# Patient Record
Sex: Female | Born: 1955 | Race: Black or African American | Hispanic: No | Marital: Married | Smoking: Never smoker
Health system: Southern US, Community
[De-identification: ages and names within clinical notes are randomized; demographics above are authoritative.]

## PROBLEM LIST (undated history)

## (undated) DIAGNOSIS — I219 Acute myocardial infarction, unspecified: Secondary | ICD-10-CM

## (undated) DIAGNOSIS — E785 Hyperlipidemia, unspecified: Secondary | ICD-10-CM

## (undated) DIAGNOSIS — I10 Essential (primary) hypertension: Secondary | ICD-10-CM

## (undated) DIAGNOSIS — I2510 Atherosclerotic heart disease of native coronary artery without angina pectoris: Secondary | ICD-10-CM

## (undated) DIAGNOSIS — E119 Type 2 diabetes mellitus without complications: Secondary | ICD-10-CM

## (undated) DIAGNOSIS — I499 Cardiac arrhythmia, unspecified: Secondary | ICD-10-CM

## (undated) DIAGNOSIS — I4891 Unspecified atrial fibrillation: Secondary | ICD-10-CM

## (undated) DIAGNOSIS — I509 Heart failure, unspecified: Secondary | ICD-10-CM

## (undated) HISTORY — PX: TUBAL LIGATION: SHX77

---

## 1999-07-18 ENCOUNTER — Emergency Department (HOSPITAL_COMMUNITY): Admission: EM | Admit: 1999-07-18 | Discharge: 1999-07-18 | Payer: Self-pay | Admitting: Emergency Medicine

## 2000-01-05 ENCOUNTER — Encounter: Admission: RE | Admit: 2000-01-05 | Discharge: 2000-01-05 | Payer: Self-pay | Admitting: Family Medicine

## 2000-01-05 ENCOUNTER — Encounter: Payer: Self-pay | Admitting: Family Medicine

## 2001-01-08 ENCOUNTER — Encounter: Admission: RE | Admit: 2001-01-08 | Discharge: 2001-01-08 | Payer: Self-pay | Admitting: Family Medicine

## 2001-01-08 ENCOUNTER — Encounter: Payer: Self-pay | Admitting: Family Medicine

## 2002-01-24 ENCOUNTER — Encounter: Admission: RE | Admit: 2002-01-24 | Discharge: 2002-01-24 | Payer: Self-pay | Admitting: Family Medicine

## 2002-01-24 ENCOUNTER — Other Ambulatory Visit: Admission: RE | Admit: 2002-01-24 | Discharge: 2002-01-24 | Payer: Self-pay | Admitting: Family Medicine

## 2002-01-24 ENCOUNTER — Encounter: Payer: Self-pay | Admitting: Family Medicine

## 2002-11-19 ENCOUNTER — Encounter: Admission: RE | Admit: 2002-11-19 | Discharge: 2003-02-17 | Payer: Self-pay | Admitting: *Deleted

## 2003-04-07 ENCOUNTER — Encounter: Admission: RE | Admit: 2003-04-07 | Discharge: 2003-07-06 | Payer: Self-pay | Admitting: *Deleted

## 2003-05-29 ENCOUNTER — Encounter: Admission: RE | Admit: 2003-05-29 | Discharge: 2003-05-29 | Payer: Self-pay | Admitting: Family Medicine

## 2003-05-29 ENCOUNTER — Encounter: Payer: Self-pay | Admitting: Family Medicine

## 2004-06-22 ENCOUNTER — Encounter: Admission: RE | Admit: 2004-06-22 | Discharge: 2004-06-22 | Payer: Self-pay | Admitting: Family Medicine

## 2005-02-16 ENCOUNTER — Other Ambulatory Visit: Admission: RE | Admit: 2005-02-16 | Discharge: 2005-02-16 | Payer: Self-pay | Admitting: Family Medicine

## 2005-09-12 ENCOUNTER — Encounter: Admission: RE | Admit: 2005-09-12 | Discharge: 2005-09-12 | Payer: Self-pay | Admitting: Family Medicine

## 2006-09-12 ENCOUNTER — Encounter: Admission: RE | Admit: 2006-09-12 | Discharge: 2006-09-12 | Payer: Self-pay | Admitting: Family Medicine

## 2007-09-17 ENCOUNTER — Encounter: Admission: RE | Admit: 2007-09-17 | Discharge: 2007-09-17 | Payer: Self-pay | Admitting: Family Medicine

## 2008-09-08 ENCOUNTER — Encounter: Admission: RE | Admit: 2008-09-08 | Discharge: 2008-09-08 | Payer: Self-pay | Admitting: Family Medicine

## 2009-09-13 ENCOUNTER — Encounter: Admission: RE | Admit: 2009-09-13 | Discharge: 2009-09-13 | Payer: Self-pay | Admitting: Family Medicine

## 2010-01-13 ENCOUNTER — Other Ambulatory Visit: Admission: RE | Admit: 2010-01-13 | Discharge: 2010-01-13 | Payer: Self-pay | Admitting: Family Medicine

## 2010-09-14 ENCOUNTER — Encounter: Admission: RE | Admit: 2010-09-14 | Discharge: 2010-09-14 | Payer: Self-pay | Admitting: Family Medicine

## 2011-06-23 ENCOUNTER — Other Ambulatory Visit: Payer: Self-pay | Admitting: Gastroenterology

## 2011-08-11 ENCOUNTER — Other Ambulatory Visit: Payer: Self-pay | Admitting: Family Medicine

## 2011-08-11 DIAGNOSIS — Z1231 Encounter for screening mammogram for malignant neoplasm of breast: Secondary | ICD-10-CM

## 2011-09-15 ENCOUNTER — Ambulatory Visit: Payer: Self-pay

## 2011-09-22 ENCOUNTER — Ambulatory Visit
Admission: RE | Admit: 2011-09-22 | Discharge: 2011-09-22 | Disposition: A | Discharge status: Home / Self Care | Payer: Managed Care, Other (non HMO) | Source: Ambulatory Visit | Attending: Family Medicine | Admitting: Family Medicine

## 2011-09-22 DIAGNOSIS — Z1231 Encounter for screening mammogram for malignant neoplasm of breast: Secondary | ICD-10-CM

## 2012-12-09 ENCOUNTER — Other Ambulatory Visit: Payer: Self-pay | Admitting: Family Medicine

## 2012-12-09 DIAGNOSIS — Z1231 Encounter for screening mammogram for malignant neoplasm of breast: Secondary | ICD-10-CM

## 2012-12-23 ENCOUNTER — Ambulatory Visit
Admission: RE | Admit: 2012-12-23 | Discharge: 2012-12-23 | Disposition: A | Discharge status: Home / Self Care | Payer: Managed Care, Other (non HMO) | Source: Ambulatory Visit | Attending: Family Medicine | Admitting: Family Medicine

## 2012-12-23 DIAGNOSIS — Z1231 Encounter for screening mammogram for malignant neoplasm of breast: Secondary | ICD-10-CM

## 2013-12-24 ENCOUNTER — Other Ambulatory Visit: Payer: Self-pay

## 2013-12-24 DIAGNOSIS — Z1231 Encounter for screening mammogram for malignant neoplasm of breast: Secondary | ICD-10-CM

## 2014-01-15 ENCOUNTER — Inpatient Hospital Stay: Admission: RE | Admit: 2014-01-15 | Payer: Managed Care, Other (non HMO) | Source: Ambulatory Visit

## 2014-03-20 ENCOUNTER — Ambulatory Visit
Admission: RE | Admit: 2014-03-20 | Discharge: 2014-03-20 | Disposition: A | Discharge status: Home / Self Care | Payer: Managed Care, Other (non HMO) | Source: Ambulatory Visit

## 2014-03-20 DIAGNOSIS — Z1231 Encounter for screening mammogram for malignant neoplasm of breast: Secondary | ICD-10-CM

## 2014-03-25 ENCOUNTER — Other Ambulatory Visit: Payer: Self-pay | Admitting: Family Medicine

## 2014-03-25 DIAGNOSIS — R928 Other abnormal and inconclusive findings on diagnostic imaging of breast: Secondary | ICD-10-CM

## 2014-04-02 ENCOUNTER — Ambulatory Visit
Admission: RE | Admit: 2014-04-02 | Discharge: 2014-04-02 | Disposition: A | Discharge status: Home / Self Care | Payer: Managed Care, Other (non HMO) | Source: Ambulatory Visit | Attending: Family Medicine | Admitting: Family Medicine

## 2014-04-02 DIAGNOSIS — R928 Other abnormal and inconclusive findings on diagnostic imaging of breast: Secondary | ICD-10-CM

## 2015-04-19 ENCOUNTER — Other Ambulatory Visit: Payer: Self-pay

## 2015-04-19 DIAGNOSIS — Z1231 Encounter for screening mammogram for malignant neoplasm of breast: Secondary | ICD-10-CM

## 2015-05-04 ENCOUNTER — Ambulatory Visit
Admission: RE | Admit: 2015-05-04 | Discharge: 2015-05-04 | Disposition: A | Discharge status: Home / Self Care | Payer: 59 | Source: Ambulatory Visit

## 2015-05-04 DIAGNOSIS — Z1231 Encounter for screening mammogram for malignant neoplasm of breast: Secondary | ICD-10-CM

## 2016-03-27 ENCOUNTER — Other Ambulatory Visit: Payer: Self-pay

## 2016-03-27 DIAGNOSIS — Z1231 Encounter for screening mammogram for malignant neoplasm of breast: Secondary | ICD-10-CM

## 2016-05-04 ENCOUNTER — Ambulatory Visit
Admission: RE | Admit: 2016-05-04 | Discharge: 2016-05-04 | Disposition: A | Discharge status: Home / Self Care | Payer: Managed Care, Other (non HMO) | Source: Ambulatory Visit

## 2016-05-04 DIAGNOSIS — Z1231 Encounter for screening mammogram for malignant neoplasm of breast: Secondary | ICD-10-CM

## 2016-11-09 ENCOUNTER — Emergency Department (HOSPITAL_COMMUNITY): Payer: Managed Care, Other (non HMO)

## 2016-11-09 ENCOUNTER — Emergency Department (HOSPITAL_COMMUNITY)
Admission: EM | Admit: 2016-11-09 | Discharge: 2016-11-09 | Disposition: A | Discharge status: Home / Self Care | Payer: Managed Care, Other (non HMO) | Attending: Emergency Medicine | Admitting: Emergency Medicine

## 2016-11-09 ENCOUNTER — Encounter (HOSPITAL_COMMUNITY): Payer: Self-pay | Admitting: Emergency Medicine

## 2016-11-09 DIAGNOSIS — I10 Essential (primary) hypertension: Secondary | ICD-10-CM | POA: Insufficient documentation

## 2016-11-09 DIAGNOSIS — Y939 Activity, unspecified: Secondary | ICD-10-CM | POA: Insufficient documentation

## 2016-11-09 DIAGNOSIS — Y999 Unspecified external cause status: Secondary | ICD-10-CM | POA: Diagnosis not present

## 2016-11-09 DIAGNOSIS — W2201XA Walked into wall, initial encounter: Secondary | ICD-10-CM | POA: Insufficient documentation

## 2016-11-09 DIAGNOSIS — E119 Type 2 diabetes mellitus without complications: Secondary | ICD-10-CM | POA: Diagnosis not present

## 2016-11-09 DIAGNOSIS — S9031XA Contusion of right foot, initial encounter: Secondary | ICD-10-CM

## 2016-11-09 DIAGNOSIS — Y929 Unspecified place or not applicable: Secondary | ICD-10-CM | POA: Diagnosis not present

## 2016-11-09 DIAGNOSIS — S99921A Unspecified injury of right foot, initial encounter: Secondary | ICD-10-CM | POA: Diagnosis present

## 2016-11-09 HISTORY — DX: Essential (primary) hypertension: I10

## 2016-11-09 HISTORY — DX: Type 2 diabetes mellitus without complications: E11.9

## 2016-11-09 MED ORDER — ONDANSETRON 8 MG PO TBDP: 8.0000 mg | ORAL_TABLET | Freq: Once | ORAL | Status: DC

## 2016-11-09 MED ORDER — IBUPROFEN 800 MG PO TABS: 800.0000 mg | ORAL_TABLET | Freq: Three times a day (TID) | ORAL | 0 refills | Status: DC | PRN

## 2016-11-09 NOTE — Discharge Instructions (Signed)
Return here as needed.  Follow-up with your primary care doctor.  The x-rays did not show any broken bones.  Ice and elevate your foot

## 2016-11-09 NOTE — ED Provider Notes (Signed)
[facility]-EMERGENCY DEPT Provider Note   CSN: [id number] Arrival date & time: 11/09/16  1716  By signing my name below, I, Clovis Pu, attest that this documentation has been prepared under the direction and in the presence of  [place], PA-C. Electronically Signed: Clovis Pu, ED Scribe. 11/09/16. 6:08 PM.  History   Chief Complaint Chief Complaint  Patient presents with  . Foot Pain    Right    The history is provided by the patient. No language interpreter was used.   HPI Comments:  Rachael Davis is a 60 y.o. female, with a hx of DM and HTN, who presents to the Emergency Department complaining of sudden onset, moderate right foot pain s/p a fall which occurred yesterday. Pt states she fell and hit her right foot against a wall. Her pain is worse upon palpation. No alleviating factors noted. Pt denies any other associated symptoms and modifying factors at this time.   Past Medical History:  Diagnosis Date  . Diabetes (HCC)   . Hypertension     There are no active problems to display for this patient.   Past Surgical History:  Procedure Laterality Date  . TUBAL LIGATION      OB History    No data available       Home Medications    Prior to Admission medications   Not on File    Family History History reviewed. No pertinent family history.  Social History Social History  Substance Use Topics  . Smoking status: Never Smoker  . Smokeless tobacco: Never Used  . Alcohol use No     Allergies   Patient has no allergy information on record.   Review of Systems Review of Systems  Musculoskeletal: Positive for arthralgias, joint swelling and myalgias.  Neurological: Negative for numbness.   Physical Exam Updated Vital Signs BP 148/92 (BP Location: Left Arm)   Pulse 88   Temp 99 F (37.2 C) (Oral)   Resp 18   Ht 5\' 3"  (1.6 m)   Wt 204 lb (92.5 kg)   SpO2 100%   BMI 36.14 kg/m   Physical Exam  Constitutional: She is oriented to person,  place, and time. She appears well-developed and well-nourished. No distress.  HENT:  Head: Normocephalic and atraumatic.  Eyes: Conjunctivae are normal.  Cardiovascular: Normal rate.   Pulmonary/Chest: Effort normal.  Abdominal: She exhibits no distension.  Musculoskeletal: She exhibits edema and tenderness.  Swelling at base of 4th and 5th toes with TTP of dorsum of foot.   Neurological: She is alert and oriented to person, place, and time.  Skin: Skin is warm and dry.  Psychiatric: She has a normal mood and affect.  Nursing note and vitals reviewed.    ED Treatments / Results  DIAGNOSTIC STUDIES:  Oxygen Saturation is 100% on RA, normal by my interpretation.    COORDINATION OF CARE:  6:01 PM Discussed treatment plan with pt at bedside and pt agreed to plan.  Labs (all labs ordered are listed, but only abnormal results are displayed) Labs Reviewed - No data to display  EKG  EKG Interpretation None       Radiology No results found.  Procedures Procedures (including critical care time)  Medications Ordered in ED Medications - No data to display   Initial Impression / Assessment and Plan / ED Course  I have reviewed the triage vital signs and the nursing notes.  Pertinent labs & imaging results that were available during my care of  the patient were reviewed by me and considered in my medical decision making (see chart for details).  Clinical Course     Patient X-Ray negative for obvious fracture or dislocation.  Pt advised to follow up with orthopedics. Patient given ace wrap while in ED, conservative therapy recommended and discussed. Patient will be discharged home & is agreeable with above plan. Returns precautions discussed. Pt appears safe for discharge.  Final Clinical Impressions(s) / ED Diagnoses   Final diagnoses:  None    New Prescriptions New Prescriptions   No medications on file  I personally performed the services described in this  documentation, which was scribed in my presence. The recorded information has been reviewed and is accurate.     Charlestine NightChristopher Ryosuke Ericksen, PA-C 11/09/16 1936    Jacalyn LefevreJulie Haviland, MD 11/09/16 [phone]

## 2016-11-09 NOTE — ED Notes (Signed)
Patient transported to X-ray 

## 2016-11-09 NOTE — ED Triage Notes (Signed)
Pt c/o R. Sided foot pain. States she hit it against a wall last Tuesday. Endorses taking Advil w/ no relief. Pt able to ambulate to triage. NAD noted.

## 2017-04-04 ENCOUNTER — Emergency Department (HOSPITAL_COMMUNITY): Payer: Managed Care, Other (non HMO)

## 2017-04-04 ENCOUNTER — Inpatient Hospital Stay (HOSPITAL_COMMUNITY)
Admission: EM | Admit: 2017-04-04 | Discharge: 2017-04-21 | DRG: 216 | Disposition: A | Discharge status: Home Health | Payer: Managed Care, Other (non HMO) | Attending: Cardiothoracic Surgery | Admitting: Cardiothoracic Surgery

## 2017-04-04 ENCOUNTER — Encounter (HOSPITAL_COMMUNITY)
Admission: EM | Disposition: A | Discharge status: Home Health | Payer: Self-pay | Source: Home / Self Care | Attending: Cardiothoracic Surgery

## 2017-04-04 ENCOUNTER — Encounter (HOSPITAL_COMMUNITY): Payer: Self-pay | Admitting: Emergency Medicine

## 2017-04-04 DIAGNOSIS — I2510 Atherosclerotic heart disease of native coronary artery without angina pectoris: Secondary | ICD-10-CM | POA: Diagnosis present

## 2017-04-04 DIAGNOSIS — Z8249 Family history of ischemic heart disease and other diseases of the circulatory system: Secondary | ICD-10-CM

## 2017-04-04 DIAGNOSIS — Z79899 Other long term (current) drug therapy: Secondary | ICD-10-CM | POA: Diagnosis not present

## 2017-04-04 DIAGNOSIS — R05 Cough: Secondary | ICD-10-CM | POA: Diagnosis not present

## 2017-04-04 DIAGNOSIS — E1169 Type 2 diabetes mellitus with other specified complication: Secondary | ICD-10-CM | POA: Diagnosis present

## 2017-04-04 DIAGNOSIS — Z01810 Encounter for preprocedural cardiovascular examination: Secondary | ICD-10-CM | POA: Diagnosis not present

## 2017-04-04 DIAGNOSIS — Z951 Presence of aortocoronary bypass graft: Secondary | ICD-10-CM

## 2017-04-04 DIAGNOSIS — I10 Essential (primary) hypertension: Secondary | ICD-10-CM | POA: Diagnosis present

## 2017-04-04 DIAGNOSIS — E11649 Type 2 diabetes mellitus with hypoglycemia without coma: Secondary | ICD-10-CM | POA: Diagnosis not present

## 2017-04-04 DIAGNOSIS — I5043 Acute on chronic combined systolic (congestive) and diastolic (congestive) heart failure: Secondary | ICD-10-CM | POA: Diagnosis not present

## 2017-04-04 DIAGNOSIS — Z7984 Long term (current) use of oral hypoglycemic drugs: Secondary | ICD-10-CM | POA: Diagnosis not present

## 2017-04-04 DIAGNOSIS — J9811 Atelectasis: Secondary | ICD-10-CM

## 2017-04-04 DIAGNOSIS — I255 Ischemic cardiomyopathy: Secondary | ICD-10-CM | POA: Diagnosis present

## 2017-04-04 DIAGNOSIS — I110 Hypertensive heart disease with heart failure: Secondary | ICD-10-CM | POA: Diagnosis not present

## 2017-04-04 DIAGNOSIS — I959 Hypotension, unspecified: Secondary | ICD-10-CM | POA: Diagnosis not present

## 2017-04-04 DIAGNOSIS — E785 Hyperlipidemia, unspecified: Secondary | ICD-10-CM | POA: Diagnosis present

## 2017-04-04 DIAGNOSIS — Z23 Encounter for immunization: Secondary | ICD-10-CM | POA: Diagnosis not present

## 2017-04-04 DIAGNOSIS — E1121 Type 2 diabetes mellitus with diabetic nephropathy: Secondary | ICD-10-CM | POA: Diagnosis present

## 2017-04-04 DIAGNOSIS — I2121 ST elevation (STEMI) myocardial infarction involving left circumflex coronary artery: Secondary | ICD-10-CM | POA: Diagnosis present

## 2017-04-04 DIAGNOSIS — D72829 Elevated white blood cell count, unspecified: Secondary | ICD-10-CM | POA: Diagnosis not present

## 2017-04-04 DIAGNOSIS — I340 Nonrheumatic mitral (valve) insufficiency: Secondary | ICD-10-CM | POA: Diagnosis present

## 2017-04-04 DIAGNOSIS — Z952 Presence of prosthetic heart valve: Secondary | ICD-10-CM

## 2017-04-04 DIAGNOSIS — Z6837 Body mass index (BMI) 37.0-37.9, adult: Secondary | ICD-10-CM

## 2017-04-04 DIAGNOSIS — E876 Hypokalemia: Secondary | ICD-10-CM | POA: Diagnosis not present

## 2017-04-04 DIAGNOSIS — I213 ST elevation (STEMI) myocardial infarction of unspecified site: Secondary | ICD-10-CM | POA: Diagnosis not present

## 2017-04-04 DIAGNOSIS — N170 Acute kidney failure with tubular necrosis: Secondary | ICD-10-CM | POA: Diagnosis not present

## 2017-04-04 DIAGNOSIS — R079 Chest pain, unspecified: Secondary | ICD-10-CM

## 2017-04-04 DIAGNOSIS — E669 Obesity, unspecified: Secondary | ICD-10-CM | POA: Diagnosis present

## 2017-04-04 DIAGNOSIS — I25110 Atherosclerotic heart disease of native coronary artery with unstable angina pectoris: Secondary | ICD-10-CM | POA: Diagnosis not present

## 2017-04-04 DIAGNOSIS — I459 Conduction disorder, unspecified: Secondary | ICD-10-CM | POA: Diagnosis not present

## 2017-04-04 DIAGNOSIS — I5041 Acute combined systolic (congestive) and diastolic (congestive) heart failure: Secondary | ICD-10-CM | POA: Diagnosis not present

## 2017-04-04 DIAGNOSIS — E7800 Pure hypercholesterolemia, unspecified: Secondary | ICD-10-CM | POA: Diagnosis not present

## 2017-04-04 DIAGNOSIS — R0602 Shortness of breath: Secondary | ICD-10-CM

## 2017-04-04 HISTORY — PX: LEFT HEART CATH AND CORONARY ANGIOGRAPHY: CATH118249

## 2017-04-04 LAB — CBC
HCT: 41.8 % (ref 36.0–46.0)
Hemoglobin: 13.5 g/dL (ref 12.0–15.0)
MCH: 23.9 pg — ABNORMAL LOW (ref 26.0–34.0)
MCHC: 32.3 g/dL (ref 30.0–36.0)
MCV: 74.1 fL — ABNORMAL LOW (ref 78.0–100.0)
Platelets: 199 10*3/uL (ref 150–400)
RBC: 5.64 MIL/uL — ABNORMAL HIGH (ref 3.87–5.11)
RDW: 16.2 % — ABNORMAL HIGH (ref 11.5–15.5)
WBC: 12 10*3/uL — ABNORMAL HIGH (ref 4.0–10.5)

## 2017-04-04 LAB — BASIC METABOLIC PANEL
Anion gap: 12 (ref 5–15)
BUN: 25 mg/dL — ABNORMAL HIGH (ref 6–20)
CO2: 25 mmol/L (ref 22–32)
Calcium: 9.5 mg/dL (ref 8.9–10.3)
Chloride: 99 mmol/L — ABNORMAL LOW (ref 101–111)
Creatinine, Ser: 1.6 mg/dL — ABNORMAL HIGH (ref 0.44–1.00)
GFR calc Af Amer: 39 mL/min — ABNORMAL LOW (ref >60)
GFR calc non Af Amer: 34 mL/min — ABNORMAL LOW (ref >60)
Glucose, Bld: 209 mg/dL — ABNORMAL HIGH (ref 65–99)
Potassium: 4.5 mmol/L (ref 3.5–5.1)
Sodium: 136 mmol/L (ref 135–145)

## 2017-04-04 LAB — LIPID PANEL
Cholesterol: 181 mg/dL (ref 0–200)
HDL: 55 mg/dL (ref >40)
LDL Cholesterol: 115 mg/dL — ABNORMAL HIGH (ref 0–99)
Total CHOL/HDL Ratio: 3.3 RATIO
Triglycerides: 56 mg/dL (ref <150)
VLDL: 11 mg/dL (ref 0–40)

## 2017-04-04 LAB — APTT: aPTT: 27 seconds (ref 24–36)

## 2017-04-04 LAB — GLUCOSE, CAPILLARY: Glucose-Capillary: 188 mg/dL — ABNORMAL HIGH (ref 65–99)

## 2017-04-04 LAB — I-STAT TROPONIN, ED: Troponin i, poc: 0.5 ng/mL (ref 0.00–0.08)

## 2017-04-04 LAB — TROPONIN I: Troponin I: 0.6 ng/mL (ref <0.03)

## 2017-04-04 LAB — PROTIME-INR
INR: 1.05
Prothrombin Time: 13.7 seconds (ref 11.4–15.2)

## 2017-04-04 SURGERY — LEFT HEART CATH AND CORONARY ANGIOGRAPHY
Anesthesia: LOCAL

## 2017-04-04 MED ORDER — HEPARIN SODIUM (PORCINE) 5000 UNIT/ML IJ SOLN
4000.0000 [IU] | Freq: Once | INTRAMUSCULAR | Status: AC
  Administered 2017-04-04: 4000 [IU] via INTRAVENOUS
  Filled 2017-04-04: qty 1

## 2017-04-04 MED ORDER — NITROGLYCERIN 0.4 MG SL SUBL
0.4000 mg | SUBLINGUAL_TABLET | SUBLINGUAL | Status: DC | PRN
  Administered 2017-04-04: 0.4 mg via SUBLINGUAL
  Filled 2017-04-04: qty 1

## 2017-04-04 MED ORDER — FENTANYL CITRATE (PF) 100 MCG/2ML IJ SOLN
INTRAMUSCULAR | Status: AC
  Filled 2017-04-04: qty 2

## 2017-04-04 MED ORDER — HEPARIN (PORCINE) IN NACL 2-0.9 UNIT/ML-% IJ SOLN
INTRAMUSCULAR | Status: DC | PRN
  Administered 2017-04-04: 1000 mL

## 2017-04-04 MED ORDER — FENTANYL CITRATE (PF) 100 MCG/2ML IJ SOLN
INTRAMUSCULAR | Status: DC | PRN
  Administered 2017-04-04: 25 ug via INTRAVENOUS

## 2017-04-04 MED ORDER — IOPAMIDOL (ISOVUE-370) INJECTION 76%
INTRAVENOUS | Status: DC | PRN
  Administered 2017-04-04: 75 mL via INTRA_ARTERIAL

## 2017-04-04 MED ORDER — VERAPAMIL HCL 2.5 MG/ML IV SOLN
INTRAVENOUS | Status: AC
  Filled 2017-04-04: qty 2

## 2017-04-04 MED ORDER — MIDAZOLAM HCL 2 MG/2ML IJ SOLN
INTRAMUSCULAR | Status: DC | PRN
  Administered 2017-04-04: 1 mg via INTRAVENOUS

## 2017-04-04 MED ORDER — ASPIRIN 81 MG PO CHEW: 81.0000 mg | CHEWABLE_TABLET | Freq: Every day | ORAL | Status: DC

## 2017-04-04 MED ORDER — NITROGLYCERIN 0.4 MG SL SUBL: 0.4000 mg | SUBLINGUAL_TABLET | SUBLINGUAL | Status: DC | PRN

## 2017-04-04 MED ORDER — LIDOCAINE HCL (PF) 1 % IJ SOLN
INTRAMUSCULAR | Status: DC | PRN
  Administered 2017-04-04: 2 mL

## 2017-04-04 MED ORDER — LIDOCAINE HCL (PF) 1 % IJ SOLN
INTRAMUSCULAR | Status: AC
  Filled 2017-04-04: qty 30

## 2017-04-04 MED ORDER — ONDANSETRON HCL 4 MG/2ML IJ SOLN
4.0000 mg | Freq: Four times a day (QID) | INTRAMUSCULAR | Status: DC | PRN
  Administered 2017-04-05: 4 mg via INTRAVENOUS
  Filled 2017-04-04: qty 2

## 2017-04-04 MED ORDER — SODIUM CHLORIDE 0.9 % WEIGHT BASED INFUSION: 1.0000 mL/kg/h | INTRAVENOUS | Status: AC

## 2017-04-04 MED ORDER — ASPIRIN EC 81 MG PO TBEC
81.0000 mg | DELAYED_RELEASE_TABLET | Freq: Every day | ORAL | Status: DC
  Administered 2017-04-05 – 2017-04-11 (×7): 81 mg via ORAL
  Filled 2017-04-04 (×7): qty 1

## 2017-04-04 MED ORDER — ASPIRIN 81 MG PO CHEW
324.0000 mg | CHEWABLE_TABLET | Freq: Once | ORAL | Status: AC
  Administered 2017-04-04: 324 mg via ORAL
  Filled 2017-04-04: qty 4

## 2017-04-04 MED ORDER — INSULIN ASPART 100 UNIT/ML ~~LOC~~ SOLN
0.0000 [IU] | Freq: Three times a day (TID) | SUBCUTANEOUS | Status: DC
  Administered 2017-04-05: 3 [IU] via SUBCUTANEOUS
  Administered 2017-04-05: 5 [IU] via SUBCUTANEOUS
  Administered 2017-04-05: 2 [IU] via SUBCUTANEOUS
  Administered 2017-04-06: 5 [IU] via SUBCUTANEOUS
  Administered 2017-04-06: 2 [IU] via SUBCUTANEOUS
  Administered 2017-04-06: 3 [IU] via SUBCUTANEOUS
  Administered 2017-04-07: 2 [IU] via SUBCUTANEOUS
  Administered 2017-04-07: 5 [IU] via SUBCUTANEOUS
  Administered 2017-04-08: 2 [IU] via SUBCUTANEOUS
  Administered 2017-04-09 – 2017-04-10 (×5): 3 [IU] via SUBCUTANEOUS
  Administered 2017-04-11 (×3): 2 [IU] via SUBCUTANEOUS

## 2017-04-04 MED ORDER — MIDAZOLAM HCL 2 MG/2ML IJ SOLN
INTRAMUSCULAR | Status: AC
  Filled 2017-04-04: qty 2

## 2017-04-04 MED ORDER — SODIUM CHLORIDE 0.9 % IV SOLN
INTRAVENOUS | Status: DC
  Administered 2017-04-04 – 2017-04-06 (×3): via INTRAVENOUS

## 2017-04-04 MED ORDER — ATORVASTATIN CALCIUM 80 MG PO TABS
80.0000 mg | ORAL_TABLET | Freq: Every day | ORAL | Status: DC
  Administered 2017-04-05 – 2017-04-11 (×7): 80 mg via ORAL
  Filled 2017-04-04 (×7): qty 1

## 2017-04-04 MED ORDER — HEPARIN (PORCINE) IN NACL 2-0.9 UNIT/ML-% IJ SOLN
INTRAMUSCULAR | Status: AC
  Filled 2017-04-04: qty 1000

## 2017-04-04 MED ORDER — ASPIRIN 300 MG RE SUPP: 300.0000 mg | RECTAL | Status: DC

## 2017-04-04 MED ORDER — METOPROLOL TARTRATE 12.5 MG HALF TABLET: 25.0000 mg | ORAL_TABLET | Freq: Two times a day (BID) | ORAL | Status: DC

## 2017-04-04 MED ORDER — ASPIRIN 81 MG PO CHEW: 324.0000 mg | CHEWABLE_TABLET | ORAL | Status: DC

## 2017-04-04 MED ORDER — SODIUM CHLORIDE 0.9 % IV SOLN
250.0000 mL | INTRAVENOUS | Status: DC | PRN
Start: 2017-04-05 — End: 2017-04-12

## 2017-04-04 MED ORDER — NITROGLYCERIN 1 MG/10 ML FOR IR/CATH LAB
INTRA_ARTERIAL | Status: AC
  Filled 2017-04-04: qty 10

## 2017-04-04 MED ORDER — VERAPAMIL HCL 2.5 MG/ML IV SOLN
INTRAVENOUS | Status: DC | PRN
  Administered 2017-04-04: 10 mL via INTRA_ARTERIAL

## 2017-04-04 MED ORDER — HEPARIN (PORCINE) IN NACL 100-0.45 UNIT/ML-% IJ SOLN
1000.0000 [IU]/h | INTRAMUSCULAR | Status: DC
  Administered 2017-04-05: 1000 [IU]/h via INTRAVENOUS
  Filled 2017-04-04: qty 250

## 2017-04-04 MED ORDER — ACETAMINOPHEN 325 MG PO TABS: 650.0000 mg | ORAL_TABLET | ORAL | Status: DC | PRN

## 2017-04-04 MED ORDER — SODIUM CHLORIDE 0.9% FLUSH
3.0000 mL | Freq: Two times a day (BID) | INTRAVENOUS | Status: DC
  Administered 2017-04-05 – 2017-04-11 (×12): 3 mL via INTRAVENOUS

## 2017-04-04 MED ORDER — SODIUM CHLORIDE 0.9% FLUSH
3.0000 mL | INTRAVENOUS | Status: DC | PRN
  Administered 2017-04-07: 3 mL via INTRAVENOUS
  Filled 2017-04-04: qty 3

## 2017-04-04 SURGICAL SUPPLY — 11 items
CATH 5FR JL3.5 JR4 ANG PIG MP (CATHETERS) ×1 IMPLANT
DEVICE RAD COMP TR BAND LRG (VASCULAR PRODUCTS) ×1 IMPLANT
GLIDESHEATH SLEND SS 6F .021 (SHEATH) ×1 IMPLANT
GUIDEWIRE INQWIRE 1.5J.035X260 (WIRE) IMPLANT
INQWIRE 1.5J .035X260CM (WIRE) ×2
KIT ENCORE 26 ADVANTAGE (KITS) ×1 IMPLANT
KIT HEART LEFT (KITS) ×2 IMPLANT
PACK CARDIAC CATHETERIZATION (CUSTOM PROCEDURE TRAY) ×2 IMPLANT
SYR MEDRAD MARK V 150ML (SYRINGE) ×2 IMPLANT
TRANSDUCER W/STOPCOCK (MISCELLANEOUS) ×2 IMPLANT
TUBING CIL FLEX 10 FLL-RA (TUBING) ×2 IMPLANT

## 2017-04-04 NOTE — ED Triage Notes (Addendum)
Pt transported from , onset of SHOB with dizziness while at work. Pt then began having burning in chest with no radiation onset 1730. EMS unable to est IV.  While at home pt had emesis x 2.  Room air 02 99%

## 2017-04-04 NOTE — ED Notes (Signed)
MD issacs and Cardiologist currently at the bedside.

## 2017-04-04 NOTE — ED Notes (Signed)
Pt continues to deny pain/burning

## 2017-04-04 NOTE — Progress Notes (Signed)
ANTICOAGULATION CONSULT NOTE - Initial Consult  Pharmacy Consult for Heparin Indication: 3V CAD  No Known Allergies  Patient Measurements: Height: 5\' 3"  (160 cm) Weight: 210 lb (95.3 kg) IBW/kg (Calculated) : 52.4 Heparin Dosing Weight: 74 kg  Vital Signs: Temp: 98.1 F (36.7 C) (05/02 2050) Temp Source: Oral (05/02 2050) BP: 111/70 (05/02 2225) Pulse Rate: 78 (05/02 2225)  Labs:  Recent Labs  04/04/17 2042  HGB 13.5  HCT 41.8  PLT 199  APTT 27  LABPROT 13.7  INR 1.05  CREATININE 1.60*  TROPONINI 0.60*    Estimated Creatinine Clearance: 40.6 mL/min (A) (by C-G formula based on SCr of 1.6 mg/dL (H)).   Medical History: Past Medical History:  Diagnosis Date  . Diabetes (HCC)   . Hypertension     Medications:  Prescriptions Prior to Admission  Medication Sig Dispense Refill Last Dose  . ibuprofen (ADVIL,MOTRIN) 800 MG tablet Take 1 tablet (800 mg total) by mouth every 8 (eight) hours as needed. 21 tablet 0     Assessment: 61 y.o. F presents with CP. s/p cath which showed 3V CAD - likely will need CABG. Plan to start heparin gtt 8 hours post sheath removal (sheath was removed ~2230). CBC ok on admission.  Goal of Therapy:  Heparin level 0.3-0.7 units/ml Monitor platelets by anticoagulation protocol: Yes   Plan:  At 0630 (8hr post sheath removal), start heparin gtt at 1000 units/hr Will f/u heparin level 6 hours post start Daily heparin level and CBC  Christoper Fabian, PharmD, BCPS Clinical pharmacist, pager [phone] 04/04/2017,10:52 PM

## 2017-04-04 NOTE — Progress Notes (Signed)
Pt received from cath lab into 2H23, report from cath lab RN via telephone (11 cc in TR band, placed at 2225). RN at bedside, pt denies chest pain/discomfort/burning at this time, vital signs stable, pt education given about cath site and elevating extremity. Husband and daughter at bedside - all questions answered at this time. Contact information in pt's chart. Will continue to monitor pt closely.

## 2017-04-04 NOTE — ED Notes (Signed)
Pt did have burning in chest but no pain prior to medication, after nitro x 1 pt is now pain/burning free

## 2017-04-04 NOTE — ED Notes (Signed)
PCXR at bedside.

## 2017-04-04 NOTE — H&P (Signed)
CARDIOLOGY INPATIENT HISTORY AND PHYSICAL EXAMINATION NOTE  Patient ID: Rachael Davis MRN: [id number], DOB/AGE: 03/07/1956   Admit date: 04/04/2017  Primary Physician: No PCP Per Patient Primary Cardiologist: new  Reason for admission: heart burn  HPI: This is a 61 y.o. AA female with no prior history of CAD, HTN, HLD, long standing DM2 and diabetic nephropathy presented with heart burn and chest pain which started around 5:30 pm tonight. 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 Initially she became SOB, diaphoretic, nauseas and then developed heart burn which improved with 1 x NTG in the ED. Patientw as hemodynamically stable and was given asa in the ED. No prior cath/stress tests. Family hx sig for father dying with MI in 94s.    Problem List: Past Medical History:  Diagnosis Date  . Diabetes (HCC)   . Hypertension     Past Surgical History:  Procedure Laterality Date  . TUBAL LIGATION       Allergies: No Known Allergies   Home Medications Current Facility-Administered Medications  Medication Dose Route Frequency Provider Last Rate Last Dose  . 0.9 %  sodium chloride infusion   Intravenous Continuous Shaune Pollack, MD 10 mL/hr at 04/04/17 2130    . heparin infusion 2 units/mL in 0.9 % sodium chloride    Continuous PRN Peter M [name], MD   1,000 mL at 04/04/17 2201  . lidocaine (PF) (XYLOCAINE) 1 % injection    PRN Peter M [name], MD   2 mL at 04/04/17 2205  . [MAR Hold] nitroGLYCERIN (NITROSTAT) SL tablet 0.4 mg  0.4 mg Sublingual Q5 min PRN Shaune Pollack, MD   0.4 mg at 04/04/17 2120  . Radial Cocktail/Verapamil only    PRN Peter M [name], MD   10 mL at 04/04/17 2205     Family History  Problem Relation Age of Onset  . Heart attack Father 87     Social History   Social History  . Marital status: Married    Spouse name: N/A  . Number of children: N/A  . Years of education: N/A   Occupational History  . Not on file.   Social History Main  Topics  . Smoking status: Never Smoker  . Smokeless tobacco: Never Used  . Alcohol use No  . Drug use: No  . Sexual activity: Not on file   Other Topics Concern  . Not on file   Social History Narrative  . No narrative on file     Review of Systems: General: recent cough, no weight changes Cardiovascular: chest pain, dyspnea no symptoms of CHF Dermatological: negative for rash Respiratory: negative for cough or wheezing Urologic: negative for hematuria Abdominal: negative for nausea, vomiting, diarrhea, bright red blood per rectum, melena, or hematemesis Neurologic: negative for visual changes, syncope, or dizziness Endocrine: no diabetes, no hypothyroidism Immunological: no lymph adenopathy Psych: non homicidal/suicidal  Physical Exam: Vitals: BP (!) 108/59   Pulse 91   Temp 98.1 F (36.7 C) (Oral)   Resp (!) 22   Ht 5\' 3"  (1.6 m)   Wt 95.3 kg (210 lb)   SpO2 97%   BMI 37.20 kg/m  General: not in acute distress Neck: JVP flat, neck supple Heart: regular rate and rhythm, S1, S2, no murmurs  Lungs: CTAB  GI: non tender, non distended, bowel sounds present Extremities: no edema Neuro: AAO x 3  Psych: normal affect, no anxiety   Labs:   Results for orders placed or performed during the hospital encounter  of 04/04/17 (from the past 24 hour(s))  Basic metabolic panel     Status: Abnormal   Collection Time: 04/04/17  8:42 PM  Result Value Ref Range   Sodium 136 135 - 145 mmol/L   Potassium 4.5 3.5 - 5.1 mmol/L   Chloride 99 (L) 101 - 111 mmol/L   CO2 25 22 - 32 mmol/L   Glucose, Bld 209 (H) 65 - 99 mg/dL   BUN 25 (H) 6 - 20 mg/dL   Creatinine, Ser 8.11 (H) 0.44 - 1.00 mg/dL   Calcium 9.5 8.9 - 91.4 mg/dL   GFR calc non Af Amer 34 (L) >60 mL/min   GFR calc Af Amer 39 (L) >60 mL/min   Anion gap 12 5 - 15  CBC     Status: Abnormal   Collection Time: 04/04/17  8:42 PM  Result Value Ref Range   WBC 12.0 (H) 4.0 - 10.5 K/uL   RBC 5.64 (H) 3.87 - 5.11 MIL/uL    Hemoglobin 13.5 12.0 - 15.0 g/dL   HCT 78.2 95.6 - 21.3 %   MCV 74.1 (L) 78.0 - 100.0 fL   MCH 23.9 (L) 26.0 - 34.0 pg   MCHC 32.3 30.0 - 36.0 g/dL   RDW 08.6 (H) 57.8 - 46.9 %   Platelets 199 150 - 400 K/uL  Protime-INR     Status: None   Collection Time: 04/04/17  8:42 PM  Result Value Ref Range   Prothrombin Time 13.7 11.4 - 15.2 seconds   INR 1.05   APTT     Status: None   Collection Time: 04/04/17  8:42 PM  Result Value Ref Range   aPTT 27 24 - 36 seconds  I-stat troponin, ED     Status: Abnormal   Collection Time: 04/04/17  9:00 PM  Result Value Ref Range   Troponin i, poc 0.50 (HH) 0.00 - 0.08 ng/mL   Comment NOTIFIED PHYSICIAN    Comment 3             Radiology/Studies: Dg Chest Port 1 View  Result Date: 04/04/2017 CLINICAL DATA:  Shortness of breath and STEMI EXAM: PORTABLE CHEST 1 VIEW COMPARISON:  None. FINDINGS: Cardiac shadow is mildly enlarged.  The lungs are clear bilaterally. IMPRESSION: No active disease. Electronically Signed   By: Alcide Clever M.D.   On: 04/04/2017 21:47    EKG: normal sinus rhythm, fragmented QRS, PVCs, remote MI in the inferior leads and lateral ST elevation   Echo: not performed  Cardiac cath: see report  Medical decision making:  Discussed care with the patient, daughter and husband Discussed care with the physician in the ED Reviewed labs and imaging personally Reviewed prior records  ASSESSMENT AND PLAN:  This is a 61 y.o. 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

## 2017-04-04 NOTE — ED Provider Notes (Signed)
[facility]-EMERGENCY DEPT Provider Note   CSN: [id number] Arrival date & time: 04/04/17  2034     History   Chief Complaint Chief Complaint  Patient presents with  . Chest Pain    HPI Rachael Davis is a 61 y.o. female with DM and HTN who presents with sudden onset of burning substernal chest discomfort, diaphoresis, nausea, and dyspnea.  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  She then developed some substernal burning chest pain without radiation.  She vomited twice, partially digested food, non-bloody non-bilious, and felt some relief after vomiting.  She has had a cough and some nasal congestion for the past week, but otherwise has been well without chest pain, dyspnea, leg swelling, orthopnea, or exertional limitations.  HPI  Past Medical History:  Diagnosis Date  . Diabetes (HCC)   . Hypertension     Patient Active Problem List   Diagnosis Date Noted  . 3-vessel coronary artery disease 04/04/2017  . Essential hypertension 04/04/2017  . 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    Past Surgical History:  Procedure Laterality Date  . TUBAL LIGATION      OB History    No data available       Home Medications    Prior to Admission medications   Medication Sig Start Date End Date Taking? Authorizing Provider  ibuprofen (ADVIL,MOTRIN) 800 MG tablet Take 1 tablet (800 mg total) by mouth every 8 (eight) hours as needed. 11/09/16   Charlestine Night, PA-C    Family History Family History  Problem Relation Age of Onset  . Heart attack Father 23    Social History Social History  Substance Use Topics  . Smoking status: Never Smoker  . Smokeless tobacco: Never Used  . Alcohol use No     Allergies   Patient has no known allergies.   Review of  Systems Review of Systems  Constitutional: Positive for chills and diaphoresis.  Respiratory: Positive for cough, chest tightness and shortness of breath.   Cardiovascular: Positive for chest pain. Negative for palpitations and leg swelling.  Gastrointestinal: Positive for nausea and vomiting.  Neurological: Negative for dizziness and syncope.  Psychiatric/Behavioral: Negative for confusion. The patient is not nervous/anxious.      Physical Exam Updated Vital Signs BP 111/70   Pulse 78   Temp 98.1 F (36.7 C) (Oral)   Resp (!) 26   Ht 5\' 3"  (1.6 m)   Wt 95.3 kg   SpO2 100%   BMI 37.20 kg/m   Physical Exam  Constitutional: She is oriented to person, place, and time.  Tired, overweight woman lying in stretcher in no acute distress  HENT:  Head: Normocephalic and atraumatic.  Mouth/Throat: Oropharynx is clear and moist.  Eyes: Conjunctivae are normal. No scleral icterus.  Neck: Normal range of motion. Neck supple.  Cardiovascular: Normal rate, regular rhythm and normal heart sounds.   Pulmonary/Chest: Effort normal and breath sounds normal. No respiratory distress. She exhibits no tenderness.  Abdominal: Soft. She exhibits no distension. There is no tenderness.  Musculoskeletal: She exhibits no edema or tenderness.  Neurological: She is alert and oriented to person, place, and time.  Skin: Skin is warm and dry.  Psychiatric: She has a normal mood and affect. Her behavior is normal.     ED Treatments /  Results  Labs (all labs ordered are listed, but only abnormal results are displayed) Labs Reviewed  BASIC METABOLIC PANEL - Abnormal; Notable for the following:       Result Value   Chloride 99 (*)    Glucose, Bld 209 (*)    BUN 25 (*)    Creatinine, Ser 1.60 (*)    GFR calc non Af Amer 34 (*)    GFR calc Af Amer 39 (*)    All other components within normal limits  CBC - Abnormal; Notable for the following:    WBC 12.0 (*)    RBC 5.64 (*)    MCV 74.1 (*)    MCH  23.9 (*)    RDW 16.2 (*)    All other components within normal limits  TROPONIN I - Abnormal; Notable for the following:    Troponin I 0.60 (*)    All other components within normal limits  LIPID PANEL - Abnormal; Notable for the following:    LDL Cholesterol 115 (*)    All other components within normal limits  I-STAT TROPOININ, ED - Abnormal; Notable for the following:    Troponin i, poc 0.50 (*)    All other components within normal limits  MRSA PCR SCREENING  PROTIME-INR  APTT  BASIC METABOLIC PANEL  CBC  HIV ANTIBODY (ROUTINE TESTING)  HEMOGLOBIN A1C  COMPREHENSIVE METABOLIC PANEL  MAGNESIUM  TROPONIN I  TROPONIN I  TROPONIN I  LIPID PANEL    EKG  EKG Interpretation None       Radiology Dg Chest Port 1 View  Result Date: 04/04/2017 CLINICAL DATA:  Shortness of breath and STEMI EXAM: PORTABLE CHEST 1 VIEW COMPARISON:  None. FINDINGS: Cardiac shadow is mildly enlarged.  The lungs are clear bilaterally. IMPRESSION: No active disease. Electronically Signed   By: Alcide Clever M.D.   On: 04/04/2017 21:47    Procedures Procedures (including critical care time)  Medications Ordered in ED Medications  0.9 %  sodium chloride infusion ( Intravenous New Bag/Given 04/04/17 2130)  acetaminophen (TYLENOL) tablet 650 mg (not administered)  ondansetron (ZOFRAN) injection 4 mg (not administered)  aspirin EC tablet 81 mg (not administered)  nitroGLYCERIN (NITROSTAT) SL tablet 0.4 mg (not administered)  insulin aspart (novoLOG) injection 0-15 Units (not administered)  0.9% sodium chloride infusion (not administered)  sodium chloride flush (NS) 0.9 % injection 3 mL (not administered)  sodium chloride flush (NS) 0.9 % injection 3 mL (not administered)  0.9 %  sodium chloride infusion (not administered)  atorvastatin (LIPITOR) tablet 80 mg (not administered)  metoprolol tartrate (LOPRESSOR) tablet 25 mg (not administered)  aspirin chewable tablet 324 mg (324 mg Oral Given 04/04/17  2118)  heparin injection 4,000 Units (4,000 Units Intravenous Given 04/04/17 2119)     Initial Impression / Assessment and Plan / ED Course  I have reviewed the triage vital signs and the nursing notes.  Pertinent labs & imaging results that were available during my care of the patient were reviewed by me and considered in my medical decision making (see chart for details).  Sudden onset chest discomfort, dyspnea, diaphoresis, in diabetic woman with EKG changes and troponinemia concerning for STEMI and/or old infarction.  No active chest discomfort. -Code STEMI -Aspirin -Heparin -Repeat EKG -Nitro PRN     Final Clinical Impressions(s) / ED Diagnoses   Final diagnoses:  ST elevation myocardial infarction (STEMI), unspecified artery (HCC)   Emergent cardiac catheterization and admission for STEMI per cardiology.  New Prescriptions Current  Discharge Medication List       Alm Bustard, MD 04/04/17 2257    Shaune Pollack, MD 04/06/17 [phone]

## 2017-04-04 NOTE — ED Notes (Signed)
Cards at bedside

## 2017-04-05 ENCOUNTER — Other Ambulatory Visit: Payer: Self-pay

## 2017-04-05 ENCOUNTER — Other Ambulatory Visit: Payer: Self-pay | Admitting: *Deleted

## 2017-04-05 ENCOUNTER — Telehealth: Payer: Self-pay | Admitting: Cardiovascular Disease

## 2017-04-05 ENCOUNTER — Encounter (HOSPITAL_COMMUNITY): Payer: Self-pay | Admitting: Cardiology

## 2017-04-05 ENCOUNTER — Inpatient Hospital Stay (HOSPITAL_COMMUNITY): Payer: Managed Care, Other (non HMO)

## 2017-04-05 DIAGNOSIS — I25110 Atherosclerotic heart disease of native coronary artery with unstable angina pectoris: Secondary | ICD-10-CM

## 2017-04-05 DIAGNOSIS — I340 Nonrheumatic mitral (valve) insufficiency: Secondary | ICD-10-CM

## 2017-04-05 DIAGNOSIS — I10 Essential (primary) hypertension: Secondary | ICD-10-CM

## 2017-04-05 DIAGNOSIS — I255 Ischemic cardiomyopathy: Secondary | ICD-10-CM

## 2017-04-05 DIAGNOSIS — I2510 Atherosclerotic heart disease of native coronary artery without angina pectoris: Secondary | ICD-10-CM

## 2017-04-05 DIAGNOSIS — E7800 Pure hypercholesterolemia, unspecified: Secondary | ICD-10-CM

## 2017-04-05 DIAGNOSIS — I2121 ST elevation (STEMI) myocardial infarction involving left circumflex coronary artery: Secondary | ICD-10-CM

## 2017-04-05 DIAGNOSIS — I5041 Acute combined systolic (congestive) and diastolic (congestive) heart failure: Secondary | ICD-10-CM

## 2017-04-05 LAB — SURGICAL PCR SCREEN
MRSA, PCR: NEGATIVE
Staphylococcus aureus: NEGATIVE

## 2017-04-05 LAB — HEPARIN LEVEL (UNFRACTIONATED)
Heparin Unfractionated: 0.24 IU/mL — ABNORMAL LOW (ref 0.30–0.70)
Heparin Unfractionated: 0.36 IU/mL (ref 0.30–0.70)

## 2017-04-05 LAB — COMPREHENSIVE METABOLIC PANEL
ALT: 20 U/L (ref 14–54)
AST: 29 U/L (ref 15–41)
Albumin: 3.7 g/dL (ref 3.5–5.0)
Alkaline Phosphatase: 101 U/L (ref 38–126)
Anion gap: 12 (ref 5–15)
BUN: 25 mg/dL — ABNORMAL HIGH (ref 6–20)
CO2: 23 mmol/L (ref 22–32)
Calcium: 9.4 mg/dL (ref 8.9–10.3)
Chloride: 100 mmol/L — ABNORMAL LOW (ref 101–111)
Creatinine, Ser: 1.5 mg/dL — ABNORMAL HIGH (ref 0.44–1.00)
GFR calc Af Amer: 42 mL/min — ABNORMAL LOW (ref >60)
GFR calc non Af Amer: 36 mL/min — ABNORMAL LOW (ref >60)
Glucose, Bld: 209 mg/dL — ABNORMAL HIGH (ref 65–99)
Potassium: 4.3 mmol/L (ref 3.5–5.1)
Sodium: 135 mmol/L (ref 135–145)
Total Bilirubin: 0.6 mg/dL (ref 0.3–1.2)
Total Protein: 7.2 g/dL (ref 6.5–8.1)

## 2017-04-05 LAB — BASIC METABOLIC PANEL
Anion gap: 10 (ref 5–15)
BUN: 25 mg/dL — ABNORMAL HIGH (ref 6–20)
CO2: 22 mmol/L (ref 22–32)
Calcium: 8.8 mg/dL — ABNORMAL LOW (ref 8.9–10.3)
Chloride: 105 mmol/L (ref 101–111)
Creatinine, Ser: 1.43 mg/dL — ABNORMAL HIGH (ref 0.44–1.00)
GFR calc Af Amer: 45 mL/min — ABNORMAL LOW (ref >60)
GFR calc non Af Amer: 39 mL/min — ABNORMAL LOW (ref >60)
Glucose, Bld: 155 mg/dL — ABNORMAL HIGH (ref 65–99)
Potassium: 4.1 mmol/L (ref 3.5–5.1)
Sodium: 137 mmol/L (ref 135–145)

## 2017-04-05 LAB — URINALYSIS, ROUTINE W REFLEX MICROSCOPIC
Bilirubin Urine: NEGATIVE
Glucose, UA: >=500 mg/dL — AB
Hgb urine dipstick: NEGATIVE
Ketones, ur: NEGATIVE mg/dL
Leukocytes, UA: NEGATIVE
Nitrite: NEGATIVE
Protein, ur: NEGATIVE mg/dL
Specific Gravity, Urine: 1.029 (ref 1.005–1.030)
pH: 5 (ref 5.0–8.0)

## 2017-04-05 LAB — HIV ANTIBODY (ROUTINE TESTING W REFLEX): HIV Screen 4th Generation wRfx: NONREACTIVE

## 2017-04-05 LAB — TROPONIN I
Troponin I: 0.97 ng/mL (ref <0.03)
Troponin I: 4.23 ng/mL (ref <0.03)
Troponin I: 10.92 ng/mL (ref <0.03)

## 2017-04-05 LAB — CBC
HCT: 38.5 % (ref 36.0–46.0)
Hemoglobin: 12.6 g/dL (ref 12.0–15.0)
MCH: 24 pg — ABNORMAL LOW (ref 26.0–34.0)
MCHC: 32.7 g/dL (ref 30.0–36.0)
MCV: 73.5 fL — ABNORMAL LOW (ref 78.0–100.0)
Platelets: 189 10*3/uL (ref 150–400)
RBC: 5.24 MIL/uL — ABNORMAL HIGH (ref 3.87–5.11)
RDW: 16.3 % — ABNORMAL HIGH (ref 11.5–15.5)
WBC: 10.3 10*3/uL (ref 4.0–10.5)

## 2017-04-05 LAB — GLUCOSE, CAPILLARY
Glucose-Capillary: 139 mg/dL — ABNORMAL HIGH (ref 65–99)
Glucose-Capillary: 153 mg/dL — ABNORMAL HIGH (ref 65–99)
Glucose-Capillary: 214 mg/dL — ABNORMAL HIGH (ref 65–99)
Glucose-Capillary: 184 mg/dL — ABNORMAL HIGH (ref 65–99)

## 2017-04-05 LAB — LACTIC ACID, PLASMA: Lactic Acid, Venous: 1 mmol/L (ref 0.5–1.9)

## 2017-04-05 LAB — ECHOCARDIOGRAM COMPLETE
Height: 63 in
Weight: 3305.14 oz

## 2017-04-05 LAB — MAGNESIUM
Magnesium: 2.3 mg/dL (ref 1.7–2.4)
Magnesium: 2.3 mg/dL (ref 1.7–2.4)

## 2017-04-05 LAB — MRSA PCR SCREENING: MRSA by PCR: NEGATIVE

## 2017-04-05 MED ORDER — NOREPINEPHRINE BITARTRATE 1 MG/ML IV SOLN
2.0000 ug/min | INTRAVENOUS | Status: DC
  Filled 2017-04-05: qty 4

## 2017-04-05 MED ORDER — INSULIN GLARGINE 100 UNIT/ML ~~LOC~~ SOLN
14.0000 [IU] | Freq: Two times a day (BID) | SUBCUTANEOUS | Status: DC
  Administered 2017-04-05 – 2017-04-06 (×3): 14 [IU] via SUBCUTANEOUS
  Filled 2017-04-05 (×4): qty 0.14

## 2017-04-05 MED ORDER — PNEUMOCOCCAL VAC POLYVALENT 25 MCG/0.5ML IJ INJ: 0.5000 mL | INJECTION | INTRAMUSCULAR | Status: DC

## 2017-04-05 MED ORDER — SODIUM CHLORIDE 0.9 % IV BOLUS (SEPSIS)
250.0000 mL | Freq: Once | INTRAVENOUS | Status: AC
  Administered 2017-04-05: 250 mL via INTRAVENOUS

## 2017-04-05 MED ORDER — HEPARIN (PORCINE) IN NACL 100-0.45 UNIT/ML-% IJ SOLN
1750.0000 [IU]/h | INTRAMUSCULAR | Status: DC
  Administered 2017-04-06 (×2): 1200 [IU]/h via INTRAVENOUS
  Administered 2017-04-07: 1600 [IU]/h via INTRAVENOUS
  Administered 2017-04-08 – 2017-04-11 (×5): 1750 [IU]/h via INTRAVENOUS
  Filled 2017-04-05 (×6): qty 250
  Filled 2017-04-05: qty 500
  Filled 2017-04-05 (×2): qty 250

## 2017-04-05 MED ORDER — METOPROLOL TARTRATE 12.5 MG HALF TABLET
12.5000 mg | ORAL_TABLET | Freq: Two times a day (BID) | ORAL | Status: DC
  Administered 2017-04-05: 12.5 mg via ORAL
  Filled 2017-04-05: qty 1

## 2017-04-05 MED ORDER — SODIUM CHLORIDE 0.9 % IV SOLN
INTRAVENOUS | Status: DC
  Administered 2017-04-05: 12:00:00 via INTRAVENOUS

## 2017-04-05 NOTE — Consult Note (Signed)
[address]       [place] [address]             [phone]        Rachael Davis [facility] Medical Record #[id number] Date of Birth: 1956/11/14  Referring:  DR Scarlette Slice Care:  No PCP Per Patient  Chief Complaint:    Chief Complaint  Patient presents with  . Chest Pain   Patient examined, coronary angiogram images personally reviewed and counseled with patient and family  History of Present Illness:     The patient is a very nice 61 year old obese AA female diabetic nonsmoker who presents to the hospital with 24 hours of chest discomfort cough and malaise. She had positive cardiac enzymes and ST segment changes consistent with ST elevation MI. Her risk factors include hypertension, diabetes, nephropathy, and positive family history for MI in her father who died in his 17s. Her chest pain improved with nitroglycerin in the ED. She was taken for urgent cardiac catheterization which demonstrated LVEDP 22 mmHg. LVEF 40%. Acute occlusion of the circumflex, chronic occlusion of the RCA, high-grade stenosis of the LAD and ramus intermediate. She did not undergo PCI. It appears she was on norepinephrine at some point during the cardiac catheterization for low blood pressure. She was admitted to the CCU for further observation and consideration of multivessel CABG due to her diabetes three-vessel disease and recent MI with moderate LV dysfunction. Her troponin continues to rise today and is now 10.5. She has denied chest pain or significant shortness of breath. She has had some PVCs. Blood pressure is 95/70. Chest x-ray shows some mild interstitial edema.   Current Activity/ Functional Status: The patient is married and works full time as a housewife   Zubrod Score: At the time of surgery this patient's most appropriate activity status/level should be described as: []     0    Normal activity, no symptoms []     1    Restricted in physical strenuous activity  but ambulatory, able to do out light work []     2    Ambulatory and capable of self care, unable to do work activities, up and about                 more than 50%  Of the time                            [x]     3    Only limited self care, in bed greater than 50% of waking hours []     4    Completely disabled, no self care, confined to bed or chair []     5    Moribund  Past Medical History:  Diagnosis Date  . Diabetes (HCC)   . Hypertension     Past Surgical History:  Procedure Laterality Date  . LEFT HEART CATH AND CORONARY ANGIOGRAPHY N/A 04/04/2017   Procedure: Left Heart Cath and Coronary Angiography;  Surgeon: Peter M [name], MD;  Location: [facility] INVASIVE CV LAB;  Service: Cardiovascular;  Laterality: N/A;  . TUBAL LIGATION      History  Smoking Status  . Never Smoker  Smokeless Tobacco  . Never Used    History  Alcohol Use No    Social History   Social History  . Marital status: Married    Spouse name: N/A  . Number of children: N/A  . Years  of education: N/A   Occupational History  . Not on file.   Social History Main Topics  . Smoking status: Never Smoker  . Smokeless tobacco: Never Used  . Alcohol use No  . Drug use: No  . Sexual activity: Not on file   Other Topics Concern  . Not on file   Social History Narrative  . No narrative on file    No Known Allergies  Current Facility-Administered Medications  Medication Dose Route Frequency Provider Last Rate Last Dose  . 0.9 %  sodium chloride infusion   Intravenous Continuous Shaune Pollack, MD 20 mL/hr at 04/05/17 0600    . 0.9 %  sodium chloride infusion  250 mL Intravenous PRN Peter M [name], MD      . 0.9 %  sodium chloride infusion   Intravenous Continuous Chilton Si, MD 10 mL/hr at 04/05/17 1223    . acetaminophen (TYLENOL) tablet 650 mg  650 mg Oral Q4H PRN Peter M [name], MD      . aspirin EC tablet 81 mg  81 mg Oral Daily Peter M [name], MD   81 mg at 04/05/17 4098  . atorvastatin (LIPITOR)  tablet 80 mg  80 mg Oral q1800 Peter M [name], MD      . heparin ADULT infusion 100 units/mL (25000 units/231mL sodium chloride 0.45%)  1,000 Units/hr Intravenous Continuous Titus Mould, RPH 10 mL/hr at 04/05/17 0800 1,000 Units/hr at 04/05/17 0800  . insulin aspart (novoLOG) injection 0-15 Units  0-15 Units Subcutaneous TID WC Peter M [name], MD   3 Units at 04/05/17 1304  . insulin glargine (LANTUS) injection 14 Units  14 Units Subcutaneous BID Kerin Perna, MD   14 Units at 04/05/17 1223  . metoprolol tartrate (LOPRESSOR) tablet 12.5 mg  12.5 mg Oral BID Chilton Si, MD   12.5 mg at 04/05/17 1222  . nitroGLYCERIN (NITROSTAT) SL tablet 0.4 mg  0.4 mg Sublingual Q5 Min x 3 PRN Peter M [name], MD      . norepinephrine (LEVOPHED) 4 mg in dextrose 5 % 250 mL (0.016 mg/mL) infusion  2 mcg/min Intravenous Titrated Joellyn Rued, MD   Stopped at 04/05/17 0315  . ondansetron (ZOFRAN) injection 4 mg  4 mg Intravenous Q6H PRN Peter M [name], MD   4 mg at 04/05/17 0200  . sodium chloride flush (NS) 0.9 % injection 3 mL  3 mL Intravenous Q12H Peter M [name], MD   3 mL at 04/05/17 0831  . sodium chloride flush (NS) 0.9 % injection 3 mL  3 mL Intravenous PRN Peter M [name], MD        Prescriptions Prior to Admission  Medication Sig Dispense Refill Last Dose  . dapagliflozin propanediol (FARXIGA) 10 MG TABS tablet Take 10 mg by mouth daily.   04/04/2017 at Unknown time  . glipiZIDE (GLUCOTROL XL) 10 MG 24 hr tablet Take 10 mg by mouth 2 (two) times daily.   04/04/2017 at Unknown time  . losartan (COZAAR) 25 MG tablet Take 25 mg by mouth daily.   04/04/2017 at Unknown time  . metFORMIN (GLUCOPHAGE) 1000 MG tablet Take 1,000 mg by mouth 2 (two) times daily with a meal.   04/04/2017 at Unknown time  . saxagliptin HCl (ONGLYZA) 5 MG TABS tablet Take 5 mg by mouth daily.   04/04/2017 at Unknown time  . triamterene-hydrochlorothiazide (MAXZIDE) 75-50 MG tablet Take 1 tablet by mouth daily.   04/04/2017 at Unknown  time  . ibuprofen (ADVIL,MOTRIN)  800 MG tablet Take 1 tablet (800 mg total) by mouth every 8 (eight) hours as needed. (Patient not taking: Reported on 04/05/2017) 21 tablet 0 Completed Course at Unknown time    Family History  Problem Relation Age of Onset  . Heart attack Father 36     Review of Systems:       Cardiac Review of Systems: Y or N  Chest Pain [   y ]  Resting SOB [  n ] Exertional SOB  [n  ]  Orthopnea [ n ]   Pedal Edema [  n ]    Palpitations [place]  ] Syncope  [n  ]   Presyncope [n   ]  General Review of Systems: [Y] = yes [  ]=no Constitional: recent weight change [  ]; anorexia [  ]; fatigue [  ]; nausea [  ]; night sweats [  ]; fever [  ]; or chills [  ]                                                               Dental: poor dentition[  ]; Last Dentist visit: 12 months  Eye : blurred vision [  ]; diplopia [   ]; vision changes [  ];  Amaurosis fugax[  ]; Resp: cough [ y ];  wheezing[  ];  hemoptysis[  ]; shortness of breath[  ]; paroxysmal nocturnal dyspnea[  ]; dyspnea on exertion[y  ]; or orthopnea[  ];  GI:  gallstones[  ], vomiting[  ];  dysphagia[  ]; melena[  ];  hematochezia [  ]; heartburn[ y ];   Hx of  Colonoscopy[  ]; GU: kidney stones [  ]; hematuria[  ];   dysuria [  ];  nocturia[  ];  history of     obstruction [  ]; urinary frequency [  ]             Skin: rash, swelling[  ];, hair loss[  ];  peripheral edema[  ];  or itching[  ]; Musculosketetal: myalgias[  ];  joint swelling[  ];  joint erythema[  ];  joint pain[  ];  back pain[y  ];  Heme/Lymph: bruising[  ];  bleeding[  ];  anemia[  ];  Neuro: TIA[  ];  headaches[  ];  stroke[  ];  vertigo[  ];  seizures[  ];   paresthesias[  ];  difficulty walking[  ];  Psych:depression[  ]; anxiety[  ];  Endocrine: diabetes[  ];  thyroid dysfunction[  ];  Immunizations: Flu [  ]; Pneumococcal[  ];  Other: Right-hand dominant, no history thoracic trauma  Physical Exam: BP 108/64   Pulse 78   Temp 98.6 F (37 C)  (Oral)   Resp (!) 26   Ht 5\' 3"  (1.6 m)   Wt 206 lb 9.1 oz (93.7 kg)   SpO2 98%   BMI 36.59 kg/m       Physical Exam  General: Pleasant obese AA female no acute distress HEENT: Normocephalic pupils equal , dentition adequate Neck: Supple without JVD, adenopathy, or bruit Chest: Clear to auscultation, symmetrical breath sounds, no rhonchi, no tenderness             or deformity Cardiovascular: Regular rate and  rhythm, no murmur, no gallop, peripheral pulses             palpable in all extremities Abdomen:  Soft, nontender, no palpable mass or organomegaly Extremities: Warm, well-perfused, no clubbing cyanosis edema or tenderness,              no venous stasis changes of the legs Rectal/GU: Deferred Neuro: Grossly non--focal and symmetrical throughout Skin: Clean and dry without rash or ulceration           Diagnostic Studies & Laboratory data:     Recent Radiology Findings:   Dg Chest Port 1 View  Result Date: 04/04/2017 CLINICAL DATA:  Shortness of breath and STEMI EXAM: PORTABLE CHEST 1 VIEW COMPARISON:  None. FINDINGS: Cardiac shadow is mildly enlarged.  The lungs are clear bilaterally. IMPRESSION: No active disease. Electronically Signed   By: Alcide Clever M.D.   On: 04/04/2017 21:47     I have independently reviewed the above radiologic studies.  Recent Lab Findings: Lab Results  Component Value Date   WBC 10.3 04/05/2017   HGB 12.6 04/05/2017   HCT 38.5 04/05/2017   PLT 189 04/05/2017   GLUCOSE 155 (H) 04/05/2017   CHOL 181 04/04/2017   TRIG 56 04/04/2017   HDL 55 04/04/2017   LDLCALC 115 (H) 04/04/2017   ALT 20 04/04/2017   AST 29 04/04/2017   NA 137 04/05/2017   K 4.1 04/05/2017   CL 105 04/05/2017   CREATININE 1.43 (H) 04/05/2017   BUN 25 (H) 04/05/2017   CO2 22 04/05/2017   INR 1.05 04/04/2017      Assessment / Plan:     Acute MI with occlusion of the circumflex without reperfusion, moderate LV dysfunction Chronic occlusion the  RCA High-grade stenosis of the LAD and ramus intermediate  Diabetes mellitus Positive family history of CAD Hypertension Obesity  Patient would benefit from multivessel CABG after she recovers from her MI. There was some mitral regurgitation noted on ventriculogram and a echocardiogram is pending. I have discussed with the patient and her family the recommendation for multivessel CABG for treatment of her coronary disease in order to help preserve LV function, prevent further hospitalization for cardiac disease, and to improve long-term survival. She agrees to remain hospitalized until surgery can be scheduled next week. She appears to be stable on IV heparin. Her troponin continues to increase.  Kathlee Nations trigt M.D.    @ME1 @ 04/05/2017 1:11 PM

## 2017-04-05 NOTE — Progress Notes (Signed)
Notified Dr. Duke Salvia regarding 8 beat run of VT.  VS 95-100s/67-70s (MAP 77-84).  Patient asymptomatic at time of VT run.  No new orders given.  Will continue to assess and/or monitor as needed.

## 2017-04-05 NOTE — Telephone Encounter (Signed)
Received [employer] Attending [employer] Statement for Dr Duke Salvia to review, complete and sign.  Received via fax.  No Auth/Pmt to [employer].  Sent to [employer] via Courier on 04/05/17 -to be delivered on 04/06/17. lp

## 2017-04-05 NOTE — Progress Notes (Signed)
  Echocardiogram 2D Echocardiogram has been performed.  Myldred Raju T Benford Asch 04/05/2017, 4:35 PM

## 2017-04-05 NOTE — Progress Notes (Signed)
ANTICOAGULATION CONSULT NOTE - Follow Up Consult  Pharmacy Consult for Heparin Indication: 3V CAD  No Known Allergies  Patient Measurements: Height: 5\' 3"  (160 cm) Weight: 206 lb 9.1 oz (93.7 kg) IBW/kg (Calculated) : 52.4 Heparin Dosing Weight: 74kg  Vital Signs: Temp: 99.1 F (37.3 C) (05/03 1939) Temp Source: Oral (05/03 1939) BP: 98/54 (05/03 2200) Pulse Rate: 72 (05/03 2200)  Labs:  Recent Labs  04/04/17 2042 04/04/17 2308 04/05/17 0450 04/05/17 0451 04/05/17 0708 04/05/17 1012 04/05/17 1424 04/05/17 2211  HGB 13.5  --   --  12.6  --   --   --   --   HCT 41.8  --   --  38.5  --   --   --   --   PLT 199  --   --  189  --   --   --   --   APTT 27  --   --   --   --   --   --   --   LABPROT 13.7  --   --   --   --   --   --   --   INR 1.05  --   --   --   --   --   --   --   HEPARINUNFRC  --   --   --   --   --   --  0.24* 0.36  CREATININE 1.60* 1.50*  --   --  1.43*  --   --   --   TROPONINI 0.60* 0.97* 4.23*  --   --  10.92*  --   --     Estimated Creatinine Clearance: 44.9 mL/min (A) (by C-G formula based on SCr of 1.43 mg/dL (H)).   Medications:  Heparin @ 1200 units/hr  Assessment: 60yof s/p STEMI found to have 3V CAD continues on heparin with plan for CABG next week. Initial heparin level is below goal at 0.24, but now therapeutic after rate increase. CBC stable. No bleeding or infusion issues documented.  Goal of Therapy:  Heparin level 0.3-0.7 units/ml Monitor platelets by anticoagulation protocol: Yes   Plan:  1) Continue heparin at 1200 units/hr 2) Daily heparin level/CBC 3) Monitor for s/sx of bleeding  Jola Critzer L Eastyn Dattilo 04/05/2017,11:10 PM

## 2017-04-05 NOTE — Progress Notes (Signed)
Pt. OOB to BSC started feeling nauseated, hot and SOB.  Placed back in bed w/O2 at 3L/[place].  EKG obtained.  Notified Dr. Duke Salvia and Annabelle Harman, NP.  Orders given and initiated.

## 2017-04-05 NOTE — Progress Notes (Signed)
Progress Note  Patient Name: Rachael Davis Date of Encounter: 04/05/2017  Primary Cardiologist: new Duke Salvia)  Subjective   Feeling well. Denies chest pain or shortness of breath. Denies lightheadedness or dizziness.  Inpatient Medications    Scheduled Meds: . aspirin EC  81 mg Oral Daily  . atorvastatin  80 mg Oral q1800  . insulin aspart  0-15 Units Subcutaneous TID WC  . metoprolol tartrate  12.5 mg Oral BID  . sodium chloride flush  3 mL Intravenous Q12H   Continuous Infusions: . sodium chloride 20 mL/hr at 04/05/17 0600  . sodium chloride    . sodium chloride 1 mL/kg/hr (04/05/17 0700)  . heparin 1,000 Units/hr (04/05/17 0700)  . norepinephrine (LEVOPHED) Adult infusion Stopped (04/05/17 0315)   PRN Meds: sodium chloride, acetaminophen, nitroGLYCERIN, ondansetron (ZOFRAN) IV, sodium chloride flush   Vital Signs    Vitals:   04/05/17 0615 04/05/17 0630 04/05/17 0645 04/05/17 0700  BP: 94/62 94/64  (!) 96/59  Pulse: 70 69 71 68  Resp: 17 19 19 15   Temp:      TempSrc:      SpO2: 97% 98% 97% 97%  Weight:      Height:        Intake/Output Summary (Last 24 hours) at 04/05/17 0748 Last data filed at 04/05/17 0700  Gross per 24 hour  Intake            867.7 ml  Output              450 ml  Net            417.7 ml   Filed Weights   04/04/17 2051 04/05/17 0245  Weight: 95.3 kg (210 lb) 93.7 kg (206 lb 9.1 oz)    Telemetry    Sinus rhythm. 14 beats of NSVT. PVCs. - Personally Reviewed  ECG    Sinus rhythm. Rate 77 bpm. Left axis deviation. Prior inferior infarct. Prior posterior infarct.   - Personally Reviewed  Physical Exam   GEN: No acute distress.   Neck: No JVD Cardiac: RRR, no murmurs, rubs, or gallops.  Respiratory: Clear to auscultation bilaterally. GI: Soft, nontender, non-distended  MS: No edema; No deformity. Neuro:  Nonfocal  Psych: Normal affect   Labs    Chemistry Recent Labs Lab 04/04/17 2042 04/04/17 2308 04/05/17 0708    NA 136 135 137  K 4.5 4.3 4.1  CL 99* 100* 105  CO2 25 23 22   GLUCOSE 209* 209* 155*  BUN 25* 25* 25*  CREATININE 1.60* 1.50* 1.43*  CALCIUM 9.5 9.4 8.8*  PROT  --  7.2  --   ALBUMIN  --  3.7  --   AST  --  29  --   ALT  --  20  --   ALKPHOS  --  101  --   BILITOT  --  0.6  --   GFRNONAA 34* 36* 39*  GFRAA 39* 42* 45*  ANIONGAP 12 12 10      Hematology Recent Labs Lab 04/04/17 2042 04/05/17 0451  WBC 12.0* 10.3  RBC 5.64* 5.24*  HGB 13.5 12.6  HCT 41.8 38.5  MCV 74.1* 73.5*  MCH 23.9* 24.0*  MCHC 32.3 32.7  RDW 16.2* 16.3*  PLT 199 189    Cardiac Enzymes Recent Labs Lab 04/04/17 2042 04/04/17 2308 04/05/17 0450  TROPONINI 0.60* 0.97* 4.23*    Recent Labs Lab 04/04/17 2100  TROPIPOC 0.50*     BNPNo results for input(s):  BNP, PROBNP in the last 168 hours.   DDimer No results for input(s): DDIMER in the last 168 hours.   Radiology    Dg Chest Port 1 View  Result Date: 04/04/2017 CLINICAL DATA:  Shortness of breath and STEMI EXAM: PORTABLE CHEST 1 VIEW COMPARISON:  None. FINDINGS: Cardiac shadow is mildly enlarged.  The lungs are clear bilaterally. IMPRESSION: No active disease. Electronically Signed   By: Alcide Clever M.D.   On: 04/04/2017 21:47    Cardiac Studies   LHC 04/04/17: Prox LAD lesion, 75 %stenosed.  Prox LAD to Mid LAD lesion, 65 %stenosed.  Mid LAD to Dist LAD lesion, 55 %stenosed.  Ramus lesion, 90 %stenosed.  Lat Ramus lesion, 50 %stenosed.  Prox Cx lesion, 100 %stenosed.  Prox RCA to Mid RCA lesion, 95 %stenosed.  There is moderate left ventricular systolic dysfunction.  LV end diastolic pressure is mildly elevated.  The left ventricular ejection fraction is 35-45% by visual estimate.  There is mild (2+) mitral regurgitation.   1. Severe 3 vessel obstructive CAD 2. Moderate LV dysfunction 3. Mildly elevated LV EDP  Patient Profile     61 y.o. female With hypertension, hyperlipidemia, and diabetes here with ST  elevation myocardial infarction. Found to have 3 vessel CAD on cardiac catheterization.   Assessment & Plan    # CAD: # STEMI: Rachael Davis presented with a completed infarct. She was found to have 3 vessel CAD on cardiac catheterization. Will call cardiothoracic surgery for consideration of CABG.  She did not receive P2Y12 inhibitor.   continue aspirin 81 mg, heparin infusion, and atorvastatin 80 mg daily. We will reduce metoprolol to 12.5 mg due to low blood pressures.   # Acute systolic and diastolic heart failure: # Moderate MR: Rachael Davis is euvolemic on exam.  LVEDP was 22 mmHg.  Will diurese gently as tolerated by BP.  BP too low for ACE-I/ARB/ARNI.  Will add as tolerated along with spironolactone.  Echo pending.   # Hypertension:  Home HCTZ-triameterene held due to low BP.  Reduce metoprolol as above.   # Hyperlipidemia:  Atorvastatin 80 mg daily added this admission. She will need lipids and a CMP in 6 weeks.   # Diabetes:  Home oral agents are held.  She was started on SSI.  ACHS glucose monitoring.   Signed, Chilton Si, MD  04/05/2017, 7:48 AM

## 2017-04-05 NOTE — Progress Notes (Signed)
Dr. Virgina Organ (cardiology) updated on pt's hypotension and nausea. BP 79-84/50s  MAP 65-68. Verbal order received to start Levophed gtt at 2 mcg if pt's MAP sustains below 65. PRN zofran given. Will implement and continue to monitor pt closely.

## 2017-04-05 NOTE — Progress Notes (Signed)
ANTICOAGULATION CONSULT NOTE - Follow Up Consult  Pharmacy Consult for Heparin Indication: 3V CAD  No Known Allergies  Patient Measurements: Height: 5\' 3"  (160 cm) Weight: 206 lb 9.1 oz (93.7 kg) IBW/kg (Calculated) : 52.4 Heparin Dosing Weight: 74kg  Vital Signs: Temp: 98.6 F (37 C) (05/03 1254) Temp Source: Oral (05/03 1254) BP: 78/59 (05/03 1415) Pulse Rate: 67 (05/03 1415)  Labs:  Recent Labs  04/04/17 2042 04/04/17 2308 04/05/17 0450 04/05/17 0451 04/05/17 0708 04/05/17 1012 04/05/17 1424  HGB 13.5  --   --  12.6  --   --   --   HCT 41.8  --   --  38.5  --   --   --   PLT 199  --   --  189  --   --   --   APTT 27  --   --   --   --   --   --   LABPROT 13.7  --   --   --   --   --   --   INR 1.05  --   --   --   --   --   --   HEPARINUNFRC  --   --   --   --   --   --  0.24*  CREATININE 1.60* 1.50*  --   --  1.43*  --   --   TROPONINI 0.60* 0.97* 4.23*  --   --  10.92*  --     Estimated Creatinine Clearance: 44.9 mL/min (A) (by C-G formula based on SCr of 1.43 mg/dL (H)).   Medications:  Heparin @ 1000 units/hr  Assessment: 60yof s/p STEMI found to have 3V CAD continues on heparin with plan for CABG next week. Initial heparin level is below goal at 0.24. CBC stable. No bleeding.  Goal of Therapy:  Heparin level 0.3-0.7 units/ml Monitor platelets by anticoagulation protocol: Yes   Plan:  1) Increase heparin to 1200 units/hr 2) Check 6 hour heparin level  Fredrik Rigger 04/05/2017,3:15 PM

## 2017-04-06 ENCOUNTER — Inpatient Hospital Stay (HOSPITAL_COMMUNITY): Payer: Managed Care, Other (non HMO)

## 2017-04-06 DIAGNOSIS — I2121 ST elevation (STEMI) myocardial infarction involving left circumflex coronary artery: Secondary | ICD-10-CM

## 2017-04-06 DIAGNOSIS — I25110 Atherosclerotic heart disease of native coronary artery with unstable angina pectoris: Secondary | ICD-10-CM

## 2017-04-06 LAB — GLUCOSE, CAPILLARY
Glucose-Capillary: 147 mg/dL — ABNORMAL HIGH (ref 65–99)
Glucose-Capillary: 159 mg/dL — ABNORMAL HIGH (ref 65–99)
Glucose-Capillary: 217 mg/dL — ABNORMAL HIGH (ref 65–99)
Glucose-Capillary: 172 mg/dL — ABNORMAL HIGH (ref 65–99)

## 2017-04-06 LAB — TROPONIN I: Troponin I: 29.16 ng/mL (ref <0.03)

## 2017-04-06 LAB — SPIROMETRY WITH GRAPH
FEF 25-75 Post: 0.46 L/sec
FEF 25-75 Pre: 0.8 L/sec
FEF2575-%Change-Post: -42 %
FEF2575-%Pred-Post: 23 %
FEF2575-%Pred-Pre: 41 %
FEV1-%Change-Post: -14 %
FEV1-%Pred-Post: 40 %
FEV1-%Pred-Pre: 46 %
FEV1-Post: 0.78 L
FEV1-Pre: 0.91 L
FEV1FVC-%Change-Post: 0 %
FEV1FVC-%Pred-Pre: 95 %
FEV6-%Change-Post: -15 %
FEV6-%Pred-Post: 42 %
FEV6-%Pred-Pre: 50 %
FEV6-Post: 1.03 L
FEV6-Pre: 1.21 L
FEV6FVC-%Pred-Post: 104 %
FEV6FVC-%Pred-Pre: 104 %
FVC-%Change-Post: -15 %
FVC-%Pred-Post: 41 %
FVC-%Pred-Pre: 48 %
FVC-Post: 1.03 L
FVC-Pre: 1.21 L
Post FEV1/FVC ratio: 76 %
Post FEV6/FVC ratio: 100 %
Pre FEV1/FVC ratio: 75 %
Pre FEV6/FVC Ratio: 100 %

## 2017-04-06 LAB — LIPID PANEL
Cholesterol: 168 mg/dL (ref 0–200)
HDL: 48 mg/dL (ref >40)
LDL Cholesterol: 107 mg/dL — ABNORMAL HIGH (ref 0–99)
Total CHOL/HDL Ratio: 3.5 RATIO
Triglycerides: 67 mg/dL (ref <150)
VLDL: 13 mg/dL (ref 0–40)

## 2017-04-06 LAB — CBC
HCT: 37.3 % (ref 36.0–46.0)
Hemoglobin: 11.9 g/dL — ABNORMAL LOW (ref 12.0–15.0)
MCH: 23.4 pg — ABNORMAL LOW (ref 26.0–34.0)
MCHC: 31.9 g/dL (ref 30.0–36.0)
MCV: 73.4 fL — ABNORMAL LOW (ref 78.0–100.0)
Platelets: 188 10*3/uL (ref 150–400)
RBC: 5.08 MIL/uL (ref 3.87–5.11)
RDW: 15.9 % — ABNORMAL HIGH (ref 11.5–15.5)
WBC: 10.3 10*3/uL (ref 4.0–10.5)

## 2017-04-06 LAB — COMPREHENSIVE METABOLIC PANEL
ALT: 40 U/L (ref 14–54)
AST: 173 U/L — ABNORMAL HIGH (ref 15–41)
Albumin: 3 g/dL — ABNORMAL LOW (ref 3.5–5.0)
Alkaline Phosphatase: 89 U/L (ref 38–126)
Anion gap: 11 (ref 5–15)
BUN: 25 mg/dL — ABNORMAL HIGH (ref 6–20)
CO2: 22 mmol/L (ref 22–32)
Calcium: 8.9 mg/dL (ref 8.9–10.3)
Chloride: 104 mmol/L (ref 101–111)
Creatinine, Ser: 1.31 mg/dL — ABNORMAL HIGH (ref 0.44–1.00)
GFR calc Af Amer: 50 mL/min — ABNORMAL LOW (ref >60)
GFR calc non Af Amer: 43 mL/min — ABNORMAL LOW (ref >60)
Glucose, Bld: 166 mg/dL — ABNORMAL HIGH (ref 65–99)
Potassium: 3.6 mmol/L (ref 3.5–5.1)
Sodium: 137 mmol/L (ref 135–145)
Total Bilirubin: 0.7 mg/dL (ref 0.3–1.2)
Total Protein: 6.8 g/dL (ref 6.5–8.1)

## 2017-04-06 LAB — HEPARIN LEVEL (UNFRACTIONATED): Heparin Unfractionated: 0.38 IU/mL (ref 0.30–0.70)

## 2017-04-06 LAB — HEMOGLOBIN A1C
Hgb A1c MFr Bld: 8.5 % — ABNORMAL HIGH (ref 4.8–5.6)
Mean Plasma Glucose: 197 mg/dL

## 2017-04-06 LAB — PLATELET INHIBITION P2Y12: Platelet Function  P2Y12: 278 [PRU] (ref 194–418)

## 2017-04-06 LAB — TSH: TSH: 0.264 u[IU]/mL — ABNORMAL LOW (ref 0.350–4.500)

## 2017-04-06 MED ORDER — ALBUTEROL SULFATE (2.5 MG/3ML) 0.083% IN NEBU
2.5000 mg | INHALATION_SOLUTION | Freq: Once | RESPIRATORY_TRACT | Status: AC
  Administered 2017-04-06: 2.5 mg via RESPIRATORY_TRACT

## 2017-04-06 MED ORDER — INSULIN GLARGINE 100 UNIT/ML ~~LOC~~ SOLN
18.0000 [IU] | Freq: Two times a day (BID) | SUBCUTANEOUS | Status: DC
  Administered 2017-04-06 – 2017-04-08 (×5): 18 [IU] via SUBCUTANEOUS
  Filled 2017-04-06 (×6): qty 0.18

## 2017-04-06 MED ORDER — PNEUMOCOCCAL VAC POLYVALENT 25 MCG/0.5ML IJ INJ: 0.5000 mL | INJECTION | INTRAMUSCULAR | Status: AC | PRN

## 2017-04-06 NOTE — Progress Notes (Signed)
ANTICOAGULATION CONSULT NOTE - Follow Up Consult  Pharmacy Consult for Heparin Indication: 3V CAD plan CABG  No Known Allergies  Patient Measurements: Height: 5\' 3"  (160 cm) Weight: 202 lb 13.2 oz (92 kg) IBW/kg (Calculated) : 52.4 Heparin Dosing Weight: 74kg  Vital Signs: Temp: 99.1 F (37.3 C) (05/04 0700) Temp Source: Oral (05/04 0700) BP: 110/73 (05/04 0900) Pulse Rate: 85 (05/04 0900)  Labs:  Recent Labs  04/04/17 2042 04/04/17 2308 04/05/17 0450 04/05/17 0451 04/05/17 0708 04/05/17 1012 04/05/17 1424 04/05/17 2211 04/06/17 0243 04/06/17 0805  HGB 13.5  --   --  12.6  --   --   --   --  11.9*  --   HCT 41.8  --   --  38.5  --   --   --   --  37.3  --   PLT 199  --   --  189  --   --   --   --  188  --   APTT 27  --   --   --   --   --   --   --   --   --   LABPROT 13.7  --   --   --   --   --   --   --   --   --   INR 1.05  --   --   --   --   --   --   --   --   --   HEPARINUNFRC  --   --   --   --   --   --  0.24* 0.36 0.38  --   CREATININE 1.60* 1.50*  --   --  1.43*  --   --   --  1.31*  --   TROPONINI 0.60* 0.97* 4.23*  --   --  10.92*  --   --   --  29.16*    Estimated Creatinine Clearance: 48.6 mL/min (A) (by C-G formula based on SCr of 1.31 mg/dL (H)).     Assessment: 60yof s/p STEMI found to have 3V CAD plan CABG next week P2Y12 [id number] Currently on heparin drip 1200 uts/hr HL 0.38 at goal.  CBC stable. No bleeding or infusion issues documented.  Goal of Therapy:  Heparin level 0.3-0.7 units/ml Monitor platelets by anticoagulation protocol: Yes   Plan:  1) Continue heparin at 1200 units/hr 2) Daily heparin level/CBC 3) Monitor for s/sx of bleeding  Leota Sauers Pharm.D. CPP, BCPS Clinical Pharmacist [phone] 04/06/2017 11:41 AM

## 2017-04-06 NOTE — Progress Notes (Signed)
2 Days Post-Op Procedure(s) (LRB): Left Heart Cath and Coronary Angiography (N/A) Subjective: Troponin still rising[30] after proximal occlusion of circumflex Echo with EF .45, lateral hypokinesis, mild MR Will plan CABG mid next week to allow recovery from MI Objective: Vital signs in last 24 hours: Temp:  [98.1 F (36.7 C)-99.9 F (37.7 C)] 99.9 F (37.7 C) (05/04 1500) Pulse Rate:  [65-94] 94 (05/04 1500) Cardiac Rhythm: Normal sinus rhythm (05/04 0800) Resp:  [17-29] 29 (05/04 1500) BP: (90-113)/(45-79) 113/58 (05/04 1400) SpO2:  [94 %-100 %] 97 % (05/04 1500) Weight:  [202 lb 13.2 oz (92 kg)] 202 lb 13.2 oz (92 kg) (05/04 0300)  Hemodynamic parameters for last 24 hours:  stagble BP  Intake/Output from previous day: 05/03 0701 - 05/04 0700 In: 1605 [P.O.:865; I.V.:490; IV Piggyback:250] Out: 952 [Urine:952] Intake/Output this shift: Total I/O In: 100 [I.V.:100] Out: 550 [Urine:550]       Exam    General- alert and comfortable   Lungs- clear without rales, wheezes   Cor- regular rate and rhythm, no murmur , gallop   Abdomen- soft, non-tender   Extremities - warm, non-tender, minimal edema   Neuro- oriented, appropriate, no focal weakness   Lab Results:  Recent Labs  04/05/17 0451 04/06/17 0243  WBC 10.3 10.3  HGB 12.6 11.9*  HCT 38.5 37.3  PLT 189 188   BMET:  Recent Labs  04/05/17 0708 04/06/17 0243  NA 137 137  K 4.1 3.6  CL 105 104  CO2 22 22  GLUCOSE 155* 166*  BUN 25* 25*  CREATININE 1.43* 1.31*  CALCIUM 8.8* 8.9    PT/INR:  Recent Labs  04/04/17 2042  LABPROT 13.7  INR 1.05   ABG No results found for: PHART, HCO3, TCO2, ACIDBASEDEF, O2SAT CBG (last 3)   Recent Labs  04/05/17 2141 04/06/17 0819 04/06/17 1208  GLUCAP 184* 147* 159*    Assessment/Plan: S/P Procedure(s) (LRB): Left Heart Cath and Coronary Angiography (N/A) Creat stable after cath CABG next week 5-10  LOS: 2 days    [name] 04/06/2017

## 2017-04-06 NOTE — Progress Notes (Signed)
.  EKG CRITICAL VALUE     12 lead EKG performed.  Critical value noted. Aviva Signs, RN notified.   SCALES-PRICE,[name], CCT 04/06/2017 7:10 AM

## 2017-04-06 NOTE — Progress Notes (Signed)
Progress Note  Patient Name: Rachael Davis Date of Encounter: 04/06/2017  Primary Cardiologist: Rachael Davis)  Subjective   Feeling well. Denies chest pain or shortness of breath. Denies lightheadedness or dizziness.  Wants to know if she can get out of bed.   Inpatient Medications    Scheduled Meds: . aspirin EC  81 mg Oral Daily  . atorvastatin  80 mg Oral q1800  . insulin aspart  0-15 Units Subcutaneous TID WC  . insulin glargine  14 Units Subcutaneous BID  . pneumococcal 23 valent vaccine  0.5 mL Intramuscular Tomorrow-1000  . sodium chloride flush  3 mL Intravenous Q12H   Continuous Infusions: . sodium chloride 20 mL/hr at 04/06/17 0200  . sodium chloride    . sodium chloride 10 mL/hr at 04/06/17 0700  . heparin 1,200 Units/hr (04/06/17 0700)  . norepinephrine (LEVOPHED) Adult infusion Stopped (04/05/17 0315)   PRN Meds: sodium chloride, acetaminophen, ondansetron (ZOFRAN) IV, sodium chloride flush   Vital Signs    Vitals:   04/06/17 0403 04/06/17 0500 04/06/17 0600 04/06/17 0700  BP:  102/60 106/60 (!) 100/56  Pulse:  76 74 77  Resp:  (!) 26 (!) 23 (!) 24  Temp: 98.1 F (36.7 C)   99.1 F (37.3 C)  TempSrc: Oral   Oral  SpO2:  96% 96% 97%  Weight:      Height:        Intake/Output Summary (Last 24 hours) at 04/06/17 0854 Last data filed at 04/06/17 0700  Gross per 24 hour  Intake             1595 ml  Output              702 ml  Net              893 ml   Filed Weights   04/04/17 2051 04/05/17 0245 04/06/17 0300  Weight: 95.3 kg (210 lb) 93.7 kg (206 lb 9.1 oz) 92 kg (202 lb 13.2 oz)    Telemetry    Sinus rhythm. 8 beats of NSVT. PVCs. - Personally Reviewed  ECG    04/06/17: Sinus rhythm. Rate 83 bpm. Left axis deviation. Prior inferior infarct. Prior posterior infarct.   - Personally Reviewed  Physical Exam   GEN: No acute distress.   Neck: No JVD Cardiac: RRR, no murmurs, rubs, or gallops.  Respiratory: Clear to auscultation  bilaterally. GI: Soft, nontender, non-distended  MS: No edema; No deformity. Neuro:  Nonfocal  Psych: Normal affect   Labs    Chemistry  Recent Labs Lab 04/04/17 2308 04/05/17 0708 04/06/17 0243  NA 135 137 137  K 4.3 4.1 3.6  CL 100* 105 104  CO2 23 22 22   GLUCOSE 209* 155* 166*  BUN 25* 25* 25*  CREATININE 1.50* 1.43* 1.31*  CALCIUM 9.4 8.8* 8.9  PROT 7.2  --  6.8  ALBUMIN 3.7  --  3.0*  AST 29  --  173*  ALT 20  --  40  ALKPHOS 101  --  89  BILITOT 0.6  --  0.7  GFRNONAA 36* 39* 43*  GFRAA 42* 45* 50*  ANIONGAP 12 10 11      Hematology  Recent Labs Lab 04/04/17 2042 04/05/17 0451 04/06/17 0243  WBC 12.0* 10.3 10.3  RBC 5.64* 5.24* 5.08  HGB 13.5 12.6 11.9*  HCT 41.8 38.5 37.3  MCV 74.1* 73.5* 73.4*  MCH 23.9* 24.0* 23.4*  MCHC 32.3 32.7 31.9  RDW 16.2* 16.3*  15.9*  PLT 199 189 188    Cardiac Enzymes  Recent Labs Lab 04/04/17 2042 04/04/17 2308 04/05/17 0450 04/05/17 1012  TROPONINI 0.60* 0.97* 4.23* 10.92*     Recent Labs Lab 04/04/17 2100  TROPIPOC 0.50*     BNPNo results for input(s): BNP, PROBNP in the last 168 hours.   DDimer No results for input(s): DDIMER in the last 168 hours.   Radiology    Dg Chest 2 View  Result Date: 04/06/2017 CLINICAL DATA:  Preoperative chest radiograph. Coronary artery disease with preoperative evaluation for coronary artery bypass grafting EXAM: CHEST  2 VIEW COMPARISON:  Apr 04, 2017 FINDINGS: There is no edema or consolidation. Heart is upper normal in size with pulmonary vascularity within normal limits. No adenopathy. There is degenerative change in the thoracic spine. IMPRESSION: No edema or consolidation. Electronically Signed   By: Bretta Bang III M.D.   On: 04/06/2017 07:26   Dg Chest Port 1 View  Result Date: 04/04/2017 CLINICAL DATA:  Shortness of breath and STEMI EXAM: PORTABLE CHEST 1 VIEW COMPARISON:  None. FINDINGS: Cardiac shadow is mildly enlarged.  The lungs are clear bilaterally.  IMPRESSION: No active disease. Electronically Signed   By: Alcide Clever M.D.   On: 04/04/2017 21:47    Cardiac Studies   LHC 04/04/17: Prox LAD lesion, 75 %stenosed.  Prox LAD to Mid LAD lesion, 65 %stenosed.  Mid LAD to Dist LAD lesion, 55 %stenosed.  Ramus lesion, 90 %stenosed.  Lat Ramus lesion, 50 %stenosed.  Prox Cx lesion, 100 %stenosed.  Prox RCA to Mid RCA lesion, 95 %stenosed.  There is moderate left ventricular systolic dysfunction.  LV end diastolic pressure is mildly elevated.  The left ventricular ejection fraction is 35-45% by visual estimate.  There is mild (2+) mitral regurgitation.   1. Severe 3 vessel obstructive CAD 2. Moderate LV dysfunction 3. Mildly elevated LV EDP  Echo 04/05/17: Study Conclusions  - Left ventricle: LVEF is approximately 45% with hypokinesis of the   lateral wall The cavity size was normal. Wall thickness was   normal. Doppler parameters are consistent with abnormal left   ventricular relaxation (grade 1 diastolic dysfunction). - Mitral valve: There was mild regurgitation. - Pulmonary arteries: PA peak pressure: 36 mm Hg (S). Patient Profile     61 y.o. female With hypertension, hyperlipidemia, and diabetes here with ST elevation myocardial infarction. Found to have 3 vessel CAD on cardiac catheterization.   Assessment & Plan    # CAD: # STEMI: Rachael Davis presented with a completed infarct. She was found to have 3 vessel CAD on cardiac catheterization. She was seen by Rachael Davis and will go for CABG next week.  She had some hypotension and nausea yesterday.  Metoprolol was discontinued due to this and low BPs.  Continue aspirin 81 mg, heparin infusion, and atorvastatin 80 mg daily.   Levophed was ordered but never started.    # Acute systolic and diastolic heart failure: # Mild MR: Echo showed LVEF 45% with lateral hypokinesis.  Grade 1 diastolic dysfunction.  Mild MR.  Rachael Davis is euvolemic on exam.  She did not tolerate  metoprolol.  BP too low for ACE-I/ARB/ARNI.    # Hypertension:  Home HCTZ-triameterene held due to low BP.  Stopped metoprolol as above.   # Hyperlipidemia:  Atorvastatin 80 mg daily added this admission. She will need lipids and a CMP in 6 weeks.   # Diabetes:  Home oral agents are held.  She  was started on SSI.  ACHS glucose monitoring.   Signed, Chilton Si, MD  04/06/2017, 8:54 AM

## 2017-04-06 NOTE — Progress Notes (Signed)
[id number] Discussed importance of mobility and IS after surgery. Gave pt IS and she demonstrated correctly 1000-1250 ml. Wrote down how to view preop video, gave [facility] booklet and care guide. Pt's husband with many questions re at home recovery. Questions answered. Already discussed CRP 2 as pt asking about program. Will continue to follow. Please advise if pt to walk prior to surgery. Pt's husband will be available to help care for pt 24/7 after discharge.  Reviewed sternal precautions. Luetta Nutting RN BSN 04/06/2017 2:14 PM

## 2017-04-06 NOTE — Progress Notes (Signed)
Pt transported to xray with RN and transporter, [place], without complications. Vitals stable, pt education given. Pt returned to 2H23.

## 2017-04-07 ENCOUNTER — Inpatient Hospital Stay (HOSPITAL_COMMUNITY): Payer: Managed Care, Other (non HMO)

## 2017-04-07 DIAGNOSIS — R05 Cough: Secondary | ICD-10-CM

## 2017-04-07 DIAGNOSIS — D72829 Elevated white blood cell count, unspecified: Secondary | ICD-10-CM

## 2017-04-07 LAB — URINALYSIS, ROUTINE W REFLEX MICROSCOPIC
Bilirubin Urine: NEGATIVE
Glucose, UA: >=500 mg/dL — AB
Hgb urine dipstick: NEGATIVE
Ketones, ur: 5 mg/dL — AB
Leukocytes, UA: NEGATIVE
Nitrite: POSITIVE — AB
Protein, ur: NEGATIVE mg/dL
Specific Gravity, Urine: 1.026 (ref 1.005–1.030)
pH: 5 (ref 5.0–8.0)

## 2017-04-07 LAB — HEPARIN LEVEL (UNFRACTIONATED)
Heparin Unfractionated: <0.1 IU/mL — ABNORMAL LOW (ref 0.30–0.70)
Heparin Unfractionated: <0.1 IU/mL — ABNORMAL LOW (ref 0.30–0.70)
Heparin Unfractionated: 0.27 IU/mL — ABNORMAL LOW (ref 0.30–0.70)

## 2017-04-07 LAB — CBC
HCT: 36.4 % (ref 36.0–46.0)
Hemoglobin: 11.4 g/dL — ABNORMAL LOW (ref 12.0–15.0)
MCH: 22.9 pg — ABNORMAL LOW (ref 26.0–34.0)
MCHC: 31.3 g/dL (ref 30.0–36.0)
MCV: 73.2 fL — ABNORMAL LOW (ref 78.0–100.0)
Platelets: 175 10*3/uL (ref 150–400)
RBC: 4.97 MIL/uL (ref 3.87–5.11)
RDW: 15.8 % — ABNORMAL HIGH (ref 11.5–15.5)
WBC: 13.6 10*3/uL — ABNORMAL HIGH (ref 4.0–10.5)

## 2017-04-07 LAB — BASIC METABOLIC PANEL
Anion gap: 12 (ref 5–15)
BUN: 20 mg/dL (ref 6–20)
CO2: 22 mmol/L (ref 22–32)
Calcium: 8.5 mg/dL — ABNORMAL LOW (ref 8.9–10.3)
Chloride: 102 mmol/L (ref 101–111)
Creatinine, Ser: 1.25 mg/dL — ABNORMAL HIGH (ref 0.44–1.00)
GFR calc Af Amer: 53 mL/min — ABNORMAL LOW (ref >60)
GFR calc non Af Amer: 45 mL/min — ABNORMAL LOW (ref >60)
Glucose, Bld: 134 mg/dL — ABNORMAL HIGH (ref 65–99)
Potassium: 3.2 mmol/L — ABNORMAL LOW (ref 3.5–5.1)
Sodium: 136 mmol/L (ref 135–145)

## 2017-04-07 LAB — GLUCOSE, CAPILLARY
Glucose-Capillary: 108 mg/dL — ABNORMAL HIGH (ref 65–99)
Glucose-Capillary: 218 mg/dL — ABNORMAL HIGH (ref 65–99)
Glucose-Capillary: 146 mg/dL — ABNORMAL HIGH (ref 65–99)

## 2017-04-07 LAB — T4, FREE: Free T4: 1.07 ng/dL (ref 0.61–1.12)

## 2017-04-07 LAB — TROPONIN I: Troponin I: 25 ng/mL (ref <0.03)

## 2017-04-07 LAB — HEMOGLOBIN A1C
Hgb A1c MFr Bld: 8.2 % — ABNORMAL HIGH (ref 4.8–5.6)
Mean Plasma Glucose: 189 mg/dL

## 2017-04-07 MED ORDER — LEVOFLOXACIN 750 MG PO TABS
750.0000 mg | ORAL_TABLET | Freq: Every day | ORAL | Status: AC
  Administered 2017-04-07 – 2017-04-11 (×5): 750 mg via ORAL
  Filled 2017-04-07 (×5): qty 1

## 2017-04-07 MED ORDER — POTASSIUM CHLORIDE CRYS ER 20 MEQ PO TBCR
40.0000 meq | EXTENDED_RELEASE_TABLET | Freq: Once | ORAL | Status: AC
  Administered 2017-04-07: 40 meq via ORAL
  Filled 2017-04-07: qty 2

## 2017-04-07 MED ORDER — POTASSIUM CHLORIDE CRYS ER 20 MEQ PO TBCR
40.0000 meq | EXTENDED_RELEASE_TABLET | Freq: Two times a day (BID) | ORAL | Status: DC
  Administered 2017-04-07 – 2017-04-11 (×10): 40 meq via ORAL
  Filled 2017-04-07 (×10): qty 2

## 2017-04-07 NOTE — Progress Notes (Signed)
ANTICOAGULATION CONSULT NOTE - Follow Up Consult  Pharmacy Consult for Heparin Indication: 3V CAD plan CABG  No Known Allergies  Patient Measurements: Height: 5\' 3"  (160 cm) Weight: 202 lb 13.2 oz (92 kg) IBW/kg (Calculated) : 52.4 Heparin Dosing Weight: 74kg  Vital Signs: BP: 121/64 (05/04 2300) Pulse Rate: 83 (05/04 2300)  Labs:  Recent Labs  04/04/17 2042 04/04/17 2308 04/05/17 0450 04/05/17 0451 04/05/17 0708 04/05/17 1012  04/05/17 2211 04/06/17 0243 04/06/17 0805 04/07/17 0318  HGB 13.5  --   --  12.6  --   --   --   --  11.9*  --  11.4*  HCT 41.8  --   --  38.5  --   --   --   --  37.3  --  36.4  PLT 199  --   --  189  --   --   --   --  188  --  175  APTT 27  --   --   --   --   --   --   --   --   --   --   LABPROT 13.7  --   --   --   --   --   --   --   --   --   --   INR 1.05  --   --   --   --   --   --   --   --   --   --   HEPARINUNFRC  --   --   --   --   --   --   < > 0.36 0.38  --  <0.10*  CREATININE 1.60* 1.50*  --   --  1.43*  --   --   --  1.31*  --   --   TROPONINI 0.60* 0.97* 4.23*  --   --  10.92*  --   --   --  29.16*  --   < > = values in this interval not displayed.  Estimated Creatinine Clearance: 48.6 mL/min (A) (by C-G formula based on SCr of 1.31 mg/dL (H)).  Assessment: 60yof s/p STEMI found to have 3V CAD plan CABG next week. P2Y12 278 Currently on heparin drip 1200 uts/hr. Heparin level down to undetectable (previously at goal x 2 on this drip rate). Spoke with RN and no issues noted with line or infusion. CBC stable. No bleeding noted..  Goal of Therapy:  Heparin level 0.3-0.7 units/ml Monitor platelets by anticoagulation protocol: Yes   Plan:  Increase heparin to 1400 units/hr Will f/u 6hr heparin level  Christoper Fabian, PharmD, BCPS Clinical pharmacist, pager [phone] 04/07/2017 4:13 AM

## 2017-04-07 NOTE — Progress Notes (Signed)
ANTICOAGULATION CONSULT NOTE - Follow Up Consult  Pharmacy Consult for Heparin Indication: 3V CAD plan CABG  No Known Allergies  Patient Measurements: Height: 5\' 3"  (160 cm) Weight: 202 lb 9.6 oz (91.9 kg) IBW/kg (Calculated) : 52.4 Heparin Dosing Weight: 74kg  Vital Signs: Temp: 99.1 F (37.3 C) (05/05 1147) Temp Source: Oral (05/05 1147) BP: 102/60 (05/05 1440) Pulse Rate: 81 (05/05 0800)  Labs:  Recent Labs  04/04/17 2042  04/05/17 0451 04/05/17 0708 04/05/17 1012  04/06/17 0243 04/06/17 0805 04/07/17 0318 04/07/17 1144  HGB 13.5  --  12.6  --   --   --  11.9*  --  11.4*  --   HCT 41.8  --  38.5  --   --   --  37.3  --  36.4  --   PLT 199  --  189  --   --   --  188  --  175  --   APTT 27  --   --   --   --   --   --   --   --   --   LABPROT 13.7  --   --   --   --   --   --   --   --   --   INR 1.05  --   --   --   --   --   --   --   --   --   HEPARINUNFRC  --   --   --   --   --   < > 0.38  --  <0.10* <0.10*  CREATININE 1.60*  < >  --  1.43*  --   --  1.31*  --  1.25*  --   TROPONINI 0.60*  < >  --   --  10.92*  --   --  29.16* 25.00*  --   < > = values in this interval not displayed.  Estimated Creatinine Clearance: 50.9 mL/min (A) (by C-G formula based on SCr of 1.25 mg/dL (H)).     Assessment: 60yof s/p STEMI found to have 3V CAD plan CABG next week P2Y12 278 Currently on heparin drip 1400 uts/hr HL < 0.1 < goal despite rate increase.  Line checked and no swelling of arm or problem with IV.  CBC stable. No bleeding or infusion issues documented.  Goal of Therapy:  Heparin level 0.3-0.7 units/ml Monitor platelets by anticoagulation protocol: Yes   Plan:  1) Increase heparin at 1600 units/hr Check HL in 6hr  2) Daily heparin level/CBC 3) Monitor for s/sx of bleeding  Leota Sauers Pharm.D. CPP, BCPS Clinical Pharmacist [phone] 04/07/2017 3:06 PM

## 2017-04-07 NOTE — Progress Notes (Signed)
MD paged regarding morning potassium 3.2, awaiting return call

## 2017-04-07 NOTE — Progress Notes (Signed)
Progress Note  Patient Name: Rachael Davis Date of Encounter: 04/07/2017  Primary Cardiologist: new [facility])  Subjective   Feeling well. Reports cough that is non-productive.  She feels like she needs to get something up.  Inpatient Medications    Scheduled Meds: . aspirin EC  81 mg Oral Daily  . atorvastatin  80 mg Oral q1800  . insulin aspart  0-15 Units Subcutaneous TID WC  . insulin glargine  18 Units Subcutaneous BID  . potassium chloride  40 mEq Oral BID  . sodium chloride flush  3 mL Intravenous Q12H   Continuous Infusions: . sodium chloride 20 mL/hr at 04/06/17 0200  . sodium chloride    . sodium chloride 5 mL/hr at 04/07/17 0000  . heparin 1,400 Units/hr (04/07/17 0435)  . norepinephrine (LEVOPHED) Adult infusion Stopped (04/05/17 0315)   PRN Meds: sodium chloride, acetaminophen, ondansetron (ZOFRAN) IV, [START ON 04/13/2017] pneumococcal 23 valent vaccine, sodium chloride flush   Vital Signs    Vitals:   04/07/17 0420 04/07/17 0500 04/07/17 0600 04/07/17 0700  BP:  106/65 (!) 97/56 108/63  Pulse:  79 76 80  Resp:  (!) 26 (!) 26 (!) 28  Temp: 99.6 F (37.6 C)     TempSrc: Oral     SpO2:  96% 96% 96%  Weight:    91.9 kg (202 lb 9.6 oz)  Height:        Intake/Output Summary (Last 24 hours) at 04/07/17 0739 Last data filed at 04/07/17 0700  Gross per 24 hour  Intake           497.83 ml  Output             1650 ml  Net         -1152.17 ml   Filed Weights   04/05/17 0245 04/06/17 0300 04/07/17 0700  Weight: 93.7 kg (206 lb 9.1 oz) 92 kg (202 lb 13.2 oz) 91.9 kg (202 lb 9.6 oz)    Telemetry    Sinus rhythm. PVCs - Personally Reviewed  ECG    04/06/17: Sinus rhythm. Rate 83 bpm. Left axis deviation. Prior inferior infarct. Prior posterior infarct.   - Personally Reviewed  Physical Exam   GEN: No acute distress.   Neck: No JVD Cardiac: RRR, no murmurs, rubs, or gallops.  Respiratory: Clear to auscultation bilaterally.  No crackles, wheezes or  rhonchi GI: Soft, nontender, non-distended  MS: No edema; No deformity. Neuro:  Nonfocal  Psych: Normal affect   Labs    Chemistry  Recent Labs Lab 04/04/17 2308 04/05/17 0708 04/06/17 0243 04/07/17 0318  NA 135 137 137 136  K 4.3 4.1 3.6 3.2*  CL 100* 105 104 102  CO2 23 22 22 22   GLUCOSE 209* 155* 166* 134*  BUN 25* 25* 25* 20  CREATININE 1.50* 1.43* 1.31* 1.25*  CALCIUM 9.4 8.8* 8.9 8.5*  PROT 7.2  --  6.8  --   ALBUMIN 3.7  --  3.0*  --   AST 29  --  173*  --   ALT 20  --  40  --   ALKPHOS 101  --  89  --   BILITOT 0.6  --  0.7  --   GFRNONAA 36* 39* 43* 45*  GFRAA 42* 45* 50* 53*  ANIONGAP 12 10 11 12      Hematology  Recent Labs Lab 04/05/17 0451 04/06/17 0243 04/07/17 0318  WBC 10.3 10.3 13.6*  RBC 5.24* 5.08 4.97  HGB 12.6  11.9* 11.4*  HCT 38.5 37.3 36.4  MCV 73.5* 73.4* 73.2*  MCH 24.0* 23.4* 22.9*  MCHC 32.7 31.9 31.3  RDW 16.3* 15.9* 15.8*  PLT 189 188 175    Cardiac Enzymes  Recent Labs Lab 04/05/17 0450 04/05/17 1012 04/06/17 0805 04/07/17 0318  TROPONINI 4.23* 10.92* 29.16* 25.00*     Recent Labs Lab 04/04/17 2100  TROPIPOC 0.50*     BNPNo results for input(s): BNP, PROBNP in the last 168 hours.   DDimer No results for input(s): DDIMER in the last 168 hours.   Radiology    Dg Chest 2 View  Result Date: 04/06/2017 CLINICAL DATA:  Preoperative chest radiograph. Coronary artery disease with preoperative evaluation for coronary artery bypass grafting EXAM: CHEST  2 VIEW COMPARISON:  Apr 04, 2017 FINDINGS: There is no edema or consolidation. Heart is upper normal in size with pulmonary vascularity within normal limits. No adenopathy. There is degenerative change in the thoracic spine. IMPRESSION: No edema or consolidation. Electronically Signed   By: Bretta Bang III M.D.   On: 04/06/2017 07:26    Cardiac Studies   LHC 04/04/17: Prox LAD lesion, 75 %stenosed.  Prox LAD to Mid LAD lesion, 65 %stenosed.  Mid LAD to Dist  LAD lesion, 55 %stenosed.  Ramus lesion, 90 %stenosed.  Lat Ramus lesion, 50 %stenosed.  Prox Cx lesion, 100 %stenosed.  Prox RCA to Mid RCA lesion, 95 %stenosed.  There is moderate left ventricular systolic dysfunction.  LV end diastolic pressure is mildly elevated.  The left ventricular ejection fraction is 35-45% by visual estimate.  There is mild (2+) mitral regurgitation.   1. Severe 3 vessel obstructive CAD 2. Moderate LV dysfunction 3. Mildly elevated LV EDP  Echo 04/05/17: Study Conclusions  - Left ventricle: LVEF is approximately 45% with hypokinesis of the   lateral wall The cavity size was normal. Wall thickness was   normal. Doppler parameters are consistent with abnormal left   ventricular relaxation (grade 1 diastolic dysfunction). - Mitral valve: There was mild regurgitation. - Pulmonary arteries: PA peak pressure: 36 mm Hg (S). Patient Profile     61 y.o. female With hypertension, hyperlipidemia, and diabetes here with ST elevation myocardial infarction. Found to have 3 vessel CAD on cardiac catheterization.   Assessment & Plan    # CAD: # STEMI: [name] presented with a completed infarct. She was found to have 3 vessel CAD on cardiac catheterization. She was seen by Dr. Morton Peters and will go for CABG mid next week.  Blood pressure remains stable after stopping metoprolol.  Continue aspirin 81 mg, heparin infusion, and atorvastatin 80 mg daily.  Troponin peaked at 29.  # Acute systolic and diastolic heart failure: # Mild MR: Echo showed LVEF 45% with lateral hypokinesis.  Grade 1 diastolic dysfunction.  Mild MR.  [name] is euvolemic on exam.  She did not tolerate metoprolol.  BP too low for ACE-I/ARB/ARNI.    # Leukocytosis:  Ms. Elvis Coil WBC is increasing and she reports a cough.  No fever or dysuria.  We will check a CXR and empirically start Levofloxacin 750 mg po daily x5 days.  Today is day 1/5.  # Hypertension:  Home HCTZ-triameterene held  due to low BP.    # Hyperlipidemia:  Atorvastatin 80 mg daily added this admission. She will need lipids and a CMP in 6 weeks.   # Diabetes:  Home oral agents are held.  She was started on SSI.  ACHS glucose monitoring.   #  Hypokalemia: Supplement potassium  Signed, Chilton Si, MD  04/07/2017, 7:39 AM

## 2017-04-08 ENCOUNTER — Inpatient Hospital Stay (HOSPITAL_COMMUNITY): Payer: Managed Care, Other (non HMO)

## 2017-04-08 DIAGNOSIS — E1121 Type 2 diabetes mellitus with diabetic nephropathy: Secondary | ICD-10-CM

## 2017-04-08 LAB — CBC
HCT: 39.2 % (ref 36.0–46.0)
Hemoglobin: 12.3 g/dL (ref 12.0–15.0)
MCH: 23.3 pg — ABNORMAL LOW (ref 26.0–34.0)
MCHC: 31.4 g/dL (ref 30.0–36.0)
MCV: 74.2 fL — ABNORMAL LOW (ref 78.0–100.0)
Platelets: 193 10*3/uL (ref 150–400)
RBC: 5.28 MIL/uL — ABNORMAL HIGH (ref 3.87–5.11)
RDW: 15.8 % — ABNORMAL HIGH (ref 11.5–15.5)
WBC: 13.8 10*3/uL — ABNORMAL HIGH (ref 4.0–10.5)

## 2017-04-08 LAB — BASIC METABOLIC PANEL
Anion gap: 8 (ref 5–15)
BUN: 21 mg/dL — ABNORMAL HIGH (ref 6–20)
CO2: 24 mmol/L (ref 22–32)
Calcium: 8.9 mg/dL (ref 8.9–10.3)
Chloride: 104 mmol/L (ref 101–111)
Creatinine, Ser: 1.4 mg/dL — ABNORMAL HIGH (ref 0.44–1.00)
GFR calc Af Amer: 46 mL/min — ABNORMAL LOW (ref >60)
GFR calc non Af Amer: 40 mL/min — ABNORMAL LOW (ref >60)
Glucose, Bld: 133 mg/dL — ABNORMAL HIGH (ref 65–99)
Potassium: 3.9 mmol/L (ref 3.5–5.1)
Sodium: 136 mmol/L (ref 135–145)

## 2017-04-08 LAB — HEPARIN LEVEL (UNFRACTIONATED): Heparin Unfractionated: 0.4 IU/mL (ref 0.30–0.70)

## 2017-04-08 LAB — GLUCOSE, CAPILLARY
Glucose-Capillary: 136 mg/dL — ABNORMAL HIGH (ref 65–99)
Glucose-Capillary: 107 mg/dL — ABNORMAL HIGH (ref 65–99)
Glucose-Capillary: 148 mg/dL — ABNORMAL HIGH (ref 65–99)
Glucose-Capillary: 120 mg/dL — ABNORMAL HIGH (ref 65–99)
Glucose-Capillary: 198 mg/dL — ABNORMAL HIGH (ref 65–99)

## 2017-04-08 NOTE — Progress Notes (Signed)
ANTICOAGULATION CONSULT NOTE - Follow Up Consult  Pharmacy Consult for Heparin Indication: 3V CAD plan CABG  No Known Allergies  Patient Measurements: Height: 5\' 3"  (160 cm) Weight: 204 lb 3.2 oz (92.6 kg) IBW/kg (Calculated) : 52.4 Heparin Dosing Weight: 74kg  Vital Signs: Temp: 98.7 F (37.1 C) (05/06 0814) Temp Source: Oral (05/06 0814) BP: 103/69 (05/06 0950) Pulse Rate: 80 (05/06 0800)  Labs:  Recent Labs  04/06/17 0243 04/06/17 0805 04/07/17 0318 04/07/17 1144 04/07/17 2055 04/08/17 0200 04/08/17 0647  HGB 11.9*  --  11.4*  --   --  12.3  --   HCT 37.3  --  36.4  --   --  39.2  --   PLT 188  --  175  --   --  193  --   HEPARINUNFRC 0.38  --  <0.10* <0.10* 0.27*  --  0.40  CREATININE 1.31*  --  1.25*  --   --  1.40*  --   TROPONINI  --  29.16* 25.00*  --   --   --   --     Estimated Creatinine Clearance: 45.6 mL/min (A) (by C-G formula based on SCr of 1.4 mg/dL (H)).     Assessment: 60yof s/p STEMI found to have 3V CAD plan CABG next week P2Y12 278 Currently on heparin drip 1750 uts/hr HL 0.4 at goal.  Line checked and no swelling of arm or problem with IV.  CBC stable. No bleeding or infusion issues documented.  Goal of Therapy:  Heparin level 0.3-0.7 units/ml Monitor platelets by anticoagulation protocol: Yes   Plan:  1) Continue heparin at 1600 units/hr 2) Daily heparin level/CBC 3) Monitor for s/sx of bleeding  Leota Sauers Pharm.D. CPP, BCPS Clinical Pharmacist [phone] 04/08/2017 11:02 AM

## 2017-04-08 NOTE — Progress Notes (Signed)
Progress Note  Patient Name: Renne Musca Date of Encounter: 04/08/2017  Primary Cardiologist: new [facility])  Subjective   Feeling well. Cough improving since starting levofloxacin. Wants to ambulate.   Inpatient Medications    Scheduled Meds: . aspirin EC  81 mg Oral Daily  . atorvastatin  80 mg Oral q1800  . insulin aspart  0-15 Units Subcutaneous TID WC  . insulin glargine  18 Units Subcutaneous BID  . levofloxacin  750 mg Oral Daily  . potassium chloride  40 mEq Oral BID  . sodium chloride flush  3 mL Intravenous Q12H   Continuous Infusions: . sodium chloride 20 mL/hr at 04/06/17 0200  . sodium chloride    . sodium chloride Stopped (04/08/17 0700)  . heparin 1,750 Units/hr (04/08/17 0700)  . norepinephrine (LEVOPHED) Adult infusion Stopped (04/05/17 0315)   PRN Meds: sodium chloride, acetaminophen, ondansetron (ZOFRAN) IV, sodium chloride flush   Vital Signs    Vitals:   04/08/17 0400 04/08/17 0500 04/08/17 0600 04/08/17 0700  BP: (!) 98/59 (!) 97/59 101/61   Pulse: 77 75 73   Resp: 19 (!) 24 (!) 25 (!) 26  Temp:      TempSrc:      SpO2: 98% 99% 97%   Weight:    94.2 kg (207 lb 10.8 oz)  Height:        Intake/Output Summary (Last 24 hours) at 04/08/17 0801 Last data filed at 04/08/17 0700  Gross per 24 hour  Intake          1068.88 ml  Output             1300 ml  Net          -231.12 ml   Filed Weights   04/06/17 0300 04/07/17 0700 04/08/17 0700  Weight: 92 kg (202 lb 13.2 oz) 91.9 kg (202 lb 9.6 oz) 94.2 kg (207 lb 10.8 oz)    Telemetry    Sinus rhythm. No events.  Personally Reviewed  ECG    04/06/17: Sinus rhythm. Rate 83 bpm. Left axis deviation. Prior inferior infarct. Prior posterior infarct.   - Personally Reviewed  Physical Exam   GEN: No acute distress.   Neck: No JVD Cardiac: RRR, no murmurs, rubs, or gallops.  Respiratory: Mild Rhonchi. No wheezes or crackles. GI: Soft, nontender, non-distended  MS: No edema; No  deformity. Neuro:  Nonfocal  Psych: Normal affect   Labs    Chemistry  Recent Labs Lab 04/04/17 2308  04/06/17 0243 04/07/17 0318 04/08/17 0200  NA 135  < > 137 136 136  K 4.3  < > 3.6 3.2* 3.9  CL 100*  < > 104 102 104  CO2 23  < > 22 22 24   GLUCOSE 209*  < > 166* 134* 133*  BUN 25*  < > 25* 20 21*  CREATININE 1.50*  < > 1.31* 1.25* 1.40*  CALCIUM 9.4  < > 8.9 8.5* 8.9  PROT 7.2  --  6.8  --   --   ALBUMIN 3.7  --  3.0*  --   --   AST 29  --  173*  --   --   ALT 20  --  40  --   --   ALKPHOS 101  --  89  --   --   BILITOT 0.6  --  0.7  --   --   GFRNONAA 36*  < > 43* 45* 40*  GFRAA 42*  < >  50* 53* 46*  ANIONGAP 12  < > 11 12 8   < > = values in this interval not displayed.   Hematology  Recent Labs Lab 04/06/17 0243 04/07/17 0318 04/08/17 0200  WBC 10.3 13.6* 13.8*  RBC 5.08 4.97 5.28*  HGB 11.9* 11.4* 12.3  HCT 37.3 36.4 39.2  MCV 73.4* 73.2* 74.2*  MCH 23.4* 22.9* 23.3*  MCHC 31.9 31.3 31.4  RDW 15.9* 15.8* 15.8*  PLT 188 175 193    Cardiac Enzymes  Recent Labs Lab 04/05/17 0450 04/05/17 1012 04/06/17 0805 04/07/17 0318  TROPONINI 4.23* 10.92* 29.16* 25.00*     Recent Labs Lab 04/04/17 2100  TROPIPOC 0.50*     BNPNo results for input(s): BNP, PROBNP in the last 168 hours.   DDimer No results for input(s): DDIMER in the last 168 hours.   Radiology    Dg Chest 2 View  Result Date: 04/07/2017 CLINICAL DATA:  Dyspnea EXAM: CHEST  2 VIEW COMPARISON:  Chest radiograph from one day prior. FINDINGS: Stable cardiomediastinal silhouette with top-normal heart size. No pneumothorax. No pleural effusion. Patchy bilateral lower lobe opacities, not appreciably changed. IMPRESSION: Patchy bilateral lower lobe lung opacities, not appreciably changed, which may represent pneumonia, aspiration and/ or atelectasis. Recommend follow-up PA and lateral post treatment chest radiographs in 4-6 weeks. Electronically Signed   By: Delbert Phenix M.D.   On: 04/07/2017  10:20    Cardiac Studies   LHC 04/04/17: Prox LAD lesion, 75 %stenosed.  Prox LAD to Mid LAD lesion, 65 %stenosed.  Mid LAD to Dist LAD lesion, 55 %stenosed.  Ramus lesion, 90 %stenosed.  Lat Ramus lesion, 50 %stenosed.  Prox Cx lesion, 100 %stenosed.  Prox RCA to Mid RCA lesion, 95 %stenosed.  There is moderate left ventricular systolic dysfunction.  LV end diastolic pressure is mildly elevated.  The left ventricular ejection fraction is 35-45% by visual estimate.  There is mild (2+) mitral regurgitation.   1. Severe 3 vessel obstructive CAD 2. Moderate LV dysfunction 3. Mildly elevated LV EDP  Echo 04/05/17: Study Conclusions  - Left ventricle: LVEF is approximately 45% with hypokinesis of the   lateral wall The cavity size was normal. Wall thickness was   normal. Doppler parameters are consistent with abnormal left   ventricular relaxation (grade 1 diastolic dysfunction). - Mitral valve: There was mild regurgitation. - Pulmonary arteries: PA peak pressure: 36 mm Hg (S). Patient Profile     61 y.o. female With hypertension, hyperlipidemia, and diabetes here with ST elevation myocardial infarction. Found to have 3 vessel CAD on cardiac catheterization.   Assessment & Plan    # CAD: # STEMI: [name] presented with a completed infarct. She was found to have 3 vessel CAD on cardiac catheterization. She was seen by Dr. Morton Peters and will go for CABG mid week.  Blood pressure remains stable after stopping metoprolol.  Continue aspirin 81 mg, heparin infusion, and atorvastatin 80 mg daily.  Troponin peaked at 29.  # Acute systolic and diastolic heart failure: # Mild MR: Echo showed LVEF 45% with lateral hypokinesis.  Grade 1 diastolic dysfunction.  Mild MR.  [name] is euvolemic on exam.  She did not tolerate metoprolol.  BP too low for ACE-I/ARB/ARNI.    # CAP:  Chest x-ray showed bilateral patchy opacities and she has cough with mildly elevated WBC. Day 2 of 5  for levofloxacin.    # Hypertension:  Home HCTZ-triameterene held due to low BP.  She did not  tolerate low dose beta blocker.   # Hyperlipidemia:  Atorvastatin 80 mg daily added this admission. She will need lipids and a CMP in 6 weeks.   # Diabetes:  Home oral agents are held.  She was started on SSI.  ACHS glucose monitoring.   # Hypokalemia: Supplement potassium  Signed, Chilton Si, MD  04/08/2017, 8:01 AM

## 2017-04-08 NOTE — Progress Notes (Signed)
ANTICOAGULATION CONSULT NOTE - Follow Up Consult  Pharmacy Consult for Heparin Indication: 3V CAD plan CABG  No Known Allergies  Patient Measurements: Height: 5\' 3"  (160 cm) Weight: 202 lb 9.6 oz (91.9 kg) IBW/kg (Calculated) : 52.4 Heparin Dosing Weight: 74kg  Vital Signs: Temp: 99.7 F (37.6 C) (05/05 2300) Temp Source: Oral (05/05 2300) BP: 103/79 (05/05 1739)  Labs:  Recent Labs  04/05/17 0451 04/05/17 0708 04/05/17 1012  04/06/17 0243 04/06/17 0805 04/07/17 0318 04/07/17 1144 04/07/17 2055  HGB 12.6  --   --   --  11.9*  --  11.4*  --   --   HCT 38.5  --   --   --  37.3  --  36.4  --   --   PLT 189  --   --   --  188  --  175  --   --   HEPARINUNFRC  --   --   --   < > 0.38  --  <0.10* <0.10* 0.27*  CREATININE  --  1.43*  --   --  1.31*  --  1.25*  --   --   TROPONINI  --   --  10.92*  --   --  29.16* 25.00*  --   --   < > = values in this interval not displayed.  Estimated Creatinine Clearance: 50.9 mL/min (A) (by C-G formula based on SCr of 1.25 mg/dL (H)).   Assessment: 60yof s/p STEMI found to have 3V CAD plan CABG next week P2Y12 278 Currently on heparin drip 1600 uts/hr HL. Heparin level remains slightly subtherapeutic (0.27) on gtt at 1600 units/hr. No issues with line or bleeding reported per RN.  Goal of Therapy:  Heparin level 0.3-0.7 units/ml Monitor platelets by anticoagulation protocol: Yes   Plan:  Increase heparin at 1750 units/hr Check HL in 6hr   Christoper Fabian, PharmD, BCPS Clinical pharmacist, pager [phone] 04/08/2017 12:13 AM

## 2017-04-09 ENCOUNTER — Inpatient Hospital Stay (HOSPITAL_COMMUNITY): Payer: Managed Care, Other (non HMO)

## 2017-04-09 ENCOUNTER — Encounter (HOSPITAL_COMMUNITY): Payer: Managed Care, Other (non HMO)

## 2017-04-09 DIAGNOSIS — Z01810 Encounter for preprocedural cardiovascular examination: Secondary | ICD-10-CM

## 2017-04-09 DIAGNOSIS — I25110 Atherosclerotic heart disease of native coronary artery with unstable angina pectoris: Secondary | ICD-10-CM

## 2017-04-09 LAB — CBC
HCT: 35.9 % — ABNORMAL LOW (ref 36.0–46.0)
Hemoglobin: 11.2 g/dL — ABNORMAL LOW (ref 12.0–15.0)
MCH: 22.9 pg — ABNORMAL LOW (ref 26.0–34.0)
MCHC: 31.2 g/dL (ref 30.0–36.0)
MCV: 73.3 fL — ABNORMAL LOW (ref 78.0–100.0)
Platelets: 159 10*3/uL (ref 150–400)
RBC: 4.9 MIL/uL (ref 3.87–5.11)
RDW: 15.8 % — ABNORMAL HIGH (ref 11.5–15.5)
WBC: 10.8 10*3/uL — ABNORMAL HIGH (ref 4.0–10.5)

## 2017-04-09 LAB — BASIC METABOLIC PANEL
Anion gap: 9 (ref 5–15)
BUN: 19 mg/dL (ref 6–20)
CO2: 20 mmol/L — ABNORMAL LOW (ref 22–32)
Calcium: 8.7 mg/dL — ABNORMAL LOW (ref 8.9–10.3)
Chloride: 107 mmol/L (ref 101–111)
Creatinine, Ser: 1.21 mg/dL — ABNORMAL HIGH (ref 0.44–1.00)
GFR calc Af Amer: 55 mL/min — ABNORMAL LOW (ref >60)
GFR calc non Af Amer: 47 mL/min — ABNORMAL LOW (ref >60)
Glucose, Bld: 150 mg/dL — ABNORMAL HIGH (ref 65–99)
Potassium: 4.3 mmol/L (ref 3.5–5.1)
Sodium: 136 mmol/L (ref 135–145)

## 2017-04-09 LAB — GLUCOSE, CAPILLARY
Glucose-Capillary: 114 mg/dL — ABNORMAL HIGH (ref 65–99)
Glucose-Capillary: 186 mg/dL — ABNORMAL HIGH (ref 65–99)
Glucose-Capillary: 173 mg/dL — ABNORMAL HIGH (ref 65–99)
Glucose-Capillary: 195 mg/dL — ABNORMAL HIGH (ref 65–99)

## 2017-04-09 LAB — HEPARIN LEVEL (UNFRACTIONATED): Heparin Unfractionated: 0.45 IU/mL (ref 0.30–0.70)

## 2017-04-09 LAB — T3, FREE: T3, Free: 2.2 pg/mL (ref 2.0–4.4)

## 2017-04-09 MED ORDER — INSULIN GLARGINE 100 UNIT/ML ~~LOC~~ SOLN
20.0000 [IU] | Freq: Two times a day (BID) | SUBCUTANEOUS | Status: DC
  Administered 2017-04-09 – 2017-04-11 (×5): 20 [IU] via SUBCUTANEOUS
  Filled 2017-04-09 (×7): qty 0.2

## 2017-04-09 NOTE — Progress Notes (Addendum)
DAILY PROGRESS NOTE   Patient Name: Rachael Davis Date of Encounter: 04/09/2017  Hospital Problem List   Principal Problem:   STEMI involving left circumflex coronary artery [facility]) Active Problems:   Coronary artery disease involving native heart without angina pectoris   Essential hypertension   Diabetes mellitus with nephropathy [facility])    Chief Complaint   Short of breath with activity  Subjective   No chest pain - short of breath walking around yesterday. Plan for CABG this week. Leukocytosis is improving.  Objective   Vitals:   04/09/17 0400 04/09/17 0500 04/09/17 0600 04/09/17 0700  BP: 91/61 97/62 104/70 (!) 98/59  Pulse:      Resp: (!) 21 (!) 32 (!) 22 (!) 21  Temp: 99.1 F (37.3 C)     TempSrc: Oral     SpO2: 99%     Weight: 202 lb (91.6 kg)     Height:        Intake/Output Summary (Last 24 hours) at 04/09/17 0803 Last data filed at 04/09/17 0400  Gross per 24 hour  Intake             1190 ml  Output             1400 ml  Net             -210 ml   Filed Weights   04/07/17 0700 04/08/17 0820 04/09/17 0400  Weight: 202 lb 9.6 oz (91.9 kg) 204 lb 3.2 oz (92.6 kg) 202 lb (91.6 kg)    Physical Exam   General appearance: alert and no distress Lungs: clear to auscultation bilaterally Heart: regular rate and rhythm Extremities: extremities normal, atraumatic, no cyanosis or edema Neurologic: Grossly normal  Inpatient Medications    Scheduled Meds: . aspirin EC  81 mg Oral Daily  . atorvastatin  80 mg Oral q1800  . insulin aspart  0-15 Units Subcutaneous TID WC  . insulin glargine  18 Units Subcutaneous BID  . levofloxacin  750 mg Oral Daily  . potassium chloride  40 mEq Oral BID  . sodium chloride flush  3 mL Intravenous Q12H    Continuous Infusions: . sodium chloride 20 mL/hr at 04/06/17 0200  . sodium chloride    . sodium chloride Stopped (04/08/17 0700)  . heparin 1,750 Units/hr (04/09/17 0230)  . norepinephrine (LEVOPHED) Adult infusion  Stopped (04/05/17 0315)    PRN Meds: sodium chloride, acetaminophen, ondansetron (ZOFRAN) IV, sodium chloride flush   Labs   Results for orders placed or performed during the hospital encounter of 04/04/17 (from the past 48 hour(s))  Urinalysis, Routine w reflex microscopic     Status: Abnormal   Collection Time: 04/07/17  8:20 AM  Result Value Ref Range   Color, Urine YELLOW YELLOW   APPearance CLEAR CLEAR   Specific Gravity, Urine 1.026 1.005 - 1.030   pH 5.0 5.0 - 8.0   Glucose, UA >=500 (A) NEGATIVE mg/dL   Hgb urine dipstick NEGATIVE NEGATIVE   Bilirubin Urine NEGATIVE NEGATIVE   Ketones, ur 5 (A) NEGATIVE mg/dL   Protein, ur NEGATIVE NEGATIVE mg/dL   Nitrite POSITIVE (A) NEGATIVE   Leukocytes, UA NEGATIVE NEGATIVE   RBC / HPF 0-5 0 - 5 RBC/hpf   WBC, UA 0-5 0 - 5 WBC/hpf   Bacteria, UA FEW (A) NONE SEEN   Squamous Epithelial / LPF 0-5 (A) NONE SEEN   Mucous PRESENT   Glucose, capillary     Status: Abnormal  Collection Time: 04/07/17  8:32 AM  Result Value Ref Range   Glucose-Capillary 108 (H) 65 - 99 mg/dL   Comment 1 Capillary Specimen    Comment 2 Notify RN    Comment 3 Document in Chart   Heparin level (unfractionated)     Status: Abnormal   Collection Time: 04/07/17 11:44 AM  Result Value Ref Range   Heparin Unfractionated <0.10 (L) 0.30 - 0.70 IU/mL    Comment:        IF HEPARIN RESULTS ARE BELOW EXPECTED VALUES, AND PATIENT DOSAGE HAS BEEN CONFIRMED, SUGGEST FOLLOW UP TESTING OF ANTITHROMBIN III LEVELS.   Glucose, capillary     Status: Abnormal   Collection Time: 04/07/17 11:45 AM  Result Value Ref Range   Glucose-Capillary 218 (H) 65 - 99 mg/dL   Comment 1 Capillary Specimen    Comment 2 Notify RN    Comment 3 Document in Chart   Glucose, capillary     Status: Abnormal   Collection Time: 04/07/17  4:25 PM  Result Value Ref Range   Glucose-Capillary 146 (H) 65 - 99 mg/dL   Comment 1 Capillary Specimen    Comment 2 Notify RN    Comment 3 Document  in Chart   Heparin level (unfractionated)     Status: Abnormal   Collection Time: 04/07/17  8:55 PM  Result Value Ref Range   Heparin Unfractionated 0.27 (L) 0.30 - 0.70 IU/mL    Comment:        IF HEPARIN RESULTS ARE BELOW EXPECTED VALUES, AND PATIENT DOSAGE HAS BEEN CONFIRMED, SUGGEST FOLLOW UP TESTING OF ANTITHROMBIN III LEVELS.   Glucose, capillary     Status: Abnormal   Collection Time: 04/07/17  9:59 PM  Result Value Ref Range   Glucose-Capillary 136 (H) 65 - 99 mg/dL   Comment 1 Capillary Specimen    Comment 2 Notify RN   CBC     Status: Abnormal   Collection Time: 04/08/17  2:00 AM  Result Value Ref Range   WBC 13.8 (H) 4.0 - 10.5 K/uL   RBC 5.28 (H) 3.87 - 5.11 MIL/uL   Hemoglobin 12.3 12.0 - 15.0 g/dL   HCT 39.2 36.0 - 46.0 %   MCV 74.2 (L) 78.0 - 100.0 fL   MCH 23.3 (L) 26.0 - 34.0 pg   MCHC 31.4 30.0 - 36.0 g/dL   RDW 15.8 (H) 11.5 - 15.5 %   Platelets 193 150 - 400 K/uL  Basic metabolic panel     Status: Abnormal   Collection Time: 04/08/17  2:00 AM  Result Value Ref Range   Sodium 136 135 - 145 mmol/L   Potassium 3.9 3.5 - 5.1 mmol/L    Comment: DELTA CHECK NOTED   Chloride 104 101 - 111 mmol/L   CO2 24 22 - 32 mmol/L   Glucose, Bld 133 (H) 65 - 99 mg/dL   BUN 21 (H) 6 - 20 mg/dL   Creatinine, Ser 1.40 (H) 0.44 - 1.00 mg/dL   Calcium 8.9 8.9 - 10.3 mg/dL   GFR calc non Af Amer 40 (L) >60 mL/min   GFR calc Af Amer 46 (L) >60 mL/min    Comment: (NOTE) The eGFR has been calculated using the CKD EPI equation. This calculation has not been validated in all clinical situations. eGFR's persistently <60 mL/min signify possible Chronic Kidney Disease.    Anion gap 8 5 - 15  Heparin level (unfractionated)     Status: None   Collection Time: 04/08/17  6:47 AM  Result Value Ref Range   Heparin Unfractionated 0.40 0.30 - 0.70 IU/mL    Comment:        IF HEPARIN RESULTS ARE BELOW EXPECTED VALUES, AND PATIENT DOSAGE HAS BEEN CONFIRMED, SUGGEST FOLLOW UP  TESTING OF ANTITHROMBIN III LEVELS.   Glucose, capillary     Status: Abnormal   Collection Time: 04/08/17  8:13 AM  Result Value Ref Range   Glucose-Capillary 107 (H) 65 - 99 mg/dL   Comment 1 Notify RN   Glucose, capillary     Status: Abnormal   Collection Time: 04/08/17 11:56 AM  Result Value Ref Range   Glucose-Capillary 148 (H) 65 - 99 mg/dL   Comment 1 Notify RN   Glucose, capillary     Status: Abnormal   Collection Time: 04/08/17  4:50 PM  Result Value Ref Range   Glucose-Capillary 120 (H) 65 - 99 mg/dL   Comment 1 Notify RN   Glucose, capillary     Status: Abnormal   Collection Time: 04/08/17  9:23 PM  Result Value Ref Range   Glucose-Capillary 198 (H) 65 - 99 mg/dL   Comment 1 Capillary Specimen   CBC     Status: Abnormal   Collection Time: 04/09/17  1:52 AM  Result Value Ref Range   WBC 10.8 (H) 4.0 - 10.5 K/uL   RBC 4.90 3.87 - 5.11 MIL/uL   Hemoglobin 11.2 (L) 12.0 - 15.0 g/dL   HCT 35.9 (L) 36.0 - 46.0 %   MCV 73.3 (L) 78.0 - 100.0 fL   MCH 22.9 (L) 26.0 - 34.0 pg   MCHC 31.2 30.0 - 36.0 g/dL   RDW 15.8 (H) 11.5 - 15.5 %   Platelets 159 150 - 400 K/uL  Basic metabolic panel     Status: Abnormal   Collection Time: 04/09/17  1:52 AM  Result Value Ref Range   Sodium 136 135 - 145 mmol/L   Potassium 4.3 3.5 - 5.1 mmol/L   Chloride 107 101 - 111 mmol/L   CO2 20 (L) 22 - 32 mmol/L   Glucose, Bld 150 (H) 65 - 99 mg/dL   BUN 19 6 - 20 mg/dL   Creatinine, Ser 1.21 (H) 0.44 - 1.00 mg/dL   Calcium 8.7 (L) 8.9 - 10.3 mg/dL   GFR calc non Af Amer 47 (L) >60 mL/min   GFR calc Af Amer 55 (L) >60 mL/min    Comment: (NOTE) The eGFR has been calculated using the CKD EPI equation. This calculation has not been validated in all clinical situations. eGFR's persistently <60 mL/min signify possible Chronic Kidney Disease.    Anion gap 9 5 - 15  Heparin level (unfractionated)     Status: None   Collection Time: 04/09/17  1:52 AM  Result Value Ref Range   Heparin  Unfractionated 0.45 0.30 - 0.70 IU/mL    Comment:        IF HEPARIN RESULTS ARE BELOW EXPECTED VALUES, AND PATIENT DOSAGE HAS BEEN CONFIRMED, SUGGEST FOLLOW UP TESTING OF ANTITHROMBIN III LEVELS.     ECG   None today  Telemetry   Sinus rhythm - Personally Reviewed  Radiology    Dg Chest 2 View  Result Date: 04/07/2017 CLINICAL DATA:  Dyspnea EXAM: CHEST  2 VIEW COMPARISON:  Chest radiograph from one day prior. FINDINGS: Stable cardiomediastinal silhouette with top-normal heart size. No pneumothorax. No pleural effusion. Patchy bilateral lower lobe opacities, not appreciably changed. IMPRESSION: Patchy bilateral lower lobe lung opacities, not appreciably  changed, which may represent pneumonia, aspiration and/ or atelectasis. Recommend follow-up PA and lateral post treatment chest radiographs in 4-6 weeks. Electronically Signed   By: Ilona Sorrel M.D.   On: 04/07/2017 10:20    Cardiac Studies   Indications:      Cardiomyopathy - ischemic 414.8.  ------------------------------------------------------------------- History:   Risk factors:  Hypertension. Diabetes mellitus.  ------------------------------------------------------------------- Study Conclusions  - Left ventricle: LVEF is approximately 45% with hypokinesis of the   lateral wall The cavity size was normal. Wall thickness was   normal. Doppler parameters are consistent with abnormal left   ventricular relaxation (grade 1 diastolic dysfunction). - Mitral valve: There was mild regurgitation. - Pulmonary arteries: PA peak pressure: 36 mm Hg (S).  Assessment   1. Principal Problem: 2.   STEMI involving left circumflex coronary artery ([place]) 3. Active Problems: 4.   Coronary artery disease involving native heart without angina pectoris 5.   Essential hypertension 6.   Diabetes mellitus with nephropathy ([place]) 7.   Plan   1. No complaints. Encouraged ambulation today. Remains on heparin, no bleeding. Day 3/5 of  levofloxacin for CAP. Leukocytosis is improving. Appears euvolemic on exam. BP remains soft- unable to tolerate BB. On SSI - glucose is reasonable. Creatinine improving - potassium at goal. Plan for CABG this week.  Time Spent Directly with Patient:  15 minutes  Length of Stay:  LOS: 5 days   Pixie Casino, MD, [place]  Attending Cardiologist  Direct Dial: [phone]  Fax: [phone]  Website:  [email or web address] 04/09/2017, 8:03 AM

## 2017-04-09 NOTE — Progress Notes (Signed)
CARDIAC REHAB PHASE I   PRE:  Rate/Rhythm: 107 ST  BP:  Supine:   Sitting: 111/59  Standing:    SaO2: 98%RA  MODE:  Ambulation: 120 ft   POST:  Rate/Rhythm: 114 ST  BP:  Supine:   Sitting: 108/67  Standing:    SaO2: 98%RA 0952-1010 Pt walked 120 ft on RA with asst x 1 with steady gait. Cut walk short as she stated her legs felt weak. Denied CP or SOB. Back to recliner after walk. VSS.   Luetta Nutting, RN BSN  04/09/2017 10:06 AM

## 2017-04-09 NOTE — Progress Notes (Signed)
ANTICOAGULATION CONSULT NOTE - Follow Up Consult  Pharmacy Consult for Heparin Indication: 3V CAD plan CABG  No Known Allergies  Patient Measurements: Height: 5\' 3"  (160 cm) Weight: 202 lb (91.6 kg) IBW/kg (Calculated) : 52.4 Heparin Dosing Weight: 74kg  Vital Signs: Temp: 99.1 F (37.3 C) (05/07 0400) Temp Source: Oral (05/07 0400) BP: 91/61 (05/07 0400) Pulse Rate: 46 (05/06 2324)  Labs:  Recent Labs  04/06/17 0805  04/07/17 0318  04/07/17 2055 04/08/17 0200 04/08/17 0647 04/09/17 0152  HGB  --   < > 11.4*  --   --  12.3  --  11.2*  HCT  --   --  36.4  --   --  39.2  --  35.9*  PLT  --   --  175  --   --  193  --  159  HEPARINUNFRC  --   --  <0.10*  < > 0.27*  --  0.40 0.45  CREATININE  --   --  1.25*  --   --  1.40*  --  1.21*  TROPONINI 29.16*  --  25.00*  --   --   --   --   --   < > = values in this interval not displayed.  Estimated Creatinine Clearance: 52.5 mL/min (A) (by C-G formula based on SCr of 1.21 mg/dL (H)).   Assessment: 60yof s/p STEMI found to have 3V CAD plan CABG next week P2Y12 278  Daily heparin level is therapeutic at 0.45 units/mL on 1750 units/hr. Hemoglobin dropped slightly to 11.2 g/dL and platelet count remains within normal limits. No bleeding or infusion issues documented.  Goal of Therapy:  Heparin level 0.3-0.7 units/ml Monitor platelets by anticoagulation protocol: Yes   Plan:  1) Continue heparin at 1750 units/hr 2) Daily heparin level/CBC 3) Monitor for s/sx of bleeding 4) F/U plan for CABG  Carylon Perches, PharmD Acute Care Pharmacy Resident  Pager: [phone] 04/09/2017

## 2017-04-09 NOTE — Progress Notes (Signed)
5 Days Post-Op Procedure(s) (LRB): Left Heart Cath and Coronary Angiography (N/A) Subjective: Recovering from lateral wall MI from circ occlusion not reperfused Creat better to 1.2  Objective: Vital signs in last 24 hours: Temp:  [98.5 F (36.9 C)-99.8 F (37.7 C)] 98.5 F (36.9 C) (05/07 0700) Pulse Rate:  [46-90] 90 (05/07 0800) Cardiac Rhythm: Normal sinus rhythm (05/07 0800) Resp:  [20-37] 22 (05/07 0800) BP: (90-124)/(54-72) 102/66 (05/07 0800) SpO2:  [97 %-99 %] 98 % (05/07 0800) Weight:  [202 lb (91.6 kg)] 202 lb (91.6 kg) (05/07 0400)  Hemodynamic parameters for last 24 hours:  stable  Intake/Output from previous day: 05/06 0701 - 05/07 0700 In: 1500 [P.O.:1080; I.V.:420] Out: 1400 [Urine:1400] Intake/Output this shift: Total I/O In: 395 [P.O.:360; I.V.:35] Out: 500 [Urine:500]       Exam    General- alert and comfortable   Lungs- clear without rales, wheezes   Cor- regular rate and rhythm, no murmur , gallop   Abdomen- soft, non-tender   Extremities - warm, non-tender, minimal edema   Neuro- oriented, appropriate, no focal weakness   Lab Results:  Recent Labs  04/08/17 0200 04/09/17 0152  WBC 13.8* 10.8*  HGB 12.3 11.2*  HCT 39.2 35.9*  PLT 193 159   BMET:  Recent Labs  04/08/17 0200 04/09/17 0152  NA 136 136  K 3.9 4.3  CL 104 107  CO2 24 20*  GLUCOSE 133* 150*  BUN 21* 19  CREATININE 1.40* 1.21*  CALCIUM 8.9 8.7*    PT/INR: No results for input(s): LABPROT, INR in the last 72 hours. ABG No results found for: PHART, HCO3, TCO2, ACIDBASEDEF, O2SAT CBG (last 3)   Recent Labs  04/08/17 1156 04/08/17 1650 04/08/17 2123  GLUCAP 148* 120* 198*    Assessment/Plan: S/P Procedure(s) (LRB): Left Heart Cath and Coronary Angiography (N/A) Cont with cardiac rehab- weak with DOE CABG this week first available OR  Opening - may 10 Finish course of levaquin for poss LLL pneumonia  LOS: 5 days    [name] 04/09/2017

## 2017-04-09 NOTE — Progress Notes (Signed)
Pre-op Cardiac Surgery  Carotid Findings:  Bilateral: No significant (1-39%) ICA stenosis. Antegrade vertebral flow.    Upper Extremity Right Left  Brachial Pressures 115 107  Radial Waveforms Tri Tri  Ulnar Waveforms Tri Tri  Palmar Arch (Allen's Test) decreases >50% with radial compression, normal with ulnar compression decreases >50% with radial compression, normal with ulnar compression   Findings:  Pedal artery waveforms within normal limits.  Farrel Demark, RDMS, RVT 04/09/2017

## 2017-04-10 LAB — BLOOD GAS, ARTERIAL
Acid-base deficit: 5.2 mmol/L — ABNORMAL HIGH (ref 0.0–2.0)
Bicarbonate: 18.2 mmol/L — ABNORMAL LOW (ref 20.0–28.0)
Drawn by: 34762
FIO2: 0.21
O2 Saturation: 98.1 %
Patient temperature: 98.6
pCO2 arterial: 26.9 mmHg — ABNORMAL LOW (ref 32.0–48.0)
pH, Arterial: 7.444 (ref 7.350–7.450)
pO2, Arterial: 109 mmHg — ABNORMAL HIGH (ref 83.0–108.0)

## 2017-04-10 LAB — GLUCOSE, CAPILLARY
Glucose-Capillary: 188 mg/dL — ABNORMAL HIGH (ref 65–99)
Glucose-Capillary: 178 mg/dL — ABNORMAL HIGH (ref 65–99)
Glucose-Capillary: 159 mg/dL — ABNORMAL HIGH (ref 65–99)
Glucose-Capillary: 210 mg/dL — ABNORMAL HIGH (ref 65–99)

## 2017-04-10 LAB — TROPONIN I: Troponin I: 9.68 ng/mL (ref <0.03)

## 2017-04-10 LAB — CBC
HCT: 34.7 % — ABNORMAL LOW (ref 36.0–46.0)
Hemoglobin: 11.1 g/dL — ABNORMAL LOW (ref 12.0–15.0)
MCH: 23.4 pg — ABNORMAL LOW (ref 26.0–34.0)
MCHC: 32 g/dL (ref 30.0–36.0)
MCV: 73.1 fL — ABNORMAL LOW (ref 78.0–100.0)
Platelets: 170 10*3/uL (ref 150–400)
RBC: 4.75 MIL/uL (ref 3.87–5.11)
RDW: 15.7 % — ABNORMAL HIGH (ref 11.5–15.5)
WBC: 10 10*3/uL (ref 4.0–10.5)

## 2017-04-10 LAB — BASIC METABOLIC PANEL
Anion gap: 7 (ref 5–15)
BUN: 18 mg/dL (ref 6–20)
CO2: 19 mmol/L — ABNORMAL LOW (ref 22–32)
Calcium: 8.6 mg/dL — ABNORMAL LOW (ref 8.9–10.3)
Chloride: 108 mmol/L (ref 101–111)
Creatinine, Ser: 1.15 mg/dL — ABNORMAL HIGH (ref 0.44–1.00)
GFR calc Af Amer: 58 mL/min — ABNORMAL LOW (ref >60)
GFR calc non Af Amer: 50 mL/min — ABNORMAL LOW (ref >60)
Glucose, Bld: 182 mg/dL — ABNORMAL HIGH (ref 65–99)
Potassium: 4.5 mmol/L (ref 3.5–5.1)
Sodium: 134 mmol/L — ABNORMAL LOW (ref 135–145)

## 2017-04-10 LAB — HEPARIN LEVEL (UNFRACTIONATED): Heparin Unfractionated: 0.43 IU/mL (ref 0.30–0.70)

## 2017-04-10 NOTE — Progress Notes (Signed)
DAILY PROGRESS NOTE   Patient Name: YATZIRI WAINWRIGHT Date of Encounter: 04/10/2017  Hospital Problem List   Principal Problem:   STEMI involving left circumflex coronary artery [facility]) Active Problems:   Coronary artery disease involving native heart without angina pectoris   Essential hypertension   Diabetes mellitus with nephropathy [facility])    Chief Complaint   No complaints  Subjective   No chest pain or shortness of breath walking around yesterday. Moving bowels. Breathing improved. No abnormal bleeding. Creatinine improved, WBC count improved.  Objective   Vitals:   04/10/17 0434 04/10/17 0500 04/10/17 0600 04/10/17 0700  BP: (!) 95/59 (!) 98/54 100/62 97/61  Pulse:      Resp: (!) 22 (!) 21 (!) 22 (!) 24  Temp: 98.6 F (37 C)     TempSrc: Oral     SpO2: 97%     Weight:      Height:        Intake/Output Summary (Last 24 hours) at 04/10/17 0844 Last data filed at 04/10/17 0700  Gross per 24 hour  Intake           1182.5 ml  Output              900 ml  Net            282.5 ml   Filed Weights   04/08/17 0820 04/09/17 0400 04/10/17 0200  Weight: 204 lb 3.2 oz (92.6 kg) 202 lb (91.6 kg) 204 lb 11.2 oz (92.9 kg)    Physical Exam   General appearance: alert and no distress Lungs: clear to auscultation bilaterally Heart: regular rate and rhythm Extremities: extremities normal, atraumatic, no cyanosis or edema Neurologic: Grossly normal  Inpatient Medications    Scheduled Meds: . aspirin EC  81 mg Oral Daily  . atorvastatin  80 mg Oral q1800  . insulin aspart  0-15 Units Subcutaneous TID WC  . insulin glargine  20 Units Subcutaneous BID  . levofloxacin  750 mg Oral Daily  . potassium chloride  40 mEq Oral BID  . sodium chloride flush  3 mL Intravenous Q12H    Continuous Infusions: . sodium chloride    . sodium chloride Stopped (04/08/17 0700)  . heparin 1,750 Units/hr (04/10/17 0801)    PRN Meds: sodium chloride, acetaminophen, ondansetron (ZOFRAN)  IV, sodium chloride flush   Labs   Results for orders placed or performed during the hospital encounter of 04/04/17 (from the past 48 hour(s))  Glucose, capillary     Status: Abnormal   Collection Time: 04/08/17 11:56 AM  Result Value Ref Range   Glucose-Capillary 148 (H) 65 - 99 mg/dL   Comment 1 Notify RN   Glucose, capillary     Status: Abnormal   Collection Time: 04/08/17  4:50 PM  Result Value Ref Range   Glucose-Capillary 120 (H) 65 - 99 mg/dL   Comment 1 Notify RN   Glucose, capillary     Status: Abnormal   Collection Time: 04/08/17  9:23 PM  Result Value Ref Range   Glucose-Capillary 198 (H) 65 - 99 mg/dL   Comment 1 Capillary Specimen   CBC     Status: Abnormal   Collection Time: 04/09/17  1:52 AM  Result Value Ref Range   WBC 10.8 (H) 4.0 - 10.5 K/uL   RBC 4.90 3.87 - 5.11 MIL/uL   Hemoglobin 11.2 (L) 12.0 - 15.0 g/dL   HCT 35.9 (L) 36.0 - 46.0 %   MCV 73.3 (L)  78.0 - 100.0 fL   MCH 22.9 (L) 26.0 - 34.0 pg   MCHC 31.2 30.0 - 36.0 g/dL   RDW 96.7 (H) 00.0 - 47.6 %   Platelets 159 150 - 400 K/uL  Basic metabolic panel     Status: Abnormal   Collection Time: 04/09/17  1:52 AM  Result Value Ref Range   Sodium 136 135 - 145 mmol/L   Potassium 4.3 3.5 - 5.1 mmol/L   Chloride 107 101 - 111 mmol/L   CO2 20 (L) 22 - 32 mmol/L   Glucose, Bld 150 (H) 65 - 99 mg/dL   BUN 19 6 - 20 mg/dL   Creatinine, Ser 7.37 (H) 0.44 - 1.00 mg/dL   Calcium 8.7 (L) 8.9 - 10.3 mg/dL   GFR calc non Af Amer 47 (L) >60 mL/min   GFR calc Af Amer 55 (L) >60 mL/min    Comment: (NOTE) The eGFR has been calculated using the CKD EPI equation. This calculation has not been validated in all clinical situations. eGFR's persistently <60 mL/min signify possible Chronic Kidney Disease.    Anion gap 9 5 - 15  Heparin level (unfractionated)     Status: None   Collection Time: 04/09/17  1:52 AM  Result Value Ref Range   Heparin Unfractionated 0.45 0.30 - 0.70 IU/mL    Comment:        IF HEPARIN  RESULTS ARE BELOW EXPECTED VALUES, AND PATIENT DOSAGE HAS BEEN CONFIRMED, SUGGEST FOLLOW UP TESTING OF ANTITHROMBIN III LEVELS.   Glucose, capillary     Status: Abnormal   Collection Time: 04/09/17  8:32 AM  Result Value Ref Range   Glucose-Capillary 114 (H) 65 - 99 mg/dL   Comment 1 Notify RN   Glucose, capillary     Status: Abnormal   Collection Time: 04/09/17 12:01 PM  Result Value Ref Range   Glucose-Capillary 186 (H) 65 - 99 mg/dL  Glucose, capillary     Status: Abnormal   Collection Time: 04/09/17  3:29 PM  Result Value Ref Range   Glucose-Capillary 173 (H) 65 - 99 mg/dL   Comment 1 Notify RN   Glucose, capillary     Status: Abnormal   Collection Time: 04/09/17  9:35 PM  Result Value Ref Range   Glucose-Capillary 195 (H) 65 - 99 mg/dL   Comment 1 Capillary Specimen   CBC     Status: Abnormal   Collection Time: 04/10/17  2:17 AM  Result Value Ref Range   WBC 10.0 4.0 - 10.5 K/uL   RBC 4.75 3.87 - 5.11 MIL/uL   Hemoglobin 11.1 (L) 12.0 - 15.0 g/dL   HCT 84.5 (L) 30.6 - 31.6 %   MCV 73.1 (L) 78.0 - 100.0 fL   MCH 23.4 (L) 26.0 - 34.0 pg   MCHC 32.0 30.0 - 36.0 g/dL   RDW 77.7 (H) 31.7 - 91.5 %   Platelets 170 150 - 400 K/uL  Basic metabolic panel     Status: Abnormal   Collection Time: 04/10/17  2:17 AM  Result Value Ref Range   Sodium 134 (L) 135 - 145 mmol/L   Potassium 4.5 3.5 - 5.1 mmol/L   Chloride 108 101 - 111 mmol/L   CO2 19 (L) 22 - 32 mmol/L   Glucose, Bld 182 (H) 65 - 99 mg/dL   BUN 18 6 - 20 mg/dL   Creatinine, Ser 2.48 (H) 0.44 - 1.00 mg/dL   Calcium 8.6 (L) 8.9 - 10.3 mg/dL   GFR  calc non Af Amer 50 (L) >60 mL/min   GFR calc Af Amer 58 (L) >60 mL/min    Comment: (NOTE) The eGFR has been calculated using the CKD EPI equation. This calculation has not been validated in all clinical situations. eGFR's persistently <60 mL/min signify possible Chronic Kidney Disease.    Anion gap 7 5 - 15  Heparin level (unfractionated)     Status: None    Collection Time: 04/10/17  2:17 AM  Result Value Ref Range   Heparin Unfractionated 0.43 0.30 - 0.70 IU/mL    Comment:        IF HEPARIN RESULTS ARE BELOW EXPECTED VALUES, AND PATIENT DOSAGE HAS BEEN CONFIRMED, SUGGEST FOLLOW UP TESTING OF ANTITHROMBIN III LEVELS.   Troponin I     Status: Abnormal   Collection Time: 04/10/17  2:17 AM  Result Value Ref Range   Troponin I 9.68 (HH) <0.03 ng/mL    Comment: CRITICAL RESULT CALLED TO, READ BACK BY AND VERIFIED WITH: [facility] RN 04/10/2017 0315 JORDANS   Blood gas, arterial     Status: Abnormal   Collection Time: 04/10/17  4:53 AM  Result Value Ref Range   FIO2 0.21    pH, Arterial 7.444 7.350 - 7.450   pCO2 arterial 26.9 (L) 32.0 - 48.0 mmHg   pO2, Arterial 109 (H) 83.0 - 108.0 mmHg   Bicarbonate 18.2 (L) 20.0 - 28.0 mmol/L   Acid-base deficit 5.2 (H) 0.0 - 2.0 mmol/L   O2 Saturation 98.1 %   Patient temperature 98.6    Collection site LEFT RADIAL    Drawn by [id number]    Sample type ARTERIAL DRAW    Allens test (pass/fail) PASS PASS    ECG   None today  Telemetry   Sinus rhythm in 80's - Personally Reviewed  Radiology    No results found.  Cardiac Studies   Indications:      Cardiomyopathy - ischemic 414.8.  ------------------------------------------------------------------- History:   Risk factors:  Hypertension. Diabetes mellitus.  ------------------------------------------------------------------- Study Conclusions  - Left ventricle: LVEF is approximately 45% with hypokinesis of the   lateral wall The cavity size was normal. Wall thickness was   normal. Doppler parameters are consistent with abnormal left   ventricular relaxation (grade 1 diastolic dysfunction). - Mitral valve: There was mild regurgitation. - Pulmonary arteries: PA peak pressure: 36 mm Hg (S).  Assessment   Principal Problem:   STEMI involving left circumflex coronary artery [facility]) Active Problems:   Coronary artery disease involving  native heart without angina pectoris   Essential hypertension   Diabetes mellitus with nephropathy ([place])   Plan   1. No complaints. Continue to ambulate. Almost finished with levaquin. Plan for OR on Thursday per Dr. Prescott Gum.  Time Spent Directly with Patient:  15 minutes  Length of Stay:  LOS: 6 days   Pixie Casino, MD, [place]  Attending Cardiologist  Direct Dial: [phone]  Fax: [phone]  Website:  [email or web address] 04/10/2017, 8:44 AM

## 2017-04-10 NOTE — Progress Notes (Signed)
ANTICOAGULATION CONSULT NOTE - Follow Up Consult  Pharmacy Consult for Heparin Indication: 3V CAD plan CABG  No Known Allergies  Patient Measurements: Height: 5\' 3"  (160 cm) Weight: 204 lb 11.2 oz (92.9 kg) IBW/kg (Calculated) : 52.4 Heparin Dosing Weight: 74kg  Vital Signs: Temp: 98.6 F (37 C) (05/08 0434) Temp Source: Oral (05/08 0434) BP: 95/59 (05/08 0434) Pulse Rate: 80 (05/07 2323)  Labs:  Recent Labs  04/08/17 0200 04/08/17 0647 04/09/17 0152 04/10/17 0217  HGB 12.3  --  11.2* 11.1*  HCT 39.2  --  35.9* 34.7*  PLT 193  --  159 170  HEPARINUNFRC  --  0.40 0.45 0.43  CREATININE 1.40*  --  1.21* 1.15*  TROPONINI  --   --   --  9.68*    Estimated Creatinine Clearance: 55.6 mL/min (A) (by C-G formula based on SCr of 1.15 mg/dL (H)).   Assessment: 60yof s/p STEMI found to have 3V CAD plan CABG next week P2Y12 278  Daily heparin level is therapeutic at 0.43 units/mL on 1750 units/hr. Hemoglobin stable at 11.1 g/dL and platelet count remains within normal limits. No bleeding or infusion issues documented.  Goal of Therapy:  Heparin level 0.3-0.7 units/ml Monitor platelets by anticoagulation protocol: Yes   Plan:  1) Continue heparin at 1750 units/hr 2) Daily heparin level/CBC 3) Monitor for s/sx of bleeding 4) CABG when able, likely 5/10 per CVTS note  Carylon Perches, PharmD Acute Care Pharmacy Resident  Pager: [phone] 04/10/2017

## 2017-04-10 NOTE — Progress Notes (Signed)
CARDIAC REHAB PHASE I   PRE:  Rate/Rhythm 105 SR c/ PVCs  BP:  Sitting: 115/68        SaO2: 99 RA  MODE:  Ambulation: 220 ft   POST:  Rate/Rhythm: 105 ST c/ PVCs  BP:  Sitting: 116/75         SaO2: 100 RA  Pt ambulated 220 ft on RA, IV, assist x1, slow, steady gait, tolerated fairly well. Pt c/o weakness in her legs, fatigue with distance, standing rest x1. Pt to recliner after walk, call bell within reach. Will follow.   [id number] Joylene Grapes, RN, BSN 04/10/2017 2:14 PM

## 2017-04-10 NOTE — Care Management Note (Signed)
Case Management Note Donn Pierini RN, BSN Unit 2W-Case Manager--2H coverage [phone]  Patient Details  Name: Rachael Davis MRN: [id number] Date of Birth: [birth year]  Subjective/Objective:  Pt admitted with STEMI, plan for CABG on 5/10                  Action/Plan: PTA pt lived at home with spouse- CM to follow post op for d/c needs.   Expected Discharge Date:                  Expected Discharge Plan:  Home/Self Care  In-House Referral:     Discharge planning Services  CM Consult  Post Acute Care Choice:    Choice offered to:     DME Arranged:    DME Agency:     HH Arranged:    HH Agency:     Status of Service:  In process, will continue to follow  If discussed at Long Length of Stay Meetings, dates discussed:    Discharge Disposition:   Additional Comments:  Darrold Span, RN 04/10/2017, 3:29 PM

## 2017-04-11 ENCOUNTER — Inpatient Hospital Stay (HOSPITAL_COMMUNITY): Payer: Managed Care, Other (non HMO)

## 2017-04-11 LAB — CBC
HCT: 34.2 % — ABNORMAL LOW (ref 36.0–46.0)
Hemoglobin: 10.9 g/dL — ABNORMAL LOW (ref 12.0–15.0)
MCH: 23.3 pg — ABNORMAL LOW (ref 26.0–34.0)
MCHC: 31.9 g/dL (ref 30.0–36.0)
MCV: 73.1 fL — ABNORMAL LOW (ref 78.0–100.0)
Platelets: 186 10*3/uL (ref 150–400)
RBC: 4.68 MIL/uL (ref 3.87–5.11)
RDW: 15.8 % — ABNORMAL HIGH (ref 11.5–15.5)
WBC: 9.8 10*3/uL (ref 4.0–10.5)

## 2017-04-11 LAB — COMPREHENSIVE METABOLIC PANEL
ALT: 61 U/L — ABNORMAL HIGH (ref 14–54)
AST: 46 U/L — ABNORMAL HIGH (ref 15–41)
Albumin: 2.6 g/dL — ABNORMAL LOW (ref 3.5–5.0)
Alkaline Phosphatase: 86 U/L (ref 38–126)
Anion gap: 8 (ref 5–15)
BUN: 17 mg/dL (ref 6–20)
CO2: 18 mmol/L — ABNORMAL LOW (ref 22–32)
Calcium: 8.4 mg/dL — ABNORMAL LOW (ref 8.9–10.3)
Chloride: 109 mmol/L (ref 101–111)
Creatinine, Ser: 1.18 mg/dL — ABNORMAL HIGH (ref 0.44–1.00)
GFR calc Af Amer: 56 mL/min — ABNORMAL LOW (ref >60)
GFR calc non Af Amer: 49 mL/min — ABNORMAL LOW (ref >60)
Glucose, Bld: 178 mg/dL — ABNORMAL HIGH (ref 65–99)
Potassium: 4.6 mmol/L (ref 3.5–5.1)
Sodium: 135 mmol/L (ref 135–145)
Total Bilirubin: 0.4 mg/dL (ref 0.3–1.2)
Total Protein: 6.3 g/dL — ABNORMAL LOW (ref 6.5–8.1)

## 2017-04-11 LAB — MRSA CULTURE: Culture: NOT DETECTED

## 2017-04-11 LAB — GLUCOSE, CAPILLARY
Glucose-Capillary: 130 mg/dL — ABNORMAL HIGH (ref 65–99)
Glucose-Capillary: 128 mg/dL — ABNORMAL HIGH (ref 65–99)
Glucose-Capillary: 132 mg/dL — ABNORMAL HIGH (ref 65–99)

## 2017-04-11 LAB — HEPARIN LEVEL (UNFRACTIONATED): Heparin Unfractionated: 0.43 IU/mL (ref 0.30–0.70)

## 2017-04-11 LAB — PREPARE RBC (CROSSMATCH)

## 2017-04-11 LAB — ABO/RH: ABO/RH(D): A POS

## 2017-04-11 MED ORDER — CEFUROXIME SODIUM 1.5 G IJ SOLR
1.5000 g | INTRAMUSCULAR | Status: AC
  Administered 2017-04-12: 1.5 g via INTRAVENOUS
  Administered 2017-04-12: .75 g via INTRAVENOUS
  Filled 2017-04-11 (×2): qty 1.5

## 2017-04-11 MED ORDER — TRANEXAMIC ACID (OHS) BOLUS VIA INFUSION
15.0000 mg/kg | INTRAVENOUS | Status: AC
  Administered 2017-04-12: 1393.5 mg via INTRAVENOUS
  Filled 2017-04-11 (×2): qty 1394

## 2017-04-11 MED ORDER — MAGNESIUM SULFATE 50 % IJ SOLN
40.0000 meq | INTRAMUSCULAR | Status: DC
  Filled 2017-04-11 (×2): qty 10

## 2017-04-11 MED ORDER — DEXMEDETOMIDINE HCL IN NACL 400 MCG/100ML IV SOLN
0.1000 ug/kg/h | INTRAVENOUS | Status: AC
  Administered 2017-04-12: .5 ug/kg/h via INTRAVENOUS
  Filled 2017-04-11 (×2): qty 100

## 2017-04-11 MED ORDER — CHLORHEXIDINE GLUCONATE 4 % EX LIQD
60.0000 mL | Freq: Once | CUTANEOUS | Status: AC
  Administered 2017-04-12: 4 via TOPICAL
  Filled 2017-04-11: qty 60

## 2017-04-11 MED ORDER — METOPROLOL TARTRATE 12.5 MG HALF TABLET
12.5000 mg | ORAL_TABLET | Freq: Once | ORAL | Status: AC
  Administered 2017-04-12: 12.5 mg via ORAL
  Filled 2017-04-11: qty 1

## 2017-04-11 MED ORDER — ALPRAZOLAM 0.25 MG PO TABS: 0.2500 mg | ORAL_TABLET | ORAL | Status: DC | PRN

## 2017-04-11 MED ORDER — CHLORHEXIDINE GLUCONATE 0.12 % MT SOLN
15.0000 mL | Freq: Once | OROMUCOSAL | Status: AC
  Administered 2017-04-12: 15 mL via OROMUCOSAL
  Filled 2017-04-11: qty 15

## 2017-04-11 MED ORDER — DIAZEPAM 2 MG PO TABS
2.0000 mg | ORAL_TABLET | Freq: Once | ORAL | Status: AC
  Administered 2017-04-12: 2 mg via ORAL
  Filled 2017-04-11: qty 1

## 2017-04-11 MED ORDER — TRANEXAMIC ACID (OHS) PUMP PRIME SOLUTION
2.0000 mg/kg | INTRAVENOUS | Status: DC
  Filled 2017-04-11 (×2): qty 1.86

## 2017-04-11 MED ORDER — SODIUM CHLORIDE 0.9 % IV SOLN
30.0000 ug/min | INTRAVENOUS | Status: DC
  Filled 2017-04-11 (×2): qty 2

## 2017-04-11 MED ORDER — TEMAZEPAM 15 MG PO CAPS: 15.0000 mg | ORAL_CAPSULE | Freq: Once | ORAL | Status: DC | PRN

## 2017-04-11 MED ORDER — EPINEPHRINE PF 1 MG/ML IJ SOLN
0.0000 ug/min | INTRAVENOUS | Status: DC
  Filled 2017-04-11 (×2): qty 4

## 2017-04-11 MED ORDER — NITROGLYCERIN IN D5W 200-5 MCG/ML-% IV SOLN
2.0000 ug/min | INTRAVENOUS | Status: AC
  Administered 2017-04-12: 10 ug/min via INTRAVENOUS
  Filled 2017-04-11 (×2): qty 250

## 2017-04-11 MED ORDER — DOPAMINE-DEXTROSE 3.2-5 MG/ML-% IV SOLN
0.0000 ug/kg/min | INTRAVENOUS | Status: AC
  Administered 2017-04-12: 3 ug/kg/min via INTRAVENOUS
  Filled 2017-04-11 (×2): qty 250

## 2017-04-11 MED ORDER — DEXTROSE 5 % IV SOLN
750.0000 mg | INTRAVENOUS | Status: DC
  Filled 2017-04-11 (×2): qty 750

## 2017-04-11 MED ORDER — POTASSIUM CHLORIDE 2 MEQ/ML IV SOLN
80.0000 meq | INTRAVENOUS | Status: DC
  Filled 2017-04-11 (×2): qty 40

## 2017-04-11 MED ORDER — SODIUM CHLORIDE 0.9 % IV SOLN
INTRAVENOUS | Status: AC
  Administered 2017-04-12: .7 [IU]/h via INTRAVENOUS
  Filled 2017-04-11 (×2): qty 1

## 2017-04-11 MED ORDER — PLASMA-LYTE 148 IV SOLN
INTRAVENOUS | Status: AC
  Administered 2017-04-12: 500 mL
  Filled 2017-04-11 (×2): qty 2.5

## 2017-04-11 MED ORDER — TRANEXAMIC ACID 1000 MG/10ML IV SOLN
1.5000 mg/kg/h | INTRAVENOUS | Status: AC
  Administered 2017-04-12: 1.5 mg/kg/h via INTRAVENOUS
  Administered 2017-04-12: 15:00:00 via INTRAVENOUS
  Filled 2017-04-11 (×2): qty 25

## 2017-04-11 MED ORDER — SODIUM CHLORIDE 0.9 % IV SOLN
INTRAVENOUS | Status: DC
  Filled 2017-04-11 (×2): qty 30

## 2017-04-11 MED ORDER — BISACODYL 5 MG PO TBEC: 5.0000 mg | DELAYED_RELEASE_TABLET | Freq: Once | ORAL | Status: DC

## 2017-04-11 MED ORDER — SODIUM CHLORIDE 0.9 % IV SOLN
1500.0000 mg | INTRAVENOUS | Status: AC
  Administered 2017-04-12: 1500 mg via INTRAVENOUS
  Filled 2017-04-11 (×2): qty 1500

## 2017-04-11 MED ORDER — GUAIFENESIN-DM 100-10 MG/5ML PO SYRP: 5.0000 mL | ORAL_SOLUTION | ORAL | Status: DC | PRN

## 2017-04-11 MED ORDER — CHLORHEXIDINE GLUCONATE 4 % EX LIQD
60.0000 mL | Freq: Once | CUTANEOUS | Status: AC
  Administered 2017-04-11: 4 via TOPICAL
  Filled 2017-04-11: qty 60

## 2017-04-11 NOTE — Progress Notes (Signed)
7 Days Post-Op Procedure(s) (LRB): Left Heart Cath and Coronary Angiography (N/A) Subjective: Stable on iv heparin Creat stable plan CABGx 4 in am  Objective: Vital signs in last 24 hours: Temp:  [98.3 F (36.8 C)-99.5 F (37.5 C)] 98.8 F (37.1 C) (05/09 0806) Pulse Rate:  [90] 90 (05/08 2000) Cardiac Rhythm: Normal sinus rhythm (05/08 2000) Resp:  [16-39] 25 (05/09 0800) BP: (92-116)/(58-75) 96/62 (05/09 0800) SpO2:  [97 %-99 %] 98 % (05/09 0800)  Hemodynamic parameters for last 24 hours:  nsr  Intake/Output from previous day: 05/08 0701 - 05/09 0700 In: 1140 [P.O.:720; I.V.:420] Out: 1300 [Urine:1300] Intake/Output this shift: No intake/output data recorded.       Exam    General- alert and comfortable   Lungs- clear without rales, wheezes   Cor- regular rate and rhythm, no murmur , gallop   Abdomen- soft, non-tender   Extremities - warm, non-tender, minimal edema   Neuro- oriented, appropriate, no focal weakness   Lab Results:  Recent Labs  04/10/17 0217 04/11/17 0215  WBC 10.0 9.8  HGB 11.1* 10.9*  HCT 34.7* 34.2*  PLT 170 186   BMET:  Recent Labs  04/10/17 0217 04/11/17 0215  NA 134* 135  K 4.5 4.6  CL 108 109  CO2 19* 18*  GLUCOSE 182* 178*  BUN 18 17  CREATININE 1.15* 1.18*  CALCIUM 8.6* 8.4*    PT/INR: No results for input(s): LABPROT, INR in the last 72 hours. ABG    Component Value Date/Time   PHART 7.444 04/10/2017 0453   HCO3 18.2 (L) 04/10/2017 0453   ACIDBASEDEF 5.2 (H) 04/10/2017 0453   O2SAT 98.1 04/10/2017 0453   CBG (last 3)   Recent Labs  04/10/17 1658 04/10/17 2121 04/11/17 0802  GLUCAP 159* 210* 130*    Assessment/Plan: S/P Procedure(s) (LRB): Left Heart Cath and Coronary Angiography (N/A) CABG in am at increased risk due to completed MI from total circumflex occlusion not opened with PCI- moderate LV dysfunction Patient understands benefits, risks and agrees to proceed  LOS: 7 days    Kathlee Nations Trigt  III 04/11/2017

## 2017-04-11 NOTE — Progress Notes (Signed)
ANTICOAGULATION CONSULT NOTE - Follow Up Consult  Pharmacy Consult for Heparin Indication: 3V CAD plan CABG  No Known Allergies  Patient Measurements: Height: 5\' 3"  (160 cm) Weight: 204 lb 11.2 oz (92.9 kg) IBW/kg (Calculated) : 52.4 Heparin Dosing Weight: 74kg  Vital Signs: Temp: 98.9 F (37.2 C) (05/09 0400) Temp Source: Oral (05/09 0400) BP: 104/60 (05/09 0200) Pulse Rate: 90 (05/08 2000)  Labs:  Recent Labs  04/09/17 0152 04/10/17 0217 04/11/17 0215  HGB 11.2* 11.1* 10.9*  HCT 35.9* 34.7* 34.2*  PLT 159 170 186  HEPARINUNFRC 0.45 0.43 0.43  CREATININE 1.21* 1.15* 1.18*  TROPONINI  --  9.68*  --     Estimated Creatinine Clearance: 54.2 mL/min (A) (by C-G formula based on SCr of 1.18 mg/dL (H)).   Assessment: 60yof s/p STEMI found to have 3V CAD plan CABG next week P2Y12 278  Daily heparin level is therapeutic at 0.43 units/mL on 1750 units/hr. Hemoglobin stable at 10.9 g/dL and platelet count remains within normal limits. No bleeding or infusion issues documented.  Goal of Therapy:  Heparin level 0.3-0.7 units/ml Monitor platelets by anticoagulation protocol: Yes   Plan:  1) Continue heparin at 1750 units/hr 2) Daily heparin level/CBC 3) Monitor for s/sx of bleeding 4) CABG likely 5/10   Carylon Perches, PharmD Acute Care Pharmacy Resident  Pager: [phone] 04/11/2017

## 2017-04-11 NOTE — Progress Notes (Signed)
DAILY PROGRESS NOTE   Patient Name: Rachael Davis Date of Encounter: 04/11/2017  Hospital Problem List   Principal Problem:   STEMI involving left circumflex coronary artery [facility]) Active Problems:   Coronary artery disease involving native heart without angina pectoris   Essential hypertension   Diabetes mellitus with nephropathy [facility])    Chief Complaint   Anxious about surgery  Subjective   Continues to do well without chest pain. Ambulating. PVC's noted on telemetry. Plan for CABG tomorrow.  Objective   Vitals:   04/11/17 0500 04/11/17 0600 04/11/17 0700 04/11/17 0806  BP:  (!) 92/58    Pulse:      Resp: 20 (!) 27 (!) 30   Temp:    98.8 F (37.1 C)  TempSrc:    Oral  SpO2:      Weight:      Height:        Intake/Output Summary (Last 24 hours) at 04/11/17 0806 Last data filed at 04/11/17 0700  Gross per 24 hour  Intake           1122.5 ml  Output             1300 ml  Net           -177.5 ml   Filed Weights   04/08/17 0820 04/09/17 0400 04/10/17 0200  Weight: 204 lb 3.2 oz (92.6 kg) 202 lb (91.6 kg) 204 lb 11.2 oz (92.9 kg)    Physical Exam   General appearance: alert, appears stated age and no distress Lungs: clear to auscultation bilaterally Heart: regular rate and rhythm, S1, S2 normal, no murmur, click, rub or gallop Pulses: 2+ and symmetric Neurologic: Mental status: Alert, oriented, thought content appropriate  Inpatient Medications    Scheduled Meds: . aspirin EC  81 mg Oral Daily  . atorvastatin  80 mg Oral q1800  . insulin aspart  0-15 Units Subcutaneous TID WC  . insulin glargine  20 Units Subcutaneous BID  . levofloxacin  750 mg Oral Daily  . potassium chloride  40 mEq Oral BID  . sodium chloride flush  3 mL Intravenous Q12H    Continuous Infusions: . sodium chloride    . sodium chloride Stopped (04/08/17 0700)  . heparin 1,750 Units/hr (04/10/17 0801)    PRN Meds: sodium chloride, acetaminophen, ondansetron (ZOFRAN) IV,  sodium chloride flush   Labs   Results for orders placed or performed during the hospital encounter of 04/04/17 (from the past 48 hour(s))  Glucose, capillary     Status: Abnormal   Collection Time: 04/09/17  8:32 AM  Result Value Ref Range   Glucose-Capillary 114 (H) 65 - 99 mg/dL   Comment 1 Notify RN   Glucose, capillary     Status: Abnormal   Collection Time: 04/09/17 12:01 PM  Result Value Ref Range   Glucose-Capillary 186 (H) 65 - 99 mg/dL  Glucose, capillary     Status: Abnormal   Collection Time: 04/09/17  3:29 PM  Result Value Ref Range   Glucose-Capillary 173 (H) 65 - 99 mg/dL   Comment 1 Notify RN   Glucose, capillary     Status: Abnormal   Collection Time: 04/09/17  9:35 PM  Result Value Ref Range   Glucose-Capillary 195 (H) 65 - 99 mg/dL   Comment 1 Capillary Specimen   MRSA culture     Status: None (Preliminary result)   Collection Time: 04/09/17 10:35 PM  Result Value Ref Range   Specimen Description  NASOPHARYNGEAL    Special Requests NONE    Culture CULTURE REINCUBATED FOR BETTER GROWTH    Report Status PENDING   CBC     Status: Abnormal   Collection Time: 04/10/17  2:17 AM  Result Value Ref Range   WBC 10.0 4.0 - 10.5 K/uL   RBC 4.75 3.87 - 5.11 MIL/uL   Hemoglobin 11.1 (L) 12.0 - 15.0 g/dL   HCT 34.7 (L) 36.0 - 46.0 %   MCV 73.1 (L) 78.0 - 100.0 fL   MCH 23.4 (L) 26.0 - 34.0 pg   MCHC 32.0 30.0 - 36.0 g/dL   RDW 15.7 (H) 11.5 - 15.5 %   Platelets 170 150 - 400 K/uL  Basic metabolic panel     Status: Abnormal   Collection Time: 04/10/17  2:17 AM  Result Value Ref Range   Sodium 134 (L) 135 - 145 mmol/L   Potassium 4.5 3.5 - 5.1 mmol/L   Chloride 108 101 - 111 mmol/L   CO2 19 (L) 22 - 32 mmol/L   Glucose, Bld 182 (H) 65 - 99 mg/dL   BUN 18 6 - 20 mg/dL   Creatinine, Ser 1.15 (H) 0.44 - 1.00 mg/dL   Calcium 8.6 (L) 8.9 - 10.3 mg/dL   GFR calc non Af Amer 50 (L) >60 mL/min   GFR calc Af Amer 58 (L) >60 mL/min    Comment: (NOTE) The eGFR has been  calculated using the CKD EPI equation. This calculation has not been validated in all clinical situations. eGFR's persistently <60 mL/min signify possible Chronic Kidney Disease.    Anion gap 7 5 - 15  Heparin level (unfractionated)     Status: None   Collection Time: 04/10/17  2:17 AM  Result Value Ref Range   Heparin Unfractionated 0.43 0.30 - 0.70 IU/mL    Comment:        IF HEPARIN RESULTS ARE BELOW EXPECTED VALUES, AND PATIENT DOSAGE HAS BEEN CONFIRMED, SUGGEST FOLLOW UP TESTING OF ANTITHROMBIN III LEVELS.   Troponin I     Status: Abnormal   Collection Time: 04/10/17  2:17 AM  Result Value Ref Range   Troponin I 9.68 (HH) <0.03 ng/mL    Comment: CRITICAL RESULT CALLED TO, READ BACK BY AND VERIFIED WITH: [facility] RN 04/10/2017 0315 JORDANS   Blood gas, arterial     Status: Abnormal   Collection Time: 04/10/17  4:53 AM  Result Value Ref Range   FIO2 0.21    pH, Arterial 7.444 7.350 - 7.450   pCO2 arterial 26.9 (L) 32.0 - 48.0 mmHg   pO2, Arterial 109 (H) 83.0 - 108.0 mmHg   Bicarbonate 18.2 (L) 20.0 - 28.0 mmol/L   Acid-base deficit 5.2 (H) 0.0 - 2.0 mmol/L   O2 Saturation 98.1 %   Patient temperature 98.6    Collection site LEFT RADIAL    Drawn by [phone]    Sample type ARTERIAL DRAW    Allens test (pass/fail) PASS PASS  Glucose, capillary     Status: Abnormal   Collection Time: 04/10/17  9:20 AM  Result Value Ref Range   Glucose-Capillary 188 (H) 65 - 99 mg/dL   Comment 1 Capillary Specimen   Glucose, capillary     Status: Abnormal   Collection Time: 04/10/17 12:09 PM  Result Value Ref Range   Glucose-Capillary 178 (H) 65 - 99 mg/dL   Comment 1 Capillary Specimen   Glucose, capillary     Status: Abnormal   Collection Time: 04/10/17  4:58  PM  Result Value Ref Range   Glucose-Capillary 159 (H) 65 - 99 mg/dL   Comment 1 Capillary Specimen    Comment 2 Notify RN   Glucose, capillary     Status: Abnormal   Collection Time: 04/10/17  9:21 PM  Result Value Ref  Range   Glucose-Capillary 210 (H) 65 - 99 mg/dL   Comment 1 Capillary Specimen   CBC     Status: Abnormal   Collection Time: 04/11/17  2:15 AM  Result Value Ref Range   WBC 9.8 4.0 - 10.5 K/uL   RBC 4.68 3.87 - 5.11 MIL/uL   Hemoglobin 10.9 (L) 12.0 - 15.0 g/dL   HCT 34.2 (L) 36.0 - 46.0 %   MCV 73.1 (L) 78.0 - 100.0 fL   MCH 23.3 (L) 26.0 - 34.0 pg   MCHC 31.9 30.0 - 36.0 g/dL   RDW 15.8 (H) 11.5 - 15.5 %   Platelets 186 150 - 400 K/uL  Heparin level (unfractionated)     Status: None   Collection Time: 04/11/17  2:15 AM  Result Value Ref Range   Heparin Unfractionated 0.43 0.30 - 0.70 IU/mL    Comment:        IF HEPARIN RESULTS ARE BELOW EXPECTED VALUES, AND PATIENT DOSAGE HAS BEEN CONFIRMED, SUGGEST FOLLOW UP TESTING OF ANTITHROMBIN III LEVELS.   Comprehensive metabolic panel     Status: Abnormal   Collection Time: 04/11/17  2:15 AM  Result Value Ref Range   Sodium 135 135 - 145 mmol/L   Potassium 4.6 3.5 - 5.1 mmol/L   Chloride 109 101 - 111 mmol/L   CO2 18 (L) 22 - 32 mmol/L   Glucose, Bld 178 (H) 65 - 99 mg/dL   BUN 17 6 - 20 mg/dL   Creatinine, Ser 1.18 (H) 0.44 - 1.00 mg/dL   Calcium 8.4 (L) 8.9 - 10.3 mg/dL   Total Protein 6.3 (L) 6.5 - 8.1 g/dL   Albumin 2.6 (L) 3.5 - 5.0 g/dL   AST 46 (H) 15 - 41 U/L   ALT 61 (H) 14 - 54 U/L   Alkaline Phosphatase 86 38 - 126 U/L   Total Bilirubin 0.4 0.3 - 1.2 mg/dL   GFR calc non Af Amer 49 (L) >60 mL/min   GFR calc Af Amer 56 (L) >60 mL/min    Comment: (NOTE) The eGFR has been calculated using the CKD EPI equation. This calculation has not been validated in all clinical situations. eGFR's persistently <60 mL/min signify possible Chronic Kidney Disease.    Anion gap 8 5 - 15    ECG   None performed today  Telemetry   Sinus rhythm in 60's- occasional PVC's - Personally Reviewed  Radiology    Dg Chest Port 1 View  Result Date: 04/11/2017 CLINICAL DATA:  Onset of chest pain today. Cardiac catheterization 7  days ago. History of diabetes and hypertension, STEMI. EXAM: PORTABLE CHEST 1 VIEW COMPARISON:  Chest x-ray of Apr 07, 2017 FINDINGS: The lungs are better inflated today. The interstitial markings remain mildly increased. The cardiac silhouette is enlarged and the pulmonary vascularity engorged and less distinct today. A small left pleural effusion is suspected. The thoracic spine exhibits multilevel degenerative disc disease. IMPRESSION: Mild CHF which has appeared since the earlier study. No alveolar pneumonia. Electronically Signed   By: David  Martinique M.D.   On: 04/11/2017 07:55    Cardiac Studies   Indications:      Cardiomyopathy - ischemic 414.8.  -------------------------------------------------------------------  History:   Risk factors:  Hypertension. Diabetes mellitus.  ------------------------------------------------------------------- Study Conclusions  - Left ventricle: LVEF is approximately 45% with hypokinesis of the   lateral wall The cavity size was normal. Wall thickness was   normal. Doppler parameters are consistent with abnormal left   ventricular relaxation (grade 1 diastolic dysfunction). - Mitral valve: There was mild regurgitation. - Pulmonary arteries: PA peak pressure: 36 mm Hg (S).  Assessment   Principal Problem:   STEMI involving left circumflex coronary artery [facility]) Active Problems:   Coronary artery disease involving native heart without angina pectoris   Essential hypertension   Diabetes mellitus with nephropathy ([place])   Plan   1. Anxious to have surgery - planned tomorrow. Labs stable - creatinine has plateaued around 1.15. Leukocytosis has resolved. Course of levaquin for PNA has completed. Ready for surgery.  Time Spent Directly with Patient:  15 minutes  Length of Stay:  LOS: 7 days   Pixie Casino, MD, [place]  Attending Cardiologist  Direct Dial: [phone]  Fax: [phone]  Website:   [email or web address] 04/11/2017, 8:06 AM

## 2017-04-11 NOTE — Progress Notes (Signed)
Pre-surgery education: patient previously provided with cardiac surgery booklet and has watched pre-op videos with family.  Patient advised she has had time to review the cardiac surgery booklet and has asked questions as applicable.  Patient has no questions at this time.

## 2017-04-11 NOTE — Progress Notes (Signed)
.  CARDIAC REHAB PHASE I   PRE:  Rate/Rhythm: 92 SR  BP:  Supine:   Sitting: 102/69  Standing:    SaO2: 98%RA  MODE:  Ambulation: 210 ft   POST:  Rate/Rhythm: 110 ST  BP:  Supine:   Sitting: 102/53  Standing:    SaO2: 99%RA 1132-1150 Pt walked 210 ft on RA with minimal asst . Tolerated well. No CP. Will follow up after surgery. To recliner with call bell.   Luetta Nutting, RN BSN  04/11/2017 11:45 AM

## 2017-04-12 ENCOUNTER — Inpatient Hospital Stay (HOSPITAL_COMMUNITY): Payer: Managed Care, Other (non HMO)

## 2017-04-12 ENCOUNTER — Inpatient Hospital Stay (HOSPITAL_COMMUNITY): Payer: Managed Care, Other (non HMO) | Admitting: Certified Registered Nurse Anesthetist

## 2017-04-12 ENCOUNTER — Encounter (HOSPITAL_COMMUNITY)
Admission: EM | Disposition: A | Discharge status: Home Health | Payer: Self-pay | Source: Home / Self Care | Attending: Cardiothoracic Surgery

## 2017-04-12 ENCOUNTER — Encounter (HOSPITAL_COMMUNITY): Payer: Self-pay

## 2017-04-12 DIAGNOSIS — Z951 Presence of aortocoronary bypass graft: Secondary | ICD-10-CM

## 2017-04-12 DIAGNOSIS — I340 Nonrheumatic mitral (valve) insufficiency: Secondary | ICD-10-CM

## 2017-04-12 DIAGNOSIS — I25110 Atherosclerotic heart disease of native coronary artery with unstable angina pectoris: Secondary | ICD-10-CM

## 2017-04-12 HISTORY — PX: TEE WITHOUT CARDIOVERSION: SHX5443

## 2017-04-12 HISTORY — PX: CORONARY ARTERY BYPASS GRAFT: SHX141

## 2017-04-12 HISTORY — PX: MITRAL VALVE REPAIR: SHX2039

## 2017-04-12 LAB — POCT I-STAT, CHEM 8
BUN: 12 mg/dL (ref 6–20)
BUN: 11 mg/dL (ref 6–20)
BUN: 9 mg/dL (ref 6–20)
BUN: 11 mg/dL (ref 6–20)
BUN: 10 mg/dL (ref 6–20)
BUN: 10 mg/dL (ref 6–20)
BUN: 10 mg/dL (ref 6–20)
BUN: 11 mg/dL (ref 6–20)
Calcium, Ion: 1.28 mmol/L (ref 1.15–1.40)
Calcium, Ion: 1.29 mmol/L (ref 1.15–1.40)
Calcium, Ion: 1.03 mmol/L — ABNORMAL LOW (ref 1.15–1.40)
Calcium, Ion: 1.14 mmol/L — ABNORMAL LOW (ref 1.15–1.40)
Calcium, Ion: 1.15 mmol/L (ref 1.15–1.40)
Calcium, Ion: 1.13 mmol/L — ABNORMAL LOW (ref 1.15–1.40)
Calcium, Ion: 1.11 mmol/L — ABNORMAL LOW (ref 1.15–1.40)
Calcium, Ion: 1.19 mmol/L (ref 1.15–1.40)
Chloride: 110 mmol/L (ref 101–111)
Chloride: 107 mmol/L (ref 101–111)
Chloride: 106 mmol/L (ref 101–111)
Chloride: 104 mmol/L (ref 101–111)
Chloride: 106 mmol/L (ref 101–111)
Chloride: 108 mmol/L (ref 101–111)
Chloride: 106 mmol/L (ref 101–111)
Chloride: 110 mmol/L (ref 101–111)
Creatinine, Ser: 0.9 mg/dL (ref 0.44–1.00)
Creatinine, Ser: 0.7 mg/dL (ref 0.44–1.00)
Creatinine, Ser: 0.5 mg/dL (ref 0.44–1.00)
Creatinine, Ser: 0.6 mg/dL (ref 0.44–1.00)
Creatinine, Ser: 0.7 mg/dL (ref 0.44–1.00)
Creatinine, Ser: 0.6 mg/dL (ref 0.44–1.00)
Creatinine, Ser: 0.8 mg/dL (ref 0.44–1.00)
Creatinine, Ser: 0.9 mg/dL (ref 0.44–1.00)
Glucose, Bld: 95 mg/dL (ref 65–99)
Glucose, Bld: 136 mg/dL — ABNORMAL HIGH (ref 65–99)
Glucose, Bld: 122 mg/dL — ABNORMAL HIGH (ref 65–99)
Glucose, Bld: 178 mg/dL — ABNORMAL HIGH (ref 65–99)
Glucose, Bld: 186 mg/dL — ABNORMAL HIGH (ref 65–99)
Glucose, Bld: 193 mg/dL — ABNORMAL HIGH (ref 65–99)
Glucose, Bld: 215 mg/dL — ABNORMAL HIGH (ref 65–99)
Glucose, Bld: 98 mg/dL (ref 65–99)
HCT: 30 % — ABNORMAL LOW (ref 36.0–46.0)
HCT: 30 % — ABNORMAL LOW (ref 36.0–46.0)
HCT: 23 % — ABNORMAL LOW (ref 36.0–46.0)
HCT: 25 % — ABNORMAL LOW (ref 36.0–46.0)
HCT: 25 % — ABNORMAL LOW (ref 36.0–46.0)
HCT: 25 % — ABNORMAL LOW (ref 36.0–46.0)
HCT: 28 % — ABNORMAL LOW (ref 36.0–46.0)
HCT: 28 % — ABNORMAL LOW (ref 36.0–46.0)
Hemoglobin: 10.2 g/dL — ABNORMAL LOW (ref 12.0–15.0)
Hemoglobin: 10.2 g/dL — ABNORMAL LOW (ref 12.0–15.0)
Hemoglobin: 7.8 g/dL — ABNORMAL LOW (ref 12.0–15.0)
Hemoglobin: 8.5 g/dL — ABNORMAL LOW (ref 12.0–15.0)
Hemoglobin: 8.5 g/dL — ABNORMAL LOW (ref 12.0–15.0)
Hemoglobin: 8.5 g/dL — ABNORMAL LOW (ref 12.0–15.0)
Hemoglobin: 9.5 g/dL — ABNORMAL LOW (ref 12.0–15.0)
Hemoglobin: 9.5 g/dL — ABNORMAL LOW (ref 12.0–15.0)
Potassium: 4.7 mmol/L (ref 3.5–5.1)
Potassium: 4.7 mmol/L (ref 3.5–5.1)
Potassium: 4.9 mmol/L (ref 3.5–5.1)
Potassium: 4.8 mmol/L (ref 3.5–5.1)
Potassium: 5.1 mmol/L (ref 3.5–5.1)
Potassium: 5.5 mmol/L — ABNORMAL HIGH (ref 3.5–5.1)
Potassium: 5.4 mmol/L — ABNORMAL HIGH (ref 3.5–5.1)
Potassium: 4.5 mmol/L (ref 3.5–5.1)
Sodium: 137 mmol/L (ref 135–145)
Sodium: 138 mmol/L (ref 135–145)
Sodium: 137 mmol/L (ref 135–145)
Sodium: 136 mmol/L (ref 135–145)
Sodium: 137 mmol/L (ref 135–145)
Sodium: 137 mmol/L (ref 135–145)
Sodium: 138 mmol/L (ref 135–145)
Sodium: 137 mmol/L (ref 135–145)
TCO2: 23 mmol/L (ref 0–100)
TCO2: 23 mmol/L (ref 0–100)
TCO2: 21 mmol/L (ref 0–100)
TCO2: 26 mmol/L (ref 0–100)
TCO2: 26 mmol/L (ref 0–100)
TCO2: 24 mmol/L (ref 0–100)
TCO2: 21 mmol/L (ref 0–100)
TCO2: 22 mmol/L (ref 0–100)

## 2017-04-12 LAB — POCT I-STAT 3, ART BLOOD GAS (G3+)
Acid-base deficit: 4 mmol/L — ABNORMAL HIGH (ref 0.0–2.0)
Acid-base deficit: 5 mmol/L — ABNORMAL HIGH (ref 0.0–2.0)
Acid-base deficit: 5 mmol/L — ABNORMAL HIGH (ref 0.0–2.0)
Acid-base deficit: 6 mmol/L — ABNORMAL HIGH (ref 0.0–2.0)
Acid-base deficit: 6 mmol/L — ABNORMAL HIGH (ref 0.0–2.0)
Acid-base deficit: 7 mmol/L — ABNORMAL HIGH (ref 0.0–2.0)
Bicarbonate: 17.8 mmol/L — ABNORMAL LOW (ref 20.0–28.0)
Bicarbonate: 19 mmol/L — ABNORMAL LOW (ref 20.0–28.0)
Bicarbonate: 19.3 mmol/L — ABNORMAL LOW (ref 20.0–28.0)
Bicarbonate: 20.2 mmol/L (ref 20.0–28.0)
Bicarbonate: 20.2 mmol/L (ref 20.0–28.0)
Bicarbonate: 20.6 mmol/L (ref 20.0–28.0)
O2 Saturation: 100 %
O2 Saturation: 100 %
O2 Saturation: 90 %
O2 Saturation: 95 %
O2 Saturation: 96 %
O2 Saturation: 98 %
Patient temperature: 37
Patient temperature: 37.2
Patient temperature: 97
TCO2: 19 mmol/L (ref 0–100)
TCO2: 20 mmol/L (ref 0–100)
TCO2: 20 mmol/L (ref 0–100)
TCO2: 21 mmol/L (ref 0–100)
TCO2: 22 mmol/L (ref 0–100)
TCO2: 22 mmol/L (ref 0–100)
pCO2 arterial: 29 mmHg — ABNORMAL LOW (ref 32.0–48.0)
pCO2 arterial: 32.9 mmHg (ref 32.0–48.0)
pCO2 arterial: 34.1 mmHg (ref 32.0–48.0)
pCO2 arterial: 35.4 mmHg (ref 32.0–48.0)
pCO2 arterial: 37.1 mmHg (ref 32.0–48.0)
pCO2 arterial: 46.3 mmHg (ref 32.0–48.0)
pH, Arterial: 7.249 — ABNORMAL LOW (ref 7.350–7.450)
pH, Arterial: 7.338 — ABNORMAL LOW (ref 7.350–7.450)
pH, Arterial: 7.354 (ref 7.350–7.450)
pH, Arterial: 7.375 (ref 7.350–7.450)
pH, Arterial: 7.376 (ref 7.350–7.450)
pH, Arterial: 7.395 (ref 7.350–7.450)
pO2, Arterial: 111 mmHg — ABNORMAL HIGH (ref 83.0–108.0)
pO2, Arterial: 284 mmHg — ABNORMAL HIGH (ref 83.0–108.0)
pO2, Arterial: 457 mmHg — ABNORMAL HIGH (ref 83.0–108.0)
pO2, Arterial: 70 mmHg — ABNORMAL LOW (ref 83.0–108.0)
pO2, Arterial: 77 mmHg — ABNORMAL LOW (ref 83.0–108.0)
pO2, Arterial: 79 mmHg — ABNORMAL LOW (ref 83.0–108.0)

## 2017-04-12 LAB — CBC
HCT: 35.3 % — ABNORMAL LOW (ref 36.0–46.0)
HCT: 36.5 % (ref 36.0–46.0)
HCT: 31.1 % — ABNORMAL LOW (ref 36.0–46.0)
Hemoglobin: 11.4 g/dL — ABNORMAL LOW (ref 12.0–15.0)
Hemoglobin: 11.9 g/dL — ABNORMAL LOW (ref 12.0–15.0)
Hemoglobin: 10.3 g/dL — ABNORMAL LOW (ref 12.0–15.0)
MCH: 23.8 pg — ABNORMAL LOW (ref 26.0–34.0)
MCH: 24.6 pg — ABNORMAL LOW (ref 26.0–34.0)
MCH: 24.7 pg — ABNORMAL LOW (ref 26.0–34.0)
MCHC: 32.3 g/dL (ref 30.0–36.0)
MCHC: 32.6 g/dL (ref 30.0–36.0)
MCHC: 33.1 g/dL (ref 30.0–36.0)
MCV: 73.7 fL — ABNORMAL LOW (ref 78.0–100.0)
MCV: 75.6 fL — ABNORMAL LOW (ref 78.0–100.0)
MCV: 74.6 fL — ABNORMAL LOW (ref 78.0–100.0)
Platelets: 175 10*3/uL (ref 150–400)
Platelets: 134 10*3/uL — ABNORMAL LOW (ref 150–400)
Platelets: 120 10*3/uL — ABNORMAL LOW (ref 150–400)
RBC: 4.79 MIL/uL (ref 3.87–5.11)
RBC: 4.83 MIL/uL (ref 3.87–5.11)
RBC: 4.17 MIL/uL (ref 3.87–5.11)
RDW: 15.9 % — ABNORMAL HIGH (ref 11.5–15.5)
RDW: 16.7 % — ABNORMAL HIGH (ref 11.5–15.5)
RDW: 16.3 % — ABNORMAL HIGH (ref 11.5–15.5)
WBC: 10.9 10*3/uL — ABNORMAL HIGH (ref 4.0–10.5)
WBC: 24.3 10*3/uL — ABNORMAL HIGH (ref 4.0–10.5)
WBC: 17.2 10*3/uL — ABNORMAL HIGH (ref 4.0–10.5)

## 2017-04-12 LAB — BASIC METABOLIC PANEL
Anion gap: 8 (ref 5–15)
BUN: 15 mg/dL (ref 6–20)
CO2: 19 mmol/L — ABNORMAL LOW (ref 22–32)
Calcium: 9.1 mg/dL (ref 8.9–10.3)
Chloride: 109 mmol/L (ref 101–111)
Creatinine, Ser: 1.2 mg/dL — ABNORMAL HIGH (ref 0.44–1.00)
GFR calc Af Amer: 55 mL/min — ABNORMAL LOW (ref >60)
GFR calc non Af Amer: 48 mL/min — ABNORMAL LOW (ref >60)
Glucose, Bld: 134 mg/dL — ABNORMAL HIGH (ref 65–99)
Potassium: 4.8 mmol/L (ref 3.5–5.1)
Sodium: 136 mmol/L (ref 135–145)

## 2017-04-12 LAB — HEMOGLOBIN AND HEMATOCRIT, BLOOD
HCT: 26 % — ABNORMAL LOW (ref 36.0–46.0)
Hemoglobin: 8.2 g/dL — ABNORMAL LOW (ref 12.0–15.0)

## 2017-04-12 LAB — POCT I-STAT 4, (NA,K, GLUC, HGB,HCT)
Glucose, Bld: 159 mg/dL — ABNORMAL HIGH (ref 65–99)
HCT: 34 % — ABNORMAL LOW (ref 36.0–46.0)
Hemoglobin: 11.6 g/dL — ABNORMAL LOW (ref 12.0–15.0)
Potassium: 4.8 mmol/L (ref 3.5–5.1)
Sodium: 140 mmol/L (ref 135–145)

## 2017-04-12 LAB — GLUCOSE, CAPILLARY
Glucose-Capillary: 107 mg/dL — ABNORMAL HIGH (ref 65–99)
Glucose-Capillary: 121 mg/dL — ABNORMAL HIGH (ref 65–99)
Glucose-Capillary: 123 mg/dL — ABNORMAL HIGH (ref 65–99)
Glucose-Capillary: 127 mg/dL — ABNORMAL HIGH (ref 65–99)
Glucose-Capillary: 133 mg/dL — ABNORMAL HIGH (ref 65–99)
Glucose-Capillary: 146 mg/dL — ABNORMAL HIGH (ref 65–99)
Glucose-Capillary: 92 mg/dL (ref 65–99)
Glucose-Capillary: 93 mg/dL (ref 65–99)

## 2017-04-12 LAB — PLATELET COUNT: Platelets: 159 10*3/uL (ref 150–400)

## 2017-04-12 LAB — CREATININE, SERUM
Creatinine, Ser: 0.97 mg/dL (ref 0.44–1.00)
GFR calc Af Amer: >60 mL/min (ref >60)
GFR calc non Af Amer: >60 mL/min (ref >60)

## 2017-04-12 LAB — APTT
aPTT: 58 seconds — ABNORMAL HIGH (ref 24–36)
aPTT: 31 seconds (ref 24–36)

## 2017-04-12 LAB — PROTIME-INR
INR: 1.23
INR: 1.6
Prothrombin Time: 15.6 seconds — ABNORMAL HIGH (ref 11.4–15.2)
Prothrombin Time: 19.2 seconds — ABNORMAL HIGH (ref 11.4–15.2)

## 2017-04-12 LAB — HEPARIN LEVEL (UNFRACTIONATED): Heparin Unfractionated: 0.28 IU/mL — ABNORMAL LOW (ref 0.30–0.70)

## 2017-04-12 LAB — MAGNESIUM: Magnesium: 3 mg/dL — ABNORMAL HIGH (ref 1.7–2.4)

## 2017-04-12 LAB — PREPARE RBC (CROSSMATCH)

## 2017-04-12 SURGERY — CORONARY ARTERY BYPASS GRAFTING (CABG)
Anesthesia: General | Site: Chest

## 2017-04-12 MED ORDER — FENTANYL CITRATE (PF) 250 MCG/5ML IJ SOLN
INTRAMUSCULAR | Status: DC | PRN
  Administered 2017-04-12: 100 ug via INTRAVENOUS
  Administered 2017-04-12: 200 ug via INTRAVENOUS
  Administered 2017-04-12 (×2): 100 ug via INTRAVENOUS
  Administered 2017-04-12 (×2): 50 ug via INTRAVENOUS
  Administered 2017-04-12: 100 ug via INTRAVENOUS
  Administered 2017-04-12: 50 ug via INTRAVENOUS
  Administered 2017-04-12: 150 ug via INTRAVENOUS
  Administered 2017-04-12 (×3): 100 ug via INTRAVENOUS
  Administered 2017-04-12: 50 ug via INTRAVENOUS

## 2017-04-12 MED ORDER — ACETAMINOPHEN 500 MG PO TABS
1000.0000 mg | ORAL_TABLET | Freq: Four times a day (QID) | ORAL | Status: AC
  Administered 2017-04-12 – 2017-04-17 (×19): 1000 mg via ORAL
  Filled 2017-04-12 (×18): qty 2

## 2017-04-12 MED ORDER — ROCURONIUM BROMIDE 10 MG/ML (PF) SYRINGE
PREFILLED_SYRINGE | INTRAVENOUS | Status: AC
Start: 2017-04-12 — End: 2017-04-12
  Filled 2017-04-12: qty 5

## 2017-04-12 MED ORDER — HEMOSTATIC AGENTS (NO CHARGE) OPTIME
TOPICAL | Status: DC | PRN
  Administered 2017-04-12 (×2): 1 via TOPICAL

## 2017-04-12 MED ORDER — TRAMADOL HCL 50 MG PO TABS
50.0000 mg | ORAL_TABLET | ORAL | Status: DC | PRN
  Administered 2017-04-20 – 2017-04-21 (×2): 100 mg via ORAL
  Filled 2017-04-12 (×2): qty 2

## 2017-04-12 MED ORDER — LIDOCAINE HCL (CARDIAC) 20 MG/ML IV SOLN
1.0000 mg/kg | Freq: Once | INTRAVENOUS | Status: AC
  Administered 2017-04-12: 91.6 mg via INTRAVENOUS

## 2017-04-12 MED ORDER — VANCOMYCIN HCL IN DEXTROSE 1-5 GM/200ML-% IV SOLN
1000.0000 mg | Freq: Once | INTRAVENOUS | Status: AC
  Administered 2017-04-12: 1000 mg via INTRAVENOUS
  Filled 2017-04-12: qty 200

## 2017-04-12 MED ORDER — EPINEPHRINE PF 1 MG/ML IJ SOLN
0.0000 ug/min | INTRAVENOUS | Status: DC
  Filled 2017-04-12: qty 4

## 2017-04-12 MED ORDER — LACTATED RINGERS IV SOLN
INTRAVENOUS | Status: DC
Start: 2017-04-12 — End: 2017-04-21

## 2017-04-12 MED ORDER — ASPIRIN 81 MG PO CHEW: 324.0000 mg | CHEWABLE_TABLET | Freq: Every day | ORAL | Status: DC

## 2017-04-12 MED ORDER — CEFUROXIME SODIUM 1.5 G IV SOLR
1.5000 g | Freq: Two times a day (BID) | INTRAVENOUS | Status: AC
  Administered 2017-04-12 – 2017-04-14 (×4): 1.5 g via INTRAVENOUS
  Filled 2017-04-12 (×4): qty 1.5

## 2017-04-12 MED ORDER — MORPHINE SULFATE (PF) 2 MG/ML IV SOLN: 1.0000 mg | INTRAVENOUS | Status: AC | PRN

## 2017-04-12 MED ORDER — SODIUM CHLORIDE 0.9 % IV SOLN
0.0000 ug/kg/h | INTRAVENOUS | Status: DC
  Administered 2017-04-12: 0.7 ug/kg/h via INTRAVENOUS
  Filled 2017-04-12: qty 2

## 2017-04-12 MED ORDER — TRANEXAMIC ACID 1000 MG/10ML IV SOLN
1.5000 mg/kg/h | INTRAVENOUS | Status: DC
  Filled 2017-04-12: qty 25

## 2017-04-12 MED ORDER — PANTOPRAZOLE SODIUM 40 MG PO TBEC
40.0000 mg | DELAYED_RELEASE_TABLET | Freq: Every day | ORAL | Status: DC
  Administered 2017-04-14 – 2017-04-21 (×8): 40 mg via ORAL
  Filled 2017-04-12 (×8): qty 1

## 2017-04-12 MED ORDER — LIDOCAINE IN D5W 4-5 MG/ML-% IV SOLN
1.0000 mg/min | INTRAVENOUS | Status: DC
  Administered 2017-04-12 – 2017-04-14 (×2): 1 mg/min via INTRAVENOUS
  Filled 2017-04-12 (×2): qty 500

## 2017-04-12 MED ORDER — DOPAMINE-DEXTROSE 3.2-5 MG/ML-% IV SOLN: 0.0000 ug/kg/min | INTRAVENOUS | Status: DC

## 2017-04-12 MED ORDER — OXYCODONE HCL 5 MG PO TABS: 5.0000 mg | ORAL_TABLET | ORAL | Status: DC | PRN

## 2017-04-12 MED ORDER — DOCUSATE SODIUM 100 MG PO CAPS
200.0000 mg | ORAL_CAPSULE | Freq: Every day | ORAL | Status: DC
  Administered 2017-04-13 – 2017-04-15 (×3): 200 mg via ORAL
  Filled 2017-04-12 (×6): qty 2

## 2017-04-12 MED ORDER — INSULIN REGULAR BOLUS VIA INFUSION
0.0000 [IU] | Freq: Three times a day (TID) | INTRAVENOUS | Status: DC
  Filled 2017-04-12: qty 10

## 2017-04-12 MED ORDER — SODIUM CHLORIDE 0.9 % IV SOLN
INTRAVENOUS | Status: DC | PRN
  Administered 2017-04-12: 15:00:00 via INTRAVENOUS

## 2017-04-12 MED ORDER — NOREPINEPHRINE BITARTRATE 1 MG/ML IV SOLN
0.0000 ug/min | INTRAVENOUS | Status: DC
  Filled 2017-04-12: qty 4

## 2017-04-12 MED ORDER — ETOMIDATE 2 MG/ML IV SOLN
INTRAVENOUS | Status: DC | PRN
  Administered 2017-04-12: 12 mg via INTRAVENOUS

## 2017-04-12 MED ORDER — LACTATED RINGERS IV SOLN
INTRAVENOUS | Status: DC | PRN
  Administered 2017-04-12 (×6): via INTRAVENOUS

## 2017-04-12 MED ORDER — SODIUM CHLORIDE 0.9 % IV SOLN
INTRAVENOUS | Status: DC
  Administered 2017-04-12: 6.9 [IU]/h via INTRAVENOUS
  Filled 2017-04-12 (×2): qty 1

## 2017-04-12 MED ORDER — PROTAMINE SULFATE 10 MG/ML IV SOLN
INTRAVENOUS | Status: AC
  Filled 2017-04-12: qty 5

## 2017-04-12 MED ORDER — PROTAMINE SULFATE 10 MG/ML IV SOLN
INTRAVENOUS | Status: AC
  Filled 2017-04-12: qty 25

## 2017-04-12 MED ORDER — BISACODYL 10 MG RE SUPP: 10.0000 mg | Freq: Every day | RECTAL | Status: DC

## 2017-04-12 MED ORDER — SODIUM CHLORIDE 0.9 % IV SOLN: INTRAVENOUS | Status: DC

## 2017-04-12 MED ORDER — PROPOFOL 10 MG/ML IV BOLUS
INTRAVENOUS | Status: AC
  Filled 2017-04-12: qty 20

## 2017-04-12 MED ORDER — EPHEDRINE SULFATE 50 MG/ML IJ SOLN
INTRAMUSCULAR | Status: DC | PRN
Start: 2017-04-12 — End: 2017-04-12
  Administered 2017-04-12 (×2): 5 mg via INTRAVENOUS

## 2017-04-12 MED ORDER — PROTAMINE SULFATE 10 MG/ML IV SOLN
INTRAVENOUS | Status: DC | PRN
Start: 2017-04-12 — End: 2017-04-12
  Administered 2017-04-12: 30 mg via INTRAVENOUS
  Administered 2017-04-12: 20 mg via INTRAVENOUS
  Administered 2017-04-12: 50 mg via INTRAVENOUS
  Administered 2017-04-12: 20 mg via INTRAVENOUS
  Administered 2017-04-12 (×2): 50 mg via INTRAVENOUS
  Administered 2017-04-12: 20 mg via INTRAVENOUS
  Administered 2017-04-12: 50 mg via INTRAVENOUS
  Administered 2017-04-12: 20 mg via INTRAVENOUS
  Administered 2017-04-12: 40 mg via INTRAVENOUS

## 2017-04-12 MED ORDER — HEPARIN SODIUM (PORCINE) 1000 UNIT/ML IJ SOLN
INTRAMUSCULAR | Status: DC | PRN
  Administered 2017-04-12: 38000 [IU] via INTRAVENOUS
  Administered 2017-04-12: 2000 [IU] via INTRAVENOUS

## 2017-04-12 MED ORDER — ORAL CARE MOUTH RINSE
15.0000 mL | Freq: Two times a day (BID) | OROMUCOSAL | Status: DC
  Administered 2017-04-12 – 2017-04-21 (×13): 15 mL via OROMUCOSAL

## 2017-04-12 MED ORDER — POTASSIUM CHLORIDE 10 MEQ/50ML IV SOLN: 10.0000 meq | INTRAVENOUS | Status: AC

## 2017-04-12 MED ORDER — ASPIRIN EC 325 MG PO TBEC
325.0000 mg | DELAYED_RELEASE_TABLET | Freq: Every day | ORAL | Status: DC
  Administered 2017-04-13 – 2017-04-21 (×9): 325 mg via ORAL
  Filled 2017-04-12 (×9): qty 1

## 2017-04-12 MED ORDER — ALBUMIN HUMAN 5 % IV SOLN
250.0000 mL | INTRAVENOUS | Status: AC | PRN
  Administered 2017-04-12 (×3): 250 mL via INTRAVENOUS
  Filled 2017-04-12: qty 250

## 2017-04-12 MED ORDER — ROCURONIUM BROMIDE 10 MG/ML (PF) SYRINGE
PREFILLED_SYRINGE | INTRAVENOUS | Status: DC | PRN
  Administered 2017-04-12 (×6): 50 mg via INTRAVENOUS

## 2017-04-12 MED ORDER — 0.9 % SODIUM CHLORIDE (POUR BTL) OPTIME
TOPICAL | Status: DC | PRN
  Administered 2017-04-12: 5000 mL

## 2017-04-12 MED ORDER — LACTATED RINGERS IV SOLN
INTRAVENOUS | Status: DC
  Administered 2017-04-15: 06:00:00 via INTRAVENOUS

## 2017-04-12 MED ORDER — SODIUM BICARBONATE 8.4 % IV SOLN
50.0000 meq | Freq: Once | INTRAVENOUS | Status: AC
  Administered 2017-04-12: 50 meq via INTRAVENOUS

## 2017-04-12 MED ORDER — SODIUM CHLORIDE 0.9% FLUSH
3.0000 mL | Freq: Two times a day (BID) | INTRAVENOUS | Status: DC
  Administered 2017-04-13 (×2): 3 mL via INTRAVENOUS
  Administered 2017-04-14: 10 mL via INTRAVENOUS
  Administered 2017-04-15 – 2017-04-20 (×11): 3 mL via INTRAVENOUS

## 2017-04-12 MED ORDER — HEPARIN SODIUM (PORCINE) 1000 UNIT/ML IJ SOLN
INTRAMUSCULAR | Status: AC
  Filled 2017-04-12: qty 1

## 2017-04-12 MED ORDER — PHENYLEPHRINE HCL 10 MG/ML IJ SOLN
INTRAVENOUS | Status: DC | PRN
  Administered 2017-04-12: 20 ug/min via INTRAVENOUS

## 2017-04-12 MED ORDER — LIDOCAINE 2% (20 MG/ML) 5 ML SYRINGE
INTRAMUSCULAR | Status: DC | PRN
  Administered 2017-04-12: 80 mg via INTRAVENOUS

## 2017-04-12 MED ORDER — SODIUM CHLORIDE 0.45 % IV SOLN
INTRAVENOUS | Status: DC | PRN
  Administered 2017-04-12: 17:00:00 via INTRAVENOUS

## 2017-04-12 MED ORDER — PHENYLEPHRINE HCL 10 MG/ML IJ SOLN
INTRAMUSCULAR | Status: DC | PRN
  Administered 2017-04-12: 35 ug/min via INTRAVENOUS
  Administered 2017-04-12: 20 ug/min via INTRAVENOUS

## 2017-04-12 MED ORDER — SODIUM CHLORIDE 0.9 % IV SOLN: 250.0000 mL | INTRAVENOUS | Status: DC

## 2017-04-12 MED ORDER — MILRINONE LACTATE IN DEXTROSE 20-5 MG/100ML-% IV SOLN
0.1250 ug/kg/min | INTRAVENOUS | Status: AC
  Administered 2017-04-12: 0.25 ug/kg/min via INTRAVENOUS
  Filled 2017-04-12: qty 100

## 2017-04-12 MED ORDER — ACETAMINOPHEN 160 MG/5ML PO SOLN: 650.0000 mg | Freq: Once | ORAL | Status: AC

## 2017-04-12 MED ORDER — SODIUM CHLORIDE 0.9% FLUSH: 3.0000 mL | INTRAVENOUS | Status: DC | PRN

## 2017-04-12 MED ORDER — BISACODYL 5 MG PO TBEC
10.0000 mg | DELAYED_RELEASE_TABLET | Freq: Every day | ORAL | Status: DC
  Administered 2017-04-13 – 2017-04-15 (×3): 10 mg via ORAL
  Filled 2017-04-12 (×3): qty 2

## 2017-04-12 MED ORDER — ONDANSETRON HCL 4 MG/2ML IJ SOLN
4.0000 mg | Freq: Four times a day (QID) | INTRAMUSCULAR | Status: DC | PRN
  Administered 2017-04-13: 4 mg via INTRAVENOUS
  Filled 2017-04-12 (×2): qty 2

## 2017-04-12 MED ORDER — ETOMIDATE 2 MG/ML IV SOLN
INTRAVENOUS | Status: AC
  Filled 2017-04-12: qty 10

## 2017-04-12 MED ORDER — METOPROLOL TARTRATE 5 MG/5ML IV SOLN: 2.5000 mg | INTRAVENOUS | Status: DC | PRN

## 2017-04-12 MED ORDER — METOPROLOL TARTRATE 12.5 MG HALF TABLET
12.5000 mg | ORAL_TABLET | Freq: Two times a day (BID) | ORAL | Status: DC
  Administered 2017-04-13 – 2017-04-21 (×16): 12.5 mg via ORAL
  Filled 2017-04-12 (×16): qty 1

## 2017-04-12 MED ORDER — ACETAMINOPHEN 650 MG RE SUPP
650.0000 mg | Freq: Once | RECTAL | Status: AC
  Administered 2017-04-12: 650 mg via RECTAL

## 2017-04-12 MED ORDER — MIDAZOLAM HCL 5 MG/5ML IJ SOLN
INTRAMUSCULAR | Status: DC | PRN
  Administered 2017-04-12: 2 mg via INTRAVENOUS
  Administered 2017-04-12 (×2): 4 mg via INTRAVENOUS

## 2017-04-12 MED ORDER — MORPHINE SULFATE (PF) 2 MG/ML IV SOLN: 2.0000 mg | INTRAVENOUS | Status: DC | PRN

## 2017-04-12 MED ORDER — MORPHINE SULFATE (PF) 4 MG/ML IV SOLN
2.0000 mg | INTRAVENOUS | Status: DC | PRN
  Administered 2017-04-13 (×3): 4 mg via INTRAVENOUS
  Administered 2017-04-14: 2 mg via INTRAVENOUS
  Filled 2017-04-12 (×4): qty 1

## 2017-04-12 MED ORDER — PHENYLEPHRINE HCL 10 MG/ML IJ SOLN
30.0000 ug/min | INTRAMUSCULAR | Status: DC
  Administered 2017-04-12: 50 ug/min via INTRAVENOUS
  Filled 2017-04-12 (×2): qty 2

## 2017-04-12 MED ORDER — THROMBIN 20000 UNITS EX SOLR
OROMUCOSAL | Status: DC | PRN
  Administered 2017-04-12: 12 mL via TOPICAL

## 2017-04-12 MED ORDER — LACTATED RINGERS IV SOLN
500.0000 mL | Freq: Once | INTRAVENOUS | Status: AC | PRN
  Administered 2017-04-12: 500 mL via INTRAVENOUS

## 2017-04-12 MED ORDER — PROPOFOL 10 MG/ML IV BOLUS
INTRAVENOUS | Status: DC | PRN
  Administered 2017-04-12: 60 mg via INTRAVENOUS

## 2017-04-12 MED ORDER — ATORVASTATIN CALCIUM 80 MG PO TABS
80.0000 mg | ORAL_TABLET | Freq: Every day | ORAL | Status: DC
  Administered 2017-04-13 – 2017-04-15 (×3): 80 mg via ORAL
  Filled 2017-04-12 (×3): qty 1

## 2017-04-12 MED ORDER — FENTANYL CITRATE (PF) 250 MCG/5ML IJ SOLN
INTRAMUSCULAR | Status: AC
  Filled 2017-04-12: qty 25

## 2017-04-12 MED ORDER — MAGNESIUM SULFATE 4 GM/100ML IV SOLN
4.0000 g | Freq: Once | INTRAVENOUS | Status: AC
  Administered 2017-04-12: 4 g via INTRAVENOUS
  Filled 2017-04-12: qty 100

## 2017-04-12 MED ORDER — SUCCINYLCHOLINE CHLORIDE 20 MG/ML IJ SOLN
INTRAMUSCULAR | Status: DC | PRN
  Administered 2017-04-12: 120 mg via INTRAVENOUS

## 2017-04-12 MED ORDER — MIDAZOLAM HCL 10 MG/2ML IJ SOLN
INTRAMUSCULAR | Status: AC
  Filled 2017-04-12: qty 2

## 2017-04-12 MED ORDER — MIDAZOLAM HCL 2 MG/2ML IJ SOLN: 2.0000 mg | INTRAMUSCULAR | Status: DC | PRN

## 2017-04-12 MED ORDER — MILRINONE LACTATE IN DEXTROSE 20-5 MG/100ML-% IV SOLN
0.2500 ug/kg/min | INTRAVENOUS | Status: DC
  Administered 2017-04-12 – 2017-04-13 (×3): 0.25 ug/kg/min via INTRAVENOUS
  Filled 2017-04-12 (×3): qty 100

## 2017-04-12 MED ORDER — FAMOTIDINE IN NACL 20-0.9 MG/50ML-% IV SOLN
20.0000 mg | Freq: Two times a day (BID) | INTRAVENOUS | Status: AC
  Administered 2017-04-12 (×2): 20 mg via INTRAVENOUS
  Filled 2017-04-12: qty 50

## 2017-04-12 MED ORDER — METOPROLOL TARTRATE 25 MG/10 ML ORAL SUSPENSION: 12.5000 mg | Freq: Two times a day (BID) | ORAL | Status: DC

## 2017-04-12 MED ORDER — CHLORHEXIDINE GLUCONATE 0.12 % MT SOLN
15.0000 mL | OROMUCOSAL | Status: AC
  Administered 2017-04-12: 15 mL via OROMUCOSAL
  Filled 2017-04-12: qty 15

## 2017-04-12 MED ORDER — ACETAMINOPHEN 160 MG/5ML PO SOLN: 1000.0000 mg | Freq: Four times a day (QID) | ORAL | Status: DC

## 2017-04-12 MED ORDER — NOREPINEPHRINE BITARTRATE 1 MG/ML IV SOLN
0.0000 ug/min | INTRAVENOUS | Status: AC
  Administered 2017-04-12: 1 ug/min via INTRAVENOUS
  Filled 2017-04-12: qty 4

## 2017-04-12 MED ORDER — SODIUM CHLORIDE 0.9 % IV SOLN
0.0000 ug/min | INTRAVENOUS | Status: DC
  Filled 2017-04-12 (×2): qty 1

## 2017-04-12 MED ORDER — ORAL CARE MOUTH RINSE: 15.0000 mL | Freq: Four times a day (QID) | OROMUCOSAL | Status: DC

## 2017-04-12 MED ORDER — LIDOCAINE HCL (PF) 2 % IJ SOLN: 100.0000 mg | Freq: Once | INTRAMUSCULAR | Status: DC

## 2017-04-12 MED ORDER — CHLORHEXIDINE GLUCONATE 0.12% ORAL RINSE (MEDLINE KIT)
15.0000 mL | Freq: Two times a day (BID) | OROMUCOSAL | Status: DC
  Administered 2017-04-12: 15 mL via OROMUCOSAL

## 2017-04-12 MED ORDER — PHENYLEPHRINE HCL 10 MG/ML IJ SOLN
INTRAMUSCULAR | Status: DC | PRN
  Administered 2017-04-12: 40 ug via INTRAVENOUS

## 2017-04-12 MED FILL — Magnesium Sulfate Inj 50%: INTRAMUSCULAR | Qty: 10 | Status: AC

## 2017-04-12 MED FILL — Heparin Sodium (Porcine) Inj 1000 Unit/ML: INTRAMUSCULAR | Qty: 10 | Status: AC

## 2017-04-12 MED FILL — Heparin Sodium (Porcine) Inj 1000 Unit/ML: INTRAMUSCULAR | Qty: 30 | Status: AC

## 2017-04-12 MED FILL — Heparin Sodium (Porcine) Inj 1000 Unit/ML: INTRAMUSCULAR | Qty: 60 | Status: AC

## 2017-04-12 MED FILL — Lidocaine HCl IV Inj 20 MG/ML: INTRAVENOUS | Qty: 5 | Status: AC

## 2017-04-12 MED FILL — Electrolyte-R (PH 7.4) Solution: INTRAVENOUS | Qty: 3000 | Status: AC

## 2017-04-12 MED FILL — Mannitol IV Soln 20%: INTRAVENOUS | Qty: 500 | Status: AC

## 2017-04-12 MED FILL — Sodium Bicarbonate IV Soln 8.4%: INTRAVENOUS | Qty: 100 | Status: AC

## 2017-04-12 MED FILL — Sodium Chloride IV Soln 0.9%: INTRAVENOUS | Qty: 2000 | Status: AC

## 2017-04-12 MED FILL — Potassium Chloride Inj 2 mEq/ML: INTRAVENOUS | Qty: 10 | Status: AC

## 2017-04-12 SURGICAL SUPPLY — 138 items
ADAPTER CARDIO PERF ANTE/RETRO (ADAPTER) ×8 IMPLANT
ADPR PRFSN 84XANTGRD RTRGD (ADAPTER) ×4
AGENT HMST KT MTR STRL THRMB (HEMOSTASIS) ×2
APPLICATOR COTTON TIP 6IN STRL (MISCELLANEOUS) IMPLANT
BAG DECANTER FOR FLEXI CONT (MISCELLANEOUS) ×6 IMPLANT
BANDAGE ACE 4X5 VEL STRL LF (GAUZE/BANDAGES/DRESSINGS) ×4 IMPLANT
BANDAGE ACE 6X5 VEL STRL LF (GAUZE/BANDAGES/DRESSINGS) ×4 IMPLANT
BASKET HEART  (ORDER IN 25'S) (MISCELLANEOUS) ×1
BASKET HEART (ORDER IN 25'S) (MISCELLANEOUS) ×1
BASKET HEART (ORDER IN 25S) (MISCELLANEOUS) ×2 IMPLANT
BINDER BREAST XXLRG (GAUZE/BANDAGES/DRESSINGS) ×2 IMPLANT
BLADE CLIPPER SURG (BLADE) IMPLANT
BLADE STERNUM SYSTEM 6 (BLADE) ×6 IMPLANT
BLADE SURG 11 STRL SS (BLADE) ×2 IMPLANT
BLADE SURG 12 STRL SS (BLADE) ×6 IMPLANT
BLADE SURG 15 STRL LF DISP TIS (BLADE) ×2 IMPLANT
BLADE SURG 15 STRL SS (BLADE) ×8
BNDG GAUZE ELAST 4 BULKY (GAUZE/BANDAGES/DRESSINGS) ×4 IMPLANT
BOOT SUTURE AID YELLOW STND (SUTURE) IMPLANT
CANISTER SUCT 3000ML PPV (MISCELLANEOUS) ×6 IMPLANT
CANN PRFSN 3/8XRT ANG TPR 14 (MISCELLANEOUS) ×2
CANNULA GUNDRY RCSP 15FR (MISCELLANEOUS) ×6 IMPLANT
CANNULA PRFSN 3/8XRT ANG TPR14 (MISCELLANEOUS) IMPLANT
CANNULA SUMP PERICARDIAL (CANNULA) ×2 IMPLANT
CANNULA VEN MTL TIP RT (MISCELLANEOUS) ×4
CANNULA VRC MALB SNGL STG 28FR (MISCELLANEOUS) IMPLANT
CATH CPB KIT VANTRIGT (MISCELLANEOUS) ×4 IMPLANT
CATH HEART VENT LEFT (CATHETERS) IMPLANT
CATH ROBINSON RED A/P 18FR (CATHETERS) ×20 IMPLANT
CATH THORACIC 36FR RT ANG (CATHETERS) ×6 IMPLANT
CLIP FOGARTY SPRING 6M (CLIP) ×2 IMPLANT
CLIP TI WIDE RED SMALL 24 (CLIP) ×8 IMPLANT
CONN 1/2X1/2X1/2  BEN (MISCELLANEOUS) ×4
CONN 1/2X1/2X1/2 BEN (MISCELLANEOUS) ×2 IMPLANT
CONN 3/8X1/2 ST GISH (MISCELLANEOUS) ×10 IMPLANT
CONN 3/8X3/8 GISH STERILE (MISCELLANEOUS) ×2 IMPLANT
CONNECTOR 1/2X3/8X1/2 3 WAY (MISCELLANEOUS)
CONNECTOR 1/2X3/8X1/2 3WAY (MISCELLANEOUS) IMPLANT
COUNTER NEEDLE 20 DBL MAG RED (NEEDLE) ×2 IMPLANT
COVER SURGICAL LIGHT HANDLE (MISCELLANEOUS) ×2 IMPLANT
CRADLE DONUT ADULT HEAD (MISCELLANEOUS) ×6 IMPLANT
DRAIN CHANNEL 32F RND 10.7 FF (WOUND CARE) ×4 IMPLANT
DRAPE CARDIOVASCULAR INCISE (DRAPES) ×4
DRAPE SLUSH/WARMER DISC (DRAPES) ×6 IMPLANT
DRAPE SRG 135X102X78XABS (DRAPES) ×4 IMPLANT
DRSG AQUACEL AG ADV 3.5X14 (GAUZE/BANDAGES/DRESSINGS) ×6 IMPLANT
ELECT BLADE 4.0 EZ CLEAN MEGAD (MISCELLANEOUS) ×4
ELECT BLADE 6.5 EXT (BLADE) ×6 IMPLANT
ELECT CAUTERY BLADE 6.4 (BLADE) ×6 IMPLANT
ELECT REM PT RETURN 9FT ADLT (ELECTROSURGICAL) ×8
ELECTRODE BLDE 4.0 EZ CLN MEGD (MISCELLANEOUS) ×2 IMPLANT
ELECTRODE REM PT RTRN 9FT ADLT (ELECTROSURGICAL) ×8 IMPLANT
FELT TEFLON 1X6 (MISCELLANEOUS) ×10 IMPLANT
GAUZE SPONGE 4X4 12PLY STRL (GAUZE/BANDAGES/DRESSINGS) ×12 IMPLANT
GLOVE BIO SURGEON STRL SZ 6 (GLOVE) IMPLANT
GLOVE BIO SURGEON STRL SZ 6.5 (GLOVE) ×3 IMPLANT
GLOVE BIO SURGEON STRL SZ7 (GLOVE) ×10 IMPLANT
GLOVE BIO SURGEON STRL SZ7.5 (GLOVE) ×16 IMPLANT
GLOVE BIO SURGEON STRL SZ8.5 (GLOVE) ×2 IMPLANT
GLOVE BIO SURGEONS STRL SZ 6.5 (GLOVE) ×3
GLOVE BIOGEL PI IND STRL 6.5 (GLOVE) IMPLANT
GLOVE BIOGEL PI IND STRL 7.0 (GLOVE) IMPLANT
GLOVE BIOGEL PI INDICATOR 6.5 (GLOVE) ×8
GLOVE BIOGEL PI INDICATOR 7.0 (GLOVE) ×2
GLOVE SURG SS PI 6.0 STRL IVOR (GLOVE) ×4 IMPLANT
GOWN STRL REUS W/ TWL LRG LVL3 (GOWN DISPOSABLE) ×16 IMPLANT
GOWN STRL REUS W/TWL LRG LVL3 (GOWN DISPOSABLE) ×36
HEMOSTAT POWDER SURGIFOAM 1G (HEMOSTASIS) ×18 IMPLANT
HEMOSTAT SURGICEL 2X14 (HEMOSTASIS) ×6 IMPLANT
INSERT FOGARTY XLG (MISCELLANEOUS) IMPLANT
KIT BASIN OR (CUSTOM PROCEDURE TRAY) ×6 IMPLANT
KIT ROOM TURNOVER OR (KITS) ×6 IMPLANT
KIT SUCTION CATH 14FR (SUCTIONS) ×8 IMPLANT
KIT VASOVIEW HEMOPRO VH 3000 (KITS) ×4 IMPLANT
LEAD PACING MYOCARDI (MISCELLANEOUS) ×4 IMPLANT
LINE VENT (MISCELLANEOUS) ×2 IMPLANT
LOOP VESSEL SUPERMAXI WHITE (MISCELLANEOUS) ×2 IMPLANT
MARKER GRAFT CORONARY BYPASS (MISCELLANEOUS) ×12 IMPLANT
NS IRRIG 1000ML POUR BTL (IV SOLUTION) ×32 IMPLANT
PACK OPEN HEART (CUSTOM PROCEDURE TRAY) ×6 IMPLANT
PAD ARMBOARD 7.5X6 YLW CONV (MISCELLANEOUS) ×12 IMPLANT
PAD ELECT DEFIB RADIOL ZOLL (MISCELLANEOUS) ×4 IMPLANT
PENCIL BUTTON HOLSTER BLD 10FT (ELECTRODE) ×4 IMPLANT
PUNCH AORTIC ROTATE  4.5MM 8IN (MISCELLANEOUS) ×2 IMPLANT
PUNCH AORTIC ROTATE 4.0MM (MISCELLANEOUS) IMPLANT
PUNCH AORTIC ROTATE 4.5MM 8IN (MISCELLANEOUS) IMPLANT
PUNCH AORTIC ROTATE 5MM 8IN (MISCELLANEOUS) IMPLANT
SET CARDIOPLEGIA MPS 5001102 (MISCELLANEOUS) ×2 IMPLANT
SOLUTION ANTI FOG 6CC (MISCELLANEOUS) ×2 IMPLANT
SPONGE LAP 18X18 X RAY DECT (DISPOSABLE) ×6 IMPLANT
SUCKER WEIGHTED FLEX (MISCELLANEOUS) ×4 IMPLANT
SURGIFLO W/THROMBIN 8M KIT (HEMOSTASIS) ×6 IMPLANT
SUT BONE WAX W31G (SUTURE) ×6 IMPLANT
SUT ETHIBON 2 0 V 52N 30 (SUTURE) ×2 IMPLANT
SUT ETHIBOND 2 0 SH (SUTURE) ×12 IMPLANT
SUT ETHIBOND 2 0 SH 36X2 (SUTURE) ×4 IMPLANT
SUT ETHIBOND 2 0 V4 (SUTURE) IMPLANT
SUT ETHIBOND 2 0V4 GREEN (SUTURE) IMPLANT
SUT ETHIBOND 4 0 TF (SUTURE) IMPLANT
SUT ETHIBOND 5 0 C 1 30 (SUTURE) IMPLANT
SUT MNCRL AB 4-0 PS2 18 (SUTURE) ×4 IMPLANT
SUT PROLENE 3 0 RB 1 (SUTURE) ×4 IMPLANT
SUT PROLENE 3 0 SH 1 (SUTURE) ×4 IMPLANT
SUT PROLENE 3 0 SH DA (SUTURE) ×8 IMPLANT
SUT PROLENE 3 0 SH1 36 (SUTURE) IMPLANT
SUT PROLENE 4 0 RB 1 (SUTURE) ×8
SUT PROLENE 4 0 SH DA (SUTURE) ×20 IMPLANT
SUT PROLENE 4-0 RB1 .5 CRCL 36 (SUTURE) ×6 IMPLANT
SUT PROLENE 5 0 C 1 36 (SUTURE) IMPLANT
SUT PROLENE 6 0 C 1 30 (SUTURE) ×8 IMPLANT
SUT PROLENE 6 0 CC (SUTURE) ×12 IMPLANT
SUT PROLENE 8 0 BV175 6 (SUTURE) IMPLANT
SUT PROLENE BLUE 7 0 (SUTURE) ×8 IMPLANT
SUT SILK  1 MH (SUTURE)
SUT SILK 1 MH (SUTURE) IMPLANT
SUT SILK 2 0 SH CR/8 (SUTURE) ×2 IMPLANT
SUT SILK 3 0 SH CR/8 (SUTURE) ×2 IMPLANT
SUT STEEL 6MS V (SUTURE) ×12 IMPLANT
SUT STEEL SZ 6 DBL 3X14 BALL (SUTURE) ×6 IMPLANT
SUT VIC AB 1 CTX 36 (SUTURE) ×4
SUT VIC AB 1 CTX36XBRD ANBCTR (SUTURE) ×8 IMPLANT
SUT VIC AB 2-0 CT1 27 (SUTURE) ×4
SUT VIC AB 2-0 CT1 TAPERPNT 27 (SUTURE) IMPLANT
SUT VIC AB 2-0 CTX 27 (SUTURE) ×2 IMPLANT
SUT VIC AB 3-0 X1 27 (SUTURE) IMPLANT
SUTURE E-PAK OPEN HEART (SUTURE) ×6 IMPLANT
SYSTEM SAHARA CHEST DRAIN ATS (WOUND CARE) ×6 IMPLANT
TOWEL GREEN STERILE (TOWEL DISPOSABLE) ×17 IMPLANT
TOWEL GREEN STERILE FF (TOWEL DISPOSABLE) ×8 IMPLANT
TOWEL OR 17X24 6PK STRL BLUE (TOWEL DISPOSABLE) ×8 IMPLANT
TOWEL OR 17X26 10 PK STRL BLUE (TOWEL DISPOSABLE) ×8 IMPLANT
TRAY FOLEY SILVER 16FR TEMP (SET/KITS/TRAYS/PACK) ×6 IMPLANT
TUBING INSUFFLATION (TUBING) ×4 IMPLANT
UNDERPAD 30X30 (UNDERPADS AND DIAPERS) ×6 IMPLANT
VALVE MAGNA MITRAL 25MM (Prosthesis & Implant Heart) ×2 IMPLANT
VENT LEFT HEART 12002 (CATHETERS) ×4
VRC MALLEABLE SINGLE STG 28FR (MISCELLANEOUS) ×4
WATER STERILE IRR 1000ML POUR (IV SOLUTION) ×12 IMPLANT

## 2017-04-12 NOTE — Progress Notes (Signed)
ANTICOAGULATION CONSULT NOTE - Follow Up Consult  Pharmacy Consult for Heparin Indication: 3V CAD plan CABG  Allergies  Allergen Reactions  . No Known Allergies     Patient Measurements: Height: 5' 2.5" (158.8 cm) Weight: 203 lb 14.8 oz (92.5 kg) IBW/kg (Calculated) : 51.25 Heparin Dosing Weight: 74kg  Vital Signs: Temp: 100 F (37.8 C) (05/10 0000) Temp Source: Oral (05/10 0000) BP: 106/59 (05/09 1941) Pulse Rate: 82 (05/09 1601)  Labs:  Recent Labs  04/10/17 0217 04/11/17 0215 04/12/17 0226  HGB 11.1* 10.9* 11.4*  HCT 34.7* 34.2* 35.3*  PLT 170 186 175  APTT  --   --  58*  LABPROT  --   --  15.6*  INR  --   --  1.23  HEPARINUNFRC 0.43 0.43 0.28*  CREATININE 1.15* 1.18* 1.20*  TROPONINI 9.68*  --   --     Estimated Creatinine Clearance: 52.7 mL/min (A) (by C-G formula based on SCr of 1.2 mg/dL (H)).   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 RN reports the infusion was paused yesterday evening during her bath. Patient has been therapeutic on this rate for several days and is scheduled to go for CABG in a couple hours. CBC stable. No bleeding reported.   Goal of Therapy:  Heparin level 0.3-0.7 units/ml Monitor platelets by anticoagulation protocol: Yes   Plan:  1) Continue heparin gtt at 1750 units/hr 2) Daily heparin level/CBC 3) Monitor for s/sx of bleeding 4) CABG when able, likely 5/10 per CVTS note  Ruben Im, PharmD Clinical Pharmacist Pager: [phone] 04/12/2017 3:55 AM

## 2017-04-12 NOTE — Progress Notes (Signed)
The patient was examined and preop studies reviewed. There has been no change from the prior exam and the patient is ready for surgery. Plan CABG on [address]

## 2017-04-12 NOTE — Anesthesia Procedure Notes (Signed)
Procedure Name: Intubation Date/Time: 04/12/2017 7:51 AM Performed by: Belinda Block Pre-anesthesia Checklist: Patient identified, Emergency Drugs available, Suction available, Patient being monitored and Timeout performed Patient Re-evaluated:Patient Re-evaluated prior to inductionOxygen Delivery Method: Circle system utilized Preoxygenation: Pre-oxygenation with 100% oxygen Intubation Type: IV induction Ventilation: Mask ventilation without difficulty and Oral airway inserted - appropriate to patient size Laryngoscope Size: Mac and 4 Grade View: Grade I Tube type: Oral Tube size: 8.0 mm Number of attempts: 1 Airway Equipment and Method: Stylet Placement Confirmation: ETT inserted through vocal cords under direct vision,  positive ETCO2 and breath sounds checked- equal and bilateral Secured at: 22 cm Tube secured with: Tape Dental Injury: Teeth and Oropharynx as per pre-operative assessment

## 2017-04-12 NOTE — Transfer of Care (Signed)
Immediate Anesthesia Transfer of Care Note  Patient: Rachael Davis  Procedure(s) Performed: Procedure(s) with comments: CORONARY ARTERY BYPASS GRAFTING (CABG), ON PUMP, TIMES FOUR, USING LEFT INTERNAL MAMMARY ARTERY AND ENDOSCOPICALLY HARVESTED RIGHT GREATER SAPHENOUS VEIN (N/A) - LIMA to LAD; SVG to OM; SVG to Ramus; SVG to Right MITRAL VALVE REPLACEMENT (MVR) (N/A) - Using a 82mm Magna Ease Mitral Pericardial Bioprosthesis TRANSESOPHAGEAL ECHOCARDIOGRAM (TEE) (N/A)  Patient Location: ICU  Anesthesia Type:General  Level of Consciousness: sedated and Patient remains intubated per anesthesia plan  Airway & Oxygen Therapy: Patient remains intubated per anesthesia plan and Patient placed on Ventilator (see vital sign flow sheet for setting)  Post-op Assessment: Report given to RN and Post -op Vital signs reviewed and stable  Post vital signs: Reviewed and stable  Last Vitals:  Vitals:   04/12/17 0400 04/12/17 0515  BP:  103/60  Pulse:    Resp:  (!) 31  Temp: 37.3 C     Last Pain:  Vitals:   04/12/17 0400  TempSrc: Oral  PainSc:          Complications: No apparent anesthesia complications

## 2017-04-12 NOTE — Procedures (Signed)
Extubation Procedure Note  Patient Details:   Name: Rachael Davis DOB: Feb 01, 1956 MRN: [id number]   Airway Documentation:     Evaluation  O2 sats: stable throughout Complications: No apparent complications Patient did tolerate procedure well. Bilateral Breath Sounds: Clear   Yes   Patient did NIF -20 and FVC 1.1L. Patient extubated to 4L [place] and is tolerating well at this time. Patient able to verbalize her name.   Hiram Comber 04/12/2017, 10:56 PM

## 2017-04-12 NOTE — OR Nursing (Signed)
SICU 1st call made 1440.

## 2017-04-12 NOTE — Progress Notes (Signed)
  Echocardiogram Echocardiogram Transesophageal has been performed.  Janalyn Harder 04/12/2017, 9:41 AM

## 2017-04-12 NOTE — Anesthesia Preprocedure Evaluation (Signed)
Anesthesia Evaluation  Patient identified by MRN, date of birth, ID band Patient awake    Reviewed: Allergy & Precautions, NPO status , reviewed documented beta blocker date and time   Airway Mallampati: II  TM Distance: >3 FB     Dental   Pulmonary neg pulmonary ROS,    breath sounds clear to auscultation       Cardiovascular hypertension, + CAD and + Past MI   Rhythm:Regular Rate:Normal     Neuro/Psych    GI/Hepatic negative GI ROS, Neg liver ROS,   Endo/Other  diabetes  Renal/GU Renal disease     Musculoskeletal   Abdominal   Peds  Hematology   Anesthesia Other Findings   Reproductive/Obstetrics                             Anesthesia Physical Anesthesia Plan  ASA: III  Anesthesia Plan: General   Post-op Pain Management:    Induction: Intravenous  Airway Management Planned: Oral ETT  Additional Equipment: Arterial line, TEE, PA Cath and Ultrasound Guidance Line Placement  Intra-op Plan:   Post-operative Plan: Post-operative intubation/ventilation  Informed Consent: I have reviewed the patients History and Physical, chart, labs and discussed the procedure including the risks, benefits and alternatives for the proposed anesthesia with the patient or authorized representative who has indicated his/her understanding and acceptance.   Dental advisory given  Plan Discussed with: CRNA, Anesthesiologist and Surgeon  Anesthesia Plan Comments:         Anesthesia Quick Evaluation

## 2017-04-12 NOTE — Progress Notes (Signed)
Patient ID: Rachael Davis, female   DOB: [birth year], 61 y.o.   MRN: [id number]  SICU Evening Rounds:   Hemodynamically stable  CI = 2.1 on dop 2, milrinone 0.25  On lidocaine for PVC's. Seems to have resolved that. Paced at 90  Still on vent, weaning Precedex  Urine output good  CT output low  CBC    Component Value Date/Time   WBC 24.3 (H) 04/12/2017 1613   RBC 4.83 04/12/2017 1613   HGB 11.6 (L) 04/12/2017 1619   HCT 34.0 (L) 04/12/2017 1619   PLT 134 (L) 04/12/2017 1613   MCV 75.6 (L) 04/12/2017 1613   MCH 24.6 (L) 04/12/2017 1613   MCHC 32.6 04/12/2017 1613   RDW 16.7 (H) 04/12/2017 1613     BMET    Component Value Date/Time   NA 140 04/12/2017 1619   K 4.8 04/12/2017 1619   CL 106 04/12/2017 1451   CO2 19 (L) 04/12/2017 0226   GLUCOSE 159 (H) 04/12/2017 1619   BUN 10 04/12/2017 1451   CREATININE 0.80 04/12/2017 1451   CALCIUM 9.1 04/12/2017 0226   GFRNONAA 48 (L) 04/12/2017 0226   GFRAA 55 (L) 04/12/2017 0226     A/P:  Stable postop course. Continue current plans

## 2017-04-12 NOTE — Brief Op Note (Signed)
04/04/2017 - 04/12/2017  1:38 PM  PATIENT:  Rachael Davis  61 y.o. female  PRE-OPERATIVE DIAGNOSIS:  CAD MR  POST-OPERATIVE DIAGNOSIS:  CAD MR  PROCEDURE:  Procedure(s) with comments: CORONARY ARTERY BYPASS GRAFTING (CABG), ON PUMP, TIMES FOUR, USING LEFT INTERNAL MAMMARY ARTERY AND ENDOSCOPICALLY HARVESTED RIGHT GREATER SAPHENOUS VEIN (N/A) - LIMA to LAD; SVG to OM; SVG to Ramus; SVG to Right MITRAL VALVE REPLACEMENT (MVR) (N/A) - Using a 37mm Magna Ease Mitral Pericardial Bioprosthesis TRANSESOPHAGEAL ECHOCARDIOGRAM (TEE) (N/A)  SURGEON:  Surgeon(s) and Role:    Kerin Perna, MD - Primary  PHYSICIAN ASSISTANT:  Jari Favre, PA-C   ANESTHESIA:   general  EBL:  Total I/O In: 1000 [I.V.:1000] Out: 1275 [Urine:1275]  BLOOD ADMINISTERED:none  DRAINS: ROUTINE   LOCAL MEDICATIONS USED:  NONE  SPECIMEN:  Source of Specimen:  MITRAL LEAFLETS  DISPOSITION OF SPECIMEN:  PATHOLOGY  COUNTS:  YES  TOURNIQUET:  * No tourniquets in log *  DICTATION: .Dragon Dictation  PLAN OF CARE: Admit to inpatient   PATIENT DISPOSITION:  ICU - intubated and hemodynamically stable.   Delay start of Pharmacological VTE agent (>24hrs) due to surgical blood loss or risk of bleeding: yes

## 2017-04-12 NOTE — OR Nursing (Signed)
2nd call made to SICU at 1521

## 2017-04-13 ENCOUNTER — Inpatient Hospital Stay (HOSPITAL_COMMUNITY): Payer: Managed Care, Other (non HMO)

## 2017-04-13 ENCOUNTER — Encounter (HOSPITAL_COMMUNITY): Payer: Self-pay | Admitting: Cardiothoracic Surgery

## 2017-04-13 DIAGNOSIS — Z951 Presence of aortocoronary bypass graft: Secondary | ICD-10-CM

## 2017-04-13 LAB — BLOOD GAS, ARTERIAL
Acid-base deficit: 4.2 mmol/L — ABNORMAL HIGH (ref 0.0–2.0)
Bicarbonate: 20.2 mmol/L (ref 20.0–28.0)
Drawn by: 44135
O2 Content: 4 L/min
O2 Saturation: 98.8 %
Patient temperature: 98.6
pCO2 arterial: 36.2 mmHg (ref 32.0–48.0)
pH, Arterial: 7.366 (ref 7.350–7.450)
pO2, Arterial: 143 mmHg — ABNORMAL HIGH (ref 83.0–108.0)

## 2017-04-13 LAB — PREPARE FRESH FROZEN PLASMA
Unit division: 0
Unit division: 0

## 2017-04-13 LAB — CBC
HCT: 30.6 % — ABNORMAL LOW (ref 36.0–46.0)
HCT: 32.9 % — ABNORMAL LOW (ref 36.0–46.0)
Hemoglobin: 10 g/dL — ABNORMAL LOW (ref 12.0–15.0)
Hemoglobin: 10.5 g/dL — ABNORMAL LOW (ref 12.0–15.0)
MCH: 24.3 pg — ABNORMAL LOW (ref 26.0–34.0)
MCH: 24.1 pg — ABNORMAL LOW (ref 26.0–34.0)
MCHC: 32.7 g/dL (ref 30.0–36.0)
MCHC: 31.9 g/dL (ref 30.0–36.0)
MCV: 74.5 fL — ABNORMAL LOW (ref 78.0–100.0)
MCV: 75.5 fL — ABNORMAL LOW (ref 78.0–100.0)
Platelets: 132 10*3/uL — ABNORMAL LOW (ref 150–400)
Platelets: 132 10*3/uL — ABNORMAL LOW (ref 150–400)
RBC: 4.11 MIL/uL (ref 3.87–5.11)
RBC: 4.36 MIL/uL (ref 3.87–5.11)
RDW: 15.9 % — ABNORMAL HIGH (ref 11.5–15.5)
RDW: 16.3 % — ABNORMAL HIGH (ref 11.5–15.5)
WBC: 16.1 10*3/uL — ABNORMAL HIGH (ref 4.0–10.5)
WBC: 21.7 10*3/uL — ABNORMAL HIGH (ref 4.0–10.5)

## 2017-04-13 LAB — GLUCOSE, CAPILLARY
Glucose-Capillary: 203 mg/dL — ABNORMAL HIGH (ref 65–99)
Glucose-Capillary: 108 mg/dL — ABNORMAL HIGH (ref 65–99)
Glucose-Capillary: 101 mg/dL — ABNORMAL HIGH (ref 65–99)
Glucose-Capillary: 100 mg/dL — ABNORMAL HIGH (ref 65–99)
Glucose-Capillary: 91 mg/dL (ref 65–99)
Glucose-Capillary: 98 mg/dL (ref 65–99)
Glucose-Capillary: 102 mg/dL — ABNORMAL HIGH (ref 65–99)
Glucose-Capillary: 100 mg/dL — ABNORMAL HIGH (ref 65–99)
Glucose-Capillary: 116 mg/dL — ABNORMAL HIGH (ref 65–99)
Glucose-Capillary: 96 mg/dL (ref 65–99)
Glucose-Capillary: 127 mg/dL — ABNORMAL HIGH (ref 65–99)
Glucose-Capillary: 158 mg/dL — ABNORMAL HIGH (ref 65–99)
Glucose-Capillary: 154 mg/dL — ABNORMAL HIGH (ref 65–99)
Glucose-Capillary: 273 mg/dL — ABNORMAL HIGH (ref 65–99)

## 2017-04-13 LAB — POCT I-STAT, CHEM 8
BUN: 16 mg/dL (ref 6–20)
Calcium, Ion: 1.14 mmol/L — ABNORMAL LOW (ref 1.15–1.40)
Chloride: 102 mmol/L (ref 101–111)
Creatinine, Ser: 1.1 mg/dL — ABNORMAL HIGH (ref 0.44–1.00)
Glucose, Bld: 233 mg/dL — ABNORMAL HIGH (ref 65–99)
HCT: 22 % — ABNORMAL LOW (ref 36.0–46.0)
Hemoglobin: 7.5 g/dL — ABNORMAL LOW (ref 12.0–15.0)
Potassium: 5.2 mmol/L — ABNORMAL HIGH (ref 3.5–5.1)
Sodium: 134 mmol/L — ABNORMAL LOW (ref 135–145)
TCO2: 21 mmol/L (ref 0–100)

## 2017-04-13 LAB — BASIC METABOLIC PANEL
Anion gap: 7 (ref 5–15)
BUN: 11 mg/dL (ref 6–20)
CO2: 20 mmol/L — ABNORMAL LOW (ref 22–32)
Calcium: 7.6 mg/dL — ABNORMAL LOW (ref 8.9–10.3)
Chloride: 109 mmol/L (ref 101–111)
Creatinine, Ser: 0.96 mg/dL (ref 0.44–1.00)
GFR calc Af Amer: >60 mL/min (ref >60)
GFR calc non Af Amer: >60 mL/min (ref >60)
Glucose, Bld: 103 mg/dL — ABNORMAL HIGH (ref 65–99)
Potassium: 4.6 mmol/L (ref 3.5–5.1)
Sodium: 136 mmol/L (ref 135–145)

## 2017-04-13 LAB — BPAM FFP
Blood Product Expiration Date: 201805102359
Blood Product Expiration Date: 201805102359
ISSUE DATE / TIME: 201805101307
ISSUE DATE / TIME: 201805101307
Unit Type and Rh: 600
Unit Type and Rh: 6200

## 2017-04-13 LAB — MAGNESIUM
Magnesium: 2.6 mg/dL — ABNORMAL HIGH (ref 1.7–2.4)
Magnesium: 2.4 mg/dL (ref 1.7–2.4)

## 2017-04-13 LAB — CREATININE, SERUM
Creatinine, Ser: 1.22 mg/dL — ABNORMAL HIGH (ref 0.44–1.00)
GFR calc Af Amer: 54 mL/min — ABNORMAL LOW (ref >60)
GFR calc non Af Amer: 47 mL/min — ABNORMAL LOW (ref >60)

## 2017-04-13 MED ORDER — FUROSEMIDE 10 MG/ML IJ SOLN
8.0000 mg/h | INTRAVENOUS | Status: AC
  Administered 2017-04-13 – 2017-04-14 (×2): 8 mg/h via INTRAVENOUS
  Filled 2017-04-13 (×3): qty 25

## 2017-04-13 MED ORDER — METOCLOPRAMIDE HCL 5 MG/ML IJ SOLN
10.0000 mg | Freq: Four times a day (QID) | INTRAMUSCULAR | Status: DC
  Administered 2017-04-13 – 2017-04-15 (×11): 10 mg via INTRAVENOUS
  Filled 2017-04-13 (×14): qty 2

## 2017-04-13 MED ORDER — INSULIN DETEMIR 100 UNIT/ML ~~LOC~~ SOLN
14.0000 [IU] | Freq: Two times a day (BID) | SUBCUTANEOUS | Status: DC
  Administered 2017-04-13: 14 [IU] via SUBCUTANEOUS
  Filled 2017-04-13 (×2): qty 0.14

## 2017-04-13 MED ORDER — INSULIN DETEMIR 100 UNIT/ML ~~LOC~~ SOLN
18.0000 [IU] | Freq: Two times a day (BID) | SUBCUTANEOUS | Status: DC
  Administered 2017-04-13 – 2017-04-14 (×2): 18 [IU] via SUBCUTANEOUS
  Filled 2017-04-13 (×2): qty 0.18

## 2017-04-13 MED ORDER — VANCOMYCIN HCL IN DEXTROSE 1-5 GM/200ML-% IV SOLN
1000.0000 mg | Freq: Once | INTRAVENOUS | Status: AC
  Administered 2017-04-13: 1000 mg via INTRAVENOUS
  Filled 2017-04-13: qty 200

## 2017-04-13 MED ORDER — INSULIN ASPART 100 UNIT/ML ~~LOC~~ SOLN
0.0000 [IU] | SUBCUTANEOUS | Status: DC
  Administered 2017-04-13: 8 [IU] via SUBCUTANEOUS
  Administered 2017-04-13: 12 [IU] via SUBCUTANEOUS
  Administered 2017-04-13: 2 [IU] via SUBCUTANEOUS
  Administered 2017-04-14: 4 [IU] via SUBCUTANEOUS
  Administered 2017-04-14: 8 [IU] via SUBCUTANEOUS
  Administered 2017-04-14: 4 [IU] via SUBCUTANEOUS

## 2017-04-13 NOTE — Anesthesia Postprocedure Evaluation (Addendum)
Anesthesia Post Note  Patient: Rachael Davis  Procedure(s) Performed: Procedure(s) (LRB): CORONARY ARTERY BYPASS GRAFTING (CABG), ON PUMP, TIMES FOUR, USING LEFT INTERNAL MAMMARY ARTERY AND ENDOSCOPICALLY HARVESTED RIGHT GREATER SAPHENOUS VEIN (N/A) MITRAL VALVE REPLACEMENT (MVR) (N/A) TRANSESOPHAGEAL ECHOCARDIOGRAM (TEE) (N/A)  Patient location during evaluation: SICU Anesthesia Type: General Level of consciousness: sedated and patient remains intubated per anesthesia plan Pain management: pain level controlled Vital Signs Assessment: post-procedure vital signs reviewed and stable Respiratory status: patient remains intubated per anesthesia plan Cardiovascular status: stable Anesthetic complications: no       Last Vitals:  Vitals:   04/13/17 0700 04/13/17 0800  BP: (!) 89/57 98/64  Pulse: 89 89  Resp: (!) 34 (!) 22  Temp: 36.7 C 36.7 C    Last Pain:  Vitals:   04/13/17 0850  TempSrc:   PainSc: Asleep                 Teresina Bugaj,JAMES TERRILL

## 2017-04-13 NOTE — Anesthesia Procedure Notes (Signed)
Anesthesia Procedure Note Procedures: Right IJ Theone Murdoch Catheter Insertion:[id number]: The patient was identified and consent obtained.  TO was performed, and full barrier precautions were used.  The skin was anesthetized with lidocaine-4cc plain with 25g needle.  Once the vein was located with the 22 ga. needle using ultrasound guidance , the wire was inserted into the vein.  The wire location was confirmed with ultrasound.  The tissue was dilated and the 8.5 [place] cordis catheter was carefully inserted. Afterwards Theone Murdoch catheter was inserted. PA catheter at 45cm.  The patient tolerated the procedure well.

## 2017-04-13 NOTE — Progress Notes (Signed)
DAILY PROGRESS NOTE   Patient Name: Rachael Davis Date of Encounter: 04/13/2017  Hospital Problem List   Principal Problem:   STEMI involving left circumflex coronary artery [facility], [place]) Active Problems:   Coronary artery disease involving native heart without angina pectoris   Essential hypertension   Diabetes mellitus with nephropathy ([place])   S/P CABG x 4    Chief Complaint   Mild sternal pain  Subjective   Doing well post-op. Still requiring pressors - plan to remove swan today. Volume up - going on to a lasix gtts. Glucose well-controlled today.  Objective   Vitals:   04/13/17 0500 04/13/17 0600 04/13/17 0700 04/13/17 0800  BP: 100/61 106/64 (!) 89/57 98/64  Pulse: 89 89 89 89  Resp: 20 (!) 22 (!) 34 (!) 22  Temp: 98.2 F (36.8 C) 98.2 F (36.8 C) 98.1 F (36.7 C) 98.1 F (36.7 C)  TempSrc:      SpO2: 97% 95% 97% 97%  Weight:  220 lb 7.4 oz (100 kg)    Height:        Intake/Output Summary (Last 24 hours) at 04/13/17 0858 Last data filed at 04/13/17 0800  Gross per 24 hour  Intake          8106.53 ml  Output             4515 ml  Net          3591.53 ml   Filed Weights   04/11/17 1300 04/12/17 0500 04/13/17 0600  Weight: 203 lb 14.8 oz (92.5 kg) 201 lb 15.1 oz (91.6 kg) 220 lb 7.4 oz (100 kg)    Physical Exam   General appearance: alert and no distress Lungs: diminished breath sounds bilaterally Heart: regular rate and rhythm Extremities: edema 2+ edema, UE/LE Pulses: 2+ and symmetric Neurologic: Mental status: Alert, oriented, thought content appropriate  Inpatient Medications    Scheduled Meds: . acetaminophen  1,000 mg Oral Q6H   Or  . acetaminophen (TYLENOL) oral liquid 160 mg/5 mL  1,000 mg Per Tube Q6H  . aspirin EC  325 mg Oral Daily   Or  . aspirin  324 mg Per Tube Daily  . atorvastatin  80 mg Oral q1800  . bisacodyl  10 mg Oral Daily   Or  . bisacodyl  10 mg Rectal Daily  . docusate sodium  200 mg Oral Daily  . insulin aspart  0-24  Units Subcutaneous Q4H  . insulin detemir  14 Units Subcutaneous BID  . mouth rinse  15 mL Mouth Rinse BID  . metoCLOPramide (REGLAN) injection  10 mg Intravenous Q6H  . metoprolol tartrate  12.5 mg Oral BID   Or  . metoprolol tartrate  12.5 mg Per Tube BID  . [START ON 04/14/2017] pantoprazole  40 mg Oral Daily  . sodium chloride flush  3 mL Intravenous Q12H    Continuous Infusions: . sodium chloride 20 mL/hr at 04/12/17 1900  . sodium chloride    . sodium chloride Stopped (04/12/17 2300)  . albumin human    . cefUROXime (ZINACEF)  IV Stopped (04/13/17 4696)  . dexmedetomidine (PRECEDEX) IV infusion Stopped (04/12/17 2119)  . DOPamine 2 mcg/kg/min (04/12/17 1900)  . furosemide (LASIX) infusion    . insulin (NOVOLIN-R) infusion 1.2 Units/hr (04/13/17 0700)  . lactated ringers 20 mL/hr at 04/12/17 1900  . lactated ringers Stopped (04/13/17 0500)  . lidocaine 1 mg/min (04/12/17 1900)  . milrinone 0.25 mcg/kg/min (04/12/17 1900)  . phenylephrine '20mg'$ /277m NS (  0.'08mg'$ /ml) infusion 30 mcg/min (04/13/17 0700)  . vancomycin      PRN Meds: sodium chloride, albumin human, metoprolol, midazolam, morphine injection, ondansetron (ZOFRAN) IV, oxyCODONE, sodium chloride flush, traMADol   Labs   Results for orders placed or performed during the hospital encounter of 04/04/17 (from the past 48 hour(s))  Type and screen [place]     Status: None (Preliminary result)   Collection Time: 04/11/17  9:24 AM  Result Value Ref Range   ABO/RH(D) A POS    Antibody Screen NEG    Sample Expiration 04/14/2017    Unit Number [id number]    Blood Component Type RED CELLS,LR    Unit division 00    Status of Unit ISSUED,FINAL    Transfusion Status OK TO TRANSFUSE    Crossmatch Result Compatible    Unit Number [id number]    Blood Component Type RED CELLS,LR    Unit division 00    Status of Unit ISSUED,FINAL    Transfusion Status OK TO TRANSFUSE    Crossmatch Result  Compatible    Unit Number [id number]    Blood Component Type RED CELLS,LR    Unit division 00    Status of Unit ALLOCATED    Transfusion Status OK TO TRANSFUSE    Crossmatch Result Compatible    Unit Number [id number]    Blood Component Type RED CELLS,LR    Unit division 00    Status of Unit ALLOCATED    Transfusion Status OK TO TRANSFUSE    Crossmatch Result Compatible   Prepare RBC (crossmatch)     Status: None   Collection Time: 04/11/17  9:24 AM  Result Value Ref Range   Order Confirmation ORDER PROCESSED BY BLOOD BANK   ABO/Rh     Status: None   Collection Time: 04/11/17  9:24 AM  Result Value Ref Range   ABO/RH(D) A POS   Glucose, capillary     Status: Abnormal   Collection Time: 04/11/17 11:50 AM  Result Value Ref Range   Glucose-Capillary 128 (H) 65 - 99 mg/dL   Comment 1 Capillary Specimen   Glucose, capillary     Status: Abnormal   Collection Time: 04/11/17  4:00 PM  Result Value Ref Range   Glucose-Capillary 132 (H) 65 - 99 mg/dL   Comment 1 Capillary Specimen   CBC     Status: Abnormal   Collection Time: 04/12/17  2:26 AM  Result Value Ref Range   WBC 10.9 (H) 4.0 - 10.5 K/uL    Comment: WHITE COUNT CONFIRMED ON SMEAR   RBC 4.79 3.87 - 5.11 MIL/uL   Hemoglobin 11.4 (L) 12.0 - 15.0 g/dL   HCT 35.3 (L) 36.0 - 46.0 %   MCV 73.7 (L) 78.0 - 100.0 fL   MCH 23.8 (L) 26.0 - 34.0 pg   MCHC 32.3 30.0 - 36.0 g/dL   RDW 15.9 (H) 11.5 - 15.5 %   Platelets 175 150 - 400 K/uL    Comment: PLATELET COUNT CONFIRMED BY SMEAR  Basic metabolic panel     Status: Abnormal   Collection Time: 04/12/17  2:26 AM  Result Value Ref Range   Sodium 136 135 - 145 mmol/L   Potassium 4.8 3.5 - 5.1 mmol/L   Chloride 109 101 - 111 mmol/L   CO2 19 (L) 22 - 32 mmol/L   Glucose, Bld 134 (H) 65 - 99 mg/dL   BUN 15 6 - 20 mg/dL   Creatinine, Ser 1.20 (H) 0.44 -  1.00 mg/dL   Calcium 9.1 8.9 - 10.3 mg/dL   GFR calc non Af Amer 48 (L) >60 mL/min   GFR calc Af Amer 55 (L) >60 mL/min     Comment: (NOTE) The eGFR has been calculated using the CKD EPI equation. This calculation has not been validated in all clinical situations. eGFR's persistently <60 mL/min signify possible Chronic Kidney Disease.    Anion gap 8 5 - 15  Heparin level (unfractionated)     Status: Abnormal   Collection Time: 04/12/17  2:26 AM  Result Value Ref Range   Heparin Unfractionated 0.28 (L) 0.30 - 0.70 IU/mL    Comment:        IF HEPARIN RESULTS ARE BELOW EXPECTED VALUES, AND PATIENT DOSAGE HAS BEEN CONFIRMED, SUGGEST FOLLOW UP TESTING OF ANTITHROMBIN III LEVELS.   Protime-INR     Status: Abnormal   Collection Time: 04/12/17  2:26 AM  Result Value Ref Range   Prothrombin Time 15.6 (H) 11.4 - 15.2 seconds   INR 1.23   APTT     Status: Abnormal   Collection Time: 04/12/17  2:26 AM  Result Value Ref Range   aPTT 58 (H) 24 - 36 seconds    Comment:        IF BASELINE aPTT IS ELEVATED, SUGGEST PATIENT RISK ASSESSMENT BE USED TO DETERMINE APPROPRIATE ANTICOAGULANT THERAPY.   I-STAT, chem 8     Status: Abnormal   Collection Time: 04/12/17  8:00 AM  Result Value Ref Range   Sodium 137 135 - 145 mmol/L   Potassium 4.7 3.5 - 5.1 mmol/L   Chloride 110 101 - 111 mmol/L   BUN 12 6 - 20 mg/dL   Creatinine, Ser 0.90 0.44 - 1.00 mg/dL   Glucose, Bld 95 65 - 99 mg/dL   Calcium, Ion 1.28 1.15 - 1.40 mmol/L   TCO2 23 0 - 100 mmol/L   Hemoglobin 10.2 (L) 12.0 - 15.0 g/dL   HCT 30.0 (L) 36.0 - 46.0 %  I-STAT 3, arterial blood gas (G3+)     Status: Abnormal   Collection Time: 04/12/17  8:05 AM  Result Value Ref Range   pH, Arterial 7.375 7.350 - 7.450   pCO2 arterial 32.9 32.0 - 48.0 mmHg   pO2, Arterial 284.0 (H) 83.0 - 108.0 mmHg   Bicarbonate 19.3 (L) 20.0 - 28.0 mmol/L   TCO2 20 0 - 100 mmol/L   O2 Saturation 100.0 %   Acid-base deficit 5.0 (H) 0.0 - 2.0 mmol/L   Patient temperature HIDE    Sample type ARTERIAL   I-STAT, chem 8     Status: Abnormal   Collection Time: 04/12/17 10:25 AM    Result Value Ref Range   Sodium 138 135 - 145 mmol/L   Potassium 4.7 3.5 - 5.1 mmol/L   Chloride 107 101 - 111 mmol/L   BUN 11 6 - 20 mg/dL   Creatinine, Ser 0.70 0.44 - 1.00 mg/dL   Glucose, Bld 136 (H) 65 - 99 mg/dL   Calcium, Ion 1.29 1.15 - 1.40 mmol/L   TCO2 23 0 - 100 mmol/L   Hemoglobin 10.2 (L) 12.0 - 15.0 g/dL   HCT 30.0 (L) 36.0 - 46.0 %  I-STAT, chem 8     Status: Abnormal   Collection Time: 04/12/17 10:34 AM  Result Value Ref Range   Sodium 137 135 - 145 mmol/L   Potassium 4.9 3.5 - 5.1 mmol/L   Chloride 106 101 - 111 mmol/L   BUN  9 6 - 20 mg/dL   Creatinine, Ser 0.50 0.44 - 1.00 mg/dL   Glucose, Bld 122 (H) 65 - 99 mg/dL   Calcium, Ion 1.03 (L) 1.15 - 1.40 mmol/L   TCO2 21 0 - 100 mmol/L   Hemoglobin 7.8 (L) 12.0 - 15.0 g/dL   HCT 23.0 (L) 36.0 - 46.0 %  Prepare RBC (crossmatch)     Status: None   Collection Time: 04/12/17 10:35 AM  Result Value Ref Range   Order Confirmation ORDER PROCESSED BY BLOOD BANK   I-STAT 3, arterial blood gas (G3+)     Status: Abnormal   Collection Time: 04/12/17 10:39 AM  Result Value Ref Range   pH, Arterial 7.395 7.350 - 7.450   pCO2 arterial 29.0 (L) 32.0 - 48.0 mmHg   pO2, Arterial 457.0 (H) 83.0 - 108.0 mmHg   Bicarbonate 17.8 (L) 20.0 - 28.0 mmol/L   TCO2 19 0 - 100 mmol/L   O2 Saturation 100.0 %   Acid-base deficit 6.0 (H) 0.0 - 2.0 mmol/L   Patient temperature HIDE    Sample type ARTERIAL   I-STAT, chem 8     Status: Abnormal   Collection Time: 04/12/17 11:59 AM  Result Value Ref Range   Sodium 136 135 - 145 mmol/L   Potassium 4.8 3.5 - 5.1 mmol/L   Chloride 104 101 - 111 mmol/L   BUN 11 6 - 20 mg/dL   Creatinine, Ser 0.60 0.44 - 1.00 mg/dL   Glucose, Bld 178 (H) 65 - 99 mg/dL   Calcium, Ion 1.14 (L) 1.15 - 1.40 mmol/L   TCO2 26 0 - 100 mmol/L   Hemoglobin 8.5 (L) 12.0 - 15.0 g/dL   HCT 25.0 (L) 36.0 - 46.0 %  I-STAT, chem 8     Status: Abnormal   Collection Time: 04/12/17 12:36 PM  Result Value Ref Range    Sodium 137 135 - 145 mmol/L   Potassium 5.1 3.5 - 5.1 mmol/L   Chloride 106 101 - 111 mmol/L   BUN 10 6 - 20 mg/dL   Creatinine, Ser 0.70 0.44 - 1.00 mg/dL   Glucose, Bld 186 (H) 65 - 99 mg/dL   Calcium, Ion 1.15 1.15 - 1.40 mmol/L   TCO2 26 0 - 100 mmol/L   Hemoglobin 8.5 (L) 12.0 - 15.0 g/dL   HCT 25.0 (L) 36.0 - 46.0 %  Prepare fresh frozen plasma     Status: None   Collection Time: 04/12/17  1:05 PM  Result Value Ref Range   Unit Number [id number]    Blood Component Type THW PLS APHR    Unit division 00    Status of Unit ISSUED,FINAL    Transfusion Status OK TO TRANSFUSE    Unit Number [id number]    Blood Component Type THWPLS APHR1    Unit division 00    Status of Unit ISSUED,FINAL    Transfusion Status OK TO TRANSFUSE   Hemoglobin and hematocrit, blood     Status: Abnormal   Collection Time: 04/12/17  1:37 PM  Result Value Ref Range   Hemoglobin 8.2 (L) 12.0 - 15.0 g/dL    Comment: RESULT CALLED TO, READ BACK BY AND VERIFIED WITH: MARIA PELENCE,RN AT 1351 04/12/17 BY ZBEECH.    HCT 26.0 (L) 36.0 - 46.0 %  Platelet count     Status: None   Collection Time: 04/12/17  1:37 PM  Result Value Ref Range   Platelets 159 150 - 400 K/uL  I-STAT, chem 8  Status: Abnormal   Collection Time: 04/12/17  1:50 PM  Result Value Ref Range   Sodium 137 135 - 145 mmol/L   Potassium 5.5 (H) 3.5 - 5.1 mmol/L   Chloride 108 101 - 111 mmol/L   BUN 10 6 - 20 mg/dL   Creatinine, Ser 0.60 0.44 - 1.00 mg/dL   Glucose, Bld 193 (H) 65 - 99 mg/dL   Calcium, Ion 1.13 (L) 1.15 - 1.40 mmol/L   TCO2 24 0 - 100 mmol/L   Hemoglobin 8.5 (L) 12.0 - 15.0 g/dL   HCT 25.0 (L) 36.0 - 46.0 %  I-STAT, chem 8     Status: Abnormal   Collection Time: 04/12/17  2:51 PM  Result Value Ref Range   Sodium 138 135 - 145 mmol/L   Potassium 5.4 (H) 3.5 - 5.1 mmol/L   Chloride 106 101 - 111 mmol/L   BUN 10 6 - 20 mg/dL   Creatinine, Ser 0.80 0.44 - 1.00 mg/dL   Glucose, Bld 215 (H) 65 - 99 mg/dL    Calcium, Ion 1.11 (L) 1.15 - 1.40 mmol/L   TCO2 21 0 - 100 mmol/L   Hemoglobin 9.5 (L) 12.0 - 15.0 g/dL   HCT 28.0 (L) 36.0 - 46.0 %  I-STAT 3, arterial blood gas (G3+)     Status: Abnormal   Collection Time: 04/12/17  2:56 PM  Result Value Ref Range   pH, Arterial 7.338 (L) 7.350 - 7.450   pCO2 arterial 35.4 32.0 - 48.0 mmHg   pO2, Arterial 111.0 (H) 83.0 - 108.0 mmHg   Bicarbonate 19.0 (L) 20.0 - 28.0 mmol/L   TCO2 20 0 - 100 mmol/L   O2 Saturation 98.0 %   Acid-base deficit 6.0 (H) 0.0 - 2.0 mmol/L   Patient temperature HIDE    Sample type ARTERIAL   CBC     Status: Abnormal   Collection Time: 04/12/17  4:13 PM  Result Value Ref Range   WBC 24.3 (H) 4.0 - 10.5 K/uL   RBC 4.83 3.87 - 5.11 MIL/uL   Hemoglobin 11.9 (L) 12.0 - 15.0 g/dL    Comment: REPEATED TO VERIFY POST TRANSFUSION SPECIMEN    HCT 36.5 36.0 - 46.0 %   MCV 75.6 (L) 78.0 - 100.0 fL   MCH 24.6 (L) 26.0 - 34.0 pg   MCHC 32.6 30.0 - 36.0 g/dL   RDW 16.7 (H) 11.5 - 15.5 %   Platelets 134 (L) 150 - 400 K/uL  Protime-INR     Status: Abnormal   Collection Time: 04/12/17  4:13 PM  Result Value Ref Range   Prothrombin Time 19.2 (H) 11.4 - 15.2 seconds   INR 1.60   APTT     Status: None   Collection Time: 04/12/17  4:13 PM  Result Value Ref Range   aPTT 31 24 - 36 seconds  I-STAT 4, (NA,K, GLUC, HGB,HCT)     Status: Abnormal   Collection Time: 04/12/17  4:19 PM  Result Value Ref Range   Sodium 140 135 - 145 mmol/L   Potassium 4.8 3.5 - 5.1 mmol/L   Glucose, Bld 159 (H) 65 - 99 mg/dL   HCT 34.0 (L) 36.0 - 46.0 %   Hemoglobin 11.6 (L) 12.0 - 15.0 g/dL  I-STAT 3, arterial blood gas (G3+)     Status: Abnormal   Collection Time: 04/12/17  4:43 PM  Result Value Ref Range   pH, Arterial 7.249 (L) 7.350 - 7.450   pCO2 arterial 46.3 32.0 -  48.0 mmHg   pO2, Arterial 70.0 (L) 83.0 - 108.0 mmHg   Bicarbonate 20.2 20.0 - 28.0 mmol/L   TCO2 22 0 - 100 mmol/L   O2 Saturation 90.0 %   Acid-base deficit 7.0 (H) 0.0 - 2.0  mmol/L   Patient temperature 37.2 C    Collection site ARTERIAL LINE    Drawn by Nurse    Sample type ARTERIAL   Glucose, capillary     Status: Abnormal   Collection Time: 04/12/17  5:02 PM  Result Value Ref Range   Glucose-Capillary 146 (H) 65 - 99 mg/dL   Comment 1 Arterial Specimen   Glucose, capillary     Status: Abnormal   Collection Time: 04/12/17  5:55 PM  Result Value Ref Range   Glucose-Capillary 127 (H) 65 - 99 mg/dL   Comment 1 Arterial Specimen   Glucose, capillary     Status: Abnormal   Collection Time: 04/12/17  6:59 PM  Result Value Ref Range   Glucose-Capillary 121 (H) 65 - 99 mg/dL   Comment 1 Arterial Specimen   Glucose, capillary     Status: Abnormal   Collection Time: 04/12/17  8:08 PM  Result Value Ref Range   Glucose-Capillary 107 (H) 65 - 99 mg/dL   Comment 1 Arterial Specimen   Glucose, capillary     Status: None   Collection Time: 04/12/17  9:01 PM  Result Value Ref Range   Glucose-Capillary 93 65 - 99 mg/dL   Comment 1 Arterial Specimen   Glucose, capillary     Status: None   Collection Time: 04/12/17 10:02 PM  Result Value Ref Range   Glucose-Capillary 92 65 - 99 mg/dL   Comment 1 Arterial Specimen   I-STAT, chem 8     Status: Abnormal   Collection Time: 04/12/17 10:05 PM  Result Value Ref Range   Sodium 137 135 - 145 mmol/L   Potassium 4.5 3.5 - 5.1 mmol/L   Chloride 110 101 - 111 mmol/L   BUN 11 6 - 20 mg/dL   Creatinine, Ser 0.90 0.44 - 1.00 mg/dL   Glucose, Bld 98 65 - 99 mg/dL   Calcium, Ion 1.19 1.15 - 1.40 mmol/L   TCO2 22 0 - 100 mmol/L   Hemoglobin 9.5 (L) 12.0 - 15.0 g/dL   HCT 28.0 (L) 36.0 - 46.0 %  CBC     Status: Abnormal   Collection Time: 04/12/17 10:10 PM  Result Value Ref Range   WBC 17.2 (H) 4.0 - 10.5 K/uL   RBC 4.17 3.87 - 5.11 MIL/uL   Hemoglobin 10.3 (L) 12.0 - 15.0 g/dL   HCT 31.1 (L) 36.0 - 46.0 %   MCV 74.6 (L) 78.0 - 100.0 fL   MCH 24.7 (L) 26.0 - 34.0 pg   MCHC 33.1 30.0 - 36.0 g/dL   RDW 16.3 (H) 11.5 -  15.5 %   Platelets 120 (L) 150 - 400 K/uL  Magnesium     Status: Abnormal   Collection Time: 04/12/17 10:10 PM  Result Value Ref Range   Magnesium 3.0 (H) 1.7 - 2.4 mg/dL  Creatinine, serum     Status: None   Collection Time: 04/12/17 10:10 PM  Result Value Ref Range   Creatinine, Ser 0.97 0.44 - 1.00 mg/dL   GFR calc non Af Amer >60 >60 mL/min   GFR calc Af Amer >60 >60 mL/min    Comment: (NOTE) The eGFR has been calculated using the CKD EPI equation. This calculation has not  been validated in all clinical situations. eGFR's persistently <60 mL/min signify possible Chronic Kidney Disease.   I-STAT 3, arterial blood gas (G3+)     Status: Abnormal   Collection Time: 04/12/17 10:28 PM  Result Value Ref Range   pH, Arterial 7.354 7.350 - 7.450   pCO2 arterial 37.1 32.0 - 48.0 mmHg   pO2, Arterial 77.0 (L) 83.0 - 108.0 mmHg   Bicarbonate 20.6 20.0 - 28.0 mmol/L   TCO2 22 0 - 100 mmol/L   O2 Saturation 95.0 %   Acid-base deficit 4.0 (H) 0.0 - 2.0 mmol/L   Patient temperature 37.0 C    Sample type ARTERIAL   Glucose, capillary     Status: Abnormal   Collection Time: 04/12/17 11:05 PM  Result Value Ref Range   Glucose-Capillary 133 (H) 65 - 99 mg/dL   Comment 1 Arterial Specimen   Glucose, capillary     Status: Abnormal   Collection Time: 04/12/17 11:48 PM  Result Value Ref Range   Glucose-Capillary 123 (H) 65 - 99 mg/dL   Comment 1 Arterial Specimen   I-STAT 3, arterial blood gas (G3+)     Status: Abnormal   Collection Time: 04/12/17 11:51 PM  Result Value Ref Range   pH, Arterial 7.376 7.350 - 7.450   pCO2 arterial 34.1 32.0 - 48.0 mmHg   pO2, Arterial 79.0 (L) 83.0 - 108.0 mmHg   Bicarbonate 20.2 20.0 - 28.0 mmol/L   TCO2 21 0 - 100 mmol/L   O2 Saturation 96.0 %   Acid-base deficit 5.0 (H) 0.0 - 2.0 mmol/L   Patient temperature 97.0 F    Sample type ARTERIAL   Glucose, capillary     Status: Abnormal   Collection Time: 04/13/17  1:01 AM  Result Value Ref Range    Glucose-Capillary 108 (H) 65 - 99 mg/dL   Comment 1 Arterial Specimen   Glucose, capillary     Status: Abnormal   Collection Time: 04/13/17  2:10 AM  Result Value Ref Range   Glucose-Capillary 101 (H) 65 - 99 mg/dL   Comment 1 Arterial Specimen   Glucose, capillary     Status: Abnormal   Collection Time: 04/13/17  2:58 AM  Result Value Ref Range   Glucose-Capillary 100 (H) 65 - 99 mg/dL   Comment 1 Arterial Specimen   Blood gas, arterial     Status: Abnormal   Collection Time: 04/13/17  3:35 AM  Result Value Ref Range   O2 Content 4.0 L/min   Delivery systems NASAL CANNULA    pH, Arterial 7.366 7.350 - 7.450   pCO2 arterial 36.2 32.0 - 48.0 mmHg   pO2, Arterial 143 (H) 83.0 - 108.0 mmHg   Bicarbonate 20.2 20.0 - 28.0 mmol/L   Acid-base deficit 4.2 (H) 0.0 - 2.0 mmol/L   O2 Saturation 98.8 %   Patient temperature 98.6    Collection site A-LINE    Drawn by [phone]    Sample type ARTERIAL    Allens test (pass/fail) NOT INDICATED (A) PASS  CBC     Status: Abnormal   Collection Time: 04/13/17  3:45 AM  Result Value Ref Range   WBC 16.1 (H) 4.0 - 10.5 K/uL   RBC 4.11 3.87 - 5.11 MIL/uL   Hemoglobin 10.0 (L) 12.0 - 15.0 g/dL   HCT 76.1 (L) 95.0 - 93.2 %   MCV 74.5 (L) 78.0 - 100.0 fL   MCH 24.3 (L) 26.0 - 34.0 pg   MCHC 32.7 30.0 - 36.0  g/dL   RDW 15.9 (H) 11.5 - 15.5 %   Platelets 132 (L) 150 - 400 K/uL  Basic metabolic panel     Status: Abnormal   Collection Time: 04/13/17  3:45 AM  Result Value Ref Range   Sodium 136 135 - 145 mmol/L   Potassium 4.6 3.5 - 5.1 mmol/L   Chloride 109 101 - 111 mmol/L   CO2 20 (L) 22 - 32 mmol/L   Glucose, Bld 103 (H) 65 - 99 mg/dL   BUN 11 6 - 20 mg/dL   Creatinine, Ser 0.96 0.44 - 1.00 mg/dL   Calcium 7.6 (L) 8.9 - 10.3 mg/dL   GFR calc non Af Amer >60 >60 mL/min   GFR calc Af Amer >60 >60 mL/min    Comment: (NOTE) The eGFR has been calculated using the CKD EPI equation. This calculation has not been validated in all clinical  situations. eGFR's persistently <60 mL/min signify possible Chronic Kidney Disease.    Anion gap 7 5 - 15  Magnesium     Status: Abnormal   Collection Time: 04/13/17  3:45 AM  Result Value Ref Range   Magnesium 2.6 (H) 1.7 - 2.4 mg/dL  Glucose, capillary     Status: None   Collection Time: 04/13/17  4:00 AM  Result Value Ref Range   Glucose-Capillary 91 65 - 99 mg/dL   Comment 1 Arterial Specimen   Glucose, capillary     Status: None   Collection Time: 04/13/17  5:06 AM  Result Value Ref Range   Glucose-Capillary 98 65 - 99 mg/dL   Comment 1 Arterial Specimen   Glucose, capillary     Status: Abnormal   Collection Time: 04/13/17  5:58 AM  Result Value Ref Range   Glucose-Capillary 102 (H) 65 - 99 mg/dL   Comment 1 Arterial Specimen   Glucose, capillary     Status: Abnormal   Collection Time: 04/13/17  6:57 AM  Result Value Ref Range   Glucose-Capillary 100 (H) 65 - 99 mg/dL   Comment 1 Arterial Specimen   Glucose, capillary     Status: Abnormal   Collection Time: 04/13/17  8:04 AM  Result Value Ref Range   Glucose-Capillary 116 (H) 65 - 99 mg/dL   Comment 1 Arterial Specimen     ECG   Sinus rhythm with 1st degree AVB at 72 - Personally Reviewed  Telemetry   NSR - Personally Reviewed  Radiology    Dg Chest Port 1 View  Result Date: 04/13/2017 CLINICAL DATA:  Chest tube, post mitral valve replacement EXAM: PORTABLE CHEST 1 VIEW COMPARISON:  04/12/2017 FINDINGS: Interval extubation and removal of NG tube. Swan-Ganz catheter tip remains in the main pulmonary artery. Left chest tube is in place. No pneumothorax. Cardiomegaly is with vascular congestion, mild interstitial edema. Low lung volumes with bibasilar atelectasis. IMPRESSION: Interval extubation. Mild interstitial edema and low lung volumes with bibasilar atelectasis. Electronically Signed   By: Rolm Baptise M.D.   On: 04/13/2017 08:05   Dg Chest Port 1 View  Result Date: 04/12/2017 CLINICAL DATA:  61 year old  female with a history of CABG, with mitral valve repair EXAM: PORTABLE CHEST 1 VIEW COMPARISON:  04/11/2017 FINDINGS: Cardiomediastinal silhouette likely unchanged, with the heart borders obscured. Interval surgical changes of median sternotomy and CABG. Interval surgical changes of valve repair. Interval placement of right IJ sheath, through which Swan-Ganz catheter terminates in the pulmonary artery. Gastric tube projects over the mediastinum, terminating in the left upper quadrant. Interval  placement of endotracheal tube which terminates 2.2 cm above the carina. Midline mediastinal/ pleural drains in place. Large bore left-sided thoracostomy tube. Low lung volumes with interstitial opacities/ interlobular septal thickening. IMPRESSION: Early postoperative changes of median sternotomy and valve repair. Endotracheal tube terminates suitably above the carina. Right IJ sheath through which Swan-Ganz catheter terminates in the pulmonary artery. Large bore left-sided thoracostomy tube with mediastinal/ pleural drains projecting over the mediastinum. Gastric tube terminates in the left upper quadrant. Low lung volumes with background of atelectasis and/ or edema. Electronically Signed   By: Corrie Mckusick D.O.   On: 04/12/2017 16:55    Cardiac Studies   None today  Assessment   1. Principal Problem: 2.   STEMI involving left circumflex coronary artery ([place]) 3. Active Problems: 4.   Coronary artery disease involving native heart without angina pectoris 5.   Essential hypertension 6.   Diabetes mellitus with nephropathy ([place]) 7.   S/P CABG x 4 8.   Plan   1. Recovering from CABG yesterday - volume overloaded, plan for lasix gtts. BG's controlled this am. Mild chest wall pain. Hopeful to wean pressors today. LDL 107 - on high dose lipitor 80 mg. Appreciate [facility]'s excellent care.  Time Spent Directly with Patient:  15 minutes  Length of Stay:  LOS: 9 days   Pixie Casino, MD, [place]  Attending Cardiologist  Direct Dial: [phone]  Fax: [phone]  Website:  [email or web address] [email or web address] Jaydon Avina 04/13/2017, 8:58 AM

## 2017-04-13 NOTE — Progress Notes (Signed)
[facility] BRIEF SICU PROGRESS NOTE  1 Day Post-Op  S/P Procedure(s) (LRB): CORONARY ARTERY BYPASS GRAFTING (CABG), ON PUMP, TIMES FOUR, USING LEFT INTERNAL MAMMARY ARTERY AND ENDOSCOPICALLY HARVESTED RIGHT GREATER SAPHENOUS VEIN (N/A) MITRAL VALVE REPLACEMENT (MVR) (N/A) TRANSESOPHAGEAL ECHOCARDIOGRAM (TEE) (N/A)   Stable day AAI paced w/ stable BP Breathing comfortably w/ O2 sats 95-96% on 4 L/min UOP 30-50 mL/hr  Plan: Continue current plan  Purcell Nails, MD 04/13/2017 4:34 PM

## 2017-04-13 NOTE — Progress Notes (Signed)
1 Day Post-Op Procedure(s) (LRB): CORONARY ARTERY BYPASS GRAFTING (CABG), ON PUMP, TIMES FOUR, USING LEFT INTERNAL MAMMARY ARTERY AND ENDOSCOPICALLY HARVESTED RIGHT GREATER SAPHENOUS VEIN (N/A) MITRAL VALVE REPLACEMENT (MVR) (N/A) TRANSESOPHAGEAL ECHOCARDIOGRAM (TEE) (N/A) Subjective: Extubated, neuro intact CXR wet Objective: Vital signs in last 24 hours: Temp:  [98.1 F (36.7 C)-99 F (37.2 C)] 98.1 F (36.7 C) (05/11 0700) Pulse Rate:  [74-115] 89 (05/11 0700) Cardiac Rhythm: Atrial paced (05/11 0400) Resp:  [12-38] 34 (05/11 0700) BP: (81-139)/(53-76) 89/57 (05/11 0700) SpO2:  [93 %-100 %] 97 % (05/11 0700) Arterial Line BP: (84-135)/(47-72) 119/58 (05/11 0700) FiO2 (%):  [40 %-50 %] 40 % (05/10 2207) Weight:  [220 lb 7.4 oz (100 kg)] 220 lb 7.4 oz (100 kg) (05/11 0600)  Hemodynamic parameters for last 24 hours: PAP: (33-56)/(22-29) 56/26 CO:  [2.9 L/min-4.4 L/min] 4.2 L/min CI:  [1.5 L/min/m2-2.2 L/min/m2] 2.2 L/min/m2  Intake/Output from previous day: 05/10 0701 - 05/11 0700 In: 8106.5 [P.O.:240; I.V.:5524.5; Blood:1062; NG/GT:30; IV Piggyback:1250] Out: 4425 [Urine:2515; Blood:1300; Chest Tube:610] Intake/Output this shift: No intake/output data recorded.  Wound: clean, dry no murmur  Lab Results:  Recent Labs  04/12/17 2210 04/13/17 0345  WBC 17.2* 16.1*  HGB 10.3* 10.0*  HCT 31.1* 30.6*  PLT 120* 132*   BMET:  Recent Labs  04/12/17 0226  04/12/17 2205 04/12/17 2210 04/13/17 0345  NA 136  < > 137  --  136  K 4.8  < > 4.5  --  4.6  CL 109  < > 110  --  109  CO2 19*  --   --   --  20*  GLUCOSE 134*  < > 98  --  103*  BUN 15  < > 11  --  11  CREATININE 1.20*  < > 0.90 0.97 0.96  CALCIUM 9.1  --   --   --  7.6*  < > = values in this interval not displayed.  PT/INR:  Recent Labs  04/12/17 1613  LABPROT 19.2*  INR 1.60   ABG    Component Value Date/Time   PHART 7.366 04/13/2017 0335   HCO3 20.2 04/13/2017 0335   TCO2 21 04/12/2017 2351   ACIDBASEDEF 4.2 (H) 04/13/2017 0335   O2SAT 98.8 04/13/2017 0335   CBG (last 3)   Recent Labs  04/13/17 0506 04/13/17 0558 04/13/17 0657  GLUCAP 98 102* 100*    Assessment/Plan: S/P Procedure(s) (LRB): CORONARY ARTERY BYPASS GRAFTING (CABG), ON PUMP, TIMES FOUR, USING LEFT INTERNAL MAMMARY ARTERY AND ENDOSCOPICALLY HARVESTED RIGHT GREATER SAPHENOUS VEIN (N/A) MITRAL VALVE REPLACEMENT (MVR) (N/A) TRANSESOPHAGEAL ECHOCARDIOGRAM (TEE) (N/A) Mobilize Diuresis Diabetes control lasix drip   LOS: 9 days    Rachael Davis 04/13/2017

## 2017-04-13 NOTE — Addendum Note (Signed)
Addendum  created 04/13/17 1045 by Dorris Singh, MD   Anesthesia Intra Blocks edited, Anesthesia Intra LDAs edited, Child order released for a procedure order, LDA properties accepted, Sign clinical note

## 2017-04-14 ENCOUNTER — Inpatient Hospital Stay (HOSPITAL_COMMUNITY): Payer: Managed Care, Other (non HMO)

## 2017-04-14 LAB — GLUCOSE, CAPILLARY
Glucose-Capillary: 189 mg/dL — ABNORMAL HIGH (ref 65–99)
Glucose-Capillary: 198 mg/dL — ABNORMAL HIGH (ref 65–99)
Glucose-Capillary: 228 mg/dL — ABNORMAL HIGH (ref 65–99)
Glucose-Capillary: 233 mg/dL — ABNORMAL HIGH (ref 65–99)
Glucose-Capillary: 249 mg/dL — ABNORMAL HIGH (ref 65–99)
Glucose-Capillary: 273 mg/dL — ABNORMAL HIGH (ref 65–99)

## 2017-04-14 LAB — BASIC METABOLIC PANEL
Anion gap: 9 (ref 5–15)
BUN: 16 mg/dL (ref 6–20)
CO2: 23 mmol/L (ref 22–32)
Calcium: 7.8 mg/dL — ABNORMAL LOW (ref 8.9–10.3)
Chloride: 101 mmol/L (ref 101–111)
Creatinine, Ser: 1.3 mg/dL — ABNORMAL HIGH (ref 0.44–1.00)
GFR calc Af Amer: 50 mL/min — ABNORMAL LOW (ref >60)
GFR calc non Af Amer: 43 mL/min — ABNORMAL LOW (ref >60)
Glucose, Bld: 219 mg/dL — ABNORMAL HIGH (ref 65–99)
Potassium: 3.9 mmol/L (ref 3.5–5.1)
Sodium: 133 mmol/L — ABNORMAL LOW (ref 135–145)

## 2017-04-14 LAB — CBC
HCT: 29.8 % — ABNORMAL LOW (ref 36.0–46.0)
Hemoglobin: 9.5 g/dL — ABNORMAL LOW (ref 12.0–15.0)
MCH: 23.8 pg — ABNORMAL LOW (ref 26.0–34.0)
MCHC: 31.9 g/dL (ref 30.0–36.0)
MCV: 74.5 fL — ABNORMAL LOW (ref 78.0–100.0)
Platelets: 143 10*3/uL — ABNORMAL LOW (ref 150–400)
RBC: 4 MIL/uL (ref 3.87–5.11)
RDW: 16.4 % — ABNORMAL HIGH (ref 11.5–15.5)
WBC: 20.7 10*3/uL — ABNORMAL HIGH (ref 4.0–10.5)

## 2017-04-14 LAB — COOXEMETRY PANEL
Carboxyhemoglobin: 1.3 % (ref 0.5–1.5)
Methemoglobin: 1.2 % (ref 0.0–1.5)
O2 Saturation: 56.8 %
Total hemoglobin: 7.4 g/dL — ABNORMAL LOW (ref 12.0–16.0)

## 2017-04-14 MED ORDER — INSULIN DETEMIR 100 UNIT/ML ~~LOC~~ SOLN
30.0000 [IU] | Freq: Two times a day (BID) | SUBCUTANEOUS | Status: DC
  Administered 2017-04-14 – 2017-04-15 (×2): 30 [IU] via SUBCUTANEOUS
  Filled 2017-04-14 (×2): qty 0.3

## 2017-04-14 MED ORDER — INSULIN ASPART 100 UNIT/ML ~~LOC~~ SOLN
0.0000 [IU] | Freq: Every day | SUBCUTANEOUS | Status: DC
  Administered 2017-04-14 – 2017-04-16 (×2): 3 [IU] via SUBCUTANEOUS

## 2017-04-14 MED ORDER — SODIUM CHLORIDE 0.9% FLUSH
3.0000 mL | Freq: Two times a day (BID) | INTRAVENOUS | Status: DC
  Administered 2017-04-14: 10 mL via INTRAVENOUS
  Administered 2017-04-15 – 2017-04-20 (×6): 3 mL via INTRAVENOUS

## 2017-04-14 MED ORDER — INSULIN ASPART 100 UNIT/ML ~~LOC~~ SOLN
0.0000 [IU] | Freq: Three times a day (TID) | SUBCUTANEOUS | Status: DC
  Administered 2017-04-14 (×2): 7 [IU] via SUBCUTANEOUS
  Administered 2017-04-15 – 2017-04-17 (×5): 4 [IU] via SUBCUTANEOUS
  Administered 2017-04-17: 7 [IU] via SUBCUTANEOUS
  Administered 2017-04-18: 3 [IU] via SUBCUTANEOUS
  Administered 2017-04-18: 7 [IU] via SUBCUTANEOUS
  Administered 2017-04-19: 4 [IU] via SUBCUTANEOUS
  Administered 2017-04-19: 3 [IU] via SUBCUTANEOUS
  Administered 2017-04-19: 11 [IU] via SUBCUTANEOUS
  Administered 2017-04-20: 7 [IU] via SUBCUTANEOUS
  Administered 2017-04-20: 4 [IU] via SUBCUTANEOUS

## 2017-04-14 MED ORDER — SODIUM CHLORIDE 0.9% FLUSH: 3.0000 mL | INTRAVENOUS | Status: DC | PRN

## 2017-04-14 MED ORDER — MOVING RIGHT ALONG BOOK
Freq: Once | Status: AC
  Administered 2017-04-14: 11:00:00
  Filled 2017-04-14: qty 1

## 2017-04-14 MED ORDER — SODIUM CHLORIDE 0.9 % IV SOLN: 250.0000 mL | INTRAVENOUS | Status: DC | PRN

## 2017-04-14 MED ORDER — ENOXAPARIN SODIUM 30 MG/0.3ML ~~LOC~~ SOLN
30.0000 mg | SUBCUTANEOUS | Status: DC
  Administered 2017-04-14 – 2017-04-17 (×4): 30 mg via SUBCUTANEOUS
  Filled 2017-04-14 (×4): qty 0.3

## 2017-04-14 NOTE — Progress Notes (Signed)
[facility] BRIEF SICU PROGRESS NOTE  2 Days Post-Op  S/P Procedure(s) (LRB): CORONARY ARTERY BYPASS GRAFTING (CABG), ON PUMP, TIMES FOUR, USING LEFT INTERNAL MAMMARY ARTERY AND ENDOSCOPICALLY HARVESTED RIGHT GREATER SAPHENOUS VEIN (N/A) MITRAL VALVE REPLACEMENT (MVR) (N/A) TRANSESOPHAGEAL ECHOCARDIOGRAM (TEE) (N/A)   Stable day NSR off all drips except low dose milrinone and lasix Diuresing well  Plan: Continue current plan  Purcell Nails, MD 04/14/2017 4:40 PM

## 2017-04-14 NOTE — Progress Notes (Addendum)
[address]       [name] [address]             [phone]        CARDIOTHORACIC SURGERY PROGRESS NOTE   R2 Days Post-Op Procedure(s) (LRB): CORONARY ARTERY BYPASS GRAFTING (CABG), ON PUMP, TIMES FOUR, USING LEFT INTERNAL MAMMARY ARTERY AND ENDOSCOPICALLY HARVESTED RIGHT GREATER SAPHENOUS VEIN (N/A) MITRAL VALVE REPLACEMENT (MVR) (N/A) TRANSESOPHAGEAL ECHOCARDIOGRAM (TEE) (N/A)  Subjective: Feels okay.  Some mild soreness in chest and congestion.  Poor appetite but overall doing well  Objective: Vital signs: BP Readings from Last 1 Encounters:  04/14/17 (!) 87/52   Pulse Readings from Last 1 Encounters:  04/14/17 89   Resp Readings from Last 1 Encounters:  04/14/17 18   Temp Readings from Last 1 Encounters:  04/14/17 98.1 F (36.7 C) (Oral)    Hemodynamics:    Physical Exam:  Rhythm:   sinus  Breath sounds: Fairly clear  Heart sounds:  RRR  Incisions:  Dressings dry, intact  Abdomen:  Soft, non-distended, non-tender  Extremities:  Warm, well-perfused  Chest tubes:  low volume thin serosanguinous output, no air leak     Intake/Output from previous day: 05/11 0701 - 05/12 0700 In: 2258.4 [P.O.:540; I.V.:1418.4; IV Piggyback:300] Out: 2710 [Urine:2250; Chest Tube:460] Intake/Output this shift: Total I/O In: 106.6 [I.V.:106.6] Out: 0   Lab Results:  CBC: Recent Labs  04/13/17 1630 04/13/17 1711 04/14/17 0400  WBC 21.7*  --  20.7*  HGB 10.5* 7.5* 9.5*  HCT 32.9* 22.0* 29.8*  PLT 132*  --  143*    BMET:  Recent Labs  04/13/17 0345  04/13/17 1711 04/14/17 0400  NA 136  --  134* 133*  K 4.6  --  5.2* 3.9  CL 109  --  102 101  CO2 20*  --   --  23  GLUCOSE 103*  --  233* 219*  BUN 11  --  16 16  CREATININE 0.96  < > 1.10* 1.30*  CALCIUM 7.6*  --   --  7.8*  < > = values in this interval not displayed.   PT/INR:   Recent Labs  04/12/17 1613  LABPROT 19.2*  INR 1.60    CBG (last 3)   Recent Labs   04/14/17 0002 04/14/17 0320 04/14/17 0756  GLUCAP 249* 198* 189*    ABG    Component Value Date/Time   PHART 7.366 04/13/2017 0335   PCO2ART 36.2 04/13/2017 0335   PO2ART 143 (H) 04/13/2017 0335   HCO3 20.2 04/13/2017 0335   TCO2 21 04/13/2017 1711   ACIDBASEDEF 4.2 (H) 04/13/2017 0335   O2SAT 98.8 04/13/2017 0335    CXR: PORTABLE CHEST 1 VIEW  COMPARISON:  Apr 13, 2017  FINDINGS: The PA catheter has been removed, leaving the right IJ sheath. Mediastinal drain and left chest tube are stable. No pneumothorax. Persistent opacity in the left base. No other interval change.  IMPRESSION: Support apparatus as above. No pneumothorax. Persistent, stable opacity in the left base with a probable small associated effusion.   Electronically Signed   By: Gerome Sam III M.D   On: 04/14/2017 08:14  Assessment/Plan: S/P Procedure(s) (LRB): CORONARY ARTERY BYPASS GRAFTING (CABG), ON PUMP, TIMES FOUR, USING LEFT INTERNAL MAMMARY ARTERY AND ENDOSCOPICALLY HARVESTED RIGHT GREATER SAPHENOUS VEIN (N/A) MITRAL VALVE REPLACEMENT (MVR) (N/A) TRANSESOPHAGEAL ECHOCARDIOGRAM (TEE) (N/A)  Overall stable POD2 Maintaining NSR w/ stable BP on low dose dopamine, milrinone and lidocaine Breathing comfortably  w/ O2 sats 95% on 4 L/min Expected post op acute blood loss anemia, mild Expected post op atelectasis, mild Acute on chronic combined systolic and diastolic CHF with expected post-op volume excess, weight down but still 20 lbs > preop Elevated serum creatinine, likely due to prerenal azotemia +/- acute kidney injury caused by ATN Leukocytosis w/out fever, likely reactive Type II diabetes mellitus, CBG's trending up   Wean lidocaine off  Wean milrinone and dopamine slowly  Mobilize  Diuresis  Watch renal function, WBC, anemia  Increase levemir and change CBG's and SSI to ac/hs  Purcell Nails, MD 04/14/2017 10:37 AM

## 2017-04-15 ENCOUNTER — Inpatient Hospital Stay (HOSPITAL_COMMUNITY): Payer: Managed Care, Other (non HMO)

## 2017-04-15 LAB — CBC
HCT: 30.7 % — ABNORMAL LOW (ref 36.0–46.0)
Hemoglobin: 10.2 g/dL — ABNORMAL LOW (ref 12.0–15.0)
MCH: 24.7 pg — ABNORMAL LOW (ref 26.0–34.0)
MCHC: 33.2 g/dL (ref 30.0–36.0)
MCV: 74.3 fL — ABNORMAL LOW (ref 78.0–100.0)
Platelets: 152 10*3/uL (ref 150–400)
RBC: 4.13 MIL/uL (ref 3.87–5.11)
RDW: 17 % — ABNORMAL HIGH (ref 11.5–15.5)
WBC: 23.4 10*3/uL — ABNORMAL HIGH (ref 4.0–10.5)

## 2017-04-15 LAB — BASIC METABOLIC PANEL
Anion gap: 11 (ref 5–15)
BUN: 23 mg/dL — ABNORMAL HIGH (ref 6–20)
CO2: 24 mmol/L (ref 22–32)
Calcium: 7.9 mg/dL — ABNORMAL LOW (ref 8.9–10.3)
Chloride: 100 mmol/L — ABNORMAL LOW (ref 101–111)
Creatinine, Ser: 1.47 mg/dL — ABNORMAL HIGH (ref 0.44–1.00)
GFR calc Af Amer: 43 mL/min — ABNORMAL LOW (ref >60)
GFR calc non Af Amer: 37 mL/min — ABNORMAL LOW (ref >60)
Glucose, Bld: 188 mg/dL — ABNORMAL HIGH (ref 65–99)
Potassium: 3.3 mmol/L — ABNORMAL LOW (ref 3.5–5.1)
Sodium: 135 mmol/L (ref 135–145)

## 2017-04-15 LAB — GLUCOSE, CAPILLARY
Glucose-Capillary: 165 mg/dL — ABNORMAL HIGH (ref 65–99)
Glucose-Capillary: 125 mg/dL — ABNORMAL HIGH (ref 65–99)
Glucose-Capillary: 132 mg/dL — ABNORMAL HIGH (ref 65–99)

## 2017-04-15 LAB — URINALYSIS, COMPLETE (UACMP) WITH MICROSCOPIC
Bilirubin Urine: NEGATIVE
Glucose, UA: NEGATIVE mg/dL
Ketones, ur: NEGATIVE mg/dL
Leukocytes, UA: NEGATIVE
Nitrite: NEGATIVE
Protein, ur: NEGATIVE mg/dL
Specific Gravity, Urine: 1.01 (ref 1.005–1.030)
pH: 5 (ref 5.0–8.0)

## 2017-04-15 LAB — TYPE AND SCREEN
ABO/RH(D): A POS
Antibody Screen: NEGATIVE
Unit division: 0
Unit division: 0
Unit division: 0
Unit division: 0

## 2017-04-15 LAB — BPAM RBC
Blood Product Expiration Date: 201805182359
Blood Product Expiration Date: 201805182359
Blood Product Expiration Date: 201806012359
Blood Product Expiration Date: 201806012359
ISSUE DATE / TIME: 201805101043
ISSUE DATE / TIME: 201805101043
Unit Type and Rh: 6200
Unit Type and Rh: 6200
Unit Type and Rh: 6200
Unit Type and Rh: 6200

## 2017-04-15 LAB — COOXEMETRY PANEL
Carboxyhemoglobin: 0.9 % (ref 0.5–1.5)
Methemoglobin: 1.2 % (ref 0.0–1.5)
O2 Saturation: 64.6 %
Total hemoglobin: 10.2 g/dL — ABNORMAL LOW (ref 12.0–16.0)

## 2017-04-15 MED ORDER — INSULIN DETEMIR 100 UNIT/ML ~~LOC~~ SOLN
36.0000 [IU] | Freq: Two times a day (BID) | SUBCUTANEOUS | Status: DC
  Administered 2017-04-15 – 2017-04-16 (×2): 36 [IU] via SUBCUTANEOUS
  Filled 2017-04-15 (×2): qty 0.36

## 2017-04-15 MED ORDER — CANAGLIFLOZIN 100 MG PO TABS
100.0000 mg | ORAL_TABLET | Freq: Every day | ORAL | Status: DC
  Filled 2017-04-15: qty 1

## 2017-04-15 MED ORDER — POTASSIUM CHLORIDE CRYS ER 20 MEQ PO TBCR
20.0000 meq | EXTENDED_RELEASE_TABLET | ORAL | Status: AC
  Administered 2017-04-15 (×3): 20 meq via ORAL
  Filled 2017-04-15 (×2): qty 1

## 2017-04-15 MED ORDER — GLIPIZIDE ER 10 MG PO TB24
10.0000 mg | ORAL_TABLET | Freq: Two times a day (BID) | ORAL | Status: DC
  Administered 2017-04-15 (×2): 10 mg via ORAL
  Filled 2017-04-15 (×3): qty 1

## 2017-04-15 MED ORDER — POTASSIUM CHLORIDE CRYS ER 20 MEQ PO TBCR
20.0000 meq | EXTENDED_RELEASE_TABLET | ORAL | Status: DC | PRN
  Filled 2017-04-15: qty 1

## 2017-04-15 NOTE — Op Note (Signed)
Rachael Davis, Rachael Davis NO.:  [id number]  MEDICAL RECORD NO.:  [id number]  LOCATION:  [id number]                        FACILITY:  [facility]  PHYSICIAN:  Kerin Perna, M.D.  DATE OF BIRTH:  1956/10/18  DATE OF PROCEDURE:  04/10/2017 DATE OF DISCHARGE:                              OPERATIVE REPORT   OPERATIONS: 1. Coronary artery bypass grafting x4 (left internal mammary artery to     left anterior descending, saphenous vein graft to ramus     intermedius, saphenous vein graft to obtuse marginal, saphenous     vein graft to right coronary artery). 2. Endoscopic harvest of right leg greater saphenous vein. 3. Mitral valve replacement with a 25 mm [facility] Ease tissue     valve, serial [id number].  SURGEON:  Kerin Perna, M.D.  ASSISTANT:  Jari Favre, PA-C.  ANESTHESIA:  General.  PREOPERATIVE DIAGNOSIS: 1. Acute ST-elevation myocardial infarction with total occlusion of     the circumflex. 2. Ischemic cardiomyopathy. 3. Mitral valve regurgitation from both ischemic mitral regurgitation     and mild prolapse of anterior leaflet.  CLINICAL NOTE:  The patient is a 61 year old obese, African American female, diabetic, who presented with acute shortness of breath and chest discomfort.  Cardiac enzymes were positive and she underwent cardiac catheterization by Dr. [name].  This demonstrated chronic occlusion of the right coronary, recent occlusion of the circumflex proximally, and moderate to high-grade stenosis of the ramus intermedius and the LAD. Her ejection fraction is 35% and she had mitral regurgitation on the ventriculogram.  This was confirmed with a transthoracic echocardiogram.  The patient had congestive heart failure, requiring diuretics, elevation of her creatinine, and persistent increase in cardiac enzymes with troponin peaking at 35 international units.  She was felt to be a candidate for surgical coronary revascularization and mitral  valve replacement/repair.  After she recovered from an MI and her renal function stabilized, she was felt to be acceptable candidate, but at increased risk for CABG - mitral valve repair/replacement.  I discussed the procedure of CABG and mitral valve repair/replacement with the patient including the indications, benefits, alternatives, and risks. She understood that we would examine the mitral valve function intraoperatively and make the final decision on repair or replacement. If we replaced the valve, we decided to proceed with a tissue valve to avoid lifelong commitment to anticoagulation.  This she understood the location of the surgical incisions, the use of cardiopulmonary bypass and general anesthesia, and the expected postoperative hospital recovery.  We discussed the potential risks to her of coronary bypass grafting and mitral valve replacement including risks of stroke, bleeding, blood transfusion requirement, MI, postoperative arrhythmias, postoperative pulmonary problems including pleural effusion, postoperative infection, and death.  After reviewing these issues, she demonstrated that her understanding and agreed to proceed with surgery under what I felt was an informed consent.  OPERATIVE FINDINGS: 1. Lateral scar and some inferior wall scarring from an acute lateral     MI and an old inferior MI. 2. Moderate-to-severe mitral regurgitation by TEE, both from the     tethered posterior leaflet from her recent MI and from prolapse of     the  A2 segment of the mitral valve. 3. Improved global LV function and successful correction of mitral     valve regurgitation after CABG and mitral valve replacement using a     valve sparing technique.  DESCRIPTION OF PROCEDURE:  The patient was brought to the operating room and placed supine on the operating table.  General anesthesia was induced under invasive hemodynamic monitoring.  A transesophageal echo probe was placed by the  Anesthesia team.  The chest, abdomen, and legs were prepped with Betadine and draped as a sterile field.  A femoral A- line was placed for blood pressure monitoring.  A sternal incision was made as the saphenous vein was harvested endoscopically from the right leg.  The left internal mammary artery was harvested as a pedicle graft from its origin at the subclavian vessels.  There was a 1.2-mm vessel with good flow.  The sternal retractor was placed and the pericardium was opened and suspended.  Pursestrings were placed in ascending aorta and right atrium.  When the vein had been harvested, the patient was fully heparinized and when the ACT was documented as being therapeutic, the patient was cannulated and placed on cardiopulmonary bypass.  A second venous pursestring was placed above the inferior vena cava and a second venous cannula was placed for bicaval drainage.  The interatrial groove was dissected out.  Caval tapes were placed around the SVC and IVC.  The coronaries were identified for grafting.  The LAD, ramus intermedius, OM, and distal RCA were found to be adequate targets.  The mammary artery and vein grafts were prepared for the distal anastomoses. Cardioplegia cannulas were placed both antegrade aortic and retrograde coronary sinus cardioplegia.  The patient was then cooled to 32 degrees and aortic crossclamp was applied.  One liter of cold blood cardioplegia was delivered in split doses between the antegrade aortic and retrograde coronary sinus catheter.  There was good cardioplegic arrest and septal temperature dropped less than 14 degrees.  Cardioplegia was delivered every 20 minutes.  The distal coronary anastomoses were performed.  The first distal anastomosis was the right coronary.  This was a 1.5-mm vessel.  There was a total occlusion proximally.  Reverse saphenous vein was sewn end- to-side with running 7-0 Prolene.  There was good flow through the graft.   Cardioplegia was redosed.  The second distal anastomosis was the obtuse marginal branch of the circumflex.  This had recently occluded.  Myocardium in this area was edematous and friable.  The vessel was 1.5 mm in diameter and a reverse saphenous vein was sewn end-to-side with running 7-0 Prolene with good flow through the graft.  Cardioplegia was redosed.  The third distal anastomosis was the ramus intermediate branch which had a proximal 80% stenosis.  It was intramyocardial.  A reverse saphenous vein was sewn end-to-side with running 7-0 Prolene.  There was good flow through the graft.  Cardioplegia was redosed.  The fourth distal anastomosis was to the distal third of the LAD.  There was a 1.5-mm vessel with proximal 75% stenosis.  The left internal mammary artery was brought through an opening in the pericardium and sewn end-to-side with running 8-0 Prolene.  There was good flow through the anastomosis after briefly releasing the pedicle bulldog on the mammary artery.  The bulldog was reapplied and the pedicle was replaced. Cardioplegia was redosed.  Attention was then directed to the mitral valve.  An incision was made in the left atrium.  The left atrial retractors were placed.  The mitral valve was small.  Visibility and exposure were fair, but adequate.  A saline test was performed.  There was moderate-to-severe regurgitation. There was tethering of the posterior leaflet as well as prolapse of the A2 segment of the anterior leaflet.  In the setting of ischemic cardiomyopathy with reduced LV function, I decided that valve sparing mitral valve replacement would be the best option.  The anterior leaflet was partially excised with preservation of the cords.  The posterior leaflet was left intact.  Supraannular 2-0 Ethibond sutures were placed in a supraannular technique around the annulus, numbering 14 total.  The annulus was sized to a 25 mm valve.  The [facility] Ease valve  was then processed in the usual technique.  The sutures were then placed through the sewing ring of the valve and the valve was seated and all the sutures were tied.  The valve was checked for any potential spaces to allow for perivalvular leak and there was none.  The valve conformed to the annulus well.  A vent was placed across the valve into the left ventricle and the atriotomy incision was closed in 2 layers using running 3-0 Prolene. The vent was left through the 2-suture lines which were left untied in order to help remove the air from the ventricle.  After cardioplegia was again redosed, the atrial retractors were removed and the 3 proximal vein anastomoses were performed on the ascending aorta using a 4.5-mm punch running 6-0 Prolene.  Prior to tying down the final proximal anastomosis, air was vented from the coronaries with a dose of retrograde warm blood cardioplegia.  The crossclamp was then removed.  The heart resumed a spontaneous rhythm.  The vein grafts were de-aired and opened.  There was good flow through each graft and hemostasis was documented at the proximal and distal anastomoses.  The left side of the heart was de-aired using the usual techniques and at that time, the vent was removed from the left ventricle through the new mitral valve.  The atriotomy sutures were tied.  The patient was reperfused and rewarmed.  Temporary pacing wires were placed.  Low-dose milrinone and dopamine were started.  Lungs were expanded.  The ventilator was resumed.  When the patient's reperfusion was adequate and she was in optimal condition, she was weaned from cardiopulmonary bypass without difficulty.  Hemodynamics were stable. Cardiac output was over 4 L.  Echo showed normal functioning of the mitral prosthesis.  There was improved global LV function.  There was no residual air.  Protamine was administered without adverse reaction.  The cannulas were removed.  The mediastinum  was irrigated.  Hemostasis was achieved.  Anterior mediastinal, posterior mediastinal, and left pleural chest tubes were placed and brought out through separate incisions.  The sternum was closed with interrupted steel wire.  The pectoralis fascia was closed with a running #1 Vicryl.  The subcutaneous and skin layers were closed in running Vicryl.  Sterile dressings were applied. Total cardiopulmonary bypass was 220 minutes.     Kerin Perna, M.D.     PV/MEDQ  D:  04/15/2017  T:  04/15/2017  Job:  [id number]

## 2017-04-15 NOTE — Progress Notes (Addendum)
[address]       [name] [address]             [phone]        CARDIOTHORACIC SURGERY PROGRESS NOTE   R3 Days Post-Op Procedure(s) (LRB): CORONARY ARTERY BYPASS GRAFTING (CABG), ON PUMP, TIMES FOUR, USING LEFT INTERNAL MAMMARY ARTERY AND ENDOSCOPICALLY HARVESTED RIGHT GREATER SAPHENOUS VEIN (N/A) MITRAL VALVE REPLACEMENT (MVR) (N/A) TRANSESOPHAGEAL ECHOCARDIOGRAM (TEE) (N/A)  Subjective: Feels better but didn't sleep last night.  Minimal soreness in chest.  Breathing improved but still dyspneic with activity  Objective: Vital signs: BP Readings from Last 1 Encounters:  04/15/17 (!) 139/103   Pulse Readings from Last 1 Encounters:  04/15/17 86   Resp Readings from Last 1 Encounters:  04/15/17 17   Temp Readings from Last 1 Encounters:  04/15/17 98.7 F (37.1 C) (Oral)    Hemodynamics: Mixed venous co-ox 64.6%   Physical Exam:  Rhythm:   Sinus  Breath sounds: clear  Heart sounds:  RRR  Incisions:  Clean and dry  Abdomen:  Soft, non-distended, non-tender  Extremities:  Warm, well-perfused    Intake/Output from previous day: 05/12 0701 - 05/13 0700 In: 1754.8 [P.O.:960; I.V.:794.8] Out: 1715 [Urine:1685; Chest Tube:30] Intake/Output this shift: Total I/O In: 324 [P.O.:240; I.V.:84] Out: 360 [Urine:360]  Lab Results:  CBC: Recent Labs  04/14/17 0400 04/15/17 0500  WBC 20.7* 23.4*  HGB 9.5* 10.2*  HCT 29.8* 30.7*  PLT 143* 152    BMET:  Recent Labs  04/14/17 0400 04/15/17 0500  NA 133* 135  K 3.9 3.3*  CL 101 100*  CO2 23 24  GLUCOSE 219* 188*  BUN 16 23*  CREATININE 1.30* 1.47*  CALCIUM 7.8* 7.9*     PT/INR:   Recent Labs  04/12/17 1613  LABPROT 19.2*  INR 1.60    CBG (last 3)   Recent Labs  04/14/17 1208 04/14/17 1942 04/14/17 2144  GLUCAP 228* 233* 273*    ABG    Component Value Date/Time   PHART 7.366 04/13/2017 0335   PCO2ART 36.2 04/13/2017 0335   PO2ART 143 (H) 04/13/2017 0335   HCO3 20.2 04/13/2017 0335   TCO2 21 04/13/2017 1711   ACIDBASEDEF 4.2 (H) 04/13/2017 0335   O2SAT 64.6 04/15/2017 0515    CXR: CHEST  2 VIEW  COMPARISON:  04/14/2017 chest radiograph  FINDINGS: Stable cardiac silhouette. Stable left basilar and mid lung zone opacity and small left pleural effusion. Stable right central venous catheter with tip projecting over upper SVC. Mitral valve replacement. Sternotomy wires are aligned. No acute osseous abnormality is evident.  IMPRESSION: Stable left mid and lower lung zone opacities and small left pleural effusion.   Electronically Signed   By: Mitzi Hansen M.D.   On: 04/15/2017 04:18  Assessment/Plan: S/P Procedure(s) (LRB): CORONARY ARTERY BYPASS GRAFTING (CABG), ON PUMP, TIMES FOUR, USING LEFT INTERNAL MAMMARY ARTERY AND ENDOSCOPICALLY HARVESTED RIGHT GREATER SAPHENOUS VEIN (N/A) MITRAL VALVE REPLACEMENT (MVR) (N/A) TRANSESOPHAGEAL ECHOCARDIOGRAM (TEE) (N/A)  Overall stable POD3 Maintaining NSR w/ stable BP off all drips Breathing comfortably w/ O2 sats 94% on 3 L/min Expected post op acute blood loss anemia, mild Expected post op atelectasis, mild Acute on chronic combined systolic and diastolic CHF with expected post-op volume excess, weight down but still 14 lbs > preop Elevated serum creatinine, likely due to prerenal azotemia +/- acute kidney injury caused by ATN Leukocytosis w/out fever, likely reactive Type II diabetes mellitus, CBG's trending up  Mobilize  Stop lasix drip but continue diuresis  Watch renal function, anemia  Watch WBC and check UA, urine culture  Increase levemir, restart Glipizide and Farxiga, continue to hold metformin for now  Transfer step down  Purcell Nails, MD 04/15/2017 10:34 AM

## 2017-04-16 LAB — CBC
HCT: 31.2 % — ABNORMAL LOW (ref 36.0–46.0)
Hemoglobin: 10 g/dL — ABNORMAL LOW (ref 12.0–15.0)
MCH: 24.1 pg — ABNORMAL LOW (ref 26.0–34.0)
MCHC: 32.1 g/dL (ref 30.0–36.0)
MCV: 75.2 fL — ABNORMAL LOW (ref 78.0–100.0)
Platelets: 210 10*3/uL (ref 150–400)
RBC: 4.15 MIL/uL (ref 3.87–5.11)
RDW: 17.3 % — ABNORMAL HIGH (ref 11.5–15.5)
WBC: 20.6 10*3/uL — ABNORMAL HIGH (ref 4.0–10.5)

## 2017-04-16 LAB — GLUCOSE, CAPILLARY
Glucose-Capillary: 222 mg/dL — ABNORMAL HIGH (ref 65–99)
Glucose-Capillary: 222 mg/dL — ABNORMAL HIGH (ref 65–99)
Glucose-Capillary: 189 mg/dL — ABNORMAL HIGH (ref 65–99)
Glucose-Capillary: 49 mg/dL — ABNORMAL LOW (ref 65–99)
Glucose-Capillary: 60 mg/dL — ABNORMAL LOW (ref 65–99)
Glucose-Capillary: 164 mg/dL — ABNORMAL HIGH (ref 65–99)
Glucose-Capillary: 192 mg/dL — ABNORMAL HIGH (ref 65–99)
Glucose-Capillary: 237 mg/dL — ABNORMAL HIGH (ref 65–99)

## 2017-04-16 LAB — COMPREHENSIVE METABOLIC PANEL
ALT: 41 U/L (ref 14–54)
AST: 45 U/L — ABNORMAL HIGH (ref 15–41)
Albumin: 2.5 g/dL — ABNORMAL LOW (ref 3.5–5.0)
Alkaline Phosphatase: 95 U/L (ref 38–126)
Anion gap: 10 (ref 5–15)
BUN: 28 mg/dL — ABNORMAL HIGH (ref 6–20)
CO2: 23 mmol/L (ref 22–32)
Calcium: 8 mg/dL — ABNORMAL LOW (ref 8.9–10.3)
Chloride: 103 mmol/L (ref 101–111)
Creatinine, Ser: 1.29 mg/dL — ABNORMAL HIGH (ref 0.44–1.00)
GFR calc Af Amer: 51 mL/min — ABNORMAL LOW (ref >60)
GFR calc non Af Amer: 44 mL/min — ABNORMAL LOW (ref >60)
Glucose, Bld: 253 mg/dL — ABNORMAL HIGH (ref 65–99)
Potassium: 3.6 mmol/L (ref 3.5–5.1)
Sodium: 136 mmol/L (ref 135–145)
Total Bilirubin: 0.6 mg/dL (ref 0.3–1.2)
Total Protein: 5.9 g/dL — ABNORMAL LOW (ref 6.5–8.1)

## 2017-04-16 MED ORDER — WARFARIN SODIUM 2 MG PO TABS
2.0000 mg | ORAL_TABLET | Freq: Once | ORAL | Status: AC
  Administered 2017-04-16: 2 mg via ORAL
  Filled 2017-04-16: qty 1

## 2017-04-16 MED ORDER — INSULIN DETEMIR 100 UNIT/ML ~~LOC~~ SOLN
32.0000 [IU] | Freq: Two times a day (BID) | SUBCUTANEOUS | Status: DC
  Administered 2017-04-16: 32 [IU] via SUBCUTANEOUS
  Filled 2017-04-16 (×2): qty 0.32

## 2017-04-16 MED ORDER — WARFARIN - PHYSICIAN DOSING INPATIENT
Freq: Every day | Status: DC
  Administered 2017-04-18 – 2017-04-20 (×3)

## 2017-04-16 MED ORDER — OXYCODONE HCL 5 MG PO TABS: 5.0000 mg | ORAL_TABLET | ORAL | Status: DC | PRN

## 2017-04-16 MED ORDER — ATORVASTATIN CALCIUM 20 MG PO TABS
20.0000 mg | ORAL_TABLET | Freq: Every day | ORAL | Status: DC
  Administered 2017-04-16 – 2017-04-20 (×5): 20 mg via ORAL
  Filled 2017-04-16 (×5): qty 1

## 2017-04-16 MED ORDER — FUROSEMIDE 40 MG PO TABS
40.0000 mg | ORAL_TABLET | Freq: Every day | ORAL | Status: DC
  Administered 2017-04-16: 40 mg via ORAL
  Filled 2017-04-16: qty 1

## 2017-04-16 MED ORDER — METOLAZONE 5 MG PO TABS
10.0000 mg | ORAL_TABLET | Freq: Every day | ORAL | Status: AC
  Administered 2017-04-16 – 2017-04-18 (×3): 10 mg via ORAL
  Filled 2017-04-16 (×3): qty 2

## 2017-04-16 MED ORDER — GLIPIZIDE ER 10 MG PO TB24
10.0000 mg | ORAL_TABLET | Freq: Two times a day (BID) | ORAL | Status: DC
  Filled 2017-04-16: qty 1

## 2017-04-16 MED ORDER — POTASSIUM CHLORIDE CRYS ER 10 MEQ PO TBCR
40.0000 meq | EXTENDED_RELEASE_TABLET | Freq: Every day | ORAL | Status: DC
  Administered 2017-04-16: 40 meq via ORAL
  Filled 2017-04-16: qty 2

## 2017-04-16 NOTE — Progress Notes (Addendum)
[address]       [place] [address]             [phone]      4 Days Post-Op Procedure(s) (LRB): CORONARY ARTERY BYPASS GRAFTING (CABG), ON PUMP, TIMES FOUR, USING LEFT INTERNAL MAMMARY ARTERY AND ENDOSCOPICALLY HARVESTED RIGHT GREATER SAPHENOUS VEIN (N/A) MITRAL VALVE REPLACEMENT (MVR) (N/A) TRANSESOPHAGEAL ECHOCARDIOGRAM (TEE) (N/A) Subjective: Feels poorly, had a hypoglycemic episode BS 49  Objective: Vital signs in last 24 hours: Temp:  [97.5 F (36.4 C)-98 F (36.7 C)] 98 F (36.7 C) (05/14 0416) Pulse Rate:  [62-92] 85 (05/14 0416) Cardiac Rhythm: Normal sinus rhythm (05/14 0700) Resp:  [17-35] 20 (05/14 0416) BP: (104-139)/(62-127) 124/77 (05/14 0416) SpO2:  [94 %-100 %] 100 % (05/14 0416) Weight:  [216 lb 3.2 oz (98.1 kg)] 216 lb 3.2 oz (98.1 kg) (05/14 0416)  Hemodynamic parameters for last 24 hours:    Intake/Output from previous day: 05/13 0701 - 05/14 0700 In: 825 [P.O.:601; I.V.:224] Out: 1410 [Urine:1410] Intake/Output this shift: No intake/output data recorded.  General appearance: alert, cooperative and no distress Heart: irregularly irregular rhythm Lungs: mildly dim in bases Abdomen: benign Extremities: + BLE edema Wound: incis healing well  Lab Results:  Recent Labs  04/14/17 0400 04/15/17 0500  WBC 20.7* 23.4*  HGB 9.5* 10.2*  HCT 29.8* 30.7*  PLT 143* 152   BMET:  Recent Labs  04/14/17 0400 04/15/17 0500  NA 133* 135  K 3.9 3.3*  CL 101 100*  CO2 23 24  GLUCOSE 219* 188*  BUN 16 23*  CREATININE 1.30* 1.47*  CALCIUM 7.8* 7.9*    PT/INR: No results for input(s): LABPROT, INR in the last 72 hours. ABG    Component Value Date/Time   PHART 7.366 04/13/2017 0335   HCO3 20.2 04/13/2017 0335   TCO2 21 04/13/2017 1711   ACIDBASEDEF 4.2 (H) 04/13/2017 0335   O2SAT 64.6 04/15/2017 0515   CBG (last 3)   Recent Labs  04/15/17 2307 04/16/17 0658 04/16/17 0706  GLUCAP 132* 49* 60*     Meds Scheduled Meds: . acetaminophen  1,000 mg Oral Q6H  . aspirin EC  325 mg Oral Daily  . atorvastatin  80 mg Oral q1800  . bisacodyl  10 mg Oral Daily   Or  . bisacodyl  10 mg Rectal Daily  . canagliflozin  100 mg Oral QAC breakfast  . docusate sodium  200 mg Oral Daily  . enoxaparin (LOVENOX) injection  30 mg Subcutaneous Q24H  . glipiZIDE  10 mg Oral BID WC  . insulin aspart  0-20 Units Subcutaneous TID WC  . insulin aspart  0-5 Units Subcutaneous QHS  . insulin detemir  36 Units Subcutaneous BID  . mouth rinse  15 mL Mouth Rinse BID  . metoCLOPramide (REGLAN) injection  10 mg Intravenous Q6H  . metoprolol tartrate  12.5 mg Oral BID  . pantoprazole  40 mg Oral Daily  . sodium chloride flush  3 mL Intravenous Q12H  . sodium chloride flush  3 mL Intravenous Q12H   Continuous Infusions: . sodium chloride    . sodium chloride    . lactated ringers Stopped (04/15/17 1500)   PRN Meds:.sodium chloride, metoprolol, morphine injection, ondansetron (ZOFRAN) IV, oxyCODONE, sodium chloride flush, sodium chloride flush, traMADol  Xrays Dg Chest 2 View  Result Date: 04/15/2017 CLINICAL DATA:  61 y/o  F; post CABG on 04/12/2017. EXAM: CHEST  2 VIEW COMPARISON:  04/14/2017 chest  radiograph FINDINGS: Stable cardiac silhouette. Stable left basilar and mid lung zone opacity and small left pleural effusion. Stable right central venous catheter with tip projecting over upper SVC. Mitral valve replacement. Sternotomy wires are aligned. No acute osseous abnormality is evident. IMPRESSION: Stable left mid and lower lung zone opacities and small left pleural effusion. Electronically Signed   By: Mitzi Hansen M.D.   On: 04/15/2017 04:18    Assessment/Plan: S/P Procedure(s) (LRB): CORONARY ARTERY BYPASS GRAFTING (CABG), ON PUMP, TIMES FOUR, USING LEFT INTERNAL MAMMARY ARTERY AND ENDOSCOPICALLY HARVESTED RIGHT GREATER SAPHENOUS VEIN (N/A) MITRAL VALVE REPLACEMENT (MVR)  (N/A) TRANSESOPHAGEAL ECHOCARDIOGRAM (TEE) (N/A)  1 doing well overall 2 will hold invokana/glucotrol for now with low sugars, creat increasing 3 recheck lytes, she needs diuretic as well 4 reduce statin dose, she has an excellent lipid profile for what is tested and has some low level increase in LFT's- cmet pending 5 wean O2- pulm toilet 6 rehab-routine    LOS: 12 days    GOLD,[name] 04/16/2017 Start coumadin short term for MVR with nsr Add metolazone to help diurese Hold po hypoglycemic meds patient examined and medical record reviewed,agree with above note. [name] 04/16/2017

## 2017-04-16 NOTE — Progress Notes (Signed)
Hypoglycemic Event  CBG: 49  Treatment: 15 GM carbohydrate snack  Symptoms: None  Follow-up CBG: Time 0707 CBG Result:60  Possible Reasons for Event: medication regimen  Comments/MD notified:n/a    Berton Mount

## 2017-04-16 NOTE — Progress Notes (Signed)
CARDIAC REHAB PHASE I   PRE:  Rate/Rhythm: 97 SR PVCs  BP:  Supine:   Sitting: 122/74  Standing:    SaO2: 100% 2L  MODE:  Ambulation: 50 ft   POST:  Rate/Rhythm: 104 ST PVCs  BP:  Supine:   Sitting: 126/66  Standing:    SaO2: 99%2L 0835-0907 Pt c/o feeling weak today. Assisted to [facility] and asked pt if she wanted to try off oxygen. Pt did not feel that she could walk off oxygen. Pt had to sit in chair at door, then walked 50 ft and had to sit at chair again, prior to going to recliner. Pt used rolling walker and asst x 1. Encouraged pt to walk two more times today and work on IS. Generalized weakness today.   Luetta Nutting, RN BSN  04/16/2017 9:03 AM

## 2017-04-16 NOTE — Progress Notes (Signed)
Patient offered to walk the to the hallway,refused complaints of weakness and shortness of breath on exertion.Will continue to monitor.

## 2017-04-17 ENCOUNTER — Inpatient Hospital Stay (HOSPITAL_COMMUNITY): Payer: Managed Care, Other (non HMO)

## 2017-04-17 LAB — CBC
HCT: 30.3 % — ABNORMAL LOW (ref 36.0–46.0)
Hemoglobin: 9.5 g/dL — ABNORMAL LOW (ref 12.0–15.0)
MCH: 23.8 pg — ABNORMAL LOW (ref 26.0–34.0)
MCHC: 31.4 g/dL (ref 30.0–36.0)
MCV: 75.8 fL — ABNORMAL LOW (ref 78.0–100.0)
Platelets: 229 10*3/uL (ref 150–400)
RBC: 4 MIL/uL (ref 3.87–5.11)
RDW: 17 % — ABNORMAL HIGH (ref 11.5–15.5)
WBC: 14.2 10*3/uL — ABNORMAL HIGH (ref 4.0–10.5)

## 2017-04-17 LAB — BASIC METABOLIC PANEL
Anion gap: 10 (ref 5–15)
BUN: 26 mg/dL — ABNORMAL HIGH (ref 6–20)
CO2: 28 mmol/L (ref 22–32)
Calcium: 8.1 mg/dL — ABNORMAL LOW (ref 8.9–10.3)
Chloride: 103 mmol/L (ref 101–111)
Creatinine, Ser: 1.1 mg/dL — ABNORMAL HIGH (ref 0.44–1.00)
GFR calc Af Amer: >60 mL/min (ref >60)
GFR calc non Af Amer: 53 mL/min — ABNORMAL LOW (ref >60)
Glucose, Bld: 124 mg/dL — ABNORMAL HIGH (ref 65–99)
Potassium: 3.3 mmol/L — ABNORMAL LOW (ref 3.5–5.1)
Sodium: 141 mmol/L (ref 135–145)

## 2017-04-17 LAB — URINE CULTURE: Culture: 80000 — AB

## 2017-04-17 LAB — GLUCOSE, CAPILLARY
Glucose-Capillary: 55 mg/dL — ABNORMAL LOW (ref 65–99)
Glucose-Capillary: 102 mg/dL — ABNORMAL HIGH (ref 65–99)
Glucose-Capillary: 205 mg/dL — ABNORMAL HIGH (ref 65–99)
Glucose-Capillary: 176 mg/dL — ABNORMAL HIGH (ref 65–99)
Glucose-Capillary: 283 mg/dL — ABNORMAL HIGH (ref 65–99)

## 2017-04-17 LAB — PROTIME-INR
INR: 1.45
Prothrombin Time: 17.7 seconds — ABNORMAL HIGH (ref 11.4–15.2)

## 2017-04-17 MED ORDER — INSULIN DETEMIR 100 UNIT/ML ~~LOC~~ SOLN
30.0000 [IU] | Freq: Two times a day (BID) | SUBCUTANEOUS | Status: DC
  Administered 2017-04-17 – 2017-04-21 (×9): 30 [IU] via SUBCUTANEOUS
  Filled 2017-04-17 (×9): qty 0.3

## 2017-04-17 MED ORDER — WARFARIN SODIUM 2 MG PO TABS
2.0000 mg | ORAL_TABLET | Freq: Every day | ORAL | Status: AC
  Administered 2017-04-17: 2 mg via ORAL
  Filled 2017-04-17: qty 1

## 2017-04-17 MED ORDER — POTASSIUM CHLORIDE CRYS ER 20 MEQ PO TBCR
20.0000 meq | EXTENDED_RELEASE_TABLET | Freq: Two times a day (BID) | ORAL | Status: DC
  Administered 2017-04-17 – 2017-04-21 (×9): 20 meq via ORAL
  Filled 2017-04-17 (×9): qty 1

## 2017-04-17 MED ORDER — SPIRONOLACTONE 25 MG PO TABS
25.0000 mg | ORAL_TABLET | Freq: Every day | ORAL | Status: AC
  Administered 2017-04-17 – 2017-04-19 (×3): 25 mg via ORAL
  Filled 2017-04-17 (×3): qty 1

## 2017-04-17 NOTE — Progress Notes (Addendum)
[address]       [place] [address]             [phone]      5 Days Post-Op Procedure(s) (LRB): CORONARY ARTERY BYPASS GRAFTING (CABG), ON PUMP, TIMES FOUR, USING LEFT INTERNAL MAMMARY ARTERY AND ENDOSCOPICALLY HARVESTED RIGHT GREATER SAPHENOUS VEIN (N/A) MITRAL VALVE REPLACEMENT (MVR) (N/A) TRANSESOPHAGEAL ECHOCARDIOGRAM (TEE) (N/A) Subjective: Feels a bit better today overall, + DOE  Objective: Vital signs in last 24 hours: Temp:  [97.5 F (36.4 C)-98 F (36.7 C)] 98 F (36.7 C) (05/15 0443) Pulse Rate:  [90-94] 91 (05/15 0443) Cardiac Rhythm: Normal sinus rhythm (05/14 1920) Resp:  [18-20] 18 (05/15 0443) BP: (118-138)/(74-79) 138/79 (05/15 0443) SpO2:  [100 %] 100 % (05/15 0443) Weight:  [215 lb 6.4 oz (97.7 kg)] 215 lb 6.4 oz (97.7 kg) (05/15 0443)  Hemodynamic parameters for last 24 hours:    Intake/Output from previous day: 05/14 0701 - 05/15 0700 In: 340 [P.O.:340] Out: 2200 [Urine:2200] Intake/Output this shift: No intake/output data recorded.  General appearance: alert, cooperative and no distress Heart: regular rate and rhythm Lungs: dim left base> right base Abdomen: benign Extremities: + LE edema Wound: incis healing well  Lab Results:  Recent Labs  04/16/17 0924 04/17/17 0238  WBC 20.6* 14.2*  HGB 10.0* 9.5*  HCT 31.2* 30.3*  PLT 210 229   BMET:  Recent Labs  04/16/17 0924 04/17/17 0238  NA 136 141  K 3.6 3.3*  CL 103 103  CO2 23 28  GLUCOSE 253* 124*  BUN 28* 26*  CREATININE 1.29* 1.10*  CALCIUM 8.0* 8.1*    PT/INR:  Recent Labs  04/17/17 0238  LABPROT 17.7*  INR 1.45   ABG    Component Value Date/Time   PHART 7.366 04/13/2017 0335   HCO3 20.2 04/13/2017 0335   TCO2 21 04/13/2017 1711   ACIDBASEDEF 4.2 (H) 04/13/2017 0335   O2SAT 64.6 04/15/2017 0515   CBG (last 3)   Recent Labs  04/16/17 2100 04/17/17 0610 04/17/17 0632  GLUCAP 237* 55* 102*    Meds Scheduled Meds: .  acetaminophen  1,000 mg Oral Q6H  . aspirin EC  325 mg Oral Daily  . atorvastatin  20 mg Oral q1800  . bisacodyl  10 mg Oral Daily   Or  . bisacodyl  10 mg Rectal Daily  . docusate sodium  200 mg Oral Daily  . enoxaparin (LOVENOX) injection  30 mg Subcutaneous Q24H  . furosemide  40 mg Oral Daily  . insulin aspart  0-20 Units Subcutaneous TID WC  . insulin aspart  0-5 Units Subcutaneous QHS  . insulin detemir  32 Units Subcutaneous BID  . mouth rinse  15 mL Mouth Rinse BID  . metoCLOPramide (REGLAN) injection  10 mg Intravenous Q6H  . metolazone  10 mg Oral Daily  . metoprolol tartrate  12.5 mg Oral BID  . pantoprazole  40 mg Oral Daily  . potassium chloride  40 mEq Oral Daily  . sodium chloride flush  3 mL Intravenous Q12H  . sodium chloride flush  3 mL Intravenous Q12H  . Warfarin - Physician Dosing Inpatient   Does not apply q1800   Continuous Infusions: . sodium chloride    . sodium chloride    . lactated ringers Stopped (04/15/17 1500)   PRN Meds:.sodium chloride, metoprolol tartrate, ondansetron (ZOFRAN) IV, oxyCODONE, sodium chloride flush, sodium chloride flush, traMADol  Xrays No results found.  Assessment/Plan: S/P  Procedure(s) (LRB): CORONARY ARTERY BYPASS GRAFTING (CABG), ON PUMP, TIMES FOUR, USING LEFT INTERNAL MAMMARY ARTERY AND ENDOSCOPICALLY HARVESTED RIGHT GREATER SAPHENOUS VEIN (N/A) MITRAL VALVE REPLACEMENT (MVR) (N/A) TRANSESOPHAGEAL ECHOCARDIOGRAM (TEE) (N/A)  1 slow/steady progress 2 hemodyn stable, sinus with PVC's  3 WBC improved, H/H stable 4 INR 1.4- cont coumadin 5 cont diuretics for volume overload, replace K+, creat improved- check bmet in am 6 sugars quite variable- reduce insulin some 7 cont current statin dose 8 wean O2, pulm toilet/cardiac rehab  LOS: 13 days    GOLD,[name] 04/17/2017  Sig LE edema- place TED hose, add short term spiro Hs insulin [SSI]  Stopped patient examined and medical record reviewed,agree with above  note. [name] 04/17/2017

## 2017-04-17 NOTE — Progress Notes (Signed)
Hypoglycemic Event  CBG:55  Treatment:2 cups orange juice  Symptoms:asymptomatic  Follow-up CBG: Time 0632 CBG Result:102  Possible Reasons for Event:possible high dose of long acting insulin?  Comments/MD notified:no    Jamey Reas, [place] [employer]

## 2017-04-17 NOTE — Progress Notes (Signed)
CARDIAC REHAB PHASE I   PRE:  Rate/Rhythm: 92 SR  BP:  Supine:   Sitting: 135/88  Standing:    SaO2: 100% 2L  MODE:  Ambulation: 150 ft   POST:  Rate/Rhythm: 107 ST  BP:  Supine:   Sitting: 139/81  Standing:    SaO2: 97%RA 1015-1040 Pt c/o legs very weak during walk and asked to sit down right outside of door. Pt sats 97%RA during walk. She stopped several times to rest, stating legs weak. Sat in chair at door as she could not make it to recliner. Pt needs to walk more as she only walked once yesterday. Discussed the importance of walking. Left off oxygen. Walked to recliner after resting. Would recommend PT consult also. Walked 150 ft on RA with rolling walker and asst x 1.   Luetta Nutting, RN BSN  04/17/2017 10:38 AM

## 2017-04-18 LAB — BASIC METABOLIC PANEL
Anion gap: 9 (ref 5–15)
BUN: 19 mg/dL (ref 6–20)
CO2: 29 mmol/L (ref 22–32)
Calcium: 8.4 mg/dL — ABNORMAL LOW (ref 8.9–10.3)
Chloride: 101 mmol/L (ref 101–111)
Creatinine, Ser: 0.97 mg/dL (ref 0.44–1.00)
GFR calc Af Amer: >60 mL/min (ref >60)
GFR calc non Af Amer: >60 mL/min (ref >60)
Glucose, Bld: 149 mg/dL — ABNORMAL HIGH (ref 65–99)
Potassium: 3.7 mmol/L (ref 3.5–5.1)
Sodium: 139 mmol/L (ref 135–145)

## 2017-04-18 LAB — GLUCOSE, CAPILLARY
Glucose-Capillary: 105 mg/dL — ABNORMAL HIGH (ref 65–99)
Glucose-Capillary: 143 mg/dL — ABNORMAL HIGH (ref 65–99)
Glucose-Capillary: 206 mg/dL — ABNORMAL HIGH (ref 65–99)
Glucose-Capillary: 254 mg/dL — ABNORMAL HIGH (ref 65–99)

## 2017-04-18 LAB — PROTIME-INR
INR: 1.34
Prothrombin Time: 16.7 seconds — ABNORMAL HIGH (ref 11.4–15.2)

## 2017-04-18 LAB — CBC
HCT: 32.2 % — ABNORMAL LOW (ref 36.0–46.0)
Hemoglobin: 10.3 g/dL — ABNORMAL LOW (ref 12.0–15.0)
MCH: 24.5 pg — ABNORMAL LOW (ref 26.0–34.0)
MCHC: 32 g/dL (ref 30.0–36.0)
MCV: 76.5 fL — ABNORMAL LOW (ref 78.0–100.0)
Platelets: 261 10*3/uL (ref 150–400)
RBC: 4.21 MIL/uL (ref 3.87–5.11)
RDW: 17.5 % — ABNORMAL HIGH (ref 11.5–15.5)
WBC: 12.5 10*3/uL — ABNORMAL HIGH (ref 4.0–10.5)

## 2017-04-18 MED ORDER — BISACODYL 10 MG RE SUPP: 10.0000 mg | RECTAL | Status: DC | PRN

## 2017-04-18 MED ORDER — FUROSEMIDE 10 MG/ML IJ SOLN
40.0000 mg | Freq: Every day | INTRAMUSCULAR | Status: DC
  Administered 2017-04-18: 40 mg via INTRAVENOUS
  Filled 2017-04-18: qty 4

## 2017-04-18 MED ORDER — BISACODYL 5 MG PO TBEC: 10.0000 mg | DELAYED_RELEASE_TABLET | ORAL | Status: DC | PRN

## 2017-04-18 MED ORDER — WARFARIN SODIUM 2.5 MG PO TABS: 2.5000 mg | ORAL_TABLET | Freq: Every day | ORAL | Status: DC

## 2017-04-18 MED ORDER — METFORMIN HCL 500 MG PO TABS
500.0000 mg | ORAL_TABLET | Freq: Two times a day (BID) | ORAL | Status: DC
  Administered 2017-04-18: 500 mg via ORAL
  Filled 2017-04-18: qty 1

## 2017-04-18 MED ORDER — WARFARIN SODIUM 5 MG PO TABS
5.0000 mg | ORAL_TABLET | Freq: Every day | ORAL | Status: AC
  Administered 2017-04-18: 5 mg via ORAL
  Filled 2017-04-18: qty 1

## 2017-04-18 NOTE — Progress Notes (Signed)
[address]       [place] [address]             [phone]      6 Days Post-Op Procedure(s) (LRB): CORONARY ARTERY BYPASS GRAFTING (CABG), ON PUMP, TIMES FOUR, USING LEFT INTERNAL MAMMARY ARTERY AND ENDOSCOPICALLY HARVESTED RIGHT GREATER SAPHENOUS VEIN (N/A) MITRAL VALVE REPLACEMENT (MVR) (N/A) TRANSESOPHAGEAL ECHOCARDIOGRAM (TEE) (N/A) Subjective: Feels fairly weak still  Objective: Vital signs in last 24 hours: Temp:  [97.6 F (36.4 C)-98.3 F (36.8 C)] 98.3 F (36.8 C) (05/16 0442) Pulse Rate:  [96-104] 97 (05/16 0442) Cardiac Rhythm: Normal sinus rhythm;Heart block (05/16 0700) Resp:  [18] 18 (05/16 0442) BP: (132-138)/(72-78) 136/78 (05/16 0442) SpO2:  [98 %-100 %] 100 % (05/16 0442) Weight:  [214 lb 4.8 oz (97.2 kg)] 214 lb 4.8 oz (97.2 kg) (05/16 0442)  Hemodynamic parameters for last 24 hours:    Intake/Output from previous day: No intake/output data recorded. Intake/Output this shift: Total I/O In: 360 [P.O.:360] Out: -   General appearance: alert, cooperative and no distress Heart: regular rate and rhythm and occ extrasystole Lungs: dim in bases Abdomen: benign Extremities: + pitting edema Wound: incis healing well  Lab Results:  Recent Labs  04/17/17 0238 04/18/17 0307  WBC 14.2* 12.5*  HGB 9.5* 10.3*  HCT 30.3* 32.2*  PLT 229 261   BMET:  Recent Labs  04/17/17 0238 04/18/17 0307  NA 141 139  K 3.3* 3.7  CL 103 101  CO2 28 29  GLUCOSE 124* 149*  BUN 26* 19  CREATININE 1.10* 0.97  CALCIUM 8.1* 8.4*    PT/INR:  Recent Labs  04/18/17 0307  LABPROT 16.7*  INR 1.34   ABG    Component Value Date/Time   PHART 7.366 04/13/2017 0335   HCO3 20.2 04/13/2017 0335   TCO2 21 04/13/2017 1711   ACIDBASEDEF 4.2 (H) 04/13/2017 0335   O2SAT 64.6 04/15/2017 0515   CBG (last 3)   Recent Labs  04/17/17 1620 04/17/17 2030 04/18/17 0625  GLUCAP 176* 283* 105*    Meds Scheduled Meds: . aspirin EC  325 mg  Oral Daily  . atorvastatin  20 mg Oral q1800  . docusate sodium  200 mg Oral Daily  . furosemide  40 mg Intravenous Daily  . insulin aspart  0-20 Units Subcutaneous TID WC  . insulin detemir  30 Units Subcutaneous BID  . mouth rinse  15 mL Mouth Rinse BID  . metoprolol tartrate  12.5 mg Oral BID  . pantoprazole  40 mg Oral Daily  . potassium chloride  20 mEq Oral BID  . sodium chloride flush  3 mL Intravenous Q12H  . sodium chloride flush  3 mL Intravenous Q12H  . spironolactone  25 mg Oral Daily  . warfarin  2.5 mg Oral q1800  . Warfarin - Physician Dosing Inpatient   Does not apply q1800   Continuous Infusions: . sodium chloride    . sodium chloride    . lactated ringers Stopped (04/15/17 1500)   PRN Meds:.sodium chloride, bisacodyl **OR** bisacodyl, metoprolol tartrate, ondansetron (ZOFRAN) IV, oxyCODONE, sodium chloride flush, sodium chloride flush, traMADol  Xrays Dg Chest 2 View  Result Date: 04/17/2017 CLINICAL DATA:  Chest pain EXAM: CHEST  2 VIEW COMPARISON:  04/15/2017 FINDINGS: Cardiac shadow remains enlarged. Postsurgical changes are again seen. The lungs again demonstrate left basilar infiltrate which appears have progressed somewhat in the interval from the prior exam. Small left pleural effusion is  likely present as well. The right lung remains clear with the exception of some mild patchy changes in the right upper lobe. No bony abnormality is noted. IMPRESSION: Increasing left basilar infiltrate and likely effusion. Early patchy infiltrate in the right upper lobe. Electronically Signed   By: Alcide Clever M.D.   On: 04/17/2017 07:44    Assessment/Plan: S/P Procedure(s) (LRB): CORONARY ARTERY BYPASS GRAFTING (CABG), ON PUMP, TIMES FOUR, USING LEFT INTERNAL MAMMARY ARTERY AND ENDOSCOPICALLY HARVESTED RIGHT GREATER SAPHENOUS VEIN (N/A) MITRAL VALVE REPLACEMENT (MVR) (N/A) TRANSESOPHAGEAL ECHOCARDIOGRAM (TEE) (N/A)  1 slow progress 2 cont current diuretics for volume  overload, creat improved, K+ being replaced 3 sugars elevated- add glucophage to insulin 4 increase coumadin dose for lower INR- 5 mg 5 wbc conts to improve- urine culture 80K e coli- wont treat at this time 6 push rehab and pulm toilet as able- will ask PT to eval as well   LOS: 14 days    Awa Bachicha E 04/18/2017

## 2017-04-18 NOTE — Evaluation (Signed)
Physical Therapy Evaluation Patient Details Name: Rachael Davis MRN: [id number] DOB: [birth year] Today's Date: 04/18/2017   History of Present Illness  Pt adm with STEMI on 5/2 and underwent CABG x 3 on 5/10. PMH - HTN, DM  Clinical Impression  Pt admitted with above diagnosis and presents to PT with functional limitations due to deficits listed below (See PT problem list). Pt needs skilled PT to maximize independence and safety to allow discharge to home. Pt moving well but with poor functional activity tolerance. Needs rollator to work toward improved activity tolerance.     Follow Up Recommendations Home health PT    Equipment Recommendations  Other (comment) Aeronautical engineer )    Recommendations for Other Services       Precautions / Restrictions Precautions Precautions: Sternal Restrictions Weight Bearing Restrictions: Yes (sternal precautions)      Mobility  Bed Mobility               General bed mobility comments: Pt up in chair  Transfers Overall transfer level: Needs assistance Equipment used: 4-wheeled walker Transfers: Sit to/from Stand Sit to Stand: Supervision         General transfer comment: Showed pt how to come to stand with hands on knees and pt able to perform without physical assist.  Ambulation/Gait Ambulation/Gait assistance: Supervision Ambulation Distance (Feet): 200 Feet Assistive device: 4-wheeled walker Gait Pattern/deviations: Step-through pattern;Decreased stride length Gait velocity: decr Gait velocity interpretation: Below normal speed for age/gender General Gait Details: Pt required 2 sitting rest breaks on rollator. Supervision for safety. Dyspnea 3/4 with amb.  Stairs            Wheelchair Mobility    Modified Rankin (Stroke Patients Only)       Balance Overall balance assessment: No apparent balance deficits (not formally assessed)                                           Pertinent  Vitals/Pain Pain Assessment: 0-10 Pain Score: 4  Pain Location: shoulders Pain Descriptors / Indicators: Sore    Home Living Family/patient expects to be discharged to:: Private residence Living Arrangements: Spouse/significant other Available Help at Discharge: Family;Available 24 hours/day Type of Home: House Home Access: Stairs to enter   [employer] of Steps: 1 Home Layout: One level Home Equipment: None      Prior Function Level of Independence: Independent         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        Extremity/Trunk Assessment   Upper Extremity Assessment Upper Extremity Assessment: Overall WFL for tasks assessed (Limited by sternal precautions)    Lower Extremity Assessment Lower Extremity Assessment: Generalized weakness       Communication   Communication: No difficulties  Cognition Arousal/Alertness: Awake/alert Behavior During Therapy: WFL for tasks assessed/performed Overall Cognitive Status: Within Functional Limits for tasks assessed                                        General Comments      Exercises     Assessment/Plan    PT Assessment Patient needs continued PT services  PT Problem List Decreased activity tolerance;Decreased mobility;Decreased knowledge of precautions       PT Treatment Interventions DME instruction;Gait  training;Functional mobility training;Therapeutic activities;Therapeutic exercise;Patient/family education    PT Goals (Current goals can be found in the Care Plan section)  Acute Rehab PT Goals Patient Stated Goal: Return home PT Goal Formulation: With patient Time For Goal Achievement: 04/25/17 Potential to Achieve Goals: Good    Frequency Min 3X/week   Barriers to discharge        Co-evaluation               AM-PAC PT "6 Clicks" Daily Activity  Outcome Measure Difficulty turning over in bed (including adjusting bedclothes, sheets and blankets)?: A  Little Difficulty moving from lying on back to sitting on the side of the bed? : A Little Difficulty sitting down on and standing up from a chair with arms (e.g., wheelchair, bedside commode, etc,.)?: A Little Help needed moving to and from a bed to chair (including a wheelchair)?: A Little Help needed walking in hospital room?: A Little Help needed climbing 3-5 steps with a railing? : A Little 6 Click Score: 18    End of Session   Activity Tolerance: Patient limited by fatigue Patient left: in chair;with call bell/phone within reach   PT Visit Diagnosis: Difficulty in walking, not elsewhere classified (R26.2)    Time: 1275-1700 PT Time Calculation (min) (ACUTE ONLY): 29 min   Charges:   PT Evaluation $PT Eval Moderate Complexity: 1 Procedure PT Treatments $Gait Training: 8-22 mins   PT G CodesMarland Kitchen        [facility] [phone]   Angelina Ok [facility] 04/18/2017, 11:56 AM

## 2017-04-18 NOTE — Progress Notes (Signed)
Patient ambulated 100 feet using rolling walker on room air,stopped 3X complaining of weak leg and mild SOB,back to recliner,O2 saturation post ambulation is 94% on room air.

## 2017-04-18 NOTE — Progress Notes (Signed)
Pacing wires removed per order. Patient instructed on bedrest x1hr and q15 vital signs.  

## 2017-04-18 NOTE — Progress Notes (Signed)
CARDIAC REHAB PHASE I   PRE:  Rate/Rhythm: 99 SR  BP:  Supine:   Sitting: 139/84  Standing:    SaO2:95%RA   MODE:  Ambulation: 150 ft   POST:  Rate/Rhythm: 88-103 ST  BP:  Supine:   Sitting: 147/94  Standing:    SaO2: 97%RA 1340-1403 Pt walked 150 ft on RA with rollator and minimal asst. Pt sat twice to rest. Did not want to go farther after resting second time. Encouraged third walk with staff. Sats good on RA. To recliner with call bell. Some SOB noted. Tired to get discharge video to play but system did not work.   Luetta Nutting, RN BSN  04/18/2017 1:59 PM

## 2017-04-19 LAB — BASIC METABOLIC PANEL
Anion gap: 9 (ref 5–15)
BUN: 20 mg/dL (ref 6–20)
CO2: 29 mmol/L (ref 22–32)
Calcium: 8.3 mg/dL — ABNORMAL LOW (ref 8.9–10.3)
Chloride: 99 mmol/L — ABNORMAL LOW (ref 101–111)
Creatinine, Ser: 1.01 mg/dL — ABNORMAL HIGH (ref 0.44–1.00)
GFR calc Af Amer: >60 mL/min (ref >60)
GFR calc non Af Amer: 59 mL/min — ABNORMAL LOW (ref >60)
Glucose, Bld: 191 mg/dL — ABNORMAL HIGH (ref 65–99)
Potassium: 3.9 mmol/L (ref 3.5–5.1)
Sodium: 137 mmol/L (ref 135–145)

## 2017-04-19 LAB — PROTIME-INR
INR: 1.48
Prothrombin Time: 18 seconds — ABNORMAL HIGH (ref 11.4–15.2)

## 2017-04-19 LAB — GLUCOSE, CAPILLARY
Glucose-Capillary: 147 mg/dL — ABNORMAL HIGH (ref 65–99)
Glucose-Capillary: 181 mg/dL — ABNORMAL HIGH (ref 65–99)
Glucose-Capillary: 226 mg/dL — ABNORMAL HIGH (ref 65–99)

## 2017-04-19 MED ORDER — WARFARIN SODIUM 5 MG PO TABS
5.0000 mg | ORAL_TABLET | Freq: Once | ORAL | Status: AC
Start: 2017-04-19 — End: 2017-04-19
  Administered 2017-04-19: 5 mg via ORAL
  Filled 2017-04-19: qty 1

## 2017-04-19 MED ORDER — FUROSEMIDE 40 MG PO TABS
40.0000 mg | ORAL_TABLET | Freq: Every day | ORAL | Status: DC
  Administered 2017-04-19 – 2017-04-21 (×3): 40 mg via ORAL
  Filled 2017-04-19 (×3): qty 1

## 2017-04-19 MED ORDER — LISINOPRIL 10 MG PO TABS
10.0000 mg | ORAL_TABLET | Freq: Every day | ORAL | Status: DC
  Administered 2017-04-19 – 2017-04-21 (×3): 10 mg via ORAL
  Filled 2017-04-19 (×3): qty 1

## 2017-04-19 MED ORDER — METFORMIN HCL 500 MG PO TABS
1000.0000 mg | ORAL_TABLET | Freq: Two times a day (BID) | ORAL | Status: DC
  Administered 2017-04-19 – 2017-04-21 (×4): 1000 mg via ORAL
  Filled 2017-04-19 (×4): qty 2

## 2017-04-19 NOTE — Progress Notes (Signed)
Patient walk 150 feet on room air using rollator with RN.Sit once to rest.Back to recliner,O2 saturation is 96%,HR 102.

## 2017-04-19 NOTE — Discharge Summary (Signed)
Physician Discharge Summary  Patient ID: Rachael Davis MRN: [id number] DOB/AGE: [birth year] 61 y.o.  Admit date: 04/04/2017 Discharge date: 04/21/2017  Admission Diagnoses:   Patient Active Problem List   Diagnosis Date Noted  . Coronary artery disease involving native heart without angina pectoris 04/04/2017  . Essential hypertension 04/04/2017  . 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    Discharge Diagnoses:   Patient Active Problem List   Diagnosis Date Noted  . 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  . Coronary artery disease involving native heart without angina pectoris 04/04/2017  . Essential hypertension 04/04/2017  . 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     History of Present Illness:  at time of consultation The patient is a very nice 61 year old obese AA female diabetic nonsmoker who presents to the hospital with 24 hours of chest discomfort cough and malaise. She had positive cardiac enzymes and ST segment changes consistent with ST elevation MI. Her risk factors include hypertension, diabetes, nephropathy, and positive family history for MI in her father who died in his 24s. Her chest pain improved with nitroglycerin in the ED. She was taken for urgent cardiac catheterization which demonstrated LVEDP 22 mmHg. LVEF 40%. Acute occlusion of the circumflex, chronic occlusion of the RCA, high-grade stenosis of the LAD and ramus intermediate. She did not undergo PCI. It appears she was on norepinephrine at some point during the cardiac catheterization for low blood pressure. She was admitted to the CCU for further observation and consideration of multivessel CABG due to her diabetes three-vessel disease and recent MI with moderate LV dysfunction. Her troponin continues to rise today and is now 10.5. She has denied chest pain or  significant shortness of breath. She has had some PVCs. Blood pressure is 95/70. Chest x-ray shows some mild interstitial edema.   Discharged Condition: good  Hospital Course: The patient was admitted through the emergency department for further evaluation and treatment for STEMI. Cardiology consultation was obtained who felt she presented with a completed infarct. She was found to have severe three-vessel coronary artery disease on catheterization and cardiothoracic surgical consultation was obtained. The patient and her studies were evaluated by Dr. Donata Clay who agree with recommendations to proceed with surgical coronary artery revascularization. Additionally. Due to moderate mitral regurgitation about would be evaluated surgery. On 04/12/2017 she was taken the operating room where she underwent the below described procedure. She tolerated well was taken to the surgical intensive care unit in stable condition.  Postoperative hospital course: The patient is overall made slow and steady progress. She did initially require some inotropic support. She was extubated without difficulty using standard protocols. She did have an expected acute blood loss anemia. She had some acute on chronic combined systolic and diastolic CHF with volume excess and has required a fairly aggressive diuresis. She did have some early prerenal azotemia with elevation of her creatinine but this has returned to normal range. Her diabetes has been treated aggressively initially with Glucomander protocol with transition to insulin and oral medications. All routine lines, monitors and drainage 5 7 discontinued in the standard fashion. She has been started on Coumadin and INR has been checked daily. She has been somewhat slow in regards to her physical recovery but is making some steady progress. Physical therapy has assisted with this. Incisions are noted be healing well without evidence of infection. She is tolerating  diet. Most  recent INR at time of discharge is 1.93 and coumadin dose is 5 mg daily . At time of discharge the patient is felt to be quite stable.    Consults: cardiology  Significant Diagnostic Studies: routine post op lab/serial CXR's  Treatments: surgery:   DATE OF PROCEDURE:  04/10/2017 DATE OF DISCHARGE:                              OPERATIVE REPORT   OPERATIONS: 1. Coronary artery bypass grafting x4 (left internal mammary artery to     left anterior descending, saphenous vein graft to ramus     intermedius, saphenous vein graft to obtuse marginal, saphenous     vein graft to right coronary artery). 2. Endoscopic harvest of right leg greater saphenous vein. 3. Mitral valve replacement with a 25 mm [facility] Ease tissue     valve, serial [id number].  SURGEON:  Kerin Perna, M.D.  ASSISTANT:  Jari Favre, PA-C.  ANESTHESIA:  General.  PREOPERATIVE DIAGNOSIS: 1. Acute ST-elevation myocardial infarction with total occlusion of     the circumflex. 2. Ischemic cardiomyopathy. 3. Mitral valve regurgitation from both ischemic mitral regurgitation     and mild prolapse of anterior leaflet.  Disposition: 01-Home or Self Care   Discharge medications:  Discharge Instructions    Amb Referral to Cardiac Rehabilitation    Complete by:  As directed    Diagnosis:   CABG Valve Replacement STEMI     Valve:  Mitral   CABG X ___:  4     Allergies as of 04/21/2017      Reactions   No Known Allergies       Medication List    STOP taking these medications   ibuprofen 800 MG tablet Commonly known as:  ADVIL,MOTRIN   losartan 25 MG tablet Commonly known as:  COZAAR   triamterene-hydrochlorothiazide 75-50 MG tablet Commonly known as:  MAXZIDE     TAKE these medications   aspirin 325 MG EC tablet Take 1 tablet (325 mg total) by mouth daily.   atorvastatin 20 MG tablet Commonly known as:  LIPITOR Take 1 tablet (20 mg total) by mouth daily at 6 PM.   FARXIGA 10 MG  Tabs tablet Generic drug:  dapagliflozin propanediol Take 10 mg by mouth daily.   furosemide 40 MG tablet Commonly known as:  LASIX Take 1 tablet (40 mg total) by mouth daily.   glipiZIDE 10 MG 24 hr tablet Commonly known as:  GLUCOTROL XL Take 10 mg by mouth 2 (two) times daily.   lisinopril 10 MG tablet Commonly known as:  PRINIVIL,ZESTRIL Take 1 tablet (10 mg total) by mouth daily.   metFORMIN 1000 MG tablet Commonly known as:  GLUCOPHAGE Take 1,000 mg by mouth 2 (two) times daily with a meal.   metoprolol tartrate 25 MG tablet Commonly known as:  LOPRESSOR Take 0.5 tablets (12.5 mg total) by mouth 2 (two) times daily.   ONGLYZA 5 MG Tabs tablet Generic drug:  saxagliptin HCl Take 5 mg by mouth daily.   potassium chloride SA 20 MEQ tablet Commonly known as:  K-DUR,KLOR-CON Take 1 tablet (20 mEq total) by mouth 2 (two) times daily.   traMADol 50 MG tablet Commonly known as:  ULTRAM Take 1-2 tablets (50-100 mg total) by mouth every 4 (four) hours as needed for moderate pain.   warfarin 5 MG tablet Commonly known as:  COUMADIN Take  1 tablet (5 mg total) by mouth daily at 6 PM.            Durable Medical Equipment        Start     Ordered   04/21/17 0803  For home use only DME 4 wheeled rolling walker with seat  Once    Comments:  S/P CABG  Question:  Patient needs a walker to treat with the following condition  Answer:  Physical deconditioning   04/21/17 0802     Follow-up Information    Kerin Perna, MD Follow up on 05/16/2017.   Specialty:  Cardiothoracic Surgery Why:  Appointment is at 1:30, please get CXR at 1:00 at [facility] imaging located on first floor of our office building Contact information: [address] [employer] [address] [place] [address] [phone]        [facility] Follow up on 04/23/2017.   Specialty:  Cardiology Why:  Appointment is at 10:30 for PT/INR follow up Contact information: [address] [place] [address] [phone]       Abelino Derrick, PA-C Follow up on 05/08/2017.   Specialties:  Cardiology, Radiology Why:  Appointment is at 11:30 Contact information: [address] [place] [address] [phone]          The patient has been discharged on:   1.Beta Blocker:  Yes [x   ]                              No   [   ]                              If No, reason:  2.Ace Inhibitor/ARB: Yes [ x  ]                                     No  [    ]                                     If No, reason:  3.Statin:   Yes [  x ]                  No  [   ]                  If No, reason:  4.Ecasa:  Yes  [x   ]                  No   [   ]                  If No, reason:  Signed: Summer Parthasarathy 04/21/2017, 8:25 AM

## 2017-04-19 NOTE — Progress Notes (Signed)
CARDIAC REHAB PHASE I   PRE:  Rate/Rhythm: 104 ST PVCs  BP:  Supine:   Sitting: 140/80  Standing:    SaO2: 96%RA  MODE:  Ambulation: 290 ft   POST:  Rate/Rhythm: 110 ST PVCs  BP:  Supine:   Sitting: 145/93  Standing:    SaO2: 96%RA [id number] Pt able to walk farther today. Walked 290 ft on RA with rollator and asst x 1. Had to sit twice in hall to rest. Stated legs felt stronger today. To recliner after walk. Encouraged two more walks today.   Luetta Nutting, RN BSN  04/19/2017 9:29 AM

## 2017-04-19 NOTE — Discharge Instructions (Signed)
What You Need to Know About Warfarin Warfarin is a blood thinner (anticoagulant). Anticoagulants help to prevent the formation of blood clots. They also help to stop the growth of blood clots. Who should use warfarin? Warfarin is prescribed for people who are at risk for developing harmful blood clots, such as people who have:  Surgically implanted mechanical heart valves.  Irregular heart rhythms (atrial fibrillation).  Certain clotting disorders.  A history of harmful blood clotting in the past. This includes people who have had:  A stroke.  Blood clot in the lungs (pulmonary embolism, or PE).  Blood clot in the legs (deep vein thrombosis, or DVT).  An existing blood clot. How is warfarin taken?   Warfarin is a medicine that you take by mouth (orally). Warfarin tablets come in different strengths. Each tablet strength is a different color, with the amount of warfarin printed on the tablet. If you get a new prescription filled and the color of your tablet is different than usual, tell your pharmacist or health care provider immediately. What blood tests do I need while taking warfarin? The goal of warfarin therapy is to lessen the clotting tendency of blood, but not to prevent clotting completely. Your health care provider will monitor the anticoagulation effect of warfarin closely and will adjust your dose as needed. Warfarin is a medicine that needs to be closely monitored, so it is very important to keep all lab visits and follow-up visits with your health care provider. While taking warfarin, you will need to have blood tests (prothrombin tests, or PT tests) regularly to measure your blood clotting time. This type of test can be done with a finger stick or a blood draw. What does the INR test result mean?  The PT test results will be reported as the International Normalized Ratio (INR). The INR tells your health care provider whether your dosage of warfarin needs to be changed. The  longer it takes your blood to clot, the higher the INR. Your health care provider will tell you your target INR range. If your INR is not in your target range, your health care provider may adjust your dosage.  If your INR is above your target range, there is a risk of bleeding. Your dosage of warfarin may need to be decreased.  If your INR is below your target range, there is a risk of clotting. Your dosage of warfarin may need to be increased. How often is the INR test needed?   When you first start warfarin, you will usually have your INR checked every few days.  You may need to have INR tests done more than once a week until you are taking the correct dosage of warfarin.  After you have reached your target INR, your INR will be tested less often. However, you will need to have your INR checked at least once every 4-6 weeks for the entire time you are taking warfarin. What are the side effects of warfarin? Too much warfarin can cause bleeding (hemorrhage) in any part of the body, such as:  Bleeding from the gums.  Unexplained bruises.  Bruises that get larger.  Blood in the urine.  Bloody or dark stools.  Bleeding in the brain (hemorrhagic stroke).  A nosebleed that is not easily stopped.  Coughing up blood.  Vomiting blood. Warfarin use may also cause:  Skin rash or irritations  Nausea that does not go away.  Severe pain in the back or joints.  Painful toes that turn blue  or purple (purple toe syndrome).  Painful ulcers that do not go away (skin necrosis). What are the signs and symptoms of a blood clot? Too little warfarin can increase the risk of blood clots in your legs, lungs, or arms. Signs and symptoms of a DVT in your leg or arm may include:  Pain or swelling in your leg or arm.  Skin that is red or warm to the touch on your arm or leg. Signs and symptoms of a pulmonary embolism may include:  Shortness of breath or difficulty breathing.  Chest  pain.  Unexplained fever. What are the signs and symptoms of a stroke? If you are taking too much or too little warfarin, you can have a stroke. Signs and symptoms of a stroke may include:  Weakness or numbness of your face, arm, or leg, especially on one side of your body.  Confusion or trouble thinking clearly.  Difficulty seeing with one or both eyes.  Difficulty walking or moving your arms or legs.  Dizziness.  Loss of balance or coordination.  Trouble speaking, trouble understanding speech, or both (aphasia).  Sudden, severe headache with no known cause.  Partial or total loss of consciousness. What precautions do I need to take while using warfarin?   Take warfarin exactly as told by your health care provider. Doing this helps you avoid bleeding or blood clots that could result in serious injury, pain, or disability.  Take your medicine at the same time every day. If you forget to take your dose of warfarin, take it as soon as you remember that day. If you do not remember on that day, do not take an extra dose the next day.  Contact your health care provider if you miss or take an extra dose. Do not change your dosage on your own to make up for missed or extra doses.  Wear or carry identification that says that you are taking warfarin.  Make sure that all health care providers, including your dentist, know you are taking warfarin.  If you need surgery, talk with your health care provider about whether you should stop taking warfarin before your surgery.  Avoid situations that cause bleeding. You may bleed more easily while taking warfarin. To limit bleeding, take the following actions:  Use a softer toothbrush.  Floss with waxed floss, not unwaxed floss.  Shave with an electric razor, not with a blade.  Limit your use of sharp objects.  Avoid potentially harmful activities, such as contact sports. What do I need to know about warfarin and pregnancy or  breastfeeding?  Warfarin is not recommended during the first trimester of pregnancy due to an increased risk of birth defects. In certain situations, a woman may take warfarin after her first trimester of pregnancy.  If you are taking warfarin and you become pregnant or plan to become pregnant, contact your health care provider right away.  If you plan to breastfeed while taking warfarin, talk with your health care provider first. What do I need to know about warfarin and alcohol or drug use?  Avoid drinking alcohol, or limit alcohol intake to no more than 1 drink a day for nonpregnant women and 2 drinks a day for men. One drink equals 12 oz of beer, 5 oz of wine, or 1 oz of hard liquor.  If you change the amount of alcohol that you drink, tell your health care provider. Your warfarin dosage may need to be changed.    Avoid street drugs while  taking warfarin. The effects of street drugs on warfarin are not known. What do I need to know about warfarin and other medicines or supplements?  Many prescription and over-the-counter medicines can interfere with warfarin. Talk with your health care provider or your pharmacist before starting or stopping any new medicines. This includes over-the-counter vitamins, dietary supplements, herbal medicines, and pain medicines. Your warfarin dosage may need to be adjusted.  Some common over-the-counter medicines that may increase the risk of bleeding while taking warfarin include:  Acetaminophen.    NSAIDs, such as ibuprofen or naproxen.  Vitamin E. What do I need to know about warfarin and my diet?  It is important to maintain a normal, balanced diet while taking warfarin. Avoid major changes in your diet. If you are going to change your diet, talk with your health care provider before making changes.  Your health care provider may recommend that you work with a diet and nutrition specialist (dietitian).  Vitamin K decreases the effect of  warfarin, and it is found in many foods. Eat a consistent amount of foods that contain vitamin K. For example, you may decide to eat 2 vitamin K-containing foods each day. Most foods that are high in vitamin K are green and leafy. Common foods that contain high amounts of vitamin K include:  Kale, raw or cooked.  Spinach, raw or cooked.  Collards, raw or cooked.  Swiss chard, raw or cooked.  Mustard greens, raw or cooked.  Turnip greens, raw or cooked.  Parsley, raw.  Broccoli, cooked.  Noodles, eggs, and spinach, enriched.  Brussels sprouts, raw or cooked.  Beet greens, raw or cooked.  Endive, raw.  Cabbage, cooked.  Asparagus, cooked. Foods that contain moderate amounts of vitamin K include:  Broccoli, raw.  Cabbage, raw.  Bok choy, cooked.  Green leaf lettuce, raw  Prunes, stewed.  Rosita Fire.  Kiwi.  Edamame, cooked.  Romaine lettuce, raw.  Avocado.  Tuna, canned in oil.  Okra, cooked.  Black-eyed peas, cooked.  Green beans, cooked or raw.  Blueberries, raw.  Blackberries, raw.  Peas, cooked or raw. Contact a health care provider if:  You miss a dose.  You take an extra dose.  You plan to have any kind of surgery or procedure.  You are unable to take your medicine due to nausea, vomiting, or diarrhea.  You have any major changes in your diet or you plan to make any major changes in your diet.  You start or stop any over-the-counter medicine, prescription medicine, or dietary supplement.  You become pregnant, plan to become pregnant, or think you may be pregnant.  You have menstrual periods that are heavier than usual.  You have unusual bruising. Get help right away if:  You develop symptoms of an allergic reaction, such as:  Swelling of the lips, face, tongue, mouth, or throat.  Rash.  Itching.  Itchy, red, swollen areas of skin (hives).  Trouble breathing.  Chest tightness.  You have:  Signs or symptoms of a  stroke.  Signs or symptoms of a blood clot.  A fall or have an accident, especially if you hit your head.  Blood in your urine. Your urine may look reddish, pinkish, or tea-colored.  Blood in your stool. Your stool may be black or bright red.  Bleeding that does not stop after applying pressure to the area for 30 minutes.  Severe pain in your joints or back.  Purple or blue toes.  Skin ulcers that do not go  away.  You vomit blood or cough up blood. The blood may be bright red, or it may look like coffee grounds. These symptoms may represent a serious problem that is an emergency. Do not wait to see if the symptoms will go away. Get medical help right away. Call your local emergency services (911 in the [place] Do not drive yourself to the hospital. Summary  Warfarin needs to be closely monitored with blood tests. It is very important to keep all lab visits and follow-up visits with your health care provider.  Make sure that you know your target INR range and your warfarin dosage.  Wear or carry identification that says that you are taking warfarin.  Take warfarin at the same time every day. Call your health care provider if you miss a dose or if you take an extra dose. Do not change the dosage of warfarin on your own.  Know the signs and symptoms of blood clots, bleeding, and a stroke. Know when to get emergency medical help.  Tell all health care providers who care for you that you are taking warfarin.  Talk with your health care provider or your pharmacist before starting or stopping any new medicines.  Monitor how much vitamin K you eat every day. Try to eat the same amount every day. This information is not intended to replace advice given to you by your health care provider. Make sure you discuss any questions you have with your health care provider. Document Released: 11/20/2005 Document Revised: 08/01/2016 Document Reviewed: 02/16/2016 Elsevier Interactive Patient  Education  2017 [employer] Mitral Valve Replacement, Care After This sheet gives you information about how to care for yourself after your procedure. Your health care provider may also give you more specific instructions. If you have problems or questions, contact your health care provider. What can I expect after the procedure? After the procedure, it is common to have:  Pain at the incision area that may last for several weeks. Follow these instructions at home: Incision care   Follow instructions from your health care provider about how to take care of your incision. Make sure you:  Wash your hands with soap and water before you change your bandage (dressing). If soap and water are not available, use hand sanitizer.  Change your dressing as told by your health care provider.  Leave stitches (sutures), skin glue, or adhesive strips in place. These skin closures may need to stay in place for 2 weeks or longer. If adhesive strip edges start to loosen and curl up, you may trim the loose edges. Do not remove adhesive strips completely unless your health care provider tells you to do that.  Check your incision area every day for signs of infection. Check for:  More redness, swelling, or pain.  More fluid or blood.  Warmth.  Pus or a bad smell.   Do not apply powder or lotion to the area. Driving   Do not drive until your health care provider approves.  Do not drive or use heavy machinery while taking prescription pain medicines. Bathing   Do not take baths, swim, or use a hot tub for 2-4 weeks after surgery, or until your health care provider approves. Ask your health care provider if you may take showers.  To wash the incision site, gently wash with soap and water and pat the area dry with a clean towel. Do not rub the incision area. That may cause bleeding. Activity   Rest as told by  your health care provider. Ask your health care provider when you can resume normal  activities, including sexual activity.  Avoid the following activities for 6-8 weeks, or as long as directed:  Lifting anything that is heavier than 10 lb (4.5 kg), or the limit that your health care provider tells you.  Pushing or pulling things with your arms.  Avoid climbing stairs and using the handrail to pull yourself up for the first 2-3 weeks after surgery.  Avoid airplane travel for 4-6 weeks, or as long as directed.  Avoid sitting for long periods of time and crossing your legs. Get up and move around at least once every 1-2 hours.  If you are taking blood thinners (anticoagulants), avoid activities that have a high risk of injury. Ask your health care provider what activities are safe for you. Lifestyle   Limit alcohol intake to no more than 1 drink a day for nonpregnant women and 2 drinks a day for men. One drink equals 12 oz of beer, 5 oz of wine, or 1 oz of hard liquor.  Do not use any products that contain nicotine or tobacco, such as cigarettes and e-cigarettes. If you need help quitting, ask your health care provider. General instructions   Take your temperature every day and weigh yourself every morning for the first 7 days after surgery. Write your temperatures and weight down and take this record with you to any follow-up visits.  Take over-the-counter and prescription medicines only as told by your health care provider.  To prevent or treat constipation while you are taking prescription pain medicine, your health care provider may recommend that you:  Drink enough fluid to keep your urine clear or pale yellow.  Take over-the-counter or prescription medicines.  Eat foods that are high in fiber, such as fresh fruits and vegetables, whole grains, and beans.  Limit foods that are high in fat and processed sugars, such as fried and sweet foods.  Follow instructions from your health care provider about eating or drinking restrictions.  Wear compression stockings  for at least 2 weeks, or as long as told by your health care provider. These stockings help to prevent blood clots and reduce swelling in your legs. If your ankles are swollen after 2 weeks, continue to wear the stockings.  Keep all follow-up visits as told by your health care provider. This is important. Contact a health care provider if:  You develop a skin rash.  Your weight is increasing each day over 2-3 days.  You gain 2 lb (1 kg) or more in a single day.  You have a fever. Get help right away if:  You develop chest pain that feels different from the pain caused by your incision.  You develop shortness of breath or difficulty breathing.  You have more redness, swelling, or pain around your incision.  You have more fluid or blood coming from your incision.  Your incision feels warm to the touch.  You have pus or a bad smell coming from your incision.  You feel light-headed. This information is not intended to replace advice given to you by your health care provider. Make sure you discuss any questions you have with your health care provider. Document Released: 06/09/2005 Document Revised: 09/01/2016 Document Reviewed: 09/01/2016 Elsevier Interactive Patient Education  2017 [employer] Coronary Artery Bypass Grafting Coronary artery bypass grafting (CABG) is a surgery that is done when arteries of the heart have become narrow or blocked with a fatty, waxy buildup.  These arteries supply the heart with oxygen and nutrients it needs to pump blood to your body. During CABG, a section of blood vessel from another part of the body is taken and placed where there is narrowing or blocking. What happens before the procedure?  Take all medicines as told by your doctor. You may be asked to start new medicines or stop others. Do not stop medicines or take different amounts on your own.  Do not eat or drink anything after midnight on the night before the surgery or as told by your  doctor. Ask your doctor if it is okay to take a sip of water with any needed medicines. What happens during the procedure? There are two ways of performing this surgery. Traditional open surgery   You will be given medicine to make you sleep through the surgery (general anesthetic).  Once you are sleeping, a cut (incision) is made down the front of the chest through your breastbone. Your breastbone is spread open so your heart can be seen.  You are then placed on a heart-lung bypass machine. This machine gives oxygen to your blood while the heart is being worked on.  Your heart is then stopped for a short amount of time. This is so Designer, industrial/product can do the next steps.  Part of a leg vein may be taken and used to go around (bypass) the blocked heart arteries. Sometimes parts of an artery from inside your chest wall or from your arm are used.  When the bypass is done, you are taken off the machine.  Your heart is started again. It will start pumping as it once did.  The sac around your heart is closed.  Your chest is closed with stitches or staples.  You will have tubes in your chest. These tubes are connected to a suction device. This device will drain fluid and puff up your lungs again. Minimally invasive surgery  This surgery is done from a cut that is made on the left side of your chest. If needed, your surgeon may not have to slow or stop your heart. What happens after the procedure?  You will be taken to a recovery area to be watched.  You may wake up with a tube in your throat that helps you breathe. You may be connected to a breathing machine. You will not be able to talk when the tube is in. The tube is taken out when it is safe.  You may be groggy and have some pain. You will be given medicine to help the pain. This information is not intended to replace advice given to you by your health care provider. Make sure you discuss any questions you have with your health care  provider. Document Released: 11/25/2013 Document Revised: 04/27/2016 Document Reviewed: 04/29/2013 Elsevier Interactive Patient Education  2017 [employer].

## 2017-04-19 NOTE — Progress Notes (Addendum)
[address]       [place] [id number]             [phone]      7 Days Post-Op Procedure(s) (LRB): CORONARY ARTERY BYPASS GRAFTING (CABG), ON PUMP, TIMES FOUR, USING LEFT INTERNAL MAMMARY ARTERY AND ENDOSCOPICALLY HARVESTED RIGHT GREATER SAPHENOUS VEIN (N/A) MITRAL VALVE REPLACEMENT (MVR) (N/A) TRANSESOPHAGEAL ECHOCARDIOGRAM (TEE) (N/A) Subjective: Feeling stronger, ambulation improving  Objective: Vital signs in last 24 hours: Temp:  [97.6 F (36.4 C)-98.2 F (36.8 C)] 97.6 F (36.4 C) (05/17 0344) Pulse Rate:  [92-102] 93 (05/17 0344) Cardiac Rhythm: Sinus tachycardia;Heart block (05/16 1900) Resp:  [16-18] 18 (05/17 0344) BP: (130-151)/(78-110) 130/78 (05/17 0344) SpO2:  [97 %-100 %] 97 % (05/17 0344) Weight:  [213 lb (96.6 kg)] 213 lb (96.6 kg) (05/17 0344)  Hemodynamic parameters for last 24 hours:    Intake/Output from previous day: 05/16 0701 - 05/17 0700 In: 840 [P.O.:840] Out: 1300 [Urine:1300] Intake/Output this shift: No intake/output data recorded.  General appearance: alert, cooperative and no distress Heart: regular rate and rhythm and + extrasystoles Lungs: dim in lower fields Abdomen: benign Extremities: less edema Wound: incis healing well  Lab Results:  Recent Labs  04/17/17 0238 04/18/17 0307  WBC 14.2* 12.5*  HGB 9.5* 10.3*  HCT 30.3* 32.2*  PLT 229 261   BMET:  Recent Labs  04/18/17 0307 04/19/17 0316  NA 139 137  K 3.7 3.9  CL 101 99*  CO2 29 29  GLUCOSE 149* 191*  BUN 19 20  CREATININE 0.97 1.01*  CALCIUM 8.4* 8.3*    PT/INR:  Recent Labs  04/19/17 0316  LABPROT 18.0*  INR 1.48   ABG    Component Value Date/Time   PHART 7.366 04/13/2017 0335   HCO3 20.2 04/13/2017 0335   TCO2 21 04/13/2017 1711   ACIDBASEDEF 4.2 (H) 04/13/2017 0335   O2SAT 64.6 04/15/2017 0515   CBG (last 3)   Recent Labs  04/18/17 1707 04/18/17 2130 04/19/17 0653  GLUCAP 206* 254* 147*    Meds Scheduled  Meds: . aspirin EC  325 mg Oral Daily  . atorvastatin  20 mg Oral q1800  . docusate sodium  200 mg Oral Daily  . furosemide  40 mg Intravenous Daily  . insulin aspart  0-20 Units Subcutaneous TID WC  . insulin detemir  30 Units Subcutaneous BID  . mouth rinse  15 mL Mouth Rinse BID  . metFORMIN  500 mg Oral BID WC  . metoprolol tartrate  12.5 mg Oral BID  . pantoprazole  40 mg Oral Daily  . potassium chloride  20 mEq Oral BID  . sodium chloride flush  3 mL Intravenous Q12H  . sodium chloride flush  3 mL Intravenous Q12H  . spironolactone  25 mg Oral Daily  . Warfarin - Physician Dosing Inpatient   Does not apply q1800   Continuous Infusions: . sodium chloride    . sodium chloride    . lactated ringers Stopped (04/15/17 1500)   PRN Meds:.sodium chloride, bisacodyl **OR** bisacodyl, metoprolol tartrate, ondansetron (ZOFRAN) IV, oxyCODONE, sodium chloride flush, sodium chloride flush, traMADol  Xrays Dg Chest 2 View  Result Date: 04/17/2017 CLINICAL DATA:  Chest pain EXAM: CHEST  2 VIEW COMPARISON:  04/15/2017 FINDINGS: Cardiac shadow remains enlarged. Postsurgical changes are again seen. The lungs again demonstrate left basilar infiltrate which appears have progressed somewhat in the interval from the prior exam. Small left pleural effusion is likely  present as well. The right lung remains clear with the exception of some mild patchy changes in the right upper lobe. No bony abnormality is noted. IMPRESSION: Increasing left basilar infiltrate and likely effusion. Early patchy infiltrate in the right upper lobe. Electronically Signed   By: Alcide Clever M.D.   On: 04/17/2017 07:44    Assessment/Plan: S/P Procedure(s) (LRB): CORONARY ARTERY BYPASS GRAFTING (CABG), ON PUMP, TIMES FOUR, USING LEFT INTERNAL MAMMARY ARTERY AND ENDOSCOPICALLY HARVESTED RIGHT GREATER SAPHENOUS VEIN (N/A) MITRAL VALVE REPLACEMENT (MVR) (N/A) TRANSESOPHAGEAL ECHOCARDIOGRAM (TEE) (N/A)  1 steady progress 2  volume status improving, change to po lasix, stop spironolactone soon 3 sugars quite variable- increase glucophage 4  inr rising- cont coumadin 5 creat stable 6 sinus with pvc's persists 7 bp  should tolerate ace- I 8 home 1-2 days hopefully  LOS: 15 days    GOLD,[name] 04/19/2017 Agree with above note No spiro at discharge- cont po lasix, lisinopril DM controlled patient examined and medical record reviewed,agree with above note.  Patient will need HHN, HPT at discharge with INR to coumadin clinic  [name] 04/19/2017

## 2017-04-20 LAB — GLUCOSE, CAPILLARY
Glucose-Capillary: 278 mg/dL — ABNORMAL HIGH (ref 65–99)
Glucose-Capillary: 76 mg/dL (ref 65–99)
Glucose-Capillary: 189 mg/dL — ABNORMAL HIGH (ref 65–99)
Glucose-Capillary: 207 mg/dL — ABNORMAL HIGH (ref 65–99)
Glucose-Capillary: 179 mg/dL — ABNORMAL HIGH (ref 65–99)

## 2017-04-20 LAB — PROTIME-INR
INR: 1.73
Prothrombin Time: 20.5 seconds — ABNORMAL HIGH (ref 11.4–15.2)

## 2017-04-20 MED ORDER — WARFARIN SODIUM 5 MG PO TABS
5.0000 mg | ORAL_TABLET | Freq: Every day | ORAL | Status: DC
  Administered 2017-04-20: 5 mg via ORAL
  Filled 2017-04-20: qty 1

## 2017-04-20 MED ORDER — WARFARIN SODIUM 5 MG PO TABS: 5.0000 mg | ORAL_TABLET | Freq: Every day | ORAL | Status: DC

## 2017-04-20 NOTE — Progress Notes (Signed)
Physical Therapy Treatment Patient Details Name: Rachael Davis MRN: [id number] DOB: 01/26/1956 Today's Date: 04/20/2017    History of Present Illness Pt adm with STEMI on 5/2 and underwent CABG x 3 on 5/10. PMH - HTN, DM    PT Comments    Pt sitting in recliner upon arrival.  Very pleasant and willing to participate in PT session.  Ambulated 300' with supervision for safety, required 3 seated rest breaks due to fatigue with dyspnea 2/4 with amb.  Instructed pt on pursed lip breathing to minimize dyspnea and educated on pacing with activities for energy conservation.  Will cont to follow acutely to maximize functional mobility prior to d/c home with HHPT.     Follow Up Recommendations  Home health PT     Equipment Recommendations       Recommendations for Other Services       Precautions / Restrictions Precautions Precautions: Sternal Restrictions Weight Bearing Restrictions: Yes (sternal precautions)    Mobility  Bed Mobility               General bed mobility comments: sitting in recliner upon arrival  Transfers Overall transfer level: Needs assistance Equipment used: Rolling walker (2 wheeled) Transfers: Sit to/from Stand Sit to Stand: Supervision         General transfer comment: completes with proper technique of hands on knees to adhere to sternal precautions  Ambulation/Gait Ambulation/Gait assistance: Supervision Ambulation Distance (Feet): 300 Feet (3 seated rest breaks to complete) Assistive device: 4-wheeled walker Gait Pattern/deviations: Step-through pattern;Decreased stride length Gait velocity: decr   General Gait Details: required 3 seated rest breaks.  cues for pursed lip breathing to minimize SOB.  dyspnea 2/4 with amb   Stairs            Wheelchair Mobility    Modified Rankin (Stroke Patients Only)       Balance                                            Cognition Arousal/Alertness:  Awake/alert Behavior During Therapy: WFL for tasks assessed/performed Overall Cognitive Status: Within Functional Limits for tasks assessed                                        Exercises      General Comments        Pertinent Vitals/Pain Pain Assessment: No/denies pain Pain Score: 0-No pain    Home Living                      Prior Function            PT Goals (current goals can now be found in the care plan section) Acute Rehab PT Goals Patient Stated Goal: to go home Progress towards PT goals: Progressing toward goals    Frequency    Min 3X/week      PT Plan Current plan remains appropriate    Co-evaluation              AM-PAC PT "6 Clicks" Daily Activity  Outcome Measure                   End of Session  Time: 1610-9604 PT Time Calculation (min) (ACUTE ONLY): 23 min  Charges:  $Gait Training: 23-37 mins                    G Codes:         Rachael Davis 04/20/2017, 9:53 AM   Rachael Davis, PTA  04/20/2017

## 2017-04-20 NOTE — Progress Notes (Signed)
      [address]       [place] [address]             [phone]      8 Days Post-Op Procedure(s) (LRB): CORONARY ARTERY BYPASS GRAFTING (CABG), ON PUMP, TIMES FOUR, USING LEFT INTERNAL MAMMARY ARTERY AND ENDOSCOPICALLY HARVESTED RIGHT GREATER SAPHENOUS VEIN (N/A) MITRAL VALVE REPLACEMENT (MVR) (N/A) TRANSESOPHAGEAL ECHOCARDIOGRAM (TEE) (N/A)   Subjective:  [name] looks great.  Has no complaints, she is hoping to go home.   Objective: Vital signs in last 24 hours: Temp:  [97.8 F (36.6 C)-98.4 F (36.9 C)] 98.4 F (36.9 C) (05/18 0456) Pulse Rate:  [90-103] 100 (05/18 0456) Cardiac Rhythm: Heart block (05/18 0700) Resp:  [18] 18 (05/18 0456) BP: (114-145)/(74-93) 114/74 (05/18 0456) SpO2:  [98 %-100 %] 98 % (05/18 0456) Weight:  [211 lb 6.4 oz (95.9 kg)] 211 lb 6.4 oz (95.9 kg) (05/18 0456)  Intake/Output from previous day: 05/17 0701 - 05/18 0700 In: 723 [P.O.:720; I.V.:3] Out: 900 [Urine:900]  General appearance: alert, cooperative and no distress Heart: regular rate and rhythm Lungs: diminished breath sounds bibasilar Abdomen: soft, non-tender; bowel sounds normal; no masses,  no organomegaly Extremities: edema trace Wound: clean and dry  Lab Results:  Recent Labs  04/18/17 0307  WBC 12.5*  HGB 10.3*  HCT 32.2*  PLT 261   BMET:  Recent Labs  04/18/17 0307 04/19/17 0316  NA 139 137  K 3.7 3.9  CL 101 99*  CO2 29 29  GLUCOSE 149* 191*  BUN 19 20  CREATININE 0.97 1.01*  CALCIUM 8.4* 8.3*    PT/INR:  Recent Labs  04/20/17 0420  LABPROT 20.5*  INR 1.73   ABG    Component Value Date/Time   PHART 7.366 04/13/2017 0335   HCO3 20.2 04/13/2017 0335   TCO2 21 04/13/2017 1711   ACIDBASEDEF 4.2 (H) 04/13/2017 0335   O2SAT 64.6 04/15/2017 0515   CBG (last 3)   Recent Labs  04/19/17 1116 04/19/17 2054 04/20/17 0558  GLUCAP 181* 226* 76    Assessment/Plan: S/P Procedure(s) (LRB): CORONARY ARTERY BYPASS GRAFTING  (CABG), ON PUMP, TIMES FOUR, USING LEFT INTERNAL MAMMARY ARTERY AND ENDOSCOPICALLY HARVESTED RIGHT GREATER SAPHENOUS VEIN (N/A) MITRAL VALVE REPLACEMENT (MVR) (N/A) TRANSESOPHAGEAL ECHOCARDIOGRAM (TEE) (N/A)  1. CV- NSR with PVCs- rate in the 100s, BP controlled- continue Lopressor, Lisinopril 2. Pulm- no acute issues, off oxygen, continue IS 3. INR 1.73, continue coumadin at 5 mg  4. Renal- hasn't been checked since 5/16- on lasix and Spironolactone will repeat BMET in AM 5. Dispo- patient looks great is stable.. Ready for d/c in AM per PVT   LOS: 16 days    Riki Gehring 04/20/2017

## 2017-04-20 NOTE — Progress Notes (Signed)
CARDIAC REHAB PHASE I   PRE:  Rate/Rhythm: 97 SR PVCs  BP:  Supine:   Sitting: 119/80  Standing:    SaO2: 98%RA  MODE:  Ambulation: 350 ft   POST:  Rate/Rhythm: 107 ST PVCs  BP:  Supine:   Sitting: 124/82  Standing:    SaO2: 96%RA 1105-1200 Pt walked 350 ft on RA with rollator and minimal asst. Sat twice to rest due to legs weak and SOB. Encouraged pt use purse lip breathing. Case manager to get rollator for home use. To recliner after walk. Pt is getting stronger with walks and tolerating better. Education completed with pt who voiced understanding. Wrote down how to view discharge video. Reviewed IS use, sternal precautions, and ex ed, and carb counting and heart healthy food choices. Discussed CRP 2 and referring to [place] program.   Luetta Nutting, RN BSN  04/20/2017 11:51 AM

## 2017-04-21 LAB — PROTIME-INR
INR: 1.93
Prothrombin Time: 22.4 seconds — ABNORMAL HIGH (ref 11.4–15.2)

## 2017-04-21 LAB — GLUCOSE, CAPILLARY
Glucose-Capillary: 73 mg/dL (ref 65–99)
Glucose-Capillary: 187 mg/dL — ABNORMAL HIGH (ref 65–99)

## 2017-04-21 MED ORDER — ASPIRIN 325 MG PO TBEC
325.0000 mg | DELAYED_RELEASE_TABLET | Freq: Every day | ORAL | 0 refills | Status: DC
Start: 2017-04-21 — End: 2017-06-05

## 2017-04-21 MED ORDER — LISINOPRIL 10 MG PO TABS: 10.0000 mg | ORAL_TABLET | Freq: Every day | ORAL | 3 refills | Status: DC

## 2017-04-21 MED ORDER — ATORVASTATIN CALCIUM 20 MG PO TABS: 20.0000 mg | ORAL_TABLET | Freq: Every day | ORAL | 3 refills | Status: DC

## 2017-04-21 MED ORDER — WARFARIN SODIUM 5 MG PO TABS: 5.0000 mg | ORAL_TABLET | Freq: Every day | ORAL | 3 refills | Status: DC

## 2017-04-21 MED ORDER — METOPROLOL TARTRATE 25 MG PO TABS: 12.5000 mg | ORAL_TABLET | Freq: Two times a day (BID) | ORAL | 3 refills | Status: DC

## 2017-04-21 MED ORDER — FUROSEMIDE 40 MG PO TABS: 40.0000 mg | ORAL_TABLET | Freq: Every day | ORAL | 3 refills | Status: DC

## 2017-04-21 MED ORDER — POTASSIUM CHLORIDE CRYS ER 20 MEQ PO TBCR: 20.0000 meq | EXTENDED_RELEASE_TABLET | Freq: Two times a day (BID) | ORAL | 1 refills | Status: DC

## 2017-04-21 MED ORDER — TRAMADOL HCL 50 MG PO TABS: 50.0000 mg | ORAL_TABLET | ORAL | 0 refills | Status: DC | PRN

## 2017-04-21 NOTE — Progress Notes (Addendum)
      [address]       [place] [address]             [phone]      9 Days Post-Op Procedure(s) (LRB): CORONARY ARTERY BYPASS GRAFTING (CABG), ON PUMP, TIMES FOUR, USING LEFT INTERNAL MAMMARY ARTERY AND ENDOSCOPICALLY HARVESTED RIGHT GREATER SAPHENOUS VEIN (N/A) MITRAL VALVE REPLACEMENT (MVR) (N/A) TRANSESOPHAGEAL ECHOCARDIOGRAM (TEE) (N/A)   Subjective:  Patient doing okay.  She does state her feet are sore after removing her TED hose this morning.   Objective: Vital signs in last 24 hours: Temp:  [97.5 F (36.4 C)-98.1 F (36.7 C)] 97.5 F (36.4 C) (05/19 0535) Pulse Rate:  [90-101] 90 (05/19 0535) Cardiac Rhythm: Normal sinus rhythm (05/19 0700) Resp:  [18-20] 18 (05/19 0535) BP: (105-120)/(53-83) 105/83 (05/19 0535) SpO2:  [97 %-100 %] 99 % (05/19 0535) Weight:  [210 lb (95.3 kg)] 210 lb (95.3 kg) (05/19 0535)  Intake/Output from previous day: 05/18 0701 - 05/19 0700 In: -  Out: 750 [Urine:550; Stool:200]  General appearance: alert, cooperative and no distress Heart: regular rate and rhythm Lungs: clear to auscultation bilaterally Abdomen: soft, non-tender; bowel sounds normal; no masses,  no organomegaly Extremities: edema trace-1+ Wound: clean and dry  Lab Results: No results for input(s): WBC, HGB, HCT, PLT in the last 72 hours. BMET:  Recent Labs  04/19/17 0316  NA 137  K 3.9  CL 99*  CO2 29  GLUCOSE 191*  BUN 20  CREATININE 1.01*  CALCIUM 8.3*    PT/INR:  Recent Labs  04/21/17 0217  LABPROT 22.4*  INR 1.93   ABG    Component Value Date/Time   PHART 7.366 04/13/2017 0335   HCO3 20.2 04/13/2017 0335   TCO2 21 04/13/2017 1711   ACIDBASEDEF 4.2 (H) 04/13/2017 0335   O2SAT 64.6 04/15/2017 0515   CBG (last 3)   Recent Labs  04/20/17 1647 04/20/17 2133 04/21/17 0624  GLUCAP 207* 179* 73    Assessment/Plan: S/P Procedure(s) (LRB): CORONARY ARTERY BYPASS GRAFTING (CABG), ON PUMP, TIMES FOUR, USING LEFT INTERNAL  MAMMARY ARTERY AND ENDOSCOPICALLY HARVESTED RIGHT GREATER SAPHENOUS VEIN (N/A) MITRAL VALVE REPLACEMENT (MVR) (N/A) TRANSESOPHAGEAL ECHOCARDIOGRAM (TEE) (N/A)  1. CV- NSR- continue Lopressor, Lisinopril 2. INR- continues to trend upward at 1.93 will continue current dose of coumadin at discharge 3. Pulm- no acute issues, continue IS 4. Renal- last creatinine WNL, remains hypervolemic, + LE edema- continue Lasix...   5.  Dispo- patient stable, continues to maintain NSR, will d/c home today   LOS: 17 days    BARRETT, ERIN 04/21/2017    Patient in sinus rhythm Edema improved but not yet resolved Ready for discharge home on oral Lasix and Coumadin short-term for mitral valve replacement Discharge instructions including wound care, activity limits, medications, all discussed with patient. patient examined and medical record reviewed,agree with above note. [name] 04/21/2017

## 2017-04-21 NOTE — Care Management Note (Addendum)
Case Management Note  Patient Details  Name: Rachael Davis MRN: [id number] Date of Birth: 12/04/1956  Subjective/Objective:                 Spoke with patient. She would like to use [facility] for [facility] PT and RN, referral made to [facility]. DME rolator will be obtained by patient after DC as none available through [facility] at this time. Patient arranged to have son get it at [facility]. No other CM consults, needs, or orders identified at this time.    Action/Plan:  DC to home w HH.   Expected Discharge Date:  04/21/17               Expected Discharge Plan:  Home w Home Health Services  In-House Referral:     Discharge planning Services  CM Consult  Post Acute Care Choice:  Durable Medical Equipment, Home Health Choice offered to:  Patient  DME Arranged:  Walker rolling with seat DME Agency:  [facility]  HH Arranged:  RN, PT [facility] Agency:  [facility]  Status of Service:  Completed, signed off  If discussed at Long Length of Stay Meetings, dates discussed:    Additional Comments:  Lawerance Sabal, RN 04/21/2017, 8:22 AM

## 2017-04-21 NOTE — Progress Notes (Signed)
Patient requested [facility] company call her husband's cell at [phone]

## 2017-04-23 ENCOUNTER — Ambulatory Visit (INDEPENDENT_AMBULATORY_CARE_PROVIDER_SITE_OTHER): Payer: Managed Care, Other (non HMO) | Admitting: Pharmacist

## 2017-04-23 ENCOUNTER — Telehealth (HOSPITAL_COMMUNITY): Payer: Self-pay

## 2017-04-23 DIAGNOSIS — Z951 Presence of aortocoronary bypass graft: Secondary | ICD-10-CM

## 2017-04-23 DIAGNOSIS — Z7901 Long term (current) use of anticoagulants: Secondary | ICD-10-CM | POA: Diagnosis not present

## 2017-04-23 LAB — POCT INR: INR: 5

## 2017-04-23 NOTE — Telephone Encounter (Signed)
Verified [employer] benefits through Passport No Copay, Coinsurance 20% Deductible $1800.00, pt has met $1800.00 Out of Pocket $4300.00, pt has met $2440.95 Reference [phone]...Marland KitchenMarland KitchenMarland Kitchen KJ

## 2017-04-24 ENCOUNTER — Telehealth (HOSPITAL_COMMUNITY): Payer: Self-pay | Admitting: Physician Assistant

## 2017-04-24 LAB — ECHO TEE
MV Annulus VTI: 44.8 cm
MV M vel: 94.6
Mean grad: 4 mmHg
PISA EROA: 0.22 cm2
VTI: 196 cm

## 2017-04-24 NOTE — Telephone Encounter (Signed)
      [address]       [place] [address]             [phone]    Rachael Davis [id number]   S/P CABG x 4 performed on 04/10/2017.  Discharged home on 04/21/2017  Medications: Current Outpatient Prescriptions on File Prior to Visit  Medication Sig Dispense Refill  . aspirin EC 325 MG EC tablet Take 1 tablet (325 mg total) by mouth daily. 30 tablet 0  . atorvastatin (LIPITOR) 20 MG tablet Take 1 tablet (20 mg total) by mouth daily at 6 PM. 30 tablet 3  . dapagliflozin propanediol (FARXIGA) 10 MG TABS tablet Take 10 mg by mouth daily.    . furosemide (LASIX) 40 MG tablet Take 1 tablet (40 mg total) by mouth daily. 30 tablet 3  . glipiZIDE (GLUCOTROL XL) 10 MG 24 hr tablet Take 10 mg by mouth 2 (two) times daily.    Marland Kitchen lisinopril (PRINIVIL,ZESTRIL) 10 MG tablet Take 1 tablet (10 mg total) by mouth daily. 30 tablet 3  . metFORMIN (GLUCOPHAGE) 1000 MG tablet Take 1,000 mg by mouth 2 (two) times daily with a meal.    . metoprolol tartrate (LOPRESSOR) 25 MG tablet Take 0.5 tablets (12.5 mg total) by mouth 2 (two) times daily. 60 tablet 3  . potassium chloride SA (K-DUR,KLOR-CON) 20 MEQ tablet Take 1 tablet (20 mEq total) by mouth 2 (two) times daily. 60 tablet 1  . saxagliptin HCl (ONGLYZA) 5 MG TABS tablet Take 5 mg by mouth daily.    . traMADol (ULTRAM) 50 MG tablet Take 1-2 tablets (50-100 mg total) by mouth every 4 (four) hours as needed for moderate pain. 30 tablet 0  . warfarin (COUMADIN) 5 MG tablet Take 1 tablet (5 mg total) by mouth daily at 6 PM. 30 tablet 3   No current facility-administered medications on file prior to visit.     Coumadin:  INR check Yes/No. Last draw 5/21. Next draw 5/25  Problems/Concerns: still weak and having some shortness of breath with activity. INR high so holding it 2 days then she will take a half pill thurs then Friday.   Assessment:  Patient is doing well overall. She knows to take her blood sugar level a few times a day so she  can document what she has been running for her PCP. She was able to get all her medications on discharge. Her incisions are healing well per the patient. She is getting around okay. She has a follow-up INR draw on Friday. She knows about her follow-up appointment and plans to call if she has questions.  Contact office if concerns or problems develop. She had no further concerns or questions at this time.   Follow up Appointment:   Kerin Perna, MD Follow up on 05/16/2017.   Specialty:  Cardiothoracic Surgery Why:  Appointment is at 1:30, please get CXR at 1:00 at [facility] imaging located on first floor of our office building Contact information: [address] [place] [address] [phone]   Rachael Favre, PA-C

## 2017-04-26 DIAGNOSIS — Z48812 Encounter for surgical aftercare following surgery on the circulatory system: Secondary | ICD-10-CM | POA: Diagnosis not present

## 2017-04-27 ENCOUNTER — Ambulatory Visit (INDEPENDENT_AMBULATORY_CARE_PROVIDER_SITE_OTHER): Payer: Managed Care, Other (non HMO) | Admitting: Pharmacist Clinician (PhC)/ Clinical Pharmacy Specialist

## 2017-04-27 ENCOUNTER — Telehealth: Payer: Self-pay | Admitting: Cardiovascular Disease

## 2017-04-27 DIAGNOSIS — Z951 Presence of aortocoronary bypass graft: Secondary | ICD-10-CM

## 2017-04-27 DIAGNOSIS — Z7901 Long term (current) use of anticoagulants: Secondary | ICD-10-CM

## 2017-04-27 LAB — POCT INR: INR: 4

## 2017-04-27 NOTE — Telephone Encounter (Signed)
LM for patient to return call, no DPR on file to speak with [facility]. Patient is on warfarin  Per discharge summary:  1. Coronary artery bypass grafting x4 (left internal mammary artery to left anterior descending, saphenous vein graft to ramus intermedius, saphenous vein graft to obtuse marginal, saphenous vein graft to right coronary artery). 2. Endoscopic harvest of right leg greater saphenous vein. 3. Mitral valve replacement with a 25 mm [facility] Ease tissue valve, serial [id number].

## 2017-04-27 NOTE — Telephone Encounter (Signed)
New message    Pt had surgery on 5/18. Pt daughter is calling because she is having blood in her urine. She said it is more like a spotting.

## 2017-04-27 NOTE — Telephone Encounter (Signed)
Patient returned call. She states she is having blood in her urine, blood when she wipes. She was constipated yesterday, so she strained to use bathroom. She is noticing blood in her underwear and in the toilet. Her coumadin was checked today and was a little high - she states she notified pharmacy staff. Advised that she continue to monitor symptoms, try to avoid straining, use moist toilettes to wipe for less abrasion.   Routed to pharmacy as Lorain Childes

## 2017-05-01 ENCOUNTER — Telehealth: Payer: Self-pay | Admitting: Cardiovascular Disease

## 2017-05-01 NOTE — Telephone Encounter (Signed)
Attempt to call patient-lmtcb.  Called Donita RN-HH who is in home with patient at current- reports patient has continued blood when using the restroom.  Reports this occurs every time she goes to the restroom, urinating or BM.  Denies continued constipation.  Reports bright red blood on paper and in toilet.  Denies dark or bloody stool.     Advised I would route to pharmacist for recommendations.  (see telephone note from 5/25)  Patient has INR recheck 6/1-HH RN reports she can check in the home so patient does not have to come to office.  Can also check sooner if needed.  Advised I would make pharmacist aware.

## 2017-05-01 NOTE — Telephone Encounter (Signed)
Talked to Rachael Davis for [facility]). Patient reported the spotting on Friday with significant improvement since. Will repeat INR on 05/02/17 with Home Care instead of 05/04/17, then will follow up in clinic on 05/08/17.  Also instructed to visit Urgent Care/ER if increase in bleeding symptoms or is dark tarry stools.

## 2017-05-01 NOTE — Telephone Encounter (Signed)
New message    Donita from [facility] is calling stating that pt is still having blood when she uses the restroom. She states she isn't sure if its from her vagina or bottom.

## 2017-05-02 ENCOUNTER — Ambulatory Visit (INDEPENDENT_AMBULATORY_CARE_PROVIDER_SITE_OTHER): Payer: Managed Care, Other (non HMO) | Admitting: Pharmacist Clinician (PhC)/ Clinical Pharmacy Specialist

## 2017-05-02 DIAGNOSIS — Z7901 Long term (current) use of anticoagulants: Secondary | ICD-10-CM

## 2017-05-02 DIAGNOSIS — Z951 Presence of aortocoronary bypass graft: Secondary | ICD-10-CM

## 2017-05-02 LAB — POCT INR: INR: 5.2

## 2017-05-02 MED ORDER — WARFARIN SODIUM 2.5 MG PO TABS: 2.5000 mg | ORAL_TABLET | Freq: Every day | ORAL | 1 refills | Status: DC

## 2017-05-03 ENCOUNTER — Telehealth: Payer: Self-pay | Admitting: Cardiology

## 2017-05-03 NOTE — Telephone Encounter (Signed)
Pt seen by Dr. Duke Salvia in hospital. She has a TCM on 6/5 w [facility]. No office VS on file.  Physical Therapist called bc she is working w patient in home, had some concern that patient's HR has been elevated when checked. HR 98 Monday, was 113 today. BP OK at 108/70 Patient expressed no acute concerns/distress, denies CP/fatigue per Phys Therapist's report. The patient was reporting some shoulder pain and some exertional SOB, but states these are baseline for her. Aware pain may cause BP and/or HR elevations.  Advised to keep appt as scheduled - I looked for earlier appts and there are none. Will see if any further advice from provider. We can call patient or PT back w recommendations. PT will conduct next home visit Monday.  Routed for review.

## 2017-05-03 NOTE — Telephone Encounter (Signed)
Beth PTA ( [facility] ) is calling to report an increase in heart rate since Monday . When took it on Monday it was 98 beats per min , Tuesday it was 100 beats per minute and today resting it is about 113 beats per minute . Please Call   Thanks

## 2017-05-04 DIAGNOSIS — I4891 Unspecified atrial fibrillation: Secondary | ICD-10-CM

## 2017-05-04 HISTORY — DX: Unspecified atrial fibrillation: I48.91

## 2017-05-04 NOTE — Telephone Encounter (Signed)
Returned call and left msg for [place] with advisements, requested call back.

## 2017-05-04 NOTE — Addendum Note (Signed)
Addendum  created 05/04/17 1449 by Vetta Couzens, MD   Sign clinical note    

## 2017-05-04 NOTE — Telephone Encounter (Signed)
Have pt start Lopressor 12.5 mg BID if she is not on this already.  [name] PA-C 05/04/2017 2:52 PM

## 2017-05-04 NOTE — Telephone Encounter (Signed)
Thank you for letting us know.  She will need an EKG at follow up.  If she is feeling well it is OK to wait until 6/5.

## 2017-05-08 ENCOUNTER — Ambulatory Visit (INDEPENDENT_AMBULATORY_CARE_PROVIDER_SITE_OTHER): Payer: Managed Care, Other (non HMO) | Admitting: Pharmacist Clinician (PhC)/ Clinical Pharmacy Specialist

## 2017-05-08 ENCOUNTER — Encounter: Payer: Self-pay | Admitting: Cardiology

## 2017-05-08 ENCOUNTER — Ambulatory Visit (INDEPENDENT_AMBULATORY_CARE_PROVIDER_SITE_OTHER): Payer: Managed Care, Other (non HMO) | Admitting: Cardiology

## 2017-05-08 VITALS — BP 148/99 | HR 109 | Ht 63.0 in | Wt 195.0 lb

## 2017-05-08 DIAGNOSIS — R0602 Shortness of breath: Secondary | ICD-10-CM | POA: Diagnosis not present

## 2017-05-08 DIAGNOSIS — I2121 ST elevation (STEMI) myocardial infarction involving left circumflex coronary artery: Secondary | ICD-10-CM | POA: Diagnosis not present

## 2017-05-08 DIAGNOSIS — Z951 Presence of aortocoronary bypass graft: Secondary | ICD-10-CM

## 2017-05-08 DIAGNOSIS — I2510 Atherosclerotic heart disease of native coronary artery without angina pectoris: Secondary | ICD-10-CM

## 2017-05-08 DIAGNOSIS — Z7901 Long term (current) use of anticoagulants: Secondary | ICD-10-CM | POA: Diagnosis not present

## 2017-05-08 DIAGNOSIS — E1121 Type 2 diabetes mellitus with diabetic nephropathy: Secondary | ICD-10-CM

## 2017-05-08 DIAGNOSIS — Z9861 Coronary angioplasty status: Secondary | ICD-10-CM | POA: Diagnosis not present

## 2017-05-08 DIAGNOSIS — E8779 Other fluid overload: Secondary | ICD-10-CM

## 2017-05-08 DIAGNOSIS — I10 Essential (primary) hypertension: Secondary | ICD-10-CM

## 2017-05-08 DIAGNOSIS — E8770 Fluid overload, unspecified: Secondary | ICD-10-CM | POA: Insufficient documentation

## 2017-05-08 LAB — POCT INR: INR: 2.8

## 2017-05-08 MED ORDER — METOPROLOL TARTRATE 25 MG PO TABS: 25.0000 mg | ORAL_TABLET | Freq: Two times a day (BID) | ORAL | 5 refills | Status: DC

## 2017-05-08 NOTE — Telephone Encounter (Signed)
Pt seen for visit today, changes to medications made by [facility]

## 2017-05-08 NOTE — Assessment & Plan Note (Signed)
Admitted 04/04/17

## 2017-05-08 NOTE — Addendum Note (Signed)
Addended by: Regis Bill B on: 05/08/2017 01:10 PM   Modules accepted: Orders

## 2017-05-08 NOTE — Assessment & Plan Note (Addendum)
LIMA-LAD, SVG-RI, SVG-OM, SVG-RCA with tissue MV replacement 04/12/17 

## 2017-05-08 NOTE — Patient Instructions (Signed)
Medication Instructions:  INCREASE YOUR FUROSEMIDE TO 40 MG TWICE A DAY FOR 3 DAYS ONLY THEN GO BACK TO ONCE A DAY   INCREASE YOUR METOPROLOL TO 25 MG TWICE A DAY   Labwork: NONE  Testing/Procedures: NONE  Follow-Up: Your physician recommends that you schedule a follow-up appointment in: DR [place] IN 6 WEEKS  KEEP APPOINTMENT WITH SURGEON AS SCHEDULED   If you need a refill on your cardiac medications before your next appointment, please call your pharmacy.

## 2017-05-08 NOTE — Assessment & Plan Note (Signed)
Coumadin Rx 

## 2017-05-08 NOTE — Assessment & Plan Note (Signed)
Elevated today

## 2017-05-08 NOTE — Assessment & Plan Note (Signed)
Pt has LE neuropathy

## 2017-05-08 NOTE — Assessment & Plan Note (Signed)
Pt is volume overloaded on exam with SOB, decreased breath sounds at bases and LE edema.

## 2017-05-08 NOTE — Progress Notes (Signed)
05/08/2017 Rachael Davis   16-Apr-1956  [id number]  Primary Physician Deatra James, MD Primary Cardiologist: Dr Duke Salvia  HPI:  61 y/o female with a history of DM and HTN admitted 04/19/17 with a STEMI. Cath revealed the CFX to be the acute vessel. She also had CTO of her RCA, high grade LAD and RI, and MR. It was decided to proceed with CABG and MVR. This was done 04/12/17. The pt was discharged 04/19/17 and is in the office today for follow up. She DOE. She has occasional palpitations- sounds like PVCs, single hard beat in her upper chest. She has had LE edema since discharge. She has trouble sleeping but no orthopnea.    Current Outpatient Prescriptions  Medication Sig Dispense Refill  . aspirin EC 325 MG EC tablet Take 1 tablet (325 mg total) by mouth daily. 30 tablet 0  . atorvastatin (LIPITOR) 20 MG tablet Take 1 tablet (20 mg total) by mouth daily at 6 PM. 30 tablet 3  . dapagliflozin propanediol (FARXIGA) 10 MG TABS tablet Take 10 mg by mouth daily.    . furosemide (LASIX) 40 MG tablet Take 1 tablet (40 mg total) by mouth daily. 30 tablet 3  . glipiZIDE (GLUCOTROL XL) 10 MG 24 hr tablet Take 10 mg by mouth 2 (two) times daily.    Marland Kitchen lisinopril (PRINIVIL,ZESTRIL) 10 MG tablet Take 1 tablet (10 mg total) by mouth daily. 30 tablet 3  . metFORMIN (GLUCOPHAGE) 1000 MG tablet Take 1,000 mg by mouth 2 (two) times daily with a meal.    . metoprolol tartrate (LOPRESSOR) 25 MG tablet Take 1 tablet (25 mg total) by mouth 2 (two) times daily. 60 tablet 5  . potassium chloride SA (K-DUR,KLOR-CON) 20 MEQ tablet Take 1 tablet (20 mEq total) by mouth 2 (two) times daily. 60 tablet 1  . saxagliptin HCl (ONGLYZA) 5 MG TABS tablet Take 5 mg by mouth daily.    . traMADol (ULTRAM) 50 MG tablet Take 1-2 tablets (50-100 mg total) by mouth every 4 (four) hours as needed for moderate pain. 30 tablet 0  . warfarin (COUMADIN) 2.5 MG tablet Take 1 tablet (2.5 mg total) by mouth daily. 30 tablet 1   No current  facility-administered medications for this visit.     Allergies  Allergen Reactions  . No Known Allergies     Past Medical History:  Diagnosis Date  . Diabetes (HCC)   . Hypertension     Social History   Social History  . Marital status: Married    Spouse name: N/A  . Number of children: N/A  . Years of education: N/A   Occupational History  . Not on file.   Social History Main Topics  . Smoking status: Never Smoker  . Smokeless tobacco: Never Used  . Alcohol use No  . Drug use: No  . Sexual activity: Not on file   Other Topics Concern  . Not on file   Social History Narrative  . No narrative on file     Family History  Problem Relation Age of Onset  . Heart attack Father 33     Review of Systems: General: negative for chills, fever, night sweats or weight changes.  Cardiovascular: negative for chest pain, dyspnea on exertion, edema, orthopnea, palpitations, paroxysmal nocturnal dyspnea  Dermatological: negative for rash Respiratory: negative for cough or wheezing Urologic: negative for hematuria Abdominal: negative for nausea, vomiting, diarrhea, bright red blood per rectum, melena, or hematemesis Neurologic: negative for  visual changes, syncope, or dizziness All other systems reviewed and are otherwise negative except as noted above.    Blood pressure (!) 148/99, pulse (!) 109, height 5\' 3"  (1.6 m), weight 195 lb (88.5 kg).  General appearance: alert, cooperative, no distress and mildly obese Neck: no JVD Lungs: decreased breath sounds both bases Heart: regular rate and rhythm and soft sytsolic murmur Extremities: 1-2+ LE edema Skin: cool, warm and dry Neurologic: Grossly normal  EKG NSR, 1st degree AVB, ST-110, PVCs, and poor anterior RW (new)  ASSESSMENT AND PLAN:   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) Admitted 04/04/17   S/P CABG x 4 LIMA-LAD, SVG-RI, SVG-OM, SVG-RCA with tissue MV replacement 04/15/17  Long term (current)  use of anticoagulants [Z79.01] Coumadin Rx  Diabetes mellitus with nephropathy (HCC) Pt has LE neuropathy  Volume overload Pt is volume overloaded on exam with SOB, decreased breath sounds at bases and LE edema.    PLAN  I suggested Rachael Davis increase her Lasix to 40 mg BID x 3 days. I also increased her Lopressor to 25 mg BID. I am also ordering an echo- ? Post op pericardial effusion. She has an appointment with Dr PVT on 6/15.  [name] PA-C 05/08/2017 12:28 PM

## 2017-05-09 ENCOUNTER — Telehealth: Payer: Self-pay | Admitting: Cardiovascular Disease

## 2017-05-09 NOTE — Telephone Encounter (Signed)
Patient s/p CABG & MVR Rachael Davis, [place] 7/8   [facility] needs parameters for HR for her PT sessions  Routed to MD

## 2017-05-09 NOTE — Telephone Encounter (Signed)
New message   [facility] calling need guidelines on heart rate to work within PT session.

## 2017-05-11 ENCOUNTER — Ambulatory Visit (HOSPITAL_COMMUNITY): Payer: Managed Care, Other (non HMO) | Attending: Cardiovascular Disease

## 2017-05-11 ENCOUNTER — Other Ambulatory Visit: Payer: Self-pay

## 2017-05-11 DIAGNOSIS — E669 Obesity, unspecified: Secondary | ICD-10-CM | POA: Insufficient documentation

## 2017-05-11 DIAGNOSIS — Z951 Presence of aortocoronary bypass graft: Secondary | ICD-10-CM | POA: Diagnosis not present

## 2017-05-11 DIAGNOSIS — Z9889 Other specified postprocedural states: Secondary | ICD-10-CM | POA: Insufficient documentation

## 2017-05-11 DIAGNOSIS — I252 Old myocardial infarction: Secondary | ICD-10-CM | POA: Insufficient documentation

## 2017-05-11 DIAGNOSIS — R0602 Shortness of breath: Secondary | ICD-10-CM | POA: Diagnosis not present

## 2017-05-11 DIAGNOSIS — Z953 Presence of xenogenic heart valve: Secondary | ICD-10-CM | POA: Diagnosis not present

## 2017-05-11 DIAGNOSIS — E119 Type 2 diabetes mellitus without complications: Secondary | ICD-10-CM | POA: Insufficient documentation

## 2017-05-11 DIAGNOSIS — I119 Hypertensive heart disease without heart failure: Secondary | ICD-10-CM | POA: Diagnosis not present

## 2017-05-11 DIAGNOSIS — Z6834 Body mass index (BMI) 34.0-34.9, adult: Secondary | ICD-10-CM | POA: Insufficient documentation

## 2017-05-14 ENCOUNTER — Ambulatory Visit (INDEPENDENT_AMBULATORY_CARE_PROVIDER_SITE_OTHER): Payer: Managed Care, Other (non HMO) | Admitting: Pharmacist

## 2017-05-14 DIAGNOSIS — Z951 Presence of aortocoronary bypass graft: Secondary | ICD-10-CM

## 2017-05-14 DIAGNOSIS — Z7901 Long term (current) use of anticoagulants: Secondary | ICD-10-CM

## 2017-05-14 LAB — PROTIME-INR: INR: 1.5 — AB (ref <=1.1)

## 2017-05-16 ENCOUNTER — Ambulatory Visit: Payer: Self-pay | Admitting: Cardiothoracic Surgery

## 2017-05-17 ENCOUNTER — Ambulatory Visit: Payer: Self-pay

## 2017-05-18 NOTE — Telephone Encounter (Signed)
HR should stay below 134.

## 2017-05-21 ENCOUNTER — Ambulatory Visit (INDEPENDENT_AMBULATORY_CARE_PROVIDER_SITE_OTHER): Payer: Managed Care, Other (non HMO) | Admitting: Pharmacist Clinician (PhC)/ Clinical Pharmacy Specialist

## 2017-05-21 ENCOUNTER — Telehealth: Payer: Self-pay

## 2017-05-21 DIAGNOSIS — Z951 Presence of aortocoronary bypass graft: Secondary | ICD-10-CM

## 2017-05-21 DIAGNOSIS — Z7901 Long term (current) use of anticoagulants: Secondary | ICD-10-CM

## 2017-05-21 LAB — POCT INR: INR: 2.2

## 2017-05-21 NOTE — Telephone Encounter (Signed)
[facility] nurse-Jill, called about Rachael Davis developing a cough since starting the medication Lisinopril.  I recommend that she call her Cardiologist with this information and I will inform Dr. Donata Clay.  She is scheduled to see him on 05/24/2017.

## 2017-05-23 ENCOUNTER — Encounter (HOSPITAL_COMMUNITY): Payer: Self-pay | Admitting: General Practice

## 2017-05-23 ENCOUNTER — Encounter: Payer: Self-pay | Admitting: Physician Assistant

## 2017-05-23 ENCOUNTER — Ambulatory Visit (INDEPENDENT_AMBULATORY_CARE_PROVIDER_SITE_OTHER): Payer: 59 | Admitting: Physician Assistant

## 2017-05-23 ENCOUNTER — Other Ambulatory Visit: Payer: Self-pay | Admitting: Surgical

## 2017-05-23 ENCOUNTER — Ambulatory Visit
Admission: RE | Admit: 2017-05-23 | Discharge: 2017-05-23 | Disposition: A | Discharge status: Home / Self Care | Payer: Managed Care, Other (non HMO) | Source: Ambulatory Visit | Attending: Cardiothoracic Surgery | Admitting: Cardiothoracic Surgery

## 2017-05-23 ENCOUNTER — Other Ambulatory Visit: Payer: Self-pay | Admitting: Cardiothoracic Surgery

## 2017-05-23 ENCOUNTER — Inpatient Hospital Stay (HOSPITAL_COMMUNITY)
Admission: AD | Admit: 2017-05-23 | Discharge: 2017-05-28 | DRG: 293 | Disposition: A | Discharge status: Home Health | Payer: Managed Care, Other (non HMO) | Source: Ambulatory Visit | Attending: Cardiothoracic Surgery | Admitting: Cardiothoracic Surgery

## 2017-05-23 VITALS — BP 126/85 | HR 110 | Resp 20 | Ht 63.0 in | Wt 185.0 lb

## 2017-05-23 DIAGNOSIS — I252 Old myocardial infarction: Secondary | ICD-10-CM | POA: Diagnosis not present

## 2017-05-23 DIAGNOSIS — I440 Atrioventricular block, first degree: Secondary | ICD-10-CM | POA: Diagnosis present

## 2017-05-23 DIAGNOSIS — E875 Hyperkalemia: Secondary | ICD-10-CM | POA: Diagnosis present

## 2017-05-23 DIAGNOSIS — I110 Hypertensive heart disease with heart failure: Principal | ICD-10-CM | POA: Diagnosis present

## 2017-05-23 DIAGNOSIS — E785 Hyperlipidemia, unspecified: Secondary | ICD-10-CM | POA: Diagnosis present

## 2017-05-23 DIAGNOSIS — Z7982 Long term (current) use of aspirin: Secondary | ICD-10-CM | POA: Diagnosis not present

## 2017-05-23 DIAGNOSIS — Z951 Presence of aortocoronary bypass graft: Secondary | ICD-10-CM

## 2017-05-23 DIAGNOSIS — E1121 Type 2 diabetes mellitus with diabetic nephropathy: Secondary | ICD-10-CM | POA: Diagnosis present

## 2017-05-23 DIAGNOSIS — F419 Anxiety disorder, unspecified: Secondary | ICD-10-CM | POA: Diagnosis present

## 2017-05-23 DIAGNOSIS — I2510 Atherosclerotic heart disease of native coronary artery without angina pectoris: Secondary | ICD-10-CM | POA: Diagnosis present

## 2017-05-23 DIAGNOSIS — E1140 Type 2 diabetes mellitus with diabetic neuropathy, unspecified: Secondary | ICD-10-CM | POA: Diagnosis present

## 2017-05-23 DIAGNOSIS — Z7901 Long term (current) use of anticoagulants: Secondary | ICD-10-CM | POA: Diagnosis not present

## 2017-05-23 DIAGNOSIS — I255 Ischemic cardiomyopathy: Secondary | ICD-10-CM | POA: Diagnosis present

## 2017-05-23 DIAGNOSIS — Z7984 Long term (current) use of oral hypoglycemic drugs: Secondary | ICD-10-CM | POA: Diagnosis not present

## 2017-05-23 DIAGNOSIS — Z952 Presence of prosthetic heart valve: Secondary | ICD-10-CM

## 2017-05-23 DIAGNOSIS — I5043 Acute on chronic combined systolic (congestive) and diastolic (congestive) heart failure: Secondary | ICD-10-CM | POA: Diagnosis present

## 2017-05-23 DIAGNOSIS — J90 Pleural effusion, not elsewhere classified: Secondary | ICD-10-CM

## 2017-05-23 DIAGNOSIS — I480 Paroxysmal atrial fibrillation: Secondary | ICD-10-CM | POA: Diagnosis present

## 2017-05-23 DIAGNOSIS — Z09 Encounter for follow-up examination after completed treatment for conditions other than malignant neoplasm: Secondary | ICD-10-CM

## 2017-05-23 DIAGNOSIS — Z79899 Other long term (current) drug therapy: Secondary | ICD-10-CM

## 2017-05-23 DIAGNOSIS — I340 Nonrheumatic mitral (valve) insufficiency: Secondary | ICD-10-CM | POA: Diagnosis present

## 2017-05-23 DIAGNOSIS — I4891 Unspecified atrial fibrillation: Secondary | ICD-10-CM | POA: Diagnosis not present

## 2017-05-23 DIAGNOSIS — Z8249 Family history of ischemic heart disease and other diseases of the circulatory system: Secondary | ICD-10-CM

## 2017-05-23 DIAGNOSIS — I360 Nonrheumatic tricuspid (valve) stenosis: Secondary | ICD-10-CM | POA: Diagnosis not present

## 2017-05-23 DIAGNOSIS — I493 Ventricular premature depolarization: Secondary | ICD-10-CM | POA: Diagnosis not present

## 2017-05-23 DIAGNOSIS — R0602 Shortness of breath: Secondary | ICD-10-CM

## 2017-05-23 HISTORY — DX: Heart failure, unspecified: I50.9

## 2017-05-23 HISTORY — DX: Unspecified atrial fibrillation: I48.91

## 2017-05-23 LAB — COMPREHENSIVE METABOLIC PANEL
ALT: 23 U/L (ref 14–54)
AST: 19 U/L (ref 15–41)
Albumin: 3.7 g/dL (ref 3.5–5.0)
Alkaline Phosphatase: 111 U/L (ref 38–126)
Anion gap: 13 (ref 5–15)
BUN: 21 mg/dL — ABNORMAL HIGH (ref 6–20)
CO2: 24 mmol/L (ref 22–32)
Calcium: 9.4 mg/dL (ref 8.9–10.3)
Chloride: 104 mmol/L (ref 101–111)
Creatinine, Ser: 1.25 mg/dL — ABNORMAL HIGH (ref 0.44–1.00)
GFR calc Af Amer: 52 mL/min — ABNORMAL LOW (ref >60)
GFR calc non Af Amer: 45 mL/min — ABNORMAL LOW (ref >60)
Glucose, Bld: 72 mg/dL (ref 65–99)
Potassium: 3.4 mmol/L — ABNORMAL LOW (ref 3.5–5.1)
Sodium: 141 mmol/L (ref 135–145)
Total Bilirubin: 0.6 mg/dL (ref 0.3–1.2)
Total Protein: 7.9 g/dL (ref 6.5–8.1)

## 2017-05-23 LAB — CBC WITH DIFFERENTIAL/PLATELET
Basophils Absolute: 0 10*3/uL (ref 0.0–0.1)
Basophils Relative: 0 %
Eosinophils Absolute: 0.1 10*3/uL (ref 0.0–0.7)
Eosinophils Relative: 1 %
HCT: 40.1 % (ref 36.0–46.0)
Hemoglobin: 12.6 g/dL (ref 12.0–15.0)
Lymphocytes Relative: 23 %
Lymphs Abs: 2.4 10*3/uL (ref 0.7–4.0)
MCH: 23.5 pg — ABNORMAL LOW (ref 26.0–34.0)
MCHC: 31.4 g/dL (ref 30.0–36.0)
MCV: 74.8 fL — ABNORMAL LOW (ref 78.0–100.0)
Monocytes Absolute: 0.6 10*3/uL (ref 0.1–1.0)
Monocytes Relative: 6 %
Neutro Abs: 7.3 10*3/uL (ref 1.7–7.7)
Neutrophils Relative %: 70 %
Platelets: 233 10*3/uL (ref 150–400)
RBC: 5.36 MIL/uL — ABNORMAL HIGH (ref 3.87–5.11)
RDW: 17.3 % — ABNORMAL HIGH (ref 11.5–15.5)
WBC: 10.3 10*3/uL (ref 4.0–10.5)

## 2017-05-23 LAB — PROTIME-INR
INR: 2.25
Prothrombin Time: 25.2 seconds — ABNORMAL HIGH (ref 11.4–15.2)

## 2017-05-23 LAB — MAGNESIUM: Magnesium: 1.7 mg/dL (ref 1.7–2.4)

## 2017-05-23 LAB — BRAIN NATRIURETIC PEPTIDE: B Natriuretic Peptide: 850.4 pg/mL — ABNORMAL HIGH (ref 0.0–100.0)

## 2017-05-23 LAB — GLUCOSE, CAPILLARY
Glucose-Capillary: 65 mg/dL (ref 65–99)
Glucose-Capillary: 142 mg/dL — ABNORMAL HIGH (ref 65–99)

## 2017-05-23 LAB — APTT: aPTT: 44 seconds — ABNORMAL HIGH (ref 24–36)

## 2017-05-23 LAB — TSH: TSH: 0.49 u[IU]/mL (ref 0.350–4.500)

## 2017-05-23 MED ORDER — METFORMIN HCL 500 MG PO TABS
1000.0000 mg | ORAL_TABLET | Freq: Two times a day (BID) | ORAL | Status: DC
  Administered 2017-05-24 – 2017-05-28 (×8): 1000 mg via ORAL
  Filled 2017-05-23 (×10): qty 2

## 2017-05-23 MED ORDER — LINAGLIPTIN 5 MG PO TABS
5.0000 mg | ORAL_TABLET | Freq: Every day | ORAL | Status: DC
  Administered 2017-05-23 – 2017-05-26 (×4): 5 mg via ORAL
  Filled 2017-05-23 (×4): qty 1

## 2017-05-23 MED ORDER — ACETAMINOPHEN 325 MG PO TABS: 650.0000 mg | ORAL_TABLET | ORAL | Status: DC | PRN

## 2017-05-23 MED ORDER — ONDANSETRON HCL 4 MG/2ML IJ SOLN: 4.0000 mg | Freq: Four times a day (QID) | INTRAMUSCULAR | Status: DC | PRN

## 2017-05-23 MED ORDER — INSULIN ASPART 100 UNIT/ML ~~LOC~~ SOLN
0.0000 [IU] | Freq: Three times a day (TID) | SUBCUTANEOUS | Status: DC
  Administered 2017-05-25: 3 [IU] via SUBCUTANEOUS
  Administered 2017-05-25: 2 [IU] via SUBCUTANEOUS
  Administered 2017-05-25 – 2017-05-26 (×3): 3 [IU] via SUBCUTANEOUS
  Administered 2017-05-26: 2 [IU] via SUBCUTANEOUS
  Administered 2017-05-27: 3 [IU] via SUBCUTANEOUS
  Administered 2017-05-27: 2 [IU] via SUBCUTANEOUS

## 2017-05-23 MED ORDER — SODIUM CHLORIDE 0.9% FLUSH
3.0000 mL | Freq: Two times a day (BID) | INTRAVENOUS | Status: DC
  Administered 2017-05-23 – 2017-05-27 (×7): 3 mL via INTRAVENOUS

## 2017-05-23 MED ORDER — SODIUM CHLORIDE 0.9% FLUSH: 3.0000 mL | INTRAVENOUS | Status: DC | PRN

## 2017-05-23 MED ORDER — SODIUM CHLORIDE 0.9 % IV SOLN: 250.0000 mL | INTRAVENOUS | Status: DC | PRN

## 2017-05-23 MED ORDER — FUROSEMIDE 10 MG/ML IJ SOLN
40.0000 mg | Freq: Every day | INTRAMUSCULAR | Status: DC
  Administered 2017-05-23: 40 mg via INTRAVENOUS
  Filled 2017-05-23: qty 4

## 2017-05-23 NOTE — Progress Notes (Signed)
[address]       [name] [address]             [phone]      Rachael Davis is a 61 y.o. female patient who underwent a CABG x 4 and Mitral valve replacement on 5/10. She is here for her routine post-op appointment.   1. S/P CABG x 4 and MVR   Past Medical History:  Diagnosis Date  . Diabetes (HCC)   . Hypertension    Scheduled Meds: Current Outpatient Prescriptions on File Prior to Visit  Medication Sig Dispense Refill  . aspirin EC 325 MG EC tablet Take 1 tablet (325 mg total) by mouth daily. 30 tablet 0  . atorvastatin (LIPITOR) 20 MG tablet Take 1 tablet (20 mg total) by mouth daily at 6 PM. 30 tablet 3  . dapagliflozin propanediol (FARXIGA) 10 MG TABS tablet Take 10 mg by mouth daily.    . furosemide (LASIX) 40 MG tablet Take 1 tablet (40 mg total) by mouth daily. 30 tablet 3  . glipiZIDE (GLUCOTROL XL) 10 MG 24 hr tablet Take 10 mg by mouth 2 (two) times daily.    Marland Kitchen lisinopril (PRINIVIL,ZESTRIL) 10 MG tablet Take 1 tablet (10 mg total) by mouth daily. 30 tablet 3  . metFORMIN (GLUCOPHAGE) 1000 MG tablet Take 1,000 mg by mouth 2 (two) times daily with a meal.    . metoprolol tartrate (LOPRESSOR) 25 MG tablet Take 1 tablet (25 mg total) by mouth 2 (two) times daily. 60 tablet 5  . potassium chloride SA (K-DUR,KLOR-CON) 20 MEQ tablet Take 1 tablet (20 mEq total) by mouth 2 (two) times daily. 60 tablet 1  . saxagliptin HCl (ONGLYZA) 5 MG TABS tablet Take 5 mg by mouth daily.    . traMADol (ULTRAM) 50 MG tablet Take 1-2 tablets (50-100 mg total) by mouth every 4 (four) hours as needed for moderate pain. 30 tablet 0  . warfarin (COUMADIN) 2.5 MG tablet Take 1 tablet (2.5 mg total) by mouth daily. 30 tablet 1   No current facility-administered medications on file prior to visit.     Allergies  Allergen Reactions  . No Known Allergies     Blood pressure 126/85, pulse (!) 110, resp. rate 20, height 5\' 3"  (1.6 m), weight 83.9 kg (185 lb), SpO2 99  %.  Subjective: The patient is here for her routine post-op visit.   Objective: Cor: irregularly irregular Pulm: diminished in the lower lobes Abd: soft, non-tender Ext: 2+ pitting edema Wound: incisions without drainage. Clean and intact. Sternum with some instability with palpation  CLINICAL DATA:  Shortness of Breath  EXAM: CHEST  2 VIEW  COMPARISON:  Apr 17, 2017  FINDINGS: There is a new small right pleural effusion with patchy consolidation in the right base. There is persistent airspace consolidation in the left base with left effusion, stable and slightly larger than the effusion on the right. Lungs elsewhere clear. There is cardiomegaly with mild pulmonary venous hypertension. No adenopathy. Patient is status post mitral valve replacement and coronary artery bypass grafting.  IMPRESSION: Bilateral pleural effusions, stable on the left and new on the right compared to 1 month prior. Patchy consolidation in both lung bases, slightly increased on the left and new on the right compared to 1 month prior. Stable pulmonary vascular congestion. The cardiac silhouette in particular is stable compared to 1 month prior. No adenopathy evident.   Electronically Signed   By: Chrissie Noa  Margarita Grizzle III M.D.   On: 05/23/2017 14:57  Of note it appears the fifth sternal wire may have pulled through compared to her 5/15 xray.   Assessment & Plan:  Admitting the patient for shortness of breath and new onset atrial fibrillation s/p CABG x 4 and MVR. Refer to inpatient H and P.   Sharlene Dory 05/23/2017

## 2017-05-23 NOTE — H&P (Signed)
[address]       [place] [address]             [phone]        Rachael Davis [facility] Medical Record #[id number] Date of Birth: 03-27-1956  Referring: No ref. provider found Primary Care: Deatra James, MD  Chief Complaint:  Shortness of breath, new onset atrial fibrillation  History of Present Illness:     Rachael Davis is a 61 year old female with a past surgical history of CABG 4 and mitral valve replacement with Dr. Donata Clay on 04/12/2017. She has a past medical history of diabetes mellitus, hypertension, hyperlipidemia, diabetic neuropathy, and congestive heart failure with a left ventricular ejection fraction of 40% preop. She presented to the office today for a routine 4 week follow-up appointment. She states that her shortness of breath has remained the same despite being on daily diuretics. She is very fatigued and is sleeping very little night. She shares she's had anxiety especially at bedtime and feels that her heart is beating hard. Sometimes she can feel her heart beat in her throat and chest. Her anxiety at night is mostly due to her shortness of breath and prominent heart beat. She required an aggressive diuretic regimen while inpatient after surgery but continues to have 2-3+ pitting pedal and lower leg edema in bilateral extremities. Her chest x-ray showed bilateral pleural effusions. Also, her fifth sternal wire may have pulled through the bone. She does have some instability on palpation of her chest. The patient shares she has been coughing quite frequently and thinks that it may be due to her lisinopril. She is on chronic anticoagulation and her most recent INR is 2.2. Her Coumadin was initiated due to her mitral valve replacement however, she was found to have an irregular heartbeat on physical exam today. We obtained a rhythm strip in the office which showed possible atrial fibrillation with PVCs. She did not have a history of atrial fibrillation  while inpatient post surgery. We have consulted Heart failure to assist in medical management of the patient.    Current Activity/ Functional Status: Uses an assist device. Becomes short of breath walking short distances.    Zubrod Score: At the time of surgery this patient's most appropriate activity status/level should be described as: []     0    Normal activity, no symptoms [x]     1    Restricted in physical strenuous activity but ambulatory, able to do out light work []     2    Ambulatory and capable of self care, unable to do work activities, up and about                 more than 50%  Of the time                            []     3    Only limited self care, in bed greater than 50% of waking hours []     4    Completely disabled, no self care, confined to bed or chair []     5    Moribund  Past Medical History:  Diagnosis Date  . Diabetes (HCC)   . Hypertension     Past Surgical History:  Procedure Laterality Date  . CORONARY ARTERY BYPASS GRAFT N/A 04/12/2017   Procedure: CORONARY ARTERY BYPASS GRAFTING (CABG), ON PUMP, TIMES FOUR, USING LEFT INTERNAL  MAMMARY ARTERY AND ENDOSCOPICALLY HARVESTED RIGHT GREATER SAPHENOUS VEIN;  Surgeon: Kerin Perna, MD;  Location: [facility] OR;  Service: Open Heart Surgery;  Laterality: N/A;  LIMA to LAD; SVG to OM; SVG to Ramus; SVG to Right  . LEFT HEART CATH AND CORONARY ANGIOGRAPHY N/A 04/04/2017   Procedure: Left Heart Cath and Coronary Angiography;  Surgeon: Damondre Pfeifle M [name], MD;  Location: [facility] INVASIVE CV LAB;  Service: Cardiovascular;  Laterality: N/A;  . MITRAL VALVE REPAIR N/A 04/12/2017   Procedure: MITRAL VALVE REPLACEMENT (MVR);  Surgeon: Donata Clay, Theron Arista, MD;  Location: [facility] OR;  Service: Open Heart Surgery;  Laterality: N/A;  Using a 25mm Magna Ease Mitral Pericardial Bioprosthesis  . TEE WITHOUT CARDIOVERSION N/A 04/12/2017   Procedure: TRANSESOPHAGEAL ECHOCARDIOGRAM (TEE);  Surgeon: Donata Clay, Theron Arista, MD;  Location: [facility] OR;  Service: Open Heart  Surgery;  Laterality: N/A;  . TUBAL LIGATION      History  Smoking Status  . Never Smoker  Smokeless Tobacco  . Never Used    History  Alcohol Use No    Social History   Social History  . Marital status: Married    Spouse name: N/A  . Number of children: N/A  . Years of education: N/A   Occupational History  . Not on file.   Social History Main Topics  . Smoking status: Never Smoker  . Smokeless tobacco: Never Used  . Alcohol use No  . Drug use: No  . Sexual activity: Not on file   Other Topics Concern  . Not on file   Social History Narrative  . No narrative on file    Allergies  Allergen Reactions  . No Known Allergies     No current facility-administered medications for this encounter.     Prescriptions Prior to Admission  Medication Sig Dispense Refill Last Dose  . aspirin EC 325 MG EC tablet Take 1 tablet (325 mg total) by mouth daily. 30 tablet 0 Taking  . atorvastatin (LIPITOR) 20 MG tablet Take 1 tablet (20 mg total) by mouth daily at 6 PM. 30 tablet 3 Taking  . dapagliflozin propanediol (FARXIGA) 10 MG TABS tablet Take 10 mg by mouth daily.   Taking  . glipiZIDE (GLUCOTROL XL) 10 MG 24 hr tablet Take 10 mg by mouth 2 (two) times daily.   Taking  . metFORMIN (GLUCOPHAGE) 1000 MG tablet Take 1,000 mg by mouth 2 (two) times daily with a meal.   Taking  . potassium chloride SA (K-DUR,KLOR-CON) 20 MEQ tablet Take 1 tablet (20 mEq total) by mouth 2 (two) times daily. 60 tablet 1 Taking  . saxagliptin HCl (ONGLYZA) 5 MG TABS tablet Take 5 mg by mouth daily.   Taking  . traMADol (ULTRAM) 50 MG tablet Take 1-2 tablets (50-100 mg total) by mouth every 4 (four) hours as needed for moderate pain. 30 tablet 0 Taking  . warfarin (COUMADIN) 2.5 MG tablet Take 1 tablet (2.5 mg total) by mouth daily. 30 tablet 1 Taking    Family History  Problem Relation Age of Onset  . Heart attack Father 22     Review of Systems:  Pertinent items are noted in HPI.       Cardiac Review of Systems: Y or N  Chest Pain [ N   ]  Resting SOB [   ] Exertional SOB  [ Y ]  Orthopnea [ Y ]   Pedal Edema [ Y  ]    Palpitations [Y  ] Syncope  [  N ]   Presyncope [   ]  General Review of Systems: [Y] = yes [  ]=no Constitional: recent weight change [  ]; anorexia [  ]; fatigue [  ]; nausea [  ]; night sweats [  ]; fever [  ]; or chills [  ]                                                               Dental: poor dentition[  ]; Last Dentist visit:   Eye : blurred vision [  ]; diplopia [   ]; vision changes [  ];  Amaurosis fugax[  ]; Resp: cough [ Y ];  wheezing[  ];  hemoptysis[  ]; shortness of breath[ Y ]; paroxysmal nocturnal dyspnea[  ]; dyspnea on exertion[ Y ]; or orthopnea[ Y ];  GI:  gallstones[  ], vomiting[N  ];  dysphagia[  ]; melena[  ];  hematochezia [  ]; heartburn[  ];   Hx of  Colonoscopy[  ]; GU: kidney stones [  ]; hematuria[  ];   dysuria [  ];  nocturia[  ];  history of     obstruction [  ]; urinary frequency [  ]             Skin: rash, swelling[  ];, hair loss[  ];  peripheral edema[ Y ];  or itching[  ]; Musculosketetal: myalgias[  ];  joint swelling[  ];  joint erythema[  ];  joint pain[  ];  back pain[  ];  Heme/Lymph: bruising[  ];  bleeding[  ];  anemia[  ];  Neuro: TIA[  ];  headaches[  ];  stroke[  ];  vertigo[  ];  seizures[  ];   paresthesias[  ];  difficulty walking[  ];  Psych:depression[  ]; anxiety[ Y ];  Endocrine: diabetes[ Y ];  thyroid dysfunction[ N ];  Immunizations: Flu [  ]; Pneumococcal[  ];  Other:  Physical Exam: Cor: irregularly irregular, no murmur Pulm: diminished in the lower lobes Abd: soft, non-tender Ext: 2+ pitting edema Wound: incisions without drainage. Clean and intact. Sternum with some instability with palpation  Diagnostic Studies & Laboratory data: pending     Recent Radiology Findings:   Dg Chest 2 View  Result Date: 05/23/2017 CLINICAL DATA:  Shortness of Breath EXAM: CHEST  2 VIEW COMPARISON:  Apr 17, 2017 FINDINGS: There is a new small right pleural effusion with patchy consolidation in the right base. There is persistent airspace consolidation in the left base with left effusion, stable and slightly larger than the effusion on the right. Lungs elsewhere clear. There is cardiomegaly with mild pulmonary venous hypertension. No adenopathy. Patient is status post mitral valve replacement and coronary artery bypass grafting. IMPRESSION: Bilateral pleural effusions, stable on the left and new on the right compared to 1 month prior. Patchy consolidation in both lung bases, slightly increased on the left and new on the right compared to 1 month prior. Stable pulmonary vascular congestion. The cardiac silhouette in particular is stable compared to 1 month prior. No adenopathy evident. Electronically Signed   By: Bretta Bang III M.D.   On: 05/23/2017 14:57     I have independently reviewed the above radiologic studies.  Recent Lab Findings:  Lab Results  Component Value Date   WBC 12.5 (H) 04/18/2017   HGB 10.3 (L) 04/18/2017   HCT 32.2 (L) 04/18/2017   PLT 261 04/18/2017   GLUCOSE 191 (H) 04/19/2017   CHOL 168 04/06/2017   TRIG 67 04/06/2017   HDL 48 04/06/2017   LDLCALC 107 (H) 04/06/2017   ALT 41 04/16/2017   AST 45 (H) 04/16/2017   NA 137 04/19/2017   K 3.9 04/19/2017   CL 99 (L) 04/19/2017   CREATININE 1.01 (H) 04/19/2017   BUN 20 04/19/2017   CO2 29 04/19/2017   TSH 0.264 (L) 04/06/2017   INR 2.2 05/21/2017   HGBA1C 8.2 (H) 04/06/2017      Assessment / Plan:   We have admitted the patient for proper rhythm evaluation and diuretic management for heart failure. The patient was discussed with Dr. Dorris Fetch and Dr. Tyrone Sage.  Heart failure was consulted for assistance in medical management. We will obtain routine labs, a 12 lead EKG and change her lasix to IV. Strict Is and Os and monitor daily weights.      I  spent 30 minutes counseling the patient face to face and 50%  or more the  time was spent in counseling and coordination of care. The total time spent in the appointment was 60 minutes.    Jari Favre, PA-C 05/23/2017 4:47 PM   Patient examined chest x-ray reviewed Sternal incision well-healed Breath sounds reduced on left side We'll order ultrasound guided left thoracentesis Echocardiogram ordered to assess LV function and mitral valve function Patient needs more aggressive diuresis with IV dosing and potassium supplementation\ Preop dose of metoprolol has been resumed  Kathlee Nations trigt M.D.

## 2017-05-23 NOTE — Telephone Encounter (Signed)
Patient has been discharged from [facility] PT services per phone conversation w/Beth

## 2017-05-23 NOTE — Patient Instructions (Signed)
Admit the patient to 2W

## 2017-05-24 ENCOUNTER — Inpatient Hospital Stay (HOSPITAL_COMMUNITY): Payer: Managed Care, Other (non HMO)

## 2017-05-24 ENCOUNTER — Ambulatory Visit: Payer: Self-pay

## 2017-05-24 DIAGNOSIS — I493 Ventricular premature depolarization: Secondary | ICD-10-CM

## 2017-05-24 DIAGNOSIS — I360 Nonrheumatic tricuspid (valve) stenosis: Secondary | ICD-10-CM

## 2017-05-24 DIAGNOSIS — I5043 Acute on chronic combined systolic (congestive) and diastolic (congestive) heart failure: Secondary | ICD-10-CM

## 2017-05-24 LAB — BASIC METABOLIC PANEL
Anion gap: 12 (ref 5–15)
BUN: 21 mg/dL — ABNORMAL HIGH (ref 6–20)
CO2: 26 mmol/L (ref 22–32)
Calcium: 9 mg/dL (ref 8.9–10.3)
Chloride: 104 mmol/L (ref 101–111)
Creatinine, Ser: 1.23 mg/dL — ABNORMAL HIGH (ref 0.44–1.00)
GFR calc Af Amer: 53 mL/min — ABNORMAL LOW (ref >60)
GFR calc non Af Amer: 46 mL/min — ABNORMAL LOW (ref >60)
Glucose, Bld: 82 mg/dL (ref 65–99)
Potassium: 3.5 mmol/L (ref 3.5–5.1)
Sodium: 142 mmol/L (ref 135–145)

## 2017-05-24 LAB — GLUCOSE, CAPILLARY
Glucose-Capillary: 94 mg/dL (ref 65–99)
Glucose-Capillary: 157 mg/dL — ABNORMAL HIGH (ref 65–99)
Glucose-Capillary: 119 mg/dL — ABNORMAL HIGH (ref 65–99)
Glucose-Capillary: 230 mg/dL — ABNORMAL HIGH (ref 65–99)

## 2017-05-24 LAB — ECHOCARDIOGRAM COMPLETE
Height: 63 in
Weight: 2878.4 oz

## 2017-05-24 MED ORDER — LISINOPRIL 10 MG PO TABS: 10.0000 mg | ORAL_TABLET | Freq: Every day | ORAL | Status: DC

## 2017-05-24 MED ORDER — WARFARIN SODIUM 7.5 MG PO TABS: 3.7500 mg | ORAL_TABLET | ORAL | Status: DC

## 2017-05-24 MED ORDER — FUROSEMIDE 10 MG/ML IJ SOLN
40.0000 mg | Freq: Two times a day (BID) | INTRAMUSCULAR | Status: DC
  Administered 2017-05-24 – 2017-05-27 (×7): 40 mg via INTRAVENOUS
  Filled 2017-05-24 (×8): qty 4

## 2017-05-24 MED ORDER — POTASSIUM CHLORIDE 20 MEQ/15ML (10%) PO SOLN: 40.0000 meq | Freq: Every day | ORAL | Status: DC

## 2017-05-24 MED ORDER — AMIODARONE LOAD VIA INFUSION
150.0000 mg | Freq: Once | INTRAVENOUS | Status: AC
  Administered 2017-05-24: 150 mg via INTRAVENOUS
  Filled 2017-05-24: qty 83.34

## 2017-05-24 MED ORDER — WARFARIN SODIUM 2.5 MG PO TABS
2.5000 mg | ORAL_TABLET | Freq: Every day | ORAL | Status: DC
  Administered 2017-05-24 – 2017-05-27 (×4): 2.5 mg via ORAL
  Filled 2017-05-24 (×4): qty 1

## 2017-05-24 MED ORDER — METOPROLOL TARTRATE 25 MG PO TABS
25.0000 mg | ORAL_TABLET | Freq: Two times a day (BID) | ORAL | Status: DC
Start: 2017-05-24 — End: 2017-05-24

## 2017-05-24 MED ORDER — MAGNESIUM OXIDE 400 (241.3 MG) MG PO TABS
400.0000 mg | ORAL_TABLET | Freq: Two times a day (BID) | ORAL | Status: DC
  Administered 2017-05-24 – 2017-05-28 (×9): 400 mg via ORAL
  Filled 2017-05-24 (×9): qty 1

## 2017-05-24 MED ORDER — SPIRONOLACTONE 25 MG PO TABS
12.5000 mg | ORAL_TABLET | Freq: Every day | ORAL | Status: DC
  Administered 2017-05-24: 12.5 mg via ORAL
  Filled 2017-05-24: qty 1

## 2017-05-24 MED ORDER — POTASSIUM CHLORIDE CRYS ER 20 MEQ PO TBCR
40.0000 meq | EXTENDED_RELEASE_TABLET | Freq: Two times a day (BID) | ORAL | Status: DC
  Administered 2017-05-24 – 2017-05-26 (×5): 40 meq via ORAL
  Filled 2017-05-24 (×5): qty 2

## 2017-05-24 MED ORDER — WARFARIN - PHYSICIAN DOSING INPATIENT
Freq: Every day | Status: DC
  Administered 2017-05-24 – 2017-05-25 (×2)

## 2017-05-24 MED ORDER — AMIODARONE HCL IN DEXTROSE 360-4.14 MG/200ML-% IV SOLN
30.0000 mg/h | INTRAVENOUS | Status: DC
  Administered 2017-05-25 – 2017-05-26 (×2): 30 mg/h via INTRAVENOUS
  Filled 2017-05-24 (×4): qty 200

## 2017-05-24 MED ORDER — ATORVASTATIN CALCIUM 20 MG PO TABS
20.0000 mg | ORAL_TABLET | Freq: Every day | ORAL | Status: DC
  Administered 2017-05-24 – 2017-05-27 (×4): 20 mg via ORAL
  Filled 2017-05-24 (×4): qty 1

## 2017-05-24 MED ORDER — WARFARIN SODIUM 2.5 MG PO TABS: 2.5000 mg | ORAL_TABLET | ORAL | Status: DC

## 2017-05-24 MED ORDER — MAGNESIUM SULFATE 2 GM/50ML IV SOLN
2.0000 g | Freq: Once | INTRAVENOUS | Status: AC
  Administered 2017-05-24: 2 g via INTRAVENOUS
  Filled 2017-05-24: qty 50

## 2017-05-24 MED ORDER — AMIODARONE HCL IN DEXTROSE 360-4.14 MG/200ML-% IV SOLN
60.0000 mg/h | INTRAVENOUS | Status: AC
  Filled 2017-05-24: qty 200

## 2017-05-24 MED ORDER — POTASSIUM CHLORIDE 20 MEQ/15ML (10%) PO SOLN
40.0000 meq | Freq: Two times a day (BID) | ORAL | Status: DC
  Administered 2017-05-24: 40 meq via ORAL
  Filled 2017-05-24: qty 30

## 2017-05-24 MED ORDER — WARFARIN SODIUM 2.5 MG PO TABS: 3.7500 mg | ORAL_TABLET | ORAL | Status: DC

## 2017-05-24 MED ORDER — FUROSEMIDE 10 MG/ML IJ SOLN
40.0000 mg | Freq: Once | INTRAMUSCULAR | Status: AC
  Administered 2017-05-24: 40 mg via INTRAVENOUS

## 2017-05-24 NOTE — Progress Notes (Signed)
CARDIAC REHAB PHASE I   PRE:  Rate/Rhythm: 99 SR  BP:  Sitting: 110/71        SaO2: 100 RA  MODE:  Ambulation: 190 ft   POST:  Rate/Rhythm: 116 SR  BP:  Sitting: 122/69         SaO2: 100 RA  Pt ambulated 190 ft on RA, rolling walker, assist x1, slow, steady gait tolerated fairly well. Pt c/o DOE, fatigue with distance, declined rest stop. Pt to recliner after walk, call bell within reach. Will follow.    [id number] Joylene Grapes, RN, BSN 05/24/2017 10:50 AM

## 2017-05-24 NOTE — Progress Notes (Signed)
  2D Echocardiogram has been performed.  Zelie Asbill T Audreyana Huntsberry 05/24/2017, 3:40 PM

## 2017-05-24 NOTE — Progress Notes (Signed)
[address]       [name] [address]             [phone]         Subjective: Feels a little better, good response to IV lasix, she is not in afib, STach with frequent PVC's  Objective: Vital signs in last 24 hours: Temp:  [96.7 F (35.9 C)-98.2 F (36.8 C)] 96.7 F (35.9 C) (06/21 0554) Pulse Rate:  [54-110] 102 (06/21 0554) Cardiac Rhythm: Sinus tachycardia (06/20 1900) Resp:  [18-20] 18 (06/21 0554) BP: (106-126)/(69-85) 106/74 (06/21 0554) SpO2:  [99 %-100 %] 100 % (06/21 0554) Weight:  [179 lb 14.4 oz (81.6 kg)-185 lb (83.9 kg)] 179 lb 14.4 oz (81.6 kg) (06/21 0554)  Hemodynamic parameters for last 24 hours:    Intake/Output from previous day: 06/20 0701 - 06/21 0700 In: -  Out: 700 [Urine:700] Intake/Output this shift: No intake/output data recorded.   PE  Alert, NAD Lungs- dim left base>right Cor- RRR with occas extrasystole ABD - benign Ext - + LE edema Incis- well healed  Lab Results:  Recent Labs  05/23/17 1737  WBC 10.3  HGB 12.6  HCT 40.1  PLT 233   BMET:  Recent Labs  05/23/17 1737 05/24/17 0321  NA 141 142  K 3.4* 3.5  CL 104 104  CO2 24 26  GLUCOSE 72 82  BUN 21* 21*  CREATININE 1.25* 1.23*  CALCIUM 9.4 9.0    PT/INR:  Recent Labs  05/23/17 1737  LABPROT 25.2*  INR 2.25   ABG    Component Value Date/Time   PHART 7.366 04/13/2017 0335   HCO3 20.2 04/13/2017 0335   TCO2 21 04/13/2017 1711   ACIDBASEDEF 4.2 (H) 04/13/2017 0335   O2SAT 64.6 04/15/2017 0515   CBG (last 3)   Recent Labs  05/23/17 1824 05/23/17 2200 05/24/17 0631  GLUCAP 65 142* 94    Meds Scheduled Meds: . furosemide  40 mg Intravenous Daily  . insulin aspart  0-15 Units Subcutaneous TID WC  . linagliptin  5 mg Oral Daily  . metFORMIN  1,000 mg Oral BID WC  . sodium chloride flush  3 mL Intravenous Q12H   Continuous Infusions: . sodium chloride     PRN Meds:.sodium chloride, acetaminophen, ondansetron (ZOFRAN)  IV, sodium chloride flush  Xrays Dg Chest 2 View  Result Date: 05/24/2017 CLINICAL DATA:  Shortness of breath and new onset atrial fibrillation. EXAM: CHEST  2 VIEW COMPARISON:  05/23/2017 and prior exams FINDINGS: Cardiomegaly, CABG changes and mitral valve replacement again noted. Bilateral pleural effusions and bibasilar atelectasis/consolidation again noted. There is no evidence of pneumothorax. There has been little interval change since the prior study. IMPRESSION: Unchanged appearance of the chest with bilateral pleural effusions and bibasilar atelectasis/consolidation. Electronically Signed   By: Harmon Pier M.D.   On: 05/24/2017 07:14   Dg Chest 2 View  Result Date: 05/23/2017 CLINICAL DATA:  Shortness of Breath EXAM: CHEST  2 VIEW COMPARISON:  Apr 17, 2017 FINDINGS: There is a new small right pleural effusion with patchy consolidation in the right base. There is persistent airspace consolidation in the left base with left effusion, stable and slightly larger than the effusion on the right. Lungs elsewhere clear. There is cardiomegaly with mild pulmonary venous hypertension. No adenopathy. Patient is status post mitral valve replacement and coronary artery bypass grafting. IMPRESSION: Bilateral pleural effusions, stable on the left and new on the right compared to  1 month prior. Patchy consolidation in both lung bases, slightly increased on the left and new on the right compared to 1 month prior. Stable pulmonary vascular congestion. The cardiac silhouette in particular is stable compared to 1 month prior. No adenopathy evident. Electronically Signed   By: Bretta Bang III M.D.   On: 05/23/2017 14:57    Assessment/Plan  1 Feels a bit better, + DOE 2 CHF - BNP 850 , small effusions and periph edema, have asked AHF to assist with maximizing therapy 3 sugars adeq controlled 4 not in afib , although rhythm strip in office looked like it. freq PVC's - replace Mg++ and K+ 5 Left effusion  might benefit from thoracentesis 6 cont coumadin rx for now 7restart ACE inhib     LOS: 1 day    Rachael Davis 05/24/2017

## 2017-05-24 NOTE — Consult Note (Signed)
Advanced Heart Failure Team Consult Note   Primary Physician: Dr Wynelle Link Primary Cardiologist:  Dr Duke Salvia [facility]   Reason for Consultation: Heart Failure   HPI:    Rachael Davis is seen today for evaluation of heart failure at the request of Dr Maren Beach.   Rachael Davis is a 61 year old with a history of DM, HTN, CAD, with recent STEMI Left CX in May of this year. At that time she underwent cath with CTO to RCA and later underwent CABG x4 and tissue MVR. Post op she required aggressive diuretic regimen and also had AF that spontaneously resolved with metoprolol. She was discharged on May 19th, 2018 on lasix 40 mg daily. Discharge weight was  211 pounds.   Yesterday she presented for post hospital follow up with Dr Maren Beach. On exam she had increased lower extremity edema and  A fib. Also reported increased dyspnea on exertion and orthopnea. Weight at home has been falling from discharge 211> 183 pounds. Poor appetite. No bleeding problems.Limited mobility at home due to dyspnea.    Admitted with increased SOB and new onset A fib. EKG on admit with Sinus Tach versus A tach. She has received 40 mg IV lasix x 1. Pertinent admission labs included sodium 141, K 3.4, creatinine 1.25, hgb 12.6, WBC 10.3, BNP 850. CXR bilateral pleural effusions and vascular congestion. Diuresed 4 pounds.   Today she is complaining of a cough. +orthopnea.   ECHO 05/11/2017 - Left ventricle: The cavity size was moderately dilated. Wall   thickness was increased in a pattern of mild LVH. Systolic   function was moderately reduced. The estimated ejection fraction   was in the range of 35% to 40%. Diffuse hypokinesis. The study is   not technically sufficient to allow evaluation of LV diastolic   function. - Mitral valve: Normal appearing bioprosthetic MV no perivalvular   regurgitation. - Left atrium: The atrium was mildly dilated. - Atrial septum: No defect or patent foramen ovale was identified. - Pulmonary  arteries: PA peak pressure: 35 mm Hg (S).   LHC 04/04/2017   Prox LAD lesion, 75 %stenosed.  Prox LAD to Mid LAD lesion, 65 %stenosed.  Mid LAD to Dist LAD lesion, 55 %stenosed.  Ramus lesion, 90 %stenosed.  Lat Ramus lesion, 50 %stenosed.  Prox Cx lesion, 100 %stenosed.  Prox RCA to Mid RCA lesion, 95 %stenosed.  There is moderate left ventricular systolic dysfunction.  LV end diastolic pressure is mildly elevated.  The left ventricular ejection fraction is 35-45% by visual estimate.  There is mild (2+) mitral regurgitation.  1. Severe 3 vessel obstructive CAD 2. Moderate LV dysfunction 3. Mildly elevated LV EDP Review of Systems: [y] = yes, [ ]  = no   General: Weight gain [ ] ; Weight loss [Y]; Anorexia [ ] ; Fatigue [ Y]; Fever [ ] ; Chills [ ] ; Weakness [Y ]  Cardiac: Chest pain/pressure [ ] ; Resting SOB [ ] ; Exertional SOB [Y ]; Orthopnea Gilian.Kraft ]; Pedal Edema [Y ]; Palpitations [ ] ; Syncope [ ] ; Presyncope [ ] ; Paroxysmal nocturnal dyspnea[ ]   Pulmonary: Cough Gilian.Kraft ]; Wheezing[ ] ; Hemoptysis[ ] ; Sputum [ ] ; Snoring [ ]   GI: Vomiting[ ] ; Dysphagia[ ] ; Melena[ ] ; Hematochezia [ ] ; Heartburn[ ] ; Abdominal pain [ ] ; Constipation [ ] ; Diarrhea [ ] ; BRBPR [ ]   GU: Hematuria[ ] ; Dysuria [ ] ; Nocturia[ ]   Vascular: Pain in legs with walking [ ] ; Pain in feet with lying flat [ ] ; Non-healing sores [ ] ;  Stroke [ ] ; TIA [ ] ; Slurred speech [ ] ;  Neuro: Headaches[ ] ; Vertigo[ ] ; Seizures[ ] ; Paresthesias[ ] ;Blurred vision [ ] ; Diplopia [ ] ; Vision changes [ ]   Ortho/Skin: Arthritis [ ] ; Joint pain [ ] ; Muscle pain [ ] ; Joint swelling [ ] ; Back Pain [Y ]; Rash [ ]   Psych: Depression[ ] ; Anxiety[ ]   Heme: Bleeding problems [ ] ; Clotting disorders [ ] ; Anemia [ ]   Endocrine: Diabetes [Y ]; Thyroid dysfunction[ ]   Home Medications Prior to Admission medications   Medication Sig Start Date End Date Taking? Authorizing Provider  aspirin EC 325 MG EC tablet Take 1 tablet (325 mg total) by  mouth daily. 04/21/17  Yes Barrett, Erin R, PA-C  atorvastatin (LIPITOR) 20 MG tablet Take 1 tablet (20 mg total) by mouth daily at 6 PM. 04/21/17  Yes Barrett, Erin R, PA-C  dapagliflozin propanediol (FARXIGA) 10 MG TABS tablet Take 10 mg by mouth daily.   Yes [provider]  furosemide (LASIX) 40 MG tablet Take 40 mg by mouth daily. 05/17/17  Yes [provider]  glipiZIDE (GLUCOTROL XL) 10 MG 24 hr tablet Take 10 mg by mouth 2 (two) times daily.   Yes [provider]  lisinopril (PRINIVIL,ZESTRIL) 10 MG tablet Take 10 mg by mouth daily. 05/17/17  Yes [provider]  metFORMIN (GLUCOPHAGE) 1000 MG tablet Take 1,000 mg by mouth 2 (two) times daily with a meal.   Yes [provider]  metoprolol tartrate (LOPRESSOR) 25 MG tablet Take 25 mg by mouth 2 (two) times daily. 05/08/17  Yes [provider]  potassium chloride SA (K-DUR,KLOR-CON) 20 MEQ tablet Take 1 tablet (20 mEq total) by mouth 2 (two) times daily. 04/21/17  Yes Barrett, Erin R, PA-C  saxagliptin HCl (ONGLYZA) 5 MG TABS tablet Take 5 mg by mouth daily.   Yes [provider]  warfarin (COUMADIN) 2.5 MG tablet Take 1 tablet (2.5 mg total) by mouth daily. Patient taking differently: Take 2.5-3.75 mg by mouth See admin instructions. Mon and Fri 3.75mg  and 2.5mg  on all other days of the week 05/02/17  Yes Chilton Si, MD  traMADol (ULTRAM) 50 MG tablet Take 1-2 tablets (50-100 mg total) by mouth every 4 (four) hours as needed for moderate pain. Patient not taking: Reported on 05/23/2017 04/21/17   Barrett, Rae Roam, PA-C    Past Medical History: Past Medical History:  Diagnosis Date  . CHF (congestive heart failure) (HCC)   . Diabetes (HCC)   . Hypertension   . New onset atrial fibrillation (HCC) 05/2017    Past Surgical History: Past Surgical History:  Procedure Laterality Date  . CORONARY ARTERY BYPASS GRAFT N/A 04/12/2017   Procedure: CORONARY ARTERY BYPASS GRAFTING  (CABG), ON PUMP, TIMES FOUR, USING LEFT INTERNAL MAMMARY ARTERY AND ENDOSCOPICALLY HARVESTED RIGHT GREATER SAPHENOUS VEIN;  Surgeon: Kerin Perna, MD;  Location: [facility] OR;  Service: Open Heart Surgery;  Laterality: N/A;  LIMA to LAD; SVG to OM; SVG to Ramus; SVG to Right  . LEFT HEART CATH AND CORONARY ANGIOGRAPHY N/A 04/04/2017   Procedure: Left Heart Cath and Coronary Angiography;  Surgeon: Peter M [name], MD;  Location: [facility] INVASIVE CV LAB;  Service: Cardiovascular;  Laterality: N/A;  . MITRAL VALVE REPAIR N/A 04/12/2017   Procedure: MITRAL VALVE REPLACEMENT (MVR);  Surgeon: Donata Clay, Theron Arista, MD;  Location: [facility] OR;  Service: Open Heart Surgery;  Laterality: N/A;  Using a 25mm Magna Ease Mitral Pericardial Bioprosthesis  . TEE WITHOUT CARDIOVERSION N/A  04/12/2017   Procedure: TRANSESOPHAGEAL ECHOCARDIOGRAM (TEE);  Surgeon: Donata Clay, Theron Arista, MD;  Location: [facility] OR;  Service: Open Heart Surgery;  Laterality: N/A;  . TUBAL LIGATION      Family History: Family History  Problem Relation Age of Onset  . Heart attack Father 86    Social History: Social History   Social History  . Marital status: Married    Spouse name: N/A  . Number of children: N/A  . Years of education: N/A   Social History Main Topics  . Smoking status: Never Smoker  . Smokeless tobacco: Never Used  . Alcohol use No  . Drug use: No  . Sexual activity: Not Asked   Other Topics Concern  . None   Social History Narrative  . None    Allergies:  No Known Allergies  Objective:    Vital Signs:   Temp:  [96.7 F (35.9 C)-98.2 F (36.8 C)] 96.7 F (35.9 C) (06/21 0554) Pulse Rate:  [54-108] 102 (06/21 0554) Resp:  [18] 18 (06/21 0554) BP: (106-125)/(69-74) 106/74 (06/21 0554) SpO2:  [99 %-100 %] 100 % (06/21 0554) Weight:  [179 lb 14.4 oz (81.6 kg)-183 lb 1.6 oz (83.1 kg)] 179 lb 14.4 oz (81.6 kg) (06/21 0554) Last BM Date: 05/23/17  Weight change: Filed Weights   05/23/17 1709 05/24/17 0554  Weight: 183 lb  1.6 oz (83.1 kg) 179 lb 14.4 oz (81.6 kg)    Intake/Output:   Intake/Output Summary (Last 24 hours) at 05/24/17 1013 Last data filed at 05/24/17 0300  Gross per 24 hour  Intake                0 ml  Output              700 ml  Net             -700 ml     Physical Exam: General:  Coughing. Sitting in the chair. No resp difficulty HEENT: normal Neck: supple. JVP to jaw  . Carotids 2+ bilat; no bruits. No lymphadenopathy or thyromegaly appreciated. Cor: PMI nondisplaced. Tachy. Frequent PVCs  No rubs, gallops or murmurs. Sternal incision approximated.  Lungs: Decreased RML RLL LLL on room air.  Abdomen: soft, nontender, nondistended. No hepatosplenomegaly. No bruits or masses. Good bowel sounds. Extremities: no cyanosis, clubbing, rash, R and LLE 1+ edema. Extremities warm Neuro: alert & orientedx3, cranial nerves grossly intact. moves all 4 extremities w/o difficulty. Affect pleasant   Telemetry: Sinus Tach with frequent PVCs 100s Personally reviewed (PVC burden 15-20%)  Labs: Basic Metabolic Panel:  Recent Labs Lab 05/23/17 1737 05/24/17 0321  NA 141 142  K 3.4* 3.5  CL 104 104  CO2 24 26  GLUCOSE 72 82  BUN 21* 21*  CREATININE 1.25* 1.23*  CALCIUM 9.4 9.0  MG 1.7  --     Liver Function Tests:  Recent Labs Lab 05/23/17 1737  AST 19  ALT 23  ALKPHOS 111  BILITOT 0.6  PROT 7.9  ALBUMIN 3.7   No results for input(s): LIPASE, AMYLASE in the last 168 hours. No results for input(s): AMMONIA in the last 168 hours.  CBC:  Recent Labs Lab 05/23/17 1737  WBC 10.3  NEUTROABS 7.3  HGB 12.6  HCT 40.1  MCV 74.8*  PLT 233    Cardiac Enzymes: No results for input(s): CKTOTAL, CKMB, CKMBINDEX, TROPONINI in the last 168 hours.  BNP: BNP (last 3 results)  Recent Labs  05/23/17 1737  BNP 850.4*  ProBNP (last 3 results) No results for input(s): PROBNP in the last 8760 hours.   CBG:  Recent Labs Lab 05/23/17 1824 05/23/17 2200 05/24/17 0631    GLUCAP 65 142* 94    Coagulation Studies:  Recent Labs  05/23/17 1737  LABPROT 25.2*  INR 2.25    Other results: PPJ:KDTOI Tach versus A tach 110 bpm   Imaging: Dg Chest 2 View  Result Date: 05/24/2017 CLINICAL DATA:  Shortness of breath and new onset atrial fibrillation. EXAM: CHEST  2 VIEW COMPARISON:  05/23/2017 and prior exams FINDINGS: Cardiomegaly, CABG changes and mitral valve replacement again noted. Bilateral pleural effusions and bibasilar atelectasis/consolidation again noted. There is no evidence of pneumothorax. There has been little interval change since the prior study. IMPRESSION: Unchanged appearance of the chest with bilateral pleural effusions and bibasilar atelectasis/consolidation. Electronically Signed   By: Harmon Pier M.D.   On: 05/24/2017 07:14   Dg Chest 2 View  Result Date: 05/23/2017 CLINICAL DATA:  Shortness of Breath EXAM: CHEST  2 VIEW COMPARISON:  Apr 17, 2017 FINDINGS: There is a new small right pleural effusion with patchy consolidation in the right base. There is persistent airspace consolidation in the left base with left effusion, stable and slightly larger than the effusion on the right. Lungs elsewhere clear. There is cardiomegaly with mild pulmonary venous hypertension. No adenopathy. Patient is status post mitral valve replacement and coronary artery bypass grafting. IMPRESSION: Bilateral pleural effusions, stable on the left and new on the right compared to 1 month prior. Patchy consolidation in both lung bases, slightly increased on the left and new on the right compared to 1 month prior. Stable pulmonary vascular congestion. The cardiac silhouette in particular is stable compared to 1 month prior. No adenopathy evident. Electronically Signed   By: Bretta Bang III M.D.   On: 05/23/2017 14:57      Medications:     Current Medications: . furosemide  40 mg Intravenous BID  . insulin aspart  0-15 Units Subcutaneous TID WC  . linagliptin   5 mg Oral Daily  . lisinopril  10 mg Oral Daily  . magnesium oxide  400 mg Oral BID  . metFORMIN  1,000 mg Oral BID WC  . metoprolol tartrate  25 mg Oral BID  . potassium chloride  40 mEq Oral BID  . sodium chloride flush  3 mL Intravenous Q12H  . warfarin  2.5 mg Oral q1800  . Warfarin - Physician Dosing Inpatient   Does not apply q1800     Infusions: . sodium chloride        Assessment/Plan  Rachael Davis is a 61 year old with recent CABG x4 and MVR on May 10th admitted with volume overload and Afib. EKG on admit showed Sinus Tach and not A fib. Tachycardia may reflect low output. Possible PICC placement if she does not improve. Repeat ECHO   1. A/C Systolic Heart Failure- ICM ECHO 05/11/2017 EF 35-40% Stop lopressor with concern for low output. For now try to diurese. May need PICC but will hold off for now. Later would start carvedilol.  Volume status elevated. Continue lasix 40 mg twice a day.  Stop lisinopril due to cough.  She also has known history cough associated with lisinopril. Tomorrow add 12.5 mg losartan if pressure ok Add 12.5 mg spiro daily Repeat ECHO. If EF lower will add digoxin.  Renal function stable.  Add ted hose 2. CAD S/P CABG x4 - on coumadin. Restart Lipitor 20  mg daily. No bb with concern for low output. 3. MVR , tissue. On coumadin. INR goal 2.0-3.0. Dscussed with pharm d.  4.PVCs- K 3.5 Mag 1.7 Give 40 meq potassium twice a day. Started on mag ox. Give 2 grams mag now. Check magnesium in am. Will need to suppress PVCs. Hopefully can add bb back tomorrow.  5. DM- on metformin. Continue sliding scale.   6. Bilateral Pleural Effusions- noted on CXR L>R 7. PAF - recent hospitalization. Spontaneously converted. Repeat EKG now.    Length of Stay: 1  Amy Clegg, NP  05/24/2017, 10:13 AM  Advanced Heart Failure Team Pager [phone] (M-F; 7a - 4p)  Please contact [facility] for night-coverage after hours (4p -7a ) and weekends on [email or web address]  Patient  seen and examined with Tonye Becket, NP. We discussed all aspects of the encounter. I agree with the assessment and plan as stated above.   61 y/o woman with recent LCX MI and ischemic MR who underwent CABG and MVR (bioprosthetic) in 5/18. Now admitted with recurrent HF.   CXR shows bilateral effusions L>R. Echo EF 35-40% with stable MVR. Tele with sinus tach and frequent PVCS (15-20%)  - all viewed personally  Agree with IV diuresis. Will start amio to suppress PVCs. Supp K and mag.   May need thoracentesis if effusions don't resolve with IV diuresis.   Arvilla Meres, MD  4:26 PM

## 2017-05-24 NOTE — Progress Notes (Addendum)
   Called by nursing staff. Increased dyspnea with exertion. Sats > 90%. JVP up.   Give another 40 mg IV lasix now.   Amy Clegg NP-C  1:27 PM  Agree.  Arvilla Meres, MD  4:10 PM

## 2017-05-24 NOTE — Care Management Note (Signed)
Case Management Note Donn Pierini RN, BSN Unit 2W-Case Manager [phone]  Patient Details  Name: Rachael Davis MRN: [id number] Date of Birth: [birth year]  Subjective/Objective:  Pt admitted with volume overload                  Action/Plan: PTA pt lived at home with spouse- active with [facility] for HHRN/PT- will need resumption orders to resume [facility] services on discharge. Pt has rollator at home. CM to follow  Expected Discharge Date:                  Expected Discharge Plan:  Home w Home Health Services  In-House Referral:     Discharge planning Services  CM Consult  Post Acute Care Choice:  Home Health, Resumption of Svcs/PTA Provider Choice offered to:  Patient  DME Arranged:    DME Agency:     HH Arranged:  RN, PT HH Agency:  [facility]  Status of Service:  In process, will continue to follow  If discussed at Long Length of Stay Meetings, dates discussed:    Discharge Disposition: home with home health   Additional Comments:  Darrold Span, RN 05/24/2017, 12:30 PM

## 2017-05-25 ENCOUNTER — Inpatient Hospital Stay (HOSPITAL_COMMUNITY): Payer: Managed Care, Other (non HMO)

## 2017-05-25 ENCOUNTER — Encounter (HOSPITAL_COMMUNITY): Payer: Self-pay | Admitting: Student

## 2017-05-25 HISTORY — PX: IR THORACENTESIS ASP PLEURAL SPACE W/IMG GUIDE: IMG5380

## 2017-05-25 LAB — BASIC METABOLIC PANEL
Anion gap: 12 (ref 5–15)
BUN: 19 mg/dL (ref 6–20)
CO2: 26 mmol/L (ref 22–32)
Calcium: 9 mg/dL (ref 8.9–10.3)
Chloride: 102 mmol/L (ref 101–111)
Creatinine, Ser: 1.22 mg/dL — ABNORMAL HIGH (ref 0.44–1.00)
GFR calc Af Amer: 54 mL/min — ABNORMAL LOW (ref >60)
GFR calc non Af Amer: 46 mL/min — ABNORMAL LOW (ref >60)
Glucose, Bld: 164 mg/dL — ABNORMAL HIGH (ref 65–99)
Potassium: 3.8 mmol/L (ref 3.5–5.1)
Sodium: 140 mmol/L (ref 135–145)

## 2017-05-25 LAB — GLUCOSE, CAPILLARY
Glucose-Capillary: 150 mg/dL — ABNORMAL HIGH (ref 65–99)
Glucose-Capillary: 175 mg/dL — ABNORMAL HIGH (ref 65–99)
Glucose-Capillary: 173 mg/dL — ABNORMAL HIGH (ref 65–99)
Glucose-Capillary: 181 mg/dL — ABNORMAL HIGH (ref 65–99)

## 2017-05-25 LAB — PROTIME-INR
INR: 2.49
Prothrombin Time: 27.4 seconds — ABNORMAL HIGH (ref 11.4–15.2)

## 2017-05-25 LAB — MAGNESIUM: Magnesium: 2.4 mg/dL (ref 1.7–2.4)

## 2017-05-25 MED ORDER — LIDOCAINE HCL (PF) 1 % IJ SOLN
INTRAMUSCULAR | Status: AC
Start: 2017-05-25 — End: 2017-05-25
  Administered 2017-05-25: 14:00:00
  Filled 2017-05-25: qty 30

## 2017-05-25 MED ORDER — CARVEDILOL 3.125 MG PO TABS
3.1250 mg | ORAL_TABLET | Freq: Two times a day (BID) | ORAL | Status: DC
  Administered 2017-05-25 – 2017-05-28 (×7): 3.125 mg via ORAL
  Filled 2017-05-25 (×7): qty 1

## 2017-05-25 MED ORDER — LIDOCAINE HCL 1 % IJ SOLN
INTRAMUSCULAR | Status: DC | PRN
  Administered 2017-05-25: 10 mL

## 2017-05-25 MED ORDER — SPIRONOLACTONE 25 MG PO TABS
25.0000 mg | ORAL_TABLET | Freq: Every day | ORAL | Status: DC
  Administered 2017-05-25 – 2017-05-28 (×4): 25 mg via ORAL
  Filled 2017-05-25 (×4): qty 1

## 2017-05-25 NOTE — Progress Notes (Signed)
Advanced Heart Failure Rounding Note   Subjective:   Admitted with volume overload, bilateral pleural effusions, and frequent PVCs.  Started on amio drip to suppress PVCs and diuresed with IV lasix. Negative 2 liters.   Feeling better. Mild dyspnea with exertion. Able to 300 feet on room air. O2 sats 97% on room air.   ECHO 05/24/2017 EF 35-40%. Bioprothestic MV Trivial regurgitation.    Objective:   Weight Range:  Vital Signs:   Temp:  [97.6 F (36.4 C)-98.3 F (36.8 C)] 97.8 F (36.6 C) (06/22 0447) Pulse Rate:  [88-109] 88 (06/22 0447) Resp:  [20-21] 20 (06/22 0447) BP: (106-108)/(64-83) 106/73 (06/22 0447) SpO2:  [100 %] 100 % (06/22 0447) Weight:  [178 lb 9.6 oz (81 kg)] 178 lb 9.6 oz (81 kg) (06/22 0602) Last BM Date: 05/24/17  Weight change: Filed Weights   05/23/17 1709 05/24/17 0554 05/25/17 0602  Weight: 183 lb 1.6 oz (83.1 kg) 179 lb 14.4 oz (81.6 kg) 178 lb 9.6 oz (81 kg)    Intake/Output:   Intake/Output Summary (Last 24 hours) at 05/25/17 0825 Last data filed at 05/25/17 0604  Gross per 24 hour  Intake           806.39 ml  Output             3100 ml  Net         -2293.61 ml     Physical Exam: General:  Well appearing. No resp difficulty. Sitting in the chair HEENT: normal Neck: supple. JVP 9-10. Carotids 2+ bilat; no bruits. No lymphadenopathy or thryomegaly appreciated. Cor: PMI nondisplaced. Regular rate & rhythm. No rubs, gallops or murmurs. Lungs: Decreased LLL on room air.  Abdomen: soft, nontender, nondistended. No hepatosplenomegaly. No bruits or masses. Good bowel sounds. Extremities: no cyanosis, clubbing, rash, R and LLE 1+ edema R and LLE SCDs  Neuro: alert & orientedx3, cranial nerves grossly intact. moves all 4 extremities w/o difficulty. Affect pleasant  Telemetry: Sinus Tach 100s with PVCs   Labs: Basic Metabolic Panel:  Recent Labs Lab 05/23/17 1737 05/24/17 0321 05/25/17 0209  NA 141 142 140  K 3.4* 3.5 3.8  CL 104  104 102  CO2 24 26 26   GLUCOSE 72 82 164*  BUN 21* 21* 19  CREATININE 1.25* 1.23* 1.22*  CALCIUM 9.4 9.0 9.0  MG 1.7  --  2.4    Liver Function Tests:  Recent Labs Lab 05/23/17 1737  AST 19  ALT 23  ALKPHOS 111  BILITOT 0.6  PROT 7.9  ALBUMIN 3.7   No results for input(s): LIPASE, AMYLASE in the last 168 hours. No results for input(s): AMMONIA in the last 168 hours.  CBC:  Recent Labs Lab 05/23/17 1737  WBC 10.3  NEUTROABS 7.3  HGB 12.6  HCT 40.1  MCV 74.8*  PLT 233    Cardiac Enzymes: No results for input(s): CKTOTAL, CKMB, CKMBINDEX, TROPONINI in the last 168 hours.  BNP: BNP (last 3 results)  Recent Labs  05/23/17 1737  BNP 850.4*    ProBNP (last 3 results) No results for input(s): PROBNP in the last 8760 hours.    Other results:  Imaging: Dg Chest 2 View  Result Date: 05/24/2017 CLINICAL DATA:  Shortness of breath and new onset atrial fibrillation. EXAM: CHEST  2 VIEW COMPARISON:  05/23/2017 and prior exams FINDINGS: Cardiomegaly, CABG changes and mitral valve replacement again noted. Bilateral pleural effusions and bibasilar atelectasis/consolidation again noted. There is no evidence of pneumothorax.  There has been little interval change since the prior study. IMPRESSION: Unchanged appearance of the chest with bilateral pleural effusions and bibasilar atelectasis/consolidation. Electronically Signed   By: Harmon Pier M.D.   On: 05/24/2017 07:14   Dg Chest 2 View  Result Date: 05/23/2017 CLINICAL DATA:  Shortness of Breath EXAM: CHEST  2 VIEW COMPARISON:  Apr 17, 2017 FINDINGS: There is a new small right pleural effusion with patchy consolidation in the right base. There is persistent airspace consolidation in the left base with left effusion, stable and slightly larger than the effusion on the right. Lungs elsewhere clear. There is cardiomegaly with mild pulmonary venous hypertension. No adenopathy. Patient is status post mitral valve replacement  and coronary artery bypass grafting. IMPRESSION: Bilateral pleural effusions, stable on the left and new on the right compared to 1 month prior. Patchy consolidation in both lung bases, slightly increased on the left and new on the right compared to 1 month prior. Stable pulmonary vascular congestion. The cardiac silhouette in particular is stable compared to 1 month prior. No adenopathy evident. Electronically Signed   By: Bretta Bang III M.D.   On: 05/23/2017 14:57      Medications:     Scheduled Medications: . atorvastatin  20 mg Oral q1800  . furosemide  40 mg Intravenous BID  . insulin aspart  0-15 Units Subcutaneous TID WC  . linagliptin  5 mg Oral Daily  . magnesium oxide  400 mg Oral BID  . metFORMIN  1,000 mg Oral BID WC  . potassium chloride  40 mEq Oral BID  . sodium chloride flush  3 mL Intravenous Q12H  . spironolactone  12.5 mg Oral Daily  . warfarin  2.5 mg Oral q1800  . Warfarin - Physician Dosing Inpatient   Does not apply q1800     Infusions: . sodium chloride    . amiodarone 30 mg/hr (05/24/17 2035)     PRN Medications:  sodium chloride, acetaminophen, ondansetron (ZOFRAN) IV, sodium chloride flush   Assessment/Plan/Discussion:   Mrs Beissel is a 61 year old with recent CABG x4 and MVR on May 10th admitted with volume overload and Afib. EKG on admit showed Sinus Tach with PVC and not A fib.   1. A/C Systolic Heart Failure- ICM ECHO 05/11/2017 EF 35-40%. Yesterday had repeat ECHO. Stable. No change in ECHO from 05/24/2017  Good response with IV lasix. Volume status improving. Continue lasix 40 mg IV twice a day.  Keep off lopressor with reduced EF. Add low dose coreg 3.125 mg twice a day.    Keep off lisinopril due to cough.  She also has known history cough associated with lisinopril. Hold off on losartan.  Increase spiro to 25 mg daily.  Renal function stable.  Continue ted hose.  2. CAD S/P CABG x4 - on coumadin. Continue Lipitor 20 mg daily. Add  low dose bb.  3. MVR , tissue. On coumadin. INR goal 2.0-3.0. INR 2.49. Dscussed with pharm d.  4.PVCs- Started on amio drip with frequent PVCs noted . Continue to load amio. Keep K >4 Mag >2. K 3.5 Mag 1.7 Continue 40 meq potassium twice a day. Increase spiro to 25 mg daily. Continue mag ox.   5. DM- on metformin. Continue sliding scale.   6. Bilateral Pleural Effusions- noted on CXR L>R. Repeat CXR per TCTS. Still may need thoracentesis.  7. PAF - recent hospitalization. Spontaneously converted. .   Length of Stay: 2  Amy Clegg NP-C  05/25/2017,  8:25 AM  Advanced Heart Failure Team Pager [phone] (M-F; 7a - 4p)  Please contact [facility] for night-coverage after hours (4p -7a ) and weekends on [email or web address]  Patient seen with NP, agree with the above note.  She diuresed well yesterday but still has volume overload on exam.   - Continue current IV Lasix.  - Will have left thoracentesis today.  - Increase spironolactone to 25 mg daily.  - Transition beta blocker to Coreg.  - Start losartan tomorrow.   Marca Ancona 05/25/2017 12:56 PM

## 2017-05-25 NOTE — Progress Notes (Addendum)
[address]       [place] [address]             [phone]         Subjective: Overall feels better, subjectively less SOB  Objective: Vital signs in last 24 hours: Temp:  [97.6 F (36.4 C)-98.3 F (36.8 C)] 97.8 F (36.6 C) (06/22 0447) Pulse Rate:  [88-109] 88 (06/22 0447) Cardiac Rhythm: Normal sinus rhythm (06/22 0700) Resp:  [20-21] 20 (06/22 0447) BP: (106-108)/(64-83) 106/73 (06/22 0447) SpO2:  [100 %] 100 % (06/22 0447) Weight:  [178 lb 9.6 oz (81 kg)] 178 lb 9.6 oz (81 kg) (06/22 0602)  Hemodynamic parameters for last 24 hours:    Intake/Output from previous day: 06/21 0701 - 06/22 0700 In: 1046.4 [P.O.:720; I.V.:326.4] Out: 3100 [Urine:3100] Intake/Output this shift: No intake/output data recorded.  General appearance: alert, cooperative and no distress Heart: regular rate and rhythm and + extrasystoles Lungs: dim in bases Abdomen: benign Extremities: + LE edema, feet in particular Wound: no evidence of incis infection  Lab Results:  Recent Labs  05/23/17 1737  WBC 10.3  HGB 12.6  HCT 40.1  PLT 233   BMET:  Recent Labs  05/24/17 0321 05/25/17 0209  NA 142 140  K 3.5 3.8  CL 104 102  CO2 26 26  GLUCOSE 82 164*  BUN 21* 19  CREATININE 1.23* 1.22*  CALCIUM 9.0 9.0    PT/INR:  Recent Labs  05/25/17 0209  LABPROT 27.4*  INR 2.49   ABG    Component Value Date/Time   PHART 7.366 04/13/2017 0335   HCO3 20.2 04/13/2017 0335   TCO2 21 04/13/2017 1711   ACIDBASEDEF 4.2 (H) 04/13/2017 0335   O2SAT 64.6 04/15/2017 0515   CBG (last 3)   Recent Labs  05/24/17 1620 05/24/17 2108 05/25/17 0638  GLUCAP 119* 230* 150*    Meds Scheduled Meds: . atorvastatin  20 mg Oral q1800  . furosemide  40 mg Intravenous BID  . insulin aspart  0-15 Units Subcutaneous TID WC  . linagliptin  5 mg Oral Daily  . magnesium oxide  400 mg Oral BID  . metFORMIN  1,000 mg Oral BID WC  . potassium chloride  40 mEq Oral BID    . sodium chloride flush  3 mL Intravenous Q12H  . spironolactone  12.5 mg Oral Daily  . warfarin  2.5 mg Oral q1800  . Warfarin - Physician Dosing Inpatient   Does not apply q1800   Continuous Infusions: . sodium chloride    . amiodarone 30 mg/hr (05/24/17 2035)   PRN Meds:.sodium chloride, acetaminophen, ondansetron (ZOFRAN) IV, sodium chloride flush  Xrays Dg Chest 2 View  Result Date: 05/24/2017 CLINICAL DATA:  Shortness of breath and new onset atrial fibrillation. EXAM: CHEST  2 VIEW COMPARISON:  05/23/2017 and prior exams FINDINGS: Cardiomegaly, CABG changes and mitral valve replacement again noted. Bilateral pleural effusions and bibasilar atelectasis/consolidation again noted. There is no evidence of pneumothorax. There has been little interval change since the prior study. IMPRESSION: Unchanged appearance of the chest with bilateral pleural effusions and bibasilar atelectasis/consolidation. Electronically Signed   By: Harmon Pier M.D.   On: 05/24/2017 07:14   Dg Chest 2 View  Result Date: 05/23/2017 CLINICAL DATA:  Shortness of Breath EXAM: CHEST  2 VIEW COMPARISON:  Apr 17, 2017 FINDINGS: There is a new small right pleural effusion with patchy consolidation in the right base. There is persistent airspace  consolidation in the left base with left effusion, stable and slightly larger than the effusion on the right. Lungs elsewhere clear. There is cardiomegaly with mild pulmonary venous hypertension. No adenopathy. Patient is status post mitral valve replacement and coronary artery bypass grafting. IMPRESSION: Bilateral pleural effusions, stable on the left and new on the right compared to 1 month prior. Patchy consolidation in both lung bases, slightly increased on the left and new on the right compared to 1 month prior. Stable pulmonary vascular congestion. The cardiac silhouette in particular is stable compared to 1 month prior. No adenopathy evident. Electronically Signed   By: Bretta Bang III M.D.   On: 05/23/2017 14:57    Assessment/Plan:   1 conts to imptove overall. AHF team managing CHF, now on amio 2 EF 35-40 by echo, trivial MR 3 conts coumadin 4 sugars variable- cont current rx for now 5 creat improving with diuresis 6 may require thoracentesis  LOS: 2 days    GOLD,[name] 05/25/2017   Patient examined and echocardiogram personally reviewed - her preop EF was 35-40% Clinically improved in nsr L thoracentesis requested- cont coumadin at 2.5 for now Appreciate recommendations by Adv HF Team patient examined and medical record reviewed,agree with above note. [name] 05/25/2017

## 2017-05-25 NOTE — Progress Notes (Signed)
CARDIAC REHAB PHASE I   PRE:  Rate/Rhythm: 93 SR  BP:  Supine:   Sitting: 108/71  Standing:    SaO2: 100%RA  MODE:  Ambulation: 310 ft   POST:  Rate/Rhythm: 109 ST  BP:  Supine:   Sitting: 104/78  Standing:    SaO2: 97%RA [id number] Pt walked 310 ft on RA with rolling walker and minimal asst after using BSC. She did not need to stop and rest but did have some SOB. Sats good on RA. To recliner with call bell. Left CHF booklet and low sodium diets and encouraged pt to read.   Luetta Nutting, RN BSN  05/25/2017 8:47 AM

## 2017-05-25 NOTE — Procedures (Signed)
PROCEDURE SUMMARY:  Successful US guided right therapeutic thoracentesis. Yielded 450 mL of blood-tinged fluid. Pt tolerated procedure well. No immediate complications.  Specimen was not sent for labs. CXR ordered.  Hoyt Koch PA-C 05/25/2017 2:29 PM

## 2017-05-26 ENCOUNTER — Inpatient Hospital Stay (HOSPITAL_COMMUNITY): Payer: Managed Care, Other (non HMO)

## 2017-05-26 LAB — BASIC METABOLIC PANEL
Anion gap: 10 (ref 5–15)
BUN: 27 mg/dL — ABNORMAL HIGH (ref 6–20)
CO2: 27 mmol/L (ref 22–32)
Calcium: 9 mg/dL (ref 8.9–10.3)
Chloride: 102 mmol/L (ref 101–111)
Creatinine, Ser: 1.41 mg/dL — ABNORMAL HIGH (ref 0.44–1.00)
GFR calc Af Amer: 45 mL/min — ABNORMAL LOW (ref >60)
GFR calc non Af Amer: 39 mL/min — ABNORMAL LOW (ref >60)
Glucose, Bld: 175 mg/dL — ABNORMAL HIGH (ref 65–99)
Potassium: 4.6 mmol/L (ref 3.5–5.1)
Sodium: 139 mmol/L (ref 135–145)

## 2017-05-26 LAB — GLUCOSE, CAPILLARY
Glucose-Capillary: 144 mg/dL — ABNORMAL HIGH (ref 65–99)
Glucose-Capillary: 180 mg/dL — ABNORMAL HIGH (ref 65–99)
Glucose-Capillary: 161 mg/dL — ABNORMAL HIGH (ref 65–99)
Glucose-Capillary: 165 mg/dL — ABNORMAL HIGH (ref 65–99)

## 2017-05-26 LAB — PROTIME-INR
INR: 2.22
Prothrombin Time: 25 seconds — ABNORMAL HIGH (ref 11.4–15.2)

## 2017-05-26 MED ORDER — AMIODARONE HCL 200 MG PO TABS
200.0000 mg | ORAL_TABLET | Freq: Two times a day (BID) | ORAL | Status: DC
  Administered 2017-05-26 – 2017-05-28 (×5): 200 mg via ORAL
  Filled 2017-05-26 (×5): qty 1

## 2017-05-26 MED ORDER — GUAIFENESIN-DM 100-10 MG/5ML PO SYRP
5.0000 mL | ORAL_SOLUTION | ORAL | Status: DC | PRN
  Administered 2017-05-26: 5 mL via ORAL
  Filled 2017-05-26: qty 5

## 2017-05-26 NOTE — Progress Notes (Addendum)
[address]       [name] [address]             [phone]         Subjective: Feeling "better", + cough, breathing is more comfortable, ambulation improving  Objective: Vital signs in last 24 hours: Temp:  [95.2 F (35.1 C)-99.1 F (37.3 C)] 98 F (36.7 C) (06/23 0600) Pulse Rate:  [84-90] 90 (06/23 0600) Cardiac Rhythm: Normal sinus rhythm (06/22 1908) Resp:  [18-21] 18 (06/23 0600) BP: (103-111)/(68-74) 111/74 (06/23 0600) SpO2:  [98 %-100 %] 100 % (06/23 0600) Weight:  [179 lb 8 oz (81.4 kg)] 179 lb 8 oz (81.4 kg) (06/23 0600)  Hemodynamic parameters for last 24 hours:    Intake/Output from previous day: 06/22 0701 - 06/23 0700 In: 1380 [P.O.:930] Out: 1150 [Urine:1150] Intake/Output this shift: No intake/output data recorded.  General appearance: alert, cooperative and no distress Heart: regular rate and rhythm Lungs: mildly dim in bases Abdomen: benign Extremities: edema about the same Wound: incis healed  Lab Results:  Recent Labs  05/23/17 1737  WBC 10.3  HGB 12.6  HCT 40.1  PLT 233   BMET:   Recent Labs  05/25/17 0209 05/26/17 0146  NA 140 139  K 3.8 4.6  CL 102 102  CO2 26 27  GLUCOSE 164* 175*  BUN 19 27*  CREATININE 1.22* 1.41*  CALCIUM 9.0 9.0    PT/INR:   Recent Labs  05/26/17 0146  LABPROT 25.0*  INR 2.22   ABG    Component Value Date/Time   PHART 7.366 04/13/2017 0335   HCO3 20.2 04/13/2017 0335   TCO2 21 04/13/2017 1711   ACIDBASEDEF 4.2 (H) 04/13/2017 0335   O2SAT 64.6 04/15/2017 0515   CBG (last 3)   Recent Labs  05/25/17 1651 05/25/17 2101 05/26/17 0618  GLUCAP 173* 181* 144*    Meds Scheduled Meds: . atorvastatin  20 mg Oral q1800  . carvedilol  3.125 mg Oral BID WC  . furosemide  40 mg Intravenous BID  . insulin aspart  0-15 Units Subcutaneous TID WC  . linagliptin  5 mg Oral Daily  . magnesium oxide  400 mg Oral BID  . metFORMIN  1,000 mg Oral BID WC  . potassium  chloride  40 mEq Oral BID  . sodium chloride flush  3 mL Intravenous Q12H  . spironolactone  25 mg Oral Daily  . warfarin  2.5 mg Oral q1800  . Warfarin - Physician Dosing Inpatient   Does not apply q1800   Continuous Infusions: . sodium chloride    . amiodarone 30 mg/hr (05/26/17 0138)   PRN Meds:.sodium chloride, acetaminophen, guaiFENesin-dextromethorphan, lidocaine, ondansetron (ZOFRAN) IV, sodium chloride flush  Xrays Dg Chest 2 View  Result Date: 05/25/2017 CLINICAL DATA:  61 year old female status post thoracentesis EXAM: CHEST  2 VIEW COMPARISON:  05/24/2017 FINDINGS: Cardiomediastinal silhouette unchanged in size and contour. Surgical changes of prior median sternotomy, CABG, mitral valve annuloplasty. Compare to the prior, decreased opacity at the left base, though persisting basilar opacity with blunting of left costophrenic angle and the costophrenic sulcus on the lateral view. Thickening of the fissure. No pneumothorax status post thoracentesis. Right lung relatively well aerated, similar to prior. IMPRESSION: Improved left basilar opacity status post thoracentesis with no evidence of pneumothorax. Surgical changes of prior median sternotomy, CABG, mitral valve annuloplasty. Electronically Signed   By: Gilmer Mor D.O.   On: 05/25/2017 14:30   Ir Thoracentesis  Asp Pleural Space W/img Guide  Result Date: 05/25/2017 INDICATION: Patient with history of CHF, and bilateral pleural effusions. Request is made for therapeutic thoracentesis. EXAM: ULTRASOUND GUIDED THERAPEUTIC RIGHT THORACENTESIS MEDICATIONS: 10 mL 1% lidocaine COMPLICATIONS: None immediate. PROCEDURE: An ultrasound guided thoracentesis was thoroughly discussed with the patient and questions answered. The benefits, risks, alternatives and complications were also discussed. The patient understands and wishes to proceed with the procedure. Written consent was obtained. Ultrasound was performed to localize and mark an adequate  pocket of fluid in the right chest. The area was then prepped and draped in the normal sterile fashion. 1% Lidocaine was used for local anesthesia. Under ultrasound guidance a Safe-T-Centesis catheter was introduced. Thoracentesis was performed. The catheter was removed and a dressing applied. FINDINGS: A total of approximately 450 mL of blood-tinged fluid was removed. IMPRESSION: Successful ultrasound guided right therapeutic thoracentesis yielding 450 mL of pleural fluid. Read by:  Loyce Dys PA-C Electronically Signed   By: Malachy Moan M.D.   On: 05/25/2017 14:28    Assessment/Plan:  1 doing better clinically, 450 cc from thoracentesis, CXR still shows mod left effusion vs consolidation 2 conts CHF management per AHF team, does not appear that amiodarone is suppressing PVC's - consider stopping 3 creat/BUN bumped a little, may have to decrease lasix dosing, CO2 still in normal range but slowly rising so will need to watch for alkalosis from diuretics 4 sugars reasonably controlled 5 conts coumadin  LOS: 3 days    GOLD,[name] 05/26/2017  doing better after diuresis- one more day iv dosing CXR imroves after R thoracentesis 450 cc nsr on amiodarone Stop linaglitin due to CHF Appreciate adv HF care patient examined and medical record reviewed,agree with above note. [name] 05/26/2017

## 2017-05-26 NOTE — Progress Notes (Signed)
Patient ID: Rachael Davis, female   DOB: [birth year], 61 y.o.   MRN: [id number]    Advanced Heart Failure Rounding Note   Subjective:    Admitted with volume overload, bilateral pleural effusions, and frequent PVCs.  Started on amio drip to suppress PVCs and diuresed with IV lasix.    Left thoracentesis on 6/22, 450 cc out.   Breathing better today.    ECHO 05/24/2017 EF 35-40%. Bioprothestic MV Trivial regurgitation.    Objective:   Weight Range:  Vital Signs:   Temp:  [98 F (36.7 C)-99.1 F (37.3 C)] 98 F (36.7 C) (06/23 0600) Pulse Rate:  [88-90] 90 (06/23 0600) Resp:  [18-20] 18 (06/23 0600) BP: (103-111)/(68-74) 111/74 (06/23 0600) SpO2:  [100 %] 100 % (06/23 0600) Weight:  [179 lb 8 oz (81.4 kg)] 179 lb 8 oz (81.4 kg) (06/23 0600) Last BM Date: 05/25/17  Weight change: Filed Weights   05/24/17 0554 05/25/17 0602 05/26/17 0600  Weight: 179 lb 14.4 oz (81.6 kg) 178 lb 9.6 oz (81 kg) 179 lb 8 oz (81.4 kg)    Intake/Output:   Intake/Output Summary (Last 24 hours) at 05/26/17 1224 Last data filed at 05/26/17 0500  Gross per 24 hour  Intake             1140 ml  Output              850 ml  Net              290 ml     Physical Exam: General:  Well appearing. No resp difficulty. Sitting in the chair HEENT: normal Neck: supple. JVP 8-9. Carotids 2+ bilat; no bruits. No lymphadenopathy or thryomegaly appreciated. Cor: PMI nondisplaced. Regular rate & rhythm. No rubs, gallops or murmurs. Lungs: Decreased LLL on room air.  Abdomen: soft, nontender, nondistended. No hepatosplenomegaly. No bruits or masses. Good bowel sounds. Extremities: no cyanosis, clubbing, rash.  1+ bilateral ankle edema.   Neuro: alert & orientedx3, cranial nerves grossly intact. moves all 4 extremities w/o difficulty. Affect pleasant  Telemetry: Personally reviewed, ST 100s   Labs: Basic Metabolic Panel:  Recent Labs Lab 05/23/17 1737 05/24/17 0321 05/25/17 0209 05/26/17 0146  NA  141 142 140 139  K 3.4* 3.5 3.8 4.6  CL 104 104 102 102  CO2 24 26 26 27   GLUCOSE 72 82 164* 175*  BUN 21* 21* 19 27*  CREATININE 1.25* 1.23* 1.22* 1.41*  CALCIUM 9.4 9.0 9.0 9.0  MG 1.7  --  2.4  --     Liver Function Tests:  Recent Labs Lab 05/23/17 1737  AST 19  ALT 23  ALKPHOS 111  BILITOT 0.6  PROT 7.9  ALBUMIN 3.7   No results for input(s): LIPASE, AMYLASE in the last 168 hours. No results for input(s): AMMONIA in the last 168 hours.  CBC:  Recent Labs Lab 05/23/17 1737  WBC 10.3  NEUTROABS 7.3  HGB 12.6  HCT 40.1  MCV 74.8*  PLT 233    Cardiac Enzymes: No results for input(s): CKTOTAL, CKMB, CKMBINDEX, TROPONINI in the last 168 hours.  BNP: BNP (last 3 results)  Recent Labs  05/23/17 1737  BNP 850.4*    ProBNP (last 3 results) No results for input(s): PROBNP in the last 8760 hours.    Other results:  Imaging: Dg Chest 2 View  Result Date: 05/26/2017 CLINICAL DATA:  Post mitral valve replacement. EXAM: CHEST  2 VIEW COMPARISON:  05/25/2017 FINDINGS: Evidence of previous  median sternotomy and mitral valve replacement. Lungs are adequately inflated with stable moderate size left pleural effusion likely with associated basilar atelectasis. Mild linear density over the right base likely atelectasis. Minimal prominence of the central perihilar markings. Mild stable cardiomegaly. Remainder of the exam is unchanged. IMPRESSION: Stable moderate size left pleural effusion likely with associated left basilar atelectasis. Linear atelectasis right base. Suggestion of minimal vascular congestion.  Stable cardiomegaly. Post median sternotomy and mitral valve replacement. Electronically Signed   By: Elberta Fortis M.D.   On: 05/26/2017 08:33   Dg Chest 2 View  Result Date: 05/25/2017 CLINICAL DATA:  61 year old female status post thoracentesis EXAM: CHEST  2 VIEW COMPARISON:  05/24/2017 FINDINGS: Cardiomediastinal silhouette unchanged in size and contour.  Surgical changes of prior median sternotomy, CABG, mitral valve annuloplasty. Compare to the prior, decreased opacity at the left base, though persisting basilar opacity with blunting of left costophrenic angle and the costophrenic sulcus on the lateral view. Thickening of the fissure. No pneumothorax status post thoracentesis. Right lung relatively well aerated, similar to prior. IMPRESSION: Improved left basilar opacity status post thoracentesis with no evidence of pneumothorax. Surgical changes of prior median sternotomy, CABG, mitral valve annuloplasty. Electronically Signed   By: Gilmer Mor D.O.   On: 05/25/2017 14:30   Ir Thoracentesis Asp Pleural Space W/img Guide  Result Date: 05/25/2017 INDICATION: Patient with history of CHF, and bilateral pleural effusions. Request is made for therapeutic thoracentesis. EXAM: ULTRASOUND GUIDED THERAPEUTIC RIGHT THORACENTESIS MEDICATIONS: 10 mL 1% lidocaine COMPLICATIONS: None immediate. PROCEDURE: An ultrasound guided thoracentesis was thoroughly discussed with the patient and questions answered. The benefits, risks, alternatives and complications were also discussed. The patient understands and wishes to proceed with the procedure. Written consent was obtained. Ultrasound was performed to localize and mark an adequate pocket of fluid in the right chest. The area was then prepped and draped in the normal sterile fashion. 1% Lidocaine was used for local anesthesia. Under ultrasound guidance a Safe-T-Centesis catheter was introduced. Thoracentesis was performed. The catheter was removed and a dressing applied. FINDINGS: A total of approximately 450 mL of blood-tinged fluid was removed. IMPRESSION: Successful ultrasound guided right therapeutic thoracentesis yielding 450 mL of pleural fluid. Read by:  Loyce Dys PA-C Electronically Signed   By: Malachy Moan M.D.   On: 05/25/2017 14:28     Medications:     Scheduled Medications: . atorvastatin  20 mg  Oral q1800  . carvedilol  3.125 mg Oral BID WC  . furosemide  40 mg Intravenous BID  . insulin aspart  0-15 Units Subcutaneous TID WC  . linagliptin  5 mg Oral Daily  . magnesium oxide  400 mg Oral BID  . metFORMIN  1,000 mg Oral BID WC  . potassium chloride  40 mEq Oral BID  . sodium chloride flush  3 mL Intravenous Q12H  . spironolactone  25 mg Oral Daily  . warfarin  2.5 mg Oral q1800  . Warfarin - Physician Dosing Inpatient   Does not apply q1800    Infusions: . sodium chloride    . amiodarone 30 mg/hr (05/26/17 0138)    PRN Medications: sodium chloride, acetaminophen, guaiFENesin-dextromethorphan, lidocaine, ondansetron (ZOFRAN) IV, sodium chloride flush   Assessment/Plan/Discussion:   Mrs Boniface is a 61 year old with recent CABG x4 and MVR on May 10th admitted with volume overload and Afib. EKG on admit showed Sinus Tach with PVC and not A fib.   1. A/C Systolic Heart Failure: Ischemic cardiomyopathy, ECHO  05/24/2017 EF 35-40%.  Volume status improving with IV Lasix and breathing better.  Creatinine up to 1.4 today.  - Continue IV Lasix one more day then transition to po.  - Continue Coreg and spironolactone.  - Hold off on losartan today with rise in creatinine.  2. CAD S/P CABG x4 - on coumadin so no ASA. Continue Lipitor 20 mg daily. 3. MVR: Bioprosthetic. On coumadin. INR goal 2.0-3.0. INR 2.49.  4. PVCs: Minimal now, transition amiodarone to po.  5. DM: Given CHF would recommend stopping linagliptin.   6. Bilateral Pleural Effusions: s/p left thoracentesis.  7. PAF: NSR today.  On amiodarone and warfarin.   Hopefully home soon, maybe tomorrow.   Length of Stay: 3  Marca Ancona  05/26/2017, 12:24 PM  Advanced Heart Failure Team Pager [phone] (M-F; 7a - 4p)  Please contact [facility] for night-coverage after hours (4p -7a ) and weekends on [email or web address]

## 2017-05-26 NOTE — Progress Notes (Signed)
CARDIAC REHAB PHASE I   PRE:  Rate/Rhythm: 110 ST  BP:  Sitting: 119/65        SaO2: 100 RA  MODE:  Ambulation: 500 ft   POST:  Rate/Rhythm: 115 ST  BP:  Sitting: 124/67         SaO2: 99 RA  Pt ambulated 500 ft on RA, rolling walker, standby assist, steady gait, tolerated well. Pt c/o mild DOE, some fatigue with distance, however, able to increase distance today, declined rest stop. Pt to recliner after walk, call bell within reach.   [phone] Joylene Grapes, RN, BSN 05/26/2017 1:17 PM

## 2017-05-27 LAB — BASIC METABOLIC PANEL
Anion gap: 12 (ref 5–15)
BUN: 29 mg/dL — ABNORMAL HIGH (ref 6–20)
CO2: 25 mmol/L (ref 22–32)
Calcium: 9.6 mg/dL (ref 8.9–10.3)
Chloride: 102 mmol/L (ref 101–111)
Creatinine, Ser: 1.39 mg/dL — ABNORMAL HIGH (ref 0.44–1.00)
GFR calc Af Amer: 46 mL/min — ABNORMAL LOW (ref >60)
GFR calc non Af Amer: 40 mL/min — ABNORMAL LOW (ref >60)
Glucose, Bld: 136 mg/dL — ABNORMAL HIGH (ref 65–99)
Potassium: 5.2 mmol/L — ABNORMAL HIGH (ref 3.5–5.1)
Sodium: 139 mmol/L (ref 135–145)

## 2017-05-27 LAB — GLUCOSE, CAPILLARY
Glucose-Capillary: 128 mg/dL — ABNORMAL HIGH (ref 65–99)
Glucose-Capillary: 154 mg/dL — ABNORMAL HIGH (ref 65–99)
Glucose-Capillary: 115 mg/dL — ABNORMAL HIGH (ref 65–99)
Glucose-Capillary: 85 mg/dL (ref 65–99)

## 2017-05-27 LAB — PROTIME-INR
INR: 2.37
Prothrombin Time: 26.4 seconds — ABNORMAL HIGH (ref 11.4–15.2)

## 2017-05-27 MED ORDER — POTASSIUM CHLORIDE CRYS ER 20 MEQ PO TBCR: 40.0000 meq | EXTENDED_RELEASE_TABLET | Freq: Two times a day (BID) | ORAL | Status: DC

## 2017-05-27 MED ORDER — FUROSEMIDE 40 MG PO TABS
40.0000 mg | ORAL_TABLET | Freq: Every day | ORAL | Status: DC
  Administered 2017-05-28: 40 mg via ORAL
  Filled 2017-05-27: qty 1

## 2017-05-27 MED ORDER — GLIPIZIDE ER 10 MG PO TB24
10.0000 mg | ORAL_TABLET | Freq: Every day | ORAL | Status: DC
  Administered 2017-05-27 – 2017-05-28 (×2): 10 mg via ORAL
  Filled 2017-05-27 (×2): qty 1

## 2017-05-27 NOTE — Progress Notes (Signed)
Patient ID: Rachael Davis, female   DOB: 20-Mar-1956, 61 y.o.   MRN: [id number]    Advanced Heart Failure Rounding Note   Subjective:    Admitted with volume overload, bilateral pleural effusions, and frequent PVCs.  Started on amio drip to suppress PVCs and diuresed with IV lasix. Weight down another 5 lbs.  Mild K elevation this morning.    Left thoracentesis on 6/22, 450 cc out.   Breathing better now.  Still has some chest soreness likely post-op.    ECHO 05/24/2017 EF 35-40%. Bioprothestic MV Trivial regurgitation.    Objective:   Weight Range:  Vital Signs:   Temp:  [97.8 F (36.6 C)-98.4 F (36.9 C)] 97.8 F (36.6 C) (06/24 0500) Pulse Rate:  [56-109] 91 (06/24 0500) Resp:  [18-20] 20 (06/24 0500) BP: (92-127)/(65-77) 96/65 (06/24 0500) SpO2:  [100 %] 100 % (06/24 0500) Weight:  [174 lb 9.6 oz (79.2 kg)] 174 lb 9.6 oz (79.2 kg) (06/24 0500) Last BM Date: 05/26/17  Weight change: Filed Weights   05/25/17 0602 05/26/17 0600 05/27/17 0500  Weight: 178 lb 9.6 oz (81 kg) 179 lb 8 oz (81.4 kg) 174 lb 9.6 oz (79.2 kg)    Intake/Output:   Intake/Output Summary (Last 24 hours) at 05/27/17 1124 Last data filed at 05/26/17 2114  Gross per 24 hour  Intake                0 ml  Output             1180 ml  Net            -1180 ml     Physical Exam: General:  Well appearing. No resp difficulty. Sitting in the chair HEENT: normal Neck: supple. JVP 7. Carotids 2+ bilat; no bruits. No lymphadenopathy or thryomegaly appreciated. Cor: PMI nondisplaced. Regular rate & rhythm. No rubs, gallops or murmurs. Lungs: Decreased breath sounds left base.  Abdomen: soft, nontender, nondistended. No hepatosplenomegaly. No bruits or masses. Good bowel sounds. Extremities: no cyanosis, clubbing, rash.  1+ bilateral ankle edema.   Neuro: alert & orientedx3, cranial nerves grossly intact. moves all 4 extremities w/o difficulty. Affect pleasant  Telemetry: Personally reviewed, ST 100s    Labs: Basic Metabolic Panel:  Recent Labs Lab 05/23/17 1737 05/24/17 0321 05/25/17 0209 05/26/17 0146 05/27/17 0520  NA 141 142 140 139 139  K 3.4* 3.5 3.8 4.6 5.2*  CL 104 104 102 102 102  CO2 24 26 26 27 25   GLUCOSE 72 82 164* 175* 136*  BUN 21* 21* 19 27* 29*  CREATININE 1.25* 1.23* 1.22* 1.41* 1.39*  CALCIUM 9.4 9.0 9.0 9.0 9.6  MG 1.7  --  2.4  --   --     Liver Function Tests:  Recent Labs Lab 05/23/17 1737  AST 19  ALT 23  ALKPHOS 111  BILITOT 0.6  PROT 7.9  ALBUMIN 3.7   No results for input(s): LIPASE, AMYLASE in the last 168 hours. No results for input(s): AMMONIA in the last 168 hours.  CBC:  Recent Labs Lab 05/23/17 1737  WBC 10.3  NEUTROABS 7.3  HGB 12.6  HCT 40.1  MCV 74.8*  PLT 233    Cardiac Enzymes: No results for input(s): CKTOTAL, CKMB, CKMBINDEX, TROPONINI in the last 168 hours.  BNP: BNP (last 3 results)  Recent Labs  05/23/17 1737  BNP 850.4*    ProBNP (last 3 results) No results for input(s): PROBNP in the last 8760 hours.  Other results:  Imaging: Dg Chest 2 View  Result Date: 05/26/2017 CLINICAL DATA:  Post mitral valve replacement. EXAM: CHEST  2 VIEW COMPARISON:  05/25/2017 FINDINGS: Evidence of previous median sternotomy and mitral valve replacement. Lungs are adequately inflated with stable moderate size left pleural effusion likely with associated basilar atelectasis. Mild linear density over the right base likely atelectasis. Minimal prominence of the central perihilar markings. Mild stable cardiomegaly. Remainder of the exam is unchanged. IMPRESSION: Stable moderate size left pleural effusion likely with associated left basilar atelectasis. Linear atelectasis right base. Suggestion of minimal vascular congestion.  Stable cardiomegaly. Post median sternotomy and mitral valve replacement. Electronically Signed   By: Elberta Fortis M.D.   On: 05/26/2017 08:33   Dg Chest 2 View  Result Date:  05/25/2017 CLINICAL DATA:  61 year old female status post thoracentesis EXAM: CHEST  2 VIEW COMPARISON:  05/24/2017 FINDINGS: Cardiomediastinal silhouette unchanged in size and contour. Surgical changes of prior median sternotomy, CABG, mitral valve annuloplasty. Compare to the prior, decreased opacity at the left base, though persisting basilar opacity with blunting of left costophrenic angle and the costophrenic sulcus on the lateral view. Thickening of the fissure. No pneumothorax status post thoracentesis. Right lung relatively well aerated, similar to prior. IMPRESSION: Improved left basilar opacity status post thoracentesis with no evidence of pneumothorax. Surgical changes of prior median sternotomy, CABG, mitral valve annuloplasty. Electronically Signed   By: Gilmer Mor D.O.   On: 05/25/2017 14:30   Ir Thoracentesis Asp Pleural Space W/img Guide  Result Date: 05/25/2017 INDICATION: Patient with history of CHF, and bilateral pleural effusions. Request is made for therapeutic thoracentesis. EXAM: ULTRASOUND GUIDED THERAPEUTIC RIGHT THORACENTESIS MEDICATIONS: 10 mL 1% lidocaine COMPLICATIONS: None immediate. PROCEDURE: An ultrasound guided thoracentesis was thoroughly discussed with the patient and questions answered. The benefits, risks, alternatives and complications were also discussed. The patient understands and wishes to proceed with the procedure. Written consent was obtained. Ultrasound was performed to localize and mark an adequate pocket of fluid in the right chest. The area was then prepped and draped in the normal sterile fashion. 1% Lidocaine was used for local anesthesia. Under ultrasound guidance a Safe-T-Centesis catheter was introduced. Thoracentesis was performed. The catheter was removed and a dressing applied. FINDINGS: A total of approximately 450 mL of blood-tinged fluid was removed. IMPRESSION: Successful ultrasound guided right therapeutic thoracentesis yielding 450 mL of pleural  fluid. Read by:  Loyce Dys PA-C Electronically Signed   By: Malachy Moan M.D.   On: 05/25/2017 14:28     Medications:     Scheduled Medications: . amiodarone  200 mg Oral BID  . atorvastatin  20 mg Oral q1800  . carvedilol  3.125 mg Oral BID WC  . [START ON 05/28/2017] furosemide  40 mg Oral Daily  . glipiZIDE  10 mg Oral Q breakfast  . insulin aspart  0-15 Units Subcutaneous TID WC  . magnesium oxide  400 mg Oral BID  . metFORMIN  1,000 mg Oral BID WC  . sodium chloride flush  3 mL Intravenous Q12H  . spironolactone  25 mg Oral Daily  . warfarin  2.5 mg Oral q1800  . Warfarin - Physician Dosing Inpatient   Does not apply q1800    Infusions: . sodium chloride      PRN Medications: sodium chloride, acetaminophen, guaiFENesin-dextromethorphan, lidocaine, ondansetron (ZOFRAN) IV, sodium chloride flush   Assessment/Plan/Discussion:   Mrs Hecht is a 61 year old with recent CABG x4 and MVR on May 10th admitted  with volume overload and Afib. EKG on admit showed Sinus Tach with PVC and not A fib.   1. 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  Volume status improving with IV Lasix and breathing better.    - Got IV Lasix this morning.  Can stop Lasix now, transition to Lasix 40 mg po daily tomorrow.  - Continue Coreg and spironolactone.  - Will not start losartan with mildly elevated K and stop K supplement.  2. CAD S/P CABG x4: on coumadin so no ASA. Continue Lipitor 20 mg daily. 3. MVR: Bioprosthetic. On coumadin. INR goal 2.0-3.0.  4. PVCs: Minimal now, on po amiodarone.  5. DM: Given CHF stopped linagliptin.   6. Bilateral Pleural Effusions: s/p left thoracentesis.  7. PAF: NSR today.  On amiodarone and warfarin.   May go home soon, may want to wait until tomorrow to make sure electrolytes stable but could send home today with BMET soon.    Length of Stay: 4  Marca Ancona  05/27/2017, 11:24 AM  Advanced Heart Failure  Team Pager [phone] (M-F; 7a - 4p)  Please contact [facility] for night-coverage after hours (4p -7a ) and weekends on [email or web address]

## 2017-05-27 NOTE — Progress Notes (Addendum)
[address]       Rachael Davis [address]             [phone]         Subjective: conts to feel better, SOB improved  Objective: Vital signs in last 24 hours: Temp:  [97.8 F (36.6 C)-98.4 F (36.9 C)] 97.8 F (36.6 C) (06/24 0500) Pulse Rate:  [56-109] 91 (06/24 0500) Cardiac Rhythm: Normal sinus rhythm (06/23 2030) Resp:  [18-20] 20 (06/24 0500) BP: (92-127)/(65-77) 96/65 (06/24 0500) SpO2:  [100 %] 100 % (06/24 0500) Weight:  [174 lb 9.6 oz (79.2 kg)] 174 lb 9.6 oz (79.2 kg) (06/24 0500)  Hemodynamic parameters for last 24 hours:    Intake/Output from previous day: 06/23 0701 - 06/24 0700 In: -  Out: 1180 [Urine:1180] Intake/Output this shift: No intake/output data recorded.  General appearance: alert, cooperative and no distress Heart: regular rate and rhythm Lungs: dim in bases Abdomen: benign Extremities: edema conts to improve Wound: incis healed  Lab Results: No results for input(s): WBC, HGB, HCT, PLT in the last 72 hours. BMET:  Recent Labs  05/26/17 0146 05/27/17 0520  NA 139 139  K 4.6 5.2*  CL 102 102  CO2 27 25  GLUCOSE 175* 136*  BUN 27* 29*  CREATININE 1.41* 1.39*  CALCIUM 9.0 9.6    PT/INR:  Recent Labs  05/27/17 0520  LABPROT 26.4*  INR 2.37   ABG    Component Value Date/Time   PHART 7.366 04/13/2017 0335   HCO3 20.2 04/13/2017 0335   TCO2 21 04/13/2017 1711   ACIDBASEDEF 4.2 (H) 04/13/2017 0335   O2SAT 64.6 04/15/2017 0515   CBG (last 3)   Recent Labs  05/26/17 1716 05/26/17 2127 05/27/17 0620  GLUCAP 161* 165* 128*    Meds Scheduled Meds: . amiodarone  200 mg Oral BID  . atorvastatin  20 mg Oral q1800  . carvedilol  3.125 mg Oral BID WC  . furosemide  40 mg Intravenous BID  . insulin aspart  0-15 Units Subcutaneous TID WC  . magnesium oxide  400 mg Oral BID  . metFORMIN  1,000 mg Oral BID WC  . potassium chloride  40 mEq Oral BID  . sodium chloride flush  3 mL Intravenous Q12H  .  spironolactone  25 mg Oral Daily  . warfarin  2.5 mg Oral q1800  . Warfarin - Physician Dosing Inpatient   Does not apply q1800   Continuous Infusions: . sodium chloride     PRN Meds:.sodium chloride, acetaminophen, guaiFENesin-dextromethorphan, lidocaine, ondansetron (ZOFRAN) IV, sodium chloride flush  Xrays Dg Chest 2 View  Result Date: 05/26/2017 CLINICAL DATA:  Post mitral valve replacement. EXAM: CHEST  2 VIEW COMPARISON:  05/25/2017 FINDINGS: Evidence of previous median sternotomy and mitral valve replacement. Lungs are adequately inflated with stable moderate size left pleural effusion likely with associated basilar atelectasis. Mild linear density over the right base likely atelectasis. Minimal prominence of the central perihilar markings. Mild stable cardiomegaly. Remainder of the exam is unchanged. IMPRESSION: Stable moderate size left pleural effusion likely with associated left basilar atelectasis. Linear atelectasis right base. Suggestion of minimal vascular congestion.  Stable cardiomegaly. Post median sternotomy and mitral valve replacement. Electronically Signed   By: Elberta Fortis M.D.   On: 05/26/2017 08:33   Dg Chest 2 View  Result Date: 05/25/2017 CLINICAL DATA:  61 year old female status post thoracentesis EXAM: CHEST  2 VIEW COMPARISON:  05/24/2017 FINDINGS: Cardiomediastinal silhouette unchanged  in size and contour. Surgical changes of prior median sternotomy, CABG, mitral valve annuloplasty. Compare to the prior, decreased opacity at the left base, though persisting basilar opacity with blunting of left costophrenic angle and the costophrenic sulcus on the lateral view. Thickening of the fissure. No pneumothorax status post thoracentesis. Right lung relatively well aerated, similar to prior. IMPRESSION: Improved left basilar opacity status post thoracentesis with no evidence of pneumothorax. Surgical changes of prior median sternotomy, CABG, mitral valve annuloplasty.  Electronically Signed   By: Gilmer Mor D.O.   On: 05/25/2017 14:30   Ir Thoracentesis Asp Pleural Space W/img Guide  Result Date: 05/25/2017 INDICATION: Patient with history of CHF, and bilateral pleural effusions. Request is made for therapeutic thoracentesis. EXAM: ULTRASOUND GUIDED THERAPEUTIC RIGHT THORACENTESIS MEDICATIONS: 10 mL 1% lidocaine COMPLICATIONS: None immediate. PROCEDURE: An ultrasound guided thoracentesis was thoroughly discussed with the patient and questions answered. The benefits, risks, alternatives and complications were also discussed. The patient understands and wishes to proceed with the procedure. Written consent was obtained. Ultrasound was performed to localize and mark an adequate pocket of fluid in the right chest. The area was then prepped and draped in the normal sterile fashion. 1% Lidocaine was used for local anesthesia. Under ultrasound guidance a Safe-T-Centesis catheter was introduced. Thoracentesis was performed. The catheter was removed and a dressing applied. FINDINGS: A total of approximately 450 mL of blood-tinged fluid was removed. IMPRESSION: Successful ultrasound guided right therapeutic thoracentesis yielding 450 mL of pleural fluid. Read by:  Loyce Dys PA-C Electronically Signed   By: Malachy Moan M.D.   On: 05/25/2017 14:28    Assessment/Plan:    1 steady progress conts  2 AHF management  3 sinus with frequent PVC's on amio 4 creat about the same 5 CBG's adeq control on metformin/SSI - will restart glucotrol XL but not DPP4-I or SGLT-2 inhib - she needs improved diet - will discuss further CHO restriction 6 poss home later today or tomorrow    LOS: 4 days    Rachael Davis 05/27/2017 patient examined and medical record reviewed,agree with above note. Rachael Davis 05/27/2017

## 2017-05-27 NOTE — Discharge Summary (Signed)
Physician Discharge Summary  Patient ID: Rachael Davis MRN: [id number] DOB/AGE: [birth year] 61 y.o.  Admit date: 05/23/2017 Discharge date: 05/28/2017  Admission Diagnoses: Congestive heart failure  Discharge Diagnoses:  Active Problems:   Systolic and diastolic CHF, acute on chronic [facility])   Patient Active Problem List   Diagnosis Date Noted  . Systolic and diastolic CHF, acute on chronic (HCC) 05/23/2017  . Volume overload 05/08/2017  . Long term (current) use of anticoagulants [Z79.01] 04/23/2017  . 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  . Essential hypertension 04/04/2017  . 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   History of Present Illness:     Rachael Davis is a 61 year old female with a past surgical history of CABG 4 and mitral valve replacement with Dr. Donata Clay on 04/12/2017. She has a past medical history of diabetes mellitus, hypertension, hyperlipidemia, diabetic neuropathy, and congestive heart failure with a left ventricular ejection fraction of 40% preop. She presented to the office today for a routine 4 week follow-up appointment. She states that her shortness of breath has remained the same despite being on daily diuretics. She is very fatigued and is sleeping very little night. She shares she's had anxiety especially at bedtime and feels that her heart is beating hard. Sometimes she can feel her heart beat in her throat and chest. Her anxiety at night is mostly due to her shortness of breath and prominent heart beat. She required an aggressive diuretic regimen while inpatient after surgery but continues to have 2-3+ pitting pedal and lower leg edema in bilateral extremities. Her chest x-ray showed bilateral pleural effusions. Also, her fifth sternal wire may have pulled through the bone. She does have some instability on palpation of her chest. The patient shares she has been coughing quite frequently and  thinks that it may be due to her lisinopril. She is on chronic anticoagulation and her most recent INR is 2.2. Her Coumadin was initiated due to her mitral valve replacement however, she was found to have an irregular heartbeat on physical exam today. We obtained a rhythm strip in the office which showed possible atrial fibrillation with PVCs. She did not have a history of atrial fibrillation while inpatient post surgery. We have consulted Heart failure to assist in medical management of the patient.    Discharged Condition: good  Hospital Course: The patient was admitted from the office for further management of her congestive heart failure. We consulted the advanced heart failure team to assist with this management. A repeat echocardiogram was done during the hospitalization which showed ejection fraction to be in the 35-40% range and only trace mitral insufficiency. She has required an aggressive diuresis as well as multiple adjustments in her medication. She has been started on amiodarone to assist with suppressing frequent PVCs. Both potassium and magnesium has been supplemented. She has been started on Coreg. She has been started on Aldactone in addition to her Lasix. She is not felt at this time to be a candidate for ACE inhibitor or or ARB due to some renal insufficiency. She continues on her oral Coumadin dosing for her bioprosthetic valve. Her current Coumadin dose is 2.5 mg;however, her INR increased from 2.37 to 2.67 today. As a result, she will be instructed to take Coumadin 2 mg daily, starting 05/29/2017. I have arranged for her to have a PT and INR drawn on Wednesday 06//27/2018 at [facility]. She has shown a good and gradual  improvement over time and clinically feels much better. On admission, it was noted that she also had a moderate sized pleural effusion on chest x-ray. She did undergo a thoracentesis by interventional radiology and 450 cc of fluid. Heart failure was consulted  and they managed her diuresis. As discussed with Dr. Gala Romney, patient is to be discharged on Lasix 40 mg bid. She will not take a potassium supplement as her last potassium was 4.7. In addition, she is also on Aldactone. Per Dr. Gala Romney, she should continue daily Magnesium 400 mg once daily. Chest x ray obtained today showed persistent pleural effusions (L>R), although smaller from previously. There was no pneumothorax and some elevation of hemidiaphragm. The patient was discharged on ecasa 325 mg daily, but I called the patient to tell her to take 81 mg daily, as she is on Coumadin. At time of discharge, the patient is felt to be quite stable.  Consults: Heart failure  Significant Diagnostic Studies: Echocardiography  Treatments: Left thoracentesis  Discharge Exam: Blood pressure 105/60, pulse 92, temperature 97.5 F (36.4 C), temperature source Oral, resp. rate 18, height 5\' 3"  (1.6 m), weight 80.6 kg (177 lb 9.6 oz), SpO2 99 %.  Cardiovascular: RRR Pulmonary: Diminshed at bases Abdomen: Soft, non tender, bowel sounds present. Extremities: Mild bilateral lower extremity edema, ted hose in place Wounds: Clean and dry.  No erythema or signs of infection.  Disposition: 01-Home or Self Care   Allergies as of 05/28/2017      Reactions   Lisinopril Cough      Medication List    STOP taking these medications   lisinopril 10 MG tablet Commonly known as:  PRINIVIL,ZESTRIL   metoprolol tartrate 25 MG tablet Commonly known as:  LOPRESSOR   potassium chloride SA 20 MEQ tablet Commonly known as:  K-DUR,KLOR-CON     TAKE these medications   acetaminophen 325 MG tablet Commonly known as:  TYLENOL Take 2 tablets (650 mg total) by mouth every 6 (six) hours as needed for mild pain or headache.   amiodarone 200 MG tablet Commonly known as:  PACERONE Take Amiodarone 200 mg by mouth two times daily for 5 days;then take 200 mg by mouth daily thereafter   aspirin 325 MG EC tablet Take  1 tablet (325 mg total) by mouth daily.   atorvastatin 20 MG tablet Commonly known as:  LIPITOR Take 1 tablet (20 mg total) by mouth daily at 6 PM.   carvedilol 3.125 MG tablet Commonly known as:  COREG Take 1 tablet (3.125 mg total) by mouth 2 (two) times daily with a meal.   FARXIGA 10 MG Tabs tablet Generic drug:  dapagliflozin propanediol Take 10 mg by mouth daily.   furosemide 40 MG tablet Commonly known as:  LASIX Take 1 tablet (40 mg total) by mouth 2 (two) times daily. What changed:  when to take this   glipiZIDE 10 MG 24 hr tablet Commonly known as:  GLUCOTROL XL Take 1 tablet (10 mg total) by mouth daily with breakfast. What changed:  when to take this   magnesium oxide 400 (241.3 Mg) MG tablet Commonly known as:  MAG-OX Take 1 tablet (400 mg total) by mouth daily.   metFORMIN 1000 MG tablet Commonly known as:  GLUCOPHAGE Take 1,000 mg by mouth 2 (two) times daily with a meal.   ONGLYZA 5 MG Tabs tablet Generic drug:  saxagliptin HCl Take 5 mg by mouth daily.   spironolactone 25 MG tablet Commonly known as:  ALDACTONE Take  1 tablet (25 mg total) by mouth daily. Start taking on:  05/29/2017   traMADol 50 MG tablet Commonly known as:  ULTRAM Take 1 tablet (50 mg total) by mouth every 6 (six) hours as needed for severe pain. What changed:  how much to take  when to take this  reasons to take this   warfarin 2 MG tablet Commonly known as:  COUMADIN Take 1 tablet (2 mg total) by mouth daily. Or as directed. Start taking on:  05/29/2017 What changed:  medication strength  how much to take  additional instructions      Follow-up Information    Bensimhon, Bevelyn Buckles, MD Follow up on 06/05/2017.   Specialty:  Cardiology Why:  10:00 am Heart Failure Clinic located on the 1st floor of the hospital. [facility].  Garage Code [id number] Contact information: [address] [place] [address] [phone]        Donata Clay,  Theron Arista, MD Follow up on 06/13/2017.   Specialty:  Cardiothoracic Surgery Why:  PA/LAT CXR to be taken (at [facility] which is in the same building as Dr. Zenaida Niece Trigt's office) on 06/13/2017 at 11:30 am;Appointment time is at 12:00 pm Contact information: [address] [employer] [address] [place] [address] [phone]        [facility] Follow up on 05/30/2017.   Specialty:  Cardiology Why:  Appointment time is at 12:00 and is to have the PT and INR drawn (on Coumadin, INR 2-2.5) Contact information: [address] [place] [address] [phone]          Signed: Ardelle Balls PA-C 05/28/2017, 3:54 PM

## 2017-05-28 ENCOUNTER — Inpatient Hospital Stay (HOSPITAL_COMMUNITY): Payer: Managed Care, Other (non HMO)

## 2017-05-28 LAB — BASIC METABOLIC PANEL
Anion gap: 11 (ref 5–15)
BUN: 35 mg/dL — ABNORMAL HIGH (ref 6–20)
CO2: 24 mmol/L (ref 22–32)
Calcium: 9.5 mg/dL (ref 8.9–10.3)
Chloride: 102 mmol/L (ref 101–111)
Creatinine, Ser: 1.42 mg/dL — ABNORMAL HIGH (ref 0.44–1.00)
GFR calc Af Amer: 45 mL/min — ABNORMAL LOW (ref >60)
GFR calc non Af Amer: 39 mL/min — ABNORMAL LOW (ref >60)
Glucose, Bld: 86 mg/dL (ref 65–99)
Potassium: 4.7 mmol/L (ref 3.5–5.1)
Sodium: 137 mmol/L (ref 135–145)

## 2017-05-28 LAB — GLUCOSE, CAPILLARY
Glucose-Capillary: 74 mg/dL (ref 65–99)
Glucose-Capillary: 110 mg/dL — ABNORMAL HIGH (ref 65–99)

## 2017-05-28 LAB — PROTIME-INR
INR: 2.67
Prothrombin Time: 28.9 seconds — ABNORMAL HIGH (ref 11.4–15.2)

## 2017-05-28 MED ORDER — WARFARIN SODIUM 1 MG PO TABS: 1.0000 mg | ORAL_TABLET | Freq: Every day | ORAL | Status: DC

## 2017-05-28 MED ORDER — GLIPIZIDE ER 10 MG PO TB24: 10.0000 mg | ORAL_TABLET | Freq: Every day | ORAL | Status: AC

## 2017-05-28 MED ORDER — WARFARIN SODIUM 2 MG PO TABS: 2.0000 mg | ORAL_TABLET | Freq: Every day | ORAL | 1 refills | Status: DC

## 2017-05-28 MED ORDER — ACETAMINOPHEN 325 MG PO TABS: 650.0000 mg | ORAL_TABLET | ORAL | Status: DC | PRN

## 2017-05-28 MED ORDER — TRAMADOL HCL 50 MG PO TABS: 50.0000 mg | ORAL_TABLET | Freq: Four times a day (QID) | ORAL | 0 refills | Status: DC | PRN

## 2017-05-28 MED ORDER — FUROSEMIDE 40 MG PO TABS: 40.0000 mg | ORAL_TABLET | Freq: Two times a day (BID) | ORAL | 1 refills | Status: DC

## 2017-05-28 MED ORDER — AMIODARONE HCL 200 MG PO TABS: ORAL_TABLET | ORAL | 1 refills | Status: DC

## 2017-05-28 MED ORDER — MAGNESIUM OXIDE 400 (241.3 MG) MG PO TABS: 400.0000 mg | ORAL_TABLET | Freq: Every day | ORAL | 0 refills | Status: DC

## 2017-05-28 MED ORDER — CARVEDILOL 3.125 MG PO TABS: 3.1250 mg | ORAL_TABLET | Freq: Two times a day (BID) | ORAL | 1 refills | Status: DC

## 2017-05-28 MED ORDER — SPIRONOLACTONE 25 MG PO TABS: 25.0000 mg | ORAL_TABLET | Freq: Every day | ORAL | 1 refills | Status: DC

## 2017-05-28 MED ORDER — ACETAMINOPHEN 325 MG PO TABS: 650.0000 mg | ORAL_TABLET | Freq: Four times a day (QID) | ORAL | Status: AC | PRN

## 2017-05-28 NOTE — Progress Notes (Addendum)
      [address]       [name] [address]             [phone]            Subjective: Patient states feeling better, breathing better, and legs much less swollen.  Objective: Vital signs in last 24 hours: Temp:  [98 F (36.7 C)-98.4 F (36.9 C)] 98 F (36.7 C) (06/25 0311) Pulse Rate:  [91] 91 (06/25 0311) Cardiac Rhythm: Normal sinus rhythm;Heart block (06/24 2100) Resp:  [16-18] 16 (06/25 0311) BP: (106-108)/(70-71) 106/71 (06/25 0311) SpO2:  [100 %] 100 % (06/25 0311) Weight:  [80.6 kg (177 lb 9.6 oz)] 80.6 kg (177 lb 9.6 oz) (06/25 0500)   Current Weight  05/28/17 80.6 kg (177 lb 9.6 oz)      Intake/Output from previous day: 06/24 0701 - 06/25 0700 In: 600 [P.O.:600] Out: 1250 [Urine:1250]   Physical Exam:  Cardiovascular: RRR Pulmonary: Diminshed at bases Abdomen: Soft, non tender, bowel sounds present. Extremities: Mild bilateral lower extremity edema, ted hose in place Wounds: Clean and dry.  No erythema or signs of infection.  Lab Results: CBC:No results for input(s): WBC, HGB, HCT, PLT in the last 72 hours. BMET:  Recent Labs  05/27/17 0520 05/28/17 0256  NA 139 137  K 5.2* 4.7  CL 102 102  CO2 25 24  GLUCOSE 136* 86  BUN 29* 35*  CREATININE 1.39* 1.42*  CALCIUM 9.6 9.5    PT/INR:  Lab Results  Component Value Date   INR 2.67 05/28/2017   INR 2.37 05/27/2017   INR 2.22 05/26/2017   ABG:  INR: Will add last result for INR, ABG once components are confirmed Will add last 4 CBG results once components are confirmed  Assessment/Plan:  1. CV - PVCs, first degree heart block, a fib. SR/PVCs in the low 90's this am. On Amiodarone 200 mg bid, Coreg 3.125 mg bid, Spironolactone 25 mg daily, and Coumadin. INR slightly increased from 2.37 to 2.67. Will give reduced dose Coumadin tonight. Will likely need 2 mg of Coumadin at discharge. QTc 468. 2.  Pulmonary - On room air. Will check CXR to make sure bilateral pleural  effusions have improved and last CXR 06/23. 3. Recurrent CHF-Previously diuresed with IV Lasix now on Lasix 40 mg daily. Per Dr. Gala Romney, Lasix 40 mg bid at discharge. 4.  Potassium decreased from 5.2 to 4.7. 5. Creatinine slightly increased from 1.37 to 1.42. 6. DM-CBGs 115/85/74. Continue Glipizide 10 mg daily and Metformin 1000 mg bid. 7. As discussed with Dr. Donata Clay, discharge home  Caetano Oberhaus MPA-C 05/28/2017,7:14 AM

## 2017-05-28 NOTE — Discharge Instructions (Signed)
Heart Failure °Heart failure means your heart has trouble pumping blood. This makes it hard for your body to work well. Heart failure is usually a long-term (chronic) condition. You must take good care of yourself and follow your doctor's treatment plan. °Follow these instructions at home: °· Take your heart medicine as told by your doctor. °? Do not stop taking medicine unless your doctor tells you to. °? Do not skip any dose of medicine. °? Refill your medicines before they run out. °? Take other medicines only as told by your doctor or pharmacist. °· Stay active if told by your doctor. The elderly and people with severe heart failure should talk with a doctor about physical activity. °· Eat heart-healthy foods. Choose foods that are without trans fat and are low in saturated fat, cholesterol, and salt (sodium). This includes fresh or frozen fruits and vegetables, fish, lean meats, fat-free or low-fat dairy foods, whole grains, and high-fiber foods. Lentils and dried peas and beans (legumes) are also good choices. °· Limit salt if told by your doctor. °· Cook in a healthy way. Roast, grill, broil, bake, poach, steam, or stir-fry foods. °· Limit fluids as told by your doctor. °· Weigh yourself every morning. Do this after you pee (urinate) and before you eat breakfast. Write down your weight to give to your doctor. °· Take your blood pressure and write it down if your doctor tells you to. °· Ask your doctor how to check your pulse. Check your pulse as told. °· Lose weight if told by your doctor. °· Stop smoking or chewing tobacco. Do not use gum or patches that help you quit without your doctor's approval. °· Schedule and go to doctor visits as told. °· Nonpregnant women should have no more than 1 drink a day. Men should have no more than 2 drinks a day. Talk to your doctor about drinking alcohol. °· Stop illegal drug use. °· Stay current with shots (immunizations). °· Manage your health conditions as told by your  doctor. °· Learn to manage your stress. °· Rest when you are tired. °· If it is really hot outside: °? Avoid intense activities. °? Use air conditioning or fans, or get in a cooler place. °? Avoid caffeine and alcohol. °? Wear loose-fitting, lightweight, and light-colored clothing. °· If it is really cold outside: °? Avoid intense activities. °? Layer your clothing. °? Wear mittens or gloves, a hat, and a scarf when going outside. °? Avoid alcohol. °· Learn about heart failure and get support as needed. °· Get help to maintain or improve your quality of life and your ability to care for yourself as needed. °Contact a doctor if: °· You gain weight quickly. °· You are more short of breath than usual. °· You cannot do your normal activities. °· You tire easily. °· You cough more than normal, especially with activity. °· You have any or more puffiness (swelling) in areas such as your hands, feet, ankles, or belly (abdomen). °· You cannot sleep because it is hard to breathe. °· You feel like your heart is beating fast (palpitations). °· You get dizzy or light-headed when you stand up. °Get help right away if: °· You have trouble breathing. °· There is a change in mental status, such as becoming less alert or not being able to focus. °· You have chest pain or discomfort. °· You faint. °This information is not intended to replace advice given to you by your health care provider. Make sure you   discuss any questions you have with your health care provider. Document Released: 08/29/2008 Document Revised: 04/27/2016 Document Reviewed: 01/06/2013 Elsevier Interactive Patient Education  2017 [employer].  Information on my medicine - Coumadin   (Warfarin)  This medication education was reviewed with me or my healthcare representative as part of my discharge preparation.  The pharmacist that spoke with me during my hospital stay was:  Severiano Gilbert, [facility]  Why was Coumadin prescribed for you? Coumadin was  prescribed for you because you have a blood clot or a medical condition that can cause an increased risk of forming blood clots. Blood clots can cause serious health problems by blocking the flow of blood to the heart, lung, or brain. Coumadin can prevent harmful blood clots from forming. As a reminder your indication for Coumadin is:   Blood Clot Prevention After Heart Valve Surgery  What test will check on my response to Coumadin? While on Coumadin (warfarin) you will need to have an INR test regularly to ensure that your dose is keeping you in the desired range. The INR (international normalized ratio) number is calculated from the result of the laboratory test called prothrombin time (PT).  If an INR APPOINTMENT HAS NOT ALREADY BEEN MADE FOR YOU please schedule an appointment to have this lab work done by your health care provider within 7 days. Your INR goal is a number between:  2.5 to 3. Ask your health care provider during an office visit what your goal INR is.  What  do you need to  know  About  COUMADIN? Take Coumadin (warfarin) exactly as prescribed by your healthcare provider about the same time each day.  DO NOT stop taking without talking to the doctor who prescribed the medication.  Stopping without other blood clot prevention medication to take the place of Coumadin may increase your risk of developing a new clot or stroke.  Get refills before you run out.  What do you do if you miss a dose? If you miss a dose, take it as soon as you remember on the same day then continue your regularly scheduled regimen the next day.  Do not take two doses of Coumadin at the same time.  Important Safety Information A possible side effect of Coumadin (Warfarin) is an increased risk of bleeding. You should call your healthcare provider right away if you experience any of the following: ? Bleeding from an injury or your nose that does not stop. ? Unusual colored urine (red or dark brown) or unusual  colored stools (red or black). ? Unusual bruising for unknown reasons. ? A serious fall or if you hit your head (even if there is no bleeding).  Some foods or medicines interact with Coumadin (warfarin) and might alter your response to warfarin. To help avoid this: ? Eat a balanced diet, maintaining a consistent amount of Vitamin K. ? Notify your provider about major diet changes you plan to make. ? Avoid alcohol or limit your intake to 1 drink for women and 2 drinks for men per day. (1 drink is 5 oz. wine, 12 oz. beer, or 1.5 oz. liquor.)  Make sure that ANY health care provider who prescribes medication for you knows that you are taking Coumadin (warfarin).  Also make sure the healthcare provider who is monitoring your Coumadin knows when you have started a new medication including herbals and non-prescription products.  Coumadin (Warfarin)  Major Drug Interactions  Increased Warfarin Effect Decreased Warfarin Effect  Alcohol (large  quantities) Antibiotics (esp. Septra/Bactrim, Flagyl, Cipro) Amiodarone (Cordarone) Aspirin (ASA) Cimetidine (Tagamet) Megestrol (Megace) NSAIDs (ibuprofen, naproxen, etc.) Piroxicam (Feldene) Propafenone (Rythmol SR) Propranolol (Inderal) Isoniazid (INH) Posaconazole (Noxafil) Barbiturates (Phenobarbital) Carbamazepine (Tegretol) Chlordiazepoxide (Librium) Cholestyramine (Questran) Griseofulvin Oral Contraceptives Rifampin Sucralfate (Carafate) Vitamin K   Coumadin (Warfarin) Major Herbal Interactions  Increased Warfarin Effect Decreased Warfarin Effect  Garlic Ginseng Ginkgo biloba Coenzyme Q10 Green tea St. Johns wort    Coumadin (Warfarin) FOOD Interactions  Eat a consistent number of servings per week of foods HIGH in Vitamin K (1 serving =  cup)  Collards (cooked, or boiled & drained) Kale (cooked, or boiled & drained) Mustard greens (cooked, or boiled & drained) Parsley *serving size only =  cup Spinach (cooked, or boiled  & drained) Swiss chard (cooked, or boiled & drained) Turnip greens (cooked, or boiled & drained)  Eat a consistent number of servings per week of foods MEDIUM-HIGH in Vitamin K (1 serving = 1 cup)  Asparagus (cooked, or boiled & drained) Broccoli (cooked, boiled & drained, or raw & chopped) Brussel sprouts (cooked, or boiled & drained) *serving size only =  cup Lettuce, raw (green leaf, endive, romaine) Spinach, raw Turnip greens, raw & chopped   These websites have more information on Coumadin (warfarin):  http://www.king-russell.com/; https://www.hines.net/;

## 2017-05-28 NOTE — Plan of Care (Signed)
Problem: Activity: Goal: Risk for activity intolerance will decrease Outcome: Progressing Patient ambulated twice in the hall way. Tolerated well.

## 2017-05-28 NOTE — Progress Notes (Signed)
Order received to discharge patient.  PIV access removed.  Telemetry monitor removed and CCMD notified.  Discharge instructions, follow up, medications and instructions for their use were discussed with patient. 

## 2017-05-28 NOTE — Progress Notes (Signed)
[id number] Went back to visit pt to review CHF booklet and low sodium diet handouts given Friday. Encouraged pt to weigh daily. Reviewed signs and symptoms of CHF. Discussed 2000 mg sodium restriction. Gave walking guidelines. Discussed when to notify MD with symptoms. Luetta Nutting RN BSN 05/28/2017 9:23 AM

## 2017-05-28 NOTE — Progress Notes (Signed)
CARDIAC REHAB PHASE I   PRE:  Rate/Rhythm: 91 SR PVCs  BP:  Supine:   Sitting: 92/60  Standing:    SaO2: 100%RA  MODE:  Ambulation: 550 ft   POST:  Rate/Rhythm: 101 ST PVCs  BP:  Supine:   Sitting: 110/70  Standing:    SaO2: 98%RA 0840-0905 Pt walked 550 ft with rolling walker with steady gait. Did not need to rest. Tolerated well. No complaints. No noted SOB. Encouraged walking at home. Will send CRP  2 [place] an update as pt still interested in attending. She stated she has completed home PT.   Luetta Nutting, RN BSN  05/28/2017 9:01 AM

## 2017-05-28 NOTE — Progress Notes (Signed)
Patient ID: Rachael Davis, female   DOB: August 05, 1956, 61 y.o.   MRN: [id number]    Advanced Heart Failure Rounding Note   Subjective:    Admitted with volume overload, bilateral pleural effusions, and frequent PVCs.  Started on amio drip to suppress PVCs and diuresed with IV lasix. Transitioned to po Amio 6/23. Weight down 6 pounds total.   Left thoracentesis on 6/22, 450 cc out.   Feels well today, walked with cardiac rehab and did well. Denies SOB, chest pain. PVC's per hour improved, now 1-5 per hour.  ECHO 05/24/2017 EF 35-40%. Bioprothestic MV Trivial regurgitation.    Objective:   Weight Range: 183-177 pounds.   Vital Signs:   Temp:  [98 F (36.7 C)-98.4 F (36.9 C)] 98 F (36.7 C) (06/25 0311) Pulse Rate:  [91] 91 (06/25 0311) Resp:  [16-18] 16 (06/25 0311) BP: (106-108)/(70-71) 106/71 (06/25 0311) SpO2:  [100 %] 100 % (06/25 0311) Weight:  [177 lb 9.6 oz (80.6 kg)] 177 lb 9.6 oz (80.6 kg) (06/25 0500) Last BM Date: 05/27/17  Weight change: Filed Weights   05/26/17 0600 05/27/17 0500 05/28/17 0500  Weight: 179 lb 8 oz (81.4 kg) 174 lb 9.6 oz (79.2 kg) 177 lb 9.6 oz (80.6 kg)    Intake/Output:   Intake/Output Summary (Last 24 hours) at 05/28/17 0848 Last data filed at 05/28/17 0300  Gross per 24 hour  Intake              240 ml  Output             1250 ml  Net            -1010 ml     Physical Exam: General: Well appearing female, NAD. Sitting in recliner. HEENT: Normal Neck: Supple. JVP 5-6. Carotids 2+ bilat; no bruits. No thyromegaly or nodule noted. Cor: PMI nondisplaced. RRR, No M/G/R noted.  Lungs: CTAB, normal effort. Abdomen: Soft, non-tender, non-distended, no HSM. No bruits or masses. +BS  Extremities: No cyanosis, clubbing, rash, R and LLE no edema. TED hose in place.  Neuro: Alert & orientedx3, cranial nerves grossly intact. moves all 4 extremities w/o difficulty. Affect pleasant   Telemetry: NSR, sinus tach, occasional  PVC's.  Labs: Basic Metabolic Panel:  Recent Labs Lab 05/23/17 1737 05/24/17 0321 05/25/17 2130 05/26/17 0146 05/27/17 0520 05/28/17 0256  NA 141 142 140 139 139 137  K 3.4* 3.5 3.8 4.6 5.2* 4.7  CL 104 104 102 102 102 102  CO2 24 26 26 27 25 24   GLUCOSE 72 82 164* 175* 136* 86  BUN 21* 21* 19 27* 29* 35*  CREATININE 1.25* 1.23* 1.22* 1.41* 1.39* 1.42*  CALCIUM 9.4 9.0 9.0 9.0 9.6 9.5  MG 1.7  --  2.4  --   --   --     Liver Function Tests:  Recent Labs Lab 05/23/17 1737  AST 19  ALT 23  ALKPHOS 111  BILITOT 0.6  PROT 7.9  ALBUMIN 3.7   No results for input(s): LIPASE, AMYLASE in the last 168 hours. No results for input(s): AMMONIA in the last 168 hours.  CBC:  Recent Labs Lab 05/23/17 1737  WBC 10.3  NEUTROABS 7.3  HGB 12.6  HCT 40.1  MCV 74.8*  PLT 233    Cardiac Enzymes: No results for input(s): CKTOTAL, CKMB, CKMBINDEX, TROPONINI in the last 168 hours.  BNP: BNP (last 3 results)  Recent Labs  05/23/17 1737  BNP 850.4*    ProBNP (last  3 results) No results for input(s): PROBNP in the last 8760 hours.    Other results:  Imaging: Dg Chest 2 View  Result Date: 05/28/2017 CLINICAL DATA:  Pleural effusion and post cardiac surgery. EXAM: CHEST  2 VIEW COMPARISON:  05/26/2017 FINDINGS: Postsurgical changes compatible with a mitral valve replacement and CABG procedure. There continues to be evidence for bilateral pleural effusions, left side is larger than right. Left pleural effusion is slightly smaller than it was on the comparison examination. Mild fullness of the vascular structures without frank pulmonary edema. Heart size remains enlarged. Negative for pneumothorax. IMPRESSION: Persistent pleural effusions, left side greater than right. However, the left pleural effusion appears to be slightly smaller than the recent comparison examination. Postsurgical changes. Electronically Signed   By: Richarda Overlie M.D.   On: 05/28/2017 08:01      Medications:     Scheduled Medications: . amiodarone  200 mg Oral BID  . atorvastatin  20 mg Oral q1800  . carvedilol  3.125 mg Oral BID WC  . furosemide  40 mg Oral Daily  . glipiZIDE  10 mg Oral Q breakfast  . insulin aspart  0-15 Units Subcutaneous TID WC  . magnesium oxide  400 mg Oral BID  . metFORMIN  1,000 mg Oral BID WC  . sodium chloride flush  3 mL Intravenous Q12H  . spironolactone  25 mg Oral Daily  . warfarin  1 mg Oral q1800  . Warfarin - Physician Dosing Inpatient   Does not apply q1800    Infusions: . sodium chloride      PRN Medications: sodium chloride, acetaminophen, guaiFENesin-dextromethorphan, lidocaine, ondansetron (ZOFRAN) IV, sodium chloride flush   Assessment/Plan/Discussion:   Rachael Davis is a 61 year old with recent CABG x4 and MVR in May 2018,  admitted 05/23/17 with volume overload and Afib. EKG with frequent PVC's.   1. 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  - NYHA II-II, volume status stable. Continue lasix 40mg  po daily.  - Continue Coreg and Spiro 25 mg daily.  - Losartan not started due to hyperkalemia, may be able to add outpatient, she was on K supplementation as well.  2. CAD S/P CABG x4: - No signs or symptoms of ischemia - Continue lipitor 20mg  daily  - No ASA with coumadin.   3. MVR: Bioprosthetic.  - Continue coumadin.  - INR 2.67.  4. PVCs:  - Continue Amiodarone 200mg  BID.  - PVC's now improved.  5. DM: -  Given CHF stopped linagliptin.   - No change to current plan.  6. Bilateral Pleural Effusions: s/p left thoracentesis.  - Stable on chest x ray this am.  7. PAF:  - NSR today.  - This patients CHA2DS2-VASc Score and unadjusted Ischemic Stroke Rate (% per year) is equal to 7.2 % stroke rate/year from a score of 5 Above score calculated as 1 point each if present [CHF, HTN, DM, Vascular=MI/PAD/Aortic Plaque, Age if 65-74, or lFemae], 2 points each if present [Age > 75, or  Stroke/TIA/TE] - Continue coumadin.   Follow up made in CHF clinic.    Length of Stay: 5  Little Ishikawa  05/28/2017, 8:48 AM  Advanced Heart Failure Team Pager [phone] (M-F; 7a - 4p)  Please contact [facility] for night-coverage after hours (4p -7a ) and weekends on [email or web address]  Patient seen and examined with Suzzette Righter, NP. We discussed all aspects of the encounter. I agree with the assessment and plan as stated  above.   She is much improved. Volume status looks good. PVCs suppressed on amio. INR therapeutic.   Ok to send home today on lasix 40 bid and amio 200 bid. Will follow closely in clinic. D/w TCTS service.   Arvilla Meres, MD  5:21 PM

## 2017-05-29 ENCOUNTER — Telehealth: Payer: Self-pay | Admitting: *Deleted

## 2017-05-29 NOTE — Telephone Encounter (Signed)
Danika called from [facility] to resume Ms. Elvis Coil home health orders as before her recent rehospitalization for CHF. I also informed her that an INR is to be drawn tomorrow and reported to cardiology. She will make plans for that.

## 2017-05-30 ENCOUNTER — Ambulatory Visit (INDEPENDENT_AMBULATORY_CARE_PROVIDER_SITE_OTHER): Payer: Managed Care, Other (non HMO) | Admitting: Pharmacist

## 2017-05-30 DIAGNOSIS — Z7901 Long term (current) use of anticoagulants: Secondary | ICD-10-CM

## 2017-05-30 DIAGNOSIS — Z951 Presence of aortocoronary bypass graft: Secondary | ICD-10-CM

## 2017-05-30 LAB — PROTIME-INR: INR: 1.9 — AB (ref <=1.1)

## 2017-05-31 ENCOUNTER — Telehealth (HOSPITAL_COMMUNITY): Payer: Self-pay | Admitting: Physician Assistant

## 2017-05-31 NOTE — Telephone Encounter (Signed)
      [address]       [place] [address]             [phone]    Rachael Davis [id number]   S/P CABG x 4 and MVR on 04/12/2017 by Dr. Donata Clay. She was readmitted for CHF. Seh was discharged home on 05/28/2017.  Medications: Reviewed. She states she is taking ecasa 81 mg (not the 325 mg) daily.  Coumadin:  INR check Yes-yesterday. INR was 1.9.  Problems/Concerns: None  Assessment:  Patient is doing well.  She states her breathing is doing well. She just finished walking down the driveway. She was instructed to contact office if concerns or problems develop.  Follow up Appointment: Dolores Patty, MD Follow up on 06/05/2017.   Specialty:  Cardiology Why:  10:00 am Heart Failure Clinic located on the 1st floor of the hospital. [facility].  Garage Code [id number] Contact information: [address] [place] [address] [phone]        Donata Clay, Theron Arista, MD Follow up on 06/13/2017.   Specialty:  Cardiothoracic Surgery Why:  PA/LAT CXR to be taken (at [facility] which is in the same building as Dr. Zenaida Niece Trigt's office) on 06/13/2017 at 11:30 am;Appointment time is at 12:00 pm Contact information: [address] [place] [address] [phone]

## 2017-06-01 ENCOUNTER — Telehealth (HOSPITAL_COMMUNITY): Payer: Self-pay | Admitting: Cardiology

## 2017-06-01 NOTE — Telephone Encounter (Signed)
CHF Clinic appointment reminder call placed to patient for upcoming post-hospital follow up.  Does understand purpose of this appointment and where CHF Clinic is located? PATIENT GIVEN DETAILED INSTRUCTIONS FOR PARKING AND ADVISED MOST OF THE TIME WHEELCHAIRS ARE AVAILABLE IN THE PARKING GARAGE.  How is patient feeling? REPORTS SHE IS DOING FAIRLY WELL  Does patient have all of their medications since their recent discharge? YES  Patient also reminded to take all medications as prescribed on the day of his/her appointment and to bring all medications to this appointment.  Advised to call our office for tardiness or cancellations/rescheduling needs.  CHANTEL JEFFRIES, CMA

## 2017-06-05 ENCOUNTER — Ambulatory Visit (INDEPENDENT_AMBULATORY_CARE_PROVIDER_SITE_OTHER): Payer: Managed Care, Other (non HMO) | Admitting: Pharmacist

## 2017-06-05 ENCOUNTER — Ambulatory Visit (HOSPITAL_COMMUNITY)
Admission: RE | Admit: 2017-06-05 | Discharge: 2017-06-05 | Disposition: A | Discharge status: Home / Self Care | Payer: Managed Care, Other (non HMO) | Source: Ambulatory Visit | Attending: Internal Medicine | Admitting: Internal Medicine

## 2017-06-05 ENCOUNTER — Encounter (HOSPITAL_COMMUNITY): Payer: Self-pay | Admitting: *Deleted

## 2017-06-05 VITALS — BP 94/58 | HR 86 | Wt 181.2 lb

## 2017-06-05 DIAGNOSIS — I480 Paroxysmal atrial fibrillation: Secondary | ICD-10-CM | POA: Diagnosis not present

## 2017-06-05 DIAGNOSIS — E119 Type 2 diabetes mellitus without complications: Secondary | ICD-10-CM | POA: Diagnosis not present

## 2017-06-05 DIAGNOSIS — Z79899 Other long term (current) drug therapy: Secondary | ICD-10-CM | POA: Insufficient documentation

## 2017-06-05 DIAGNOSIS — S81801A Unspecified open wound, right lower leg, initial encounter: Secondary | ICD-10-CM

## 2017-06-05 DIAGNOSIS — I5023 Acute on chronic systolic (congestive) heart failure: Secondary | ICD-10-CM | POA: Insufficient documentation

## 2017-06-05 DIAGNOSIS — I493 Ventricular premature depolarization: Secondary | ICD-10-CM

## 2017-06-05 DIAGNOSIS — Z7901 Long term (current) use of anticoagulants: Secondary | ICD-10-CM | POA: Diagnosis not present

## 2017-06-05 DIAGNOSIS — I255 Ischemic cardiomyopathy: Secondary | ICD-10-CM | POA: Diagnosis not present

## 2017-06-05 DIAGNOSIS — Z7984 Long term (current) use of oral hypoglycemic drugs: Secondary | ICD-10-CM | POA: Diagnosis not present

## 2017-06-05 DIAGNOSIS — Z951 Presence of aortocoronary bypass graft: Secondary | ICD-10-CM | POA: Diagnosis not present

## 2017-06-05 DIAGNOSIS — Z7982 Long term (current) use of aspirin: Secondary | ICD-10-CM | POA: Diagnosis not present

## 2017-06-05 DIAGNOSIS — I5042 Chronic combined systolic (congestive) and diastolic (congestive) heart failure: Secondary | ICD-10-CM

## 2017-06-05 DIAGNOSIS — I110 Hypertensive heart disease with heart failure: Secondary | ICD-10-CM | POA: Diagnosis not present

## 2017-06-05 DIAGNOSIS — I2510 Atherosclerotic heart disease of native coronary artery without angina pectoris: Secondary | ICD-10-CM | POA: Insufficient documentation

## 2017-06-05 DIAGNOSIS — Z9889 Other specified postprocedural states: Secondary | ICD-10-CM | POA: Diagnosis not present

## 2017-06-05 LAB — BASIC METABOLIC PANEL
Anion gap: 11 (ref 5–15)
BUN: 25 mg/dL — ABNORMAL HIGH (ref 6–20)
CO2: 23 mmol/L (ref 22–32)
Calcium: 9.3 mg/dL (ref 8.9–10.3)
Chloride: 101 mmol/L (ref 101–111)
Creatinine, Ser: 1.38 mg/dL — ABNORMAL HIGH (ref 0.44–1.00)
GFR calc Af Amer: 46 mL/min — ABNORMAL LOW (ref >60)
GFR calc non Af Amer: 40 mL/min — ABNORMAL LOW (ref >60)
Glucose, Bld: 131 mg/dL — ABNORMAL HIGH (ref 65–99)
Potassium: 4.1 mmol/L (ref 3.5–5.1)
Sodium: 135 mmol/L (ref 135–145)

## 2017-06-05 LAB — POCT INR: INR: 3.7

## 2017-06-05 NOTE — Patient Instructions (Signed)
Labs today (will call for abnormal results, otherwise no news is good news)  I have contacted [facility] and made them aware that you will need Daily dressing changes to your Right Lower Extremity wound. They will be in contact with you.  Follow up in 6 weeks.

## 2017-06-05 NOTE — Progress Notes (Signed)
Advanced Heart Failure Medication Review by a Pharmacist  Does the patient  feel that his/her medications are working for him/her?  yes  Has the patient been experiencing any side effects to the medications prescribed?  no  Does the patient measure his/her own blood pressure or blood glucose at home?  yes   Does the patient have any problems obtaining medications due to transportation or finances?   no  Understanding of regimen: fair Understanding of indications: fair Potential of compliance: good Patient understands to avoid NSAIDs. Patient understands to avoid decongestants.  Issues to address at subsequent visits: None   Pharmacist comments: Ms. Pruitte is a pleasant 61 yo F presenting with a recent hospital discharge medication list. She reports great compliance with her regimen and her only concern is that she doesn't feel like she is urinating as much as she used to with her furosemide. She did not have any other medication-related questions or concerns for me at this time.   Tyler Deis. Bonnye Fava, PharmD, BCPS, CPP Clinical Pharmacist Pager: [phone] Phone: [phone] 06/05/2017 10:05 AM      Time with patient: 10 minutes Preparation and documentation time: 2 minutes Total time: 12 minutes

## 2017-06-05 NOTE — Progress Notes (Signed)
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 Diuresed with IV lasix and transitioned to lasix 40 mg twice a day. Also had lots of PVCs so started on amiodarone. Discharge weight was 177 pounds.   Today she returns for post hospital follow up. Overall feeling better. Denies SOB/PND/Orthopnea. No CP. Denies fever or chills. Able to walk slowly around the grocery store. Appetite fair. Says RLE wound opened. Minimal drainage. . Taking all medications.    ECHO 05/11/2017 - Left ventricle: The cavity size was moderately dilated. Wall thickness was increased in a pattern of mild LVH. Systolic function was moderately reduced. The estimated ejection fraction was in the range of 35% to 40%. Diffuse hypokinesis. The study is not technically sufficient to allow evaluation of LV diastolic function. - Mitral valve: Normal appearing bioprosthetic MV no perivalvular regurgitation. - Left atrium: The atrium was mildly dilated. - Atrial septum: No defect or patent foramen ovale was identified. - Pulmonary arteries: PA peak pressure: 35 mm Hg (S).  LHC 04/04/2017   Prox LAD lesion, 75 %stenosed.  Prox LAD to Mid LAD lesion, 65 %stenosed.  Mid LAD to Dist LAD lesion, 55 %stenosed.  Ramus lesion, 90 %stenosed.  Lat Ramus lesion, 50 %stenosed.  Prox Cx lesion, 100 %stenosed.  Prox RCA to Mid RCA lesion, 95 %stenosed.  There is moderate left ventricular systolic dysfunction.  LV end diastolic pressure is mildly elevated.  The left ventricular ejection fraction is 35-45% by visual estimate.  There is mild (2+) mitral regurgitation. 1. Severe 3 vessel obstructive CAD 2. Moderate LV dysfunction 3. Mildly elevated LV EDP ROS: All systems negative except as listed in HPI, PMH and Problem List.  SH:  Social History    Social History  . Marital status: Married    Spouse name: N/A  . Number of children: N/A  . Years of education: N/A   Occupational History  . Not on file.   Social History Main Topics  . Smoking status: Never Smoker  . Smokeless tobacco: Never Used  . Alcohol use No  . Drug use: No  . Sexual activity: Not on file   Other Topics Concern  . Not on file   Social History Narrative  . No narrative on file    FH:  Family History  Problem Relation Age of Onset  . Heart attack Father 65    Past Medical History:  Diagnosis Date  . CHF (congestive heart failure) (HCC)   . Diabetes (HCC)   . Hypertension   . New onset atrial fibrillation (HCC) 05/2017    Current Outpatient Prescriptions  Medication Sig Dispense Refill  . acetaminophen (TYLENOL) 325 MG tablet Take 2 tablets (650 mg total) by mouth every 6 (six) hours as needed for mild pain or headache.    Marland Kitchen amiodarone (PACERONE) 200 MG tablet Take Amiodarone 200 mg by mouth two times daily for 5 days;then take 200 mg by mouth daily thereafter (Patient taking differently: No sig reported) 60 tablet 1  . aspirin EC 81 MG tablet Take 81 mg by mouth daily.    Marland Kitchen atorvastatin (LIPITOR) 20 MG tablet Take 1 tablet (20 mg total) by mouth daily at 6 PM. 30 tablet 3  . carvedilol (COREG) 3.125 MG tablet Take 1 tablet (3.125 mg total) by mouth 2 (two) times  daily with a meal. 60 tablet 1  . furosemide (LASIX) 40 MG tablet Take 1 tablet (40 mg total) by mouth 2 (two) times daily. 60 tablet 1  . glipiZIDE (GLUCOTROL XL) 10 MG 24 hr tablet Take 1 tablet (10 mg total) by mouth daily with breakfast.    . magnesium oxide (MAG-OX) 400 (241.3 Mg) MG tablet Take 1 tablet (400 mg total) by mouth daily. 30 tablet 0  . metFORMIN (GLUCOPHAGE) 1000 MG tablet Take 1,000 mg by mouth 2 (two) times daily with a meal.    . saxagliptin HCl (ONGLYZA) 5 MG TABS tablet Take 5 mg by mouth daily.    Marland Kitchen spironolactone (ALDACTONE) 25 MG tablet Take 1 tablet (25 mg  total) by mouth daily. 30 tablet 1  . warfarin (COUMADIN) 2 MG tablet Take 1 tablet (2 mg total) by mouth daily. Or as directed. 30 tablet 1  . dapagliflozin propanediol (FARXIGA) 10 MG TABS tablet Take 10 mg by mouth daily.    . traMADol (ULTRAM) 50 MG tablet Take 1 tablet (50 mg total) by mouth every 6 (six) hours as needed for severe pain. (Patient not taking: Reported on 06/05/2017) 14 tablet 0   No current facility-administered medications for this encounter.     Vitals:   06/05/17 0956  BP: (!) 94/58  Pulse: 86  SpO2: 99%  Weight: 181 lb 3.2 oz (82.2 kg)     PHYSICAL EXAM: General:  Well appearing. No resp difficulty HEENT: normal Neck: supple. JVP flat. Carotids 2+ bilaterally; no bruits. No lymphadenopathy or thryomegaly appreciated. Cor: PMI normal. Regular rate & rhythm. No rubs, gallops or murmurs. Lungs: clear Abdomen: soft, nontender, nondistended. No hepatosplenomegaly. No bruits or masses. Good bowel sounds. Extremities: no cyanosis, clubbing, rash, edema. RLE full thickness wounds x2 from CABG.  Neuro: alert & orientedx3, cranial nerves grossly intact. Moves all 4 extremities w/o difficulty. Affect pleasant.   ECG: Sinus Rhythm  1st degree heart block  PR 252 ms.    ASSESSMENT & PLAN:  1. Chronic Chronic A/C Systolic Heart Failure: Ischemic cardiomyopathy, ECHO 05/24/2017 EF 35-40%.  NYHA II. Volume status stable. Continue lasix 40 mg twice a day.  Continue coreg 3.125 mg twice a day.  No room to increase with soft BP.  BP to soft for losartan.  Continue 25 mg spiro daily.  - BMEt today.  2. CAD S/P CABG x4: - No signs or symptoms of ischemia - Continue stain and BB.  - No ASA with coumadin.   3. MVR: Bioprosthetic.  - Continue coumadin.  4. PVCs:  - Continue amio 200 mg daily. May be able to stop down the road.   5. PAF- maintaining NSR.   - This patients CHA2DS2-VASc Score and unadjusted Ischemic Stroke Rate (% per year) is equal to 7.2 % stroke  rate/year from a score of 5 Above score calculated as 1 point each if present [CHF, HTN, DM, Vascular=MI/PAD/Aortic Plaque, Age if 65-74, or lFemae], 2 points each if present [Age > 75, or Stroke/TIA/TE] - Continue coumadin.  6. RLE Full Thickness Wounds x2- surgical site. Dr Forestine Chute assess and debrided with scissor and forceps during OV. Plan to continue W-D dressing daily by [facility]. She has follow up with Dr Maren Beach next week. No antibiotics for now as it does not appear infected.   BMET today is stable.  Follow up in 6weeks. May be able to add losartan down the road but would need to watch as potassium tends to run high.  Cardiac Rehab when OK with Dr Maren Beach.     Auguste Tebbetts NP-C  11:33 AM

## 2017-06-05 NOTE — Progress Notes (Signed)
Received medical records request from [employer].  Claim# [id number].  Requested records faxed today to [phone].  Original request will be scanned to patient's electronic medical record.

## 2017-06-11 ENCOUNTER — Ambulatory Visit (INDEPENDENT_AMBULATORY_CARE_PROVIDER_SITE_OTHER): Payer: Managed Care, Other (non HMO) | Admitting: Pharmacist

## 2017-06-11 DIAGNOSIS — Z951 Presence of aortocoronary bypass graft: Secondary | ICD-10-CM

## 2017-06-11 DIAGNOSIS — Z7901 Long term (current) use of anticoagulants: Secondary | ICD-10-CM

## 2017-06-11 LAB — PROTIME-INR: INR: 4.2 — AB (ref <=1.1)

## 2017-06-13 ENCOUNTER — Ambulatory Visit: Payer: Managed Care, Other (non HMO) | Admitting: Cardiothoracic Surgery

## 2017-06-19 ENCOUNTER — Ambulatory Visit (INDEPENDENT_AMBULATORY_CARE_PROVIDER_SITE_OTHER): Payer: Managed Care, Other (non HMO) | Admitting: Pharmacist

## 2017-06-19 DIAGNOSIS — Z951 Presence of aortocoronary bypass graft: Secondary | ICD-10-CM

## 2017-06-19 DIAGNOSIS — Z7901 Long term (current) use of anticoagulants: Secondary | ICD-10-CM

## 2017-06-19 LAB — PROTIME-INR: INR: 2.7 — AB (ref <=1.1)

## 2017-06-22 ENCOUNTER — Other Ambulatory Visit: Payer: Self-pay | Admitting: Cardiothoracic Surgery

## 2017-06-22 ENCOUNTER — Ambulatory Visit (INDEPENDENT_AMBULATORY_CARE_PROVIDER_SITE_OTHER): Payer: Managed Care, Other (non HMO) | Admitting: Cardiovascular Disease

## 2017-06-22 ENCOUNTER — Encounter: Payer: Self-pay | Admitting: Cardiovascular Disease

## 2017-06-22 VITALS — BP 107/67 | HR 77 | Ht 62.0 in | Wt 175.2 lb

## 2017-06-22 DIAGNOSIS — Z951 Presence of aortocoronary bypass graft: Secondary | ICD-10-CM | POA: Diagnosis not present

## 2017-06-22 DIAGNOSIS — S81801A Unspecified open wound, right lower leg, initial encounter: Secondary | ICD-10-CM

## 2017-06-22 DIAGNOSIS — I480 Paroxysmal atrial fibrillation: Secondary | ICD-10-CM

## 2017-06-22 DIAGNOSIS — I5042 Chronic combined systolic (congestive) and diastolic (congestive) heart failure: Secondary | ICD-10-CM

## 2017-06-22 DIAGNOSIS — Z952 Presence of prosthetic heart valve: Secondary | ICD-10-CM | POA: Diagnosis not present

## 2017-06-22 DIAGNOSIS — I493 Ventricular premature depolarization: Secondary | ICD-10-CM

## 2017-06-22 MED ORDER — CEPHALEXIN 500 MG PO CAPS: 500.0000 mg | ORAL_CAPSULE | Freq: Two times a day (BID) | ORAL | 0 refills | Status: DC

## 2017-06-22 MED ORDER — METOPROLOL SUCCINATE ER 25 MG PO TB24: 25.0000 mg | ORAL_TABLET | Freq: Every day | ORAL | 3 refills | Status: DC

## 2017-06-22 NOTE — Progress Notes (Signed)
Cardiology Office Note   Date:  06/24/2017   ID:  [name], [name] [birth year], MRN [id number]  PCP:  Deatra James, MD  Cardiologist:   Chilton Si, MD   Chief Complaint  Patient presents with  . Follow-up    6 weeks;      History of Present Illness: Rachael Davis is a 61 y.o. female with CAD status post STEMI, CABG, hypertension, hyperlipidemia, diabetes, and paroxysmal atrial fibrillation who presents for follow-up.  Rachael Davis presented 04/2017 with STEMI.  Left heart catheterization revealed 100% left circumflex, 95% proximal RCA, 90% ramus intermedius, and 65% LAD.   LVEF was 35-45%.   Her echo 04/05/17 revealed LVEF 45% with lateral hypokinesis and mild MR..  She underwent four-vessel CABG (LIMA-->LAD, SVG-->RI, SVG-->OM, SVG-->RCA) on 04/16/17. In the OR she was also noted to have moderate to severe mitral regurgitation so she also underwent replacement of the mitral valve with a 25 mm [employer] bioprosthetic valve.  During the hospitalization she required diuresis for acute systolic and diastolic heart failure.  She also had a brief episode of atrial fibrillation that resolved with metoprolol.  She saw [name] in clinic 05/08/17 and was volume overloaded.  Lasix and metoprolol were increased.  She followed up with Dr. Donata Clay 05/23/17 and was found to be in atrial fibrillation.  She had a repeat echocardiogram 05/24/17 that revealed LVEF 35-40% with akinesis of the inferolateral myocardium and basal inferior myocardium. Her bioprosthetic mitral valve was functioning well. There was a small mobile target onset prosthesis that was thought to be residual native valve.  She was admitted and the heart failure team was consulted.  She spontaneously converted to sinus tachycardia. Metoprolol was discontinued 2/2 low output and she was started on amiodarone.  She underwent L thoracentesis.  Her discharge weight was 177 pounds. She was started on carvedilol 3.125mg  bid.  [name] followed up with Dr. Shirlee Latch on 7/3 and was doing well.  She feels that she is doing better than before. Her complaint is that she has some chest numbness on the left side. She also gets spasms under her left breast that radiates into her left arm pit. She denies any exertional chest pain of breath. She also has not noted orthopnea or PND. She continues to have palpitations all the time. She can feel them going into her throat. There isassociated dizziness or shortness of breath. She also notes that the skin on her right leg is darker than the left. When she saw Dr. Gala Romney, Dr. Donata Clay came to clinic and debrided some of her SVG harvesting sites.  She didn't continues to have these packed. She has noted some mild draining from the most inferior region. She continues to have a non-productive cough despite stopping lisinopril.   Past Medical History:  Diagnosis Date  . CHF (congestive heart failure) (HCC)   . Diabetes (HCC)   . Hypertension   . New onset atrial fibrillation (HCC) 05/2017    Past Surgical History:  Procedure Laterality Date  . CORONARY ARTERY BYPASS GRAFT N/A 04/12/2017   Procedure: CORONARY ARTERY BYPASS GRAFTING (CABG), ON PUMP, TIMES FOUR, USING LEFT INTERNAL MAMMARY ARTERY AND ENDOSCOPICALLY HARVESTED RIGHT GREATER SAPHENOUS VEIN;  Surgeon: Kerin Perna, MD;  Location: [facility] OR;  Service: Open Heart Surgery;  Laterality: N/A;  LIMA to LAD; SVG to OM; SVG to Ramus; SVG to Right  . IR THORACENTESIS ASP PLEURAL SPACE W/IMG GUIDE  05/25/2017  .  LEFT HEART CATH AND CORONARY ANGIOGRAPHY N/A 04/04/2017   Procedure: Left Heart Cath and Coronary Angiography;  Surgeon: Peter M [name], MD;  Location: [facility] INVASIVE CV LAB;  Service: Cardiovascular;  Laterality: N/A;  . MITRAL VALVE REPAIR N/A 04/12/2017   Procedure: MITRAL VALVE REPLACEMENT (MVR);  Surgeon: Donata Clay, Theron Arista, MD;  Location: [facility] OR;  Service: Open Heart Surgery;  Laterality: N/A;  Using a 25mm [employer] Ease Mitral Pericardial  Bioprosthesis  . TEE WITHOUT CARDIOVERSION N/A 04/12/2017   Procedure: TRANSESOPHAGEAL ECHOCARDIOGRAM (TEE);  Surgeon: Donata Clay, Theron Arista, MD;  Location: [facility] OR;  Service: Open Heart Surgery;  Laterality: N/A;  . TUBAL LIGATION       Current Outpatient Prescriptions  Medication Sig Dispense Refill  . acetaminophen (TYLENOL) 325 MG tablet Take 2 tablets (650 mg total) by mouth every 6 (six) hours as needed for mild pain or headache.    Marland Kitchen amiodarone (PACERONE) 200 MG tablet Take Amiodarone 200 mg by mouth two times daily for 5 days;then take 200 mg by mouth daily thereafter (Patient taking differently: No sig reported) 60 tablet 1  . aspirin EC 81 MG tablet Take 81 mg by mouth daily.    Marland Kitchen atorvastatin (LIPITOR) 20 MG tablet Take 1 tablet (20 mg total) by mouth daily at 6 PM. 30 tablet 3  . dapagliflozin propanediol (FARXIGA) 10 MG TABS tablet Take 10 mg by mouth daily.    . furosemide (LASIX) 40 MG tablet Take 1 tablet (40 mg total) by mouth 2 (two) times daily. 60 tablet 1  . glipiZIDE (GLUCOTROL XL) 10 MG 24 hr tablet Take 1 tablet (10 mg total) by mouth daily with breakfast.    . magnesium oxide (MAG-OX) 400 (241.3 Mg) MG tablet Take 1 tablet (400 mg total) by mouth daily. 30 tablet 0  . metFORMIN (GLUCOPHAGE) 1000 MG tablet Take 1,000 mg by mouth 2 (two) times daily with a meal.    . saxagliptin HCl (ONGLYZA) 5 MG TABS tablet Take 5 mg by mouth daily.    Marland Kitchen spironolactone (ALDACTONE) 25 MG tablet Take 1 tablet (25 mg total) by mouth daily. 30 tablet 1  . warfarin (COUMADIN) 2 MG tablet Take 1 tablet (2 mg total) by mouth daily. Or as directed. 30 tablet 1  . cephALEXin (KEFLEX) 500 MG capsule Take 1 capsule (500 mg total) by mouth 2 (two) times daily. 14 capsule 0  . metoprolol succinate (TOPROL XL) 25 MG 24 hr tablet Take 1 tablet (25 mg total) by mouth daily. 90 tablet 3   No current facility-administered medications for this visit.     Allergies:   Lisinopril    Social History:  The  patient  reports that she has never smoked. She has never used smokeless tobacco. She reports that she does not drink alcohol or use drugs.   Family History:  The patient's family history includes Heart attack (age of onset: 66) in her father.    ROS:  Please see the history of present illness.   Otherwise, review of systems are positive for none.   All other systems are reviewed and negative.    PHYSICAL EXAM: VS:  BP 107/67   Pulse 77   Ht 5\' 2"  (1.575 m)   Wt 79.5 kg (175 lb 3.2 oz)   BMI 32.04 kg/m  , BMI Body mass index is 32.04 kg/m. GENERAL:  Well appearing.  No acute distress HEENT:  Pupils equal round and reactive, fundi not visualized, oral mucosa unremarkable NECK:  No jugular venous distention, waveform within normal limits, carotid upstroke brisk and symmetric, no bruits, no thyromegaly LUNGS:  Diminished in L lung to mid lung field.   HEART: Regular with frequent ectopy.  PMI not displaced or sustained,S1 and S2 within normal limits, no S3, no S4, no clicks, no rubs, no murmurs ABD:  Flat, positive bowel sounds normal in frequency in pitch, no bruits, no rebound, no guarding, no midline pulsatile mass, no hepatomegaly, no splenomegaly EXT:  2 plus pulses throughout, no edema, no cyanosis no clubbing SKIN:  No rashes no nodules.  Well-healed midline sternal incision NEURO:  Cranial nerves II through XII grossly intact, motor grossly intact throughout PSYCH:  Cognitively intact, oriented to person place and time   EKG:  EKG is ordered today. The ekg ordered today demonstrates sinus rhythm. Rate 91 bpm. Frequent PVCs. Prior inferior infarct. Prior lateral infarct.  R wave progression.  Echo 05/24/17: Study Conclusions  - Left ventricle: The cavity size was normal. There was mild   concentric hypertrophy. Systolic function was moderately reduced.   The estimated ejection fraction was in the range of 35% to 40%.   There is akinesis of the basal-midlateral and  inferolateral   myocardium. There is akinesis of the basalinferior myocardium.   Due to tachycardia, there was fusion of early and atrial   contributions to ventricular filling. - Aortic valve: Trileaflet; normal thickness, mildly calcified   leaflets. Valve area (Vmax): 1.53 cm^2. - Mitral valve: There is a small mobile target off of the mitral   valve prosthesis that likely represents residual native mitral   valve apparatus. A bioprosthesis was present and functioning   normally. There was trivial regurgitation. - Pulmonary arteries: PA peak pressure: 33 mm Hg (S). - Pericardium, extracardiac: There was a large left pleural   effusion.   Echo 04/05/17: Study Conclusions  - Left ventricle: LVEF is approximately 45% with hypokinesis of the   lateral wall The cavity size was normal. Wall thickness was   normal. Doppler parameters are consistent with abnormal left   ventricular relaxation (grade 1 diastolic dysfunction). - Mitral valve: There was mild regurgitation. - Pulmonary arteries: PA peak pressure: 36 mm Hg (S).  LHC 04/04/17:  Prox LAD lesion, 75 %stenosed.  Prox LAD to Mid LAD lesion, 65 %stenosed.  Mid LAD to Dist LAD lesion, 55 %stenosed.  Ramus lesion, 90 %stenosed.  Lat Ramus lesion, 50 %stenosed.  Prox Cx lesion, 100 %stenosed.  Prox RCA to Mid RCA lesion, 95 %stenosed.  There is moderate left ventricular systolic dysfunction.  LV end diastolic pressure is mildly elevated.  The left ventricular ejection fraction is 35-45% by visual estimate.  There is mild (2+) mitral regurgitation.   1. Severe 3 vessel obstructive CAD 2. Moderate LV dysfunction 3. Mildly elevated LV EDP  Recent Labs: 05/23/2017: ALT 23; B Natriuretic Peptide 850.4; Hemoglobin 12.6; Platelets 233; TSH 0.490 05/25/2017: Magnesium 2.4 06/05/2017: BUN 25; Creatinine, Ser 1.38; Potassium 4.1; Sodium 135    Lipid Panel    Component Value Date/Time   CHOL 168 04/06/2017 0243   TRIG 67  04/06/2017 0243   HDL 48 04/06/2017 0243   CHOLHDL 3.5 04/06/2017 0243   VLDL 13 04/06/2017 0243   LDLCALC 107 (H) 04/06/2017 0243      Wt Readings from Last 3 Encounters:  06/22/17 79.5 kg (175 lb 3.2 oz)  06/05/17 82.2 kg (181 lb 3.2 oz)  05/28/17 80.6 kg (177 lb 9.6 oz)  ASSESSMENT AND PLAN:  # Chronic systolic and diastolic heart failure:  LVEF 60-45%.  Rachael Davis is doing well and is euvolemic.  She has been following up in the heart failure clinic as well.  She will start an ARB as BP tolerates.  Stop carvedilol and start metoprolol 2/2 frequent symptomatic PVCs.  Continue spironolactone and furosemide.  # Paroxysmal atrial fibrillaton:  No recent episodes.  Start metoprolol.  Continue warfarin.  # PVCs: Rachael Davis has frequent, symptomatic PVCs. Continue amiodarone 200 mg daily. We will stop carvedilol and start metoprolol succinate 25 mg daily.  # CAD s/p CABG:  No chest pain.  She will likely start cardiac rehab soon.  COntinue aspirin, beta blocker, and statin.  # Cellulitis: Mild drainage from harvest site.  Keflex 500mg  bid x7 days.    Current medicines are reviewed at length with the patient today.  The patient does not have concerns regarding medicines.  The following changes have been made:  Stop carvedilol.  Start metprolol and Keflex.   Labs/ tests ordered today include:   Orders Placed This Encounter  Procedures  . EKG 12-Lead     Disposition:   FU with Khyre Germond C. Duke Salvia, MD, [facility] in 6 months.     This note was written with the assistance of speech recognition software.  Please excuse any transcriptional errors.  Signed, Azaleah Usman C. Duke Salvia, MD, [facility]  06/24/2017 8:23 PM    [facility]

## 2017-06-22 NOTE — Patient Instructions (Signed)
Medication Instructions:  START KEFLEX (CEPHALEXIN) 500 MG TWICE A DAY FOR 7 DAYS  STOP CARVEDILOL   START METOPROLOL SUCC 25 MG DAILY   Labwork: NONE  Testing/Procedures: NONE  Follow-Up: Your physician wants you to follow-up in: 6 MONTH OV You will receive a reminder letter in the mail two months in advance. If you don't receive a letter, please call our office to schedule the follow-up appointment.  If you need a refill on your cardiac medications before your next appointment, please call your pharmacy.

## 2017-06-24 ENCOUNTER — Other Ambulatory Visit: Payer: Self-pay | Admitting: Cardiovascular Disease

## 2017-06-25 ENCOUNTER — Ambulatory Visit
Admission: RE | Admit: 2017-06-25 | Discharge: 2017-06-25 | Disposition: A | Discharge status: Home / Self Care | Payer: Managed Care, Other (non HMO) | Source: Ambulatory Visit | Attending: Cardiothoracic Surgery | Admitting: Cardiothoracic Surgery

## 2017-06-25 ENCOUNTER — Ambulatory Visit (INDEPENDENT_AMBULATORY_CARE_PROVIDER_SITE_OTHER): Payer: Managed Care, Other (non HMO) | Admitting: Physician Assistant

## 2017-06-25 VITALS — BP 120/84 | HR 95 | Resp 16 | Ht 62.0 in | Wt 173.0 lb

## 2017-06-25 DIAGNOSIS — Z951 Presence of aortocoronary bypass graft: Secondary | ICD-10-CM

## 2017-06-25 DIAGNOSIS — Z952 Presence of prosthetic heart valve: Secondary | ICD-10-CM

## 2017-06-25 DIAGNOSIS — Z48812 Encounter for surgical aftercare following surgery on the circulatory system: Secondary | ICD-10-CM | POA: Diagnosis not present

## 2017-06-25 NOTE — Patient Instructions (Signed)
If your shortness of breath gets worse and/or is associated with increased weight gain, please contact office for sooner appointment  Endocarditis is a potentially serious infection of heart valves or inside lining of the heart.  It occurs more commonly in patients with diseased heart valves (such as patient's with aortic or mitral valve disease) and in patients who have undergone heart valve repair or replacement.  Certain surgical and dental procedures may put you at risk, such as dental cleaning, other dental procedures, or any surgery involving the respiratory, urinary, gastrointestinal tract, gallbladder or prostate gland.   To minimize your chances for develooping endocarditis, maintain good oral health and seek prompt medical attention for any infections involving the mouth, teeth, gums, skin or urinary tract.    Always notify your doctor or dentist about your underlying heart valve condition before having any invasive procedures. You will need to take antibiotics before certain procedures, including all routine dental cleanings or other dental procedures.  Your cardiologist or dentist should prescribe these antibiotics for you to be taken ahead of time.   You may return to driving an automobile as long as you are no longer requiring oral narcotic pain relievers during the daytime.  It would be wise to start driving only short distances during the daylight and gradually increase from there as you feel comfortable.

## 2017-06-25 NOTE — Telephone Encounter (Signed)
Refill Request.  

## 2017-06-25 NOTE — Progress Notes (Signed)
HPI: Patient returns for follow up.  She is S/P CABG x 4/MVR performed 04/12/2017.  She required readmission at her post operative follow up for CHF and pleural effusions.  Since her second hospital discharge the patient states that she is doing okay for the most part.  She continues to have left sided chest discomfort stating that is feels strange and is very sensitive.  She gets short of breath at times with ambulation, but denies dyspnea while laying flat.  She states that was given keflex by the AHF clinic for the stab incision on her RLE.  She states it was red and they felt it was infected.  Her other wounds from her EVH remain open and she has been packing them as instructed.  She denies fever and purulent drainage from those sites.  Current Outpatient Prescriptions  Medication Sig Dispense Refill  . acetaminophen (TYLENOL) 325 MG tablet Take 2 tablets (650 mg total) by mouth every 6 (six) hours as needed for mild pain or headache.    Marland Kitchen amiodarone (PACERONE) 200 MG tablet Take Amiodarone 200 mg by mouth two times daily for 5 days;then take 200 mg by mouth daily thereafter (Patient taking differently: No sig reported) 60 tablet 1  . aspirin EC 81 MG tablet Take 81 mg by mouth daily.    Marland Kitchen atorvastatin (LIPITOR) 20 MG tablet Take 1 tablet (20 mg total) by mouth daily at 6 PM. 30 tablet 3  . cephALEXin (KEFLEX) 500 MG capsule Take 1 capsule (500 mg total) by mouth 2 (two) times daily. 14 capsule 0  . dapagliflozin propanediol (FARXIGA) 10 MG TABS tablet Take 10 mg by mouth daily.    . furosemide (LASIX) 40 MG tablet Take 1 tablet (40 mg total) by mouth 2 (two) times daily. 60 tablet 1  . glipiZIDE (GLUCOTROL XL) 10 MG 24 hr tablet Take 1 tablet (10 mg total) by mouth daily with breakfast.    . magnesium oxide (MAG-OX) 400 (241.3 Mg) MG tablet Take 1 tablet (400 mg total) by mouth daily. 30 tablet 0  . metFORMIN (GLUCOPHAGE) 1000 MG tablet Take 1,000 mg by mouth 2 (two) times daily with a meal.    .  metoprolol succinate (TOPROL XL) 25 MG 24 hr tablet Take 1 tablet (25 mg total) by mouth daily. 90 tablet 3  . saxagliptin HCl (ONGLYZA) 5 MG TABS tablet Take 5 mg by mouth daily.    Marland Kitchen spironolactone (ALDACTONE) 25 MG tablet Take 1 tablet (25 mg total) by mouth daily. 30 tablet 1  . warfarin (COUMADIN) 2 MG tablet Take 1 tablet (2 mg total) by mouth daily. Or as directed. 30 tablet 1   No current facility-administered medications for this visit.     Physical Exam:  BP 120/84 (BP Location: Right Arm, Patient Position: Sitting, Cuff Size: Large)   Pulse 95   Resp 16   Ht 5\' 2"  (1.575 m)   Wt 173 lb (78.5 kg)   SpO2 98% Comment: ON RA  BMI 31.64 kg/m   Gen; no apparent distress Heart: RRR, mild tachycardia Lungs: diminished left base Ext: 2 open EVH sites, + yellow granulation tissues, no tracking present... Stab site is scabbed over with no erythema or drainage present Sternotomy: well healed, mild sternal instability  Diagnostic Tests:  CXR: continued resolution of right pleural effusion, moderate left pleural effusion  A/P:  1. Acute on Chronic Systolic HF- being closely managed by AHF group 2. Left Pleural effusion- looks slightly improved from film  on 05/28/2017. Patient would like to observe for another 2 weeks to see if continues to improve instead of having Thoracentesis... This is reasonable as she is fairly asymptomatic 3. EVH- cellulitis is resolving, continue diligent wound care to [facility] sites... Granulation tissue debrided from [facility] wounds today.. Patient instructed to finish Keflex even though no active signs of infection 4. 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 RTC in 2 weeks with repeat CXR and wound check with PVT  Lowella Dandy, PA-C [facility] [phone]

## 2017-06-26 ENCOUNTER — Ambulatory Visit (INDEPENDENT_AMBULATORY_CARE_PROVIDER_SITE_OTHER): Payer: Managed Care, Other (non HMO) | Admitting: Pharmacist Clinician (PhC)/ Clinical Pharmacy Specialist

## 2017-06-26 DIAGNOSIS — Z7901 Long term (current) use of anticoagulants: Secondary | ICD-10-CM

## 2017-06-26 DIAGNOSIS — Z951 Presence of aortocoronary bypass graft: Secondary | ICD-10-CM

## 2017-06-26 LAB — POCT INR: INR: 2.1

## 2017-06-28 ENCOUNTER — Ambulatory Visit: Payer: Managed Care, Other (non HMO) | Admitting: Cardiothoracic Surgery

## 2017-06-29 ENCOUNTER — Emergency Department (HOSPITAL_COMMUNITY)
Admission: EM | Admit: 2017-06-29 | Discharge: 2017-06-30 | Disposition: A | Discharge status: Home / Self Care | Payer: Managed Care, Other (non HMO) | Attending: Emergency Medicine | Admitting: Emergency Medicine

## 2017-06-29 ENCOUNTER — Emergency Department (HOSPITAL_COMMUNITY): Payer: Managed Care, Other (non HMO)

## 2017-06-29 ENCOUNTER — Encounter (HOSPITAL_COMMUNITY): Payer: Self-pay | Admitting: Emergency Medicine

## 2017-06-29 DIAGNOSIS — I110 Hypertensive heart disease with heart failure: Secondary | ICD-10-CM | POA: Diagnosis not present

## 2017-06-29 DIAGNOSIS — R0602 Shortness of breath: Secondary | ICD-10-CM | POA: Insufficient documentation

## 2017-06-29 DIAGNOSIS — R05 Cough: Secondary | ICD-10-CM | POA: Diagnosis present

## 2017-06-29 DIAGNOSIS — Z951 Presence of aortocoronary bypass graft: Secondary | ICD-10-CM | POA: Diagnosis not present

## 2017-06-29 DIAGNOSIS — I5043 Acute on chronic combined systolic (congestive) and diastolic (congestive) heart failure: Secondary | ICD-10-CM | POA: Insufficient documentation

## 2017-06-29 DIAGNOSIS — E119 Type 2 diabetes mellitus without complications: Secondary | ICD-10-CM | POA: Insufficient documentation

## 2017-06-29 DIAGNOSIS — Z7901 Long term (current) use of anticoagulants: Secondary | ICD-10-CM | POA: Insufficient documentation

## 2017-06-29 LAB — CBC
HCT: 37 % (ref 36.0–46.0)
Hemoglobin: 11.3 g/dL — ABNORMAL LOW (ref 12.0–15.0)
MCH: 22.2 pg — ABNORMAL LOW (ref 26.0–34.0)
MCHC: 30.5 g/dL (ref 30.0–36.0)
MCV: 72.8 fL — ABNORMAL LOW (ref 78.0–100.0)
Platelets: 158 10*3/uL (ref 150–400)
RBC: 5.08 MIL/uL (ref 3.87–5.11)
RDW: 18.5 % — ABNORMAL HIGH (ref 11.5–15.5)
WBC: 8.8 10*3/uL (ref 4.0–10.5)

## 2017-06-29 LAB — BASIC METABOLIC PANEL
Anion gap: 15 (ref 5–15)
BUN: 19 mg/dL (ref 6–20)
CO2: 21 mmol/L — ABNORMAL LOW (ref 22–32)
Calcium: 9 mg/dL (ref 8.9–10.3)
Chloride: 101 mmol/L (ref 101–111)
Creatinine, Ser: 1.4 mg/dL — ABNORMAL HIGH (ref 0.44–1.00)
GFR calc Af Amer: 46 mL/min — ABNORMAL LOW (ref >60)
GFR calc non Af Amer: 39 mL/min — ABNORMAL LOW (ref >60)
Glucose, Bld: 177 mg/dL — ABNORMAL HIGH (ref 65–99)
Potassium: 4.2 mmol/L (ref 3.5–5.1)
Sodium: 137 mmol/L (ref 135–145)

## 2017-06-29 LAB — I-STAT TROPONIN, ED: Troponin i, poc: 0.04 ng/mL (ref 0.00–0.08)

## 2017-06-29 NOTE — ED Triage Notes (Addendum)
Per EMS pt with history of CHF. Shortness of breath this evening.  Had a coughing "spell" en route followed by a period of anxiety. Pt states she has no history of anxiety and after talking with EMS this resolved. Status post MI in May per pt  VS 136/104, 100% RA, CBG 222, HR 85

## 2017-06-29 NOTE — ED Provider Notes (Signed)
Emergency Department Provider Note   I have reviewed the triage vital signs and the nursing notes.   HISTORY  Chief Complaint Shortness of Breath   HPI Rachael Davis is a 61 y.o. female with PMH of CHF, CAD s/p CABG DM, HTN, and a-fib presents to the emergency department for evaluation of persistent cough over the last 2 months with some worsening SOB this evening. She was watching TV today when she suddenly had a "coughing fit" and afterwards had significant shortness of breath. She noted some pressure in the back of her neck but no chest pain or jaw discomfort. She notes intermittent shortness of breath and coughing fits over the past 2 months. She has had some fluid drained from the right lung after her CABG but coughing did not improve significantly. She does note some worsening shortness of breath today compared to other days but husband states that she was out of the house for the first time and more physically active today. During the coughing fit and resulting shortness of breath and EMS was called. She reports that they were able to help her slow her breathing and she is currently feeling better. No fever or chills.    Past Medical History:  Diagnosis Date  . CHF (congestive heart failure) (HCC)   . Diabetes (HCC)   . Hypertension   . New onset atrial fibrillation (HCC) 05/2017    Patient Active Problem List   Diagnosis Date Noted  . Systolic and diastolic CHF, acute on chronic (HCC) 05/23/2017  . Volume overload 05/08/2017  . Ellicia Alix term (current) use of anticoagulants [Z79.01] 04/23/2017  . 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  . Essential hypertension 04/04/2017  . 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    Past Surgical History:  Procedure Laterality Date  . CORONARY ARTERY BYPASS GRAFT N/A 04/12/2017   Procedure: CORONARY ARTERY BYPASS GRAFTING (CABG), ON PUMP, TIMES FOUR, USING LEFT INTERNAL  MAMMARY ARTERY AND ENDOSCOPICALLY HARVESTED RIGHT GREATER SAPHENOUS VEIN;  Surgeon: Kerin Perna, MD;  Location: [facility] OR;  Service: Open Heart Surgery;  Laterality: N/A;  LIMA to LAD; SVG to OM; SVG to Ramus; SVG to Right  . IR THORACENTESIS ASP PLEURAL SPACE W/IMG GUIDE  05/25/2017  . LEFT HEART CATH AND CORONARY ANGIOGRAPHY N/A 04/04/2017   Procedure: Left Heart Cath and Coronary Angiography;  Surgeon: Peter M [name], MD;  Location: [facility] INVASIVE CV LAB;  Service: Cardiovascular;  Laterality: N/A;  . MITRAL VALVE REPAIR N/A 04/12/2017   Procedure: MITRAL VALVE REPLACEMENT (MVR);  Surgeon: Donata Clay, Theron Arista, MD;  Location: [facility] OR;  Service: Open Heart Surgery;  Laterality: N/A;  Using a 59mm Magna Ease Mitral Pericardial Bioprosthesis  . TEE WITHOUT CARDIOVERSION N/A 04/12/2017   Procedure: TRANSESOPHAGEAL ECHOCARDIOGRAM (TEE);  Surgeon: Donata Clay, Theron Arista, MD;  Location: [facility] OR;  Service: Open Heart Surgery;  Laterality: N/A;  . TUBAL LIGATION      Current Outpatient Rx  . Order #: [id number] Class: Print  . Order #: [id number] Class: Historical Med  . Order #: [id number] Class: Print  . Order #: [id number] Class: Normal  . Order #: [id number] Class: Historical Med  . Order #: [id number] Class: Print  . Order #: [id number] Class: No Print  . Order #: [id number] Class: Print  . Order #: [id number] Class: Historical Med  . Order #: [id number] Class: Normal  . Order #: [id number] Class: Historical Med  . Order #: [id number] Class: Print  . Order #: [id number] Class: Normal  . Order #:  [id number] Class: No Print  . Order #: [id number] Class: Print    Allergies Lisinopril  Family History  Problem Relation Age of Onset  . Heart attack Father 31    Social History Social History  Substance Use Topics  . Smoking status: Never Smoker  . Smokeless tobacco: Never Used  . Alcohol use No    Review of Systems  Constitutional: No fever/chills Eyes: No visual changes. ENT: No sore throat. Cardiovascular: Denies chest  pain. Respiratory: Positive shortness of breath and coughing.  Gastrointestinal: No abdominal pain.  No nausea, no vomiting.  No diarrhea.  No constipation. Genitourinary: Negative for dysuria. Musculoskeletal: Negative for back pain. Skin: Negative for rash. Neurological: Negative for headaches, focal weakness or numbness.  10-point ROS otherwise negative.  ____________________________________________   PHYSICAL EXAM:  VITAL SIGNS: ED Triage Vitals [06/29/17 2133]  Enc Vitals Group     BP 123/70     Pulse Rate 84     Resp 18     Temp 98.4 F (36.9 C)     Temp Source Oral     SpO2 100 %    Constitutional: Alert and oriented. Well appearing and in no acute distress. Eyes: Conjunctivae are normal.  Head: Atraumatic. Nose: No congestion/rhinnorhea. Mouth/Throat: Mucous membranes are moist.   Cardiovascular: Normal rate, regular rhythm. Good peripheral circulation. Grossly normal heart sounds.   Respiratory: Normal respiratory effort.  No retractions. Lungs CTAB. Gastrointestinal: Soft and nontender. No distention.  Musculoskeletal: No lower extremity tenderness with trace LE edema. No gross deformities of extremities. Neurologic:  Normal speech and language. No gross focal neurologic deficits are appreciated.  Skin:  Skin is warm, dry and intact. No rash noted. Well-appearing dressings on the right leg s/p vein harvesting.  Psychiatric: Mood and affect are normal. Speech and behavior are normal.  ____________________________________________   LABS (all labs ordered are listed, but only abnormal results are displayed)  Labs Reviewed  BASIC METABOLIC PANEL - Abnormal; Notable for the following:       Result Value   CO2 21 (*)    Glucose, Bld 177 (*)    Creatinine, Ser 1.40 (*)    GFR calc non Af Amer 39 (*)    GFR calc Af Amer 46 (*)    All other components within normal limits  CBC - Abnormal; Notable for the following:    Hemoglobin 11.3 (*)    MCV 72.8 (*)     MCH 22.2 (*)    RDW 18.5 (*)    All other components within normal limits  TROPONIN I  I-STAT TROPONIN, ED   ____________________________________________  EKG   EKG Interpretation  Date/Time:  Friday June 29 2017 21:40:12 EDT Ventricular Rate:  84 PR Interval:  290 QRS Duration: 94 QT Interval:  402 QTC Calculation: 475 R Axis:   -15 Text Interpretation:  Sinus rhythm with 1st degree A-V block with frequent Premature ventricular complexes Inferior infarct , age undetermined Possible Anterior infarct , age undetermined Abnormal ECG No STEMI. Similar to prior.  Confirmed by Alona Bene [phone]) on 06/29/2017 10:07:22 PM       ____________________________________________  RADIOLOGY  Dg Chest 2 View  Result Date: 06/29/2017 CLINICAL DATA:  Shortness of Breath EXAM: CHEST  2 VIEW COMPARISON:  06/25/2017 FINDINGS: Cardiac shadow is enlarged. Stable left basilar changes are again seen. Small right-sided pleural effusion noted. Surgical changes are seen. No other focal abnormality is noted. IMPRESSION: Stable changes in the bases bilaterally left greater than right Electronically Signed  By: Alcide Clever M.D.   On: 06/29/2017 22:16    ____________________________________________   PROCEDURES  Procedure(s) performed:   Procedures  None ____________________________________________   INITIAL IMPRESSION / ASSESSMENT AND PLAN / ED COURSE  Pertinent labs & imaging results that were available during my care of the patient were reviewed by me and considered in my medical decision making (see chart for details).  Patient presents to the emergency department for evaluation of shortness of breath following a coughing episode. She does have history of recent CABG and pleural effusion requiring thoracentesis. No acute distress at this time. Plan for CXR, labs, and reassessment. Very low suspicion for anginal equivalent in this case with onset of symptoms with coughing.  Labs  unremarkable. Plan for repeat troponin and discharge if negative. Care transferred to Dr. Blinda Leatherwood who will follow repeat troponin.  ____________________________________________  FINAL CLINICAL IMPRESSION(S) / ED DIAGNOSES  Final diagnoses:  Shortness of breath     MEDICATIONS GIVEN DURING THIS VISIT:  Medications - No data to display   NEW OUTPATIENT MEDICATIONS STARTED DURING THIS VISIT:  None   Note:  This document was prepared using Dragon voice recognition software and may include unintentional dictation errors.  Alona Bene, MD Emergency Medicine   Sheryle Vice, Arlyss Repress, MD 06/30/17 Rich Fuchs

## 2017-06-30 LAB — TROPONIN I
Troponin I: 0.03 ng/mL (ref <0.03)
Troponin I: 0.03 ng/mL (ref <0.03)

## 2017-06-30 NOTE — ED Provider Notes (Signed)
Patient signed out to me by Dr. Jacqulyn Bath to follow-up second troponin. Patient had been seen with shortness of breath. She has been experiencing shortness of breath and cough over the last 2 months. Tonight her breathing worsened somewhat. She is not expressing any chest pain. She has, however, recently underwent bypass surgery. Her initial cardiac evaluation was unremarkable. She was awaiting a second troponin at time of signout. Her second troponin was borderline at 0.03. This was discussed with Dr. Santiago Glad, on-call for cardiology. He felt that the symptoms were not consistent with acute coronary syndrome. Recommended repeating a troponin. If it was unchanged, patient did not require further evaluation or workup. If her troponin was performed and it was once again 0.03, consistent with previous value. Based on cardiology recommendation, patient will be discharged and is to follow-up as an outpatient.  Results for orders placed or performed during the hospital encounter of 06/29/17  Basic metabolic panel  Result Value Ref Range   Sodium 137 135 - 145 mmol/L   Potassium 4.2 3.5 - 5.1 mmol/L   Chloride 101 101 - 111 mmol/L   CO2 21 (L) 22 - 32 mmol/L   Glucose, Bld 177 (H) 65 - 99 mg/dL   BUN 19 6 - 20 mg/dL   Creatinine, Ser 2.50 (H) 0.44 - 1.00 mg/dL   Calcium 9.0 8.9 - 53.9 mg/dL   GFR calc non Af Amer 39 (L) >60 mL/min   GFR calc Af Amer 46 (L) >60 mL/min   Anion gap 15 5 - 15  CBC  Result Value Ref Range   WBC 8.8 4.0 - 10.5 K/uL   RBC 5.08 3.87 - 5.11 MIL/uL   Hemoglobin 11.3 (L) 12.0 - 15.0 g/dL   HCT 76.7 34.1 - 93.7 %   MCV 72.8 (L) 78.0 - 100.0 fL   MCH 22.2 (L) 26.0 - 34.0 pg   MCHC 30.5 30.0 - 36.0 g/dL   RDW 90.2 (H) 40.9 - 73.5 %   Platelets 158 150 - 400 K/uL  Troponin I  Result Value Ref Range   Troponin I 0.03 (HH) <0.03 ng/mL  Troponin I  Result Value Ref Range   Troponin I 0.03 (HH) <0.03 ng/mL  I-stat troponin, ED  Result Value Ref Range   Troponin i, poc 0.04 0.00  - 0.08 ng/mL   Comment 3           Dg Chest 2 View  Result Date: 06/29/2017 CLINICAL DATA:  Shortness of Breath EXAM: CHEST  2 VIEW COMPARISON:  06/25/2017 FINDINGS: Cardiac shadow is enlarged. Stable left basilar changes are again seen. Small right-sided pleural effusion noted. Surgical changes are seen. No other focal abnormality is noted. IMPRESSION: Stable changes in the bases bilaterally left greater than right Electronically Signed   By: Alcide Clever M.D.   On: 06/29/2017 22:16   Dg Chest 2 View  Result Date: 06/25/2017 CLINICAL DATA:  Follow-up CABG. EXAM: CHEST  2 VIEW COMPARISON:  05/28/2017 .  05/25/2017.  04/08/2015. FINDINGS: Prior CABG and cardiac valve replacement. Cardiomegaly with normal pulmonary vascularity. Left base atelectasis and infiltrate with small left pleural effusion. Small right pleural effusion. No pneumothorax. IMPRESSION: 1. Left base atelectasis and infiltrate with left-sided pleural effusion. Small right pleural effusion. 2. Prior CABG and cardiac valve replacement.  Cardiomegaly. Electronically Signed   By: Maisie Fus  Register   On: 06/25/2017 13:49      Damarrion Mimbs, Canary Brim, MD 06/30/17 [phone]

## 2017-07-03 ENCOUNTER — Ambulatory Visit (INDEPENDENT_AMBULATORY_CARE_PROVIDER_SITE_OTHER): Payer: Managed Care, Other (non HMO) | Admitting: Pharmacist

## 2017-07-03 DIAGNOSIS — I480 Paroxysmal atrial fibrillation: Secondary | ICD-10-CM | POA: Insufficient documentation

## 2017-07-03 DIAGNOSIS — Z7901 Long term (current) use of anticoagulants: Secondary | ICD-10-CM

## 2017-07-03 DIAGNOSIS — I4891 Unspecified atrial fibrillation: Secondary | ICD-10-CM

## 2017-07-03 DIAGNOSIS — Z951 Presence of aortocoronary bypass graft: Secondary | ICD-10-CM

## 2017-07-03 LAB — PROTIME-INR: INR: 3 — AB (ref <=1.1)

## 2017-07-09 ENCOUNTER — Telehealth: Payer: Self-pay | Admitting: Cardiovascular Disease

## 2017-07-09 ENCOUNTER — Other Ambulatory Visit: Payer: Self-pay | Admitting: Cardiothoracic Surgery

## 2017-07-09 DIAGNOSIS — Z951 Presence of aortocoronary bypass graft: Secondary | ICD-10-CM

## 2017-07-09 NOTE — Telephone Encounter (Signed)
New message       Pt c/o medication issue:  1. Name of Medication:  cephalexin  2. How are you currently taking this medication (dosage and times per day)? 500mg  3. Are you having a reaction (difficulty breathing--STAT)? no  4. What is your medication issue?  Pt want to know if this medication will cause the skin on her feet and legs to peel?  She stopped taking the medication last week because she thinks she is having a reaction.  Please advise

## 2017-07-09 NOTE — Telephone Encounter (Signed)
Spoke with patient and she started the Cephalexin on 06/23/17 and only took for a couple of days before she stopped secondary to he feet peeling on the top. Has continued to get worse and now both feet are peeling. Advised patient Cephalexin should be out of system so should not be causing the problem. Advised to contact PCP. Leg is no worse and HHN coming out to see her. Patient was instructed at visit with PA 06/25/17 to complete Cephalexin but she did not. Will forward to Dr Duke Salvia for review.

## 2017-07-09 NOTE — Telephone Encounter (Signed)
Would not take the Keflex if she thinks it caused a drug reaction.  Agree with PCP follow up.

## 2017-07-10 ENCOUNTER — Encounter: Payer: Self-pay | Admitting: Cardiothoracic Surgery

## 2017-07-10 ENCOUNTER — Ambulatory Visit
Admission: RE | Admit: 2017-07-10 | Discharge: 2017-07-10 | Disposition: A | Discharge status: Home / Self Care | Payer: Managed Care, Other (non HMO) | Source: Ambulatory Visit | Attending: Cardiothoracic Surgery | Admitting: Cardiothoracic Surgery

## 2017-07-10 ENCOUNTER — Ambulatory Visit (INDEPENDENT_AMBULATORY_CARE_PROVIDER_SITE_OTHER): Payer: Managed Care, Other (non HMO) | Admitting: Pharmacist Clinician (PhC)/ Clinical Pharmacy Specialist

## 2017-07-10 ENCOUNTER — Other Ambulatory Visit: Payer: Self-pay | Admitting: Cardiothoracic Surgery

## 2017-07-10 ENCOUNTER — Ambulatory Visit (INDEPENDENT_AMBULATORY_CARE_PROVIDER_SITE_OTHER): Payer: Self-pay | Admitting: Cardiothoracic Surgery

## 2017-07-10 VITALS — BP 123/87 | HR 91 | Resp 16 | Ht 62.0 in | Wt 176.0 lb

## 2017-07-10 DIAGNOSIS — Z952 Presence of prosthetic heart valve: Secondary | ICD-10-CM

## 2017-07-10 DIAGNOSIS — I4891 Unspecified atrial fibrillation: Secondary | ICD-10-CM

## 2017-07-10 DIAGNOSIS — Z951 Presence of aortocoronary bypass graft: Secondary | ICD-10-CM

## 2017-07-10 DIAGNOSIS — Z7901 Long term (current) use of anticoagulants: Secondary | ICD-10-CM

## 2017-07-10 LAB — POCT INR: INR: 1.9

## 2017-07-10 NOTE — Telephone Encounter (Signed)
Advised patient, verbalized understanding  

## 2017-07-10 NOTE — Progress Notes (Signed)
PCP is Deatra James, MD Referring Provider is [name], Zailey Audia M, MD  Chief Complaint  Patient presents with  . Routine Post Op    2 wk f/u with a CXR and WOUND CK    HPI: Follow-up after combined CABG and mitral valve replacement 2-1/2 months ago. She has maintained sinus rhythm with Coumadin. She had a left pleural effusion which is now resolved and her chest x-ray today is clear. INR today is 1.9 which is within range.  She has slow to heal right lower leg incisions at the saphenous vein harvest site which I personally cleaned with peroxide and saline and placed Neosporin dressings on. A home health nurses visiting her twice a week for local therapy with debriding ointment.  ] She is driving and doing housework but has not yet started cardiac rehabilitation. We'll make referral for her to start cardiac rehabilitation at [place].  She denies ankle swelling or orthopnea Past Medical History:  Diagnosis Date  . CHF (congestive heart failure) (HCC)   . Diabetes (HCC)   . Hypertension   . New onset atrial fibrillation (HCC) 05/2017    Past Surgical History:  Procedure Laterality Date  . CORONARY ARTERY BYPASS GRAFT N/A 04/12/2017   Procedure: CORONARY ARTERY BYPASS GRAFTING (CABG), ON PUMP, TIMES FOUR, USING LEFT INTERNAL MAMMARY ARTERY AND ENDOSCOPICALLY HARVESTED RIGHT GREATER SAPHENOUS VEIN;  Surgeon: Kerin Perna, MD;  Location: [facility] OR;  Service: Open Heart Surgery;  Laterality: N/A;  LIMA to LAD; SVG to OM; SVG to Ramus; SVG to Right  . IR THORACENTESIS ASP PLEURAL SPACE W/IMG GUIDE  05/25/2017  . LEFT HEART CATH AND CORONARY ANGIOGRAPHY N/A 04/04/2017   Procedure: Left Heart Cath and Coronary Angiography;  Surgeon: Council Munguia M [name], MD;  Location: [facility] INVASIVE CV LAB;  Service: Cardiovascular;  Laterality: N/A;  . MITRAL VALVE REPAIR N/A 04/12/2017   Procedure: MITRAL VALVE REPLACEMENT (MVR);  Surgeon: Donata Clay, Theron Arista, MD;  Location: [facility] OR;  Service: Open Heart Surgery;   Laterality: N/A;  Using a 25mm Magna Ease Mitral Pericardial Bioprosthesis  . TEE WITHOUT CARDIOVERSION N/A 04/12/2017   Procedure: TRANSESOPHAGEAL ECHOCARDIOGRAM (TEE);  Surgeon: Donata Clay, Theron Arista, MD;  Location: [facility] OR;  Service: Open Heart Surgery;  Laterality: N/A;  . TUBAL LIGATION      Family History  Problem Relation Age of Onset  . Heart attack Father 75    Social History Social History  Substance Use Topics  . Smoking status: Never Smoker  . Smokeless tobacco: Never Used  . Alcohol use No    Current Outpatient Prescriptions  Medication Sig Dispense Refill  . acetaminophen (TYLENOL) 325 MG tablet Take 2 tablets (650 mg total) by mouth every 6 (six) hours as needed for mild pain or headache.    Marland Kitchen amiodarone (PACERONE) 200 MG tablet Take Amiodarone 200 mg by mouth two times daily for 5 days;then take 200 mg by mouth daily thereafter (Patient taking differently: No sig reported) 60 tablet 1  . aspirin EC 81 MG tablet Take 81 mg by mouth daily.    Marland Kitchen atorvastatin (LIPITOR) 20 MG tablet Take 1 tablet (20 mg total) by mouth daily at 6 PM. 30 tablet 3  . dapagliflozin propanediol (FARXIGA) 10 MG TABS tablet Take 10 mg by mouth daily.    . furosemide (LASIX) 40 MG tablet Take 1 tablet (40 mg total) by mouth 2 (two) times daily. 60 tablet 1  . glipiZIDE (GLUCOTROL XL) 10 MG 24 hr tablet Take 1 tablet (  10 mg total) by mouth daily with breakfast.    . magnesium oxide (MAG-OX) 400 (241.3 Mg) MG tablet Take 1 tablet (400 mg total) by mouth daily. 30 tablet 0  . metFORMIN (GLUCOPHAGE) 1000 MG tablet Take 1,000 mg by mouth 2 (two) times daily with a meal.    . metoprolol succinate (TOPROL XL) 25 MG 24 hr tablet Take 1 tablet (25 mg total) by mouth daily. 90 tablet 3  . saxagliptin HCl (ONGLYZA) 5 MG TABS tablet Take 5 mg by mouth daily.    Marland Kitchen spironolactone (ALDACTONE) 25 MG tablet Take 1 tablet (25 mg total) by mouth daily. 30 tablet 1  . warfarin (COUMADIN) 2.5 MG tablet Take 1 to 1 and 1/2  tablet daily or as directed by coumadin clinic. 45 tablet 1  . warfarin (COUMADIN) 2 MG tablet Take 1 tablet (2 mg total) by mouth daily. Or as directed. (Patient not taking: Reported on 06/29/2017) 30 tablet 1   No current facility-administered medications for this visit.     Allergies  Allergen Reactions  . Lisinopril Cough    Review of Systems   Improved strength and exercise tolerance Minimal sternal pain No cough  BP 123/87 (BP Location: Right Arm, Patient Position: Sitting, Cuff Size: Large)   Pulse 91   Resp 16   Ht 5\' 2"  (1.575 m)   Wt 176 lb (79.8 kg)   SpO2 98% Comment: ON RA  BMI 32.19 kg/m  Physical Exam      Exam    General- alert and comfortable   Lungs- clear without rales, wheezes   Cor- regular rate and rhythm, no murmur , gallop   Abdomen- soft, non-tender   Extremities - warm, non-tender, minimal edema   Neuro- oriented, appropriate, no focal weakness   Diagnostic Tests: Chest x-ray clear  Impression: The patient is maintaining sinus rhythm on amiodarone 200 mg a day. We will stop that now and she will return for check in 8 weeks. If she maintains sinus rhythm will probably stop the Coumadin as she has a tissue valve.  Her leg needs further home health nursing supervision and a follow-up appointment at the Monday PA/leg clinic at our  office     Plan: Return as scheduled.   Mikey Bussing, MD [facility] [phone]

## 2017-07-11 ENCOUNTER — Other Ambulatory Visit: Payer: Self-pay | Admitting: Cardiovascular Disease

## 2017-07-11 NOTE — Telephone Encounter (Signed)
Please review for refill, thanks ! 

## 2017-07-13 NOTE — Addendum Note (Signed)
Addended by: Rowland Lathe R on: 07/13/2017 10:06 AM   Modules accepted: Orders

## 2017-07-17 ENCOUNTER — Ambulatory Visit (INDEPENDENT_AMBULATORY_CARE_PROVIDER_SITE_OTHER): Payer: Managed Care, Other (non HMO) | Admitting: Pharmacist Clinician (PhC)/ Clinical Pharmacy Specialist

## 2017-07-17 ENCOUNTER — Ambulatory Visit (HOSPITAL_COMMUNITY)
Admission: RE | Admit: 2017-07-17 | Discharge: 2017-07-17 | Disposition: A | Discharge status: Home / Self Care | Payer: Managed Care, Other (non HMO) | Source: Ambulatory Visit | Attending: Cardiology | Admitting: Cardiology

## 2017-07-17 ENCOUNTER — Encounter (HOSPITAL_COMMUNITY): Payer: Self-pay

## 2017-07-17 VITALS — BP 120/77 | HR 88 | Wt 180.1 lb

## 2017-07-17 DIAGNOSIS — I493 Ventricular premature depolarization: Secondary | ICD-10-CM | POA: Diagnosis not present

## 2017-07-17 DIAGNOSIS — Z7901 Long term (current) use of anticoagulants: Secondary | ICD-10-CM

## 2017-07-17 DIAGNOSIS — Z951 Presence of aortocoronary bypass graft: Secondary | ICD-10-CM | POA: Diagnosis not present

## 2017-07-17 DIAGNOSIS — I110 Hypertensive heart disease with heart failure: Secondary | ICD-10-CM | POA: Diagnosis not present

## 2017-07-17 DIAGNOSIS — I340 Nonrheumatic mitral (valve) insufficiency: Secondary | ICD-10-CM | POA: Diagnosis not present

## 2017-07-17 DIAGNOSIS — I472 Ventricular tachycardia: Secondary | ICD-10-CM | POA: Insufficient documentation

## 2017-07-17 DIAGNOSIS — I255 Ischemic cardiomyopathy: Secondary | ICD-10-CM | POA: Diagnosis present

## 2017-07-17 DIAGNOSIS — Z7984 Long term (current) use of oral hypoglycemic drugs: Secondary | ICD-10-CM | POA: Insufficient documentation

## 2017-07-17 DIAGNOSIS — Z79899 Other long term (current) drug therapy: Secondary | ICD-10-CM | POA: Diagnosis not present

## 2017-07-17 DIAGNOSIS — Z7982 Long term (current) use of aspirin: Secondary | ICD-10-CM | POA: Insufficient documentation

## 2017-07-17 DIAGNOSIS — I4891 Unspecified atrial fibrillation: Secondary | ICD-10-CM

## 2017-07-17 DIAGNOSIS — I5043 Acute on chronic combined systolic (congestive) and diastolic (congestive) heart failure: Secondary | ICD-10-CM

## 2017-07-17 DIAGNOSIS — Z8249 Family history of ischemic heart disease and other diseases of the circulatory system: Secondary | ICD-10-CM | POA: Diagnosis not present

## 2017-07-17 DIAGNOSIS — I2510 Atherosclerotic heart disease of native coronary artery without angina pectoris: Secondary | ICD-10-CM | POA: Diagnosis not present

## 2017-07-17 DIAGNOSIS — J90 Pleural effusion, not elsewhere classified: Secondary | ICD-10-CM | POA: Diagnosis not present

## 2017-07-17 DIAGNOSIS — Z9889 Other specified postprocedural states: Secondary | ICD-10-CM | POA: Diagnosis not present

## 2017-07-17 DIAGNOSIS — E119 Type 2 diabetes mellitus without complications: Secondary | ICD-10-CM | POA: Diagnosis not present

## 2017-07-17 DIAGNOSIS — I480 Paroxysmal atrial fibrillation: Secondary | ICD-10-CM | POA: Insufficient documentation

## 2017-07-17 DIAGNOSIS — I509 Heart failure, unspecified: Secondary | ICD-10-CM | POA: Diagnosis not present

## 2017-07-17 LAB — BASIC METABOLIC PANEL
Anion gap: 9 (ref 5–15)
BUN: 20 mg/dL (ref 6–20)
CO2: 26 mmol/L (ref 22–32)
Calcium: 9.6 mg/dL (ref 8.9–10.3)
Chloride: 105 mmol/L (ref 101–111)
Creatinine, Ser: 1.52 mg/dL — ABNORMAL HIGH (ref 0.44–1.00)
GFR calc Af Amer: 41 mL/min — ABNORMAL LOW (ref >60)
GFR calc non Af Amer: 36 mL/min — ABNORMAL LOW (ref >60)
Glucose, Bld: 125 mg/dL — ABNORMAL HIGH (ref 65–99)
Potassium: 4.4 mmol/L (ref 3.5–5.1)
Sodium: 140 mmol/L (ref 135–145)

## 2017-07-17 LAB — POCT INR: INR: 1.9

## 2017-07-17 MED ORDER — LOSARTAN POTASSIUM 25 MG PO TABS: 12.5000 mg | ORAL_TABLET | Freq: Every day | ORAL | 3 refills | Status: DC

## 2017-07-17 NOTE — Progress Notes (Signed)
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 Diuresed with IV lasix and transitioned to lasix 40 mg twice a day. Also had lots of PVCs so started on amiodarone. Discharge weight was 177 pounds.   She returns today for HF follow up. No SOB with walking back to clinic, no SOB with walking around the neighborhood. She recently saw Dr. Donata Clay who told her it was ok to stop Amiodarone and also to stop Cleda Daub when her RX runs out. I reviewed his note and I do see where Dr. Donata Clay said to stop Amio, but there is no mention of the spiro. On her AVS from that office visit, it appears that Dr. Donata Clay wrote on her med list "stop spiro after Rx runs out". She has not started cardiac rehab, she has been referred, but has not gotten a call yet. Weights at home are stable. Denies chest pain, orthopnea and PND. She has occasional PVC's and can feel them, but this has improved since starting metoprolol.    ECHO 05/11/2017 - Left ventricle: The cavity size was moderately dilated. Wall thickness was increased in a pattern of mild LVH. Systolic function was moderately reduced. The estimated ejection fraction was in the range of 35% to 40%. Diffuse hypokinesis. The study is not technically sufficient to allow evaluation of LV diastolic function. - Mitral valve: Normal appearing bioprosthetic MV no perivalvular regurgitation. - Left atrium: The atrium was mildly dilated. - Atrial septum: No defect or patent foramen ovale was identified. - Pulmonary arteries: PA peak pressure: 35 mm Hg (S).  LHC 04/04/2017   Prox LAD lesion, 75 %stenosed.  Prox LAD to Mid LAD lesion, 65 %stenosed.  Mid LAD to Dist LAD lesion, 55 %stenosed.  Ramus lesion, 90 %stenosed.  Lat Ramus lesion, 50 %stenosed.  Prox Cx lesion, 100  %stenosed.  Prox RCA to Mid RCA lesion, 95 %stenosed.  There is moderate left ventricular systolic dysfunction.  LV end diastolic pressure is mildly elevated.  The left ventricular ejection fraction is 35-45% by visual estimate.  There is mild (2+) mitral regurgitation. 1. Severe 3 vessel obstructive CAD 2. Moderate LV dysfunction 3. Mildly elevated LV EDP ROS: All systems negative except as listed in HPI, PMH and Problem List.  SH:  Social History   Social History  . Marital status: Married    Spouse name: N/A  . Number of children: N/A  . Years of education: N/A   Occupational History  . Not on file.   Social History Main Topics  . Smoking status: Never Smoker  . Smokeless tobacco: Never Used  . Alcohol use No  . Drug use: No  . Sexual activity: Not on file   Other Topics Concern  . Not on file   Social History Narrative  . No narrative on file    FH:  Family History  Problem Relation Age of Onset  . Heart attack Father 71    Past Medical History:  Diagnosis Date  . CHF (congestive heart failure) (HCC)   . Diabetes (HCC)   . Hypertension   . New onset atrial fibrillation (HCC) 05/2017    Current Outpatient Prescriptions  Medication Sig Dispense Refill  . acetaminophen (TYLENOL) 325 MG tablet Take 2 tablets (650 mg total) by mouth every 6 (six)  hours as needed for mild pain or headache.    Marland Kitchen aspirin EC 81 MG tablet Take 81 mg by mouth daily.    Marland Kitchen atorvastatin (LIPITOR) 20 MG tablet Take 1 tablet (20 mg total) by mouth daily at 6 PM. 30 tablet 3  . dapagliflozin propanediol (FARXIGA) 10 MG TABS tablet Take 10 mg by mouth daily.    . furosemide (LASIX) 40 MG tablet Take 40 mg by mouth daily.    Marland Kitchen glipiZIDE (GLUCOTROL XL) 10 MG 24 hr tablet Take 1 tablet (10 mg total) by mouth daily with breakfast.    . magnesium oxide (MAG-OX) 400 (241.3 Mg) MG tablet TAKE 1 TABLET BY MOUTH EVERY DAY 30 tablet 11  . metFORMIN (GLUCOPHAGE) 1000 MG tablet Take 1,000 mg  by mouth 2 (two) times daily with a meal.    . metoprolol succinate (TOPROL XL) 25 MG 24 hr tablet Take 1 tablet (25 mg total) by mouth daily. 90 tablet 3  . saxagliptin HCl (ONGLYZA) 5 MG TABS tablet Take 5 mg by mouth daily.    Marland Kitchen spironolactone (ALDACTONE) 25 MG tablet Take 1 tablet (25 mg total) by mouth daily. 30 tablet 1  . warfarin (COUMADIN) 2.5 MG tablet Take 1 to 1 and 1/2 tablet daily or as directed by coumadin clinic. 45 tablet 1   No current facility-administered medications for this encounter.     Vitals:   07/17/17 0932  BP: 120/77  Pulse: 88  SpO2: 100%  Weight: 180 lb 2 oz (81.7 kg)     PHYSICAL EXAM: General: Well appearing. No resp difficulty. HEENT: Normal Neck: Supple. JVP 5-6. Carotids 2+ bilat; no bruits. No thyromegaly or nodule noted. Cor: PMI nondisplaced. RRR, No M/G/R noted. Sternal incision noted, intact.  Lungs: CTAB, normal effort. Abdomen: Soft, non-tender, non-distended, no HSM. No bruits or masses. +BS  Extremities: No cyanosis, clubbing, rash, R and LLE no edema.  Neuro: Alert & orientedx3, cranial nerves grossly intact. moves all 4 extremities w/o difficulty. Affect pleasant    ASSESSMENT & PLAN:  1. Chronic Chronic A/C Systolic Heart Failure: Ischemic cardiomyopathy, ECHO 05/24/2017 EF 35-40%.  - NYHA II - Volume status stable on exam. Continue lasix 40 mg BID.  - Continue metoprolol XL 25 mg daily.  - Start losartan 12.5 mg hs.  - Stop Cleda Daub per Dr. Norina Buzzard.  - BMET today and in 10 days after starting Cleda Daub.    2. CAD S/P CABG x4: - No signs or symptoms of ischemia.  - Continue Lipitor - No ASA with warfarin.   3. MVR: Bioprosthetic.  - Stable. Followed by PVT.   4. PVCs:  - Amio stopped per PVT. If she has recurring PVC's may need to restart. She has awareness of her PVC's and says they have improved since starting metoprolol (was previously on Coreg).   5. PAF - NSR on exam.  - Per PVT  Stop Amio and return to office in 8 weeks  to see if she is in NSR, if so she will be able to stop coumadin.   6. RLE Full Thickness Wounds x2- surgical site: - Followed at PA/leg clinic at surgical office.   BMET today and in 10-14 days. Follow up in 2 months.     Little Ishikawa NP-C  9:36 AM

## 2017-07-17 NOTE — Progress Notes (Signed)
Advanced Heart Failure Medication Review by a Pharmacist  Does the patient  feel that his/her medications are working for him/her?  yes  Has the patient been experiencing any side effects to the medications prescribed?  no  Does the patient measure his/her own blood pressure or blood glucose at home?  no   Does the patient have any problems obtaining medications due to transportation or finances?   no  Understanding of regimen: good Understanding of indications: good Potential of compliance: good Patient understands to avoid NSAIDs. Patient understands to avoid decongestants.  Issues to address at subsequent visits: None   Pharmacist comments: Rachael Davis is a pleasant 61 yo F presenting with her husband and a current medication list. She reports good compliance with her regimen but states that Dr. Donata Clay advised her to stop her amiodarone on 07/11/17 and to not refill her spironolactone once it is out. She did not have any other medication-related questions or concerns for me at this time.   Rachael Davis, PharmD, BCPS, CPP Clinical Pharmacist Pager: [phone] Phone: [phone] 07/17/2017 9:44 AM      Time with patient: 10 minutes Preparation and documentation time: 2 minutes Total time: 12 minutes

## 2017-07-17 NOTE — Patient Instructions (Addendum)
Start Losartan 12.5 mg (1/2 tab) daily at bedtime  Lab today  Lab in 10-14 days  You have been referred to Cardiac Rehab, they will call you to schedule  Your physician recommends that you schedule a follow-up appointment in: 2 months

## 2017-07-20 ENCOUNTER — Telehealth (HOSPITAL_COMMUNITY): Payer: Self-pay

## 2017-07-20 NOTE — Telephone Encounter (Signed)
Cardiac Rehab Medication Review by a Pharmacist  Does the patient feel that his/her medications are working for him/her?  yes  Has the patient been experiencing any side effects to the medications prescribed?  no  Does the patient measure his/her own blood pressure or blood glucose at home?  no - going to inquire about getting a blood pressure monitor for home  Does the patient have any problems obtaining medications due to transportation or finances?   no   Understanding of regimen: good Understanding of indications: good Potential of compliance: good   Pharmacist Comments: Patient states that she had tingling arm the other day, lasted five minutes and went away. No other concerns. Educated to monitor for further side effects and let us know if she has any questions/concerns.   Avir Deruiter L. Marcy Salvo, PharmD, MS PGY1 Pharmacy Resident Pager: [phone]

## 2017-07-24 ENCOUNTER — Ambulatory Visit (INDEPENDENT_AMBULATORY_CARE_PROVIDER_SITE_OTHER): Payer: Managed Care, Other (non HMO) | Admitting: Pharmacist

## 2017-07-24 DIAGNOSIS — I4891 Unspecified atrial fibrillation: Secondary | ICD-10-CM

## 2017-07-24 DIAGNOSIS — Z951 Presence of aortocoronary bypass graft: Secondary | ICD-10-CM

## 2017-07-24 DIAGNOSIS — Z7901 Long term (current) use of anticoagulants: Secondary | ICD-10-CM

## 2017-07-24 LAB — PROTIME-INR: INR: 2 — AB (ref <=1.1)

## 2017-07-25 ENCOUNTER — Encounter (HOSPITAL_COMMUNITY): Payer: Self-pay | Admitting: *Deleted

## 2017-07-25 ENCOUNTER — Telehealth (HOSPITAL_COMMUNITY): Payer: Self-pay

## 2017-07-25 NOTE — Telephone Encounter (Signed)
*  Updated insurance benefits* [employer] - no co-payment, deductible $1800/$1800 has been met, out of pocket $4300/$4300 has been met, no co-insurance and no pre-authorization. Passport/reference [phone].

## 2017-07-25 NOTE — Progress Notes (Signed)
Medical record request from [employer] disability received today.  Request records faxed to [phone].  Claim # [id number].  Original request will be scanned to patient's electronic medical record.

## 2017-07-26 ENCOUNTER — Encounter (HOSPITAL_COMMUNITY)
Admission: RE | Admit: 2017-07-26 | Discharge: 2017-07-26 | Disposition: A | Discharge status: Home / Self Care | Payer: 59 | Source: Ambulatory Visit | Attending: Internal Medicine | Admitting: Internal Medicine

## 2017-07-26 ENCOUNTER — Encounter (HOSPITAL_COMMUNITY): Payer: Self-pay

## 2017-07-26 VITALS — BP 128/80 | HR 91 | Ht 63.0 in | Wt 178.6 lb

## 2017-07-26 DIAGNOSIS — Z951 Presence of aortocoronary bypass graft: Secondary | ICD-10-CM | POA: Diagnosis present

## 2017-07-26 DIAGNOSIS — I213 ST elevation (STEMI) myocardial infarction of unspecified site: Secondary | ICD-10-CM

## 2017-07-26 HISTORY — DX: Hyperlipidemia, unspecified: E78.5

## 2017-07-26 HISTORY — DX: Atherosclerotic heart disease of native coronary artery without angina pectoris: I25.10

## 2017-07-26 HISTORY — DX: Cardiac arrhythmia, unspecified: I49.9

## 2017-07-26 NOTE — Progress Notes (Signed)
KASIDEE EGLEY 61 y.o. female DOB: January 15, 1956 MRN: [id number]      Nutrition Note  1. 04/04/17 ST elevation myocardial infarction (STEMI), unspecified artery (HCC)   2. 04/10/17 S/P CABG x 4    Past Medical History:  Diagnosis Date  . CHF (congestive heart failure) (HCC)   . Diabetes (HCC)   . Hypertension   . New onset atrial fibrillation (HCC) 05/2017   Meds reviewed. Farxiga, Glucotrol XL, Metformin, Onglyza, Coumadin noted  HT: Ht Readings from Last 1 Encounters:  07/26/17 5\' 3"  (1.6 m)    WT: Wt Readings from Last 3 Encounters:  07/26/17 178 lb 9.2 oz (81 kg)  07/17/17 180 lb 2 oz (81.7 kg)  07/10/17 176 lb (79.8 kg)     BMI 31.6   Current tobacco use? No  Labs:  Lipid Panel     Component Value Date/Time   CHOL 168 04/06/2017 0243   TRIG 67 04/06/2017 0243   HDL 48 04/06/2017 0243   CHOLHDL 3.5 04/06/2017 0243   VLDL 13 04/06/2017 0243   LDLCALC 107 (H) 04/06/2017 0243    Lab Results  Component Value Date   HGBA1C 8.2 (H) 04/06/2017   CBG (last 3)  No results for input(s): GLUCAP in the last 72 hours.  Nutrition Note Spoke with pt. Nutrition plan and goals reviewed with pt. Pt is following Step 2 of the Therapeutic Lifestyle Changes diet. Pt states she has lost 30 lb since her surgery due to decreased appetite and fear of eating. Pt reports feeling anxious since her surgery. Pt wantsto lose more wt. Wt loss tips briefly reviewed. Pt is diabetic. Last A1c indicates blood glucose poorly controlled. Pt checks CBG's every other day. Pt expressed understanding of the information reviewed. Pt aware of nutrition education classes offered and plans on attending nutrition classes.  Nutrition Diagnosis ? Food-and nutrition-related knowledge deficit related to lack of exposure to information as related to diagnosis of: ? CVD ? DM  ? Obesity related to excessive energy intake as evidenced by a BMI of 31.6  Nutrition Intervention ? Pt's individual nutrition plan and goals  reviewed with pt.  Nutrition Goal(s):  ? Pt to identify food quantities necessary to achieve weight loss of 6-24 lb (2.7-10.9 kg) at graduation from cardiac rehab.  ? Improved blood glucose control as evidenced by pt's a1c decreasing towards 7 or less.  Plan:  Pt to attend nutrition classes ? Nutrition I ? Nutrition II ? Portion Distortion ? Diabetes Blitz ? Diabetes Q & A Will provide client-centered nutrition education as part of interdisciplinary care.   Monitor and evaluate progress toward nutrition goal with team.  Mickle Plumb, M.Ed, RD, LDN, CDE 07/26/2017 3:48 PM

## 2017-07-26 NOTE — Progress Notes (Signed)
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  acetaminophen (TYLENOL) 325 MG tablet, Take 2 tablets (650 mg total) by mouth every 6 (six) hours as needed for mild pain or headache., Disp: , Rfl:  .  aspirin EC 81 MG tablet, Take 81 mg by mouth daily., Disp: , Rfl:  .  atorvastatin (LIPITOR) 20 MG tablet, Take 1 tablet (20 mg total) by mouth daily at 6 PM., Disp: 30 tablet, Rfl: 3 .  dapagliflozin propanediol (FARXIGA) 10 MG TABS tablet, Take 10 mg by mouth daily., Disp: , Rfl:  .  furosemide (LASIX) 40 MG tablet, Take 40 mg by mouth daily., Disp: , Rfl:  .  glipiZIDE (GLUCOTROL XL) 10 MG 24 hr tablet, Take 1 tablet (10 mg total) by mouth daily with breakfast., Disp: , Rfl:  .  losartan (COZAAR) 25 MG tablet, Take 0.5 tablets (12.5 mg total) by mouth at bedtime., Disp: 15 tablet, Rfl: 3 .  magnesium oxide (MAG-OX) 400 (241.3 Mg) MG tablet, TAKE 1 TABLET BY MOUTH EVERY DAY, Disp: 30 tablet, Rfl: 11 .  metFORMIN (GLUCOPHAGE) 1000 MG tablet, Take 1,000 mg by mouth 2 (two) times daily with a meal., Disp: , Rfl:  .  metoprolol succinate (TOPROL XL) 25 MG 24 hr tablet, Take 1 tablet (25 mg total) by mouth daily., Disp: 90 tablet, Rfl: 3 .  saxagliptin HCl (ONGLYZA) 5 MG TABS tablet, Take 5 mg by mouth daily., Disp: , Rfl:  .  spironolactone (ALDACTONE) 25 MG tablet, Take 1 tablet  (25 mg total) by mouth daily., Disp: 30 tablet, Rfl: 1 .  warfarin (COUMADIN) 2.5 MG tablet, Take 1 to 1 and 1/2 tablet daily or as directed by coumadin clinic., Disp: 45 tablet, Rfl: 1  Past Medical History: Past Medical History:  Diagnosis Date  . Arrhythmia    Hx PAF, PVC's  . CHF (congestive heart failure) ([place])   . Coronary artery disease    04/04/2017 STEMI  . Diabetes ([place])   . Hyperlipidemia   . Hypertension   . New onset atrial fibrillation ([place]) 05/2017    Tobacco Use: History  Smoking Status  . Never Smoker  Smokeless Tobacco  . Never Used    Labs: Recent Review Flowsheet Data    Labs for ITP Cardiac and Pulmonary Rehab Latest Ref Rng & Units 04/12/2017 04/12/2017 04/13/2017 04/14/2017 04/15/2017   Cholestrol 0 - 200 mg/dL - - - - -   LDLCALC 0 - 99 mg/dL - - - - -   HDL >40 mg/dL - - - - -   Trlycerides <150 mg/dL - - - - -   Hemoglobin A1c 4.8 - 5.6 % - - - - -   PHART 7.350 - 7.450 7.354 7.376 7.366 - -   PCO2ART 32.0 - 48.0 mmHg 37.1 34.1 36.2 - -   HCO3 20.0 - 28.0 mmol/L 20.6 20.2  20.2 - -   TCO2 0 - 100 mmol/L 22 21 21  - -   ACIDBASEDEF 0.0 - 2.0 mmol/L 4.0(H) 5.0(H) 4.2(H) - -   O2SAT % 95.0 96.0 98.8 56.8 64.6      Capillary Blood Glucose: Lab Results  Component Value Date   GLUCAP 110 (H) 05/28/2017   GLUCAP 74 05/28/2017   GLUCAP 85 05/27/2017   GLUCAP 115 (H) 05/27/2017   GLUCAP 154 (H) 05/27/2017     Exercise Target Goals: Date: 07/26/17  Exercise Program Goal: Individual exercise prescription set with THRR, safety & activity barriers. Participant demonstrates ability to understand and report RPE using BORG scale, to self-measure pulse accurately, and to acknowledge the importance of the exercise prescription.  Exercise Prescription Goal: Starting with aerobic activity 30 plus minutes a day, 3 days per week for initial exercise prescription. Provide home exercise prescription and guidelines that participant acknowledges understanding prior  to discharge.  Activity Barriers & Risk Stratification:     Activity Barriers & Cardiac Risk Stratification - 07/26/17 1350      Activity Barriers & Cardiac Risk Stratification   Activity Barriers Muscular Weakness;Deconditioning   Cardiac Risk Stratification High      6 Minute Walk:     6 Minute Walk    Row Name 07/26/17 1452         6 Minute Walk   Phase Initial     Distance 1343 feet     Walk Time 6 minutes     # of Rest Breaks 0     MPH 2.54     METS 3.14     RPE 13     VO2 Peak 10.99     Symptoms No     Resting HR 87 bpm     Resting BP 128/80     Max Ex. HR 113 bpm     Max Ex. BP 118/82     2 Minute Post BP 122/78        Oxygen Initial Assessment:   Oxygen Re-Evaluation:   Oxygen Discharge (Final Oxygen Re-Evaluation):   Initial Exercise Prescription:     Initial Exercise Prescription - 07/26/17 1400      Date of Initial Exercise RX and Referring Provider   Date 07/26/17   Referring Provider Bensimhon, Daniel MD     Recumbant Bike   Level 2   Minutes 10   METs 2     NuStep   Level 2   SPM 80   Minutes 10   METs 2     Track   Laps 8   Minutes 10   METs 2.74     Prescription Details   Frequency (times per week) 3   Duration Progress to 30 minutes of continuous aerobic without signs/symptoms of physical distress     Intensity   THRR 40-80% of Max Heartrate 63-126   Ratings of Perceived Exertion 11-13   Perceived Dyspnea 0-4     Progression   Progression Continue to progress workloads to maintain intensity without signs/symptoms of physical distress.     Resistance Training   Training Prescription Yes   Weight 2lbs   Reps 10-15      Perform Capillary Blood Glucose checks as needed.  Exercise Prescription Changes:   Exercise Comments:   Exercise Goals and Review:     Exercise Goals    Row Name 07/26/17 1351             Exercise Goals   Increase Physical  Activity Yes       Intervention Provide advice,  education, support and counseling about physical activity/exercise needs.;Develop an individualized exercise prescription for aerobic and resistive training based on initial evaluation findings, risk stratification, comorbidities and participant's personal goals.       Expected Outcomes Achievement of increased cardiorespiratory fitness and enhanced flexibility, muscular endurance and strength shown through measurements of functional capacity and personal statement of participant.       Increase Strength and Stamina Yes  return to work and being able to keep and play with grandkids       Intervention Provide advice, education, support and counseling about physical activity/exercise needs.;Develop an individualized exercise prescription for aerobic and resistive training based on initial evaluation findings, risk stratification, comorbidities and participant's personal goals.       Expected Outcomes Achievement of increased cardiorespiratory fitness and enhanced flexibility, muscular endurance and strength shown through measurements of functional capacity and personal statement of participant.          Exercise Goals Re-Evaluation :    Discharge Exercise Prescription (Final Exercise Prescription Changes):   Nutrition:  Target Goals: Understanding of nutrition guidelines, daily intake of sodium 1500mg , cholesterol 200mg , calories 30% from fat and 7% or less from saturated fats, daily to have 5 or more servings of fruits and vegetables.  Biometrics:     Pre Biometrics - 07/26/17 1634      Pre Biometrics   Height 5\' 3"  (1.6 m)   Weight 178 lb 9.2 oz (81 kg)   Hip Circumference 44 inches   BMI (Calculated) 31.64   % Body Fat 44.2 %   Grip Strength 30 kg   Flexibility 9.5 in   Single Leg Stand 11.4 seconds       Nutrition Therapy Plan and Nutrition Goals:     Nutrition Therapy & Goals - 07/26/17 1548      Nutrition Therapy   Diet Carb Modified, Therapeutic Lifestyle Changes      Personal Nutrition Goals   Nutrition Goal Pt to identify food quantities necessary to achieve weight loss of 6-24 lb (2.7-10.9 kg) at graduation from cardiac rehab.    Personal Goal #2 Improved blood glucose control as evidenced by pt's a1c decreasing towards 7 or less.      Intervention Plan   Intervention Prescribe, educate and counsel regarding individualized specific dietary modifications aiming towards targeted core components such as weight, hypertension, lipid management, diabetes, heart failure and other comorbidities.   Expected Outcomes Short Term Goal: Understand basic principles of dietary content, such as calories, fat, sodium, cholesterol and nutrients.;Long Term Goal: Adherence to prescribed nutrition plan.      Nutrition Discharge: Nutrition Scores:     Nutrition Assessments - 07/26/17 1548      MEDFICTS Scores   Pre Score 21      Nutrition Goals Re-Evaluation:   Nutrition Goals Re-Evaluation:   Nutrition Goals Discharge (Final Nutrition Goals Re-Evaluation):   Psychosocial: Target Goals: Acknowledge presence or absence of significant depression and/or stress, maximize coping skills, provide positive support system. Participant is able to verbalize types and ability to use techniques and skills needed for reducing stress and depression.  Initial Review & Psychosocial Screening:     Initial Psych Review & Screening - 07/26/17 1633      Family Dynamics   Good Support System? Yes     Barriers   Psychosocial barriers to participate in program There are no identifiable barriers or psychosocial needs.     Screening  Interventions   Interventions Encouraged to exercise      Quality of Life Scores:     Quality of Life - 07/26/17 1456      Quality of Life Scores   Health/Function Pre 22.5 %   Socioeconomic Pre 23 %   Psych/Spiritual Pre 21.93 %   Family Pre 27.6 %   GLOBAL Pre 23.24 %      PHQ-9: Recent Review Flowsheet Data    There is no  flowsheet data to display.     Interpretation of Total Score  Total Score Depression Severity:  1-4 = Minimal depression, 5-9 = Mild depression, 10-14 = Moderate depression, 15-19 = Moderately severe depression, 20-27 = Severe depression   Psychosocial Evaluation and Intervention:   Psychosocial Re-Evaluation:   Psychosocial Discharge (Final Psychosocial Re-Evaluation):   Vocational Rehabilitation: Provide vocational rehab assistance to qualifying candidates.   Vocational Rehab Evaluation & Intervention:     Vocational Rehab - 07/26/17 1632      Initial Vocational Rehab Evaluation & Intervention   Assessment shows need for Vocational Rehabilitation No      Education: Education Goals: Education classes will be provided on a weekly basis, covering required topics. Participant will state understanding/return demonstration of topics presented.  Learning Barriers/Preferences:     Learning Barriers/Preferences - 07/26/17 1452      Learning Barriers/Preferences   Learning Barriers Sight      Education Topics: Count Your Pulse:  -Group instruction provided by verbal instruction, demonstration, patient participation and written materials to support subject.  Instructors address importance of being able to find your pulse and how to count your pulse when at home without a heart monitor.  Patients get hands on experience counting their pulse with staff help and individually.   Heart Attack, Angina, and Risk Factor Modification:  -Group instruction provided by verbal instruction, video, and written materials to support subject.  Instructors address signs and symptoms of angina and heart attacks.    Also discuss risk factors for heart disease and how to make changes to improve heart health risk factors.   Functional Fitness:  -Group instruction provided by verbal instruction, demonstration, patient participation, and written materials to support subject.  Instructors address  safety measures for doing things around the house.  Discuss how to get up and down off the floor, how to pick things up properly, how to safely get out of a chair without assistance, and balance training.   Meditation and Mindfulness:  -Group instruction provided by verbal instruction, patient participation, and written materials to support subject.  Instructor addresses importance of mindfulness and meditation practice to help reduce stress and improve awareness.  Instructor also leads participants through a meditation exercise.    Stretching for Flexibility and Mobility:  -Group instruction provided by verbal instruction, patient participation, and written materials to support subject.  Instructors lead participants through series of stretches that are designed to increase flexibility thus improving mobility.  These stretches are additional exercise for major muscle groups that are typically performed during regular warm up and cool down.   Hands Only CPR:  -Group verbal, video, and participation provides a basic overview of AHA guidelines for community CPR. Role-play of emergencies allow participants the opportunity to practice calling for help and chest compression technique with discussion of AED use.   Hypertension: -Group verbal and written instruction that provides a basic overview of hypertension including the most recent diagnostic guidelines, risk factor reduction with self-care instructions and medication management.    Nutrition  I class: Heart Healthy Eating:  -Group instruction provided by PowerPoint slides, verbal discussion, and written materials to support subject matter. The instructor gives an explanation and review of the Therapeutic Lifestyle Changes diet recommendations, which includes a discussion on lipid goals, dietary fat, sodium, fiber, plant stanol/sterol esters, sugar, and the components of a well-balanced, healthy diet.   Nutrition II class: Lifestyle Skills:   -Group instruction provided by PowerPoint slides, verbal discussion, and written materials to support subject matter. The instructor gives an explanation and review of label reading, grocery shopping for heart health, heart healthy recipe modifications, and ways to make healthier choices when eating out.   Diabetes Question & Answer:  -Group instruction provided by PowerPoint slides, verbal discussion, and written materials to support subject matter. The instructor gives an explanation and review of diabetes co-morbidities, pre- and post-prandial blood glucose goals, pre-exercise blood glucose goals, signs, symptoms, and treatment of hypoglycemia and hyperglycemia, and foot care basics.   Diabetes Blitz:  -Group instruction provided by PowerPoint slides, verbal discussion, and written materials to support subject matter. The instructor gives an explanation and review of the physiology behind type 1 and type 2 diabetes, diabetes medications and rational behind using different medications, pre- and post-prandial blood glucose recommendations and Hemoglobin A1c goals, diabetes diet, and exercise including blood glucose guidelines for exercising safely.    Portion Distortion:  -Group instruction provided by PowerPoint slides, verbal discussion, written materials, and food models to support subject matter. The instructor gives an explanation of serving size versus portion size, changes in portions sizes over the last 20 years, and what consists of a serving from each food group.   Stress Management:  -Group instruction provided by verbal instruction, video, and written materials to support subject matter.  Instructors review role of stress in heart disease and how to cope with stress positively.     Exercising on Your Own:  -Group instruction provided by verbal instruction, power point, and written materials to support subject.  Instructors discuss benefits of exercise, components of exercise,  frequency and intensity of exercise, and end points for exercise.  Also discuss use of nitroglycerin and activating EMS.  Review options of places to exercise outside of rehab.  Review guidelines for sex with heart disease.   Cardiac Drugs I:  -Group instruction provided by verbal instruction and written materials to support subject.  Instructor reviews cardiac drug classes: antiplatelets, anticoagulants, beta blockers, and statins.  Instructor discusses reasons, side effects, and lifestyle considerations for each drug class.   Cardiac Drugs II:  -Group instruction provided by verbal instruction and written materials to support subject.  Instructor reviews cardiac drug classes: angiotensin converting enzyme inhibitors (ACE-I), angiotensin II receptor blockers (ARBs), nitrates, and calcium channel blockers.  Instructor discusses reasons, side effects, and lifestyle considerations for each drug class.   Anatomy and Physiology of the Circulatory System:  Group verbal and written instruction and models provide basic cardiac anatomy and physiology, with the coronary electrical and arterial systems. Review of: AMI, Angina, Valve disease, Heart Failure, Peripheral Artery Disease, Cardiac Arrhythmia, Pacemakers, and the ICD.   Other Education:  -Group or individual verbal, written, or video instructions that support the educational goals of the cardiac rehab program.   Knowledge Questionnaire Score:     Knowledge Questionnaire Score - 07/26/17 1457      Knowledge Questionnaire Score   Pre Score 21/24      Core Components/Risk Factors/Patient Goals at Admission:     Personal Goals and Risk  Factors at Admission - 07/26/17 1353      Core Components/Risk Factors/Patient Goals on Admission    Weight Management Yes;Obesity;Weight Maintenance;Weight Loss   Intervention Weight Management: Develop a combined nutrition and exercise program designed to reach desired caloric intake, while maintaining  appropriate intake of nutrient and fiber, sodium and fats, and appropriate energy expenditure required for the weight goal.;Weight Management: Provide education and appropriate resources to help participant work on and attain dietary goals.;Weight Management/Obesity: Establish reasonable short term and long term weight goals.;Obesity: Provide education and appropriate resources to help participant work on and attain dietary goals.   Expected Outcomes Short Term: Continue to assess and modify interventions until short term weight is achieved;Long Term: Adherence to nutrition and physical activity/exercise program aimed toward attainment of established weight goal;Weight Maintenance: Understanding of the daily nutrition guidelines, which includes 25-35% calories from fat, 7% or less cal from saturated fats, less than 200mg  cholesterol, less than 1.5gm of sodium, & 5 or more servings of fruits and vegetables daily;Weight Loss: Understanding of general recommendations for a balanced deficit meal plan, which promotes 1-2 lb weight loss per week and includes a negative energy balance of [phone] kcal/d;Understanding recommendations for meals to include 15-35% energy as protein, 25-35% energy from fat, 35-60% energy from carbohydrates, less than 200mg  of dietary cholesterol, 20-35 gm of total fiber daily;Understanding of distribution of calorie intake throughout the day with the consumption of 4-5 meals/snacks;Weight Gain: Understanding of general recommendations for a high calorie, high protein meal plan that promotes weight gain by distributing calorie intake throughout the day with the consumption for 4-5 meals, snacks, and/or supplements   Diabetes Yes   Intervention Provide education about signs/symptoms and action to take for hypo/hyperglycemia.;Provide education about proper nutrition, including hydration, and aerobic/resistive exercise prescription along with prescribed medications to achieve blood glucose in  normal ranges: Fasting glucose 65-99 mg/dL   Expected Outcomes Short Term: Participant verbalizes understanding of the signs/symptoms and immediate care of hyper/hypoglycemia, proper foot care and importance of medication, aerobic/resistive exercise and nutrition plan for blood glucose control.;Long Term: Attainment of HbA1C < 7%.   Hypertension Yes   Intervention Provide education on lifestyle modifcations including regular physical activity/exercise, weight management, moderate sodium restriction and increased consumption of fresh fruit, vegetables, and low fat dairy, alcohol moderation, and smoking cessation.;Monitor prescription use compliance.   Expected Outcomes Short Term: Continued assessment and intervention until BP is < 140/68mm HG in hypertensive participants. < 130/32mm HG in hypertensive participants with diabetes, heart failure or chronic kidney disease.;Long Term: Maintenance of blood pressure at goal levels.   Lipids Yes   Intervention Provide education and support for participant on nutrition & aerobic/resistive exercise along with prescribed medications to achieve LDL 70mg , HDL >40mg .   Expected Outcomes Short Term: Participant states understanding of desired cholesterol values and is compliant with medications prescribed. Participant is following exercise prescription and nutrition guidelines.;Long Term: Cholesterol controlled with medications as prescribed, with individualized exercise RX and with personalized nutrition plan. Value goals: LDL < 70mg , HDL > 40 mg.      Core Components/Risk Factors/Patient Goals Review:    Core Components/Risk Factors/Patient Goals at Discharge (Final Review):    ITP Comments:     ITP Comments    Row Name 07/26/17 1346           ITP Comments Medical Director, Dr. Armanda Magic          Comments: Felishia attended orientation from 1330 to 1545 to review rules and guidelines  for program. Completed 6 minute walk test, Intitial ITP, and  exercise prescription.  VSS. Telemetry-Sinus Rhythm with intermittent PVC's and a first degree heart block, this has been previously documented.  Asymptomatic.Gladstone Lighter, RN,BSN 07/26/2017 4:41 PM

## 2017-07-30 ENCOUNTER — Ambulatory Visit (INDEPENDENT_AMBULATORY_CARE_PROVIDER_SITE_OTHER): Payer: 59 | Admitting: Surgical

## 2017-07-30 ENCOUNTER — Encounter (HOSPITAL_COMMUNITY): Payer: 59

## 2017-07-30 VITALS — BP 117/80 | HR 92 | Resp 20 | Ht 63.0 in | Wt 178.0 lb

## 2017-07-30 DIAGNOSIS — Z952 Presence of prosthetic heart valve: Secondary | ICD-10-CM

## 2017-07-30 DIAGNOSIS — Z951 Presence of aortocoronary bypass graft: Secondary | ICD-10-CM | POA: Diagnosis not present

## 2017-07-30 NOTE — Progress Notes (Signed)
[address]       [place] [address]             [phone]      Rachael Davis [facility] Medical Record #[id number] Date of Birth: 1956/05/16  Referring: [name], Peter M, MD Primary Care: Deatra James, MD  Chief Complaint:   POST OP FOLLOW UP DATE OF PROCEDURE:  04/10/2017 DATE OF DISCHARGE:                              OPERATIVE REPORT   OPERATIONS: 1. Coronary artery bypass grafting x4 (left internal mammary artery to     left anterior descending, saphenous vein graft to ramus     intermedius, saphenous vein graft to obtuse marginal, saphenous     vein graft to right coronary artery). 2. Endoscopic harvest of right leg greater saphenous vein. 3. Mitral valve replacement with a 25 mm [facility] Ease tissue     valve, serial [id number].  SURGEON:  Kerin Perna, M.D.  ASSISTANT:  Jari Favre, PA-C.  ANESTHESIA:  General.  PREOPERATIVE DIAGNOSIS: 1. Acute ST-elevation myocardial infarction with total occlusion of     the circumflex. 2. Ischemic cardiomyopathy. 3. Mitral valve regurgitation from both ischemic mitral regurgitation     and mild prolapse of anterior leaflet.  History of Present Illness:    Patient is a 61 year old female status post the above described procedure. She has had some difficulty healing her right below-knee EVH site and we are asked to see its progress in the office on today's date. Overall she does feel well. She does have some sternal discomfort at times . She denies any significant shortness of breath or anginal equivalent. She has not had any palpitations. She does have some mild lower extremity edema.    Past Medical History:  Diagnosis Date  . Arrhythmia    Hx PAF, PVC's  . CHF (congestive heart failure) (HCC)   . Coronary artery disease    04/04/2017 STEMI  . Diabetes (HCC)   . Hyperlipidemia   . Hypertension   . New onset atrial fibrillation (HCC) 05/2017     History  Smoking Status  .  Never Smoker  Smokeless Tobacco  . Never Used    History  Alcohol Use No     Allergies  Allergen Reactions  . Keflex [Cephalexin] Rash    Skin on feet peeled.  . Lisinopril Cough    Current Outpatient Prescriptions  Medication Sig Dispense Refill  . acetaminophen (TYLENOL) 325 MG tablet Take 2 tablets (650 mg total) by mouth every 6 (six) hours as needed for mild pain or headache.    Marland Kitchen aspirin EC 81 MG tablet Take 81 mg by mouth daily.    Marland Kitchen atorvastatin (LIPITOR) 20 MG tablet Take 1 tablet (20 mg total) by mouth daily at 6 PM. 30 tablet 3  . dapagliflozin propanediol (FARXIGA) 10 MG TABS tablet Take 10 mg by mouth daily.    . furosemide (LASIX) 40 MG tablet Take 40 mg by mouth daily.    Marland Kitchen glipiZIDE (GLUCOTROL XL) 10 MG 24 hr tablet Take 1 tablet (10 mg total) by mouth daily with breakfast.    . losartan (COZAAR) 25 MG tablet Take 0.5 tablets (12.5 mg total) by mouth at bedtime. 15 tablet 3  . magnesium oxide (MAG-OX) 400 (241.3 Mg) MG tablet TAKE 1 TABLET BY MOUTH EVERY DAY 30 tablet  11  . metFORMIN (GLUCOPHAGE) 1000 MG tablet Take 1,000 mg by mouth 2 (two) times daily with a meal.    . metoprolol succinate (TOPROL XL) 25 MG 24 hr tablet Take 1 tablet (25 mg total) by mouth daily. 90 tablet 3  . saxagliptin HCl (ONGLYZA) 5 MG TABS tablet Take 5 mg by mouth daily.    Marland Kitchen spironolactone (ALDACTONE) 25 MG tablet Take 1 tablet (25 mg total) by mouth daily. 30 tablet 1  . warfarin (COUMADIN) 2.5 MG tablet Take 1 to 1 and 1/2 tablet daily or as directed by coumadin clinic. 45 tablet 1   No current facility-administered medications for this visit.        Physical Exam: BP 117/80   Pulse 92   Resp 20   Ht 5\' 3"  (1.6 m)   Wt 178 lb (80.7 kg)   SpO2 98%   BMI 31.53 kg/m   General appearance: alert, cooperative and no distress Heart: regular rate and rhythm Lungs: clear to auscultation bilaterally Extremities: Minor edema bilateral lower extremities Wound: The right lower  extremity EVH site is healing slowly. There is some good granulation tissue at the base of the wound. There is slight amount of fibrinous exudate   Diagnostic Studies & Laboratory data:     Recent Radiology Findings:   No results found.    Recent Lab Findings: Lab Results  Component Value Date   WBC 8.8 06/29/2017   HGB 11.3 (L) 06/29/2017   HCT 37.0 06/29/2017   PLT 158 06/29/2017   GLUCOSE 125 (H) 07/17/2017   CHOL 168 04/06/2017   TRIG 67 04/06/2017   HDL 48 04/06/2017   LDLCALC 107 (H) 04/06/2017   ALT 23 05/23/2017   AST 19 05/23/2017   NA 140 07/17/2017   K 4.4 07/17/2017   CL 105 07/17/2017   CREATININE 1.52 (H) 07/17/2017   BUN 20 07/17/2017   CO2 26 07/17/2017   TSH 0.490 05/23/2017   INR 2.0 (A) 07/24/2017   HGBA1C 8.2 (H) 04/06/2017      Assessment / Plan:  The EVH site is showing good overall healing. She is using a Medihoney on the dressing and this appears to be working well. She is scheduled to see Dr. Maren Beach in October and she will continue the same dressing changes to then. If she has any new difficulties with the wound prior to that she will contact our office.          Rowe Clack, PA-C 07/30/2017 2:34 PM

## 2017-07-30 NOTE — Patient Instructions (Signed)
Continue current dressing changes

## 2017-07-31 ENCOUNTER — Ambulatory Visit (HOSPITAL_COMMUNITY)
Admission: RE | Admit: 2017-07-31 | Discharge: 2017-07-31 | Disposition: A | Discharge status: Home / Self Care | Payer: 59 | Source: Ambulatory Visit | Attending: Internal Medicine | Admitting: Internal Medicine

## 2017-07-31 DIAGNOSIS — I5043 Acute on chronic combined systolic (congestive) and diastolic (congestive) heart failure: Secondary | ICD-10-CM | POA: Diagnosis not present

## 2017-07-31 LAB — BASIC METABOLIC PANEL
Anion gap: 9 (ref 5–15)
BUN: 18 mg/dL (ref 6–20)
CO2: 24 mmol/L (ref 22–32)
Calcium: 9.5 mg/dL (ref 8.9–10.3)
Chloride: 106 mmol/L (ref 101–111)
Creatinine, Ser: 1.33 mg/dL — ABNORMAL HIGH (ref 0.44–1.00)
GFR calc Af Amer: 49 mL/min — ABNORMAL LOW (ref >60)
GFR calc non Af Amer: 42 mL/min — ABNORMAL LOW (ref >60)
Glucose, Bld: 106 mg/dL — ABNORMAL HIGH (ref 65–99)
Potassium: 4.5 mmol/L (ref 3.5–5.1)
Sodium: 139 mmol/L (ref 135–145)

## 2017-08-01 ENCOUNTER — Encounter (HOSPITAL_COMMUNITY): Payer: Self-pay

## 2017-08-01 ENCOUNTER — Encounter (HOSPITAL_COMMUNITY)
Admission: RE | Admit: 2017-08-01 | Discharge: 2017-08-01 | Disposition: A | Discharge status: Home / Self Care | Payer: 59 | Source: Ambulatory Visit | Attending: Internal Medicine | Admitting: Internal Medicine

## 2017-08-01 DIAGNOSIS — I213 ST elevation (STEMI) myocardial infarction of unspecified site: Secondary | ICD-10-CM

## 2017-08-01 DIAGNOSIS — Z951 Presence of aortocoronary bypass graft: Secondary | ICD-10-CM

## 2017-08-01 LAB — GLUCOSE, CAPILLARY
Glucose-Capillary: 116 mg/dL — ABNORMAL HIGH (ref 65–99)
Glucose-Capillary: 74 mg/dL (ref 65–99)

## 2017-08-01 NOTE — Progress Notes (Signed)
Incomplete Session Note  Patient Details  Name: Rachael Davis MRN: [id number] Date of Birth: 1956-10-17 Referring Provider:     CARDIAC REHAB PHASE II ORIENTATION from 07/26/2017 in [facility]ARDI[facility]  Referring Provider  Bensimhon, Reuel Boom MD      Renne Musca did not complete her rehab session.  CBG-74 upon arrival to cardiac rehab. Pt had eaten one orange for breakfast this morning and taken AM medications which include glucotrol, metformin, farxiga and onglyza.  Pt did not exercise. Pt diabetes managed by Dr. Wynelle Link.  Pt given peanut butter crackers and lemonade. Pt given exercising and diabetes handout. Pt instructed to add protein and more carbohydrate to her pre exercise meal.  Pt verbalized understanding.

## 2017-08-02 ENCOUNTER — Telehealth: Payer: Self-pay | Admitting: Pharmacist Clinician (PhC)/ Clinical Pharmacy Specialist

## 2017-08-02 NOTE — Telephone Encounter (Signed)
Coumadin letter 

## 2017-08-03 ENCOUNTER — Encounter (HOSPITAL_COMMUNITY)
Admission: RE | Admit: 2017-08-03 | Discharge: 2017-08-03 | Disposition: A | Discharge status: Home / Self Care | Payer: 59 | Source: Ambulatory Visit | Attending: Internal Medicine | Admitting: Internal Medicine

## 2017-08-03 DIAGNOSIS — Z951 Presence of aortocoronary bypass graft: Secondary | ICD-10-CM

## 2017-08-03 DIAGNOSIS — I213 ST elevation (STEMI) myocardial infarction of unspecified site: Secondary | ICD-10-CM | POA: Diagnosis not present

## 2017-08-03 LAB — GLUCOSE, CAPILLARY
Glucose-Capillary: 176 mg/dL — ABNORMAL HIGH (ref 65–99)
Glucose-Capillary: 151 mg/dL — ABNORMAL HIGH (ref 65–99)

## 2017-08-07 ENCOUNTER — Ambulatory Visit (INDEPENDENT_AMBULATORY_CARE_PROVIDER_SITE_OTHER): Payer: 59 | Admitting: Pharmacist

## 2017-08-07 DIAGNOSIS — Z951 Presence of aortocoronary bypass graft: Secondary | ICD-10-CM

## 2017-08-07 DIAGNOSIS — Z7901 Long term (current) use of anticoagulants: Secondary | ICD-10-CM

## 2017-08-07 DIAGNOSIS — I4891 Unspecified atrial fibrillation: Secondary | ICD-10-CM

## 2017-08-07 LAB — POCT INR: INR: 2.2

## 2017-08-08 ENCOUNTER — Telehealth: Payer: Self-pay

## 2017-08-08 ENCOUNTER — Encounter (HOSPITAL_COMMUNITY)
Admission: RE | Admit: 2017-08-08 | Discharge: 2017-08-08 | Disposition: A | Discharge status: Home / Self Care | Payer: 59 | Source: Ambulatory Visit | Attending: Internal Medicine | Admitting: Internal Medicine

## 2017-08-08 DIAGNOSIS — I213 ST elevation (STEMI) myocardial infarction of unspecified site: Secondary | ICD-10-CM | POA: Diagnosis not present

## 2017-08-08 DIAGNOSIS — Z951 Presence of aortocoronary bypass graft: Secondary | ICD-10-CM | POA: Insufficient documentation

## 2017-08-08 LAB — GLUCOSE, CAPILLARY
Glucose-Capillary: 134 mg/dL — ABNORMAL HIGH (ref 65–99)
Glucose-Capillary: 186 mg/dL — ABNORMAL HIGH (ref 65–99)

## 2017-08-08 NOTE — Telephone Encounter (Signed)
Mrs Alwood has been cleared to return to work full time with no restrictions on 08/13/17 per Dr Donata Clay

## 2017-08-10 ENCOUNTER — Other Ambulatory Visit: Payer: Self-pay | Admitting: Physician Assistant

## 2017-08-10 ENCOUNTER — Encounter (HOSPITAL_COMMUNITY)
Admission: RE | Admit: 2017-08-10 | Discharge: 2017-08-10 | Disposition: A | Discharge status: Home / Self Care | Payer: 59 | Source: Ambulatory Visit | Attending: Internal Medicine | Admitting: Internal Medicine

## 2017-08-10 DIAGNOSIS — I213 ST elevation (STEMI) myocardial infarction of unspecified site: Secondary | ICD-10-CM

## 2017-08-10 DIAGNOSIS — Z951 Presence of aortocoronary bypass graft: Secondary | ICD-10-CM

## 2017-08-10 LAB — GLUCOSE, CAPILLARY
Glucose-Capillary: 179 mg/dL — ABNORMAL HIGH (ref 65–99)
Glucose-Capillary: 192 mg/dL — ABNORMAL HIGH (ref 65–99)

## 2017-08-13 ENCOUNTER — Encounter (HOSPITAL_COMMUNITY): Payer: 59

## 2017-08-15 ENCOUNTER — Encounter (HOSPITAL_COMMUNITY): Admission: RE | Admit: 2017-08-15 | Payer: 59 | Source: Ambulatory Visit

## 2017-08-15 ENCOUNTER — Encounter (HOSPITAL_COMMUNITY)
Admission: RE | Admit: 2017-08-15 | Discharge: 2017-08-15 | Disposition: A | Discharge status: Home / Self Care | Payer: 59 | Source: Ambulatory Visit | Attending: Internal Medicine | Admitting: Internal Medicine

## 2017-08-15 DIAGNOSIS — I213 ST elevation (STEMI) myocardial infarction of unspecified site: Secondary | ICD-10-CM

## 2017-08-15 DIAGNOSIS — Z951 Presence of aortocoronary bypass graft: Secondary | ICD-10-CM

## 2017-08-15 NOTE — Progress Notes (Signed)
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  acetaminophen (TYLENOL) 325 MG tablet, Take 2 tablets (650 mg total) by mouth every 6 (six) hours as needed for mild pain or headache., Disp: , Rfl:  .  aspirin EC 81 MG tablet, Take 81 mg by mouth daily., Disp: , Rfl:  .  atorvastatin (LIPITOR) 20 MG tablet, Take 1 tablet (20 mg total) by mouth daily at 6 PM., Disp: 30 tablet, Rfl: 3 .  dapagliflozin propanediol (FARXIGA) 10 MG TABS tablet, Take 10 mg by mouth daily., Disp: , Rfl:  .  furosemide (LASIX) 40 MG tablet, Take 40 mg by mouth daily., Disp: , Rfl:  .  glipiZIDE (GLUCOTROL XL) 10 MG 24 hr tablet, Take 1 tablet (10 mg total) by mouth daily with breakfast., Disp: , Rfl:  .  losartan (COZAAR) 25 MG tablet, Take 0.5 tablets (12.5 mg total) by mouth at bedtime., Disp: 15 tablet, Rfl: 3 .  magnesium oxide (MAG-OX) 400 (241.3 Mg) MG tablet, TAKE 1 TABLET BY MOUTH EVERY DAY, Disp: 30 tablet, Rfl: 11 .  metFORMIN (GLUCOPHAGE) 1000 MG tablet, Take 1,000 mg by mouth 2 (two) times daily with a meal., Disp: , Rfl:  .  metoprolol succinate (TOPROL XL) 25 MG 24 hr tablet, Take 1 tablet (25 mg total) by mouth daily., Disp: 90 tablet, Rfl: 3 .  saxagliptin HCl (ONGLYZA) 5 MG TABS tablet, Take 5 mg by mouth daily., Disp: , Rfl:  .  spironolactone (ALDACTONE) 25 MG tablet, Take 1 tablet  (25 mg total) by mouth daily., Disp: 30 tablet, Rfl: 1 .  warfarin (COUMADIN) 2.5 MG tablet, Take 1 to 1 and 1/2 tablet daily or as directed by coumadin clinic., Disp: 45 tablet, Rfl: 1  Past Medical History: Past Medical History:  Diagnosis Date  . Arrhythmia    Hx PAF, PVC's  . CHF (congestive heart failure) ([place])   . Coronary artery disease    04/04/2017 STEMI  . Diabetes ([place])   . Hyperlipidemia   . Hypertension   . New onset atrial fibrillation ([place]) 05/2017    Tobacco Use: History  Smoking Status  . Never Smoker  Smokeless Tobacco  . Never Used    Labs: Recent Review Flowsheet Data    Labs for ITP Cardiac and Pulmonary Rehab Latest Ref Rng & Units 04/12/2017 04/12/2017 04/13/2017 04/14/2017 04/15/2017   Cholestrol 0 - 200 mg/dL - - - - -   LDLCALC 0 - 99 mg/dL - - - - -   HDL >40 mg/dL - - - - -   Trlycerides <150 mg/dL - - - - -   Hemoglobin A1c 4.8 - 5.6 % - - - - -   PHART 7.350 - 7.450 7.354 7.376 7.366 - -   PCO2ART 32.0 - 48.0 mmHg 37.1 34.1 36.2 - -   HCO3 20.0 - 28.0 mmol/L 20.6 20.2  20.2 - -   TCO2 0 - 100 mmol/L '22 21 21 '$ - -   ACIDBASEDEF 0.0 - 2.0 mmol/L 4.0(H) 5.0(H) 4.2(H) - -   O2SAT % 95.0 96.0 98.8 56.8 64.6      Capillary Blood Glucose: Lab Results  Component Value Date   GLUCAP 192 (H) 08/10/2017   GLUCAP 179 (H) 08/10/2017   GLUCAP 186 (H) 08/08/2017   GLUCAP 134 (H) 08/08/2017   GLUCAP 151 (H) 08/03/2017     Exercise Target Goals:    Exercise Program Goal: Individual exercise prescription set with THRR, safety & activity barriers. Participant demonstrates ability to understand and report RPE using BORG scale, to self-measure pulse accurately, and to acknowledge the importance of the exercise prescription.  Exercise Prescription Goal: Starting with aerobic activity 30 plus minutes a day, 3 days per week for initial exercise prescription. Provide home exercise prescription and guidelines that participant acknowledges understanding prior to  discharge.  Activity Barriers & Risk Stratification:     Activity Barriers & Cardiac Risk Stratification - 07/26/17 1350      Activity Barriers & Cardiac Risk Stratification   Activity Barriers Muscular Weakness;Deconditioning   Cardiac Risk Stratification High      6 Minute Walk:     6 Minute Walk    Row Name 07/26/17 1452         6 Minute Walk   Phase Initial     Distance 1343 feet     Walk Time 6 minutes     # of Rest Breaks 0     MPH 2.54     METS 3.14     RPE 13     VO2 Peak 10.99     Symptoms No     Resting HR 87 bpm     Resting BP 128/80     Max Ex. HR 113 bpm     Max Ex. BP 118/82     2 Minute Post BP 122/78        Oxygen Initial Assessment:   Oxygen Re-Evaluation:   Oxygen Discharge (Final Oxygen Re-Evaluation):   Initial Exercise Prescription:     Initial Exercise Prescription - 07/26/17 1400      Date of Initial Exercise RX and Referring Provider   Date 07/26/17   Referring Provider Bensimhon, Daniel MD     Recumbant Bike   Level 2   Minutes 10   METs 2     NuStep   Level 2   SPM 80   Minutes 10   METs 2     Track   Laps 8   Minutes 10   METs 2.74     Prescription Details   Frequency (times per week) 3   Duration Progress to 30 minutes of continuous aerobic without signs/symptoms of physical distress     Intensity   THRR 40-80% of Max Heartrate 63-126   Ratings of Perceived Exertion 11-13   Perceived Dyspnea 0-4     Progression   Progression Continue to progress workloads to maintain intensity without signs/symptoms of physical distress.     Resistance Training   Training Prescription Yes   Weight 2lbs   Reps 10-15      Perform Capillary Blood Glucose checks as needed.  Exercise Prescription Changes:     Exercise Prescription Changes    Row Name 08/03/17 1518 08/14/17 1500           Response to Exercise   Blood Pressure (Admit) 102/60 106/69  Blood Pressure (Exercise) 138/68 138/70      Blood  Pressure (Exit) 108/60 100/68      Heart Rate (Admit) 88 bpm 90 bpm      Heart Rate (Exercise) 118 bpm 117 bpm      Heart Rate (Exit) 94 bpm 95 bpm      Rating of Perceived Exertion (Exercise) 12 13      Symptoms non non      Comments Pt oriented to exercise equipment; pt responded well to exercise session.  Pt oriented to exercise equipment; pt responded well to exercise session.       Duration Continue with 30 min of aerobic exercise without signs/symptoms of physical distress. Continue with 30 min of aerobic exercise without signs/symptoms of physical distress.      Intensity THRR unchanged THRR unchanged        Progression   Progression Continue to progress workloads to maintain intensity without signs/symptoms of physical distress. Continue to progress workloads to maintain intensity without signs/symptoms of physical distress.      Average METs 2.3 2.9        Resistance Training   Training Prescription Yes Yes      Weight 2lbs 2lbs      Reps 10-15 10-15      Time 10 Minutes 10 Minutes        Recumbant Bike   Level 2 2      Minutes 10 10      METs 2 2.7        NuStep   Level 2 2      SPM 80 100      Minutes 10 10      METs 2.1 3.1        Track   Laps 9 11      Minutes 10 10      METs 2.57 2.92         Exercise Comments:     Exercise Comments    Row Name 08/03/17 1521           Exercise Comments pt was oriented to exercise equipment. Pt responded well to exercise session; will continue to monitor          Exercise Goals and Review:     Exercise Goals    Row Name 07/26/17 1351             Exercise Goals   Increase Physical Activity Yes       Intervention Provide advice, education, support and counseling about physical activity/exercise needs.;Develop an individualized exercise prescription for aerobic and resistive training based on initial evaluation findings, risk stratification, comorbidities and participant's personal goals.       Expected  Outcomes Achievement of increased cardiorespiratory fitness and enhanced flexibility, muscular endurance and strength shown through measurements of functional capacity and personal statement of participant.       Increase Strength and Stamina Yes  return to work and being able to keep and play with grandkids       Intervention Provide advice, education, support and counseling about physical activity/exercise needs.;Develop an individualized exercise prescription for aerobic and resistive training based on initial evaluation findings, risk stratification, comorbidities and participant's personal goals.       Expected Outcomes Achievement of increased cardiorespiratory fitness and enhanced flexibility, muscular endurance and strength shown through measurements of functional capacity and personal statement of participant.          Exercise Goals Re-Evaluation :  Exercise Goals Re-Evaluation    [place] Name 08/14/17 1555             Exercise Goal Re-Evaluation   Exercise Goals Review Increase Physical Activity;Able to understand and use rate of perceived exertion (RPE) scale;Knowledge and understanding of Target Heart Rate Range (THRR);Understanding of Exercise Prescription;Increase Strength and Stamina;Able to check pulse independently       Comments Pt is progressing wel in cardiac rehab. Pt has increased SPM and MET level on Nustep machine and is exercising for 30 minutes continuously.       Expected Outcomes Pt will continue to improve in cardiorespiratory fitness and overall functional capacity.           Discharge Exercise Prescription (Final Exercise Prescription Changes):     Exercise Prescription Changes - 08/14/17 1500      Response to Exercise   Blood Pressure (Admit) 106/69   Blood Pressure (Exercise) 138/70   Blood Pressure (Exit) 100/68   Heart Rate (Admit) 90 bpm   Heart Rate (Exercise) 117 bpm   Heart Rate (Exit) 95 bpm   Rating of Perceived Exertion (Exercise) 13    Symptoms non   Comments Pt oriented to exercise equipment; pt responded well to exercise session.    Duration Continue with 30 min of aerobic exercise without signs/symptoms of physical distress.   Intensity THRR unchanged     Progression   Progression Continue to progress workloads to maintain intensity without signs/symptoms of physical distress.   Average METs 2.9     Resistance Training   Training Prescription Yes   Weight 2lbs   Reps 10-15   Time 10 Minutes     Recumbant Bike   Level 2   Minutes 10   METs 2.7     NuStep   Level 2   SPM 100   Minutes 10   METs 3.1     Track   Laps 11   Minutes 10   METs 2.92      Nutrition:  Target Goals: Understanding of nutrition guidelines, daily intake of sodium '1500mg'$ , cholesterol '200mg'$ , calories 30% from fat and 7% or less from saturated fats, daily to have 5 or more servings of fruits and vegetables.  Biometrics:     Pre Biometrics - 07/26/17 1634      Pre Biometrics   Height '5\' 3"'$  (1.6 m)   Weight 178 lb 9.2 oz (81 kg)   Hip Circumference 44 inches   BMI (Calculated) 31.64   % Body Fat 44.2 %   Grip Strength 30 kg   Flexibility 9.5 in   Single Leg Stand 11.4 seconds       Nutrition Therapy Plan and Nutrition Goals:     Nutrition Therapy & Goals - 07/26/17 1548      Nutrition Therapy   Diet Carb Modified, Therapeutic Lifestyle Changes     Personal Nutrition Goals   Nutrition Goal Pt to identify food quantities necessary to achieve weight loss of 6-24 lb (2.7-10.9 kg) at graduation from cardiac rehab.    Personal Goal #2 Improved blood glucose control as evidenced by pt's a1c decreasing towards 7 or less.      Intervention Plan   Intervention Prescribe, educate and counsel regarding individualized specific dietary modifications aiming towards targeted core components such as weight, hypertension, lipid management, diabetes, heart failure and other comorbidities.   Expected Outcomes Short Term Goal:  Understand basic principles of dietary content, such as calories, fat, sodium, cholesterol and nutrients.;Long Term Goal:  Adherence to prescribed nutrition plan.      Nutrition Discharge: Nutrition Scores:     Nutrition Assessments - 07/26/17 1548      MEDFICTS Scores   Pre Score 21      Nutrition Goals Re-Evaluation:   Nutrition Goals Re-Evaluation:   Nutrition Goals Discharge (Final Nutrition Goals Re-Evaluation):   Psychosocial: Target Goals: Acknowledge presence or absence of significant depression and/or stress, maximize coping skills, provide positive support system. Participant is able to verbalize types and ability to use techniques and skills needed for reducing stress and depression.  Initial Review & Psychosocial Screening:     Initial Psych Review & Screening - 07/26/17 [place]? Yes     Barriers   Psychosocial barriers to participate in program There are no identifiable barriers or psychosocial needs.     Screening Interventions   Interventions Encouraged to exercise      Quality of Life Scores:     Quality of Life - 08/03/17 1654      Quality of Life Scores   GLOBAL Pre --  pt demonstrates health related anxiety. pt offered emotional support and reassurance. pt offered counseling with Jeanella Craze, declined stating she prefers to use her EAP.        PHQ-9: Recent Review Flowsheet Data    Depression screen [facility] 2/9 08/01/2017 08/01/2017   Decreased Interest - 0   Down, Depressed, Hopeless (No Data)  1   PHQ - 2 Score - 1     Interpretation of Total Score  Total Score Depression Severity:  1-4 = Minimal depression, 5-9 = Mild depression, 10-14 = Moderate depression, 15-19 = Moderately severe depression, 20-27 = Severe depression   Psychosocial Evaluation and Intervention:     Psychosocial Evaluation - 08/01/17 1624      Psychosocial Evaluation & Interventions   Interventions Encouraged to exercise  with the program and follow exercise prescription   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 no psychosocial needs identified, no interventions necessary    Expected Outcomes pt will exhibit positive outlook with good coping skills.    Continue Psychosocial Services  No Follow up required      Psychosocial Re-Evaluation:     Psychosocial Re-Evaluation    [place] Name 08/10/17 1655             Psychosocial Re-Evaluation   Current issues with None Identified       Comments no psychosocial needs identified, no interventions necessary.        Expected Outcomes pt will exhibit positive outlook with good coping skills.        Interventions Stress management education;Relaxation education;Encouraged to attend Cardiac Rehabilitation for the exercise       Continue Psychosocial Services  No Follow up required          Psychosocial Discharge (Final Psychosocial Re-Evaluation):     Psychosocial Re-Evaluation - 08/10/17 1655      Psychosocial Re-Evaluation   Current issues with None Identified   Comments no psychosocial needs identified, no interventions necessary.    Expected Outcomes pt will exhibit positive outlook with good coping skills.    Interventions Stress management education;Relaxation education;Encouraged to attend Cardiac Rehabilitation for the exercise   Continue Psychosocial Services  No Follow up required      Vocational Rehabilitation: Provide vocational rehab assistance to qualifying candidates.   Vocational Rehab Evaluation & Intervention:     Vocational Rehab -  07/26/17 1632      Initial Vocational Rehab Evaluation & Intervention   Assessment shows need for Vocational Rehabilitation No      Education: Education Goals: Education classes will be provided on a weekly basis, covering required topics. Participant will state understanding/return demonstration of topics presented.  Learning Barriers/Preferences:     Learning Barriers/Preferences  - 07/26/17 1452      Learning Barriers/Preferences   Learning Barriers Sight      Education Topics: Count Your Pulse:  -Group instruction provided by verbal instruction, demonstration, patient participation and written materials to support subject.  Instructors address importance of being able to find your pulse and how to count your pulse when at home without a heart monitor.  Patients get hands on experience counting their pulse with staff help and individually.   CARDIAC REHAB PHASE II EXERCISE from 08/10/2017 in [facility] CARDIAC REHAB  Date  08/10/17  Instruction Review Code  2- meets goals/outcomes      Heart Attack, Angina, and Risk Factor Modification:  -Group instruction provided by verbal instruction, video, and written materials to support subject.  Instructors address signs and symptoms of angina and heart attacks.    Also discuss risk factors for heart disease and how to make changes to improve heart health risk factors.   Functional Fitness:  -Group instruction provided by verbal instruction, demonstration, patient participation, and written materials to support subject.  Instructors address safety measures for doing things around the house.  Discuss how to get up and down off the floor, how to pick things up properly, how to safely get out of a chair without assistance, and balance training.   Meditation and Mindfulness:  -Group instruction provided by verbal instruction, patient participation, and written materials to support subject.  Instructor addresses importance of mindfulness and meditation practice to help reduce stress and improve awareness.  Instructor also leads participants through a meditation exercise.    Stretching for Flexibility and Mobility:  -Group instruction provided by verbal instruction, patient participation, and written materials to support subject.  Instructors lead participants through series of stretches that are designed to  increase flexibility thus improving mobility.  These stretches are additional exercise for major muscle groups that are typically performed during regular warm up and cool down.   Hands Only CPR:  -Group verbal, video, and participation provides a basic overview of AHA guidelines for community CPR. Role-play of emergencies allow participants the opportunity to practice calling for help and chest compression technique with discussion of AED use.   Hypertension: -Group verbal and written instruction that provides a basic overview of hypertension including the most recent diagnostic guidelines, risk factor reduction with self-care instructions and medication management.    Nutrition I class: Heart Healthy Eating:  -Group instruction provided by PowerPoint slides, verbal discussion, and written materials to support subject matter. The instructor gives an explanation and review of the Therapeutic Lifestyle Changes diet recommendations, which includes a discussion on lipid goals, dietary fat, sodium, fiber, plant stanol/sterol esters, sugar, and the components of a well-balanced, healthy diet.   Nutrition II class: Lifestyle Skills:  -Group instruction provided by PowerPoint slides, verbal discussion, and written materials to support subject matter. The instructor gives an explanation and review of label reading, grocery shopping for heart health, heart healthy recipe modifications, and ways to make healthier choices when eating out.   Diabetes Question & Answer:  -Group instruction provided by PowerPoint slides, verbal discussion, and written materials to support subject matter.  The instructor gives an explanation and review of diabetes co-morbidities, pre- and post-prandial blood glucose goals, pre-exercise blood glucose goals, signs, symptoms, and treatment of hypoglycemia and hyperglycemia, and foot care basics.   Diabetes Blitz:  -Group instruction provided by PowerPoint slides, verbal  discussion, and written materials to support subject matter. The instructor gives an explanation and review of the physiology behind type 1 and type 2 diabetes, diabetes medications and rational behind using different medications, pre- and post-prandial blood glucose recommendations and Hemoglobin A1c goals, diabetes diet, and exercise including blood glucose guidelines for exercising safely.    Portion Distortion:  -Group instruction provided by PowerPoint slides, verbal discussion, written materials, and food models to support subject matter. The instructor gives an explanation of serving size versus portion size, changes in portions sizes over the last 20 years, and what consists of a serving from each food group.   Stress Management:  -Group instruction provided by verbal instruction, video, and written materials to support subject matter.  Instructors review role of stress in heart disease and how to cope with stress positively.     CARDIAC REHAB PHASE II EXERCISE from 08/10/2017 in [place]  Date  08/01/17  Instruction Review Code  2- meets goals/outcomes      Exercising on Your Own:  -Group instruction provided by verbal instruction, power point, and written materials to support subject.  Instructors discuss benefits of exercise, components of exercise, frequency and intensity of exercise, and end points for exercise.  Also discuss use of nitroglycerin and activating EMS.  Review options of places to exercise outside of rehab.  Review guidelines for sex with heart disease.   Cardiac Drugs I:  -Group instruction provided by verbal instruction and written materials to support subject.  Instructor reviews cardiac drug classes: antiplatelets, anticoagulants, beta blockers, and statins.  Instructor discusses reasons, side effects, and lifestyle considerations for each drug class.   Cardiac Drugs II:  -Group instruction provided by verbal instruction and  written materials to support subject.  Instructor reviews cardiac drug classes: angiotensin converting enzyme inhibitors (ACE-I), angiotensin II receptor blockers (ARBs), nitrates, and calcium channel blockers.  Instructor discusses reasons, side effects, and lifestyle considerations for each drug class.   Anatomy and Physiology of the Circulatory System:  Group verbal and written instruction and models provide basic cardiac anatomy and physiology, with the coronary electrical and arterial systems. Review of: AMI, Angina, Valve disease, Heart Failure, Peripheral Artery Disease, Cardiac Arrhythmia, Pacemakers, and the ICD.   Other Education:  -Group or individual verbal, written, or video instructions that support the educational goals of the cardiac rehab program.   Knowledge Questionnaire Score:     Knowledge Questionnaire Score - 07/26/17 1457      Knowledge Questionnaire Score   Pre Score 21/24      Core Components/Risk Factors/Patient Goals at Admission:     Personal Goals and Risk Factors at Admission - 08/10/17 1651      Core Components/Risk Factors/Patient Goals on Admission    Weight Management --   Expected Outcomes --      Core Components/Risk Factors/Patient Goals Review:      Goals and Risk Factor Review    Row Name 08/10/17 1652             Core Components/Risk Factors/Patient Goals Review   Personal Goals Review Weight Management/Obesity;Diabetes;Lipids;Hypertension;Other       Review pt with multiple CAD RF demonstrates eagerness to participate in CR activities. pt plans  to return to work next week, however has made arrangements to continue CR participation.         Expected Outcomes pt will participate in CR exercise, nutrition and lifestyle modifications to reduce overall RF.           Core Components/Risk Factors/Patient Goals at Discharge (Final Review):      Goals and Risk Factor Review - 08/10/17 1652      Core Components/Risk Factors/Patient  Goals Review   Personal Goals Review Weight Management/Obesity;Diabetes;Lipids;Hypertension;Other   Review pt with multiple CAD RF demonstrates eagerness to participate in CR activities. pt plans to return to work next week, however has made arrangements to continue CR participation.     Expected Outcomes pt will participate in CR exercise, nutrition and lifestyle modifications to reduce overall RF.       ITP Comments:     ITP Comments    Row Name 07/26/17 1346 08/15/17 0814         ITP Comments Medical Director, Dr. Fransico Him Medical Director, Dr. Fransico Him         Comments: Pt is making expected progress toward personal goals after completing 4 sessions. Recommend continued exercise and life style modification education including  stress management and relaxation techniques to decrease cardiac risk profile.

## 2017-08-17 ENCOUNTER — Telehealth: Payer: Self-pay | Admitting: *Deleted

## 2017-08-17 ENCOUNTER — Telehealth (HOSPITAL_COMMUNITY): Payer: Self-pay | Admitting: *Deleted

## 2017-08-17 ENCOUNTER — Encounter (HOSPITAL_COMMUNITY)
Admission: RE | Admit: 2017-08-17 | Discharge: 2017-08-17 | Disposition: A | Discharge status: Home / Self Care | Payer: 59 | Source: Ambulatory Visit | Attending: Internal Medicine | Admitting: Internal Medicine

## 2017-08-17 ENCOUNTER — Other Ambulatory Visit: Payer: Self-pay | Admitting: Physician Assistant

## 2017-08-17 ENCOUNTER — Encounter (HOSPITAL_COMMUNITY): Payer: 59

## 2017-08-17 DIAGNOSIS — Z951 Presence of aortocoronary bypass graft: Secondary | ICD-10-CM

## 2017-08-17 DIAGNOSIS — I213 ST elevation (STEMI) myocardial infarction of unspecified site: Secondary | ICD-10-CM | POA: Diagnosis not present

## 2017-08-17 NOTE — Progress Notes (Signed)
Daily Session Note  Patient Details  Name: Rachael Davis MRN: [id number] Date of Birth: 14-Oct-1956 Referring Provider:     CARDIAC REHAB PHASE II ORIENTATION from 07/26/2017 in [facility]  Referring Provider  Glori Bickers MD      Encounter Date: 08/17/2017  Check In:     Session Check In - 08/17/17 0645      Check-In   Location [facility]   Staff Present Seward Carol, MS, ACSM CEP, Exercise Physiologist;Portia Rollene Rotunda, RN, BSN;Evoleth Nordmeyer, RN, BSN   Supervising physician immediately available to respond to emergencies [facility] immediately available   Physician(s) Dr. Karleen Hampshire   Medication changes reported     No   Fall or balance concerns reported    No   Tobacco Cessation No Change   Warm-up and Cool-down Performed as group-led instruction   Resistance Training Performed Yes   VAD Patient? No     Pain Assessment   Currently in Pain? No/denies   Multiple Pain Sites No      Capillary Blood Glucose:/ No results found for this or any previous visit (from the past 24 hour(s)).    History  Smoking Status  . Never Smoker  Smokeless Tobacco  . Never Used    Goals Met:  Exercise tolerated well  Goals Unmet:   Weight up  1.4kg from 08/15/2017  Comments: pt weight up 1.4kg from 08/15/2017 (82.4kg up from 81.0kg   Pt c/o DOE and fatigue more than usual otherwise asymptomatic.  Pt denies pain, orthopnea, rest dyspnea. Pt lungs clear, trace pedal edema.  02sat-100%.  Pt denies  change in routine except returning to work full time this week.   [facility] made aware.  Jasmine will review with MD and advise pt.     Dr. Fransico Him is Medical Director for Cardiac Rehab at [facility].

## 2017-08-17 NOTE — Telephone Encounter (Signed)
Spoke with patient about [employer]) Heart Failure Study.  Rachael Davis wants to discuss participation in research with her family and will update the Research Office on Monday.

## 2017-08-17 NOTE — Telephone Encounter (Signed)
Left message for [name] about participating in the [place] Heart Failure Research Study.  Waiting for return call and will provide additional information at that time.  Additionally, will try to reach out to patient at a later time.

## 2017-08-17 NOTE — Telephone Encounter (Signed)
Advanced Heart Failure Triage Encounter  Patient Name: Rachael Davis   Date of Call: 08/17/2017  Problem:  Cardiac Rehab called and stated patients weight is up 3lbs since Thursday with trace edema. Patient is more short of breath with exertion. Patient takes Lasix 40mg  daily and spiro 25 daily.    Plan:  Per Amy patient should take lasix 40mg  bid x 2days. Patient aware and agreeable.   Modesta Messing, CMA

## 2017-08-20 ENCOUNTER — Encounter (HOSPITAL_COMMUNITY): Payer: 59

## 2017-08-21 ENCOUNTER — Other Ambulatory Visit: Payer: Self-pay | Admitting: Cardiovascular Disease

## 2017-08-21 NOTE — Telephone Encounter (Signed)
Please review for refill, thanks ! 

## 2017-08-22 ENCOUNTER — Encounter (HOSPITAL_COMMUNITY)
Admission: RE | Admit: 2017-08-22 | Discharge: 2017-08-22 | Disposition: A | Discharge status: Home / Self Care | Payer: 59 | Source: Ambulatory Visit | Attending: Internal Medicine | Admitting: Internal Medicine

## 2017-08-22 ENCOUNTER — Encounter (HOSPITAL_COMMUNITY): Payer: 59

## 2017-08-22 ENCOUNTER — Other Ambulatory Visit (HOSPITAL_COMMUNITY): Payer: Self-pay | Admitting: *Deleted

## 2017-08-22 DIAGNOSIS — Z951 Presence of aortocoronary bypass graft: Secondary | ICD-10-CM

## 2017-08-22 DIAGNOSIS — I213 ST elevation (STEMI) myocardial infarction of unspecified site: Secondary | ICD-10-CM | POA: Diagnosis not present

## 2017-08-22 MED ORDER — FUROSEMIDE 40 MG PO TABS: 40.0000 mg | ORAL_TABLET | Freq: Every day | ORAL | 3 refills | Status: DC

## 2017-08-22 NOTE — Telephone Encounter (Signed)
Please review for refill, Thanks !  

## 2017-08-23 ENCOUNTER — Other Ambulatory Visit (HOSPITAL_COMMUNITY): Payer: Self-pay | Admitting: *Deleted

## 2017-08-24 ENCOUNTER — Encounter (HOSPITAL_COMMUNITY): Payer: 59

## 2017-08-24 ENCOUNTER — Telehealth (HOSPITAL_COMMUNITY): Payer: Self-pay | Admitting: *Deleted

## 2017-08-24 ENCOUNTER — Encounter (HOSPITAL_COMMUNITY)
Admission: RE | Admit: 2017-08-24 | Discharge: 2017-08-24 | Disposition: A | Discharge status: Home / Self Care | Payer: 59 | Source: Ambulatory Visit | Attending: Internal Medicine | Admitting: Internal Medicine

## 2017-08-24 DIAGNOSIS — I213 ST elevation (STEMI) myocardial infarction of unspecified site: Secondary | ICD-10-CM | POA: Diagnosis not present

## 2017-08-24 DIAGNOSIS — Z951 Presence of aortocoronary bypass graft: Secondary | ICD-10-CM

## 2017-08-24 NOTE — Telephone Encounter (Signed)
Follow up   Pt verbalized that she is calling for her REFILL   *STAT* If patient is at the pharmacy, call can be transferred to refill team.   1. Which medications need to be refilled? (please list name of each medication and dose if known) lipitor 20mg  2. Which pharmacy/location (including street and city if local pharmacy) is medication to be sent to? [facility] [address]  3. Do they need a 30 day or 90 day supply? 90

## 2017-08-24 NOTE — Telephone Encounter (Signed)
Advanced Heart Failure Triage Encounter  Patient Name: Rachael Davis  Date of Call: 08/24/17  Problem: Med questions    Patient left message on triage line wanting to verify her lasix dosage.  She said I could a leave a message if she did not pickup.  Plan:  Called and left VM letting the patient know she should be taking lasix 40 mg Once Daily  Aimie Wagman, Evlyn Kanner, RN

## 2017-08-25 ENCOUNTER — Other Ambulatory Visit: Payer: Self-pay | Admitting: Physician Assistant

## 2017-08-27 ENCOUNTER — Encounter (HOSPITAL_COMMUNITY)
Admission: RE | Admit: 2017-08-27 | Discharge: 2017-08-27 | Disposition: A | Discharge status: Home / Self Care | Payer: 59 | Source: Ambulatory Visit | Attending: Internal Medicine | Admitting: Internal Medicine

## 2017-08-27 ENCOUNTER — Encounter (HOSPITAL_COMMUNITY): Payer: 59

## 2017-08-27 DIAGNOSIS — I213 ST elevation (STEMI) myocardial infarction of unspecified site: Secondary | ICD-10-CM

## 2017-08-27 DIAGNOSIS — Z951 Presence of aortocoronary bypass graft: Secondary | ICD-10-CM

## 2017-08-27 MED ORDER — ATORVASTATIN CALCIUM 20 MG PO TABS: 20.0000 mg | ORAL_TABLET | Freq: Every day | ORAL | 1 refills | Status: DC

## 2017-08-27 NOTE — Telephone Encounter (Signed)
Refill authorization submitted electronically to patient's preferred pharmacy. 

## 2017-08-29 ENCOUNTER — Encounter (HOSPITAL_COMMUNITY)
Admission: RE | Admit: 2017-08-29 | Discharge: 2017-08-29 | Disposition: A | Discharge status: Home / Self Care | Payer: 59 | Source: Ambulatory Visit | Attending: Internal Medicine | Admitting: Internal Medicine

## 2017-08-29 ENCOUNTER — Encounter (HOSPITAL_COMMUNITY): Payer: 59

## 2017-08-29 DIAGNOSIS — Z951 Presence of aortocoronary bypass graft: Secondary | ICD-10-CM

## 2017-08-29 DIAGNOSIS — I213 ST elevation (STEMI) myocardial infarction of unspecified site: Secondary | ICD-10-CM

## 2017-08-31 ENCOUNTER — Encounter (HOSPITAL_COMMUNITY): Payer: 59

## 2017-08-31 ENCOUNTER — Encounter (HOSPITAL_COMMUNITY)
Admission: RE | Admit: 2017-08-31 | Discharge: 2017-08-31 | Disposition: A | Discharge status: Home / Self Care | Payer: 59 | Source: Ambulatory Visit | Attending: Internal Medicine | Admitting: Internal Medicine

## 2017-08-31 ENCOUNTER — Other Ambulatory Visit: Payer: Self-pay | Admitting: Cardiology

## 2017-08-31 DIAGNOSIS — Z951 Presence of aortocoronary bypass graft: Secondary | ICD-10-CM

## 2017-08-31 DIAGNOSIS — I213 ST elevation (STEMI) myocardial infarction of unspecified site: Secondary | ICD-10-CM

## 2017-08-31 NOTE — Telephone Encounter (Signed)
REFILL 

## 2017-08-31 NOTE — Progress Notes (Signed)
Rachael Davis 61 y.o. female DOB: 09-24-1956 MRN: [id number]      Nutrition Note  1. 04/04/17 ST elevation myocardial infarction (STEMI), unspecified artery ([place])   2. 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 Farxiga, Glucotrol XL, Metformin, Onglyza, Coumadin noted  Lab Results  Component Value Date   HGBA1C 8.2 (H) 04/06/2017   Nutrition Note Spoke with pt. Nutrition plan and survey reviewed with pt. Pt is following Step 2 of the Therapeutic Lifestyle Changes diet. Pt c/o gaining wt since starting rehab and her appetite is back to normal; pt had lost 30 lb post-op due to decreased appetite and fear of eating. Pt wants to lose wt. Wt loss tips discussed. 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 Pt is diabetic. Last A1c indicates blood glucose poorly controlled. Pt checks CBG's every other day. Fasting CBG's the past 2 checks were 98 mg/dL and 111 mg/dL. Recommended fasting CBG range reviewed. Pt expressed understanding of the information reviewed. Pt aware of nutrition education classes offered and plans on attending nutrition classes.  Nutrition Diagnosis ? Food-and nutrition-related knowledge deficit related to lack of exposure to information as related to diagnosis of: ? CVD ? DM  ? Obesity related to excessive energy intake as evidenced by a BMI of 31.6  Nutrition Intervention ? Pt's individual nutrition plan reviewed with pt. ? Benefits of adopting Therapeutic Lifestyle Changes discussed when Medficts reviewed.   ? Pt to discuss getting a stand-up desk at her office ? Pt given handouts for: ? Nutrition I class ? Nutrition II class ? Diabetes Blitz Class   Nutrition Goal(s):  ? Pt to identify food quantities necessary to achieve weight loss of 6-24 lb (2.7-10.9 kg) at graduation from cardiac rehab.  ? Improved blood glucose control as evidenced by pt's a1c decreasing towards 7 or less.  Plan:  Pt to attend nutrition classes:  ? Nutrition I   ?  Nutrition II  ? Portion Distortion  ? Diabetes Blitz  ? Diabetes Q & A - met 08/17/17 Will provide client-centered nutrition education as part of interdisciplinary care.   Monitor and evaluate progress toward nutrition goal with team.  Derek Mound, M.Ed, RD, LDN, CDE 08/31/2017 8:10 AM

## 2017-09-03 ENCOUNTER — Encounter (HOSPITAL_COMMUNITY)
Admission: RE | Admit: 2017-09-03 | Discharge: 2017-09-03 | Disposition: A | Discharge status: Home / Self Care | Payer: 59 | Source: Ambulatory Visit | Attending: Internal Medicine | Admitting: Internal Medicine

## 2017-09-03 ENCOUNTER — Encounter (HOSPITAL_COMMUNITY): Payer: 59

## 2017-09-03 DIAGNOSIS — Z951 Presence of aortocoronary bypass graft: Secondary | ICD-10-CM

## 2017-09-03 DIAGNOSIS — I213 ST elevation (STEMI) myocardial infarction of unspecified site: Secondary | ICD-10-CM | POA: Diagnosis not present

## 2017-09-04 ENCOUNTER — Ambulatory Visit (INDEPENDENT_AMBULATORY_CARE_PROVIDER_SITE_OTHER): Payer: 59 | Admitting: Pharmacist Clinician (PhC)/ Clinical Pharmacy Specialist

## 2017-09-04 DIAGNOSIS — Z7901 Long term (current) use of anticoagulants: Secondary | ICD-10-CM | POA: Diagnosis not present

## 2017-09-04 DIAGNOSIS — Z951 Presence of aortocoronary bypass graft: Secondary | ICD-10-CM

## 2017-09-04 DIAGNOSIS — I4891 Unspecified atrial fibrillation: Secondary | ICD-10-CM | POA: Diagnosis not present

## 2017-09-04 LAB — POCT INR: INR: 1.3

## 2017-09-05 ENCOUNTER — Encounter (HOSPITAL_COMMUNITY)
Admission: RE | Admit: 2017-09-05 | Discharge: 2017-09-05 | Disposition: A | Discharge status: Home / Self Care | Payer: 59 | Source: Ambulatory Visit | Attending: Internal Medicine | Admitting: Internal Medicine

## 2017-09-05 ENCOUNTER — Encounter (HOSPITAL_COMMUNITY): Payer: 59

## 2017-09-05 ENCOUNTER — Encounter: Payer: Managed Care, Other (non HMO) | Admitting: Cardiothoracic Surgery

## 2017-09-05 DIAGNOSIS — I213 ST elevation (STEMI) myocardial infarction of unspecified site: Secondary | ICD-10-CM

## 2017-09-05 DIAGNOSIS — Z951 Presence of aortocoronary bypass graft: Secondary | ICD-10-CM

## 2017-09-05 NOTE — Progress Notes (Signed)
Reviewed home exercise guidelines with patient including endpoints, temperature precautions, target heart rate and rate of perceived exertion. Pt is currently walking 10 -15 minutes 1 day/week as her mode of home exercise. Encouraged patient to increase duration to 30 minutes continuously or by accumulating 10 minutes 3x's/day or 15 minutes twice/day, and pt is amenable to this. Pt voices understanding of instructions given. Artist Pais, MS, ACSM CEP

## 2017-09-07 ENCOUNTER — Encounter (HOSPITAL_COMMUNITY): Payer: 59

## 2017-09-07 ENCOUNTER — Encounter (HOSPITAL_COMMUNITY)
Admission: RE | Admit: 2017-09-07 | Discharge: 2017-09-07 | Disposition: A | Discharge status: Home / Self Care | Payer: 59 | Source: Ambulatory Visit | Attending: Internal Medicine | Admitting: Internal Medicine

## 2017-09-07 DIAGNOSIS — I213 ST elevation (STEMI) myocardial infarction of unspecified site: Secondary | ICD-10-CM | POA: Diagnosis not present

## 2017-09-07 DIAGNOSIS — Z951 Presence of aortocoronary bypass graft: Secondary | ICD-10-CM

## 2017-09-10 ENCOUNTER — Other Ambulatory Visit: Payer: Self-pay

## 2017-09-10 ENCOUNTER — Telehealth (HOSPITAL_COMMUNITY): Payer: Self-pay | Admitting: *Deleted

## 2017-09-10 ENCOUNTER — Ambulatory Visit (HOSPITAL_COMMUNITY)
Admission: RE | Admit: 2017-09-10 | Discharge: 2017-09-10 | Disposition: A | Discharge status: Home / Self Care | Payer: 59 | Source: Ambulatory Visit | Attending: Internal Medicine | Admitting: Internal Medicine

## 2017-09-10 ENCOUNTER — Encounter (HOSPITAL_COMMUNITY): Payer: 59

## 2017-09-10 ENCOUNTER — Encounter (HOSPITAL_COMMUNITY)
Admission: RE | Admit: 2017-09-10 | Discharge: 2017-09-10 | Disposition: A | Discharge status: Home / Self Care | Payer: 59 | Source: Ambulatory Visit | Attending: Internal Medicine | Admitting: Internal Medicine

## 2017-09-10 VITALS — BP 146/88 | HR 100 | Wt 183.5 lb

## 2017-09-10 DIAGNOSIS — I5023 Acute on chronic systolic (congestive) heart failure: Secondary | ICD-10-CM | POA: Diagnosis not present

## 2017-09-10 DIAGNOSIS — Z951 Presence of aortocoronary bypass graft: Secondary | ICD-10-CM | POA: Diagnosis not present

## 2017-09-10 DIAGNOSIS — I213 ST elevation (STEMI) myocardial infarction of unspecified site: Secondary | ICD-10-CM

## 2017-09-10 DIAGNOSIS — I110 Hypertensive heart disease with heart failure: Secondary | ICD-10-CM | POA: Insufficient documentation

## 2017-09-10 DIAGNOSIS — I5043 Acute on chronic combined systolic (congestive) and diastolic (congestive) heart failure: Secondary | ICD-10-CM | POA: Diagnosis not present

## 2017-09-10 DIAGNOSIS — Z7984 Long term (current) use of oral hypoglycemic drugs: Secondary | ICD-10-CM | POA: Diagnosis not present

## 2017-09-10 DIAGNOSIS — I493 Ventricular premature depolarization: Secondary | ICD-10-CM

## 2017-09-10 DIAGNOSIS — R0600 Dyspnea, unspecified: Secondary | ICD-10-CM

## 2017-09-10 DIAGNOSIS — E119 Type 2 diabetes mellitus without complications: Secondary | ICD-10-CM | POA: Diagnosis not present

## 2017-09-10 DIAGNOSIS — Z79899 Other long term (current) drug therapy: Secondary | ICD-10-CM | POA: Diagnosis not present

## 2017-09-10 DIAGNOSIS — Z953 Presence of xenogenic heart valve: Secondary | ICD-10-CM | POA: Insufficient documentation

## 2017-09-10 DIAGNOSIS — I480 Paroxysmal atrial fibrillation: Secondary | ICD-10-CM | POA: Diagnosis not present

## 2017-09-10 DIAGNOSIS — I255 Ischemic cardiomyopathy: Secondary | ICD-10-CM | POA: Insufficient documentation

## 2017-09-10 DIAGNOSIS — I2510 Atherosclerotic heart disease of native coronary artery without angina pectoris: Secondary | ICD-10-CM | POA: Diagnosis not present

## 2017-09-10 DIAGNOSIS — Z7982 Long term (current) use of aspirin: Secondary | ICD-10-CM | POA: Insufficient documentation

## 2017-09-10 LAB — BASIC METABOLIC PANEL
Anion gap: 12 (ref 5–15)
BUN: 16 mg/dL (ref 6–20)
CO2: 20 mmol/L — ABNORMAL LOW (ref 22–32)
Calcium: 9.3 mg/dL (ref 8.9–10.3)
Chloride: 107 mmol/L (ref 101–111)
Creatinine, Ser: 1.02 mg/dL — ABNORMAL HIGH (ref 0.44–1.00)
GFR calc Af Amer: >60 mL/min (ref >60)
GFR calc non Af Amer: 58 mL/min — ABNORMAL LOW (ref >60)
Glucose, Bld: 200 mg/dL — ABNORMAL HIGH (ref 65–99)
Potassium: 4.6 mmol/L (ref 3.5–5.1)
Sodium: 139 mmol/L (ref 135–145)

## 2017-09-10 MED ORDER — FUROSEMIDE 10 MG/ML IJ SOLN
80.0000 mg | Freq: Once | INTRAMUSCULAR | Status: AC
  Administered 2017-09-10: 80 mg via INTRAVENOUS
  Filled 2017-09-10 (×2): qty 8

## 2017-09-10 MED ORDER — FUROSEMIDE 40 MG PO TABS: ORAL_TABLET | ORAL | 3 refills | Status: DC

## 2017-09-10 MED ORDER — SPIRONOLACTONE 25 MG PO TABS: 12.5000 mg | ORAL_TABLET | Freq: Every day | ORAL | 3 refills | Status: DC

## 2017-09-10 NOTE — Addendum Note (Signed)
Encounter addended by: Georgina Peer, RN on: 09/10/2017 11:09 AM<BR>    Actions taken: Sign clinical note

## 2017-09-10 NOTE — Progress Notes (Signed)
PIV 24 g x 1 attempt inserted to LLW per Dr. Gala Romney VO for IV lasix 80 mg x 1 dose admin pushed over 2 min in CHF clinic. Patient tolerated well, saline locked, patient's husband in room, call bell in reach, blanket given to patient for comfort, will continue to monitor closely. PIV removed before patient discharged home.  Ave Filter, RN

## 2017-09-10 NOTE — Progress Notes (Signed)
Pt arrived at cardiac rehab c/o dyspnea on exertion.  Although pt has visible dyspnea at rest. Pt denies pain, orthopnea.  Symptoms increasingly worsened over past few days.  Pt took additional lasix 40mg  x1 day without relief.  Pt did not exercise.  HR-116, 02sat-95%, lungs clear, diminished, 1+ pedal edema.  Pt weight 83.9kg up from 82.8kg on 09/08/2015.  PC to CHF clinic.  Per Herbert Seta, RN, pt will be worked in to see Dr. Gala Romney today.  CXR obtained.  Pt transferred to radiology in stable condition.  Deveron Furlong, RN, BSN Cardiac Pulmonary Rehab

## 2017-09-10 NOTE — Progress Notes (Signed)
Patient voided 600 mls.   IV removed prior to patient leaving clinic. Site dry and intact.

## 2017-09-10 NOTE — Patient Instructions (Signed)
Labs today (will call for abnormal results, otherwise no news is good news)  INCREASE Lasix to 80 mg In the AM the 40 mg in the PM  START taking Spironolactone 12.5 (0.5 Tablet) Once Daily  You have been scheduled an echocardiogram and Follow up this Friday @ 2:00.

## 2017-09-10 NOTE — Telephone Encounter (Signed)
Randa Evens called concerned about pt's SOB.  She states pt appears dyspneic at rest and c/o SOB w/exertion, pt sats 95% on RA HR 110 BP 124/70.  She states pt's wt is stable and pt only has slight LE edema.  Pt has been taking Lasix 40 mg daily.  Discussed all w/Dr Bensimhon, he recommends pt get chest xray and see him in the clinic.  Randa Evens is aware, she will send pt to radiology and then to our clinic for appt.  Order placed, appt sch.

## 2017-09-10 NOTE — Progress Notes (Signed)
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 Diuresed with IV lasix and transitioned to lasix 40 mg twice a day. Also had lots of PVCs so started on amiodarone. Discharge weight was 177 pounds.   She returns for an acute visit. Referred from cardiac rehab due to progressive dyspnea. Was seen in [facility] office recently and spiro and amio stopped.   Over past 2 weeks has been more SOB with activity. Weight up 7 pounds. Remains on lasix 40 daily. She did double her lasix for one day without much relief. Over last few days notices SOB on any exertion. Unable to walk in from parking lot. + orthopnea. No CP. Had CXR today with increase CHF (viewed personally). No dizziness    ECHO 05/11/2017 - Left ventricle: The cavity size was moderately dilated. Wall thickness was increased in a pattern of mild LVH. Systolic function was moderately reduced. The estimated ejection fraction was in the range of 35% to 40%. Diffuse hypokinesis. The study is not technically sufficient to allow evaluation of LV diastolic function. - Mitral valve: Normal appearing bioprosthetic MV no perivalvular regurgitation. - Left atrium: The atrium was mildly dilated. - Atrial septum: No defect or patent foramen ovale was identified. - Pulmonary arteries: PA peak pressure: 35 mm Hg (S).  LHC 04/04/2017   Prox LAD lesion, 75 %stenosed.  Prox LAD to Mid LAD lesion, 65 %stenosed.  Mid LAD to Dist LAD lesion, 55 %stenosed.  Ramus lesion, 90 %stenosed.  Lat Ramus lesion, 50 %stenosed.  Prox Cx lesion, 100 %stenosed.  Prox RCA to Mid RCA lesion, 95 %stenosed.  There is moderate left ventricular systolic dysfunction.  LV end diastolic pressure is mildly elevated.  The left ventricular ejection fraction is 35-45% by visual  estimate.  There is mild (2+) mitral regurgitation. 1. Severe 3 vessel obstructive CAD 2. Moderate LV dysfunction 3. Mildly elevated LV EDP ROS: All systems negative except as listed in HPI, PMH and Problem List.  SH:  Social History   Social History  . Marital status: Married    Spouse name: N/A  . Number of children: N/A  . Years of education: N/A   Occupational History  . Not on file.   Social History Main Topics  . Smoking status: Never Smoker  . Smokeless tobacco: Never Used  . Alcohol use No  . Drug use: No  . Sexual activity: Not on file   Other Topics Concern  . Not on file   Social History Narrative  . No narrative on file    FH:  Family History  Problem Relation Age of Onset  . Heart attack Father 35    Past Medical History:  Diagnosis Date  . Arrhythmia    Hx PAF, PVC's  . CHF (congestive heart failure) (HCC)   . Coronary artery disease    04/04/2017 STEMI  . Diabetes (HCC)   . Hyperlipidemia   . Hypertension   . New onset atrial fibrillation (HCC) 05/2017    Current Outpatient Prescriptions  Medication Sig Dispense Refill  . acetaminophen (TYLENOL) 325 MG tablet Take 2 tablets (650 mg total) by mouth every 6 (six) hours as needed for mild pain or headache.    Marland Kitchen aspirin EC 81 MG tablet Take 81 mg by mouth daily.    Marland Kitchen  atorvastatin (LIPITOR) 20 MG tablet Take 1 tablet (20 mg total) by mouth daily at 6 PM. 30 tablet 1  . dapagliflozin propanediol (FARXIGA) 10 MG TABS tablet Take 10 mg by mouth daily.    . furosemide (LASIX) 40 MG tablet Take 1 tablet (40 mg total) by mouth daily. 30 tablet 3  . glipiZIDE (GLUCOTROL XL) 10 MG 24 hr tablet Take 1 tablet (10 mg total) by mouth daily with breakfast.    . losartan (COZAAR) 25 MG tablet Take 0.5 tablets (12.5 mg total) by mouth at bedtime. 15 tablet 3  . magnesium oxide (MAG-OX) 400 (241.3 Mg) MG tablet TAKE 1 TABLET BY MOUTH EVERY DAY 30 tablet 11  . metFORMIN (GLUCOPHAGE) 1000 MG tablet Take 1,000 mg  by mouth 2 (two) times daily with a meal.    . metoprolol succinate (TOPROL XL) 25 MG 24 hr tablet Take 1 tablet (25 mg total) by mouth daily. 90 tablet 3  . saxagliptin HCl (ONGLYZA) 5 MG TABS tablet Take 5 mg by mouth daily.    Marland Kitchen warfarin (COUMADIN) 2.5 MG tablet TAKE 1 - 1.5 TABLETS BY MOUTH DAILY OR AS DIRECTED BY COUMADIN CLINIC. 45 tablet 2   No current facility-administered medications for this encounter.     Vitals:   09/10/17 0910  BP: (!) 146/88  Pulse: 100  SpO2: 93%  Weight: 183 lb 8 oz (83.2 kg)     PHYSICAL EXAM: General:  Sitting in chair No resp difficulty. HEENT: Normal Neck: Supple. JVP 9-10 Carotids 2+ bilat; no bruits. No thyromegaly or nodule noted. Cor: PMI nondisplaced. RRR, No M/G/R noted. Lungs: + basilar crackles Abdomen: Soft, non-tender, non-distended, no HSM. No bruits or masses. +BS  Extremities: No cyanosis, clubbing, rash, 1+ edema R>L. Healing wounds on RLE  Neuro: Alert & orientedx3, cranial nerves grossly intact. moves all 4 extremities w/o difficulty. Affect pleasant   NSR 99 with 1st AVB Personally reviewed  ASSESSMENT & PLAN:  1. 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  - Volume status up with NYHA III. CXR wet (viewed personally) - Will give lasix 80 IV x 1. Increase lasix 80/40 at home. Add back spiro 12.5 - Continue metoprolol XL 25 mg daily.  - Continue losartan 12.5 mg hs.  - BMET today - F/u Friday with echo   2. CAD S/P CABG x4: - No s/s of ischemia.  - Continue Lipitor and ASA  3. Bioprosthetic MVR:  - Stable. Continue warfarin   4. PVCs:  - Amio stopped per PVT. If she has recurring PVC's may need to restart. She has awareness of her PVC's and says they have improved since starting metoprolol (was previously on Coreg).   5. PAF - Maintaining NSR off amio.  - May be able to stop warfarin soon   6. RLE Full Thickness Wounds x2- surgical site: - Healing  well. Followed at PA/leg clinic at surgical office.   Total time spent 45 minutes. Over half that time spent discussing above.    Rachael Meres MD 9:47 AM

## 2017-09-11 ENCOUNTER — Encounter (HOSPITAL_COMMUNITY): Payer: Self-pay

## 2017-09-11 NOTE — Progress Notes (Signed)
Cardiac Individual Treatment Plan  Patient Details  Name: Rachael Davis MRN: [id number] Date of Birth: [birth year] Referring Provider:     CARDIAC REHAB PHASE II ORIENTATION from 07/26/2017 in [facility]  Referring Provider  Glori Bickers MD      Initial Encounter Date:    CARDIAC REHAB PHASE II ORIENTATION from 07/26/2017 in [facility]  Date  07/26/17  Referring Provider  Glori Bickers MD      Visit Diagnosis: 04/10/17 S/P CABG x 4  04/04/17 ST elevation myocardial infarction (STEMI), unspecified artery ([place])  Patient's Home Medications on Admission:  Current Outpatient Prescriptions:  .  acetaminophen (TYLENOL) 325 MG tablet, Take 2 tablets (650 mg total) by mouth every 6 (six) hours as needed for mild pain or headache., Disp: , Rfl:  .  aspirin EC 81 MG tablet, Take 81 mg by mouth daily., Disp: , Rfl:  .  atorvastatin (LIPITOR) 20 MG tablet, Take 1 tablet (20 mg total) by mouth daily at 6 PM., Disp: 30 tablet, Rfl: 1 .  dapagliflozin propanediol (FARXIGA) 10 MG TABS tablet, Take 10 mg by mouth daily., Disp: , Rfl:  .  furosemide (LASIX) 40 MG tablet, Take 80 mg in the AM and 40 mg in the PM, Disp: 90 tablet, Rfl: 3 .  glipiZIDE (GLUCOTROL XL) 10 MG 24 hr tablet, Take 1 tablet (10 mg total) by mouth daily with breakfast., Disp: , Rfl:  .  losartan (COZAAR) 25 MG tablet, Take 0.5 tablets (12.5 mg total) by mouth at bedtime., Disp: 15 tablet, Rfl: 3 .  magnesium oxide (MAG-OX) 400 (241.3 Mg) MG tablet, TAKE 1 TABLET BY MOUTH EVERY DAY, Disp: 30 tablet, Rfl: 11 .  metFORMIN (GLUCOPHAGE) 1000 MG tablet, Take 1,000 mg by mouth 2 (two) times daily with a meal., Disp: , Rfl:  .  metoprolol succinate (TOPROL XL) 25 MG 24 hr tablet, Take 1 tablet (25 mg total) by mouth daily., Disp: 90 tablet, Rfl: 3 .  saxagliptin HCl (ONGLYZA) 5 MG TABS tablet, Take 5 mg by mouth daily., Disp: , Rfl:  .  spironolactone (ALDACTONE) 25 MG  tablet, Take 0.5 tablets (12.5 mg total) by mouth daily., Disp: 45 tablet, Rfl: 3 .  warfarin (COUMADIN) 2.5 MG tablet, TAKE 1 - 1.5 TABLETS BY MOUTH DAILY OR AS DIRECTED BY COUMADIN CLINIC., Disp: 45 tablet, Rfl: 2  Past Medical History: Past Medical History:  Diagnosis Date  . Arrhythmia    Hx PAF, PVC's  . CHF (congestive heart failure) ([place])   . Coronary artery disease    04/04/2017 STEMI  . Diabetes ([place])   . Hyperlipidemia   . Hypertension   . New onset atrial fibrillation ([place]) 05/2017    Tobacco Use: History  Smoking Status  . Never Smoker  Smokeless Tobacco  . Never Used    Labs: Recent Review Flowsheet Data    Labs for ITP Cardiac and Pulmonary Rehab Latest Ref Rng & Units 04/12/2017 04/12/2017 04/13/2017 04/14/2017 04/15/2017   Cholestrol 0 - 200 mg/dL - - - - -   LDLCALC 0 - 99 mg/dL - - - - -   HDL >40 mg/dL - - - - -   Trlycerides <150 mg/dL - - - - -   Hemoglobin A1c 4.8 - 5.6 % - - - - -   PHART 7.350 - 7.450 7.354 7.376 7.366 - -   PCO2ART 32.0 - 48.0 mmHg 37.1 34.1 36.2 - -  HCO3 20.0 - 28.0 mmol/L 20.6 20.2 20.2 - -   TCO2 0 - 100 mmol/L _0 - -   ACIDBASEDEF 0.0 - 2.0 mmol/L 4.0(H) 5.0(H) 4.2(H) - -   O2SAT % 95.0 96.0 98.8 56.8 64.6      Capillary Blood Glucose: Lab Results  Component Value Date   GLUCAP 192 (H) 08/10/2017   GLUCAP 179 (H) 08/10/2017   GLUCAP 186 (H) 08/08/2017   GLUCAP 134 (H) 08/08/2017   GLUCAP 151 (H) 08/03/2017     Exercise Target Goals:    Exercise Program Goal: Individual exercise prescription set with THRR, safety & activity barriers. Participant demonstrates ability to understand and report RPE using BORG scale, to self-measure pulse accurately, and to acknowledge the importance of the exercise prescription.  Exercise Prescription Goal: Starting with aerobic activity 30 plus minutes a day, 3 days per week for initial exercise prescription. Provide home exercise prescription and guidelines that participant  acknowledges understanding prior to discharge.  Activity Barriers & Risk Stratification:     Activity Barriers & Cardiac Risk Stratification - 07/26/17 1350      Activity Barriers & Cardiac Risk Stratification   Activity Barriers Muscular Weakness;Deconditioning   Cardiac Risk Stratification High      6 Minute Walk:     6 Minute Walk    Row Name 07/26/17 1452         6 Minute Walk   Phase Initial     Distance 1343 feet     Walk Time 6 minutes     # of Rest Breaks 0     MPH 2.54     METS 3.14     RPE 13     VO2 Peak 10.99     Symptoms No     Resting HR 87 bpm     Resting BP 128/80     Max Ex. HR 113 bpm     Max Ex. BP 118/82     2 Minute Post BP 122/78        Oxygen Initial Assessment:   Oxygen Re-Evaluation:   Oxygen Discharge (Final Oxygen Re-Evaluation):   Initial Exercise Prescription:     Initial Exercise Prescription - 07/26/17 1400      Date of Initial Exercise RX and Referring Provider   Date 07/26/17   Referring Provider Bensimhon, Daniel MD     Recumbant Bike   Level 2   Minutes 10   METs 2     NuStep   Level 2   SPM 80   Minutes 10   METs 2     Track   Laps 8   Minutes 10   METs 2.74     Prescription Details   Frequency (times per week) 3   Duration Progress to 30 minutes of continuous aerobic without signs/symptoms of physical distress     Intensity   THRR 40-80% of Max Heartrate 63-126   Ratings of Perceived Exertion 11-13   Perceived Dyspnea 0-4     Progression   Progression Continue to progress workloads to maintain intensity without signs/symptoms of physical distress.     Resistance Training   Training Prescription Yes   Weight 2lbs   Reps 10-15      Perform Capillary Blood Glucose checks as needed.  Exercise Prescription Changes:     Exercise Prescription Changes    Row Name 08/03/17 1518 08/14/17 1500 08/22/17 0900 09/03/17 1600       Response to Exercise   Blood  Pressure (Admit) 102/60 106/69  110/68 126/88    Blood Pressure (Exercise) 138/68 138/70 140/80 146/84    Blood Pressure (Exit) 108/60 100/68 130/80 130/80    Heart Rate (Admit) 88 bpm 90 bpm 97 bpm 99 bpm    Heart Rate (Exercise) 118 bpm 117 bpm 126 bpm 130 bpm    Heart Rate (Exit) 94 bpm 95 bpm 106 bpm 109 bpm    Rating of Perceived Exertion (Exercise) _0 Symptoms non non none none    Comments Pt oriented to exercise equipment; pt responded well to exercise session.  Pt oriented to exercise equipment; pt responded well to exercise session.   -  -    Duration Continue with 30 min of aerobic exercise without signs/symptoms of physical distress. Continue with 30 min of aerobic exercise without signs/symptoms of physical distress. Continue with 30 min of aerobic exercise without signs/symptoms of physical distress. Continue with 30 min of aerobic exercise without signs/symptoms of physical distress.    Intensity THRR unchanged THRR unchanged THRR unchanged THRR unchanged      Progression   Progression Continue to progress workloads to maintain intensity without signs/symptoms of physical distress. Continue to progress workloads to maintain intensity without signs/symptoms of physical distress. Continue to progress workloads to maintain intensity without signs/symptoms of physical distress. Continue to progress workloads to maintain intensity without signs/symptoms of physical distress.    Average METs 2.3 2.9 2.8 2.9      Resistance Training   Training Prescription Yes Yes Yes No  Out early due to work schedule.    Weight 2lbs 2lbs 2lbs  -    Reps 10-15 10-15 10-15  -    Time 10 Minutes 10 Minutes 10 Minutes  -      Interval Training   Interval Training  -  - No No      Recumbant Bike   Level _1 2.5    Minutes _2 METs 2 2.7 2.6 2.4      NuStep   Level _3 SPM 80 100 100 100    Minutes _4 METs 2.1 3.1 3.2 3.4      Track   Laps _5 Minutes _6 METs 2.57 2.92 2.74 2.92      Home Exercise Plan   Plans to continue exercise at  -  - Home (comment) Home (comment)    Frequency  -  - Add 2 additional days to program exercise sessions. Add 2 additional days to program exercise sessions.       Exercise Comments:     Exercise Comments    Row Name 08/03/17 1521 08/22/17 0911 09/05/17 [id number]       Exercise Comments pt was oriented to exercise equipment. Pt responded well to exercise session; will continue to monitor Reviewed METs with patient. Pt is walking at least 10 minutes, 1-2 days/week at home. Reviewed METs, goals and home exercise guidelines with patient.        Exercise Goals and Review:     Exercise Goals    Row Name 07/26/17 1351             Exercise Goals   Increase Physical Activity Yes       Intervention Provide advice, education, support and counseling about physical activity/exercise needs.;Develop an individualized  exercise prescription for aerobic and resistive training based on initial evaluation findings, risk stratification, comorbidities and participant's personal goals.       Expected Outcomes Achievement of increased cardiorespiratory fitness and enhanced flexibility, muscular endurance and strength shown through measurements of functional capacity and personal statement of participant.       Increase Strength and Stamina Yes  return to work and being able to keep and play with grandkids       Intervention Provide advice, education, support and counseling about physical activity/exercise needs.;Develop an individualized exercise prescription for aerobic and resistive training based on initial evaluation findings, risk stratification, comorbidities and participant's personal goals.       Expected Outcomes Achievement of increased cardiorespiratory fitness and enhanced flexibility, muscular endurance and strength shown through measurements of functional capacity and personal statement of participant.           Exercise Goals Re-Evaluation :     Exercise Goals Re-Evaluation    Row Name 08/14/17 1555 09/05/17 0712           Exercise Goal Re-Evaluation   Exercise Goals Review Increase Physical Activity;Able to understand and use rate of perceived exertion (RPE) scale;Knowledge and understanding of Target Heart Rate Range (THRR);Understanding of Exercise Prescription;Increase Strength and Stamina;Able to check pulse independently Able to understand and use rate of perceived exertion (RPE) scale;Knowledge and understanding of Target Heart Rate Range (THRR);Understanding of Exercise Prescription;Increase Physical Activity      Comments Pt is progressing wel in cardiac rehab. Pt has increased SPM and MET level on Nustep machine and is exercising for 30 minutes continuously. Reviewed home exercise guidelines including THRR, RPE scale and endpoints for exercise.      Expected Outcomes Pt will continue to improve in cardiorespiratory fitness and overall functional capacity. Patient will walk 30 minutes 1 additional day at home.          Discharge Exercise Prescription (Final Exercise Prescription Changes):     Exercise Prescription Changes - 09/03/17 1600      Response to Exercise   Blood Pressure (Admit) 126/88   Blood Pressure (Exercise) 146/84   Blood Pressure (Exit) 130/80   Heart Rate (Admit) 99 bpm   Heart Rate (Exercise) 130 bpm   Heart Rate (Exit) 109 bpm   Rating of Perceived Exertion (Exercise) 13   Symptoms none   Duration Continue with 30 min of aerobic exercise without signs/symptoms of physical distress.   Intensity THRR unchanged     Progression   Progression Continue to progress workloads to maintain intensity without signs/symptoms of physical distress.   Average METs 2.9     Resistance Training   Training Prescription No  Out early due to work schedule.     Interval Training   Interval Training No     Recumbant Bike   Level 2.5   Minutes 10   METs 2.4      NuStep   Level 3   SPM 100   Minutes 10   METs 3.4     Track   Laps 11   Minutes 10   METs 2.92     Home Exercise Plan   Plans to continue exercise at Home (comment)   Frequency Add 2 additional days to program exercise sessions.      Nutrition:  Target Goals: Understanding of nutrition guidelines, daily intake of sodium <1564m, cholesterol <2054m calories 30% from fat and 7% or less from saturated fats, daily to have 5 or more servings of fruits  and vegetables.  Biometrics:     Pre Biometrics - 07/26/17 1634      Pre Biometrics   Height 5' 3" (1.6 m)   Weight 178 lb 9.2 oz (81 kg)   Hip Circumference 44 inches   BMI (Calculated) 31.64   % Body Fat 44.2 %   Grip Strength 30 kg   Flexibility 9.5 in   Single Leg Stand 11.4 seconds       Nutrition Therapy Plan and Nutrition Goals:     Nutrition Therapy & Goals - 07/26/17 1548      Nutrition Therapy   Diet Carb Modified, Therapeutic Lifestyle Changes     Personal Nutrition Goals   Nutrition Goal Pt to identify food quantities necessary to achieve weight loss of 6-24 lb (2.7-10.9 kg) at graduation from cardiac rehab.    Personal Goal #2 Improved blood glucose control as evidenced by pt's a1c decreasing towards 7 or less.      Intervention Plan   Intervention Prescribe, educate and counsel regarding individualized specific dietary modifications aiming towards targeted core components such as weight, hypertension, lipid management, diabetes, heart failure and other comorbidities.   Expected Outcomes Short Term Goal: Understand basic principles of dietary content, such as calories, fat, sodium, cholesterol and nutrients.;Long Term Goal: Adherence to prescribed nutrition plan.      Nutrition Discharge: Nutrition Scores:     Nutrition Assessments - 07/26/17 1548      MEDFICTS Scores   Pre Score 21      Nutrition Goals Re-Evaluation:   Nutrition Goals Re-Evaluation:   Nutrition Goals Discharge (Final  Nutrition Goals Re-Evaluation):   Psychosocial: Target Goals: Acknowledge presence or absence of significant depression and/or stress, maximize coping skills, provide positive support system. Participant is able to verbalize types and ability to use techniques and skills needed for reducing stress and depression.  Initial Review & Psychosocial Screening:     Initial Psych Review & Screening - 07/26/17 [place]? Yes     Barriers   Psychosocial barriers to participate in program There are no identifiable barriers or psychosocial needs.     Screening Interventions   Interventions Encouraged to exercise      Quality of Life Scores:     Quality of Life - 08/03/17 1654      Quality of Life Scores   GLOBAL Pre --  pt demonstrates health related anxiety. pt offered emotional support and reassurance. pt offered counseling with Jeanella Craze, declined stating she prefers to use her EAP.        PHQ-9: Recent Review Flowsheet Data    Depression screen [facility] 2/9 08/01/2017 08/01/2017   Decreased Interest - 0   Down, Depressed, Hopeless (No Data)  1   PHQ - 2 Score - 1     Interpretation of Total Score  Total Score Depression Severity:  1-4 = Minimal depression, 5-9 = Mild depression, 10-14 = Moderate depression, 15-19 = Moderately severe depression, 20-27 = Severe depression   Psychosocial Evaluation and Intervention:     Psychosocial Evaluation - 08/01/17 1624      Psychosocial Evaluation & Interventions   Interventions Encouraged to exercise with the program and follow exercise prescription   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 no psychosocial needs identified, no interventions necessary    Expected Outcomes pt will exhibit positive outlook with good coping skills.    Continue Psychosocial Services  No Follow up required  Psychosocial Re-Evaluation:     Psychosocial Re-Evaluation    Row Name 08/10/17 1655  08/31/17 1112 09/03/17 0744         Psychosocial Re-Evaluation   Current issues with None Identified None Identified None Identified     Comments no psychosocial needs identified, no interventions necessary.  no psychosocial needs identified, no interventions necessary.  no psychosocial needs identified, no interventions necessary.  pt is managing returning to work without difficulty.  pt has established ownership of her healthcare, daily  accessing  CHF s/s  and demonstrates appropriate MD notification.      Expected Outcomes pt will exhibit positive outlook with good coping skills.  pt will exhibit positive outlook with good coping skills.  pt will exhibit positive outlook with good coping skills.      Interventions Stress management education;Relaxation education;Encouraged to attend Cardiac Rehabilitation for the exercise Stress management education;Relaxation education;Encouraged to attend Cardiac Rehabilitation for the exercise Stress management education;Relaxation education;Encouraged to attend Cardiac Rehabilitation for the exercise     Continue Psychosocial Services  No Follow up required No Follow up required No Follow up required        Psychosocial Discharge (Final Psychosocial Re-Evaluation):     Psychosocial Re-Evaluation - 09/03/17 0744      Psychosocial Re-Evaluation   Current issues with None Identified   Comments no psychosocial needs identified, no interventions necessary.  pt is managing returning to work without difficulty.  pt has established ownership of her healthcare, daily  accessing  CHF s/s  and demonstrates appropriate MD notification.    Expected Outcomes pt will exhibit positive outlook with good coping skills.    Interventions Stress management education;Relaxation education;Encouraged to attend Cardiac Rehabilitation for the exercise   Continue Psychosocial Services  No Follow up required      Vocational Rehabilitation: Provide vocational rehab assistance  to qualifying candidates.   Vocational Rehab Evaluation & Intervention:     Vocational Rehab - 07/26/17 1632      Initial Vocational Rehab Evaluation & Intervention   Assessment shows need for Vocational Rehabilitation No      Education: Education Goals: Education classes will be provided on a weekly basis, covering required topics. Participant will state understanding/return demonstration of topics presented.  Learning Barriers/Preferences:     Learning Barriers/Preferences - 07/26/17 1452      Learning Barriers/Preferences   Learning Barriers Sight      Education Topics: Count Your Pulse:  -Group instruction provided by verbal instruction, demonstration, patient participation and written materials to support subject.  Instructors address importance of being able to find your pulse and how to count your pulse when at home without a heart monitor.  Patients get hands on experience counting their pulse with staff help and individually.   CARDIAC REHAB PHASE II EXERCISE from 08/31/2017 in [facility]  Date  08/10/17  Instruction Review Code  2- meets goals/outcomes      Heart Attack, Angina, and Risk Factor Modification:  -Group instruction provided by verbal instruction, video, and written materials to support subject.  Instructors address signs and symptoms of angina and heart attacks.    Also discuss risk factors for heart disease and how to make changes to improve heart health risk factors.   Functional Fitness:  -Group instruction provided by verbal instruction, demonstration, patient participation, and written materials to support subject.  Instructors address safety measures for doing things around the house.  Discuss how to get up and down off the  floor, how to pick things up properly, how to safely get out of a chair without assistance, and balance training.   Meditation and Mindfulness:  -Group instruction provided by verbal  instruction, patient participation, and written materials to support subject.  Instructor addresses importance of mindfulness and meditation practice to help reduce stress and improve awareness.  Instructor also leads participants through a meditation exercise.    Stretching for Flexibility and Mobility:  -Group instruction provided by verbal instruction, patient participation, and written materials to support subject.  Instructors lead participants through series of stretches that are designed to increase flexibility thus improving mobility.  These stretches are additional exercise for major muscle groups that are typically performed during regular warm up and cool down.   Hands Only CPR:  -Group verbal, video, and participation provides a basic overview of AHA guidelines for community CPR. Role-play of emergencies allow participants the opportunity to practice calling for help and chest compression technique with discussion of AED use.   Hypertension: -Group verbal and written instruction that provides a basic overview of hypertension including the most recent diagnostic guidelines, risk factor reduction with self-care instructions and medication management.    Nutrition I class: Heart Healthy Eating:  -Group instruction provided by PowerPoint slides, verbal discussion, and written materials to support subject matter. The instructor gives an explanation and review of the Therapeutic Lifestyle Changes diet recommendations, which includes a discussion on lipid goals, dietary fat, sodium, fiber, plant stanol/sterol esters, sugar, and the components of a well-balanced, healthy diet.   CARDIAC REHAB PHASE II EXERCISE from 08/31/2017 in [facility]  Date  08/31/17  Educator  RD  Instruction Review Code  Not applicable      Nutrition II class: Lifestyle Skills:  -Group instruction provided by PowerPoint slides, verbal discussion, and written materials to support  subject matter. The instructor gives an explanation and review of label reading, grocery shopping for heart health, heart healthy recipe modifications, and ways to make healthier choices when eating out.   CARDIAC REHAB PHASE II EXERCISE from 08/31/2017 in [facility]  Date  08/31/17  Educator  RD  Instruction Review Code  Not applicable      Diabetes Question & Answer:  -Group instruction provided by PowerPoint slides, verbal discussion, and written materials to support subject matter. The instructor gives an explanation and review of diabetes co-morbidities, pre- and post-prandial blood glucose goals, pre-exercise blood glucose goals, signs, symptoms, and treatment of hypoglycemia and hyperglycemia, and foot care basics.   CARDIAC REHAB PHASE II EXERCISE from 08/31/2017 in [facility]  Date  08/17/17  Educator  RD  Instruction Review Code  2- meets goals/outcomes      Diabetes Blitz:  -Group instruction provided by PowerPoint slides, verbal discussion, and written materials to support subject matter. The instructor gives an explanation and review of the physiology behind type 1 and type 2 diabetes, diabetes medications and rational behind using different medications, pre- and post-prandial blood glucose recommendations and Hemoglobin A1c goals, diabetes diet, and exercise including blood glucose guidelines for exercising safely.    CARDIAC REHAB PHASE II EXERCISE from 08/31/2017 in [facility]  Date  08/31/17  Educator  RD  Instruction Review Code  Not applicable      Portion Distortion:  -Group instruction provided by PowerPoint slides, verbal discussion, written materials, and food models to support subject matter. The instructor gives an explanation of serving  size versus portion size, changes in portions sizes over the last 20 years, and what consists of a serving from each food  group.   Stress Management:  -Group instruction provided by verbal instruction, video, and written materials to support subject matter.  Instructors review role of stress in heart disease and how to cope with stress positively.     CARDIAC REHAB PHASE II EXERCISE from 08/31/2017 in [place]  Date  08/01/17  Instruction Review Code  2- meets goals/outcomes      Exercising on Your Own:  -Group instruction provided by verbal instruction, power point, and written materials to support subject.  Instructors discuss benefits of exercise, components of exercise, frequency and intensity of exercise, and end points for exercise.  Also discuss use of nitroglycerin and activating EMS.  Review options of places to exercise outside of rehab.  Review guidelines for sex with heart disease.   Cardiac Drugs I:  -Group instruction provided by verbal instruction and written materials to support subject.  Instructor reviews cardiac drug classes: antiplatelets, anticoagulants, beta blockers, and statins.  Instructor discusses reasons, side effects, and lifestyle considerations for each drug class.   CARDIAC REHAB PHASE II EXERCISE from 08/31/2017 in [facility]  Date  08/15/17  Instruction Review Code  2- meets goals/outcomes      Cardiac Drugs II:  -Group instruction provided by verbal instruction and written materials to support subject.  Instructor reviews cardiac drug classes: angiotensin converting enzyme inhibitors (ACE-I), angiotensin II receptor blockers (ARBs), nitrates, and calcium channel blockers.  Instructor discusses reasons, side effects, and lifestyle considerations for each drug class.   Anatomy and Physiology of the Circulatory System:  Group verbal and written instruction and models provide basic cardiac anatomy and physiology, with the coronary electrical and arterial systems. Review of: AMI, Angina, Valve disease, Heart  Failure, Peripheral Artery Disease, Cardiac Arrhythmia, Pacemakers, and the ICD.   Other Education:  -Group or individual verbal, written, or video instructions that support the educational goals of the cardiac rehab program.   Knowledge Questionnaire Score:     Knowledge Questionnaire Score - 07/26/17 1457      Knowledge Questionnaire Score   Pre Score 21/24      Core Components/Risk Factors/Patient Goals at Admission:     Personal Goals and Risk Factors at Admission - 08/10/17 1651      Core Components/Risk Factors/Patient Goals on Admission    Weight Management --   Expected Outcomes --      Core Components/Risk Factors/Patient Goals Review:      Goals and Risk Factor Review    Row Name 08/10/17 1652 08/31/17 1111 09/03/17 0744 09/11/17 1517       Core Components/Risk Factors/Patient Goals Review   Personal Goals Review Weight Management/Obesity;Diabetes;Lipids;Hypertension;Other Weight Management/Obesity;Diabetes;Lipids;Hypertension;Other Weight Management/Obesity;Diabetes;Lipids;Hypertension;Other  -    Review pt with multiple CAD RF demonstrates eagerness to participate in CR activities. pt plans to return to work next week, however has made arrangements to continue CR participation.   pt with multiple CAD RF demonstrates eagerness to participate in CR activities. pt has been successful with managing CR participation and work schedule.   pt with multiple CAD RF demonstrates eagerness to participate in CR activities. pt has been successful with managing CR participation and work schedule.  pt reports she was able to complete walk aroud her block with taking 2 rest breaks. pt with multiple CAD RF demonstrates eagerness to participate in CR activities. pt  has been successful with managing CR participation and work schedule.  pt reports she was able to complete walk aroud her block with taking 2 rest breaks.    Expected Outcomes pt will participate in CR exercise, nutrition  and lifestyle modifications to reduce overall RF.  pt will participate in CR exercise, nutrition and lifestyle modifications to reduce overall RF.  pt will participate in CR exercise, nutrition and lifestyle modifications to reduce overall RF.  pt will participate in CR exercise, nutrition and lifestyle modifications to reduce overall RF.        Core Components/Risk Factors/Patient Goals at Discharge (Final Review):      Goals and Risk Factor Review - 09/11/17 1517      Core Components/Risk Factors/Patient Goals Review   Review pt with multiple CAD RF demonstrates eagerness to participate in CR activities. pt has been successful with managing CR participation and work schedule.  pt reports she was able to complete walk aroud her block with taking 2 rest breaks.   Expected Outcomes pt will participate in CR exercise, nutrition and lifestyle modifications to reduce overall RF.       ITP Comments:     ITP Comments    Row Name 07/26/17 1346 08/15/17 0814 09/11/17 1516       ITP Comments Medical Director, Dr. Fransico Him Medical Director, Dr. Fransico Him 30 day ITP review.  Pt with good participation and attendance.  No change in current regimen unless directed by Medical Director.          Comments:

## 2017-09-12 ENCOUNTER — Telehealth (HOSPITAL_COMMUNITY): Payer: Self-pay | Admitting: Cardiac Rehabilitation

## 2017-09-12 ENCOUNTER — Encounter (HOSPITAL_COMMUNITY): Payer: 59

## 2017-09-12 NOTE — Telephone Encounter (Signed)
pc to pt to assess symptoms.  Pt reports she feels much better with increased lasix and plans to return to rehab at next scheduled appointment.

## 2017-09-14 ENCOUNTER — Encounter (HOSPITAL_COMMUNITY): Payer: 59

## 2017-09-14 ENCOUNTER — Telehealth (HOSPITAL_COMMUNITY): Payer: Self-pay

## 2017-09-14 ENCOUNTER — Ambulatory Visit (HOSPITAL_COMMUNITY)
Admission: RE | Admit: 2017-09-14 | Discharge: 2017-09-14 | Disposition: A | Discharge status: Home / Self Care | Payer: 59 | Source: Ambulatory Visit | Attending: Family Medicine | Admitting: Family Medicine

## 2017-09-14 ENCOUNTER — Ambulatory Visit (HOSPITAL_BASED_OUTPATIENT_CLINIC_OR_DEPARTMENT_OTHER)
Admission: RE | Admit: 2017-09-14 | Discharge: 2017-09-14 | Disposition: A | Discharge status: Home / Self Care | Payer: 59 | Source: Ambulatory Visit | Attending: Cardiology | Admitting: Cardiology

## 2017-09-14 ENCOUNTER — Encounter (HOSPITAL_COMMUNITY)
Admission: RE | Admit: 2017-09-14 | Discharge: 2017-09-14 | Disposition: A | Discharge status: Home / Self Care | Payer: 59 | Source: Ambulatory Visit | Attending: Internal Medicine | Admitting: Internal Medicine

## 2017-09-14 VITALS — BP 120/74 | HR 87 | Wt 175.0 lb

## 2017-09-14 DIAGNOSIS — Z951 Presence of aortocoronary bypass graft: Secondary | ICD-10-CM

## 2017-09-14 DIAGNOSIS — I5043 Acute on chronic combined systolic (congestive) and diastolic (congestive) heart failure: Secondary | ICD-10-CM | POA: Diagnosis not present

## 2017-09-14 DIAGNOSIS — Z7984 Long term (current) use of oral hypoglycemic drugs: Secondary | ICD-10-CM | POA: Insufficient documentation

## 2017-09-14 DIAGNOSIS — Z952 Presence of prosthetic heart valve: Secondary | ICD-10-CM | POA: Diagnosis not present

## 2017-09-14 DIAGNOSIS — Z7982 Long term (current) use of aspirin: Secondary | ICD-10-CM | POA: Diagnosis not present

## 2017-09-14 DIAGNOSIS — I255 Ischemic cardiomyopathy: Secondary | ICD-10-CM | POA: Insufficient documentation

## 2017-09-14 DIAGNOSIS — I472 Ventricular tachycardia: Secondary | ICD-10-CM | POA: Insufficient documentation

## 2017-09-14 DIAGNOSIS — E785 Hyperlipidemia, unspecified: Secondary | ICD-10-CM | POA: Insufficient documentation

## 2017-09-14 DIAGNOSIS — I493 Ventricular premature depolarization: Secondary | ICD-10-CM | POA: Insufficient documentation

## 2017-09-14 DIAGNOSIS — Z7901 Long term (current) use of anticoagulants: Secondary | ICD-10-CM | POA: Insufficient documentation

## 2017-09-14 DIAGNOSIS — I480 Paroxysmal atrial fibrillation: Secondary | ICD-10-CM | POA: Diagnosis not present

## 2017-09-14 DIAGNOSIS — I4891 Unspecified atrial fibrillation: Secondary | ICD-10-CM | POA: Insufficient documentation

## 2017-09-14 DIAGNOSIS — Z953 Presence of xenogenic heart valve: Secondary | ICD-10-CM | POA: Insufficient documentation

## 2017-09-14 DIAGNOSIS — E119 Type 2 diabetes mellitus without complications: Secondary | ICD-10-CM | POA: Insufficient documentation

## 2017-09-14 DIAGNOSIS — I213 ST elevation (STEMI) myocardial infarction of unspecified site: Secondary | ICD-10-CM | POA: Diagnosis not present

## 2017-09-14 DIAGNOSIS — Z79899 Other long term (current) drug therapy: Secondary | ICD-10-CM | POA: Diagnosis not present

## 2017-09-14 DIAGNOSIS — I2510 Atherosclerotic heart disease of native coronary artery without angina pectoris: Secondary | ICD-10-CM | POA: Insufficient documentation

## 2017-09-14 DIAGNOSIS — I252 Old myocardial infarction: Secondary | ICD-10-CM | POA: Insufficient documentation

## 2017-09-14 DIAGNOSIS — Z9889 Other specified postprocedural states: Secondary | ICD-10-CM | POA: Diagnosis not present

## 2017-09-14 DIAGNOSIS — I110 Hypertensive heart disease with heart failure: Secondary | ICD-10-CM | POA: Diagnosis not present

## 2017-09-14 LAB — ECHOCARDIOGRAM COMPLETE
FS: 18 % — AB (ref 28–44)
IVS/LV PW RATIO, ED: 1
LA ID, A-P, ES: 42 mm
LA diam end sys: 42 mm
LA diam index: 2.26 cm/m2
LA vol A4C: 48.5 ml
LA vol index: 28.9 mL/m2
LA vol: 53.7 mL
LV PW d: 8.98 mm — AB (ref 0.6–1.1)
LV dias vol index: 54 mL/m2
LV dias vol: 100 mL (ref 46–106)
LV sys vol index: 34 mL/m2
LV sys vol: 63 mL — AB (ref 14–42)
LVOT MV VTI INDEX: 0.46 cm2/m2
LVOT MV VTI: 0.85
LVOT SV: 39 mL
LVOT VTI: 17.1 cm
LVOT area: 2.27 cm2
LVOT diameter: 17 mm
LVOT peak grad rest: 3 mmHg
LVOT peak vel: 84.9 cm/s
Lateral S' vel: 8.38 cm/s
MV Annulus VTI: 45.8 cm
MV M vel: 148
Mean grad: 10 mmHg
RV sys press: 23 mmHg
Reg peak vel: 226 cm/s
Simpson's disk: 36
Stroke v: 36 ml
TAPSE: 12.6 mm
TR max vel: 226 cm/s

## 2017-09-14 LAB — BASIC METABOLIC PANEL
Anion gap: 15 (ref 5–15)
BUN: 22 mg/dL — ABNORMAL HIGH (ref 6–20)
CO2: 22 mmol/L (ref 22–32)
Calcium: 9.5 mg/dL (ref 8.9–10.3)
Chloride: 106 mmol/L (ref 101–111)
Creatinine, Ser: 1.42 mg/dL — ABNORMAL HIGH (ref 0.44–1.00)
GFR calc Af Amer: 45 mL/min — ABNORMAL LOW (ref >60)
GFR calc non Af Amer: 39 mL/min — ABNORMAL LOW (ref >60)
Glucose, Bld: 61 mg/dL — ABNORMAL LOW (ref 65–99)
Potassium: 3.7 mmol/L (ref 3.5–5.1)
Sodium: 143 mmol/L (ref 135–145)

## 2017-09-14 MED ORDER — LOSARTAN POTASSIUM 25 MG PO TABS
25.0000 mg | ORAL_TABLET | Freq: Every day | ORAL | 6 refills | Status: DC
Start: 2017-09-14 — End: 2018-01-01

## 2017-09-14 NOTE — Progress Notes (Signed)
  Echocardiogram 2D Echocardiogram has been performed.  Delcie Roch 09/14/2017, 3:04 PM

## 2017-09-14 NOTE — Telephone Encounter (Signed)
CHF Clinic appointment reminder call placed to patient for upcoming post-hospital follow up.  LVMTCB to confirm apt.  Patient also reminded to take all medications as prescribed on the day of his/her appointment and to bring all medications to this appointment.  Advised to call our office for tardiness or cancellations/rescheduling needs.  .Bradley, Megan Genevea  

## 2017-09-14 NOTE — Progress Notes (Signed)
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 Diuresed with IV lasix and transitioned to lasix 40 mg twice a day. Also had lots of PVCs so started on amiodarone. Discharge weight was 177 pounds.   Returns today for HF follow up. Was seen last week in the clinic and given IV lasix. Weight down 8 pounds. Breathing much better. Denies SOB with walking around the house, denies orthopnea, PND. Taking all medications. Denies orthostatic symptoms.    ECHO 05/11/2017 - Left ventricle: The cavity size was moderately dilated. Wall thickness was increased in a pattern of mild LVH. Systolic function was moderately reduced. The estimated ejection fraction was in the range of 35% to 40%. Diffuse hypokinesis. The study is not technically sufficient to allow evaluation of LV diastolic function. - Mitral valve: Normal appearing bioprosthetic MV no perivalvular regurgitation. - Left atrium: The atrium was mildly dilated. - Atrial septum: No defect or patent foramen ovale was identified. - Pulmonary arteries: PA peak pressure: 35 mm Hg (S).  LHC 04/04/2017   Prox LAD lesion, 75 %stenosed.  Prox LAD to Mid LAD lesion, 65 %stenosed.  Mid LAD to Dist LAD lesion, 55 %stenosed.  Ramus lesion, 90 %stenosed.  Lat Ramus lesion, 50 %stenosed.  Prox Cx lesion, 100 %stenosed.  Prox RCA to Mid RCA lesion, 95 %stenosed.  There is moderate left ventricular systolic dysfunction.  LV end diastolic pressure is mildly elevated.  The left ventricular ejection fraction is 35-45% by visual estimate.  There is mild (2+) mitral regurgitation. 1. Severe 3 vessel obstructive CAD 2. Moderate LV dysfunction 3. Mildly elevated LV EDP ROS: All systems negative except as listed in HPI, PMH and Problem List.  SH:  Social History    Social History  . Marital status: Married    Spouse name: N/A  . Number of children: N/A  . Years of education: N/A   Occupational History  . Not on file.   Social History Main Topics  . Smoking status: Never Smoker  . Smokeless tobacco: Never Used  . Alcohol use No  . Drug use: No  . Sexual activity: Not on file   Other Topics Concern  . Not on file   Social History Narrative  . No narrative on file    FH:  Family History  Problem Relation Age of Onset  . Heart attack Father 5    Past Medical History:  Diagnosis Date  . Arrhythmia    Hx PAF, PVC's  . CHF (congestive heart failure) (HCC)   . Coronary artery disease    04/04/2017 STEMI  . Diabetes (HCC)   . Hyperlipidemia   . Hypertension   . New onset atrial fibrillation (HCC) 05/2017    Current Outpatient Prescriptions  Medication Sig Dispense Refill  . acetaminophen (TYLENOL) 325 MG tablet Take 2 tablets (650 mg total) by mouth every 6 (six) hours as needed for mild pain or headache.    Marland Kitchen aspirin EC 81 MG tablet Take 81 mg by mouth daily.    Marland Kitchen atorvastatin (LIPITOR) 20 MG tablet Take 1 tablet (20 mg total) by mouth daily at 6 PM. 30 tablet 1  . dapagliflozin propanediol (FARXIGA) 10 MG TABS tablet Take 10 mg by mouth daily.    . furosemide (LASIX) 40 MG tablet Take 80 mg  in the AM and 40 mg in the PM 90 tablet 3  . glipiZIDE (GLUCOTROL XL) 10 MG 24 hr tablet Take 1 tablet (10 mg total) by mouth daily with breakfast.    . losartan (COZAAR) 25 MG tablet Take 0.5 tablets (12.5 mg total) by mouth at bedtime. 15 tablet 3  . magnesium oxide (MAG-OX) 400 (241.3 Mg) MG tablet TAKE 1 TABLET BY MOUTH EVERY DAY 30 tablet 11  . metFORMIN (GLUCOPHAGE) 1000 MG tablet Take 1,000 mg by mouth 2 (two) times daily with a meal.    . metoprolol succinate (TOPROL XL) 25 MG 24 hr tablet Take 1 tablet (25 mg total) by mouth daily. 90 tablet 3  . saxagliptin HCl (ONGLYZA) 5 MG TABS tablet Take 5 mg by mouth daily.    Marland Kitchen  spironolactone (ALDACTONE) 25 MG tablet Take 0.5 tablets (12.5 mg total) by mouth daily. 45 tablet 3  . warfarin (COUMADIN) 2.5 MG tablet TAKE 1 - 1.5 TABLETS BY MOUTH DAILY OR AS DIRECTED BY COUMADIN CLINIC. 45 tablet 2   No current facility-administered medications for this encounter.     Vitals:   09/14/17 1519  BP: 120/74  Pulse: 87  SpO2: 100%  Weight: 175 lb (79.4 kg)     PHYSICAL EXAM: General: Well appearing. No resp difficulty. HEENT: Normal Neck: Supple. JVP 5-6. Carotids 2+ bilat; no bruits. No thyromegaly or nodule noted. Cor: PMI nondisplaced. RRR, No M/G/R noted. Sternal incision noted.  Lungs: CTAB, normal effort. Abdomen: Soft, non-tender, non-distended, no HSM. No bruits or masses. +BS  Extremities: No cyanosis, clubbing, rash, R and LLE no edema.  Neuro: Alert & orientedx3, cranial nerves grossly intact. moves all 4 extremities w/o difficulty. Affect pleasant      ASSESSMENT & PLAN:  1.Chronic Systolic Heart Failure: Ischemic cardiomyopathy, ECHO 05/24/2017 EF 35-40%.  - NYHA II - Volume stable, continue lasix 80 mg in the am and 40 mg in the pm. BMET today.  - Continue Spiro 12.5 mg hs - increase losartan to 25 mg hs.  - Continue metoprolol XL 25 mg daily.    2. CAD S/P CABG x4: - Denies chest pain - Continue statin and ASA.  3. Bioprosthetic MVR:  - Stable, continue warfarin.   4. PVCs:  - Amio stopped per PVT. If she has recurring PVC's may need to restart. She has awareness of her PVC's and says they have improved since starting metoprolol (was previously on Coreg).  - No change.   5. PAF - remains in NSR.    6. RLE Full Thickness Wounds x2- surgical site: - Healing well. Followed at [facility].   Follow up in 2 months.    Little Ishikawa NP-C 3:33 PM

## 2017-09-14 NOTE — Patient Instructions (Signed)
Routine lab work today. Will notify you of abnormal results, otherwise no news is good news!  INCREASE Losartan to 25 mg (1 whole tablet) once daily.  Follow up 2 months.  ____________________________________________________________ [place] Code: 8002  Take all medication as prescribed the day of your appointment. Bring all medications with you to your appointment.  Do the following things EVERYDAY: 1) Weigh yourself in the morning before breakfast. Write it down and keep it in a log. 2) Take your medicines as prescribed 3) Eat low salt foods-Limit salt (sodium) to 2000 mg per day.  4) Stay as active as you can everyday 5) Limit all fluids for the day to less than 2 liters

## 2017-09-17 ENCOUNTER — Encounter (HOSPITAL_COMMUNITY): Payer: 59

## 2017-09-17 ENCOUNTER — Encounter (HOSPITAL_COMMUNITY)
Admission: RE | Admit: 2017-09-17 | Discharge: 2017-09-17 | Disposition: A | Discharge status: Home / Self Care | Payer: 59 | Source: Ambulatory Visit | Attending: Internal Medicine | Admitting: Internal Medicine

## 2017-09-17 DIAGNOSIS — Z951 Presence of aortocoronary bypass graft: Secondary | ICD-10-CM

## 2017-09-17 DIAGNOSIS — I213 ST elevation (STEMI) myocardial infarction of unspecified site: Secondary | ICD-10-CM | POA: Diagnosis not present

## 2017-09-17 NOTE — Progress Notes (Signed)
Pt with significant wt loss.  Pt weighed 79.9kg    Weight verified and pt denies any changes in her medication.  Noted that pt received 80 mg of Lasix IV in the Heart failure clinic.  Pt returned on Friday with weight of 82.2kg.  Pt has lab visit today for INR.  Pt given rehab report for review. (pt seen on 10/12 by the Suzzette Righter NP.

## 2017-09-18 ENCOUNTER — Ambulatory Visit (INDEPENDENT_AMBULATORY_CARE_PROVIDER_SITE_OTHER): Payer: 59 | Admitting: Pharmacist

## 2017-09-18 ENCOUNTER — Encounter (HOSPITAL_COMMUNITY): Payer: Managed Care, Other (non HMO) | Admitting: Internal Medicine

## 2017-09-18 DIAGNOSIS — Z951 Presence of aortocoronary bypass graft: Secondary | ICD-10-CM

## 2017-09-18 DIAGNOSIS — Z7901 Long term (current) use of anticoagulants: Secondary | ICD-10-CM | POA: Diagnosis not present

## 2017-09-18 DIAGNOSIS — I4891 Unspecified atrial fibrillation: Secondary | ICD-10-CM

## 2017-09-18 LAB — POCT INR: INR: 1.5

## 2017-09-19 ENCOUNTER — Ambulatory Visit (INDEPENDENT_AMBULATORY_CARE_PROVIDER_SITE_OTHER): Payer: 59 | Admitting: Cardiothoracic Surgery

## 2017-09-19 ENCOUNTER — Encounter: Payer: Self-pay | Admitting: Cardiothoracic Surgery

## 2017-09-19 ENCOUNTER — Encounter (HOSPITAL_COMMUNITY): Payer: 59

## 2017-09-19 ENCOUNTER — Encounter (HOSPITAL_COMMUNITY)
Admission: RE | Admit: 2017-09-19 | Discharge: 2017-09-19 | Disposition: A | Discharge status: Home / Self Care | Payer: 59 | Source: Ambulatory Visit | Attending: Internal Medicine | Admitting: Internal Medicine

## 2017-09-19 VITALS — BP 106/71 | HR 88 | Ht 63.0 in | Wt 178.0 lb

## 2017-09-19 DIAGNOSIS — I213 ST elevation (STEMI) myocardial infarction of unspecified site: Secondary | ICD-10-CM

## 2017-09-19 DIAGNOSIS — Z951 Presence of aortocoronary bypass graft: Secondary | ICD-10-CM

## 2017-09-19 DIAGNOSIS — Z952 Presence of prosthetic heart valve: Secondary | ICD-10-CM | POA: Diagnosis not present

## 2017-09-19 NOTE — Progress Notes (Signed)
PCP is Deatra James, MD Referring Provider is [name], Peter M, MD  Chief Complaint  Patient presents with  . Routine Post Op    CABG x 4    HPI: The patient returns for final postop check of right leg wound at and a vein harvest site which developed a superficial infection and was opened, treated with oral antibiotics and packing and healed by secondary intention. The skin has now completely covered the wound and is healed. The patient will start using  Mederma skin ointment to promote skin health.  The patient had preop MI with EF of 30% and moderate to severe MR from tethering of the posterior leaflet from the inferior lateral MI as well as prolapse of the anterior leaflet. She had CABG 4 and mitral valve replacement with a 25 mm  bioprosthetic valve. The patient has made progress. She is attending outpatient cardiac rehabilitation. She is followed at the advanced heart failure clinic.  Her recent echocardiogram shows EF now 35-40 percent without MR. The transthoracic echo transvalvular mitral gradient is mildly elevated-10 mmHg however the intraoperative TEE with better on plane images of the mitral valve showed no significant gradient.  The patient still has dysesthetic pain over the sternal incision with sharp sticking sensation and exquisite sensitivity to touch by her garments  Last chest x-ray shows mild elevation of left hemidiaphragm with some left basilar atelectasis  Past Medical History:  Diagnosis Date  . Arrhythmia    Hx PAF, PVC's  . CHF (congestive heart failure) (HCC)   . Coronary artery disease    04/04/2017 STEMI  . Diabetes (HCC)   . Hyperlipidemia   . Hypertension   . New onset atrial fibrillation (HCC) 05/2017    Past Surgical History:  Procedure Laterality Date  . CORONARY ARTERY BYPASS GRAFT N/A 04/12/2017   Procedure: CORONARY ARTERY BYPASS GRAFTING (CABG), ON PUMP, TIMES FOUR, USING LEFT INTERNAL MAMMARY ARTERY AND ENDOSCOPICALLY HARVESTED RIGHT GREATER  SAPHENOUS VEIN;  Surgeon: Kerin Perna, MD;  Location: [facility] OR;  Service: Open Heart Surgery;  Laterality: N/A;  LIMA to LAD; SVG to OM; SVG to Ramus; SVG to Right  . IR THORACENTESIS ASP PLEURAL SPACE W/IMG GUIDE  05/25/2017  . LEFT HEART CATH AND CORONARY ANGIOGRAPHY N/A 04/04/2017   Procedure: Left Heart Cath and Coronary Angiography;  Surgeon: Peter M [name], MD;  Location: [facility] INVASIVE CV LAB;  Service: Cardiovascular;  Laterality: N/A;  . MITRAL VALVE REPAIR N/A 04/12/2017   Procedure: MITRAL VALVE REPLACEMENT (MVR);  Surgeon: Donata Clay, Theron Arista, MD;  Location: [facility] OR;  Service: Open Heart Surgery;  Laterality: N/A;  Using a 61mm Magna Ease Mitral Pericardial Bioprosthesis  . TEE WITHOUT CARDIOVERSION N/A 04/12/2017   Procedure: TRANSESOPHAGEAL ECHOCARDIOGRAM (TEE);  Surgeon: Donata Clay, Theron Arista, MD;  Location: [facility] OR;  Service: Open Heart Surgery;  Laterality: N/A;  . TUBAL LIGATION      Family History  Problem Relation Age of Onset  . Heart attack Father 14    Social History Social History  Substance Use Topics  . Smoking status: Never Smoker  . Smokeless tobacco: Never Used  . Alcohol use No    Current Outpatient Prescriptions  Medication Sig Dispense Refill  . acetaminophen (TYLENOL) 325 MG tablet Take 2 tablets (650 mg total) by mouth every 6 (six) hours as needed for mild pain or headache.    Marland Kitchen aspirin EC 81 MG tablet Take 81 mg by mouth daily.    Marland Kitchen atorvastatin (LIPITOR) 20 MG tablet  Take 1 tablet (20 mg total) by mouth daily at 6 PM. 30 tablet 1  . dapagliflozin propanediol (FARXIGA) 10 MG TABS tablet Take 10 mg by mouth daily.    . furosemide (LASIX) 40 MG tablet Take 80 mg in the AM and 40 mg in the PM 90 tablet 3  . glipiZIDE (GLUCOTROL XL) 10 MG 24 hr tablet Take 1 tablet (10 mg total) by mouth daily with breakfast.    . losartan (COZAAR) 25 MG tablet Take 1 tablet (25 mg total) by mouth at bedtime. 30 tablet 6  . magnesium oxide (MAG-OX) 400 (241.3 Mg) MG tablet TAKE 1 TABLET  BY MOUTH EVERY DAY 30 tablet 11  . metFORMIN (GLUCOPHAGE) 1000 MG tablet Take 1,000 mg by mouth 2 (two) times daily with a meal.    . metoprolol succinate (TOPROL XL) 25 MG 24 hr tablet Take 1 tablet (25 mg total) by mouth daily. 90 tablet 3  . saxagliptin HCl (ONGLYZA) 5 MG TABS tablet Take 5 mg by mouth daily.    Marland Kitchen. spironolactone (ALDACTONE) 25 MG tablet Take 0.5 tablets (12.5 mg total) by mouth daily. 45 tablet 3  . warfarin (COUMADIN) 2.5 MG tablet TAKE 1 - 1.5 TABLETS BY MOUTH DAILY OR AS DIRECTED BY COUMADIN CLINIC. 45 tablet 2   No current facility-administered medications for this visit.     Allergies  Allergen Reactions  . Keflex [Cephalexin] Rash    Skin on feet peeled.  . Lisinopril Cough    Review of Systems  Her weight is stable but she has to watch her salt and fluid intake Atypical neurogenic type chest wall discomfort also atypical muscular-type pain of her elbows and sometimes her neck. No bleeding complications from Coumadin No evidence recurrent atrial fibrillation  BP 106/71   Pulse 88   Ht 5\' 3"  (1.6 m)   Wt 178 lb (80.7 kg)   SpO2 97%   BMI 31.53 kg/m  Physical Exam      Exam    General- alert and comfortable   Lungs- clear without rales, wheezes   Cor- regular rate and rhythm, no murmur , gallop   Abdomen- soft, non-tender   Extremities - warm, non-tender, minimal edema   Neuro- oriented, appropriate, no focal weakness   Diagnostic Tests: Lungs clear on chest x-ray except for mild left hemidiaphragm elevation  Impression: Good recovery after combined CABG mitral valve replacement for ischemic cardiomyopathy, ischemic MR. She needs continued heart failure medications and follow-up. At 5 months postop she no longer has to follow sternal precautions with her lifting and other activities.  Plan: Return as needed for any surgical issues. I discussed with her the importance of antibiotic prophylaxis with amoxicillin one hour before any dental work  that involves more than a simple dental hygiene cleaning to prevent prosthetic endocarditis  Mikey BussingPeter Van Trigt III, MD [facility] [phone]

## 2017-09-21 ENCOUNTER — Encounter (HOSPITAL_COMMUNITY)
Admission: RE | Admit: 2017-09-21 | Discharge: 2017-09-21 | Disposition: A | Discharge status: Home / Self Care | Payer: 59 | Source: Ambulatory Visit | Attending: Internal Medicine | Admitting: Internal Medicine

## 2017-09-21 ENCOUNTER — Encounter (HOSPITAL_COMMUNITY): Payer: 59

## 2017-09-21 DIAGNOSIS — I213 ST elevation (STEMI) myocardial infarction of unspecified site: Secondary | ICD-10-CM | POA: Diagnosis not present

## 2017-09-21 DIAGNOSIS — Z951 Presence of aortocoronary bypass graft: Secondary | ICD-10-CM

## 2017-09-24 ENCOUNTER — Encounter (HOSPITAL_COMMUNITY): Payer: 59

## 2017-09-26 ENCOUNTER — Telehealth (HOSPITAL_COMMUNITY): Payer: Self-pay | Admitting: Cardiac Rehabilitation

## 2017-09-26 ENCOUNTER — Encounter (HOSPITAL_COMMUNITY): Payer: 59

## 2017-09-26 ENCOUNTER — Encounter (HOSPITAL_COMMUNITY)
Admission: RE | Admit: 2017-09-26 | Discharge: 2017-09-26 | Disposition: A | Discharge status: Home / Self Care | Payer: 59 | Source: Ambulatory Visit | Attending: Internal Medicine | Admitting: Internal Medicine

## 2017-09-26 DIAGNOSIS — I213 ST elevation (STEMI) myocardial infarction of unspecified site: Secondary | ICD-10-CM | POA: Diagnosis not present

## 2017-09-26 DIAGNOSIS — Z951 Presence of aortocoronary bypass graft: Secondary | ICD-10-CM

## 2017-09-26 NOTE — Telephone Encounter (Signed)
-----   Message from Dolores Patty, MD sent at 09/14/2017 11:02 AM EDT ----- Regarding: RE: cardiac rehab   Yep she can return. Thank you!  ----- Message ----- From: Robyne Peers, RN Sent: 09/12/2017   5:36 PM To: Dolores Patty, MD, Chelsea Aus, RN Subject: cardiac rehab                                  Dear Dr. Gala Romney,  Pt was seen on 09/10/2017 with CHF exacerbation.  Symptoms successfully managed with IV lasix.  Pt reports she feels much better.  Thank you for seeing her so quickly!  Is it ok for her to return to cardiac rehab?  Thank you, Deveron Furlong, RN, BSN Cardiac Pulmonary Rehab

## 2017-09-28 ENCOUNTER — Encounter (HOSPITAL_COMMUNITY): Payer: 59

## 2017-09-28 ENCOUNTER — Encounter (HOSPITAL_COMMUNITY)
Admission: RE | Admit: 2017-09-28 | Discharge: 2017-09-28 | Disposition: A | Discharge status: Home / Self Care | Payer: 59 | Source: Ambulatory Visit | Attending: Internal Medicine | Admitting: Internal Medicine

## 2017-09-28 ENCOUNTER — Ambulatory Visit (INDEPENDENT_AMBULATORY_CARE_PROVIDER_SITE_OTHER): Payer: 59 | Admitting: Pharmacist Clinician (PhC)/ Clinical Pharmacy Specialist

## 2017-09-28 DIAGNOSIS — Z951 Presence of aortocoronary bypass graft: Secondary | ICD-10-CM

## 2017-09-28 DIAGNOSIS — I4891 Unspecified atrial fibrillation: Secondary | ICD-10-CM

## 2017-09-28 DIAGNOSIS — Z7901 Long term (current) use of anticoagulants: Secondary | ICD-10-CM

## 2017-09-28 DIAGNOSIS — I213 ST elevation (STEMI) myocardial infarction of unspecified site: Secondary | ICD-10-CM | POA: Diagnosis not present

## 2017-09-28 LAB — POCT INR: INR: 1.4

## 2017-10-01 ENCOUNTER — Encounter (HOSPITAL_COMMUNITY): Payer: 59

## 2017-10-01 ENCOUNTER — Encounter (HOSPITAL_COMMUNITY)
Admission: RE | Admit: 2017-10-01 | Discharge: 2017-10-01 | Disposition: A | Discharge status: Home / Self Care | Payer: 59 | Source: Ambulatory Visit | Attending: Internal Medicine | Admitting: Internal Medicine

## 2017-10-01 DIAGNOSIS — I213 ST elevation (STEMI) myocardial infarction of unspecified site: Secondary | ICD-10-CM | POA: Diagnosis not present

## 2017-10-01 DIAGNOSIS — Z951 Presence of aortocoronary bypass graft: Secondary | ICD-10-CM

## 2017-10-02 ENCOUNTER — Telehealth (HOSPITAL_COMMUNITY): Payer: Self-pay | Admitting: *Deleted

## 2017-10-02 DIAGNOSIS — I5022 Chronic systolic (congestive) heart failure: Secondary | ICD-10-CM

## 2017-10-02 NOTE — Telephone Encounter (Signed)
Advanced Heart Failure Triage Encounter  Patient Name: Rachael Davis  Date of Call: 10/02/17  Problem: PVCs  Patient called and left message on triage line stating "my heart is fluttering more than normal." No chest pain or SOB just wanted [place] to be aware.   Plan:  I called patient back to let her know that I would talk to Dr. Gala Romney tomorrow when he is back in clinic but had to leave her message.  Will discuss with Dr. Gala Romney and get back with the patient with his response.   Georgina Peer, RN

## 2017-10-03 ENCOUNTER — Encounter (HOSPITAL_COMMUNITY): Payer: 59

## 2017-10-03 ENCOUNTER — Encounter (HOSPITAL_COMMUNITY)
Admission: RE | Admit: 2017-10-03 | Discharge: 2017-10-03 | Disposition: A | Discharge status: Home / Self Care | Payer: 59 | Source: Ambulatory Visit | Attending: Internal Medicine | Admitting: Internal Medicine

## 2017-10-03 DIAGNOSIS — Z951 Presence of aortocoronary bypass graft: Secondary | ICD-10-CM

## 2017-10-03 DIAGNOSIS — I213 ST elevation (STEMI) myocardial infarction of unspecified site: Secondary | ICD-10-CM | POA: Diagnosis not present

## 2017-10-03 NOTE — Telephone Encounter (Signed)
Spoke with Dr. Gala Romney and has ordered for patient to wear 48 hour holter monitor.  Order placed and patient is aware.

## 2017-10-05 ENCOUNTER — Encounter (HOSPITAL_COMMUNITY): Payer: 59

## 2017-10-05 ENCOUNTER — Encounter (HOSPITAL_COMMUNITY)
Admission: RE | Admit: 2017-10-05 | Discharge: 2017-10-05 | Disposition: A | Discharge status: Home / Self Care | Payer: 59 | Source: Ambulatory Visit | Attending: Internal Medicine | Admitting: Internal Medicine

## 2017-10-05 DIAGNOSIS — Z951 Presence of aortocoronary bypass graft: Secondary | ICD-10-CM | POA: Insufficient documentation

## 2017-10-05 DIAGNOSIS — I213 ST elevation (STEMI) myocardial infarction of unspecified site: Secondary | ICD-10-CM | POA: Diagnosis not present

## 2017-10-05 NOTE — Progress Notes (Signed)
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  acetaminophen (TYLENOL) 325 MG tablet, Take 2 tablets (650 mg total) by mouth every 6 (six) hours as needed for mild pain or headache., Disp: , Rfl:  .  aspirin EC 81 MG tablet, Take 81 mg by mouth daily., Disp: , Rfl:  .  atorvastatin (LIPITOR) 20 MG tablet, Take 1 tablet (20 mg total) by mouth daily at 6 PM., Disp: 30 tablet, Rfl: 1 .  dapagliflozin propanediol (FARXIGA) 10 MG TABS tablet, Take 10 mg by mouth daily., Disp: , Rfl:  .  furosemide (LASIX) 40 MG tablet, Take 80 mg in the AM and 40 mg in the PM, Disp: 90 tablet, Rfl: 3 .  glipiZIDE (GLUCOTROL XL) 10 MG 24 hr tablet, Take 1 tablet (10 mg total) by mouth daily with breakfast., Disp: , Rfl:  .  losartan (COZAAR) 25 MG tablet, Take 1 tablet (25 mg total) by mouth at bedtime., Disp: 30 tablet, Rfl: 6 .  magnesium oxide (MAG-OX) 400 (241.3 Mg) MG tablet, TAKE 1 TABLET BY MOUTH EVERY DAY, Disp: 30 tablet, Rfl: 11 .  metFORMIN (GLUCOPHAGE) 1000 MG tablet, Take 1,000 mg by mouth 2 (two) times daily with a meal., Disp: , Rfl:  .  metoprolol succinate (TOPROL XL) 25 MG 24 hr tablet, Take 1 tablet (25 mg total) by mouth daily., Disp: 90 tablet, Rfl: 3 .  saxagliptin HCl (ONGLYZA) 5 MG TABS tablet, Take 5 mg by mouth daily., Disp: , Rfl:  .  spironolactone (ALDACTONE) 25 MG  tablet, Take 0.5 tablets (12.5 mg total) by mouth daily., Disp: 45 tablet, Rfl: 3 .  warfarin (COUMADIN) 2.5 MG tablet, TAKE 1 - 1.5 TABLETS BY MOUTH DAILY OR AS DIRECTED BY COUMADIN CLINIC., Disp: 45 tablet, Rfl: 2  Past Medical History: Past Medical History:  Diagnosis Date  . Arrhythmia    Hx PAF, PVC's  . CHF (congestive heart failure) ([place])   . Coronary artery disease    04/04/2017 STEMI  . Diabetes ([place])   . Hyperlipidemia   . Hypertension   . New onset atrial fibrillation ([place]) 05/2017    Tobacco Use: History  Smoking Status  . Never Smoker  Smokeless Tobacco  . Never Used    Labs: Recent Review Flowsheet Data    Labs for ITP Cardiac and Pulmonary Rehab Latest Ref Rng & Units 04/12/2017 04/12/2017 04/13/2017 04/14/2017 04/15/2017   Cholestrol 0 - 200 mg/dL - - - - -   LDLCALC 0 - 99 mg/dL - - - - -   HDL >40 mg/dL - - - - -   Trlycerides <150 mg/dL - - - - -   Hemoglobin A1c 4.8 - 5.6 % - - - - -   PHART 7.350 - 7.450 7.354 7.376 7.366 - -   PCO2ART 32.0 - 48.0 mmHg 37.1 34.1 36.2 - -  HCO3 20.0 - 28.0 mmol/L 20.6 20.2 20.2 - -   TCO2 0 - 100 mmol/L _0 - -   ACIDBASEDEF 0.0 - 2.0 mmol/L 4.0(H) 5.0(H) 4.2(H) - -   O2SAT % 95.0 96.0 98.8 56.8 64.6      Capillary Blood Glucose: Lab Results  Component Value Date   GLUCAP 192 (H) 08/10/2017   GLUCAP 179 (H) 08/10/2017   GLUCAP 186 (H) 08/08/2017   GLUCAP 134 (H) 08/08/2017   GLUCAP 151 (H) 08/03/2017     Exercise Target Goals:    Exercise Program Goal: Individual exercise prescription set with THRR, safety & activity barriers. Participant demonstrates ability to understand and report RPE using BORG scale, to self-measure pulse accurately, and to acknowledge the importance of the exercise prescription.  Exercise Prescription Goal: Starting with aerobic activity 30 plus minutes a day, 3 days per week for initial exercise prescription. Provide home exercise prescription and guidelines that participant  acknowledges understanding prior to discharge.  Activity Barriers & Risk Stratification:     Activity Barriers & Cardiac Risk Stratification - 07/26/17 1350      Activity Barriers & Cardiac Risk Stratification   Activity Barriers Muscular Weakness;Deconditioning   Cardiac Risk Stratification High      6 Minute Walk:     6 Minute Walk    Row Name 07/26/17 1452         6 Minute Walk   Phase Initial     Distance 1343 feet     Walk Time 6 minutes     # of Rest Breaks 0     MPH 2.54     METS 3.14     RPE 13     VO2 Peak 10.99     Symptoms No     Resting HR 87 bpm     Resting BP 128/80     Max Ex. HR 113 bpm     Max Ex. BP 118/82     2 Minute Post BP 122/78        Oxygen Initial Assessment:   Oxygen Re-Evaluation:   Oxygen Discharge (Final Oxygen Re-Evaluation):   Initial Exercise Prescription:     Initial Exercise Prescription - 07/26/17 1400      Date of Initial Exercise RX and Referring Provider   Date 07/26/17   Referring Provider Bensimhon, Daniel MD     Recumbant Bike   Level 2   Minutes 10   METs 2     NuStep   Level 2   SPM 80   Minutes 10   METs 2     Track   Laps 8   Minutes 10   METs 2.74     Prescription Details   Frequency (times per week) 3   Duration Progress to 30 minutes of continuous aerobic without signs/symptoms of physical distress     Intensity   THRR 40-80% of Max Heartrate 63-126   Ratings of Perceived Exertion 11-13   Perceived Dyspnea 0-4     Progression   Progression Continue to progress workloads to maintain intensity without signs/symptoms of physical distress.     Resistance Training   Training Prescription Yes   Weight 2lbs   Reps 10-15      Perform Capillary Blood Glucose checks as needed.  Exercise Prescription Changes:     Exercise Prescription Changes    Row Name 08/03/17 1518 08/14/17 1500 08/22/17 0900 09/03/17 1600 09/17/17 0800     Response to Exercise   Blood  Pressure (Admit) 102/60  106/69 110/68 126/88 110/72   Blood Pressure (Exercise) 138/68 138/70 140/80 146/84 130/60   Blood Pressure (Exit) 108/60 100/68 130/80 130/80 114/70   Heart Rate (Admit) 88 bpm 90 bpm 97 bpm 99 bpm 89 bpm   Heart Rate (Exercise) 118 bpm 117 bpm 126 bpm 130 bpm 107 bpm   Heart Rate (Exit) 94 bpm 95 bpm 106 bpm 109 bpm 95 bpm   Rating of Perceived Exertion (Exercise) _0 Symptoms non non none none none   Comments Pt oriented to exercise equipment; pt responded well to exercise session.  Pt oriented to exercise equipment; pt responded well to exercise session.   -  -  -   Duration Continue with 30 min of aerobic exercise without signs/symptoms of physical distress. Continue with 30 min of aerobic exercise without signs/symptoms of physical distress. Continue with 30 min of aerobic exercise without signs/symptoms of physical distress. Continue with 30 min of aerobic exercise without signs/symptoms of physical distress. Continue with 30 min of aerobic exercise without signs/symptoms of physical distress.   Intensity _1      Progression   Progression Continue to progress workloads to maintain intensity without signs/symptoms of physical distress. Continue to progress workloads to maintain intensity without signs/symptoms of physical distress. Continue to progress workloads to maintain intensity without signs/symptoms of physical distress. Continue to progress workloads to maintain intensity without signs/symptoms of physical distress. Continue to progress workloads to maintain intensity without signs/symptoms of physical distress.   Average METs 2.3 2.9 2.8 2.9 2.8     Resistance Training   Training Prescription Yes Yes Yes No  Out early due to work schedule. No  Out early due to work schedule.   Weight 2lbs 2lbs 2lbs  -  -   Reps 10-15 10-15 10-15  -  -   Time 10 Minutes 10 Minutes 10 Minutes  -  -     Interval  Training   Interval Training  -  - No No No     Recumbant Bike   Level _2 2.5 2.5   Minutes _3 METs 2 2.7 2.6 2.4 2.6     NuStep   Level _4 SPM 80 100 100 100 100   Minutes _5 METs 2.1 3.1 3.2 3.4 3.2     Track   Laps _6 Minutes _7 METs 2.57 2.92 2.74 2.92 2.57     Home Exercise Plan   Plans to continue exercise at  -  - Home (comment) Home (comment) Home (comment)   Frequency  -  - Add 2 additional days to program exercise sessions. Add 2 additional days to program exercise sessions. Add 2 additional days to program exercise sessions.   [place] Name 10/01/17 0900             Response to Exercise   Blood Pressure (Admit) 102/68       Blood Pressure (Exercise) 112/78       Blood Pressure (Exit) 104/68       Heart Rate (Admit) 87 bpm       Heart Rate (Exercise) 116 bpm       Heart Rate (Exit) 87 bpm       Rating of Perceived Exertion (  Exercise) 13       Symptoms none       Duration Continue with 30 min of aerobic exercise without signs/symptoms of physical distress.       Intensity THRR unchanged         Progression   Progression Continue to progress workloads to maintain intensity without signs/symptoms of physical distress.       Average METs 3         Resistance Training   Training Prescription No  Out early due to work schedule.         Interval Training   Interval Training No         Recumbant Bike   Level 2.5       Minutes 10       METs 2.5         NuStep   Level 4       SPM 100       Minutes 10       METs 3.8         Track   Laps 10       Minutes 10       METs 2.74         Home Exercise Plan   Plans to continue exercise at Home (comment)       Frequency Add 2 additional days to program exercise sessions.          Exercise Comments:     Exercise Comments    Row Name 08/03/17 1521 08/22/17 0911 09/05/17 0712 09/17/17 0805 10/01/17 2376   Exercise Comments pt was oriented to  exercise equipment. Pt responded well to exercise session; will continue to monitor Reviewed METs with patient. Pt is walking at least 10 minutes, 1-2 days/week at home. Reviewed METs, goals and home exercise guidelines with patient. Reviewed METs with patient. Reviewed goals with patient.      Exercise Goals and Review:     Exercise Goals    Row Name 07/26/17 1351             Exercise Goals   Increase Physical Activity Yes       Intervention Provide advice, education, support and counseling about physical activity/exercise needs.;Develop an individualized exercise prescription for aerobic and resistive training based on initial evaluation findings, risk stratification, comorbidities and participant's personal goals.       Expected Outcomes Achievement of increased cardiorespiratory fitness and enhanced flexibility, muscular endurance and strength shown through measurements of functional capacity and personal statement of participant.       Increase Strength and Stamina Yes  return to work and being able to keep and play with grandkids       Intervention Provide advice, education, support and counseling about physical activity/exercise needs.;Develop an individualized exercise prescription for aerobic and resistive training based on initial evaluation findings, risk stratification, comorbidities and participant's personal goals.       Expected Outcomes Achievement of increased cardiorespiratory fitness and enhanced flexibility, muscular endurance and strength shown through measurements of functional capacity and personal statement of participant.          Exercise Goals Re-Evaluation :     Exercise Goals Re-Evaluation    Row Name 08/14/17 1555 09/05/17 0712 10/01/17 0651         Exercise Goal Re-Evaluation   Exercise Goals Review Increase Physical Activity;Able to understand and use rate of perceived exertion (RPE) scale;Knowledge and understanding of Target Heart Rate Range  (THRR);Understanding of Exercise Prescription;Increase  Strength and Stamina;Able to check pulse independently Able to understand and use rate of perceived exertion (RPE) scale;Knowledge and understanding of Target Heart Rate Range (THRR);Understanding of Exercise Prescription;Increase Physical Activity Increase Physical Activity;Increase Strength and Stamina     Comments Pt is progressing wel in cardiac rehab. Pt has increased SPM and MET level on Nustep machine and is exercising for 30 minutes continuously. Reviewed home exercise guidelines including THRR, RPE scale and endpoints for exercise. Patient has been able to return to work. Pt is walking about 10 minutes when she walks but plans to gradually increase duration to 30 minutes.      Expected Outcomes Pt will continue to improve in cardiorespiratory fitness and overall functional capacity. Patient will walk 30 minutes 1 additional day at home. Patient will walk 30 minutes 1 additional day at home.         Discharge Exercise Prescription (Final Exercise Prescription Changes):     Exercise Prescription Changes - 10/01/17 0900      Response to Exercise   Blood Pressure (Admit) 102/68   Blood Pressure (Exercise) 112/78   Blood Pressure (Exit) 104/68   Heart Rate (Admit) 87 bpm   Heart Rate (Exercise) 116 bpm   Heart Rate (Exit) 87 bpm   Rating of Perceived Exertion (Exercise) 13   Symptoms none   Duration Continue with 30 min of aerobic exercise without signs/symptoms of physical distress.   Intensity THRR unchanged     Progression   Progression Continue to progress workloads to maintain intensity without signs/symptoms of physical distress.   Average METs 3     Resistance Training   Training Prescription No  Out early due to work schedule.     Interval Training   Interval Training No     Recumbant Bike   Level 2.5   Minutes 10   METs 2.5     NuStep   Level 4   SPM 100   Minutes 10   METs 3.8     Track   Laps 10    Minutes 10   METs 2.74     Home Exercise Plan   Plans to continue exercise at Home (comment)   Frequency Add 2 additional days to program exercise sessions.      Nutrition:  Target Goals: Understanding of nutrition guidelines, daily intake of sodium <1549m, cholesterol <2063m calories 30% from fat and 7% or less from saturated fats, daily to have 5 or more servings of fruits and vegetables.  Biometrics:     Pre Biometrics - 07/26/17 1634      Pre Biometrics   Height 5' 3" (1.6 m)   Weight 178 lb 9.2 oz (81 kg)   Hip Circumference 44 inches   BMI (Calculated) 31.64   % Body Fat 44.2 %   Grip Strength 30 kg   Flexibility 9.5 in   Single Leg Stand 11.4 seconds       Nutrition Therapy Plan and Nutrition Goals:     Nutrition Therapy & Goals - 07/26/17 1548      Nutrition Therapy   Diet Carb Modified, Therapeutic Lifestyle Changes     Personal Nutrition Goals   Nutrition Goal Pt to identify food quantities necessary to achieve weight loss of 6-24 lb (2.7-10.9 kg) at graduation from cardiac rehab.    Personal Goal #2 Improved blood glucose control as evidenced by pt's a1c decreasing towards 7 or less.      Intervention Plan   Intervention Prescribe, educate and counsel  regarding individualized specific dietary modifications aiming towards targeted core components such as weight, hypertension, lipid management, diabetes, heart failure and other comorbidities.   Expected Outcomes Short Term Goal: Understand basic principles of dietary content, such as calories, fat, sodium, cholesterol and nutrients.;Long Term Goal: Adherence to prescribed nutrition plan.      Nutrition Discharge: Nutrition Scores:     Nutrition Assessments - 07/26/17 1548      MEDFICTS Scores   Pre Score 21      Nutrition Goals Re-Evaluation:   Nutrition Goals Re-Evaluation:   Nutrition Goals Discharge (Final Nutrition Goals Re-Evaluation):   Psychosocial: Target Goals: Acknowledge  presence or absence of significant depression and/or stress, maximize coping skills, provide positive support system. Participant is able to verbalize types and ability to use techniques and skills needed for reducing stress and depression.  Initial Review & Psychosocial Screening:     Initial Psych Review & Screening - 07/26/17 [place]? Yes     Barriers   Psychosocial barriers to participate in program There are no identifiable barriers or psychosocial needs.     Screening Interventions   Interventions Encouraged to exercise      Quality of Life Scores:     Quality of Life - 08/03/17 1654      Quality of Life Scores   GLOBAL Pre --  pt demonstrates health related anxiety. pt offered emotional support and reassurance. pt offered counseling with Jeanella Craze, declined stating she prefers to use her EAP.        PHQ-9: Recent Review Flowsheet Data    Depression screen [facility] 2/9 08/01/2017 08/01/2017   Decreased Interest - 0   Down, Depressed, Hopeless (No Data)  1   PHQ - 2 Score - 1     Interpretation of Total Score  Total Score Depression Severity:  1-4 = Minimal depression, 5-9 = Mild depression, 10-14 = Moderate depression, 15-19 = Moderately severe depression, 20-27 = Severe depression   Psychosocial Evaluation and Intervention:     Psychosocial Evaluation - 08/01/17 1624      Psychosocial Evaluation & Interventions   Interventions Encouraged to exercise with the program and follow exercise prescription   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 no psychosocial needs identified, no interventions necessary    Expected Outcomes pt will exhibit positive outlook with good coping skills.    Continue Psychosocial Services  No Follow up required      Psychosocial Re-Evaluation:     Psychosocial Re-Evaluation    [place] Name 08/10/17 1655 08/31/17 1112 09/03/17 0744 09/26/17 0743       Psychosocial Re-Evaluation    Current issues with None Identified None Identified None Identified  -    Comments no psychosocial needs identified, no interventions necessary.  no psychosocial needs identified, no interventions necessary.  no psychosocial needs identified, no interventions necessary.  pt is managing returning to work without difficulty.  pt has established ownership of her healthcare, daily  accessing  CHF s/s  and demonstrates appropriate MD notification.  no psychosocial needs identified, no interventions necessary.  pt is managing returning to work without difficulty.  pt has established ownership of her healthcare, daily  accessing  CHF s/s  and demonstrates appropriate MD notification.     Expected Outcomes pt will exhibit positive outlook with good coping skills.  pt will exhibit positive outlook with good coping skills.  pt will exhibit positive outlook with good coping  skills.  pt will exhibit positive outlook with good coping skills.     Interventions Stress management education;Relaxation education;Encouraged to attend Cardiac Rehabilitation for the exercise Stress management education;Relaxation education;Encouraged to attend Cardiac Rehabilitation for the exercise Stress management education;Relaxation education;Encouraged to attend Cardiac Rehabilitation for the exercise Stress management education;Relaxation education;Encouraged to attend Cardiac Rehabilitation for the exercise    Continue Psychosocial Services  No Follow up required No Follow up required No Follow up required No Follow up required       Psychosocial Discharge (Final Psychosocial Re-Evaluation):     Psychosocial Re-Evaluation - 09/26/17 0743      Psychosocial Re-Evaluation   Comments no psychosocial needs identified, no interventions necessary.  pt is managing returning to work without difficulty.  pt has established ownership of her healthcare, daily  accessing  CHF s/s  and demonstrates appropriate MD notification.    Expected  Outcomes pt will exhibit positive outlook with good coping skills.    Interventions Stress management education;Relaxation education;Encouraged to attend Cardiac Rehabilitation for the exercise   Continue Psychosocial Services  No Follow up required      Vocational Rehabilitation: Provide vocational rehab assistance to qualifying candidates.   Vocational Rehab Evaluation & Intervention:     Vocational Rehab - 07/26/17 1632      Initial Vocational Rehab Evaluation & Intervention   Assessment shows need for Vocational Rehabilitation No      Education: Education Goals: Education classes will be provided on a weekly basis, covering required topics. Participant will state understanding/return demonstration of topics presented.  Learning Barriers/Preferences:     Learning Barriers/Preferences - 07/26/17 1452      Learning Barriers/Preferences   Learning Barriers Sight      Education Topics: Count Your Pulse:  -Group instruction provided by verbal instruction, demonstration, patient participation and written materials to support subject.  Instructors address importance of being able to find your pulse and how to count your pulse when at home without a heart monitor.  Patients get hands on experience counting their pulse with staff help and individually.   CARDIAC REHAB PHASE II EXERCISE from 09/14/2017 in [place]  Date  08/10/17  Instruction Review Code  2- meets goals/outcomes      Heart Attack, Angina, and Risk Factor Modification:  -Group instruction provided by verbal instruction, video, and written materials to support subject.  Instructors address signs and symptoms of angina and heart attacks.    Also discuss risk factors for heart disease and how to make changes to improve heart health risk factors.   Functional Fitness:  -Group instruction provided by verbal instruction, demonstration, patient participation, and written materials to  support subject.  Instructors address safety measures for doing things around the house.  Discuss how to get up and down off the floor, how to pick things up properly, how to safely get out of a chair without assistance, and balance training.   Meditation and Mindfulness:  -Group instruction provided by verbal instruction, patient participation, and written materials to support subject.  Instructor addresses importance of mindfulness and meditation practice to help reduce stress and improve awareness.  Instructor also leads participants through a meditation exercise.    Stretching for Flexibility and Mobility:  -Group instruction provided by verbal instruction, patient participation, and written materials to support subject.  Instructors lead participants through series of stretches that are designed to increase flexibility thus improving mobility.  These stretches are additional exercise for major muscle groups that are  typically performed during regular warm up and cool down.   Hands Only CPR:  -Group verbal, video, and participation provides a basic overview of AHA guidelines for community CPR. Role-play of emergencies allow participants the opportunity to practice calling for help and chest compression technique with discussion of AED use.   Hypertension: -Group verbal and written instruction that provides a basic overview of hypertension including the most recent diagnostic guidelines, risk factor reduction with self-care instructions and medication management.    Nutrition I class: Heart Healthy Eating:  -Group instruction provided by PowerPoint slides, verbal discussion, and written materials to support subject matter. The instructor gives an explanation and review of the Therapeutic Lifestyle Changes diet recommendations, which includes a discussion on lipid goals, dietary fat, sodium, fiber, plant stanol/sterol esters, sugar, and the components of a well-balanced, healthy diet.    CARDIAC REHAB PHASE II EXERCISE from 09/14/2017 in [place]  Date  08/31/17  Educator  RD  Instruction Review Code  Not applicable      Nutrition II class: Lifestyle Skills:  -Group instruction provided by PowerPoint slides, verbal discussion, and written materials to support subject matter. The instructor gives an explanation and review of label reading, grocery shopping for heart health, heart healthy recipe modifications, and ways to make healthier choices when eating out.   CARDIAC REHAB PHASE II EXERCISE from 09/14/2017 in [place]  Date  08/31/17  Educator  RD  Instruction Review Code  Not applicable      Diabetes Question & Answer:  -Group instruction provided by PowerPoint slides, verbal discussion, and written materials to support subject matter. The instructor gives an explanation and review of diabetes co-morbidities, pre- and post-prandial blood glucose goals, pre-exercise blood glucose goals, signs, symptoms, and treatment of hypoglycemia and hyperglycemia, and foot care basics.   CARDIAC REHAB PHASE II EXERCISE from 09/14/2017 in [place]  Date  09/14/17  Educator  RD  Instruction Review Code  2- meets goals/outcomes      Diabetes Blitz:  -Group instruction provided by PowerPoint slides, verbal discussion, and written materials to support subject matter. The instructor gives an explanation and review of the physiology behind type 1 and type 2 diabetes, diabetes medications and rational behind using different medications, pre- and post-prandial blood glucose recommendations and Hemoglobin A1c goals, diabetes diet, and exercise including blood glucose guidelines for exercising safely.    CARDIAC REHAB PHASE II EXERCISE from 09/14/2017 in [place]  Date  08/31/17  Educator  RD  Instruction Review Code  Not applicable      Portion  Distortion:  -Group instruction provided by PowerPoint slides, verbal discussion, written materials, and food models to support subject matter. The instructor gives an explanation of serving size versus portion size, changes in portions sizes over the last 20 years, and what consists of a serving from each food group.   Stress Management:  -Group instruction provided by verbal instruction, video, and written materials to support subject matter.  Instructors review role of stress in heart disease and how to cope with stress positively.     CARDIAC REHAB PHASE II EXERCISE from 09/14/2017 in [place]  Date  08/01/17  Instruction Review Code  2- meets goals/outcomes      Exercising on Your Own:  -Group instruction provided by verbal instruction, power point, and written materials to support subject.  Instructors discuss benefits of  exercise, components of exercise, frequency and intensity of exercise, and end points for exercise.  Also discuss use of nitroglycerin and activating EMS.  Review options of places to exercise outside of rehab.  Review guidelines for sex with heart disease.   Cardiac Drugs I:  -Group instruction provided by verbal instruction and written materials to support subject.  Instructor reviews cardiac drug classes: antiplatelets, anticoagulants, beta blockers, and statins.  Instructor discusses reasons, side effects, and lifestyle considerations for each drug class.   CARDIAC REHAB PHASE II EXERCISE from 09/14/2017 in [place]  Date  08/15/17  Instruction Review Code  2- meets goals/outcomes      Cardiac Drugs II:  -Group instruction provided by verbal instruction and written materials to support subject.  Instructor reviews cardiac drug classes: angiotensin converting enzyme inhibitors (ACE-I), angiotensin II receptor blockers (ARBs), nitrates, and calcium channel blockers.  Instructor discusses reasons,  side effects, and lifestyle considerations for each drug class.   Anatomy and Physiology of the Circulatory System:  Group verbal and written instruction and models provide basic cardiac anatomy and physiology, with the coronary electrical and arterial systems. Review of: AMI, Angina, Valve disease, Heart Failure, Peripheral Artery Disease, Cardiac Arrhythmia, Pacemakers, and the ICD.   Other Education:  -Group or individual verbal, written, or video instructions that support the educational goals of the cardiac rehab program.   Knowledge Questionnaire Score:     Knowledge Questionnaire Score - 07/26/17 1457      Knowledge Questionnaire Score   Pre Score 21/24      Core Components/Risk Factors/Patient Goals at Admission:     Personal Goals and Risk Factors at Admission - 08/10/17 1651      Core Components/Risk Factors/Patient Goals on Admission    Weight Management --   Expected Outcomes --      Core Components/Risk Factors/Patient Goals Review:      Goals and Risk Factor Review    Row Name 08/10/17 1652 08/31/17 1111 09/03/17 0744 09/11/17 1517 09/26/17 0742     Core Components/Risk Factors/Patient Goals Review   Personal Goals Review Weight Management/Obesity;Diabetes;Lipids;Hypertension;Other Weight Management/Obesity;Diabetes;Lipids;Hypertension;Other Weight Management/Obesity;Diabetes;Lipids;Hypertension;Other  - Weight Management/Obesity;Diabetes;Lipids;Hypertension;Other   Review pt with multiple CAD RF demonstrates eagerness to participate in CR activities. pt plans to return to work next week, however has made arrangements to continue CR participation.   pt with multiple CAD RF demonstrates eagerness to participate in CR activities. pt has been successful with managing CR participation and work schedule.   pt with multiple CAD RF demonstrates eagerness to participate in CR activities. pt has been successful with managing CR participation and work schedule.  pt reports  she was able to complete walk aroud her block with taking 2 rest breaks. pt with multiple CAD RF demonstrates eagerness to participate in CR activities. pt has been successful with managing CR participation and work schedule.  pt reports she was able to complete walk aroud her block with taking 2 rest breaks. pt with multiple CAD RF demonstrates eagerness to participate in CR activities. pt has been successful with managing CR participation and work schedule.  pt reports improved heart failure symptoms   Expected Outcomes pt will participate in CR exercise, nutrition and lifestyle modifications to reduce overall RF.  pt will participate in CR exercise, nutrition and lifestyle modifications to reduce overall RF.  pt will participate in CR exercise, nutrition and lifestyle modifications to reduce overall RF.  pt will participate in CR exercise, nutrition and lifestyle modifications  to reduce overall RF.  pt will participate in CR exercise, nutrition and lifestyle modifications to reduce overall RF.       Core Components/Risk Factors/Patient Goals at Discharge (Final Review):      Goals and Risk Factor Review - 09/26/17 0742      Core Components/Risk Factors/Patient Goals Review   Personal Goals Review Weight Management/Obesity;Diabetes;Lipids;Hypertension;Other   Review pt with multiple CAD RF demonstrates eagerness to participate in CR activities. pt has been successful with managing CR participation and work schedule.  pt reports improved heart failure symptoms   Expected Outcomes pt will participate in CR exercise, nutrition and lifestyle modifications to reduce overall RF.       ITP Comments:     ITP Comments    Row Name 07/26/17 1346 08/15/17 0814 09/11/17 1516       ITP Comments Medical Director, Dr. Fransico Him Medical Director, Dr. Fransico Him 30 day ITP review.  Pt with good participation and attendance.  No change in current regimen unless directed by Medical Director.           Comments:

## 2017-10-08 ENCOUNTER — Encounter (HOSPITAL_COMMUNITY): Payer: 59

## 2017-10-08 ENCOUNTER — Other Ambulatory Visit (HOSPITAL_COMMUNITY): Payer: Self-pay | Admitting: Internal Medicine

## 2017-10-08 ENCOUNTER — Encounter (HOSPITAL_COMMUNITY)
Admission: RE | Admit: 2017-10-08 | Discharge: 2017-10-08 | Disposition: A | Discharge status: Home / Self Care | Payer: 59 | Source: Ambulatory Visit | Attending: Internal Medicine | Admitting: Internal Medicine

## 2017-10-08 ENCOUNTER — Ambulatory Visit (INDEPENDENT_AMBULATORY_CARE_PROVIDER_SITE_OTHER): Payer: 59

## 2017-10-08 DIAGNOSIS — R002 Palpitations: Secondary | ICD-10-CM | POA: Diagnosis not present

## 2017-10-08 DIAGNOSIS — I213 ST elevation (STEMI) myocardial infarction of unspecified site: Secondary | ICD-10-CM | POA: Diagnosis not present

## 2017-10-08 DIAGNOSIS — Z951 Presence of aortocoronary bypass graft: Secondary | ICD-10-CM

## 2017-10-08 DIAGNOSIS — I4891 Unspecified atrial fibrillation: Secondary | ICD-10-CM | POA: Diagnosis not present

## 2017-10-08 DIAGNOSIS — I5022 Chronic systolic (congestive) heart failure: Secondary | ICD-10-CM

## 2017-10-10 ENCOUNTER — Encounter (HOSPITAL_COMMUNITY): Payer: 59

## 2017-10-12 ENCOUNTER — Ambulatory Visit (INDEPENDENT_AMBULATORY_CARE_PROVIDER_SITE_OTHER): Payer: 59 | Admitting: Pharmacist

## 2017-10-12 ENCOUNTER — Encounter (HOSPITAL_COMMUNITY)
Admission: RE | Admit: 2017-10-12 | Discharge: 2017-10-12 | Disposition: A | Discharge status: Home / Self Care | Payer: 59 | Source: Ambulatory Visit | Attending: Internal Medicine | Admitting: Internal Medicine

## 2017-10-12 ENCOUNTER — Encounter (HOSPITAL_COMMUNITY): Payer: 59

## 2017-10-12 DIAGNOSIS — Z7901 Long term (current) use of anticoagulants: Secondary | ICD-10-CM

## 2017-10-12 DIAGNOSIS — I4891 Unspecified atrial fibrillation: Secondary | ICD-10-CM

## 2017-10-12 DIAGNOSIS — I213 ST elevation (STEMI) myocardial infarction of unspecified site: Secondary | ICD-10-CM | POA: Diagnosis not present

## 2017-10-12 DIAGNOSIS — Z951 Presence of aortocoronary bypass graft: Secondary | ICD-10-CM

## 2017-10-12 LAB — POCT INR: INR: 1.5

## 2017-10-12 NOTE — Progress Notes (Signed)
Take 2 tablets today Friday November/9,  Then increase dose to 1.5 tablets daily.  Repeat INR in 2 weeks

## 2017-10-15 ENCOUNTER — Encounter (HOSPITAL_COMMUNITY)
Admission: RE | Admit: 2017-10-15 | Discharge: 2017-10-15 | Disposition: A | Discharge status: Home / Self Care | Payer: 59 | Source: Ambulatory Visit | Attending: Internal Medicine | Admitting: Internal Medicine

## 2017-10-15 ENCOUNTER — Encounter (HOSPITAL_COMMUNITY): Payer: 59

## 2017-10-15 DIAGNOSIS — I213 ST elevation (STEMI) myocardial infarction of unspecified site: Secondary | ICD-10-CM | POA: Diagnosis not present

## 2017-10-15 DIAGNOSIS — Z951 Presence of aortocoronary bypass graft: Secondary | ICD-10-CM

## 2017-10-17 ENCOUNTER — Encounter (HOSPITAL_COMMUNITY): Payer: 59

## 2017-10-17 ENCOUNTER — Encounter (HOSPITAL_COMMUNITY)
Admission: RE | Admit: 2017-10-17 | Discharge: 2017-10-17 | Disposition: A | Discharge status: Home / Self Care | Payer: 59 | Source: Ambulatory Visit | Attending: Internal Medicine | Admitting: Internal Medicine

## 2017-10-17 DIAGNOSIS — Z951 Presence of aortocoronary bypass graft: Secondary | ICD-10-CM

## 2017-10-17 DIAGNOSIS — I213 ST elevation (STEMI) myocardial infarction of unspecified site: Secondary | ICD-10-CM

## 2017-10-19 ENCOUNTER — Encounter (HOSPITAL_COMMUNITY)
Admission: RE | Admit: 2017-10-19 | Discharge: 2017-10-19 | Disposition: A | Discharge status: Home / Self Care | Payer: 59 | Source: Ambulatory Visit | Attending: Internal Medicine | Admitting: Internal Medicine

## 2017-10-19 ENCOUNTER — Encounter (HOSPITAL_COMMUNITY): Payer: 59

## 2017-10-19 DIAGNOSIS — Z951 Presence of aortocoronary bypass graft: Secondary | ICD-10-CM

## 2017-10-19 DIAGNOSIS — I213 ST elevation (STEMI) myocardial infarction of unspecified site: Secondary | ICD-10-CM

## 2017-10-21 ENCOUNTER — Other Ambulatory Visit: Payer: Self-pay | Admitting: Cardiovascular Disease

## 2017-10-22 ENCOUNTER — Encounter (HOSPITAL_COMMUNITY)
Admission: RE | Admit: 2017-10-22 | Discharge: 2017-10-22 | Disposition: A | Discharge status: Home / Self Care | Payer: 59 | Source: Ambulatory Visit | Attending: Internal Medicine | Admitting: Internal Medicine

## 2017-10-22 ENCOUNTER — Encounter (HOSPITAL_COMMUNITY): Payer: 59

## 2017-10-22 VITALS — Ht 63.0 in | Wt 177.2 lb

## 2017-10-22 DIAGNOSIS — I213 ST elevation (STEMI) myocardial infarction of unspecified site: Secondary | ICD-10-CM | POA: Diagnosis not present

## 2017-10-22 DIAGNOSIS — Z951 Presence of aortocoronary bypass graft: Secondary | ICD-10-CM

## 2017-10-22 NOTE — Telephone Encounter (Signed)
Please review for refill, Thanks !  

## 2017-10-24 ENCOUNTER — Encounter (HOSPITAL_COMMUNITY): Payer: 59

## 2017-10-24 ENCOUNTER — Encounter (HOSPITAL_COMMUNITY)
Admission: RE | Admit: 2017-10-24 | Discharge: 2017-10-24 | Disposition: A | Discharge status: Home / Self Care | Payer: 59 | Source: Ambulatory Visit | Attending: Internal Medicine | Admitting: Internal Medicine

## 2017-10-24 DIAGNOSIS — I213 ST elevation (STEMI) myocardial infarction of unspecified site: Secondary | ICD-10-CM

## 2017-10-24 DIAGNOSIS — Z951 Presence of aortocoronary bypass graft: Secondary | ICD-10-CM

## 2017-10-26 ENCOUNTER — Encounter (HOSPITAL_COMMUNITY): Payer: 59

## 2017-10-29 ENCOUNTER — Encounter (HOSPITAL_COMMUNITY): Payer: 59

## 2017-10-29 ENCOUNTER — Encounter (HOSPITAL_COMMUNITY)
Admission: RE | Admit: 2017-10-29 | Discharge: 2017-10-29 | Disposition: A | Discharge status: Home / Self Care | Payer: 59 | Source: Ambulatory Visit | Attending: Internal Medicine | Admitting: Internal Medicine

## 2017-10-29 DIAGNOSIS — I213 ST elevation (STEMI) myocardial infarction of unspecified site: Secondary | ICD-10-CM | POA: Diagnosis not present

## 2017-10-29 DIAGNOSIS — Z951 Presence of aortocoronary bypass graft: Secondary | ICD-10-CM

## 2017-10-31 ENCOUNTER — Encounter (HOSPITAL_COMMUNITY): Payer: 59

## 2017-10-31 ENCOUNTER — Encounter (HOSPITAL_COMMUNITY)
Admission: RE | Admit: 2017-10-31 | Discharge: 2017-10-31 | Disposition: A | Discharge status: Home / Self Care | Payer: 59 | Source: Ambulatory Visit | Attending: Internal Medicine | Admitting: Internal Medicine

## 2017-10-31 DIAGNOSIS — I213 ST elevation (STEMI) myocardial infarction of unspecified site: Secondary | ICD-10-CM | POA: Diagnosis not present

## 2017-10-31 DIAGNOSIS — Z951 Presence of aortocoronary bypass graft: Secondary | ICD-10-CM

## 2017-11-01 ENCOUNTER — Ambulatory Visit (INDEPENDENT_AMBULATORY_CARE_PROVIDER_SITE_OTHER): Payer: 59 | Admitting: Pharmacist

## 2017-11-01 DIAGNOSIS — Z951 Presence of aortocoronary bypass graft: Secondary | ICD-10-CM | POA: Diagnosis not present

## 2017-11-01 DIAGNOSIS — I4891 Unspecified atrial fibrillation: Secondary | ICD-10-CM

## 2017-11-01 DIAGNOSIS — Z7901 Long term (current) use of anticoagulants: Secondary | ICD-10-CM | POA: Diagnosis not present

## 2017-11-01 LAB — POCT INR: INR: 1.8

## 2017-11-02 ENCOUNTER — Encounter (HOSPITAL_COMMUNITY)
Admission: RE | Admit: 2017-11-02 | Discharge: 2017-11-02 | Disposition: A | Discharge status: Home / Self Care | Payer: 59 | Source: Ambulatory Visit | Attending: Internal Medicine | Admitting: Internal Medicine

## 2017-11-02 DIAGNOSIS — I213 ST elevation (STEMI) myocardial infarction of unspecified site: Secondary | ICD-10-CM

## 2017-11-02 DIAGNOSIS — Z951 Presence of aortocoronary bypass graft: Secondary | ICD-10-CM

## 2017-11-02 NOTE — Progress Notes (Signed)
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  acetaminophen (TYLENOL) 325 MG tablet, Take 2 tablets (650 mg total) by mouth every 6 (six) hours as needed for mild pain or headache., Disp: , Rfl:  .  aspirin EC 81 MG tablet, Take 81 mg by mouth daily., Disp: , Rfl:  .  atorvastatin (LIPITOR) 20 MG tablet, TAKE 1 TABLET (20 MG TOTAL) BY MOUTH DAILY AT 6 PM., Disp: 30 tablet, Rfl: 1 .  dapagliflozin propanediol (FARXIGA) 10 MG TABS tablet, Take 10 mg by mouth daily., Disp: , Rfl:  .  furosemide (LASIX) 40 MG tablet, Take 80 mg in the AM and 40 mg in the PM, Disp: 90 tablet, Rfl: 3 .  glipiZIDE (GLUCOTROL XL) 10 MG 24 hr tablet, Take 1 tablet (10 mg total) by mouth daily with breakfast., Disp: , Rfl:  .  losartan (COZAAR) 25 MG tablet, Take 1 tablet (25 mg total) by mouth at bedtime., Disp: 30 tablet, Rfl: 6 .  magnesium oxide (MAG-OX) 400 (241.3 Mg) MG tablet, TAKE 1 TABLET BY MOUTH EVERY DAY, Disp: 30 tablet, Rfl: 11 .  metFORMIN (GLUCOPHAGE) 1000 MG tablet, Take 1,000 mg by mouth 2 (two) times daily with a meal., Disp: , Rfl:  .  metoprolol succinate (TOPROL XL) 25 MG 24 hr tablet, Take 1 tablet (25 mg total) by mouth daily., Disp: 90 tablet, Rfl: 3 .  saxagliptin HCl (ONGLYZA) 5 MG TABS tablet, Take 5 mg by mouth daily., Disp: , Rfl:  .  spironolactone (ALDACTONE) 25 MG tablet,  Take 0.5 tablets (12.5 mg total) by mouth daily., Disp: 45 tablet, Rfl: 3 .  warfarin (COUMADIN) 2.5 MG tablet, TAKE 1 - 1.5 TABLETS BY MOUTH DAILY OR AS DIRECTED BY COUMADIN CLINIC., Disp: 45 tablet, Rfl: 2  Past Medical History: Past Medical History:  Diagnosis Date  . Arrhythmia    Hx PAF, PVC's  . CHF (congestive heart failure) ([place])   . Coronary artery disease    04/04/2017 STEMI  . Diabetes ([place])   . Hyperlipidemia   . Hypertension   . New onset atrial fibrillation ([place]) 05/2017    Tobacco Use: Social History   Tobacco Use  Smoking Status Never Smoker  Smokeless Tobacco Never Used    Labs: Recent Review Flowsheet Data    Labs for ITP Cardiac and Pulmonary Rehab Latest Ref Rng & Units 04/12/2017 04/12/2017 04/13/2017 04/14/2017 04/15/2017   Cholestrol 0 - 200 mg/dL - - - - -   LDLCALC 0 - 99 mg/dL - - - - -   HDL >40 mg/dL - - - - -   Trlycerides <150 mg/dL - - - - -   Hemoglobin A1c 4.8 - 5.6 % - - - - -   PHART 7.350 - 7.450 7.354 7.376 7.366 - -   PCO2ART 32.0 - 48.0 mmHg 37.1 34.1 36.2 - -  HCO3 20.0 - 28.0 mmol/L 20.6 20.2 20.2 - -   TCO2 0 - 100 mmol/L '22 21 21 '$ - -   ACIDBASEDEF 0.0 - 2.0 mmol/L 4.0(H) 5.0(H) 4.2(H) - -   O2SAT % 95.0 96.0 98.8 56.8 64.6      Capillary Blood Glucose: Lab Results  Component Value Date   GLUCAP 192 (H) 08/10/2017   GLUCAP 179 (H) 08/10/2017   GLUCAP 186 (H) 08/08/2017   GLUCAP 134 (H) 08/08/2017   GLUCAP 151 (H) 08/03/2017     Exercise Target Goals:    Exercise Program Goal: Individual exercise prescription set with THRR, safety & activity barriers. Participant demonstrates ability to understand and report RPE using BORG scale, to self-measure pulse accurately, and to acknowledge the importance of the exercise prescription.  Exercise Prescription Goal: Starting with aerobic activity 30 plus minutes a day, 3 days per week for initial exercise prescription. Provide home exercise prescription and guidelines that participant  acknowledges understanding prior to discharge.  Activity Barriers & Risk Stratification: Activity Barriers & Cardiac Risk Stratification - 07/26/17 1350      Activity Barriers & Cardiac Risk Stratification   Activity Barriers  Muscular Weakness;Deconditioning    Cardiac Risk Stratification  High       6 Minute Walk: 6 Minute Walk    Row Name 07/26/17 1452 10/22/17 1514       6 Minute Walk   Phase  Initial  Discharge    Distance  1343 feet  1666 feet    Distance % Change  -  24.66 %    Distance Feet Change  -  323 ft    Walk Time  6 minutes  6 minutes    # of Rest Breaks  0  0    MPH  2.54  3.72    METS  3.14  3.15    RPE  13  11    Perceived Dyspnea   -  0    VO2 Peak  10.99  13    Symptoms  No  -    Resting HR  87 bpm  85 bpm    Resting BP  128/80  110/70    Max Ex. HR  113 bpm  104 bpm    Max Ex. BP  118/82  129/78    2 Minute Post BP  122/78  108/60       Oxygen Initial Assessment:   Oxygen Re-Evaluation:   Oxygen Discharge (Final Oxygen Re-Evaluation):   Initial Exercise Prescription: Initial Exercise Prescription - 07/26/17 1400      Date of Initial Exercise RX and Referring Provider   Date  07/26/17    Referring Provider  Bensimhon, Daniel MD      Recumbant Bike   Level  2    Minutes  10    METs  2      NuStep   Level  2    SPM  80    Minutes  10    METs  2      Track   Laps  8    Minutes  10    METs  2.74      Prescription Details   Frequency (times per week)  3    Duration  Progress to 30 minutes of continuous aerobic without signs/symptoms of physical distress      Intensity   THRR 40-80% of Max Heartrate  63-126    Ratings of Perceived Exertion  11-13    Perceived Dyspnea  0-4      Progression   Progression  Continue to progress workloads to maintain intensity without signs/symptoms of physical distress.      Resistance Training   Training Prescription  Yes    Weight  2lbs    Reps  10-15       Perform Capillary Blood  Glucose checks as needed.  Exercise Prescription Changes: Exercise Prescription Changes    Row Name 08/03/17 1518 08/14/17 1500 08/22/17 0900 09/03/17 1600 09/17/17 0800     Response to Exercise   Blood Pressure (Admit)  102/60  106/69  110/68  126/88  110/72   Blood Pressure (Exercise)  138/68  138/70  140/80  146/84  130/60   Blood Pressure (Exit)  108/60  100/68  130/80  130/80  114/70   Heart Rate (Admit)  88 bpm  90 bpm  97 bpm  99 bpm  89 bpm   Heart Rate (Exercise)  118 bpm  117 bpm  126 bpm  130 bpm  107 bpm   Heart Rate (Exit)  94 bpm  95 bpm  106 bpm  109 bpm  95 bpm   Rating of Perceived Exertion (Exercise)  '12  13  13  13  13   '$ Symptoms  non  non  none  none  none   Comments  Pt oriented to exercise equipment; pt responded well to exercise session.   Pt oriented to exercise equipment; pt responded well to exercise session.   -  -  -   Duration  Continue with 30 min of aerobic exercise without signs/symptoms of physical distress.  Continue with 30 min of aerobic exercise without signs/symptoms of physical distress.  Continue with 30 min of aerobic exercise without signs/symptoms of physical distress.  Continue with 30 min of aerobic exercise without signs/symptoms of physical distress.  Continue with 30 min of aerobic exercise without signs/symptoms of physical distress.   Intensity  THRR unchanged  THRR unchanged  THRR unchanged  THRR unchanged  THRR unchanged     Progression   Progression  Continue to progress workloads to maintain intensity without signs/symptoms of physical distress.  Continue to progress workloads to maintain intensity without signs/symptoms of physical distress.  Continue to progress workloads to maintain intensity without signs/symptoms of physical distress.  Continue to progress workloads to maintain intensity without signs/symptoms of physical distress.  Continue to progress workloads to maintain intensity without signs/symptoms of physical distress.    Average METs  2.3  2.9  2.8  2.9  2.8     Resistance Training   Training Prescription  Yes  Yes  Yes  No Out early due to work schedule.  No Out early due to work schedule.   Weight  2lbs  2lbs  2lbs  -  -   Reps  10-15  10-15  10-15  -  -   Time  10 Minutes  10 Minutes  10 Minutes  -  -     Interval Training   Interval Training  -  -  No  No  No     Recumbant Bike   Level  '2  2  2  '$ 2.5  2.5   Minutes  '10  10  10  10  10   '$ METs  2  2.7  2.6  2.4  2.6     NuStep   Level  '2  2  2  3  4   '$ SPM  80  100  100  100  100   Minutes  '10  10  10  10  10   '$ METs  2.1  3.1  3.2  3.4  3.2     Track   Laps  '9  11  10  11  9   '$ Minutes  '10  10  10  10  10   '$ METs  2.57  2.92  2.74  2.92  2.57     Home Exercise Plan   Plans to continue exercise at  -  -  Home (comment)  Home (comment)  Home (comment)   Frequency  -  -  Add 2 additional days to program exercise sessions.  Add 2 additional days to program exercise sessions.  Add 2 additional days to program exercise sessions.   Row Name 10/01/17 0900 10/17/17 0655 10/31/17 0800         Response to Exercise   Blood Pressure (Admit)  102/68  103/69  104/60     Blood Pressure (Exercise)  112/78  122/78  128/80     Blood Pressure (Exit)  104/68  100/66  102/70     Heart Rate (Admit)  87 bpm  83 bpm  93 bpm     Heart Rate (Exercise)  116 bpm  104 bpm  114 bpm     Heart Rate (Exit)  87 bpm  83 bpm  96 bpm     Rating of Perceived Exertion (Exercise)  '13  13  13     '$ Symptoms  none  none  none     Duration  Continue with 30 min of aerobic exercise without signs/symptoms of physical distress.  Continue with 30 min of aerobic exercise without signs/symptoms of physical distress.  Continue with 30 min of aerobic exercise without signs/symptoms of physical distress.     Intensity  THRR unchanged  THRR unchanged  THRR unchanged       Progression   Progression  Continue to progress workloads to maintain intensity without signs/symptoms of physical  distress.  Continue to progress workloads to maintain intensity without signs/symptoms of physical distress.  Continue to progress workloads to maintain intensity without signs/symptoms of physical distress.     Average METs  3  3.1  3.2       Resistance Training   Training Prescription  No Out early due to work schedule.  No  No       Interval Training   Interval Training  No  No  No       Recumbant Bike   Level  2.5  2.7  2.7     Minutes  '10  10  10     '$ METs  2.5  2.7  2.7       NuStep   Level  '4  4  4     '$ SPM  100  100  100     Minutes  '10  10  10     '$ METs  3.8  3.8  3.8       Track   Laps  '10  11  12     '$ Minutes  '10  10  10     '$ METs  2.74  2.92  3.09       Home Exercise Plan   Plans to continue exercise at  Home (comment)  Home (comment)  Home (comment)     Frequency  Add 2 additional days to program exercise sessions.  Add 2 additional days  to program exercise sessions.  Add 2 additional days to program exercise sessions.     Initial Home Exercises Provided  -  09/05/17  09/05/17        Exercise Comments: Exercise Comments    Row Name 08/03/17 1521 08/22/17 0911 09/05/17 9833 09/17/17 0805 10/01/17 8250   Exercise Comments  pt was oriented to exercise equipment. Pt responded well to exercise session; will continue to monitor  Reviewed METs with patient. Pt is walking at least 10 minutes, 1-2 days/week at home.  Reviewed METs, goals and home exercise guidelines with patient.  Reviewed METs with patient.  Reviewed goals with patient.   [place] Name 10/19/17 0724 10/31/17 0700         Exercise Comments  Reviewed METs and goals with patient.  Reviewed METs with patient.         Exercise Goals and Review: Exercise Goals    Row Name 07/26/17 1351             Exercise Goals   Increase Physical Activity  Yes       Intervention  Provide advice, education, support and counseling about physical activity/exercise needs.;Develop an individualized exercise prescription for  aerobic and resistive training based on initial evaluation findings, risk stratification, comorbidities and participant's personal goals.       Expected Outcomes  Achievement of increased cardiorespiratory fitness and enhanced flexibility, muscular endurance and strength shown through measurements of functional capacity and personal statement of participant.       Increase Strength and Stamina  Yes return to work and being able to keep and play with grandkids       Intervention  Provide advice, education, support and counseling about physical activity/exercise needs.;Develop an individualized exercise prescription for aerobic and resistive training based on initial evaluation findings, risk stratification, comorbidities and participant's personal goals.       Expected Outcomes  Achievement of increased cardiorespiratory fitness and enhanced flexibility, muscular endurance and strength shown through measurements of functional capacity and personal statement of participant.          Exercise Goals Re-Evaluation : Exercise Goals Re-Evaluation    Row Name 08/14/17 1555 09/05/17 5397 10/01/17 0651 10/19/17 0724       Exercise Goal Re-Evaluation   Exercise Goals Review  Increase Physical Activity;Able to understand and use rate of perceived exertion (RPE) scale;Knowledge and understanding of Target Heart Rate Range (THRR);Understanding of Exercise Prescription;Increase Strength and Stamina;Able to check pulse independently  Able to understand and use rate of perceived exertion (RPE) scale;Knowledge and understanding of Target Heart Rate Range (THRR);Understanding of Exercise Prescription;Increase Physical Activity  Increase Physical Activity;Increase Strength and Stamina  Increase Physical Activity;Knowledge and understanding of Target Heart Rate Range (THRR)    Comments  Pt is progressing wel in cardiac rehab. Pt has increased SPM and MET level on Nustep machine and is exercising for 30 minutes  continuously.  Reviewed home exercise guidelines including THRR, RPE scale and endpoints for exercise.  Patient has been able to return to work. Pt is walking about 10 minutes when she walks but plans to gradually increase duration to 30 minutes.   Patient states she walked this weekend and making progress with increasing exercise. Reviewed THRR with patient.    Expected Outcomes  Pt will continue to improve in cardiorespiratory fitness and overall functional capacity.  Patient will walk 30 minutes 1 additional day at home.  Patient will walk 30 minutes 1 additional day at home.  Continue goal of  walking 30 minutes 1 additional day at home.        Discharge Exercise Prescription (Final Exercise Prescription Changes): Exercise Prescription Changes - 10/31/17 0800      Response to Exercise   Blood Pressure (Admit)  104/60    Blood Pressure (Exercise)  128/80    Blood Pressure (Exit)  102/70    Heart Rate (Admit)  93 bpm    Heart Rate (Exercise)  114 bpm    Heart Rate (Exit)  96 bpm    Rating of Perceived Exertion (Exercise)  13    Symptoms  none    Duration  Continue with 30 min of aerobic exercise without signs/symptoms of physical distress.    Intensity  THRR unchanged      Progression   Progression  Continue to progress workloads to maintain intensity without signs/symptoms of physical distress.    Average METs  3.2      Resistance Training   Training Prescription  No      Interval Training   Interval Training  No      Recumbant Bike   Level  2.7    Minutes  10    METs  2.7      NuStep   Level  4    SPM  100    Minutes  10    METs  3.8      Track   Laps  12    Minutes  10    METs  3.09      Home Exercise Plan   Plans to continue exercise at  Home (comment)    Frequency  Add 2 additional days to program exercise sessions.    Initial Home Exercises Provided  09/05/17       Nutrition:  Target Goals: Understanding of nutrition guidelines, daily intake of sodium  '1500mg'$ , cholesterol '200mg'$ , calories 30% from fat and 7% or less from saturated fats, daily to have 5 or more servings of fruits and vegetables.  Biometrics: Pre Biometrics - 07/26/17 1634      Pre Biometrics   Height  '5\' 3"'$  (1.6 m)    Weight  178 lb 9.2 oz (81 kg)    Hip Circumference  44 inches    BMI (Calculated)  31.64    % Body Fat  44.2 %    Grip Strength  30 kg    Flexibility  9.5 in    Single Leg Stand  11.4 seconds      Post Biometrics - 10/22/17 1516       Post  Biometrics   Height  '5\' 3"'$  (1.6 m)    Weight  177 lb 4 oz (80.4 kg)    Waist Circumference  37.5 inches    Hip Circumference  44 inches    Waist to Hip Ratio  0.85 %    BMI (Calculated)  31.41    Triceps Skinfold  47 mm    % Body Fat  44.2 %    Grip Strength  34 kg    Flexibility  10 in    Single Leg Stand  22.4 seconds       Nutrition Therapy Plan and Nutrition Goals: Nutrition Therapy & Goals - 07/26/17 1548      Nutrition Therapy   Diet  Carb Modified, Therapeutic Lifestyle Changes      Personal Nutrition Goals   Nutrition Goal  Pt to identify food quantities necessary to achieve weight loss of 6-24 lb (2.7-10.9 kg) at graduation from  cardiac rehab.     Personal Goal #2  Improved blood glucose control as evidenced by pt's a1c decreasing towards 7 or less.       Intervention Plan   Intervention  Prescribe, educate and counsel regarding individualized specific dietary modifications aiming towards targeted core components such as weight, hypertension, lipid management, diabetes, heart failure and other comorbidities.    Expected Outcomes  Short Term Goal: Understand basic principles of dietary content, such as calories, fat, sodium, cholesterol and nutrients.;Long Term Goal: Adherence to prescribed nutrition plan.       Nutrition Discharge: Nutrition Scores: Nutrition Assessments - 07/26/17 1548      MEDFICTS Scores   Pre Score  21       Nutrition Goals Re-Evaluation:   Nutrition Goals  Re-Evaluation:   Nutrition Goals Discharge (Final Nutrition Goals Re-Evaluation):   Psychosocial: Target Goals: Acknowledge presence or absence of significant depression and/or stress, maximize coping skills, provide positive support system. Participant is able to verbalize types and ability to use techniques and skills needed for reducing stress and depression.  Initial Review & Psychosocial Screening: Initial Psych Review & Screening - 07/26/17 [place]?  Yes      Barriers   Psychosocial barriers to participate in program  There are no identifiable barriers or psychosocial needs.      Screening Interventions   Interventions  Encouraged to exercise       Quality of Life Scores: Quality of Life - 11/02/17 1441      Quality of Life Scores   Health/Function Pre  22.5 %    Health/Function Post  20 %    Health/Function % Change  -11.11 %    Socioeconomic Pre  23 %    Socioeconomic Post  21.14 %    Socioeconomic % Change   -8.09 %    Psych/Spiritual Pre  21.93 %    Psych/Spiritual Post  21.14 %    Psych/Spiritual % Change  -3.6 %    Family Pre  27.6 %    Family Post  22.6 %    Family % Change  -18.12 %    GLOBAL Pre  23.24 %    GLOBAL Post  20.85 %    GLOBAL % Change  -10.28 %       PHQ-9: Recent Review Flowsheet Data    Depression screen [facility] 2/9 08/01/2017 08/01/2017   Decreased Interest - 0   Down, Depressed, Hopeless (No Data)  1   PHQ - 2 Score - 1     Interpretation of Total Score  Total Score Depression Severity:  1-4 = Minimal depression, 5-9 = Mild depression, 10-14 = Moderate depression, 15-19 = Moderately severe depression, 20-27 = Severe depression   Psychosocial Evaluation and Intervention: Psychosocial Evaluation - 10/30/17 1548      Psychosocial Evaluation & Interventions   Interventions  Encouraged to exercise with the program and follow exercise prescription    Comments  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 no psychosocial needs identified, no interventions necessary     Expected Outcomes  pt will exhibit positive outlook with good coping skills.     Continue Psychosocial Services   No Follow up required       Psychosocial Re-Evaluation: Psychosocial Re-Evaluation    [place] Name 08/10/17 1655 08/31/17 1112 09/03/17 0744 09/26/17 0743 11/02/17 1616     Psychosocial Re-Evaluation   Current issues with  None Identified  None Identified  None Identified  -  None Identified   Comments  no psychosocial needs identified, no interventions necessary.   no psychosocial needs identified, no interventions necessary.   no psychosocial needs identified, no interventions necessary.  pt is managing returning to work without difficulty.  pt has established ownership of her healthcare, daily  accessing  CHF s/s  and demonstrates appropriate MD notification.   no psychosocial needs identified, no interventions necessary.  pt is managing returning to work without difficulty.  pt has established ownership of her healthcare, daily  accessing  CHF s/s  and demonstrates appropriate MD notification.   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 no psychosocial needs identified, no interventions necessary    Expected Outcomes  pt will exhibit positive outlook with good coping skills.   pt will exhibit positive outlook with good coping skills.   pt will exhibit positive outlook with good coping skills.   pt will exhibit positive outlook with good coping skills.   pt will exhibit positive outlook with good coping skills.    Interventions  Stress management education;Relaxation education;Encouraged to attend Cardiac Rehabilitation for the exercise  Stress management education;Relaxation education;Encouraged to attend Cardiac Rehabilitation for the exercise  Stress management education;Relaxation education;Encouraged to attend Cardiac Rehabilitation for the exercise  Stress management education;Relaxation  education;Encouraged to attend Cardiac Rehabilitation for the exercise  Stress management education;Relaxation education;Encouraged to attend Cardiac Rehabilitation for the exercise   Continue Psychosocial Services   No Follow up required  No Follow up required  No Follow up required  No Follow up required  No Follow up required      Psychosocial Discharge (Final Psychosocial Re-Evaluation): Psychosocial Re-Evaluation - 11/02/17 1616      Psychosocial Re-Evaluation   Current issues with  None Identified    Comments  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 no psychosocial needs identified, no interventions necessary     Expected Outcomes  pt will exhibit positive outlook with good coping skills.     Interventions  Stress management education;Relaxation education;Encouraged to attend Cardiac Rehabilitation for the exercise    Continue Psychosocial Services   No Follow up required       Vocational Rehabilitation: Provide vocational rehab assistance to qualifying candidates.   Vocational Rehab Evaluation & Intervention: Vocational Rehab - 07/26/17 1632      Initial Vocational Rehab Evaluation & Intervention   Assessment shows need for Vocational Rehabilitation  No       Education: Education Goals: Education classes will be provided on a weekly basis, covering required topics. Participant will state understanding/return demonstration of topics presented.  Learning Barriers/Preferences: Learning Barriers/Preferences - 07/26/17 1452      Learning Barriers/Preferences   Learning Barriers  Sight       Education Topics: Count Your Pulse:  -Group instruction provided by verbal instruction, demonstration, patient participation and written materials to support subject.  Instructors address importance of being able to find your pulse and how to count your pulse when at home without a heart monitor.  Patients get hands on experience counting their pulse with staff help and  individually.   CARDIAC REHAB PHASE II EXERCISE from 09/14/2017 in [place]  Date  08/10/17  Instruction Review Code  2- meets goals/outcomes      Heart Attack, Angina, and Risk Factor Modification:  -Group instruction provided by verbal instruction, video, and written materials to support subject.  Instructors address signs and symptoms of angina and  heart attacks.    Also discuss risk factors for heart disease and how to make changes to improve heart health risk factors.   Functional Fitness:  -Group instruction provided by verbal instruction, demonstration, patient participation, and written materials to support subject.  Instructors address safety measures for doing things around the house.  Discuss how to get up and down off the floor, how to pick things up properly, how to safely get out of a chair without assistance, and balance training.   Meditation and Mindfulness:  -Group instruction provided by verbal instruction, patient participation, and written materials to support subject.  Instructor addresses importance of mindfulness and meditation practice to help reduce stress and improve awareness.  Instructor also leads participants through a meditation exercise.    Stretching for Flexibility and Mobility:  -Group instruction provided by verbal instruction, patient participation, and written materials to support subject.  Instructors lead participants through series of stretches that are designed to increase flexibility thus improving mobility.  These stretches are additional exercise for major muscle groups that are typically performed during regular warm up and cool down.   Hands Only CPR:  -Group verbal, video, and participation provides a basic overview of AHA guidelines for community CPR. Role-play of emergencies allow participants the opportunity to practice calling for help and chest compression technique with discussion of AED  use.   Hypertension: -Group verbal and written instruction that provides a basic overview of hypertension including the most recent diagnostic guidelines, risk factor reduction with self-care instructions and medication management.    Nutrition I class: Heart Healthy Eating:  -Group instruction provided by PowerPoint slides, verbal discussion, and written materials to support subject matter. The instructor gives an explanation and review of the Therapeutic Lifestyle Changes diet recommendations, which includes a discussion on lipid goals, dietary fat, sodium, fiber, plant stanol/sterol esters, sugar, and the components of a well-balanced, healthy diet.   CARDIAC REHAB PHASE II EXERCISE from 09/14/2017 in [place]  Date  08/31/17  Educator  RD  Instruction Review Code  Not applicable      Nutrition II class: Lifestyle Skills:  -Group instruction provided by PowerPoint slides, verbal discussion, and written materials to support subject matter. The instructor gives an explanation and review of label reading, grocery shopping for heart health, heart healthy recipe modifications, and ways to make healthier choices when eating out.   CARDIAC REHAB PHASE II EXERCISE from 09/14/2017 in [place]  Date  08/31/17  Educator  RD  Instruction Review Code  Not applicable      Diabetes Question & Answer:  -Group instruction provided by PowerPoint slides, verbal discussion, and written materials to support subject matter. The instructor gives an explanation and review of diabetes co-morbidities, pre- and post-prandial blood glucose goals, pre-exercise blood glucose goals, signs, symptoms, and treatment of hypoglycemia and hyperglycemia, and foot care basics.   CARDIAC REHAB PHASE II EXERCISE from 09/14/2017 in [place]  Date  09/14/17  Educator  RD  Instruction Review Code  2- meets goals/outcomes       Diabetes Blitz:  -Group instruction provided by PowerPoint slides, verbal discussion, and written materials to support subject matter. The instructor gives an explanation and review of the physiology behind type 1 and type 2 diabetes, diabetes medications and rational behind using different medications, pre- and post-prandial blood glucose recommendations and Hemoglobin A1c goals, diabetes diet, and exercise including blood glucose guidelines for exercising safely.  CARDIAC REHAB PHASE II EXERCISE from 09/14/2017 in [place]  Date  08/31/17  Educator  RD  Instruction Review Code  Not applicable      Portion Distortion:  -Group instruction provided by PowerPoint slides, verbal discussion, written materials, and food models to support subject matter. The instructor gives an explanation of serving size versus portion size, changes in portions sizes over the last 20 years, and what consists of a serving from each food group.   Stress Management:  -Group instruction provided by verbal instruction, video, and written materials to support subject matter.  Instructors review role of stress in heart disease and how to cope with stress positively.     CARDIAC REHAB PHASE II EXERCISE from 09/14/2017 in [place]  Date  08/01/17  Instruction Review Code  2- meets goals/outcomes      Exercising on Your Own:  -Group instruction provided by verbal instruction, power point, and written materials to support subject.  Instructors discuss benefits of exercise, components of exercise, frequency and intensity of exercise, and end points for exercise.  Also discuss use of nitroglycerin and activating EMS.  Review options of places to exercise outside of rehab.  Review guidelines for sex with heart disease.   Cardiac Drugs I:  -Group instruction provided by verbal instruction and written materials to support subject.  Instructor reviews  cardiac drug classes: antiplatelets, anticoagulants, beta blockers, and statins.  Instructor discusses reasons, side effects, and lifestyle considerations for each drug class.   CARDIAC REHAB PHASE II EXERCISE from 09/14/2017 in [place]  Date  08/15/17  Instruction Review Code  2- meets goals/outcomes      Cardiac Drugs II:  -Group instruction provided by verbal instruction and written materials to support subject.  Instructor reviews cardiac drug classes: angiotensin converting enzyme inhibitors (ACE-I), angiotensin II receptor blockers (ARBs), nitrates, and calcium channel blockers.  Instructor discusses reasons, side effects, and lifestyle considerations for each drug class.   Anatomy and Physiology of the Circulatory System:  Group verbal and written instruction and models provide basic cardiac anatomy and physiology, with the coronary electrical and arterial systems. Review of: AMI, Angina, Valve disease, Heart Failure, Peripheral Artery Disease, Cardiac Arrhythmia, Pacemakers, and the ICD.   Other Education:  -Group or individual verbal, written, or video instructions that support the educational goals of the cardiac rehab program.   Knowledge Questionnaire Score: Knowledge Questionnaire Score - 11/02/17 1440      Knowledge Questionnaire Score   Pre Score  21/24    Post Score  24/24       Core Components/Risk Factors/Patient Goals at Admission: Personal Goals and Risk Factors at Admission - 08/10/17 1651      Core Components/Risk Factors/Patient Goals on Admission    Weight Management  --    Expected Outcomes  --       Core Components/Risk Factors/Patient Goals Review:  Goals and Risk Factor Review    Row Name 08/10/17 1652 08/31/17 1111 09/03/17 0744 09/11/17 1517 09/26/17 0742     Core Components/Risk Factors/Patient Goals Review   Personal Goals Review  Weight Management/Obesity;Diabetes;Lipids;Hypertension;Other  Weight  Management/Obesity;Diabetes;Lipids;Hypertension;Other  Weight Management/Obesity;Diabetes;Lipids;Hypertension;Other  -  Weight Management/Obesity;Diabetes;Lipids;Hypertension;Other   Review  pt with multiple CAD RF demonstrates eagerness to participate in CR activities. pt plans to return to work next week, however has made arrangements to continue CR participation.    pt with multiple CAD RF demonstrates eagerness to participate  in CR activities. pt has been successful with managing CR participation and work schedule.    pt with multiple CAD RF demonstrates eagerness to participate in CR activities. pt has been successful with managing CR participation and work schedule.  pt reports she was able to complete walk aroud her block with taking 2 rest breaks.  pt with multiple CAD RF demonstrates eagerness to participate in CR activities. pt has been successful with managing CR participation and work schedule.  pt reports she was able to complete walk aroud her block with taking 2 rest breaks.  pt with multiple CAD RF demonstrates eagerness to participate in CR activities. pt has been successful with managing CR participation and work schedule.  pt reports improved heart failure symptoms   Expected Outcomes  pt will participate in CR exercise, nutrition and lifestyle modifications to reduce overall RF.   pt will participate in CR exercise, nutrition and lifestyle modifications to reduce overall RF.   pt will participate in CR exercise, nutrition and lifestyle modifications to reduce overall RF.   pt will participate in CR exercise, nutrition and lifestyle modifications to reduce overall RF.   pt will participate in CR exercise, nutrition and lifestyle modifications to reduce overall RF.    Startup Name 10/30/17 1548             Core Components/Risk Factors/Patient Goals Review   Personal Goals Review  Weight Management/Obesity;Diabetes;Lipids;Hypertension;Other       Review  pt with multiple CAD RF demonstrates  eagerness to participate in CR activities. pt has been successful with managing CR participation and work schedule.  pt reports continued improved heart failure symptoms       Expected Outcomes  pt will participate in CR exercise, nutrition and lifestyle modifications to reduce overall RF.           Core Components/Risk Factors/Patient Goals at Discharge (Final Review):  Goals and Risk Factor Review - 10/30/17 1548      Core Components/Risk Factors/Patient Goals Review   Personal Goals Review  Weight Management/Obesity;Diabetes;Lipids;Hypertension;Other    Review  pt with multiple CAD RF demonstrates eagerness to participate in CR activities. pt has been successful with managing CR participation and work schedule.  pt reports continued improved heart failure symptoms    Expected Outcomes  pt will participate in CR exercise, nutrition and lifestyle modifications to reduce overall RF.        ITP Comments: ITP Comments    Row Name 07/26/17 1346 08/15/17 0814 09/11/17 1516 10/30/17 1548     ITP Comments  Medical Director, Dr. Fransico Him  Medical Director, Dr. Fransico Him  30 day ITP review.  Pt with good participation and attendance.  No change in current regimen unless directed by Medical Director.    30 day ITP review.  Pt with good participation and attendance.  No change in current regimen unless directed by Medical Director.         Comments:

## 2017-11-05 ENCOUNTER — Encounter (HOSPITAL_COMMUNITY)
Admission: RE | Admit: 2017-11-05 | Discharge: 2017-11-05 | Disposition: A | Discharge status: Home / Self Care | Payer: 59 | Source: Ambulatory Visit | Attending: Internal Medicine | Admitting: Internal Medicine

## 2017-11-05 ENCOUNTER — Ambulatory Visit (HOSPITAL_COMMUNITY)
Admission: RE | Admit: 2017-11-05 | Discharge: 2017-11-05 | Disposition: A | Discharge status: Home / Self Care | Payer: 59 | Source: Ambulatory Visit | Attending: Cardiology | Admitting: Cardiology

## 2017-11-05 VITALS — BP 96/56 | HR 80 | Wt 177.0 lb

## 2017-11-05 DIAGNOSIS — Z7902 Long term (current) use of antithrombotics/antiplatelets: Secondary | ICD-10-CM | POA: Diagnosis not present

## 2017-11-05 DIAGNOSIS — I480 Paroxysmal atrial fibrillation: Secondary | ICD-10-CM | POA: Insufficient documentation

## 2017-11-05 DIAGNOSIS — I472 Ventricular tachycardia: Secondary | ICD-10-CM | POA: Insufficient documentation

## 2017-11-05 DIAGNOSIS — Z7982 Long term (current) use of aspirin: Secondary | ICD-10-CM | POA: Diagnosis not present

## 2017-11-05 DIAGNOSIS — Z953 Presence of xenogenic heart valve: Secondary | ICD-10-CM | POA: Insufficient documentation

## 2017-11-05 DIAGNOSIS — Z951 Presence of aortocoronary bypass graft: Secondary | ICD-10-CM | POA: Diagnosis present

## 2017-11-05 DIAGNOSIS — Z79899 Other long term (current) drug therapy: Secondary | ICD-10-CM | POA: Insufficient documentation

## 2017-11-05 DIAGNOSIS — I493 Ventricular premature depolarization: Secondary | ICD-10-CM | POA: Insufficient documentation

## 2017-11-05 DIAGNOSIS — R002 Palpitations: Secondary | ICD-10-CM | POA: Diagnosis not present

## 2017-11-05 DIAGNOSIS — I255 Ischemic cardiomyopathy: Secondary | ICD-10-CM | POA: Insufficient documentation

## 2017-11-05 DIAGNOSIS — E119 Type 2 diabetes mellitus without complications: Secondary | ICD-10-CM | POA: Insufficient documentation

## 2017-11-05 DIAGNOSIS — Z9889 Other specified postprocedural states: Secondary | ICD-10-CM | POA: Diagnosis not present

## 2017-11-05 DIAGNOSIS — I5043 Acute on chronic combined systolic (congestive) and diastolic (congestive) heart failure: Secondary | ICD-10-CM

## 2017-11-05 DIAGNOSIS — I213 ST elevation (STEMI) myocardial infarction of unspecified site: Secondary | ICD-10-CM | POA: Diagnosis not present

## 2017-11-05 DIAGNOSIS — I252 Old myocardial infarction: Secondary | ICD-10-CM | POA: Insufficient documentation

## 2017-11-05 DIAGNOSIS — I110 Hypertensive heart disease with heart failure: Secondary | ICD-10-CM | POA: Diagnosis not present

## 2017-11-05 DIAGNOSIS — Z7984 Long term (current) use of oral hypoglycemic drugs: Secondary | ICD-10-CM | POA: Diagnosis not present

## 2017-11-05 DIAGNOSIS — E785 Hyperlipidemia, unspecified: Secondary | ICD-10-CM | POA: Insufficient documentation

## 2017-11-05 DIAGNOSIS — Z8249 Family history of ischemic heart disease and other diseases of the circulatory system: Secondary | ICD-10-CM | POA: Insufficient documentation

## 2017-11-05 DIAGNOSIS — I2510 Atherosclerotic heart disease of native coronary artery without angina pectoris: Secondary | ICD-10-CM | POA: Insufficient documentation

## 2017-11-05 DIAGNOSIS — I340 Nonrheumatic mitral (valve) insufficiency: Secondary | ICD-10-CM | POA: Diagnosis not present

## 2017-11-05 DIAGNOSIS — I5022 Chronic systolic (congestive) heart failure: Secondary | ICD-10-CM | POA: Diagnosis present

## 2017-11-05 DIAGNOSIS — I4891 Unspecified atrial fibrillation: Secondary | ICD-10-CM | POA: Diagnosis not present

## 2017-11-05 LAB — BASIC METABOLIC PANEL
Anion gap: 9 (ref 5–15)
BUN: 33 mg/dL — ABNORMAL HIGH (ref 6–20)
CO2: 24 mmol/L (ref 22–32)
Calcium: 9.2 mg/dL (ref 8.9–10.3)
Chloride: 106 mmol/L (ref 101–111)
Creatinine, Ser: 1.53 mg/dL — ABNORMAL HIGH (ref 0.44–1.00)
GFR calc Af Amer: 41 mL/min — ABNORMAL LOW (ref >60)
GFR calc non Af Amer: 35 mL/min — ABNORMAL LOW (ref >60)
Glucose, Bld: 212 mg/dL — ABNORMAL HIGH (ref 65–99)
Potassium: 4 mmol/L (ref 3.5–5.1)
Sodium: 139 mmol/L (ref 135–145)

## 2017-11-05 MED ORDER — FUROSEMIDE 40 MG PO TABS: 80.0000 mg | ORAL_TABLET | Freq: Every day | ORAL | 3 refills | Status: DC

## 2017-11-05 MED ORDER — METOPROLOL SUCCINATE ER 25 MG PO TB24: 12.5000 mg | ORAL_TABLET | Freq: Every day | ORAL | 3 refills | Status: DC

## 2017-11-05 NOTE — Patient Instructions (Signed)
DO NOT TAKE FUROSEMIDE TODAY, starting tomorrow take Furosemide 80 mg DAILY  Decrease Metoprolol to 12.5 mg (1/2 tab) daily  Lab today  Your physician recommends that you schedule a follow-up appointment in: 3 months with echocardiogram

## 2017-11-05 NOTE — Progress Notes (Signed)
Advanced Heart Failure Medication Review by a Pharmacist  Does the patient  feel that his/her medications are working for him/her?  yes  Has the patient been experiencing any side effects to the medications prescribed?  no  Does the patient measure his/her own blood pressure or blood glucose at home?  yes   Does the patient have any problems obtaining medications due to transportation or finances?   no  Understanding of regimen: good Understanding of indications: good Potential of compliance: good Patient understands to avoid NSAIDs. Patient understands to avoid decongestants.  Issues to address at subsequent visits: None   Pharmacist comments: Rachael Davis is a pleasant 61 yo F presenting without a medication list but with good recall of her regimen. She reports good compliance with her regimen but does admit to missing her metformin 1-2x per week. She did not have any other medication-related questions or concerns for me at this time.   Tyler Deis. Bonnye Fava, PharmD, BCPS, CPP Clinical Pharmacist Pager: [phone] Phone: [phone] 11/05/2017 9:14 AM      Time with patient: 10 minutes Preparation and documentation time: 2 minutes Total time: 12 minutes

## 2017-11-05 NOTE — Progress Notes (Signed)
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    Admitted 6/20 though 05/28/2017 with volume overload and pleural effusion. Diuresed with IV lasix and transitioned to lasix 40 mg twice a day. Also had lots of PVCs so started on amiodarone. Discharge weight was 177 pounds.   10/2017 Holter monitor- 3% PVC burden.   She returns for HF follow up. Overall feeling fine. Still having palpitations. Drinking a few cups of coffee a day. Denies SOB/PND/Orthopnea/CP. Appetite ok. No fever or chills. Weight at home 173-177 pounds. No bleeding problems. Completing cardiac rehab this week. Taking all medications    ECHO 05/11/2017 - Left ventricle: The cavity size was moderately dilated. Wall thickness was increased in a pattern of mild LVH. Systolic function was moderately reduced. The estimated ejection fraction was in the range of 35% to 40%. Diffuse hypokinesis. The study is not technically sufficient to allow evaluation of LV diastolic function. - Mitral valve: Normal appearing bioprosthetic MV no perivalvular regurgitation. - Left atrium: The atrium was mildly dilated. - Atrial septum: No defect or patent foramen ovale was identified. - Pulmonary arteries: PA peak pressure: 35 mm Hg (S).  LHC 04/04/2017   Prox LAD lesion, 75 %stenosed.  Prox LAD to Mid LAD lesion, 65 %stenosed.  Mid LAD to Dist LAD lesion, 55 %stenosed.  Ramus lesion, 90 %stenosed.  Lat Ramus lesion, 50 %stenosed.  Prox Cx lesion, 100 %stenosed.  Prox RCA to Mid RCA lesion, 95 %stenosed.  There is moderate left ventricular systolic dysfunction.  LV end diastolic pressure is mildly elevated.  The left ventricular ejection fraction is 35-45% by visual estimate.  There is mild (2+) mitral regurgitation. 1. Severe 3 vessel obstructive CAD 2. Moderate LV dysfunction 3.  Mildly elevated LV EDP ROS: All systems negative except as listed in HPI, PMH and Problem List.  SH:  Social History   Socioeconomic History  . Marital status: Married    Spouse name: Not on file  . Number of children: Not on file  . Years of education: Not on file  . Highest education level: Not on file  Social Needs  . Financial resource strain: Not on file  . Food insecurity - worry: Not on file  . Food insecurity - inability: Not on file  . Transportation needs - medical: Not on file  . Transportation needs - non-medical: Not on file  Occupational History  . Not on file  Tobacco Use  . Smoking status: Never Smoker  . Smokeless tobacco: Never Used  Substance and Sexual Activity  . Alcohol use: No  . Drug use: No  . Sexual activity: Not on file  Other Topics Concern  . Not on file  Social History Narrative  . Not on file    FH:  Family History  Problem Relation Age of Onset  . Heart attack Father 66    Past Medical History:  Diagnosis Date  . Arrhythmia    Hx PAF, PVC's  . CHF (congestive heart failure) (HCC)   . Coronary artery disease    04/04/2017 STEMI  . Diabetes (HCC)   . Hyperlipidemia   . Hypertension   . New onset atrial fibrillation (HCC) 05/2017    Current Outpatient Medications  Medication Sig Dispense Refill  . acetaminophen (TYLENOL) 325 MG tablet Take 2 tablets (650 mg total) by mouth every  6 (six) hours as needed for mild pain or headache.    Marland Kitchen. aspirin EC 81 MG tablet Take 81 mg by mouth daily.    Marland Kitchen. atorvastatin (LIPITOR) 20 MG tablet TAKE 1 TABLET (20 MG TOTAL) BY MOUTH DAILY AT 6 PM. 30 tablet 1  . dapagliflozin propanediol (FARXIGA) 10 MG TABS tablet Take 10 mg by mouth daily.    . furosemide (LASIX) 40 MG tablet Take 80 mg in the AM and 40 mg in the PM 90 tablet 3  . glipiZIDE (GLUCOTROL XL) 10 MG 24 hr tablet Take 1 tablet (10 mg total) by mouth daily with breakfast.    . losartan (COZAAR) 25 MG tablet Take 1 tablet (25 mg total) by  mouth at bedtime. 30 tablet 6  . magnesium oxide (MAG-OX) 400 (241.3 Mg) MG tablet TAKE 1 TABLET BY MOUTH EVERY DAY 30 tablet 11  . metFORMIN (GLUCOPHAGE) 1000 MG tablet Take 1,000 mg by mouth 2 (two) times daily with a meal.    . metoprolol succinate (TOPROL XL) 25 MG 24 hr tablet Take 1 tablet (25 mg total) by mouth daily. 90 tablet 3  . saxagliptin HCl (ONGLYZA) 5 MG TABS tablet Take 5 mg by mouth daily.    Marland Kitchen. spironolactone (ALDACTONE) 25 MG tablet Take 0.5 tablets (12.5 mg total) by mouth daily. 45 tablet 3  . warfarin (COUMADIN) 2.5 MG tablet TAKE 1 - 1.5 TABLETS BY MOUTH DAILY OR AS DIRECTED BY COUMADIN CLINIC. 45 tablet 2   No current facility-administered medications for this encounter.     Vitals:   11/05/17 0914  BP: (!) 96/56  Pulse: 80  SpO2: 100%  Weight: 177 lb (80.3 kg)     PHYSICAL EXAM: General:  Well appearing. No resp difficulty. Walked in the clinic without difficulty.  HEENT: normal Neck: supple. no JVD. Carotids 2+ bilat; no bruits. No lymphadenopathy or thryomegaly appreciated. Cor: PMI nondisplaced. Regular rate & rhythm. No rubs, gallops or murmurs. Lungs: clear Abdomen: soft, nontender, nondistended. No hepatosplenomegaly. No bruits or masses. Good bowel sounds. Extremities: no cyanosis, clubbing, rash, edema Neuro: alert & orientedx3, cranial nerves grossly intact. moves all 4 extremities w/o difficulty. Affect pleasant  EKG: NSR 81 bpm 1st degree heart block 290 ms. Rare PVCs.   ASSESSMENT & PLAN:  1.Chronic Systolic Heart Failure: Ischemic cardiomyopathy, ECHO 05/24/2017 EF 35-40%.  -NYHA II.Volume status low. Hold lasix today. - On 12/4 start lasix 80 mg daily. She will stop lasix in the evening.  - Continue Spiro 12.5 mg hs -Continue losartan to 25 mg hs.  - Cut back metoprolol XL  To 12.5 mg daily with long PR interval. PR interval increased since October.   -BMET today.    2. CAD S/P CABG x4: - Denies chest pain - Continue statin and  ASA.  3. Bioprosthetic MVR:  - Stable, continue warfarin.   4. PVCs:  - Amio stopped per PVT. Holter monitor 11/5 with 3 % PVC burned. I discussed Holter monitor results.   Hold off on restarting amio.  - Instructed to cut back caffeine intake.    5. PAF - In NSR  Repeat ECHO.   Follow up in 3 months. Check BMET today.   Greater than 50% of the (total minutes 25) visit spent in counseling/coordination of care regarding the above.     Amy Clegg NP-C 9:16 AM

## 2017-11-07 ENCOUNTER — Encounter (HOSPITAL_COMMUNITY)
Admission: RE | Admit: 2017-11-07 | Discharge: 2017-11-07 | Disposition: A | Discharge status: Home / Self Care | Payer: 59 | Source: Ambulatory Visit | Attending: Internal Medicine | Admitting: Internal Medicine

## 2017-11-07 DIAGNOSIS — I213 ST elevation (STEMI) myocardial infarction of unspecified site: Secondary | ICD-10-CM | POA: Diagnosis not present

## 2017-11-07 DIAGNOSIS — Z951 Presence of aortocoronary bypass graft: Secondary | ICD-10-CM

## 2017-11-09 ENCOUNTER — Encounter (HOSPITAL_COMMUNITY)
Admission: RE | Admit: 2017-11-09 | Discharge: 2017-11-09 | Disposition: A | Discharge status: Home / Self Care | Payer: 59 | Source: Ambulatory Visit | Attending: Internal Medicine | Admitting: Internal Medicine

## 2017-11-09 ENCOUNTER — Other Ambulatory Visit (HOSPITAL_COMMUNITY): Payer: Self-pay | Admitting: Cardiology

## 2017-11-09 ENCOUNTER — Ambulatory Visit (INDEPENDENT_AMBULATORY_CARE_PROVIDER_SITE_OTHER): Payer: 59 | Admitting: Pharmacist

## 2017-11-09 ENCOUNTER — Encounter (HOSPITAL_COMMUNITY): Payer: Self-pay

## 2017-11-09 DIAGNOSIS — Z7901 Long term (current) use of anticoagulants: Secondary | ICD-10-CM

## 2017-11-09 DIAGNOSIS — Z951 Presence of aortocoronary bypass graft: Secondary | ICD-10-CM

## 2017-11-09 DIAGNOSIS — I4891 Unspecified atrial fibrillation: Secondary | ICD-10-CM

## 2017-11-09 DIAGNOSIS — I213 ST elevation (STEMI) myocardial infarction of unspecified site: Secondary | ICD-10-CM | POA: Diagnosis not present

## 2017-11-09 LAB — POCT INR: INR: 1.9

## 2017-11-09 NOTE — Progress Notes (Signed)
Daily Session Note  Patient Details  Name: Rachael Davis MRN: [id number] Date of Birth: 1956-03-05 Referring Provider:     CARDIAC REHAB PHASE II ORIENTATION from 07/26/2017 in [place]  Referring Provider  Glori Bickers MD      Encounter Date: 11/09/2017  Check In: Session Check In - 11/09/17 0725      Check-In   Location  [facility]    Staff Present  Seward Carol, MS, ACSM CEP, Exercise Physiologist;Tyara Carol Ada, MS,ACSM CEP, Exercise Physiologist;Joann Rion, RN, BSN    Supervising physician immediately available to respond to emergencies  [facility] immediately available    Physician(s)  Dr. Starla Link    Medication changes reported      No    Fall or balance concerns reported     No    Tobacco Cessation  No Change    Warm-up and Cool-down  Performed as group-led instruction    Resistance Training Performed  Yes    VAD Patient?  No      Pain Assessment   Currently in Pain?  No/denies    Multiple Pain Sites  No       Capillary Blood Glucose: No results found for this or any previous visit (from the past 24 hour(s)).  Exercise Prescription Changes - 11/09/17 0800      Response to Exercise   Blood Pressure (Admit)  122/82    Blood Pressure (Exercise)  154/80    Blood Pressure (Exit)  120/78    Heart Rate (Admit)  95 bpm    Heart Rate (Exercise)  109 bpm    Heart Rate (Exit)  92 bpm    Rating of Perceived Exertion (Exercise)  13    Symptoms  none    Duration  Continue with 30 min of aerobic exercise without signs/symptoms of physical distress.    Intensity  THRR unchanged      Progression   Progression  Continue to progress workloads to maintain intensity without signs/symptoms of physical distress.    Average METs  3.1      Resistance Training   Training Prescription  Yes    Weight  2    Reps  10-15    Time  10 Minutes      Recumbant Bike   Level  2.7    Minutes  10    METs  2.7      NuStep    Level  4    SPM  100    Minutes  10    METs  3.4      Home Exercise Plan   Plans to continue exercise at  Home (comment)    Frequency  Add 2 additional days to program exercise sessions.    Initial Home Exercises Provided  09/05/17       Social History   Tobacco Use  Smoking Status Never Smoker  Smokeless Tobacco Never Used    Goals Met:  Exercise tolerated well  Goals Unmet:  Not Applicable  Comments: pt graduated with 36 sessions.  Pt congratulated on her success.    Dr. Fransico Him is Medical Director for Cardiac Rehab at [facility].

## 2017-11-22 ENCOUNTER — Ambulatory Visit (INDEPENDENT_AMBULATORY_CARE_PROVIDER_SITE_OTHER): Payer: 59 | Admitting: Pharmacist Clinician (PhC)/ Clinical Pharmacy Specialist

## 2017-11-22 DIAGNOSIS — Z951 Presence of aortocoronary bypass graft: Secondary | ICD-10-CM

## 2017-11-22 DIAGNOSIS — I4891 Unspecified atrial fibrillation: Secondary | ICD-10-CM

## 2017-11-22 DIAGNOSIS — Z7901 Long term (current) use of anticoagulants: Secondary | ICD-10-CM | POA: Diagnosis not present

## 2017-11-22 LAB — POCT INR: INR: 1.9

## 2017-11-22 NOTE — Patient Instructions (Signed)
Description   Increase dose to 2 tablets daily except 1.5 tablets each Monday, Wednesday and Friday.  Repeat INR in 2 weeks

## 2017-11-25 ENCOUNTER — Other Ambulatory Visit: Payer: Self-pay | Admitting: Cardiovascular Disease

## 2017-11-26 NOTE — Telephone Encounter (Signed)
Please review for refill. Thanks!  

## 2017-11-28 ENCOUNTER — Telehealth (HOSPITAL_COMMUNITY): Payer: Self-pay | Admitting: *Deleted

## 2017-11-28 NOTE — Telephone Encounter (Signed)
Notes recorded by Dolores Patty, MD on 11/20/2017 at 12:27 AM EST 3% PVCs. Unlikely to cause problem with LV function.  Attempted to call pt with results and Left message to call back

## 2017-11-28 NOTE — Telephone Encounter (Signed)
Pt aware.

## 2017-11-28 NOTE — Telephone Encounter (Signed)
NL pt. 

## 2017-12-03 NOTE — Addendum Note (Signed)
Encounter addended by: Robyne Peers, RN on: 12/03/2017 7:43 AM  Actions taken: Sign clinical note

## 2017-12-03 NOTE — Progress Notes (Signed)
Discharge Progress Report  Patient Details  Name: Rachael Davis MRN: [id number] Date of Birth: 01/15/1956 Referring Provider:     CARDIAC REHAB PHASE II ORIENTATION from 07/26/2017 in [facility]  Referring Provider  Glori Bickers MD       Number of Visits: 36  Reason for Discharge:  Patient has met program and personal goals.  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  ADL [facility]:   Initial Exercise Prescription: Initial Exercise Prescription - 07/26/17 1400      Date of Initial Exercise RX and Referring Provider   Date  07/26/17    Referring Provider  Bensimhon, Daniel MD      Recumbant Bike   Level  2    Minutes  10    METs  2      NuStep   Level  2    SPM  80    Minutes  10    METs  2      Track   Laps  8    Minutes  10    METs  2.74      Prescription Details   Frequency (times per week)  3    Duration  Progress to 30 minutes of continuous aerobic without signs/symptoms of physical distress      Intensity   THRR 40-80% of Max Heartrate  63-126    Ratings of Perceived Exertion  11-13    Perceived Dyspnea  0-4      Progression   Progression  Continue to progress workloads to maintain intensity without signs/symptoms of physical distress.      Resistance Training   Training Prescription  Yes    Weight  2lbs    Reps  10-15       Discharge Exercise Prescription (Final Exercise Prescription Changes): Exercise Prescription Changes - 11/09/17 0800      Response to Exercise   Blood Pressure (Admit)  122/82    Blood Pressure (Exercise)  154/80    Blood Pressure (Exit)  120/78    Heart Rate (Admit)  95 bpm    Heart Rate (Exercise)  109 bpm    Heart Rate (Exit)  92 bpm    Rating of Perceived Exertion (Exercise)  13    Symptoms  none     Duration  Continue with 30 min of aerobic exercise without signs/symptoms of physical distress.    Intensity  THRR unchanged      Progression   Progression  Continue to progress workloads to maintain intensity without signs/symptoms of physical distress.    Average METs  3.1      Resistance Training   Training Prescription  Yes    Weight  2    Reps  10-15    Time  10 Minutes      Recumbant Bike   Level  2.7    Minutes  10    METs  2.7      NuStep   Level  4    SPM  100    Minutes  10    METs  3.4      Home Exercise Plan   Plans to continue exercise at  Home (comment)    Frequency  Add 2 additional days to program exercise sessions.    Initial  Home Exercises Provided  09/05/17       Functional Capacity: 6 Minute Walk    Row Name 07/26/17 1452 10/22/17 1514       6 Minute Walk   Phase  Initial  Discharge    Distance  1343 feet  1666 feet    Distance % Change  -  24.66 %    Distance Feet Change  -  323 ft    Walk Time  6 minutes  6 minutes    # of Rest Breaks  0  0    MPH  2.54  3.72    METS  3.14  3.15    RPE  13  11    Perceived Dyspnea   -  0    VO2 Peak  10.99  13    Symptoms  No  -    Resting HR  87 bpm  85 bpm    Resting BP  128/80  110/70    Max Ex. HR  113 bpm  104 bpm    Max Ex. BP  118/82  129/78    2 Minute Post BP  122/78  108/60       Psychological, QOL, Others - Outcomes: PHQ 2/9: Depression screen [facility] 2/9 11/09/2017 08/01/2017 08/01/2017  Decreased Interest 0 - 0  Down, Depressed, Hopeless 0 (No Data) 1  PHQ - 2 Score 0 - 1    Quality of Life: Quality of Life - 11/02/17 1441      Quality of Life Scores   Health/Function Pre  22.5 %    Health/Function Post  20 %    Health/Function % Change  -11.11 %    Socioeconomic Pre  23 %    Socioeconomic Post  21.14 %    Socioeconomic % Change   -8.09 %    Psych/Spiritual Pre  21.93 %    Psych/Spiritual Post  21.14 %    Psych/Spiritual % Change  -3.6 %    Family Pre  27.6 %    Family Post   22.6 %    Family % Change  -18.12 %    GLOBAL Pre  23.24 %    GLOBAL Post  20.85 %    GLOBAL % Change  -10.28 %       Personal Goals: Goals established at orientation with interventions provided to work toward goal. Personal Goals and Risk Factors at Admission - 08/10/17 1651      Core Components/Risk Factors/Patient Goals on Admission    Weight Management  --    Expected Outcomes  --        Personal Goals Discharge: Goals and Risk Factor Review    Row Name 08/10/17 1652 08/31/17 1111 09/03/17 0744 09/11/17 1517 09/26/17 0742     Core Components/Risk Factors/Patient Goals Review   Personal Goals Review  Weight Management/Obesity;Diabetes;Lipids;Hypertension;Other  Weight Management/Obesity;Diabetes;Lipids;Hypertension;Other  Weight Management/Obesity;Diabetes;Lipids;Hypertension;Other  -  Weight Management/Obesity;Diabetes;Lipids;Hypertension;Other   Review  pt with multiple CAD RF demonstrates eagerness to participate in CR activities. pt plans to return to work next week, however has made arrangements to continue CR participation.    pt with multiple CAD RF demonstrates eagerness to participate in CR activities. pt has been successful with managing CR participation and work schedule.    pt with multiple CAD RF demonstrates eagerness to participate in CR activities. pt has been successful with managing CR participation and work schedule.  pt reports she was able to complete walk aroud her block with taking 2 rest  breaks.  pt with multiple CAD RF demonstrates eagerness to participate in CR activities. pt has been successful with managing CR participation and work schedule.  pt reports she was able to complete walk aroud her block with taking 2 rest breaks.  pt with multiple CAD RF demonstrates eagerness to participate in CR activities. pt has been successful with managing CR participation and work schedule.  pt reports improved heart failure symptoms   Expected Outcomes  pt will participate  in CR exercise, nutrition and lifestyle modifications to reduce overall RF.   pt will participate in CR exercise, nutrition and lifestyle modifications to reduce overall RF.   pt will participate in CR exercise, nutrition and lifestyle modifications to reduce overall RF.   pt will participate in CR exercise, nutrition and lifestyle modifications to reduce overall RF.   pt will participate in CR exercise, nutrition and lifestyle modifications to reduce overall RF.    [place] Name 10/30/17 1548 11/09/17 1546 11/09/17 1605         Core Components/Risk Factors/Patient Goals Review   Personal Goals Review  Weight Management/Obesity;Diabetes;Lipids;Hypertension;Other  Weight Management/Obesity;Diabetes;Lipids;Hypertension;Other  (Pended)   Weight Management/Obesity;Diabetes;Lipids;Hypertension;Other     Review  pt with multiple CAD RF demonstrates eagerness to participate in CR activities. pt has been successful with managing CR participation and work schedule.  pt reports continued improved heart failure symptoms  -  pt demonstrated significant improvement strength/stamina and decreased symptoms. pt has lost 4 lbs during CR program.  pt would like to continue her weight loss regimen to reach her goal weight. pt plans to exercise on her own walking at work and joining [employer].       Expected Outcomes  pt will participate in CR exercise, nutrition and lifestyle modifications to reduce overall RF.   -  pt will participate in  exercise, nutrition and lifestyle modifications to reduce overall RF.         Exercise Goals and Review: Exercise Goals    Row Name 07/26/17 1351             Exercise Goals   Increase Physical Activity  Yes       Intervention  Provide advice, education, support and counseling about physical activity/exercise needs.;Develop an individualized exercise prescription for aerobic and resistive training based on initial evaluation findings, risk stratification, comorbidities and  participant's personal goals.       Expected Outcomes  Achievement of increased cardiorespiratory fitness and enhanced flexibility, muscular endurance and strength shown through measurements of functional capacity and personal statement of participant.       Increase Strength and Stamina  Yes return to work and being able to keep and play with grandkids       Intervention  Provide advice, education, support and counseling about physical activity/exercise needs.;Develop an individualized exercise prescription for aerobic and resistive training based on initial evaluation findings, risk stratification, comorbidities and participant's personal goals.       Expected Outcomes  Achievement of increased cardiorespiratory fitness and enhanced flexibility, muscular endurance and strength shown through measurements of functional capacity and personal statement of participant.          Nutrition & Weight - Outcomes: Pre Biometrics - 07/26/17 1634      Pre Biometrics   Height  '5\' 3"'$  (1.6 m)    Weight  178 lb 9.2 oz (81 kg)    Hip Circumference  44 inches    BMI (Calculated)  31.64    % Body Fat  44.2 %    Grip Strength  30 kg    Flexibility  9.5 in    Single Leg Stand  11.4 seconds      Post Biometrics - 10/22/17 1516       Post  Biometrics   Height  '5\' 3"'$  (1.6 m)    Weight  177 lb 4 oz (80.4 kg)    Waist Circumference  37.5 inches    Hip Circumference  44 inches    Waist to Hip Ratio  0.85 %    BMI (Calculated)  31.41    Triceps Skinfold  47 mm    % Body Fat  44.2 %    Grip Strength  34 kg    Flexibility  10 in    Single Leg Stand  22.4 seconds       Nutrition: Nutrition Therapy & Goals - 07/26/17 1548      Nutrition Therapy   Diet  Carb Modified, Therapeutic Lifestyle Changes      Personal Nutrition Goals   Nutrition Goal  Pt to identify food quantities necessary to achieve weight loss of 6-24 lb (2.7-10.9 kg) at graduation from cardiac rehab.     Personal Goal #2  Improved blood  glucose control as evidenced by pt's a1c decreasing towards 7 or less.       Intervention Plan   Intervention  Prescribe, educate and counsel regarding individualized specific dietary modifications aiming towards targeted core components such as weight, hypertension, lipid management, diabetes, heart failure and other comorbidities.    Expected Outcomes  Short Term Goal: Understand basic principles of dietary content, such as calories, fat, sodium, cholesterol and nutrients.;Long Term Goal: Adherence to prescribed nutrition plan.       Nutrition Discharge: Nutrition Assessments - 11/05/17 0905      MEDFICTS Scores   Pre Score  21    Post Score  27    Score Difference  6       Education Questionnaire Score: Knowledge Questionnaire Score - 11/02/17 1440      Knowledge Questionnaire Score   Pre Score  21/24    Post Score  24/24       Goals reviewed with patient; copy given to patient.

## 2017-12-03 NOTE — Addendum Note (Signed)
Encounter addended by: Robyne Peers, RN on: 12/03/2017 7:44 AM  Actions taken: Episode resolved

## 2017-12-16 ENCOUNTER — Other Ambulatory Visit: Payer: Self-pay | Admitting: Cardiovascular Disease

## 2017-12-17 NOTE — Telephone Encounter (Signed)
Rx(s) sent to pharmacy electronically.  

## 2017-12-17 NOTE — Telephone Encounter (Signed)
Please review for refill, Thanks !  

## 2017-12-18 ENCOUNTER — Ambulatory Visit (INDEPENDENT_AMBULATORY_CARE_PROVIDER_SITE_OTHER): Payer: 59 | Admitting: Pharmacist

## 2017-12-18 DIAGNOSIS — Z7901 Long term (current) use of anticoagulants: Secondary | ICD-10-CM | POA: Diagnosis not present

## 2017-12-18 DIAGNOSIS — Z951 Presence of aortocoronary bypass graft: Secondary | ICD-10-CM | POA: Diagnosis not present

## 2017-12-18 DIAGNOSIS — I4891 Unspecified atrial fibrillation: Secondary | ICD-10-CM

## 2017-12-18 LAB — POCT INR: INR: 2

## 2017-12-18 NOTE — Patient Instructions (Signed)
Vitamin K Foods and Warfarin Warfarin is a blood thinner (anticoagulant). Anticoagulant medicines help prevent the formation of blood clots. These medicines work by decreasing the activity of vitamin K, which promotes normal blood clotting. When you take warfarin, problems can occur from suddenly increasing or decreasing the amount of vitamin K that you eat from one day to the next. Problems may include:  Blood clots.  Bleeding.  What general guidelines do I need to follow? To avoid problems when taking warfarin:  Eat a balanced diet that includes: ? Fresh fruits and vegetables. ? Whole grains. ? Low-fat dairy products. ? Lean proteins, such as fish, eggs, and lean cuts of meat.  Keep your intake of vitamin K consistent from day to day. To do this: ? Avoid eating large amounts of vitamin K one day and low amounts of vitamin K the next day. ? If you take a multivitamin that contains vitamin K, be sure to take it every day. ? Know which foods contain vitamin K. Use the lists below to understand serving sizes and the amount of vitamin K in one serving.  Avoid major changes in your diet. If you are going to change your diet, talk with your health care provider before making changes.  Work with a nutrition specialist (dietitian) to develop a meal plan that works best for you.  High vitamin K foods Foods that are high in vitamin K contain more than 100 mcg (micrograms) per serving. These include:  Broccoli (cooked) -  cup has 110 mcg.  Brussels sprouts (cooked) -  cup has 109 mcg.  Greens, beet (cooked) -  cup has 350 mcg.  Greens, collard (cooked) -  cup has 418 mcg.  Greens, turnip (cooked) -  cup has 265 mcg.  Green onions or scallions -  cup has 105 mcg.  Kale (fresh or frozen) -  cup has 531 mcg.  Parsley (raw) - 10 sprigs has 164 mcg.  Spinach (cooked) -  cup has 444 mcg.  Swiss chard (cooked) -  cup has 287 mcg.  Moderate vitamin K foods Foods that have a  moderate amount of vitamin K contain 25-100 mcg per serving. These include:  Asparagus (cooked) - 5 spears have 38 mcg.  Black-eyed peas (dried) -  cup has 32 mcg.  Cabbage (cooked) -  cup has 37 mcg.  Kiwi fruit - 1 medium has 31 mcg.  Lettuce - 1 cup has 57-63 mcg.  Okra (frozen) -  cup has 44 mcg.  Prunes (dried) - 5 prunes have 25 mcg.  Watercress (raw) - 1 cup has 85 mcg.  Low vitamin K foods Foods low in vitamin K contain less than 25 mcg per serving. These include:  Artichoke - 1 medium has 18 mcg.  Avocado - 1 oz. has 6 mcg.  Blueberries -  cup has 14 mcg.  Cabbage (raw) -  cup has 21 mcg.  Carrots (cooked) -  cup has 11 mcg.  Cauliflower (raw) -  cup has 11 mcg.  Cucumber with peel (raw) -  cup has 9 mcg.  Grapes -  cup has 12 mcg.  Mango - 1 medium has 9 mcg.  Nuts - 1 oz. has 15 mcg.  Pear - 1 medium has 8 mcg.  Peas (cooked) -  cup has 19 mcg.  Pickles - 1 spear has 14 mcg.  Pumpkin seeds - 1 oz. has 13 mcg.  Sauerkraut (canned) -  cup has 16 mcg.  Soybeans (cooked) -  cup   has 16 mcg.  Tomato (raw) - 1 medium has 10 mcg.  Tomato sauce -  cup has 17 mcg.  Vitamin K-free foods If a food contain less than 5 mcg per serving, it is considered to have no vitamin K. These foods include:  Bread and cereal products.  Cheese.  Eggs.  Fish and shellfish.  Meat and poultry.  Milk and dairy products.  Sunflower seeds.  Actual amounts of vitamin K in foods may be different depending on processing. Talk with your dietitian about what foods you can eat and what foods you should avoid. This information is not intended to replace advice given to you by your health care provider. Make sure you discuss any questions you have with your health care provider. Document Released: 09/17/2009 Document Revised: 06/11/2016 Document Reviewed: 02/23/2016 Elsevier Interactive Patient Education  2018 [employer]  

## 2018-01-01 ENCOUNTER — Ambulatory Visit (INDEPENDENT_AMBULATORY_CARE_PROVIDER_SITE_OTHER): Payer: 59 | Admitting: Pharmacist Clinician (PhC)/ Clinical Pharmacy Specialist

## 2018-01-01 ENCOUNTER — Ambulatory Visit (INDEPENDENT_AMBULATORY_CARE_PROVIDER_SITE_OTHER): Payer: 59 | Admitting: Cardiovascular Disease

## 2018-01-01 ENCOUNTER — Encounter: Payer: Self-pay | Admitting: Cardiovascular Disease

## 2018-01-01 VITALS — BP 102/62 | HR 82 | Ht 63.0 in | Wt 180.4 lb

## 2018-01-01 DIAGNOSIS — Z951 Presence of aortocoronary bypass graft: Secondary | ICD-10-CM | POA: Diagnosis not present

## 2018-01-01 DIAGNOSIS — R1011 Right upper quadrant pain: Secondary | ICD-10-CM

## 2018-01-01 DIAGNOSIS — I4891 Unspecified atrial fibrillation: Secondary | ICD-10-CM

## 2018-01-01 DIAGNOSIS — I493 Ventricular premature depolarization: Secondary | ICD-10-CM | POA: Diagnosis not present

## 2018-01-01 DIAGNOSIS — I10 Essential (primary) hypertension: Secondary | ICD-10-CM

## 2018-01-01 DIAGNOSIS — I5042 Chronic combined systolic (congestive) and diastolic (congestive) heart failure: Secondary | ICD-10-CM

## 2018-01-01 DIAGNOSIS — Z7901 Long term (current) use of anticoagulants: Secondary | ICD-10-CM

## 2018-01-01 DIAGNOSIS — I480 Paroxysmal atrial fibrillation: Secondary | ICD-10-CM

## 2018-01-01 LAB — COMPREHENSIVE METABOLIC PANEL
ALT: 30 IU/L (ref 0–32)
AST: 23 IU/L (ref 0–40)
Albumin/Globulin Ratio: 1.5 (ref 1.2–2.2)
Albumin: 4.2 g/dL (ref 3.6–4.8)
Alkaline Phosphatase: 137 IU/L — ABNORMAL HIGH (ref 39–117)
BUN/Creatinine Ratio: 22 (ref 12–28)
BUN: 26 mg/dL (ref 8–27)
Bilirubin Total: <0.2 mg/dL (ref 0.0–1.2)
CO2: 19 mmol/L — ABNORMAL LOW (ref 20–29)
Calcium: 9.3 mg/dL (ref 8.7–10.3)
Chloride: 105 mmol/L (ref 96–106)
Creatinine, Ser: 1.17 mg/dL — ABNORMAL HIGH (ref 0.57–1.00)
GFR calc Af Amer: 58 mL/min/{1.73_m2} — ABNORMAL LOW (ref >59)
GFR calc non Af Amer: 50 mL/min/{1.73_m2} — ABNORMAL LOW (ref >59)
Globulin, Total: 2.8 g/dL (ref 1.5–4.5)
Glucose: 77 mg/dL (ref 65–99)
Potassium: 4.2 mmol/L (ref 3.5–5.2)
Sodium: 143 mmol/L (ref 134–144)
Total Protein: 7 g/dL (ref 6.0–8.5)

## 2018-01-01 LAB — CBC WITH DIFFERENTIAL/PLATELET
Basophils Absolute: 0 10*3/uL (ref 0.0–0.2)
Basos: 0 %
EOS (ABSOLUTE): 0.1 10*3/uL (ref 0.0–0.4)
Eos: 2 %
Hematocrit: 36 % (ref 34.0–46.6)
Hemoglobin: 11.8 g/dL (ref 11.1–15.9)
Immature Grans (Abs): 0 10*3/uL (ref 0.0–0.1)
Immature Granulocytes: 0 %
Lymphocytes Absolute: 2 10*3/uL (ref 0.7–3.1)
Lymphs: 28 %
MCH: 23 pg — ABNORMAL LOW (ref 26.6–33.0)
MCHC: 32.8 g/dL (ref 31.5–35.7)
MCV: 70 fL — ABNORMAL LOW (ref 79–97)
Monocytes Absolute: 0.6 10*3/uL (ref 0.1–0.9)
Monocytes: 8 %
Neutrophils Absolute: 4.4 10*3/uL (ref 1.4–7.0)
Neutrophils: 62 %
Platelets: 203 10*3/uL (ref 150–379)
RBC: 5.13 x10E6/uL (ref 3.77–5.28)
RDW: 17 % — ABNORMAL HIGH (ref 12.3–15.4)
WBC: 7.1 10*3/uL (ref 3.4–10.8)

## 2018-01-01 LAB — LIPID PANEL
Chol/HDL Ratio: 2.2 ratio (ref 0.0–4.4)
Cholesterol, Total: 123 mg/dL (ref 100–199)
HDL: 56 mg/dL (ref >39)
LDL Calculated: 61 mg/dL (ref 0–99)
Triglycerides: 30 mg/dL (ref 0–149)
VLDL Cholesterol Cal: 6 mg/dL (ref 5–40)

## 2018-01-01 LAB — AMYLASE: Amylase: 156 U/L — ABNORMAL HIGH (ref 31–124)

## 2018-01-01 LAB — POCT INR: INR: 1.7

## 2018-01-01 LAB — LIPASE: Lipase: 69 U/L (ref 14–72)

## 2018-01-01 MED ORDER — WARFARIN SODIUM 5 MG PO TABS: ORAL_TABLET | ORAL | 1 refills | Status: DC

## 2018-01-01 NOTE — Patient Instructions (Addendum)
Medication Instructions:  Your physician recommends that you continue on your current medications as directed. Please refer to the Current Medication list given to you today.  Labwork: LP/CMET/CBC/LIPASE/AMYLASE TODAY   Testing/Procedures: YOUR ARE SCHEDULED FOR ULTRASOUND OF YOUR GALLBLADDER ON 01/02/18 ARRIVE 9:25 FOR 9:45 AT [facility] LOC[facility] AT [facility]. IF YOU NEED TO RESCHEDULE CALL THEM DIRECTLY AT [phone]  NOTHING TO EAT OR DRINK AFTER MIDNIGHT   Follow-Up: Your physician wants you to follow-up in: 1 YEAR OV You will receive a reminder letter in the mail two months in advance. If you don't receive a letter, please call our office to schedule the follow-up appointment.  If you need a refill on your cardiac medications before your next appointment, please call your pharmacy.

## 2018-01-01 NOTE — Progress Notes (Signed)
Cardiology Office Note   Date:  01/01/2018   ID:  Vina, Byrd 1956/03/19, MRN [id number]  PCP:  Deatra James, MD  Cardiologist:   Chilton Si, MD  Heart Failure: Dr. Gala Romney  Chief Complaint  Patient presents with  . Follow-up  . Shortness of Breath     History of Present Illness: Rachael Davis is a 62 y.o. 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  [name] presented 04/2017 with STEMI.  Left heart catheterization revealed 100% left circumflex, 95% proximal RCA, 90% ramus intermedius, and 65% LAD.   LVEF was 35-45%.   Her echo 04/05/17 revealed LVEF 45% with lateral hypokinesis and mild MR..  She underwent four-vessel CABG (LIMA-->LAD, SVG-->RI, SVG-->OM, SVG-->RCA) on 04/16/17. In the OR she was also noted to have moderate to severe mitral regurgitation so she also underwent replacement of the mitral valve with a 25 mm [facility] Ease bioprosthetic valve.  During the hospitalization she required diuresis for acute systolic and diastolic heart failure.  She also had a brief episode of atrial fibrillation that resolved with metoprolol.  She saw [name] in clinic 05/08/17 and was volume overloaded.  Lasix and metoprolol were increased.  She followed up with Dr. Donata Clay 05/23/17 and was found to be in atrial fibrillation.  She had a repeat echocardiogram 05/24/17 that revealed LVEF 35-40% with akinesis of the inferolateral myocardium and basal inferior myocardium. Her bioprosthetic mitral valve was functioning well. There was a small mobile target onset prosthesis that was thought to be residual native valve.  She was admitted and the heart failure team was consulted.  She spontaneously converted to sinus tachycardia. Metoprolol was discontinued 2/2 low output and she was started on amiodarone.  She underwent L thoracentesis.  Her discharge  weight was 177 pounds. She was started on carvedilol 3.125mg  bid.  She wore a 48 hour Holter 10/2017 that showed 3% PVCs.  [name] last followed up with the heart failure clinic 11/2017.  At that time she was doing well.  At that time metoprolol was reduced due to long PR interval and amiodarone was discontinued.  She notes that her chest is still sore from surgery.  The left side of her chest is numb.  The soreness is  Slowly improving. She has sharp pains in her left breast that occur randomly.  It is worse when she gets up in the morning and feels like something is pulling.  She sometimes has a heavy feeling in her chest that typically occurs when she is sitting at work.  It improves with deep breathing.  There is no associated nausea or diaphoresis.  She walks for exercise and has no exertional chest pain or shortness of breath.  In fact, she feels better with exercise.  Two weeks ago she started noting sharp right upper quadrant pain after eating.  It is associated with nausea.  She denies fever or chills.  She has a cough that has been present for the last 2-3 weeks.  It is nonproductive and she denies shortness of breath.  She has not noted any lower extremity edema, orthopnea, or PND.  She does continue to have palpitations and is worried about these, as her understanding was that her Holter monitor was normal.   Past Medical History:  Diagnosis Date  . Arrhythmia    Hx PAF, PVC's  . CHF (congestive heart  failure) (HCC)   . Coronary artery disease    04/04/2017 STEMI  . Diabetes (HCC)   . Hyperlipidemia   . Hypertension   . New onset atrial fibrillation (HCC) 05/2017    Past Surgical History:  Procedure Laterality Date  . CORONARY ARTERY BYPASS GRAFT N/A 04/12/2017   Procedure: CORONARY ARTERY BYPASS GRAFTING (CABG), ON PUMP, TIMES FOUR, USING LEFT INTERNAL MAMMARY ARTERY AND ENDOSCOPICALLY HARVESTED RIGHT GREATER SAPHENOUS VEIN;  Surgeon: Kerin Perna, MD;  Location: [facility] OR;   Service: Open Heart Surgery;  Laterality: N/A;  LIMA to LAD; SVG to OM; SVG to Ramus; SVG to Right  . IR THORACENTESIS ASP PLEURAL SPACE W/IMG GUIDE  05/25/2017  . LEFT HEART CATH AND CORONARY ANGIOGRAPHY N/A 04/04/2017   Procedure: Left Heart Cath and Coronary Angiography;  Surgeon: Peter M [name], MD;  Location: [facility] INVASIVE CV LAB;  Service: Cardiovascular;  Laterality: N/A;  . MITRAL VALVE REPAIR N/A 04/12/2017   Procedure: MITRAL VALVE REPLACEMENT (MVR);  Surgeon: Donata Clay, Theron Arista, MD;  Location: [facility] OR;  Service: Open Heart Surgery;  Laterality: N/A;  Using a 25mm Magna Ease Mitral Pericardial Bioprosthesis  . TEE WITHOUT CARDIOVERSION N/A 04/12/2017   Procedure: TRANSESOPHAGEAL ECHOCARDIOGRAM (TEE);  Surgeon: Donata Clay, Theron Arista, MD;  Location: [facility] OR;  Service: Open Heart Surgery;  Laterality: N/A;  . TUBAL LIGATION       Current Outpatient Medications  Medication Sig Dispense Refill  . acetaminophen (TYLENOL) 325 MG tablet Take 2 tablets (650 mg total) by mouth every 6 (six) hours as needed for mild pain or headache.    Marland Kitchen aspirin EC 81 MG tablet Take 81 mg by mouth daily.    Marland Kitchen atorvastatin (LIPITOR) 20 MG tablet Take 1 tablet (20 mg total) by mouth daily at 6 PM. 30 tablet 6  . dapagliflozin propanediol (FARXIGA) 10 MG TABS tablet Take 10 mg by mouth daily.    . furosemide (LASIX) 40 MG tablet Take 2 tablets (80 mg total) by mouth daily. 90 tablet 3  . glipiZIDE (GLUCOTROL XL) 10 MG 24 hr tablet Take 1 tablet (10 mg total) by mouth daily with breakfast.    . losartan (COZAAR) 25 MG tablet Take 1 tablet (25 mg total) by mouth at bedtime. 30 tablet 3  . magnesium oxide (MAG-OX) 400 (241.3 Mg) MG tablet TAKE 1 TABLET BY MOUTH EVERY DAY 30 tablet 11  . metFORMIN (GLUCOPHAGE) 1000 MG tablet Take 1,000 mg by mouth 2 (two) times daily with a meal.    . metoprolol succinate (TOPROL XL) 25 MG 24 hr tablet Take 0.5 tablets (12.5 mg total) by mouth daily. 90 tablet 3  . saxagliptin HCl (ONGLYZA) 5 MG TABS  tablet Take 5 mg by mouth daily.    Marland Kitchen spironolactone (ALDACTONE) 25 MG tablet Take 0.5 tablets (12.5 mg total) by mouth daily. 45 tablet 3  . warfarin (COUMADIN) 2.5 MG tablet Take 1.5 to 2 tablets by mouth daily as directed by coumadin clinic 60 tablet 2   No current facility-administered medications for this visit.     Allergies:   Keflex [cephalexin] and Lisinopril    Social History:  The patient  reports that  has never smoked. she has never used smokeless tobacco. She reports that she does not drink alcohol or use drugs.   Family History:  The patient's family history includes Heart attack (age of onset: 77) in her father.    ROS:  Please see the history of present illness.   Otherwise,  review of systems are positive for none.   All other systems are reviewed and negative.    PHYSICAL EXAM: VS:  BP 102/62   Pulse 82   Ht 5\' 3"  (1.6 m)   Wt 180 lb 6.4 oz (81.8 kg)   BMI 31.96 kg/m  , BMI Body mass index is 31.96 kg/m. GENERAL:  Well appearing HEENT: Pupils equal round and reactive, fundi not visualized, oral mucosa unremarkable NECK:  No jugular venous distention, waveform within normal limits, carotid upstroke brisk and symmetric, no bruits, + thyromegaly LUNGS:  Clear to auscultation bilaterally CHEST: Well-healed midline sternotomy incision HEART:  RRR.  PMI not displaced or sustained,S1 and S2 within normal limits, no S3, no S4, no clicks, no rubs, no murmurs ABD:  Flat, positive bowel sounds normal in frequency in pitch, no bruits, no rebound, no guarding, no midline pulsatile mass, no hepatomegaly, no splenomegaly +Murphy's sign EXT:  2 plus pulses throughout, no edema, no cyanosis no clubbing SKIN:  No rashes no nodules NEURO:  Cranial nerves II through XII grossly intact, motor grossly intact throughout PSYCH:  Cognitively intact, oriented to person place and time   EKG:  EKG is ordered today. The ekg ordered 06/22/17 demonstrates sinus rhythm. Rate 91 bpm. Frequent  PVCs. Prior inferior infarct. Prior lateral infarct. 01/01/18: Sinus rhythm.  First degree heart block.  Non-specific T wave abnormalities.   48 hour Holter 10/08/17: Sinus rhythm with frequent PVCs (3%) and brief runs NSVT (3 beats). Appears to have 2 dominant PVC morphologies.   Echo 09/14/17: Study Conclusions  - Left ventricle: The cavity size was normal. Wall thickness was   normal. Systolic function was moderately reduced. The estimated   ejection fraction was in the range of 35% to 40%. Severe   hypokinesis of the inferolateral, inferior, and inferoseptal   myocardium; consistent with infarction in the distribution of the   right coronary and left circumflex coronary artery. Mitral   prosthesis precludes evaluation of LV diastolic function. There   is E-A fusion and pressure half time cannot be measured. - Ventricular septum: Septal motion showed paradox. These changes   are consistent with a post-thoracotomy state. - Mitral valve: A bioprosthesis was present. Transvalvular velocity   was increased more than expected. The findings are consistent   with moderate stenosis. Mean gradient (D): 10 mm Hg. Valve area   by continuity equation (using LVOT flow): 1.18 cm^2. - Left atrium: The atrium was mildly to moderately dilated. - Right ventricle: Systolic function was moderately reduced. - Right atrium: The atrium was mildly dilated.  Impressions:  - Mitral valve bioprosthesis gradients are lower than reported   earlier this year, but remain abnormal and are substantially   higher than the gradients seen on the intraoperative TEE (mean   gradient 4 mm Hg at 90 bpm). Repeat TEE may be helpful to   evaluate for leaflet thrombois.   Echo 05/24/17: Study Conclusions  - Left ventricle: The cavity size was normal. There was mild   concentric hypertrophy. Systolic function was moderately reduced.   The estimated ejection fraction was in the range of 35% to 40%.   There is  akinesis of the basal-midlateral and inferolateral   myocardium. There is akinesis of the basalinferior myocardium.   Due to tachycardia, there was fusion of early and atrial   contributions to ventricular filling. - Aortic valve: Trileaflet; normal thickness, mildly calcified   leaflets. Valve area (Vmax): 1.53 cm^2. - Mitral valve: There is a small  mobile target off of the mitral   valve prosthesis that likely represents residual native mitral   valve apparatus. A bioprosthesis was present and functioning   normally. There was trivial regurgitation. - Pulmonary arteries: PA peak pressure: 33 mm Hg (S). - Pericardium, extracardiac: There was a large left pleural   effusion.  LHC 04/04/17:  Prox LAD lesion, 75 %stenosed.  Prox LAD to Mid LAD lesion, 65 %stenosed.  Mid LAD to Dist LAD lesion, 55 %stenosed.  Ramus lesion, 90 %stenosed.  Lat Ramus lesion, 50 %stenosed.  Prox Cx lesion, 100 %stenosed.  Prox RCA to Mid RCA lesion, 95 %stenosed.  There is moderate left ventricular systolic dysfunction.  LV end diastolic pressure is mildly elevated.  The left ventricular ejection fraction is 35-45% by visual estimate.  There is mild (2+) mitral regurgitation.   1. Severe 3 vessel obstructive CAD 2. Moderate LV dysfunction 3. Mildly elevated LV EDP  Recent Labs: 05/23/2017: ALT 23; B Natriuretic Peptide 850.4; TSH 0.490 05/25/2017: Magnesium 2.4 06/29/2017: Hemoglobin 11.3; Platelets 158 11/05/2017: BUN 33; Creatinine, Ser 1.53; Potassium 4.0; Sodium 139    Lipid Panel    Component Value Date/Time   CHOL 168 04/06/2017 0243   TRIG 67 04/06/2017 0243   HDL 48 04/06/2017 0243   CHOLHDL 3.5 04/06/2017 0243   VLDL 13 04/06/2017 0243   LDLCALC 107 (H) 04/06/2017 0243      Wt Readings from Last 3 Encounters:  01/01/18 180 lb 6.4 oz (81.8 kg)  11/05/17 177 lb (80.3 kg)  10/22/17 177 lb 4 oz (80.4 kg)      ASSESSMENT AND PLAN:  # Chest pain: [name] has atypical  chest pain that is improving with time.  It is not exertional.  This is post-op surgical discomfort that should continue to improve.  # PVCs: Mildly symptomatic.  Unable to titrate metoprolol due to low BP and first degree heart block.   # RUQ pain:  Concerning for choledocholithiasis.  We will get a right upper quadrant ultrasound, CBC, CMP, amylase, and lipase.  She is scheduled to see her primary care provider later this week.  # Chronic systolic and diastolic heart failure:  LVEF 16-10%.  [name] is doing well and is euvolemic.  She has been following up in the heart failure clinic as well.  Continue losartan, metoprolol, and spironolactone.  # Paroxysmal atrial fibrillaton:  No recent episodes.  Start metoprolol.  Continue warfarin.  # CAD s/p CABG: Her chest pain is not cardiac.  She has discomfort from her surgical incision that is improving.  Continue aspirin, atorvastatin, and metoprolol.  # Thyromegaly:  Chronic.  Thyroid function stable 05/2017.   Current medicines are reviewed at length with the patient today.  The patient does not have concerns regarding medicines.  The following changes have been made:  Stop carvedilol.  Start metprolol and Keflex.   Labs/ tests ordered today include:   No orders of the defined types were placed in this encounter.    Disposition:   FU with Lynae Pederson C. Duke Salvia, MD, [facility] in 1 year.  She was offered to follow-up with the heart failure clinic exclusively but wants to continue at least annual follow-up with me.   This note was written with the assistance of speech recognition software.  Please excuse any transcriptional errors.  Signed, Tyquan Carmickle C. Duke Salvia, MD, [facility]  01/01/2018 8:42 AM    [facility]

## 2018-01-01 NOTE — Patient Instructions (Signed)
Description   Take 3 of the 2.5 mg tablets (7.5 mg total) today Tuesday Jan 29, then take 2 tablets daily except 1.5 tablets each Monday and Friday.  WHEN YOU RUN OUT OF THE 2.5 MG TABLETS, SWITCH TO 5 MG (ORANGE) TABLETS AND TAKE 1 TABLET DAILY EXCEPT 1/2 TABLET EACH MONDAY.  Repeat INR in 2 weeks

## 2018-01-02 ENCOUNTER — Other Ambulatory Visit: Payer: 59

## 2018-01-02 ENCOUNTER — Encounter: Payer: Self-pay | Admitting: Cardiovascular Disease

## 2018-01-08 ENCOUNTER — Ambulatory Visit
Admission: RE | Admit: 2018-01-08 | Discharge: 2018-01-08 | Disposition: A | Discharge status: Home / Self Care | Payer: 59 | Source: Ambulatory Visit | Attending: Cardiovascular Disease | Admitting: Cardiovascular Disease

## 2018-01-08 DIAGNOSIS — R1011 Right upper quadrant pain: Secondary | ICD-10-CM

## 2018-01-17 ENCOUNTER — Ambulatory Visit (INDEPENDENT_AMBULATORY_CARE_PROVIDER_SITE_OTHER): Payer: 59 | Admitting: Pharmacist

## 2018-01-17 DIAGNOSIS — Z7901 Long term (current) use of anticoagulants: Secondary | ICD-10-CM | POA: Diagnosis not present

## 2018-01-17 DIAGNOSIS — Z951 Presence of aortocoronary bypass graft: Secondary | ICD-10-CM | POA: Diagnosis not present

## 2018-01-17 DIAGNOSIS — I4891 Unspecified atrial fibrillation: Secondary | ICD-10-CM

## 2018-01-17 LAB — POCT INR: INR: 1.5

## 2018-01-17 NOTE — Patient Instructions (Signed)
Take 3 of the 2.5 mg tablets (7.5 mg total) today 01/17/18; then increase dose to  2 tablets daily except for 3 tablets every Friday  * WHEN YOU RUN OUT OF THE 2.5 MG TABLETS, SWITCH TO 5 MG (ORANGE) TABLETS AND TAKE 1 TABLET DAILY EXCEPT 1 and 1/2 TABLET EACH FRIDAY.  Repeat INR in 2 weeks

## 2018-02-06 ENCOUNTER — Ambulatory Visit (HOSPITAL_BASED_OUTPATIENT_CLINIC_OR_DEPARTMENT_OTHER)
Admission: RE | Admit: 2018-02-06 | Discharge: 2018-02-06 | Disposition: A | Discharge status: Home / Self Care | Payer: 59 | Source: Ambulatory Visit | Attending: Internal Medicine | Admitting: Internal Medicine

## 2018-02-06 ENCOUNTER — Encounter (HOSPITAL_COMMUNITY): Payer: Self-pay | Admitting: Internal Medicine

## 2018-02-06 ENCOUNTER — Ambulatory Visit (INDEPENDENT_AMBULATORY_CARE_PROVIDER_SITE_OTHER): Payer: 59 | Admitting: Pharmacist

## 2018-02-06 ENCOUNTER — Ambulatory Visit (HOSPITAL_COMMUNITY)
Admission: RE | Admit: 2018-02-06 | Discharge: 2018-02-06 | Disposition: A | Discharge status: Home / Self Care | Payer: 59 | Source: Ambulatory Visit | Attending: Internal Medicine | Admitting: Internal Medicine

## 2018-02-06 VITALS — BP 122/80 | HR 72 | Wt 178.8 lb

## 2018-02-06 DIAGNOSIS — Z7984 Long term (current) use of oral hypoglycemic drugs: Secondary | ICD-10-CM | POA: Diagnosis not present

## 2018-02-06 DIAGNOSIS — Z951 Presence of aortocoronary bypass graft: Secondary | ICD-10-CM

## 2018-02-06 DIAGNOSIS — I110 Hypertensive heart disease with heart failure: Secondary | ICD-10-CM | POA: Diagnosis not present

## 2018-02-06 DIAGNOSIS — R002 Palpitations: Secondary | ICD-10-CM | POA: Diagnosis not present

## 2018-02-06 DIAGNOSIS — I480 Paroxysmal atrial fibrillation: Secondary | ICD-10-CM | POA: Insufficient documentation

## 2018-02-06 DIAGNOSIS — I5022 Chronic systolic (congestive) heart failure: Secondary | ICD-10-CM | POA: Diagnosis not present

## 2018-02-06 DIAGNOSIS — I252 Old myocardial infarction: Secondary | ICD-10-CM | POA: Insufficient documentation

## 2018-02-06 DIAGNOSIS — Z953 Presence of xenogenic heart valve: Secondary | ICD-10-CM | POA: Insufficient documentation

## 2018-02-06 DIAGNOSIS — Z7901 Long term (current) use of anticoagulants: Secondary | ICD-10-CM | POA: Insufficient documentation

## 2018-02-06 DIAGNOSIS — I2510 Atherosclerotic heart disease of native coronary artery without angina pectoris: Secondary | ICD-10-CM | POA: Insufficient documentation

## 2018-02-06 DIAGNOSIS — E119 Type 2 diabetes mellitus without complications: Secondary | ICD-10-CM | POA: Diagnosis not present

## 2018-02-06 DIAGNOSIS — I493 Ventricular premature depolarization: Secondary | ICD-10-CM | POA: Diagnosis not present

## 2018-02-06 DIAGNOSIS — Z7982 Long term (current) use of aspirin: Secondary | ICD-10-CM | POA: Insufficient documentation

## 2018-02-06 DIAGNOSIS — I5043 Acute on chronic combined systolic (congestive) and diastolic (congestive) heart failure: Secondary | ICD-10-CM | POA: Diagnosis not present

## 2018-02-06 DIAGNOSIS — I4891 Unspecified atrial fibrillation: Secondary | ICD-10-CM

## 2018-02-06 DIAGNOSIS — I340 Nonrheumatic mitral (valve) insufficiency: Secondary | ICD-10-CM | POA: Diagnosis not present

## 2018-02-06 DIAGNOSIS — I255 Ischemic cardiomyopathy: Secondary | ICD-10-CM | POA: Diagnosis not present

## 2018-02-06 DIAGNOSIS — E785 Hyperlipidemia, unspecified: Secondary | ICD-10-CM | POA: Insufficient documentation

## 2018-02-06 DIAGNOSIS — Z79899 Other long term (current) drug therapy: Secondary | ICD-10-CM | POA: Insufficient documentation

## 2018-02-06 LAB — POCT INR: INR: 2.2

## 2018-02-06 MED ORDER — FUROSEMIDE 40 MG PO TABS: 80.0000 mg | ORAL_TABLET | ORAL | 3 refills | Status: DC

## 2018-02-06 MED ORDER — SACUBITRIL-VALSARTAN 24-26 MG PO TABS: 1.0000 | ORAL_TABLET | Freq: Two times a day (BID) | ORAL | 3 refills | Status: DC

## 2018-02-06 NOTE — Patient Instructions (Signed)
Stop Losartan   Start Entresto 24/26 mg Twice daily   Change Furosemide (Lasix) to 80 mg ONLY ON MON, WED AND FRI, can hold as needed  Labs in 2 weeks  Your physician has recommended that you wear a holter monitor. Holter monitors are medical devices that record the heart's electrical activity. Doctors most often use these monitors to diagnose arrhythmias. Arrhythmias are problems with the speed or rhythm of the heartbeat. The monitor is a small, portable device. You can wear one while you do your normal daily activities. This is usually used to diagnose what is causing palpitations/syncope (passing out).  Your physician recommends that you schedule a follow-up appointment in: 3-4 months

## 2018-02-06 NOTE — Patient Instructions (Signed)
Continue taking 2 tablets daily except for 3 tablets every Friday  * WHEN YOU RUN OUT OF THE 2.5 MG TABLETS, SWITCH TO 5 MG (ORANGE) TABLETS AND TAKE 1 TABLET DAILY EXCEPT 1 and 1/2 TABLET EACH FRIDAY.  Repeat INR in 4 weeks

## 2018-02-06 NOTE — Progress Notes (Signed)
PCP: Primary Cardiologist: Dr Gala Romney   HPI: Rachael Davis is a 62 year old with a h/o CAD S/P CABG x4 and MVR 04/12/2017, sinus tach, PVCs, and chronic systolic heart failure.    Admitted 6/20 though 05/28/2017 with volume overload and pleural effusion. Diuresed with IV lasix and transitioned to lasix 40 mg twice a day. Also had lots of PVCs so started on amiodarone. Discharge weight was 177 pounds.   10/2017 Holter monitor- 3% PVC burden.   She returns today for HF follow up. Overall feeling okay. Her metoprolol was decreased for long PR and her lasix was decreased at last visit.  She continues to have palpitations, especially at work. She works in Clinical biochemist full time. SOB sometimes after a 10 minute walk. C/o CP, but sounds more like breast pain. No orthopnea or PND. Weights at home 178-180 lbs. Compliant with all medications.   Echo reviewed by Dr. Gala Romney. EF ~40% with mean gradient of 15 over mitral valve (Pressure half-time not measured but visually does not appear to be severe)    ECHO 05/11/2017 - Left ventricle: The cavity size was moderately dilated. Wall thickness was increased in a pattern of mild LVH. Systolic function was moderately reduced. The estimated ejection fraction was in the range of 35% to 40%. Diffuse hypokinesis. The study is not technically sufficient to allow evaluation of LV diastolic function. - Mitral valve: Normal appearing bioprosthetic MV no perivalvular regurgitation. - Left atrium: The atrium was mildly dilated. - Atrial septum: No defect or patent foramen ovale was identified. - Pulmonary arteries: PA peak pressure: 35 mm Hg (S).  LHC 04/04/2017   Prox LAD lesion, 75 %stenosed.  Prox LAD to Mid LAD lesion, 65 %stenosed.  Mid LAD to Dist LAD lesion, 55 %stenosed.  Ramus lesion, 90 %stenosed.  Lat Ramus lesion, 50 %stenosed.  Prox Cx lesion, 100 %stenosed.  Prox RCA to Mid RCA lesion, 95 %stenosed.  There is moderate  left ventricular systolic dysfunction.  LV end diastolic pressure is mildly elevated.  The left ventricular ejection fraction is 35-45% by visual estimate.  There is mild (2+) mitral regurgitation. 1. Severe 3 vessel obstructive CAD 2. Moderate LV dysfunction 3. Mildly elevated LV EDP ROS: All systems negative except as listed in HPI, PMH and Problem List.  SH:  Social History   Socioeconomic History  . Marital status: Married    Spouse name: Not on file  . Number of children: Not on file  . Years of education: Not on file  . Highest education level: Not on file  Social Needs  . Financial resource strain: Not on file  . Food insecurity - worry: Not on file  . Food insecurity - inability: Not on file  . Transportation needs - medical: Not on file  . Transportation needs - non-medical: Not on file  Occupational History  . Not on file  Tobacco Use  . Smoking status: Never Smoker  . Smokeless tobacco: Never Used  Substance and Sexual Activity  . Alcohol use: No  . Drug use: No  . Sexual activity: Not on file  Other Topics Concern  . Not on file  Social History Narrative  . Not on file    FH:  Family History  Problem Relation Age of Onset  . Heart attack Father 69    Past Medical History:  Diagnosis Date  . Arrhythmia    Hx PAF, PVC's  . CHF (congestive heart failure) (HCC)   . Coronary artery  disease    04/04/2017 STEMI  . Diabetes (HCC)   . Hyperlipidemia   . Hypertension   . New onset atrial fibrillation (HCC) 05/2017    Current Outpatient Medications  Medication Sig Dispense Refill  . acetaminophen (TYLENOL) 325 MG tablet Take 2 tablets (650 mg total) by mouth every 6 (six) hours as needed for mild pain or headache.    Marland Kitchen aspirin EC 81 MG tablet Take 81 mg by mouth daily.    Marland Kitchen atorvastatin (LIPITOR) 20 MG tablet Take 1 tablet (20 mg total) by mouth daily at 6 PM. 30 tablet 6  . dapagliflozin propanediol (FARXIGA) 10 MG TABS tablet Take 10 mg by mouth  daily.    . furosemide (LASIX) 40 MG tablet Take 2 tablets (80 mg total) by mouth daily. 90 tablet 3  . glipiZIDE (GLUCOTROL XL) 10 MG 24 hr tablet Take 1 tablet (10 mg total) by mouth daily with breakfast.    . losartan (COZAAR) 25 MG tablet Take 1 tablet (25 mg total) by mouth at bedtime. 30 tablet 3  . magnesium oxide (MAG-OX) 400 (241.3 Mg) MG tablet TAKE 1 TABLET BY MOUTH EVERY DAY 30 tablet 11  . metFORMIN (GLUCOPHAGE) 1000 MG tablet Take 1,000 mg by mouth 2 (two) times daily with a meal.    . metoprolol succinate (TOPROL XL) 25 MG 24 hr tablet Take 0.5 tablets (12.5 mg total) by mouth daily. 90 tablet 3  . saxagliptin HCl (ONGLYZA) 5 MG TABS tablet Take 5 mg by mouth daily.    Marland Kitchen spironolactone (ALDACTONE) 25 MG tablet Take 0.5 tablets (12.5 mg total) by mouth daily. 45 tablet 3  . warfarin (COUMADIN) 5 MG tablet Take 1/2 to 1 tablet by mouth daily as directed by the coumadin clinic 90 tablet 1   No current facility-administered medications for this encounter.     Vitals:   02/06/18 1124  BP: 122/80  Pulse: 72  SpO2: 97%  Weight: 178 lb 12.8 oz (81.1 kg)     PHYSICAL EXAM: General: Well appearing. No resp difficulty. HEENT: Normal Neck: Supple. No JVD. Carotids 2+ bilat; no bruits. No thyromegaly or nodule noted. Cor: PMI nondisplaced. RRR, 2/6 murmur LUSB and RUSB Lungs: CTAB, normal effort. Abdomen: Soft, non-tender, non-distended, no HSM. No bruits or masses. +BS  Extremities: No cyanosis, clubbing, or rash. R and LLE trace ankle edema Neuro: Alert & orientedx3, cranial nerves grossly intact. moves all 4 extremities w/o difficulty. Affect pleasant  ASSESSMENT & PLAN:  1.Chronic Systolic Heart Failure: Ischemic cardiomyopathy, ECHO 05/24/2017 EF 35-40%. Echo 02/06/2018: EF ~40% with mean gradient of 15 over mitral valve. Reviewed by Dr. Gala Romney -NYHA II. Volume status stable - Continue lasix 80 mg daily.  - Continue Spiro 12.5 mg hs - Continue losartan to 25 mg hs.  -  Cut back metoprolol XL  To 12.5 mg daily with long PR interval. PR interval increased since October.   - BMET today.    2. CAD S/P CABG x4: - Denies chest pain - Continue statin and ASA.  3. Bioprosthetic MVR:  - Stable, continue warfarin.  - No s/s bleeding  4. PVCs:  - Amio stopped per PVT. Holter monitor 11/18 with 3 % PVC burned. I discussed Holter monitor results.   Hold off on restarting amio.  - Instructed to cut back caffeine intake.    5. PAF - Regular on exam.   Repeat ECHO.   Follow up in 3 months. Check BMET today.   Greater than  50% of the (total minutes 25) visit spent in counseling/coordination of care regarding the above.     Alford Highland NP-C 11:43 AM  Patient seen and examined with the above-signed Advanced Practice Provider and/or Housestaff. I personally reviewed laboratory data, imaging studies and relevant notes. I independently examined the patient and formulated the important aspects of the plan. I have edited the note to reflect any of my changes or salient points. I have personally discussed the plan with the patient and/or family.  Doing very well. NYHA II. Volume status ok. Echo reviewed personally EF 40%. Has probable mild to moderate MS. Feels like palpitations are much worse since Holter in 11/18. Will repeat monitor. B-blocker dose limited by marked 1st AVB. May need PPM at some point. Will switch losartan to Entresto 24/26. Watch for dizziness. Check labs today. Reminded about need for SBE prophylaxis.   Arvilla Meres, MD  12:22 PM

## 2018-02-06 NOTE — Addendum Note (Signed)
Encounter addended by: Noralee Space, RN on: 02/06/2018 12:33 PM  Actions taken: Visit diagnoses modified, Medication long-term status modified, Order list changed, Diagnosis association updated, Sign clinical note

## 2018-02-06 NOTE — Progress Notes (Signed)
PCP: Primary Cardiologist: Dr Gala Romney   HPI: Rachael Davis is a 62 year old with a h/o CAD S/P CABG x4 and MVR 04/12/2017, sinus tach, PVCs, and chronic systolic heart failure.    Admitted 6/20 though 05/28/2017 with volume overload and pleural effusion. Diuresed with IV lasix and transitioned to lasix 40 mg twice a day. Also had lots of PVCs so started on amiodarone. Discharge weight was 177 pounds.   10/2017 Holter monitor- 3% PVC burden.   She returns for HF follow up. Working [employer]. At work walks 10 min two times a day on breaks. Overall feeling fine. Still having palpitations. Drinking a few cups of coffee a day. Denies SOB/PND/Orthopnea/CP. Appetite ok. No fever or chills. Weight at home 173-177 pounds. No bleeding problems. Completing cardiac rehab this week. Taking all medications    ECHO 05/11/2017 - Left ventricle: The cavity size was moderately dilated. Wall thickness was increased in a pattern of mild LVH. Systolic function was moderately reduced. The estimated ejection fraction was in the range of 35% to 40%. Diffuse hypokinesis. The study is not technically sufficient to allow evaluation of LV diastolic function. - Mitral valve: Normal appearing bioprosthetic MV no perivalvular regurgitation. - Left atrium: The atrium was mildly dilated. - Atrial septum: No defect or patent foramen ovale was identified. - Pulmonary arteries: PA peak pressure: 35 mm Hg (S).  LHC 04/04/2017   Prox LAD lesion, 75 %stenosed.  Prox LAD to Mid LAD lesion, 65 %stenosed.  Mid LAD to Dist LAD lesion, 55 %stenosed.  Ramus lesion, 90 %stenosed.  Lat Ramus lesion, 50 %stenosed.  Prox Cx lesion, 100 %stenosed.  Prox RCA to Mid RCA lesion, 95 %stenosed.  There is moderate left ventricular systolic dysfunction.  LV end diastolic pressure is mildly elevated.  The left ventricular ejection fraction is 35-45% by visual estimate.  There is mild (2+) mitral regurgitation. 1.  Severe 3 vessel obstructive CAD 2. Moderate LV dysfunction 3. Mildly elevated LV EDP ROS: All systems negative except as listed in HPI, PMH and Problem List.  SH:  Social History   Socioeconomic History  . Marital status: Married    Spouse name: Not on file  . Number of children: Not on file  . Years of education: Not on file  . Highest education level: Not on file  Social Needs  . Financial resource strain: Not on file  . Food insecurity - worry: Not on file  . Food insecurity - inability: Not on file  . Transportation needs - medical: Not on file  . Transportation needs - non-medical: Not on file  Occupational History  . Not on file  Tobacco Use  . Smoking status: Never Smoker  . Smokeless tobacco: Never Used  Substance and Sexual Activity  . Alcohol use: No  . Drug use: No  . Sexual activity: Not on file  Other Topics Concern  . Not on file  Social History Narrative  . Not on file    FH:  Family History  Problem Relation Age of Onset  . Heart attack Father 40    Past Medical History:  Diagnosis Date  . Arrhythmia    Hx PAF, PVC's  . CHF (congestive heart failure) (HCC)   . Coronary artery disease    04/04/2017 STEMI  . Diabetes (HCC)   . Hyperlipidemia   . Hypertension   . New onset atrial fibrillation (HCC) 05/2017    Current Outpatient Medications  Medication Sig Dispense Refill  . acetaminophen (  TYLENOL) 325 MG tablet Take 2 tablets (650 mg total) by mouth every 6 (six) hours as needed for mild pain or headache.    Marland Kitchen aspirin EC 81 MG tablet Take 81 mg by mouth daily.    Marland Kitchen atorvastatin (LIPITOR) 20 MG tablet Take 1 tablet (20 mg total) by mouth daily at 6 PM. 30 tablet 6  . dapagliflozin propanediol (FARXIGA) 10 MG TABS tablet Take 10 mg by mouth daily.    . furosemide (LASIX) 40 MG tablet Take 2 tablets (80 mg total) by mouth daily. 90 tablet 3  . glipiZIDE (GLUCOTROL XL) 10 MG 24 hr tablet Take 1 tablet (10 mg total) by mouth daily with breakfast.      . losartan (COZAAR) 25 MG tablet Take 1 tablet (25 mg total) by mouth at bedtime. 30 tablet 3  . magnesium oxide (MAG-OX) 400 (241.3 Mg) MG tablet TAKE 1 TABLET BY MOUTH EVERY DAY 30 tablet 11  . metFORMIN (GLUCOPHAGE) 1000 MG tablet Take 1,000 mg by mouth 2 (two) times daily with a meal.    . metoprolol succinate (TOPROL XL) 25 MG 24 hr tablet Take 0.5 tablets (12.5 mg total) by mouth daily. 90 tablet 3  . saxagliptin HCl (ONGLYZA) 5 MG TABS tablet Take 5 mg by mouth daily.    Marland Kitchen spironolactone (ALDACTONE) 25 MG tablet Take 0.5 tablets (12.5 mg total) by mouth daily. 45 tablet 3  . warfarin (COUMADIN) 5 MG tablet Take 1/2 to 1 tablet by mouth daily as directed by the coumadin clinic 90 tablet 1   No current facility-administered medications for this encounter.     Vitals:   02/06/18 1124  BP: 122/80  Pulse: 72  SpO2: 97%  Weight: 178 lb 12.8 oz (81.1 kg)     PHYSICAL EXAM: General:  Well appearing. No resp difficulty. Walked in the clinic without difficulty.  HEENT: normal Neck: supple. no JVD. Carotids 2+ bilat; no bruits. No lymphadenopathy or thryomegaly appreciated. Cor: PMI nondisplaced. Regular rate & rhythm. No rubs, gallops or murmurs. Lungs: clear Abdomen: soft, nontender, nondistended. No hepatosplenomegaly. No bruits or masses. Good bowel sounds. Extremities: no cyanosis, clubbing, rash, edema Neuro: alert & orientedx3, cranial nerves grossly intact. moves all 4 extremities w/o difficulty. Affect pleasant  EKG: NSR 81 bpm 1st degree heart block 290 ms. Rare PVCs.   ASSESSMENT & PLAN:  1.Chronic Systolic Heart Failure: Ischemic cardiomyopathy, ECHO 05/24/2017 EF 35-40%.  -NYHA II.Volume status low. Hold lasix today. - On 12/4 start lasix 80 mg daily. She will stop lasix in the evening.  - Continue Spiro 12.5 mg hs -Continue losartan to 25 mg hs.  - Cut back metoprolol XL  To 12.5 mg daily with long PR interval. PR interval increased since October.   -BMET today.     2. CAD S/P CABG x4: - Denies chest pain - Continue statin and ASA.  3. Bioprosthetic MVR:  - Stable, continue warfarin.   4. PVCs:  - Amio stopped per PVT. Holter monitor 11/5 with 3 % PVC burned. I discussed Holter monitor results.   Hold off on restarting amio.  - Instructed to cut back caffeine intake.    5. PAF - In NSR. Continue warfarin.   Duwaine Maxin, NP-C  Patient seen and examined with the above-signed Advanced Practice Provider and/or Housestaff. I personally reviewed laboratory data, imaging studies and relevant notes. I independently examined the patient and formulated the important aspects of the plan. I have edited the note to reflect any  of my changes or salient points. I have personally discussed the plan with the patient and/or family.  Doing very well. NYHA II. Volume status ok. Echo reviewed personally EF 40%. Has probable mild to moderate MS. Feels like palpitations are much worse since Holter in 11/18. Will repeat monitor. B-blocker dose limited by marked 1st AVB. May need PPM at some point. Will switch losartan to Entresto 24/26. Watch for dizziness. Check labs today. Reminded about need for SBE prophylaxis.   Arvilla Meres, MD  12:22 PM

## 2018-02-06 NOTE — Progress Notes (Signed)
  Echocardiogram 2D Echocardiogram has been performed.  Rachael Davis 02/06/2018, 10:46 AM

## 2018-02-20 ENCOUNTER — Ambulatory Visit (HOSPITAL_COMMUNITY)
Admission: RE | Admit: 2018-02-20 | Discharge: 2018-02-20 | Disposition: A | Discharge status: Home / Self Care | Payer: 59 | Source: Ambulatory Visit | Attending: Cardiology | Admitting: Cardiology

## 2018-02-20 DIAGNOSIS — I5022 Chronic systolic (congestive) heart failure: Secondary | ICD-10-CM | POA: Diagnosis not present

## 2018-02-20 LAB — BASIC METABOLIC PANEL
Anion gap: 7 (ref 5–15)
BUN: 30 mg/dL — ABNORMAL HIGH (ref 6–20)
CO2: 26 mmol/L (ref 22–32)
Calcium: 9.7 mg/dL (ref 8.9–10.3)
Chloride: 105 mmol/L (ref 101–111)
Creatinine, Ser: 1.51 mg/dL — ABNORMAL HIGH (ref 0.44–1.00)
GFR calc Af Amer: 42 mL/min — ABNORMAL LOW (ref >60)
GFR calc non Af Amer: 36 mL/min — ABNORMAL LOW (ref >60)
Glucose, Bld: 74 mg/dL (ref 65–99)
Potassium: 4.8 mmol/L (ref 3.5–5.1)
Sodium: 138 mmol/L (ref 135–145)

## 2018-03-04 ENCOUNTER — Ambulatory Visit (INDEPENDENT_AMBULATORY_CARE_PROVIDER_SITE_OTHER): Payer: 59

## 2018-03-04 DIAGNOSIS — R002 Palpitations: Secondary | ICD-10-CM | POA: Diagnosis not present

## 2018-03-08 ENCOUNTER — Ambulatory Visit (INDEPENDENT_AMBULATORY_CARE_PROVIDER_SITE_OTHER): Payer: 59 | Admitting: Pharmacist Clinician (PhC)/ Clinical Pharmacy Specialist

## 2018-03-08 DIAGNOSIS — I4891 Unspecified atrial fibrillation: Secondary | ICD-10-CM | POA: Diagnosis not present

## 2018-03-08 DIAGNOSIS — Z951 Presence of aortocoronary bypass graft: Secondary | ICD-10-CM | POA: Diagnosis not present

## 2018-03-08 DIAGNOSIS — Z7901 Long term (current) use of anticoagulants: Secondary | ICD-10-CM

## 2018-03-08 LAB — POCT INR: INR: 1.9

## 2018-03-08 NOTE — Patient Instructions (Signed)
Description   Continue with 1 tablet daily except 1.5 tablets each Friday.  Repeat INR in 4 weeks

## 2018-04-05 ENCOUNTER — Ambulatory Visit (INDEPENDENT_AMBULATORY_CARE_PROVIDER_SITE_OTHER): Payer: 59 | Admitting: Pharmacist Clinician (PhC)/ Clinical Pharmacy Specialist

## 2018-04-05 DIAGNOSIS — I4891 Unspecified atrial fibrillation: Secondary | ICD-10-CM | POA: Diagnosis not present

## 2018-04-05 DIAGNOSIS — Z951 Presence of aortocoronary bypass graft: Secondary | ICD-10-CM | POA: Diagnosis not present

## 2018-04-05 DIAGNOSIS — Z7901 Long term (current) use of anticoagulants: Secondary | ICD-10-CM | POA: Diagnosis not present

## 2018-04-05 LAB — POCT INR: INR: 2.3

## 2018-05-06 ENCOUNTER — Ambulatory Visit (INDEPENDENT_AMBULATORY_CARE_PROVIDER_SITE_OTHER): Payer: 59 | Admitting: Pharmacist

## 2018-05-06 DIAGNOSIS — I4891 Unspecified atrial fibrillation: Secondary | ICD-10-CM

## 2018-05-06 DIAGNOSIS — Z951 Presence of aortocoronary bypass graft: Secondary | ICD-10-CM

## 2018-05-06 DIAGNOSIS — Z7901 Long term (current) use of anticoagulants: Secondary | ICD-10-CM | POA: Diagnosis not present

## 2018-05-06 LAB — POCT INR: INR: 2.8 (ref 2.0–3.0)

## 2018-05-09 ENCOUNTER — Other Ambulatory Visit: Payer: Self-pay

## 2018-05-09 ENCOUNTER — Ambulatory Visit (HOSPITAL_COMMUNITY)
Admission: RE | Admit: 2018-05-09 | Discharge: 2018-05-09 | Disposition: A | Discharge status: Home / Self Care | Payer: 59 | Source: Ambulatory Visit | Attending: Internal Medicine | Admitting: Internal Medicine

## 2018-05-09 ENCOUNTER — Encounter (HOSPITAL_COMMUNITY): Payer: Self-pay

## 2018-05-09 VITALS — BP 108/70 | HR 90 | Wt 187.4 lb

## 2018-05-09 DIAGNOSIS — Z79899 Other long term (current) drug therapy: Secondary | ICD-10-CM | POA: Diagnosis not present

## 2018-05-09 DIAGNOSIS — I2510 Atherosclerotic heart disease of native coronary artery without angina pectoris: Secondary | ICD-10-CM | POA: Diagnosis not present

## 2018-05-09 DIAGNOSIS — Z951 Presence of aortocoronary bypass graft: Secondary | ICD-10-CM | POA: Insufficient documentation

## 2018-05-09 DIAGNOSIS — I480 Paroxysmal atrial fibrillation: Secondary | ICD-10-CM | POA: Diagnosis not present

## 2018-05-09 DIAGNOSIS — E785 Hyperlipidemia, unspecified: Secondary | ICD-10-CM | POA: Insufficient documentation

## 2018-05-09 DIAGNOSIS — Z7982 Long term (current) use of aspirin: Secondary | ICD-10-CM | POA: Insufficient documentation

## 2018-05-09 DIAGNOSIS — Z953 Presence of xenogenic heart valve: Secondary | ICD-10-CM | POA: Diagnosis not present

## 2018-05-09 DIAGNOSIS — I5022 Chronic systolic (congestive) heart failure: Secondary | ICD-10-CM | POA: Diagnosis not present

## 2018-05-09 DIAGNOSIS — I493 Ventricular premature depolarization: Secondary | ICD-10-CM | POA: Diagnosis not present

## 2018-05-09 DIAGNOSIS — Z7984 Long term (current) use of oral hypoglycemic drugs: Secondary | ICD-10-CM | POA: Insufficient documentation

## 2018-05-09 DIAGNOSIS — E119 Type 2 diabetes mellitus without complications: Secondary | ICD-10-CM | POA: Insufficient documentation

## 2018-05-09 DIAGNOSIS — I110 Hypertensive heart disease with heart failure: Secondary | ICD-10-CM | POA: Diagnosis present

## 2018-05-09 DIAGNOSIS — I440 Atrioventricular block, first degree: Secondary | ICD-10-CM | POA: Diagnosis not present

## 2018-05-09 DIAGNOSIS — I5043 Acute on chronic combined systolic (congestive) and diastolic (congestive) heart failure: Secondary | ICD-10-CM

## 2018-05-09 DIAGNOSIS — Z7901 Long term (current) use of anticoagulants: Secondary | ICD-10-CM | POA: Insufficient documentation

## 2018-05-09 LAB — BASIC METABOLIC PANEL
Anion gap: 8 (ref 5–15)
BUN: 22 mg/dL — ABNORMAL HIGH (ref 6–20)
CO2: 24 mmol/L (ref 22–32)
Calcium: 9.3 mg/dL (ref 8.9–10.3)
Chloride: 107 mmol/L (ref 101–111)
Creatinine, Ser: 1.22 mg/dL — ABNORMAL HIGH (ref 0.44–1.00)
GFR calc Af Amer: 53 mL/min — ABNORMAL LOW (ref >60)
GFR calc non Af Amer: 46 mL/min — ABNORMAL LOW (ref >60)
Glucose, Bld: 112 mg/dL — ABNORMAL HIGH (ref 65–99)
Potassium: 4.5 mmol/L (ref 3.5–5.1)
Sodium: 139 mmol/L (ref 135–145)

## 2018-05-09 MED ORDER — SACUBITRIL-VALSARTAN 49-51 MG PO TABS: 1.0000 | ORAL_TABLET | Freq: Two times a day (BID) | ORAL | 3 refills | Status: DC

## 2018-05-09 NOTE — Patient Instructions (Addendum)
INCREASE Entresto to 49/51mg  twice daily.  STOP Metoprolol.   Routine lab work today. Will notify you of abnormal results  Repeat labs in 10 days.  Follow up in 3 months.

## 2018-05-09 NOTE — Progress Notes (Signed)
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    Admitted 6/20 though 05/28/2017 with volume overload and pleural effusion. Diuresed with IV lasix and transitioned to lasix 40 mg twice a day. Also had lots of PVCs so started on amiodarone. Discharge weight was 177 pounds.   She returns for HF follow up. Overall feeling fine. Denies PND/Orthopnea. Mild SOB with inclines. Appetite ok. She has not been monitoring what she is eating. Not on a specific diet. No fever or chills. She has not been weighing at home. Taking all medications. Continues to work full time in [employer].   03/2018 Holter Monitor-4% PVC  Marked 1st degree AV block  10/2017 Holter monitor- 3% PVC burden.    ECHO 05/11/2017 - Left ventricle: The cavity size was moderately dilated. Wall thickness was increased in a pattern of mild LVH. Systolic function was moderately reduced. The estimated ejection fraction was in the range of 35% to 40%. Diffuse hypokinesis. The study is not technically sufficient to allow evaluation of LV diastolic function. - Mitral valve: Normal appearing bioprosthetic MV no perivalvular regurgitation. - Left atrium: The atrium was mildly dilated. - Atrial septum: No defect or patent foramen ovale was identified. - Pulmonary arteries: PA peak pressure: 35 mm Hg (S).  LHC 04/04/2017   Prox LAD lesion, 75 %stenosed.  Prox LAD to Mid LAD lesion, 65 %stenosed.  Mid LAD to Dist LAD lesion, 55 %stenosed.  Ramus lesion, 90 %stenosed.  Lat Ramus lesion, 50 %stenosed.  Prox Cx lesion, 100 %stenosed.  Prox RCA to Mid RCA lesion, 95 %stenosed.  There is moderate left ventricular systolic dysfunction.  LV end diastolic pressure is mildly elevated.  The left ventricular ejection fraction is 35-45% by visual estimate.  There is mild (2+)  mitral regurgitation. 1. Severe 3 vessel obstructive CAD 2. Moderate LV dysfunction 3. Mildly elevated LV EDP ROS: All systems negative except as listed in HPI, PMH and Problem List.  SH:  Social History   Socioeconomic History  . Marital status: Married    Spouse name: Not on file  . Number of children: Not on file  . Years of education: Not on file  . Highest education level: Not on file  Occupational History  . Not on file  Social Needs  . Financial resource strain: Not on file  . Food insecurity:    Worry: Not on file    Inability: Not on file  . Transportation needs:    Medical: Not on file    Non-medical: Not on file  Tobacco Use  . Smoking status: Never Smoker  . Smokeless tobacco: Never Used  Substance and Sexual Activity  . Alcohol use: No  . Drug use: No  . Sexual activity: Not on file  Lifestyle  . Physical activity:    Days per week: Not on file    Minutes per session: Not on file  . Stress: Not on file  Relationships  . Social connections:    Talks on phone: Not on file    Gets together: Not on file    Attends religious service: Not on file    Active member of club or organization: Not on file    Attends meetings of clubs or organizations: Not on file    Relationship status: Not on file  . Intimate partner violence:  Fear of current or ex partner: Not on file    Emotionally abused: Not on file    Physically abused: Not on file    Forced sexual activity: Not on file  Other Topics Concern  . Not on file  Social History Narrative  . Not on file    FH:  Family History  Problem Relation Age of Onset  . Heart attack Father 64    Past Medical History:  Diagnosis Date  . Arrhythmia    Hx PAF, PVC's  . CHF (congestive heart failure) (HCC)   . Coronary artery disease    04/04/2017 STEMI  . Diabetes (HCC)   . Hyperlipidemia   . Hypertension   . New onset atrial fibrillation (HCC) 05/2017    Current Outpatient Medications  Medication Sig  Dispense Refill  . acetaminophen (TYLENOL) 325 MG tablet Take 2 tablets (650 mg total) by mouth every 6 (six) hours as needed for mild pain or headache.    Marland Kitchen aspirin EC 81 MG tablet Take 81 mg by mouth daily.    Marland Kitchen atorvastatin (LIPITOR) 20 MG tablet Take 1 tablet (20 mg total) by mouth daily at 6 PM. 30 tablet 6  . dapagliflozin propanediol (FARXIGA) 10 MG TABS tablet Take 10 mg by mouth daily.    . furosemide (LASIX) 40 MG tablet Take 2 tablets (80 mg total) by mouth 3 (three) times a week. On Mon, Wed and Fri 90 tablet 3  . glipiZIDE (GLUCOTROL XL) 10 MG 24 hr tablet Take 1 tablet (10 mg total) by mouth daily with breakfast.    . magnesium oxide (MAG-OX) 400 (241.3 Mg) MG tablet TAKE 1 TABLET BY MOUTH EVERY DAY 30 tablet 11  . metFORMIN (GLUCOPHAGE) 1000 MG tablet Take 1,000 mg by mouth 2 (two) times daily with a meal.    . metoprolol succinate (TOPROL XL) 25 MG 24 hr tablet Take 0.5 tablets (12.5 mg total) by mouth daily. 90 tablet 3  . sacubitril-valsartan (ENTRESTO) 24-26 MG Take 1 tablet by mouth 2 (two) times daily. 60 tablet 3  . saxagliptin HCl (ONGLYZA) 5 MG TABS tablet Take 5 mg by mouth daily.    Marland Kitchen spironolactone (ALDACTONE) 25 MG tablet Take 0.5 tablets (12.5 mg total) by mouth daily. 45 tablet 3  . warfarin (COUMADIN) 5 MG tablet Take 1/2 to 1 tablet by mouth daily as directed by the coumadin clinic 90 tablet 1   No current facility-administered medications for this encounter.     Vitals:   05/09/18 1508  BP: 108/70  Pulse: 90  SpO2: 99%  Weight: 187 lb 6 oz (85 kg)     Wt Readings from Last 3 Encounters:  05/09/18 187 lb 6 oz (85 kg)  02/06/18 178 lb 12.8 oz (81.1 kg)  01/01/18 180 lb 6.4 oz (81.8 kg)   PHYSICAL EXAM: General:  Well appearing. No resp difficulty HEENT: normal Neck: supple. JVP 8-9. Carotids 2+ bilat; no bruits. No lymphadenopathy or thryomegaly appreciated. Cor: PMI nondisplaced. Regular rate & rhythm. No rubs, gallops or murmurs. Lungs:  clear Abdomen: soft, nontender, nondistended. No hepatosplenomegaly. No bruits or masses. Good bowel sounds. Extremities: no cyanosis, clubbing, rash, edema Neuro: alert & orientedx3, cranial nerves grossly intact. moves all 4 extremities w/o difficulty. Affect pleasant  EKG: 97 bpm SR 1st degree AV block PR 304 ms   ASSESSMENT & PLAN:  1.Chronic Systolic Heart Failure: Ischemic cardiomyopathy, ECHO 05/24/2017 EF 35-40%.  NYHA II. Volume status mildly elevated. Continue lasix 80 mg  3 times a week.  - Continue Spiro 12.5 mg hs - Stop bb with low pr interval.    - Increase entresto to 49-51 mg twice a day.   2. CAD S/P CABG x4: -No s/s ischemia  - Continue statin and ASA.  3. Bioprosthetic MVR:  - Stable, continue warfarin.   4. PVCs:  - Amio stopped per PVT.  03/2018 Holter Monitor-4% PVC  Marked 1st degree AV block olter monitor 10/08/17 with 3 % PVC burden.    5. PAF EKG today. Maintaining NSR.  Continue warfarin.   Check BMET today and in 10 days. Follow up in 3 months.  EKG in 2 weeks. I reviewed EKG, holter monitor results, and medications with her today.   Amy Clegg NP-C  3:13 PM

## 2018-05-15 ENCOUNTER — Telehealth (HOSPITAL_COMMUNITY): Payer: Self-pay | Admitting: *Deleted

## 2018-05-15 NOTE — Telephone Encounter (Signed)
Pt called stating she could not use entresto copay card beginning of July since she just refilled the 24/26mg  dose. Her dose was increased to 49/51 on 05/09/18. Per Mardelle Matte she can double up on 24/26mg  (take 2 tablets twice daily) until she receives the 49/51mg  dose. Patient should keep her 10day lab/ekg visit. LEft detailed VM and asked patient to return my call to make sure she understood med change.

## 2018-05-20 ENCOUNTER — Other Ambulatory Visit (HOSPITAL_COMMUNITY): Payer: Self-pay | Admitting: Internal Medicine

## 2018-05-20 ENCOUNTER — Other Ambulatory Visit (HOSPITAL_COMMUNITY): Payer: 59

## 2018-05-23 ENCOUNTER — Ambulatory Visit (HOSPITAL_COMMUNITY)
Admission: RE | Admit: 2018-05-23 | Discharge: 2018-05-23 | Disposition: A | Discharge status: Home / Self Care | Payer: 59 | Source: Ambulatory Visit | Attending: Internal Medicine | Admitting: Internal Medicine

## 2018-05-23 ENCOUNTER — Encounter (HOSPITAL_COMMUNITY): Payer: Self-pay

## 2018-05-23 VITALS — BP 126/70 | HR 93 | Wt 185.2 lb

## 2018-05-23 DIAGNOSIS — R000 Tachycardia, unspecified: Secondary | ICD-10-CM | POA: Insufficient documentation

## 2018-05-23 DIAGNOSIS — I5043 Acute on chronic combined systolic (congestive) and diastolic (congestive) heart failure: Secondary | ICD-10-CM | POA: Diagnosis not present

## 2018-05-23 DIAGNOSIS — I4891 Unspecified atrial fibrillation: Secondary | ICD-10-CM

## 2018-05-23 DIAGNOSIS — R9431 Abnormal electrocardiogram [ECG] [EKG]: Secondary | ICD-10-CM | POA: Insufficient documentation

## 2018-05-23 DIAGNOSIS — I440 Atrioventricular block, first degree: Secondary | ICD-10-CM | POA: Insufficient documentation

## 2018-05-23 LAB — BASIC METABOLIC PANEL
Anion gap: 9 (ref 5–15)
BUN: 26 mg/dL — ABNORMAL HIGH (ref 6–20)
CO2: 26 mmol/L (ref 22–32)
Calcium: 9.6 mg/dL (ref 8.9–10.3)
Chloride: 104 mmol/L (ref 101–111)
Creatinine, Ser: 1.33 mg/dL — ABNORMAL HIGH (ref 0.44–1.00)
GFR calc Af Amer: 48 mL/min — ABNORMAL LOW (ref >60)
GFR calc non Af Amer: 42 mL/min — ABNORMAL LOW (ref >60)
Glucose, Bld: 94 mg/dL (ref 65–99)
Potassium: 4.8 mmol/L (ref 3.5–5.1)
Sodium: 139 mmol/L (ref 135–145)

## 2018-05-23 NOTE — Progress Notes (Signed)
Patient was seen today for repeat EKG. EKG NSR no changes at this time per [facility], NPc. Patient to keep august follow up appointment.

## 2018-05-27 ENCOUNTER — Other Ambulatory Visit (HOSPITAL_COMMUNITY): Payer: Self-pay | Admitting: Internal Medicine

## 2018-06-05 ENCOUNTER — Ambulatory Visit (INDEPENDENT_AMBULATORY_CARE_PROVIDER_SITE_OTHER): Payer: 59 | Admitting: Pharmacist

## 2018-06-05 DIAGNOSIS — Z951 Presence of aortocoronary bypass graft: Secondary | ICD-10-CM

## 2018-06-05 DIAGNOSIS — Z7901 Long term (current) use of anticoagulants: Secondary | ICD-10-CM | POA: Diagnosis not present

## 2018-06-05 DIAGNOSIS — I4891 Unspecified atrial fibrillation: Secondary | ICD-10-CM

## 2018-06-05 LAB — POCT INR: INR: 2 (ref 2.0–3.0)

## 2018-06-26 ENCOUNTER — Other Ambulatory Visit: Payer: Self-pay | Admitting: Internal Medicine

## 2018-07-08 ENCOUNTER — Other Ambulatory Visit: Payer: Self-pay | Admitting: Cardiovascular Disease

## 2018-07-13 ENCOUNTER — Other Ambulatory Visit (HOSPITAL_COMMUNITY): Payer: Self-pay | Admitting: Adult Health

## 2018-07-22 ENCOUNTER — Ambulatory Visit (INDEPENDENT_AMBULATORY_CARE_PROVIDER_SITE_OTHER): Payer: 59 | Admitting: Pharmacist Clinician (PhC)/ Clinical Pharmacy Specialist

## 2018-07-22 DIAGNOSIS — Z951 Presence of aortocoronary bypass graft: Secondary | ICD-10-CM

## 2018-07-22 DIAGNOSIS — I4891 Unspecified atrial fibrillation: Secondary | ICD-10-CM

## 2018-07-22 DIAGNOSIS — Z7901 Long term (current) use of anticoagulants: Secondary | ICD-10-CM

## 2018-07-22 LAB — POCT INR: INR: 2.3 (ref 2.0–3.0)

## 2018-07-30 ENCOUNTER — Telehealth (HOSPITAL_COMMUNITY): Payer: Self-pay | Admitting: Vascular Surgery

## 2018-07-30 NOTE — Telephone Encounter (Signed)
Left pt second message to resch 8/29 appt

## 2018-07-30 NOTE — Telephone Encounter (Signed)
Left pt message to reschedule appt due to Mardelle Matte not being in a meeting

## 2018-07-31 NOTE — Progress Notes (Signed)
Advanced Heart Failure Clinic Note    PCP: Dr Valarie Merino Primary Cardiologist: Dr Gala Romney   HPI: Rachael Davis is a 62 y.o. 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    Admitted 6/20 though 05/28/2017 with volume overload and pleural effusion. Diuresed with IV lasix and transitioned to lasix 40 mg twice a day. Also had lots of PVCs so started on amiodarone. Discharge weight was 177 pounds.   She presents today for regular follow up. Last visit coreg stopped with PR interval > 300 ms and Entresto increased. She is feeling great overall. Continues to work full time in [employer].  She has mild SOB with inclines. Denies PND/Orthopnea. Walks in the evenings, trying to remain active. Taking all medications as directed, but hasn't taken anything today. She has occasional chest heaviness, but this has been stable since after her surgery. She states it happens most often when she is sitting at the tv, but occasionally happens with walking. Prior to her CABG, her anginal equivalent was acute SOB, diaphoresis, and nausea. She has had none of these symptoms. She has not needed any extra lasix.   EKG: NSR 95 bpm with 1st degree AV block, PR interval 304 ms, personally reviewed.   03/2018 Holter Monitor-4% PVC  Marked 1st degree AV block  10/2017 Holter monitor- 3% PVC burden.    ECHO 05/11/2017 - Left ventricle: The cavity size was moderately dilated. Wall thickness was increased in a pattern of mild LVH. Systolic function was moderately reduced. The estimated ejection fraction was in the range of 35% to 40%. Diffuse hypokinesis. The study is not technically sufficient to allow evaluation of LV diastolic function. - Mitral valve: Normal appearing bioprosthetic MV no perivalvular regurgitation. - Left atrium: The atrium was mildly dilated. - Atrial septum: No defect or patent foramen ovale was identified. - Pulmonary  arteries: PA peak pressure: 35 mm Hg (S).  LHC 04/04/2017   Prox LAD lesion, 75 %stenosed.  Prox LAD to Mid LAD lesion, 65 %stenosed.  Mid LAD to Dist LAD lesion, 55 %stenosed.  Ramus lesion, 90 %stenosed.  Lat Ramus lesion, 50 %stenosed.  Prox Cx lesion, 100 %stenosed.  Prox RCA to Mid RCA lesion, 95 %stenosed.  There is moderate left ventricular systolic dysfunction.  LV end diastolic pressure is mildly elevated.  The left ventricular ejection fraction is 35-45% by visual estimate.  There is mild (2+) mitral regurgitation. 1. Severe 3 vessel obstructive CAD 2. Moderate LV dysfunction 3. Mildly elevated LV EDP  Review of systems complete and found to be negative unless listed in HPI.    SH:  Social History   Socioeconomic History  . Marital status: Married    Spouse name: Not on file  . Number of children: Not on file  . Years of education: Not on file  . Highest education level: Not on file  Occupational History  . Not on file  Social Needs  . Financial resource strain: Not on file  . Food insecurity:    Worry: Not on file    Inability: Not on file  . Transportation needs:    Medical: Not on file    Non-medical: Not on file  Tobacco Use  . Smoking status: Never Smoker  . Smokeless tobacco: Never Used  Substance and Sexual Activity  . Alcohol use: No  . Drug use: No  . Sexual activity: Not on file  Lifestyle  .  Physical activity:    Days per week: Not on file    Minutes per session: Not on file  . Stress: Not on file  Relationships  . Social connections:    Talks on phone: Not on file    Gets together: Not on file    Attends religious service: Not on file    Active member of club or organization: Not on file    Attends meetings of clubs or organizations: Not on file    Relationship status: Not on file  . Intimate partner violence:    Fear of current or ex partner: Not on file    Emotionally abused: Not on file    Physically abused: Not on file      Forced sexual activity: Not on file  Other Topics Concern  . Not on file  Social History Narrative  . Not on file    FH:  Family History  Problem Relation Age of Onset  . Heart attack Father 76    Past Medical History:  Diagnosis Date  . Arrhythmia    Hx PAF, PVC's  . CHF (congestive heart failure) (HCC)   . Coronary artery disease    04/04/2017 STEMI  . Diabetes (HCC)   . Hyperlipidemia   . Hypertension   . New onset atrial fibrillation (HCC) 05/2017    Current Outpatient Medications  Medication Sig Dispense Refill  . acetaminophen (TYLENOL) 325 MG tablet Take 2 tablets (650 mg total) by mouth every 6 (six) hours as needed for mild pain or headache.    Marland Kitchen aspirin EC 81 MG tablet Take 81 mg by mouth daily.    Marland Kitchen atorvastatin (LIPITOR) 20 MG tablet TAKE 1 TABLET BY MOUTH DAILY AT 6 PM. 30 tablet 6  . dapagliflozin propanediol (FARXIGA) 10 MG TABS tablet Take 10 mg by mouth daily.    Marland Kitchen ENTRESTO 49-51 MG TAKE 1 TABLET BY MOUTH TWICE A DAY 180 tablet 2  . furosemide (LASIX) 40 MG tablet Take 2 tablets (80 mg total) by mouth 3 (three) times a week. On Mon, Wed and Fri 90 tablet 3  . glipiZIDE (GLUCOTROL XL) 10 MG 24 hr tablet Take 1 tablet (10 mg total) by mouth daily with breakfast.    . magnesium oxide (MAG-OX) 400 (241.3 Mg) MG tablet TAKE 1 TABLET BY MOUTH EVERY DAY 30 tablet 11  . metFORMIN (GLUCOPHAGE) 1000 MG tablet Take 1,000 mg by mouth 2 (two) times daily with a meal.    . saxagliptin HCl (ONGLYZA) 5 MG TABS tablet Take 5 mg by mouth daily.    Marland Kitchen spironolactone (ALDACTONE) 25 MG tablet Take 0.5 tablets (12.5 mg total) by mouth daily. 45 tablet 3  . warfarin (COUMADIN) 5 MG tablet TAKE 1 TO 1 AND 1/2 TABLETS BY MOUTH DAILY AS DIRECTED BY THE COUMADIN CLIN 135 tablet 1   No current facility-administered medications for this encounter.     Vitals:   08/01/18 0832  BP: 130/82  Pulse: 95  SpO2: 99%  Weight: 84.8 kg (187 lb)     Wt Readings from Last 3 Encounters:   08/01/18 84.8 kg (187 lb)  05/23/18 84 kg (185 lb 4 oz)  05/09/18 85 kg (187 lb 6 oz)   PHYSICAL EXAM: General: Well appearing. No resp difficulty. HEENT: Normal Neck: Supple. JVP 7-8. Carotids 2+ bilat; no bruits. No thyromegaly or nodule noted. Cor: PMI nondisplaced. RRR, No M/G/R noted Lungs: CTAB, normal effort. Abdomen: Soft, non-tender, non-distended, no HSM. No bruits or  masses. +BS  Extremities: No cyanosis, clubbing, or rash. R and LLE no edema.  Neuro: Alert & orientedx3, cranial nerves grossly intact. moves all 4 extremities w/o difficulty. Affect pleasant   EKG: NSR 95 bpm with 1st degree AV block, PR interval 304 ms, personally reviewed.   ASSESSMENT & PLAN:  1.Chronic Systolic Heart Failure: Ischemic cardiomyopathy - Echo 02/06/2018 LVEF 35-40% - NYHA II symptoms - Volume status stable on exam.  - Continue lasix 80 mg 3 times a week. Reviewed sliding scale diuretics.  - Continue Spiro 12.5 mg hs. No room to increase with K 4.9 07/26/18. - Off bb with long pr interval.    - Continue entresto 49-51 mg twice a day.  2. CAD S/P CABG x4: - No s/s of ischemia.    - Stable, non-exertional chest heaviness. Reviewed alarm symptoms and instructed to call with any change in her current symptoms. Reviewed case with Dr. Gala Romney.  - Continue statin and ASA. 3. Bioprosthetic MVR:  - Stable.  - Continue warfarin. No bleeding 4. PVCs:  - Amio stopped per PVT. - 03/2018 Holter Monitor-4% PVC  Marked 1st degree AV block olter monitor 10/08/17 with 3 % PVC burden.  - Stable.  5. PAF - Regular on exam. NSR by EKG.  - Continue warfarin.  6. DM2 - Hgb A1C 6.2 07/26/18  Recent labs at at PCP, 07/26/18 Cr 1.2 and K 4.9. Hgb stable.    Graciella Freer, PA-C  8:39 AM   Greater than 50% of the 25 minute visit was spent in counseling/coordination of care regarding disease state education, salt/fluid restriction, sliding scale diuretics, and medication compliance.

## 2018-08-01 ENCOUNTER — Encounter (HOSPITAL_COMMUNITY): Payer: Self-pay

## 2018-08-01 ENCOUNTER — Ambulatory Visit (HOSPITAL_COMMUNITY)
Admission: RE | Admit: 2018-08-01 | Discharge: 2018-08-01 | Disposition: A | Discharge status: Home / Self Care | Payer: 59 | Source: Ambulatory Visit | Attending: Internal Medicine | Admitting: Internal Medicine

## 2018-08-01 VITALS — BP 130/82 | HR 95 | Wt 187.0 lb

## 2018-08-01 DIAGNOSIS — I255 Ischemic cardiomyopathy: Secondary | ICD-10-CM | POA: Diagnosis not present

## 2018-08-01 DIAGNOSIS — Z7901 Long term (current) use of anticoagulants: Secondary | ICD-10-CM | POA: Diagnosis not present

## 2018-08-01 DIAGNOSIS — Z951 Presence of aortocoronary bypass graft: Secondary | ICD-10-CM | POA: Insufficient documentation

## 2018-08-01 DIAGNOSIS — I493 Ventricular premature depolarization: Secondary | ICD-10-CM | POA: Diagnosis not present

## 2018-08-01 DIAGNOSIS — I440 Atrioventricular block, first degree: Secondary | ICD-10-CM | POA: Diagnosis not present

## 2018-08-01 DIAGNOSIS — I5022 Chronic systolic (congestive) heart failure: Secondary | ICD-10-CM

## 2018-08-01 DIAGNOSIS — E119 Type 2 diabetes mellitus without complications: Secondary | ICD-10-CM | POA: Diagnosis not present

## 2018-08-01 DIAGNOSIS — E118 Type 2 diabetes mellitus with unspecified complications: Secondary | ICD-10-CM

## 2018-08-01 DIAGNOSIS — Z79899 Other long term (current) drug therapy: Secondary | ICD-10-CM | POA: Diagnosis not present

## 2018-08-01 DIAGNOSIS — I252 Old myocardial infarction: Secondary | ICD-10-CM | POA: Diagnosis not present

## 2018-08-01 DIAGNOSIS — Z9889 Other specified postprocedural states: Secondary | ICD-10-CM | POA: Diagnosis not present

## 2018-08-01 DIAGNOSIS — I2510 Atherosclerotic heart disease of native coronary artery without angina pectoris: Secondary | ICD-10-CM | POA: Insufficient documentation

## 2018-08-01 DIAGNOSIS — E785 Hyperlipidemia, unspecified: Secondary | ICD-10-CM | POA: Insufficient documentation

## 2018-08-01 DIAGNOSIS — Z7984 Long term (current) use of oral hypoglycemic drugs: Secondary | ICD-10-CM | POA: Insufficient documentation

## 2018-08-01 DIAGNOSIS — Z8249 Family history of ischemic heart disease and other diseases of the circulatory system: Secondary | ICD-10-CM | POA: Insufficient documentation

## 2018-08-01 DIAGNOSIS — I480 Paroxysmal atrial fibrillation: Secondary | ICD-10-CM | POA: Diagnosis not present

## 2018-08-01 DIAGNOSIS — Z953 Presence of xenogenic heart valve: Secondary | ICD-10-CM | POA: Diagnosis not present

## 2018-08-01 DIAGNOSIS — I110 Hypertensive heart disease with heart failure: Secondary | ICD-10-CM | POA: Insufficient documentation

## 2018-08-01 DIAGNOSIS — Z7982 Long term (current) use of aspirin: Secondary | ICD-10-CM | POA: Insufficient documentation

## 2018-08-01 NOTE — Patient Instructions (Signed)
No changes to medication at this time.  Follow up 4 months with Dr. Gala Romney.  ___________________________________________________________________ [place] Code: 1900  Take all medication as prescribed the day of your appointment. Bring all medications with you to your appointment.  Do the following things EVERYDAY: 1) Weigh yourself in the morning before breakfast. Write it down and keep it in a log. 2) Take your medicines as prescribed 3) Eat low salt foods-Limit salt (sodium) to 2000 mg per day.  4) Stay as active as you can everyday 5) Limit all fluids for the day to less than 2 liters

## 2018-08-13 ENCOUNTER — Other Ambulatory Visit: Payer: Self-pay | Admitting: Cardiovascular Disease

## 2018-08-24 ENCOUNTER — Other Ambulatory Visit (HOSPITAL_COMMUNITY): Payer: Self-pay | Admitting: Internal Medicine

## 2018-08-24 IMAGING — CR DG CHEST 2V
2 series · 2 of 2 positions shown · non-contrast
Comparison: Chest radiograph from one day prior.

CLINICAL DATA: Dyspnea

EXAM:
CHEST  2 VIEW

[chest pa]
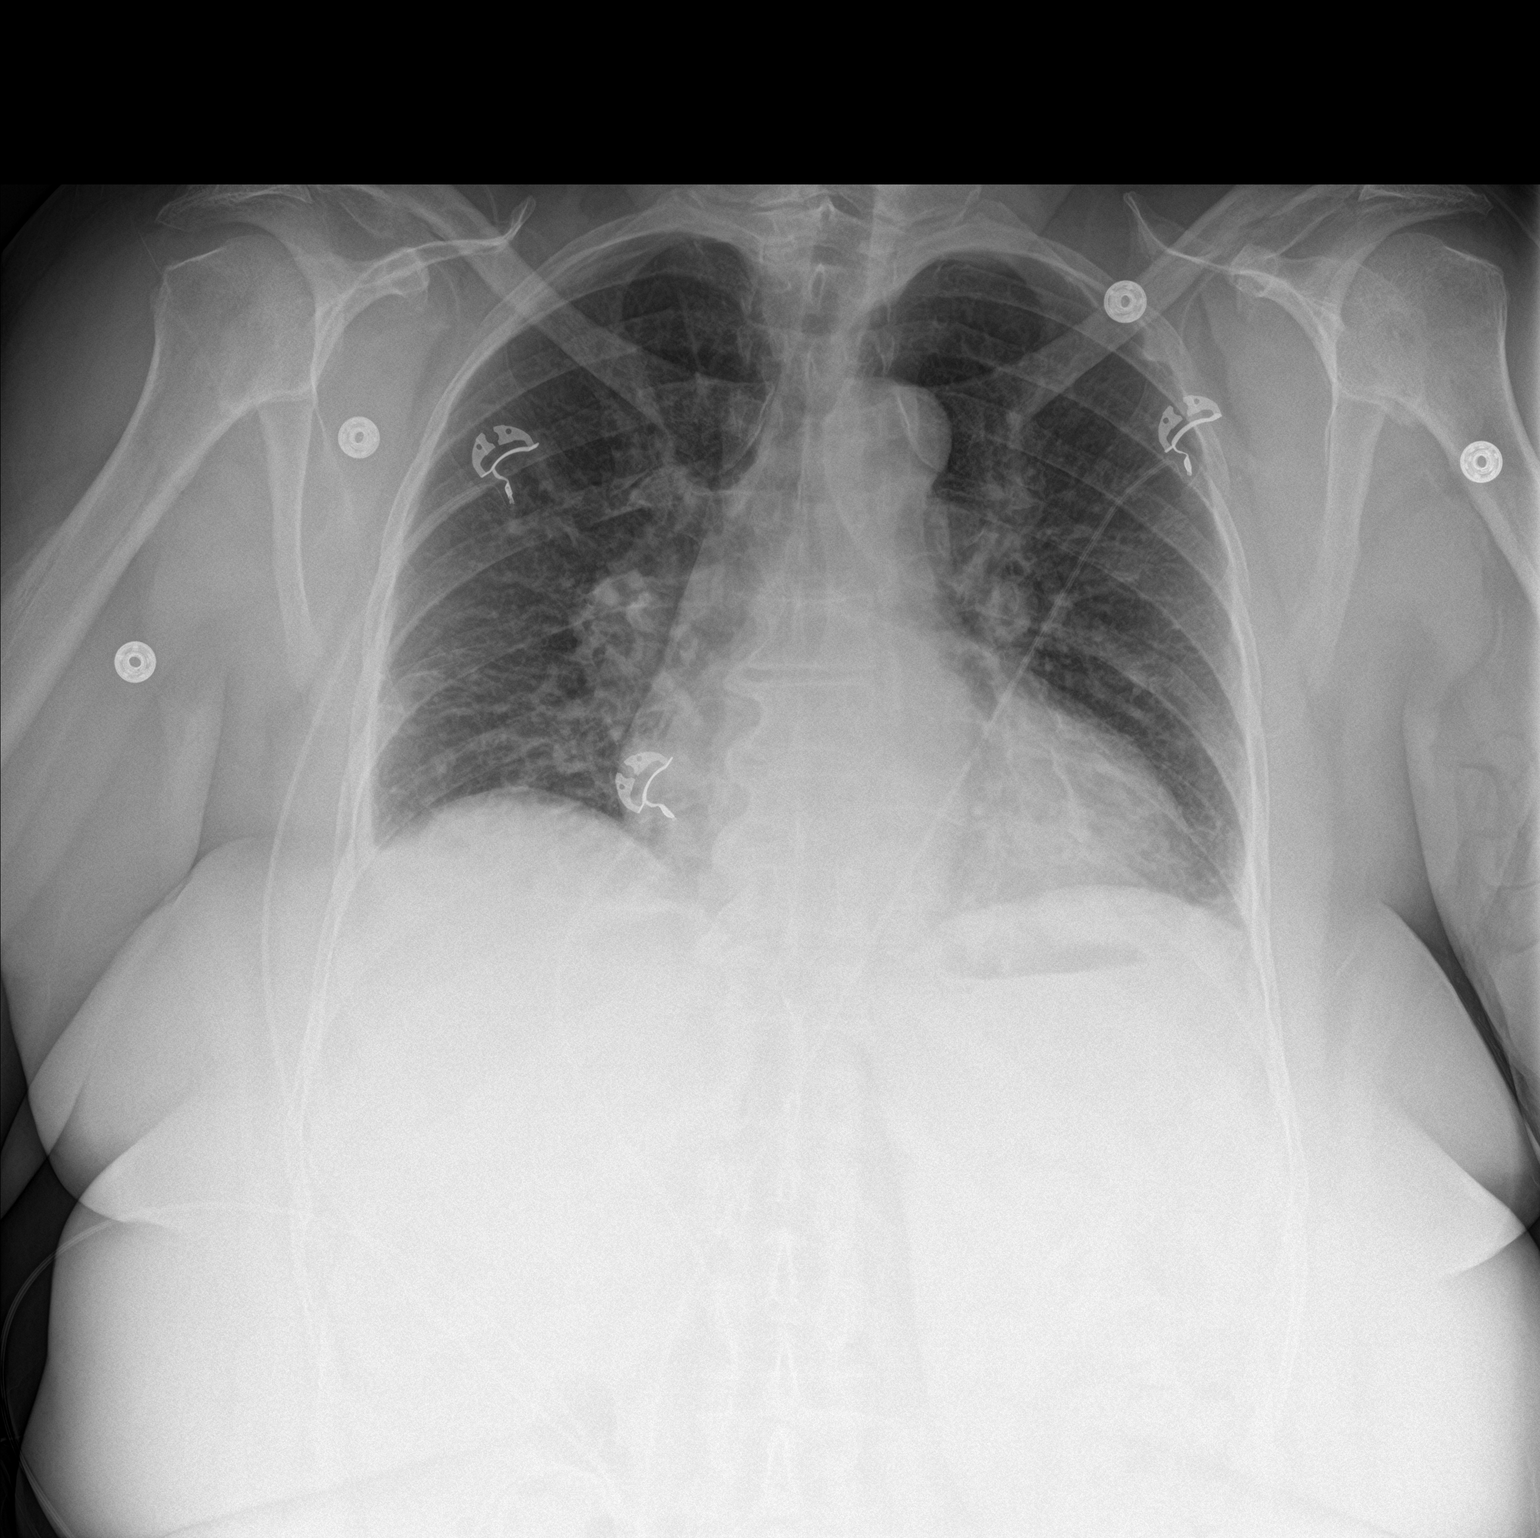

[chest lat]
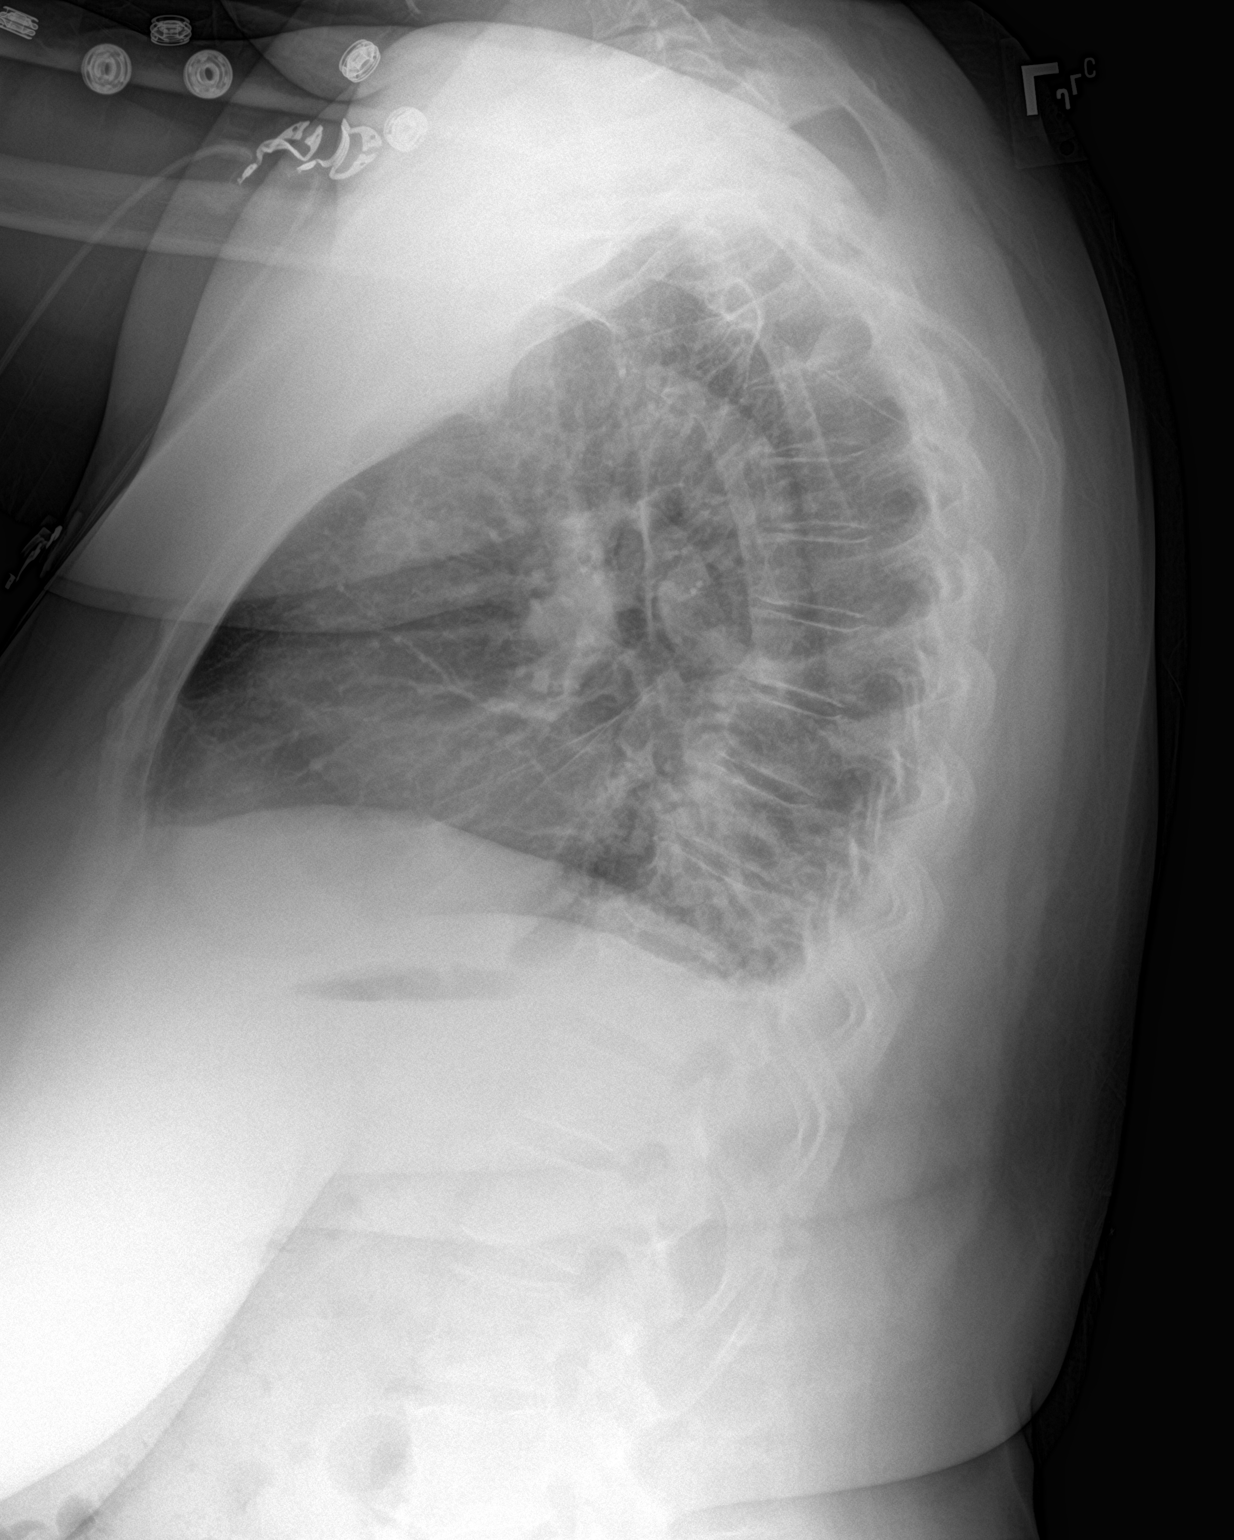

[2 of 2 positions shown; findings below may reference images not displayed]

FINDINGS: Stable cardiomediastinal silhouette with top-normal heart size. No
pneumothorax. No pleural effusion. Patchy bilateral lower lobe
opacities, not appreciably changed.
IMPRESSION: Patchy bilateral lower lobe lung opacities, not appreciably changed,
which may represent pneumonia, aspiration and/ or atelectasis.
Recommend follow-up PA and lateral post treatment chest radiographs
in 4-6 weeks.

## 2018-09-09 ENCOUNTER — Ambulatory Visit (INDEPENDENT_AMBULATORY_CARE_PROVIDER_SITE_OTHER): Payer: 59 | Admitting: Pharmacist

## 2018-09-09 DIAGNOSIS — I4891 Unspecified atrial fibrillation: Secondary | ICD-10-CM

## 2018-09-09 DIAGNOSIS — Z951 Presence of aortocoronary bypass graft: Secondary | ICD-10-CM | POA: Diagnosis not present

## 2018-09-09 DIAGNOSIS — Z7901 Long term (current) use of anticoagulants: Secondary | ICD-10-CM | POA: Diagnosis not present

## 2018-09-09 LAB — POCT INR: INR: 3.1 — AB (ref 2.0–3.0)

## 2018-09-10 ENCOUNTER — Other Ambulatory Visit: Payer: Self-pay | Admitting: Family Medicine

## 2018-09-10 DIAGNOSIS — Z1231 Encounter for screening mammogram for malignant neoplasm of breast: Secondary | ICD-10-CM

## 2018-10-10 IMAGING — DX DG CHEST 2V
2 series · 2 of 2 positions shown · non-contrast
Comparison: 05/23/2017 and prior exams

CLINICAL DATA: Shortness of breath and new onset atrial
fibrillation.

EXAM:
CHEST  2 VIEW

[chest pa]
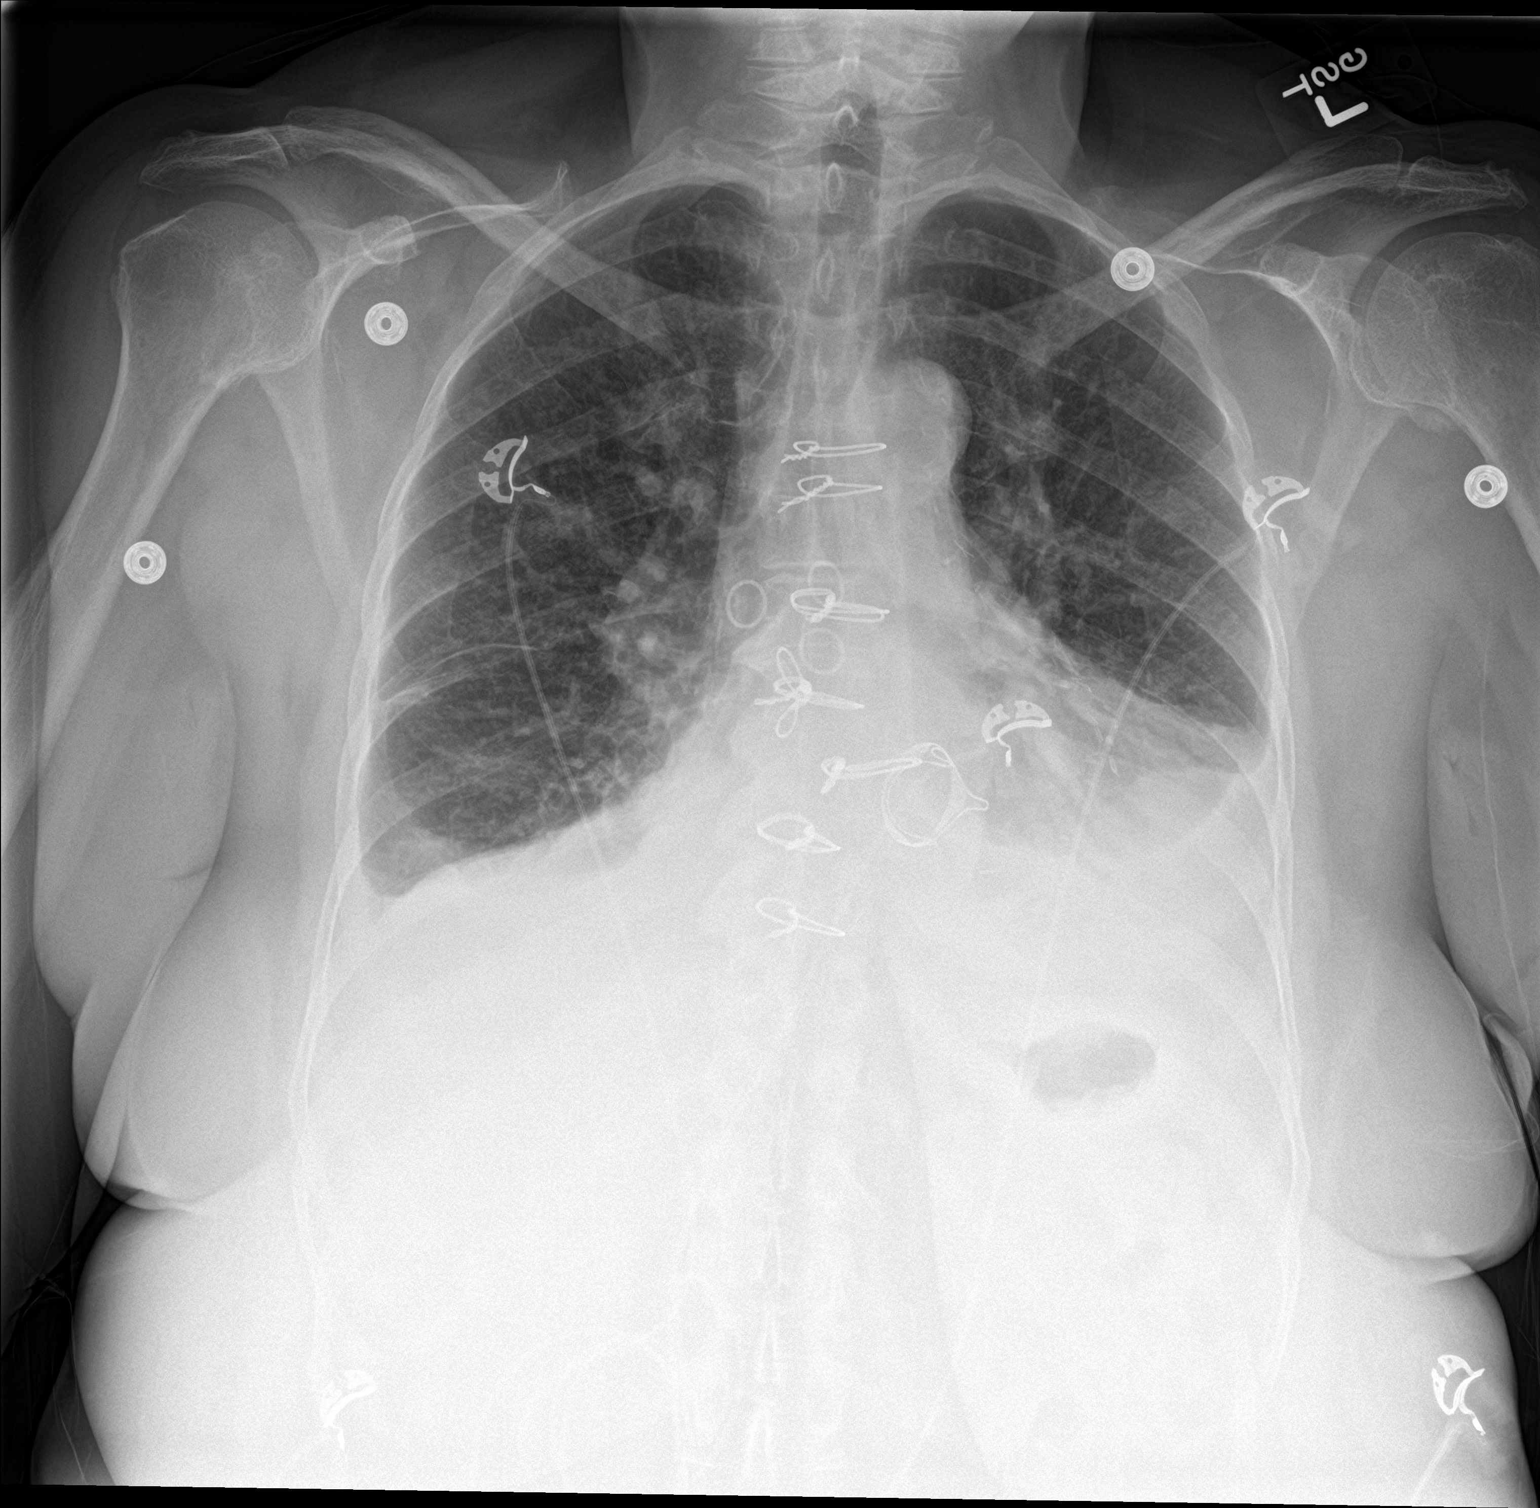

[chest lat]
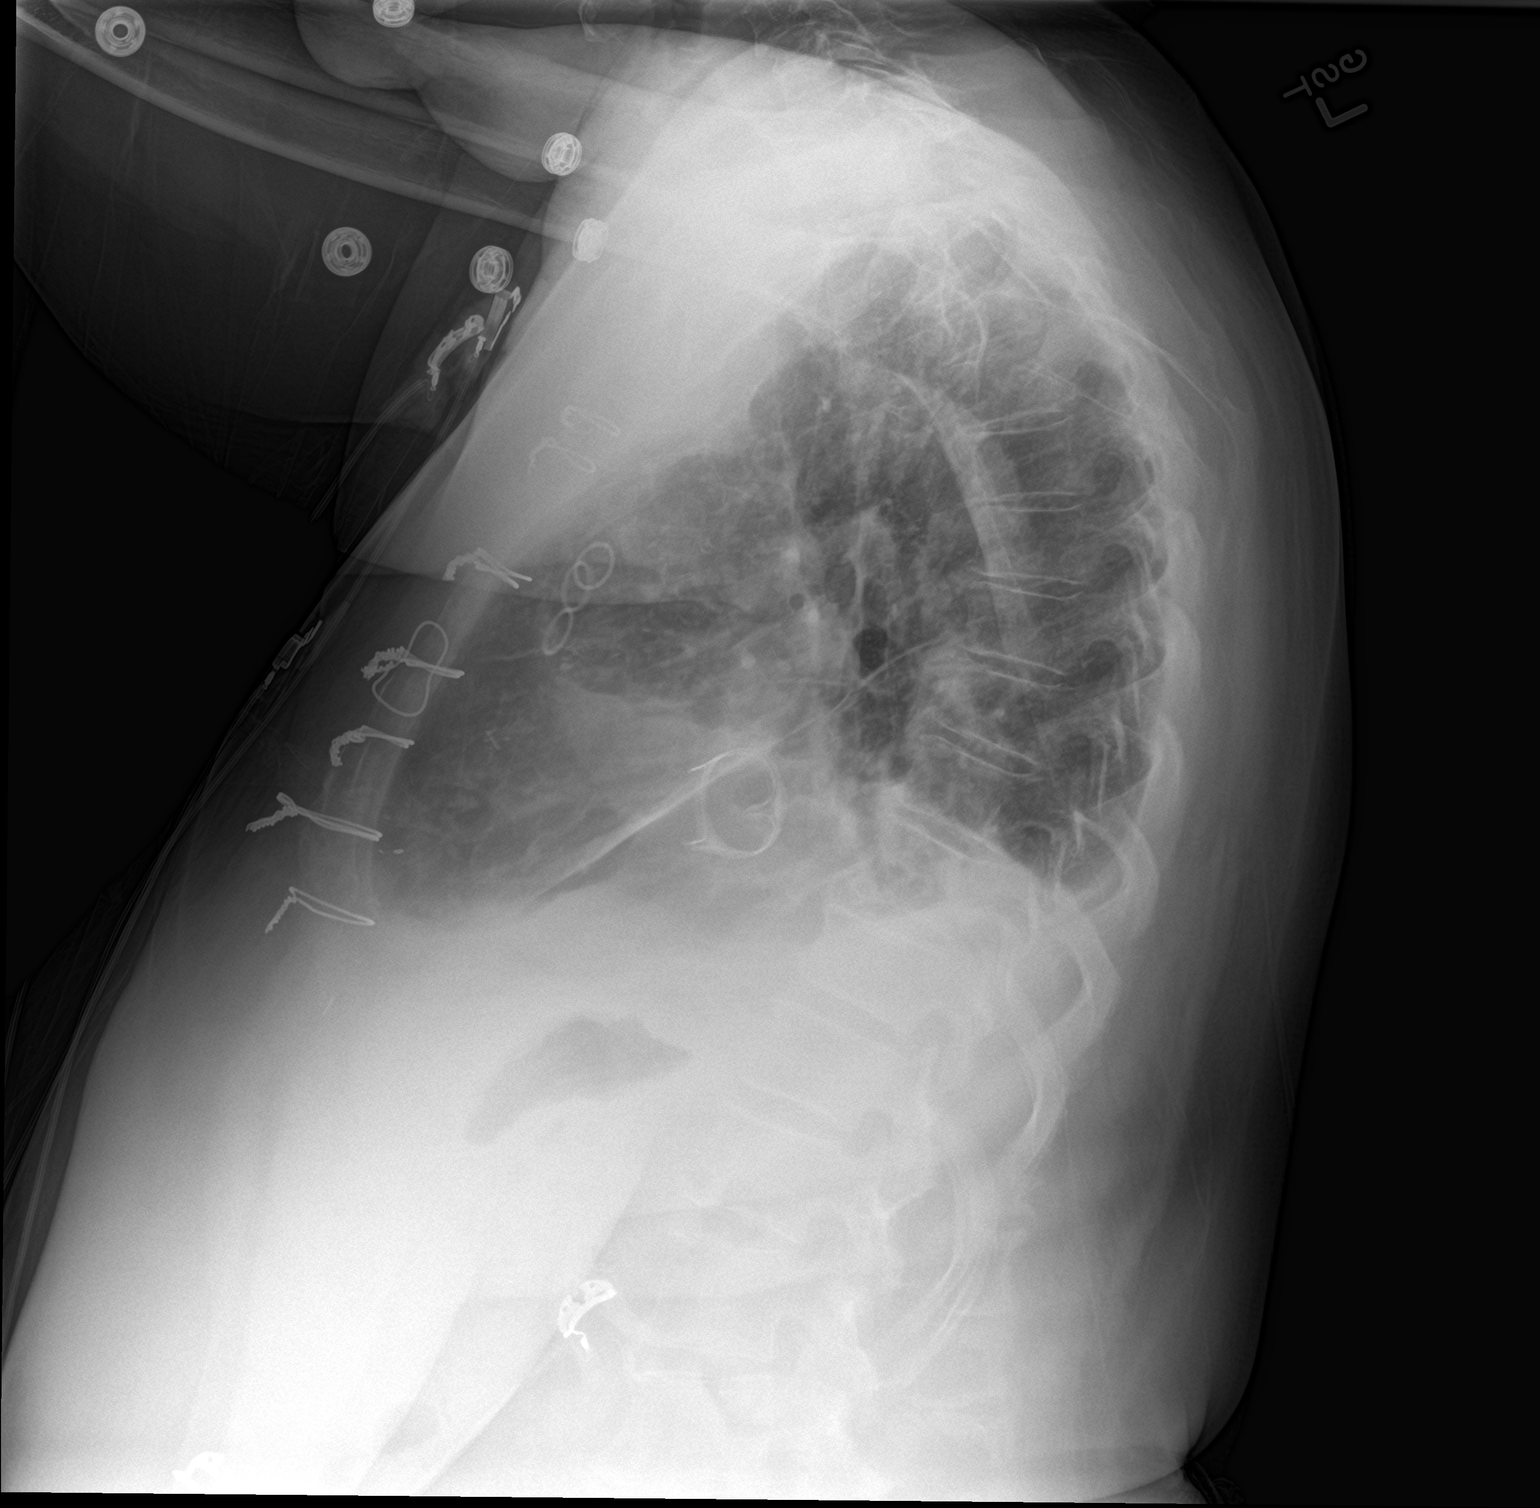

[2 of 2 positions shown; findings below may reference images not displayed]

FINDINGS: Cardiomegaly, CABG changes and mitral valve replacement again noted.

Bilateral pleural effusions and bibasilar atelectasis/consolidation
again noted.

There is no evidence of pneumothorax.

There has been little interval change since the prior study.
IMPRESSION: Unchanged appearance of the chest with bilateral pleural effusions
and bibasilar atelectasis/consolidation.

## 2018-10-11 IMAGING — DX DG CHEST 2V
2 series · 2 of 2 positions shown · non-contrast
Comparison: 05/24/2017

CLINICAL DATA: 61-year-old female status post thoracentesis

EXAM:
CHEST  2 VIEW

[chest pa]
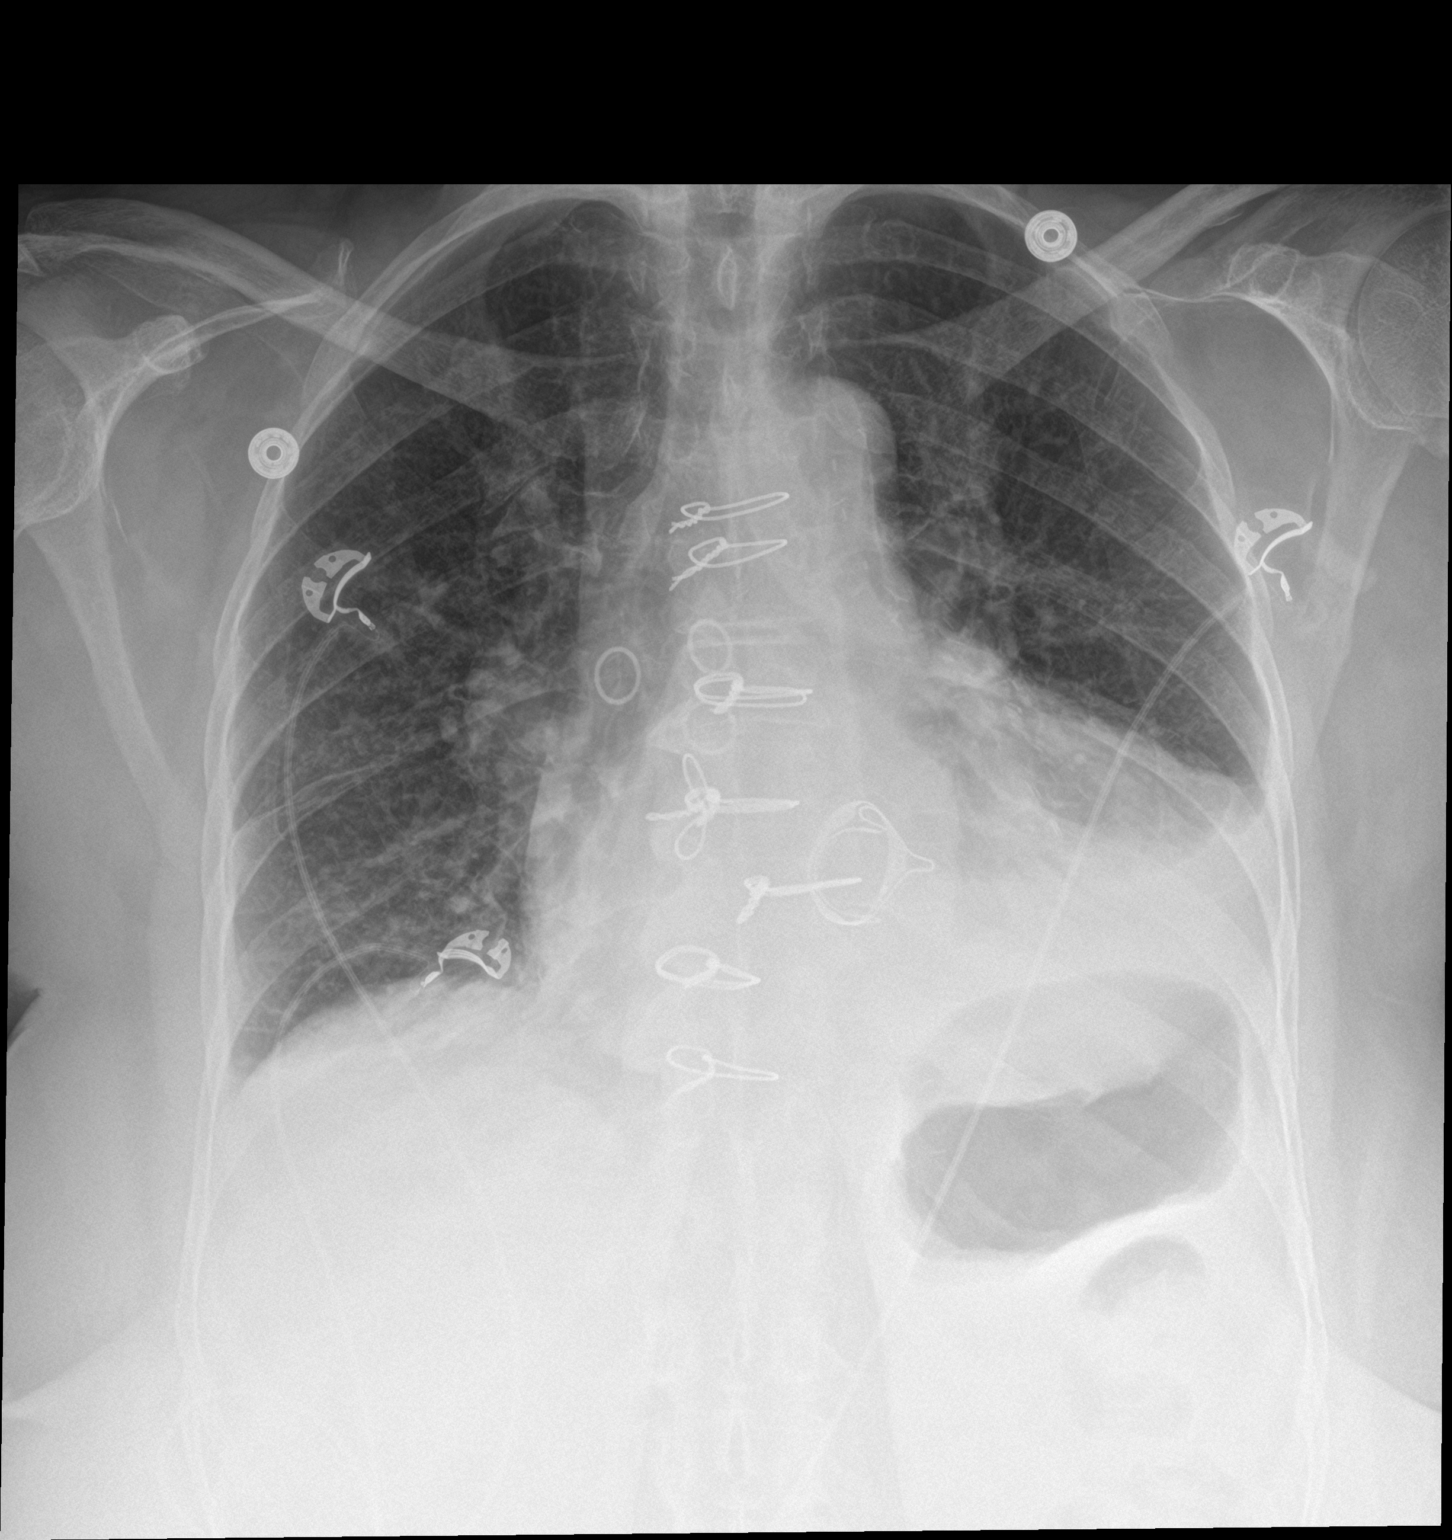

[chest lat]
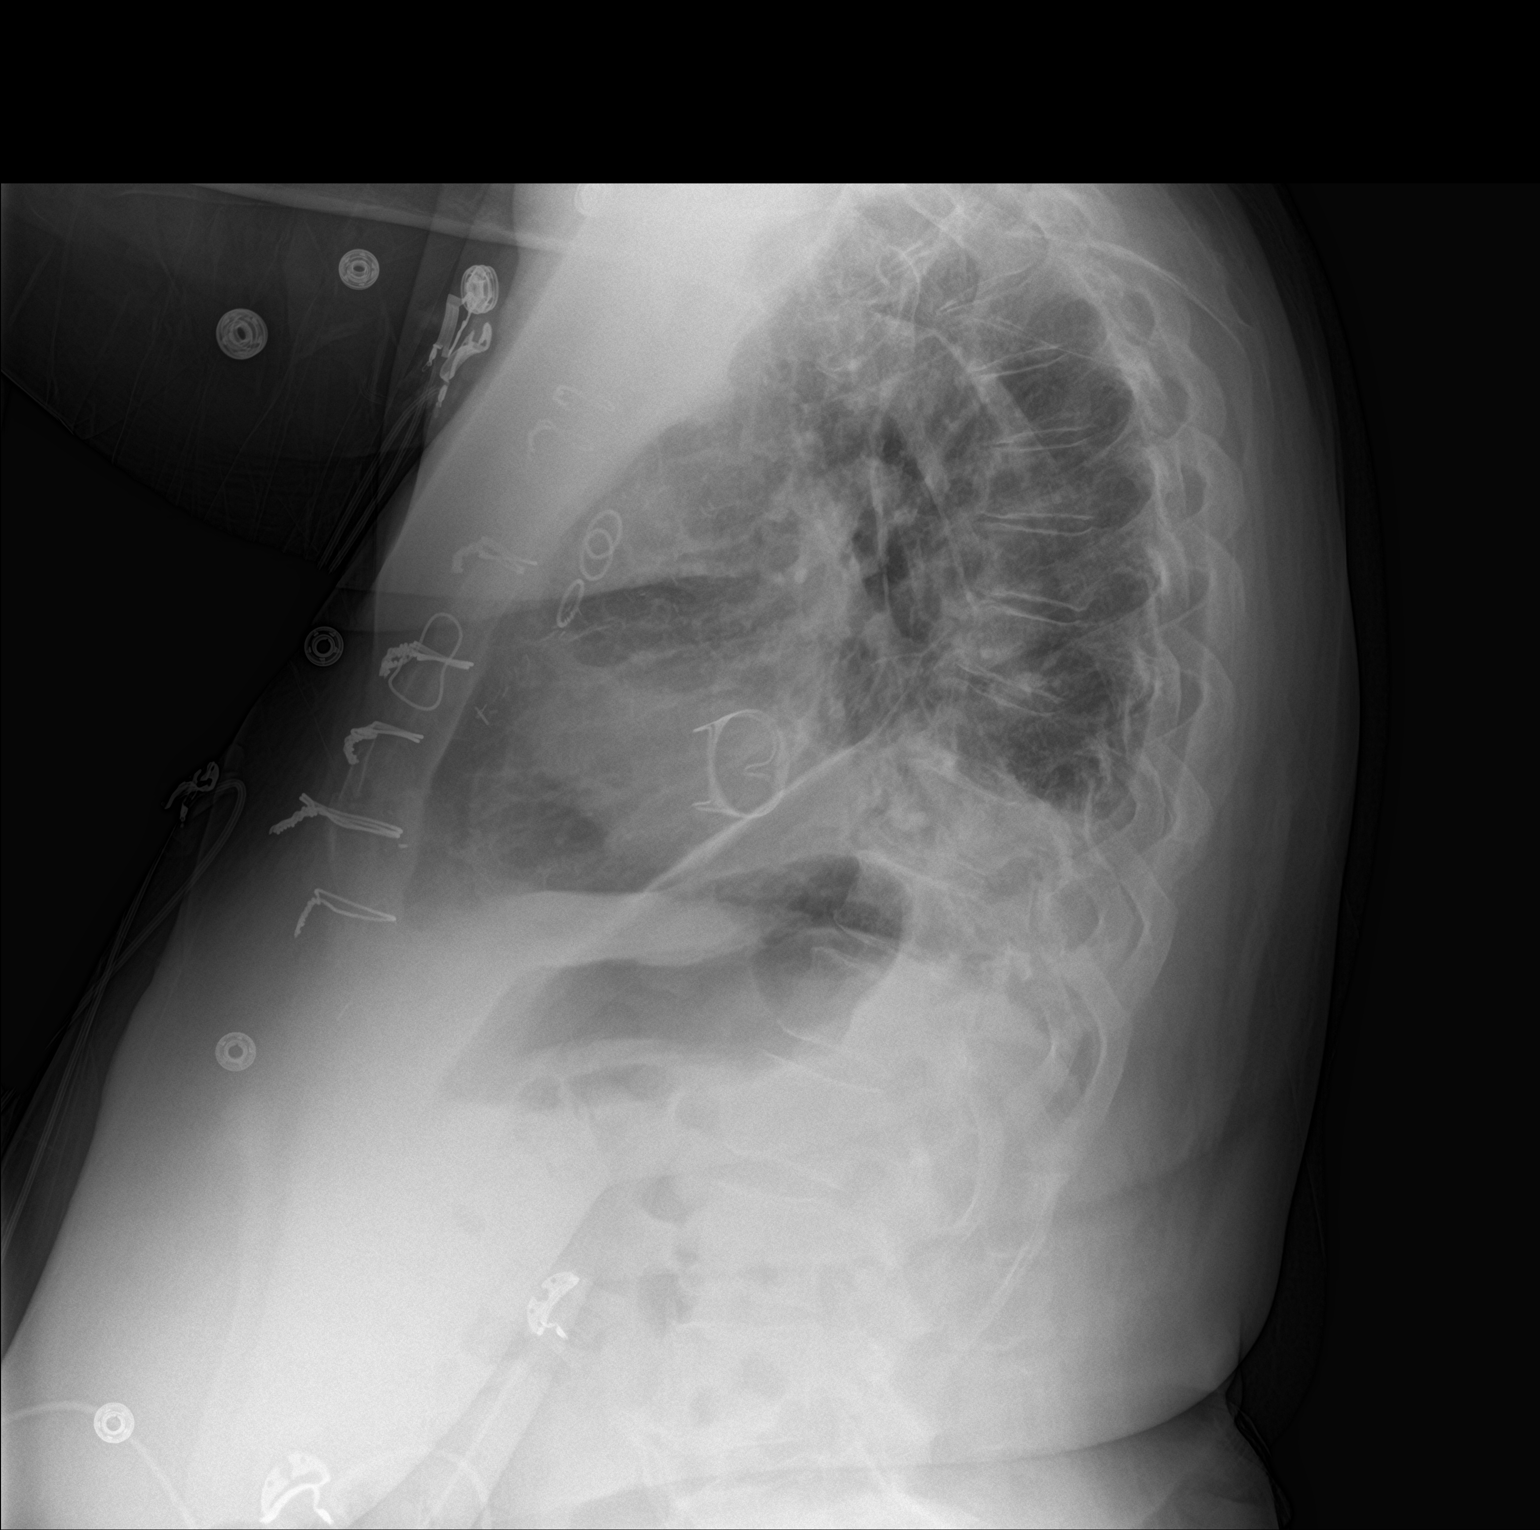

[2 of 2 positions shown; findings below may reference images not displayed]

FINDINGS: Cardiomediastinal silhouette unchanged in size and contour.

Surgical changes of prior median sternotomy, CABG, mitral valve
annuloplasty.

Compare to the prior, decreased opacity at the left base, though
persisting basilar opacity with blunting of left costophrenic angle
and the costophrenic sulcus on the lateral view. Thickening of the
fissure.

No pneumothorax status post thoracentesis.

Right lung relatively well aerated, similar to prior.
IMPRESSION: Improved left basilar opacity status post thoracentesis with no
evidence of pneumothorax.

Surgical changes of prior median sternotomy, CABG, mitral valve
annuloplasty.

## 2018-10-17 ENCOUNTER — Ambulatory Visit (INDEPENDENT_AMBULATORY_CARE_PROVIDER_SITE_OTHER): Payer: 59 | Admitting: Pharmacist

## 2018-10-17 DIAGNOSIS — Z7901 Long term (current) use of anticoagulants: Secondary | ICD-10-CM

## 2018-10-17 DIAGNOSIS — Z951 Presence of aortocoronary bypass graft: Secondary | ICD-10-CM

## 2018-10-17 DIAGNOSIS — I4891 Unspecified atrial fibrillation: Secondary | ICD-10-CM

## 2018-10-17 LAB — POCT INR: INR: 1.8 — AB (ref 2.0–3.0)

## 2018-11-13 ENCOUNTER — Ambulatory Visit (HOSPITAL_COMMUNITY)
Admission: RE | Admit: 2018-11-13 | Discharge: 2018-11-13 | Disposition: A | Discharge status: Home / Self Care | Payer: 59 | Source: Ambulatory Visit | Attending: Internal Medicine | Admitting: Internal Medicine

## 2018-11-13 ENCOUNTER — Encounter (HOSPITAL_COMMUNITY): Payer: Self-pay | Admitting: Internal Medicine

## 2018-11-13 VITALS — BP 130/92 | HR 68 | Wt 194.0 lb

## 2018-11-13 DIAGNOSIS — I255 Ischemic cardiomyopathy: Secondary | ICD-10-CM | POA: Diagnosis not present

## 2018-11-13 DIAGNOSIS — Z7984 Long term (current) use of oral hypoglycemic drugs: Secondary | ICD-10-CM | POA: Insufficient documentation

## 2018-11-13 DIAGNOSIS — I2510 Atherosclerotic heart disease of native coronary artery without angina pectoris: Secondary | ICD-10-CM | POA: Diagnosis not present

## 2018-11-13 DIAGNOSIS — Z7901 Long term (current) use of anticoagulants: Secondary | ICD-10-CM | POA: Diagnosis not present

## 2018-11-13 DIAGNOSIS — Z953 Presence of xenogenic heart valve: Secondary | ICD-10-CM | POA: Diagnosis not present

## 2018-11-13 DIAGNOSIS — I480 Paroxysmal atrial fibrillation: Secondary | ICD-10-CM | POA: Diagnosis not present

## 2018-11-13 DIAGNOSIS — I493 Ventricular premature depolarization: Secondary | ICD-10-CM | POA: Diagnosis not present

## 2018-11-13 DIAGNOSIS — R000 Tachycardia, unspecified: Secondary | ICD-10-CM | POA: Insufficient documentation

## 2018-11-13 DIAGNOSIS — E119 Type 2 diabetes mellitus without complications: Secondary | ICD-10-CM | POA: Diagnosis not present

## 2018-11-13 DIAGNOSIS — I5043 Acute on chronic combined systolic (congestive) and diastolic (congestive) heart failure: Secondary | ICD-10-CM | POA: Diagnosis not present

## 2018-11-13 DIAGNOSIS — Z951 Presence of aortocoronary bypass graft: Secondary | ICD-10-CM | POA: Diagnosis not present

## 2018-11-13 DIAGNOSIS — Z8249 Family history of ischemic heart disease and other diseases of the circulatory system: Secondary | ICD-10-CM | POA: Insufficient documentation

## 2018-11-13 DIAGNOSIS — I252 Old myocardial infarction: Secondary | ICD-10-CM | POA: Insufficient documentation

## 2018-11-13 DIAGNOSIS — I5022 Chronic systolic (congestive) heart failure: Secondary | ICD-10-CM | POA: Diagnosis present

## 2018-11-13 DIAGNOSIS — E785 Hyperlipidemia, unspecified: Secondary | ICD-10-CM | POA: Diagnosis not present

## 2018-11-13 DIAGNOSIS — I110 Hypertensive heart disease with heart failure: Secondary | ICD-10-CM | POA: Diagnosis not present

## 2018-11-13 DIAGNOSIS — I440 Atrioventricular block, first degree: Secondary | ICD-10-CM | POA: Insufficient documentation

## 2018-11-13 DIAGNOSIS — Z7982 Long term (current) use of aspirin: Secondary | ICD-10-CM | POA: Diagnosis not present

## 2018-11-13 DIAGNOSIS — Z79899 Other long term (current) drug therapy: Secondary | ICD-10-CM | POA: Insufficient documentation

## 2018-11-13 LAB — BASIC METABOLIC PANEL
Anion gap: 10 (ref 5–15)
BUN: 18 mg/dL (ref 8–23)
CO2: 23 mmol/L (ref 22–32)
Calcium: 9 mg/dL (ref 8.9–10.3)
Chloride: 107 mmol/L (ref 98–111)
Creatinine, Ser: 1.15 mg/dL — ABNORMAL HIGH (ref 0.44–1.00)
GFR calc Af Amer: 59 mL/min — ABNORMAL LOW (ref >60)
GFR calc non Af Amer: 51 mL/min — ABNORMAL LOW (ref >60)
Glucose, Bld: 87 mg/dL (ref 70–99)
Potassium: 4.3 mmol/L (ref 3.5–5.1)
Sodium: 140 mmol/L (ref 135–145)

## 2018-11-13 MED ORDER — SACUBITRIL-VALSARTAN 97-103 MG PO TABS: 1.0000 | ORAL_TABLET | Freq: Two times a day (BID) | ORAL | 6 refills | Status: DC

## 2018-11-13 NOTE — Progress Notes (Signed)
Advanced Heart Failure Clinic Note    PCP: Dr Valarie Merino Primary Cardiologist: Dr Gala Romney   HPI: Rachael Davis is a 62 y.o. 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    Admitted 6/20 though 05/28/2017 with volume overload and pleural effusion. Diuresed with IV lasix and transitioned to lasix 40 mg twice a day. Also had lots of PVCs so started on amiodarone. Discharge weight was 177 pounds.   She returns today for HF follow up. Overall doing okay.  She has had chest numbness every since CABG. No change in symptoms. She has had a cold for a month now with runny nose, congestion, ear pain, and sore throat. Went to Urgent Care for cough and was prescribed antibiotics. Cough has improved, has occasional white mucus. No fever or chills. Denies edema, orthopnea, or PND. Takes lasix 3x/week. Does not take extra. She is having flutterings in her chest at work and when she lies down for bed. Usually resolves after 1 minute with deep breathing. Limits salt intake. Avoids canned foods. Snacking on salt free potato chips and sweets. Drinks less than 2 L fluid/day. Weight has been slowly trending up, up 7 lbs on our scale. She is exercising less, mostly just walking around at work.   03/2018 Holter Monitor-4% PVC  Marked 1st degree AV block  10/2017 Holter monitor- 3% PVC burden.    ECHO 05/11/2017 - Left ventricle: The cavity size was moderately dilated. Wall thickness was increased in a pattern of mild LVH. Systolic function was moderately reduced. The estimated ejection fraction was in the range of 35% to 40%. Diffuse hypokinesis. The study is not technically sufficient to allow evaluation of LV diastolic function. - Mitral valve: Normal appearing bioprosthetic MV no perivalvular regurgitation. - Left atrium: The atrium was mildly dilated. - Atrial septum: No defect or patent foramen ovale was identified. - Pulmonary  arteries: PA peak pressure: 35 mm Hg (S).  LHC 04/04/2017   Prox LAD lesion, 75 %stenosed.  Prox LAD to Mid LAD lesion, 65 %stenosed.  Mid LAD to Dist LAD lesion, 55 %stenosed.  Ramus lesion, 90 %stenosed.  Lat Ramus lesion, 50 %stenosed.  Prox Cx lesion, 100 %stenosed.  Prox RCA to Mid RCA lesion, 95 %stenosed.  There is moderate left ventricular systolic dysfunction.  LV end diastolic pressure is mildly elevated.  The left ventricular ejection fraction is 35-45% by visual estimate.  There is mild (2+) mitral regurgitation. 1. Severe 3 vessel obstructive CAD 2. Moderate LV dysfunction 3. Mildly elevated LV EDP  Review of systems complete and found to be negative unless listed in HPI.   SH:  Social History   Socioeconomic History  . Marital status: Married    Spouse name: Not on file  . Number of children: Not on file  . Years of education: Not on file  . Highest education level: Not on file  Occupational History  . Not on file  Social Needs  . Financial resource strain: Not on file  . Food insecurity:    Worry: Not on file    Inability: Not on file  . Transportation needs:    Medical: Not on file    Non-medical: Not on file  Tobacco Use  . Smoking status: Never Smoker  . Smokeless tobacco: Never Used  Substance and Sexual Activity  . Alcohol use: No  . Drug use: No  . Sexual activity: Not on file  Lifestyle  . Physical activity:    Days per week: Not on file    Minutes per session: Not on file  . Stress: Not on file  Relationships  . Social connections:    Talks on phone: Not on file    Gets together: Not on file    Attends religious service: Not on file    Active member of club or organization: Not on file    Attends meetings of clubs or organizations: Not on file    Relationship status: Not on file  . Intimate partner violence:    Fear of current or ex partner: Not on file    Emotionally abused: Not on file    Physically abused: Not on file      Forced sexual activity: Not on file  Other Topics Concern  . Not on file  Social History Narrative  . Not on file    FH:  Family History  Problem Relation Age of Onset  . Heart attack Father 74    Past Medical History:  Diagnosis Date  . Arrhythmia    Hx PAF, PVC's  . CHF (congestive heart failure) (HCC)   . Coronary artery disease    04/04/2017 STEMI  . Diabetes (HCC)   . Hyperlipidemia   . Hypertension   . New onset atrial fibrillation (HCC) 05/2017    Current Outpatient Medications  Medication Sig Dispense Refill  . acetaminophen (TYLENOL) 325 MG tablet Take 2 tablets (650 mg total) by mouth every 6 (six) hours as needed for mild pain or headache.    Marland Kitchen aspirin EC 81 MG tablet Take 81 mg by mouth daily.    Marland Kitchen atorvastatin (LIPITOR) 20 MG tablet TAKE 1 TABLET BY MOUTH DAILY AT 6 PM. 30 tablet 6  . dapagliflozin propanediol (FARXIGA) 10 MG TABS tablet Take 10 mg by mouth daily.    Marland Kitchen doxycycline (VIBRAMYCIN) 100 MG capsule TAKE 1 CAPSULE BY MOUTH TWICE A DAY FOR 10 DAYS  0  . ENTRESTO 49-51 MG TAKE 1 TABLET BY MOUTH TWICE A DAY 180 tablet 2  . furosemide (LASIX) 40 MG tablet Take 2 tablets (80 mg total) by mouth 3 (three) times a week. On Mon, Wed and Fri 90 tablet 3  . glipiZIDE (GLUCOTROL XL) 10 MG 24 hr tablet Take 1 tablet (10 mg total) by mouth daily with breakfast.    . magnesium oxide (MAG-OX) 400 MG tablet TAKE 1 TABLET BY MOUTH EVERY DAY 30 tablet 3  . metFORMIN (GLUCOPHAGE) 1000 MG tablet Take 1,000 mg by mouth 2 (two) times daily with a meal.    . predniSONE (STERAPRED UNI-PAK 21 TAB) 10 MG (21) TBPK tablet Take 10 mg by mouth daily.    . saxagliptin HCl (ONGLYZA) 5 MG TABS tablet Take 5 mg by mouth daily.    Marland Kitchen spironolactone (ALDACTONE) 25 MG tablet TAKE ONE HALF TABLET (12.5 MG TOTAL) BY MOUTH DAILY. 45 tablet 3  . warfarin (COUMADIN) 5 MG tablet TAKE 1 TO 1 AND 1/2 TABLETS BY MOUTH DAILY AS DIRECTED BY THE COUMADIN CLIN 135 tablet 1   No current  facility-administered medications for this encounter.     Vitals:   11/13/18 1511  BP: (!) 130/92  Pulse: 68  SpO2: 96%  Weight: 88 kg (194 lb)     Wt Readings from Last 3 Encounters:  11/13/18 88 kg (194 lb)  08/01/18 84.8 kg (187 lb)  05/23/18 84 kg (185 lb 4 oz)   PHYSICAL EXAM: General:  Well appearing. No resp difficulty HEENT: normal Neck: supple. no JVD. Carotids 2+ bilat; no bruits. No lymphadenopathy or thryomegaly appreciated. Cor: PMI nondisplaced. Regular rate & rhythm. No rubs, gallops or murmurs. Lungs: clear Abdomen: soft, nontender, nondistended. No hepatosplenomegaly. No bruits or masses. Good bowel sounds. Extremities: no cyanosis, clubbing, rash, trace edema Neuro: alert & orientedx3, cranial nerves grossly intact. moves all 4 extremities w/o difficulty. Affect pleasant    EKG: NSR 86 bpm with 1st degree AV block (PR 284 ms). Looks unchanged from previous. Personally reviewed    ASSESSMENT & PLAN:  1.Chronic Systolic Heart Failure: Ischemic cardiomyopathy - Echo 02/06/2018 LVEF 35-40% - NYHA II symptoms - Volume status stable on exam.  - Continue lasix 80 mg 3 times a week. Reviewed sliding scale diuretics.  - Continue Spiro 12.5 mg hs.  - Off bb with long pr interval.    - Increase entresto 97/103 mg twice a day. BMET today. 2. CAD S/P CABG x4: - No s/s ischemia. - Stable, non-exertional chest "numbness". Pain has been constant since CABG. Likely neuropathic pain - Continue statin and ASA. 3. Bioprosthetic MVR:  - Stable.  - Continue warfarin. Denies bleeding. 4. PVCs:  - Amio stopped per PVT. - 03/2018 Holter Monitor-4% PVC  Marked 1st degree AV block olter monitor 10/08/17 with 3 % PVC burden.  - Stable. 5. PAF - Regular on exam - Continue warfarin. Denies bleeding 6. DM2 - Hgb A1C 6.2 07/26/18. Continue farxiga  Repeat echo in 6 months at follow up.  Alford Highland, NP  3:28 PM   Patient seen and examined with the above-signed Advanced  Practice Provider and/or Housestaff. I personally reviewed laboratory data, imaging studies and relevant notes. I independently examined the patient and formulated the important aspects of the plan. I have edited the note to reflect any of my changes or salient points. I have personally discussed the plan with the patient and/or family.  Doing quite well. NYHA II. Volume status stable. ECG stable with NSR and marked 1AVB. Will increase Entresto. Return in 6 months with echo.   Arvilla Meres, MD  4:07 PM

## 2018-11-13 NOTE — Patient Instructions (Signed)
Labs done today  INCREASE Entresto 97/103 twice daily  Your physician has requested that you have an echocardiogram. Echocardiography is a painless test that uses sound waves to create images of your heart. It provides your doctor with information about the size and shape of your heart and how well your heart's chambers and valves are working. This procedure takes approximately one hour. There are no restrictions for this procedure.  Follow up with Dr. Gala Romney in 6 months.

## 2018-11-14 ENCOUNTER — Ambulatory Visit
Admission: RE | Admit: 2018-11-14 | Discharge: 2018-11-14 | Disposition: A | Discharge status: Home / Self Care | Payer: 59 | Source: Ambulatory Visit | Attending: Family Medicine | Admitting: Family Medicine

## 2018-11-14 ENCOUNTER — Ambulatory Visit (INDEPENDENT_AMBULATORY_CARE_PROVIDER_SITE_OTHER): Payer: 59 | Admitting: Pharmacist Clinician (PhC)/ Clinical Pharmacy Specialist

## 2018-11-14 DIAGNOSIS — I4891 Unspecified atrial fibrillation: Secondary | ICD-10-CM

## 2018-11-14 DIAGNOSIS — Z951 Presence of aortocoronary bypass graft: Secondary | ICD-10-CM | POA: Diagnosis not present

## 2018-11-14 DIAGNOSIS — Z1231 Encounter for screening mammogram for malignant neoplasm of breast: Secondary | ICD-10-CM

## 2018-11-14 DIAGNOSIS — Z7901 Long term (current) use of anticoagulants: Secondary | ICD-10-CM

## 2018-11-14 LAB — POCT INR: INR: 2.9 (ref 2.0–3.0)

## 2018-12-11 ENCOUNTER — Ambulatory Visit (INDEPENDENT_AMBULATORY_CARE_PROVIDER_SITE_OTHER): Payer: 59 | Admitting: Pharmacist

## 2018-12-11 DIAGNOSIS — Z951 Presence of aortocoronary bypass graft: Secondary | ICD-10-CM

## 2018-12-11 DIAGNOSIS — Z7901 Long term (current) use of anticoagulants: Secondary | ICD-10-CM | POA: Diagnosis not present

## 2018-12-11 DIAGNOSIS — I4891 Unspecified atrial fibrillation: Secondary | ICD-10-CM

## 2018-12-11 LAB — POCT INR: INR: 2.8 (ref 2.0–3.0)

## 2018-12-13 ENCOUNTER — Other Ambulatory Visit: Payer: Self-pay | Admitting: Cardiovascular Disease

## 2018-12-13 MED ORDER — ATORVASTATIN CALCIUM 20 MG PO TABS: 20.0000 mg | ORAL_TABLET | Freq: Every day | ORAL | 6 refills | Status: DC

## 2018-12-13 MED ORDER — ATORVASTATIN CALCIUM 20 MG PO TABS: 20.0000 mg | ORAL_TABLET | Freq: Every day | ORAL | 1 refills | Status: DC

## 2018-12-13 NOTE — Telephone Encounter (Signed)
 *  STAT* If patient is at the pharmacy, call can be transferred to refill team.   1. Which medications need to be refilled? (please list name of each medication and dose if known) atorvastatin (LIPITOR) 20 MG tablet  2. Which pharmacy/location (including street and city if local pharmacy) is medication to be sent to? [facility]  3. Do they need a 30 day or 90 day supply? 90  Patient is out of medication

## 2018-12-13 NOTE — Telephone Encounter (Signed)
Rx request sent to pharmacy.  

## 2018-12-20 ENCOUNTER — Other Ambulatory Visit: Payer: Self-pay | Admitting: Cardiovascular Disease

## 2019-01-23 ENCOUNTER — Telehealth: Payer: Self-pay

## 2019-01-23 NOTE — Telephone Encounter (Signed)
Called and left the pt a msg to call back to reschedule

## 2019-02-06 ENCOUNTER — Ambulatory Visit: Payer: 59 | Admitting: Cardiovascular Disease

## 2019-02-12 ENCOUNTER — Other Ambulatory Visit: Payer: Self-pay

## 2019-02-12 ENCOUNTER — Ambulatory Visit (INDEPENDENT_AMBULATORY_CARE_PROVIDER_SITE_OTHER): Payer: 59 | Admitting: Pharmacist

## 2019-02-12 DIAGNOSIS — Z7901 Long term (current) use of anticoagulants: Secondary | ICD-10-CM

## 2019-02-12 DIAGNOSIS — Z951 Presence of aortocoronary bypass graft: Secondary | ICD-10-CM | POA: Diagnosis not present

## 2019-02-12 DIAGNOSIS — I4891 Unspecified atrial fibrillation: Secondary | ICD-10-CM

## 2019-02-12 LAB — POCT INR: INR: 4.6 — AB (ref 2.0–3.0)

## 2019-03-02 ENCOUNTER — Telehealth: Payer: Self-pay | Admitting: *Deleted

## 2019-03-02 NOTE — Telephone Encounter (Signed)
Spoke with patient regarding appointment 4/1. Patient stated was doing well and would prefer to just postpone her follow up visit. She is being seen in heart failure clinic in June and would like to follow up 2-3 months after if that visit ok. Will forward to Dr Duke Salvia for review

## 2019-03-03 NOTE — Telephone Encounter (Signed)
That is fine 

## 2019-03-04 ENCOUNTER — Telehealth: Payer: Self-pay | Admitting: Pharmacist Clinician (PhC)/ Clinical Pharmacy Specialist

## 2019-03-04 NOTE — Telephone Encounter (Signed)

## 2019-03-05 ENCOUNTER — Other Ambulatory Visit: Payer: Self-pay

## 2019-03-05 ENCOUNTER — Ambulatory Visit: Payer: 59 | Admitting: Cardiovascular Disease

## 2019-03-05 ENCOUNTER — Ambulatory Visit (INDEPENDENT_AMBULATORY_CARE_PROVIDER_SITE_OTHER): Payer: 59 | Admitting: *Deleted

## 2019-03-05 DIAGNOSIS — Z7901 Long term (current) use of anticoagulants: Secondary | ICD-10-CM | POA: Diagnosis not present

## 2019-03-05 DIAGNOSIS — I4891 Unspecified atrial fibrillation: Secondary | ICD-10-CM

## 2019-03-05 DIAGNOSIS — Z951 Presence of aortocoronary bypass graft: Secondary | ICD-10-CM | POA: Diagnosis not present

## 2019-03-05 LAB — POCT INR: INR: 2.8 (ref 2.0–3.0)

## 2019-03-24 ENCOUNTER — Other Ambulatory Visit: Payer: Self-pay | Admitting: Cardiovascular Disease

## 2019-03-24 NOTE — Telephone Encounter (Signed)
Mag-ox 400 mg refilled. 

## 2019-04-04 ENCOUNTER — Telehealth: Payer: Self-pay

## 2019-04-04 NOTE — Telephone Encounter (Signed)

## 2019-04-07 ENCOUNTER — Ambulatory Visit (INDEPENDENT_AMBULATORY_CARE_PROVIDER_SITE_OTHER): Payer: 59 | Admitting: *Deleted

## 2019-04-07 ENCOUNTER — Other Ambulatory Visit: Payer: Self-pay

## 2019-04-07 DIAGNOSIS — Z7901 Long term (current) use of anticoagulants: Secondary | ICD-10-CM

## 2019-04-07 DIAGNOSIS — Z951 Presence of aortocoronary bypass graft: Secondary | ICD-10-CM

## 2019-04-07 DIAGNOSIS — I4891 Unspecified atrial fibrillation: Secondary | ICD-10-CM

## 2019-04-07 LAB — POCT INR: INR: 2.8 (ref 2.0–3.0)

## 2019-04-07 NOTE — Patient Instructions (Signed)
Description   Spoke with pt and instructed to conttinue with 1 tablet daily except 1.5 tablets each Friday.  Repeat INR in 4 weeks.

## 2019-05-05 ENCOUNTER — Telehealth: Payer: Self-pay

## 2019-05-05 NOTE — Telephone Encounter (Signed)
lmom to move appt back to the [facility] location and get prescreened

## 2019-05-06 ENCOUNTER — Telehealth: Payer: Self-pay

## 2019-05-06 NOTE — Telephone Encounter (Signed)
lmom for prescreen  

## 2019-05-06 NOTE — Telephone Encounter (Signed)

## 2019-05-08 ENCOUNTER — Ambulatory Visit (INDEPENDENT_AMBULATORY_CARE_PROVIDER_SITE_OTHER): Payer: 59 | Admitting: Pharmacist Clinician (PhC)/ Clinical Pharmacy Specialist

## 2019-05-08 ENCOUNTER — Other Ambulatory Visit: Payer: Self-pay

## 2019-05-08 DIAGNOSIS — Z951 Presence of aortocoronary bypass graft: Secondary | ICD-10-CM

## 2019-05-08 DIAGNOSIS — I4891 Unspecified atrial fibrillation: Secondary | ICD-10-CM

## 2019-05-08 DIAGNOSIS — Z7901 Long term (current) use of anticoagulants: Secondary | ICD-10-CM

## 2019-05-08 LAB — POCT INR: INR: 3.8 — AB (ref 2.0–3.0)

## 2019-05-21 ENCOUNTER — Telehealth: Payer: Self-pay

## 2019-05-21 NOTE — Telephone Encounter (Signed)

## 2019-05-28 ENCOUNTER — Ambulatory Visit (HOSPITAL_COMMUNITY)
Admission: RE | Admit: 2019-05-28 | Discharge: 2019-05-28 | Disposition: A | Discharge status: Home / Self Care | Payer: 59 | Source: Ambulatory Visit | Attending: Internal Medicine | Admitting: Internal Medicine

## 2019-05-28 ENCOUNTER — Ambulatory Visit (INDEPENDENT_AMBULATORY_CARE_PROVIDER_SITE_OTHER): Payer: 59 | Admitting: Pharmacist Clinician (PhC)/ Clinical Pharmacy Specialist

## 2019-05-28 ENCOUNTER — Encounter (HOSPITAL_COMMUNITY): Payer: Self-pay | Admitting: Internal Medicine

## 2019-05-28 ENCOUNTER — Other Ambulatory Visit: Payer: Self-pay

## 2019-05-28 ENCOUNTER — Ambulatory Visit (HOSPITAL_BASED_OUTPATIENT_CLINIC_OR_DEPARTMENT_OTHER)
Admission: RE | Admit: 2019-05-28 | Discharge: 2019-05-28 | Disposition: A | Discharge status: Home / Self Care | Payer: 59 | Source: Ambulatory Visit | Attending: Internal Medicine | Admitting: Internal Medicine

## 2019-05-28 VITALS — BP 122/86 | HR 97 | Wt 195.2 lb

## 2019-05-28 DIAGNOSIS — I5043 Acute on chronic combined systolic (congestive) and diastolic (congestive) heart failure: Secondary | ICD-10-CM | POA: Diagnosis not present

## 2019-05-28 DIAGNOSIS — I5022 Chronic systolic (congestive) heart failure: Secondary | ICD-10-CM | POA: Diagnosis not present

## 2019-05-28 DIAGNOSIS — I110 Hypertensive heart disease with heart failure: Secondary | ICD-10-CM | POA: Insufficient documentation

## 2019-05-28 DIAGNOSIS — I340 Nonrheumatic mitral (valve) insufficiency: Secondary | ICD-10-CM | POA: Insufficient documentation

## 2019-05-28 DIAGNOSIS — E049 Nontoxic goiter, unspecified: Secondary | ICD-10-CM | POA: Insufficient documentation

## 2019-05-28 DIAGNOSIS — R9431 Abnormal electrocardiogram [ECG] [EKG]: Secondary | ICD-10-CM | POA: Diagnosis not present

## 2019-05-28 DIAGNOSIS — Z7984 Long term (current) use of oral hypoglycemic drugs: Secondary | ICD-10-CM | POA: Insufficient documentation

## 2019-05-28 DIAGNOSIS — I25810 Atherosclerosis of coronary artery bypass graft(s) without angina pectoris: Secondary | ICD-10-CM | POA: Diagnosis not present

## 2019-05-28 DIAGNOSIS — I252 Old myocardial infarction: Secondary | ICD-10-CM | POA: Insufficient documentation

## 2019-05-28 DIAGNOSIS — Z79899 Other long term (current) drug therapy: Secondary | ICD-10-CM | POA: Diagnosis not present

## 2019-05-28 DIAGNOSIS — E119 Type 2 diabetes mellitus without complications: Secondary | ICD-10-CM | POA: Insufficient documentation

## 2019-05-28 DIAGNOSIS — Z8249 Family history of ischemic heart disease and other diseases of the circulatory system: Secondary | ICD-10-CM | POA: Diagnosis not present

## 2019-05-28 DIAGNOSIS — E785 Hyperlipidemia, unspecified: Secondary | ICD-10-CM | POA: Diagnosis not present

## 2019-05-28 DIAGNOSIS — Z953 Presence of xenogenic heart valve: Secondary | ICD-10-CM | POA: Insufficient documentation

## 2019-05-28 DIAGNOSIS — Z951 Presence of aortocoronary bypass graft: Secondary | ICD-10-CM | POA: Diagnosis not present

## 2019-05-28 DIAGNOSIS — I255 Ischemic cardiomyopathy: Secondary | ICD-10-CM | POA: Insufficient documentation

## 2019-05-28 DIAGNOSIS — I480 Paroxysmal atrial fibrillation: Secondary | ICD-10-CM | POA: Insufficient documentation

## 2019-05-28 DIAGNOSIS — Z7982 Long term (current) use of aspirin: Secondary | ICD-10-CM | POA: Diagnosis not present

## 2019-05-28 DIAGNOSIS — Z7901 Long term (current) use of anticoagulants: Secondary | ICD-10-CM | POA: Insufficient documentation

## 2019-05-28 DIAGNOSIS — I4891 Unspecified atrial fibrillation: Secondary | ICD-10-CM

## 2019-05-28 DIAGNOSIS — I2510 Atherosclerotic heart disease of native coronary artery without angina pectoris: Secondary | ICD-10-CM

## 2019-05-28 DIAGNOSIS — I440 Atrioventricular block, first degree: Secondary | ICD-10-CM | POA: Insufficient documentation

## 2019-05-28 DIAGNOSIS — I493 Ventricular premature depolarization: Secondary | ICD-10-CM | POA: Diagnosis not present

## 2019-05-28 LAB — COMPREHENSIVE METABOLIC PANEL
ALT: 24 U/L (ref 0–44)
AST: 21 U/L (ref 15–41)
Albumin: 3.8 g/dL (ref 3.5–5.0)
Alkaline Phosphatase: 94 U/L (ref 38–126)
Anion gap: 9 (ref 5–15)
BUN: 20 mg/dL (ref 8–23)
CO2: 21 mmol/L — ABNORMAL LOW (ref 22–32)
Calcium: 9.3 mg/dL (ref 8.9–10.3)
Chloride: 108 mmol/L (ref 98–111)
Creatinine, Ser: 1.32 mg/dL — ABNORMAL HIGH (ref 0.44–1.00)
GFR calc Af Amer: 49 mL/min — ABNORMAL LOW (ref >60)
GFR calc non Af Amer: 43 mL/min — ABNORMAL LOW (ref >60)
Glucose, Bld: 107 mg/dL — ABNORMAL HIGH (ref 70–99)
Potassium: 4.5 mmol/L (ref 3.5–5.1)
Sodium: 138 mmol/L (ref 135–145)
Total Bilirubin: 0.6 mg/dL (ref 0.3–1.2)
Total Protein: 7.3 g/dL (ref 6.5–8.1)

## 2019-05-28 LAB — T4, FREE: Free T4: 0.99 ng/dL (ref 0.61–1.12)

## 2019-05-28 LAB — TSH: TSH: 0.992 u[IU]/mL (ref 0.350–4.500)

## 2019-05-28 LAB — CBC
HCT: 42.3 % (ref 36.0–46.0)
Hemoglobin: 12.8 g/dL (ref 12.0–15.0)
MCH: 23.2 pg — ABNORMAL LOW (ref 26.0–34.0)
MCHC: 30.3 g/dL (ref 30.0–36.0)
MCV: 76.8 fL — ABNORMAL LOW (ref 80.0–100.0)
Platelets: 162 10*3/uL (ref 150–400)
RBC: 5.51 MIL/uL — ABNORMAL HIGH (ref 3.87–5.11)
RDW: 17.2 % — ABNORMAL HIGH (ref 11.5–15.5)
WBC: 5.9 10*3/uL (ref 4.0–10.5)
nRBC: 0 % (ref 0.0–0.2)

## 2019-05-28 LAB — POCT INR: INR: 2.6 (ref 2.0–3.0)

## 2019-05-28 LAB — BRAIN NATRIURETIC PEPTIDE: B Natriuretic Peptide: 233.6 pg/mL — ABNORMAL HIGH (ref 0.0–100.0)

## 2019-05-28 NOTE — Progress Notes (Signed)
Advanced Heart Failure Clinic Note    PCP: Dr Valarie MerinoSunn Primary Cardiologist: Dr Duke Salviaandolph  HF Cardiologist: Dr. Gala RomneyBensimhon   HPI: Rachael Davis is a 63 y.o. 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    Admitted 6/20 though 05/28/2017 with volume overload and pleural effusion. Diuresed with IV lasix and transitioned to lasix 40 mg twice a day. Also had lots of PVCs so started on amiodarone. Discharge weight was 177 pounds.   She returns today for HF follow up. Feels pretty good. Working from home. Doing some walking in the evening. Chest still sore after surgery. Last week had some mild SOB after walking. No CP. Lasted 30 mins and resolved. Has not recurred.  No edema, orthopnea or PND. Taking lasix 3x/week. Has gained 4-5 pounds.   Echo EF 35-40% MVR ok   03/2018 Holter Monitor-4% PVC  Marked 1st degree AV block  10/2017 Holter monitor- 3% PVC burden.    ECHO 05/11/2017 - Left ventricle: The cavity size was moderately dilated. Wall thickness was increased in a pattern of mild LVH. Systolic function was moderately reduced. The estimated ejection fraction was in the range of 35% to 40%. Diffuse hypokinesis. The study is not technically sufficient to allow evaluation of LV diastolic function. - Mitral valve: Normal appearing bioprosthetic MV no perivalvular regurgitation. - Left atrium: The atrium was mildly dilated. - Atrial septum: No defect or patent foramen ovale was identified. - Pulmonary arteries: PA peak pressure: 35 mm Hg (S).  LHC 04/04/2017   Prox LAD lesion, 75 %stenosed.  Prox LAD to Mid LAD lesion, 65 %stenosed.  Mid LAD to Dist LAD lesion, 55 %stenosed.  Ramus lesion, 90 %stenosed.  Lat Ramus lesion, 50 %stenosed.  Prox Cx lesion, 100 %stenosed.  Prox RCA to Mid RCA lesion, 95 %stenosed.  There is moderate left ventricular systolic dysfunction.  LV end diastolic pressure is mildly  elevated.  The left ventricular ejection fraction is 35-45% by visual estimate.  There is mild (2+) mitral regurgitation. 1. Severe 3 vessel obstructive CAD 2. Moderate LV dysfunction 3. Mildly elevated LV EDP  Review of systems complete and found to be negative unless listed in HPI.   SH:  Social History   Socioeconomic History  . Marital status: Married    Spouse name: Not on file  . Number of children: Not on file  . Years of education: Not on file  . Highest education level: Not on file  Occupational History  . Not on file  Social Needs  . Financial resource strain: Not on file  . Food insecurity    Worry: Not on file    Inability: Not on file  . Transportation needs    Medical: Not on file    Non-medical: Not on file  Tobacco Use  . Smoking status: Never Smoker  . Smokeless tobacco: Never Used  Substance and Sexual Activity  . Alcohol use: No  . Drug use: No  . Sexual activity: Not on file  Lifestyle  . Physical activity    Days per week: Not on file    Minutes per session: Not on file  . Stress: Not on file  Relationships  . Social Musicianconnections    Talks on phone: Not on file    Gets together: Not on file    Attends religious service: Not on file    Active member of club or organization: Not on  file    Attends meetings of clubs or organizations: Not on file    Relationship status: Not on file  . Intimate partner violence    Fear of current or ex partner: Not on file    Emotionally abused: Not on file    Physically abused: Not on file    Forced sexual activity: Not on file  Other Topics Concern  . Not on file  Social History Narrative  . Not on file    FH:  Family History  Problem Relation Age of Onset  . Heart attack Father 50    Past Medical History:  Diagnosis Date  . Arrhythmia    Hx PAF, PVC's  . CHF (congestive heart failure) ([place])   . Coronary artery disease    04/04/2017 STEMI  . Diabetes ([place])   . Hyperlipidemia   . Hypertension    . New onset atrial fibrillation ([place]) 05/2017    Current Outpatient Medications  Medication Sig Dispense Refill  . acetaminophen (TYLENOL) 325 MG tablet Take 2 tablets (650 mg total) by mouth every 6 (six) hours as needed for mild pain or headache.    Marland Kitchen aspirin EC 81 MG tablet Take 81 mg by mouth daily.    Marland Kitchen atorvastatin (LIPITOR) 20 MG tablet Take 1 tablet (20 mg total) by mouth daily at 6 PM. 90 tablet 1  . dapagliflozin propanediol (FARXIGA) 10 MG TABS tablet Take 10 mg by mouth daily.    . furosemide (LASIX) 40 MG tablet Take 2 tablets (80 mg total) by mouth 3 (three) times a week. On Mon, Wed and Fri 90 tablet 3  . glipiZIDE (GLUCOTROL XL) 10 MG 24 hr tablet Take 1 tablet (10 mg total) by mouth daily with breakfast.    . JANUVIA 100 MG tablet Take 100 mg by mouth daily.    . magnesium oxide (MAG-OX) 400 MG tablet TAKE 1 TABLET BY MOUTH EVERY DAY 30 tablet 3  . metFORMIN (GLUCOPHAGE) 1000 MG tablet Take 1,000 mg by mouth 2 (two) times daily with a meal.    . sacubitril-valsartan (ENTRESTO) 97-103 MG Take 1 tablet by mouth 2 (two) times daily. 60 tablet 6  . spironolactone (ALDACTONE) 25 MG tablet TAKE ONE HALF TABLET (12.5 MG TOTAL) BY MOUTH DAILY. 45 tablet 3  . warfarin (COUMADIN) 5 MG tablet TAKE 1 TO 1 AND 1/2 TABLETS BY MOUTH DAILY AS DIRECTED BY THE COUMADIN CLIN 120 tablet 1   No current facility-administered medications for this encounter.     Vitals:   05/28/19 1344  BP: 122/86  Pulse: 97  SpO2: 97%  Weight: 88.5 kg (195 lb 3.2 oz)     Wt Readings from Last 3 Encounters:  05/28/19 88.5 kg (195 lb 3.2 oz)  11/13/18 88 kg (194 lb)  08/01/18 84.8 kg (187 lb)   PHYSICAL EXAM: General:  Well appearing. No resp difficulty HEENT: normal Neck: supple. no JVD. Carotids 2+ bilat; no bruits. No lymphadenopathy. + thyromegaly Cor: PMI nondisplaced. Mildly irregular rate & rhythm. No rubs, gallops or murmurs. Lungs: clear decreased left base Abdomen: soft, nontender,  nondistended. No hepatosplenomegaly. No bruits or masses. Good bowel sounds. Extremities: no cyanosis, clubbing, rash, edema Neuro: alert & orientedx3, cranial nerves grossly intact. moves all 4 extremities w/o difficulty. Affect pleasant  ASSESSMENT & PLAN:  1.Chronic Systolic Heart Failure: Ischemic cardiomyopathy - Echo 02/06/2018 LVEF 35-40% - Echo today viewed personally EF 35-40% - Stable. NYHA II symptoms - Volume status stable on exam.  -  Continue lasix 80 mg 3 times a week. Reviewed sliding scale diuretics.  - Continue Spiro 12.5 mg hs.  - Off bb with long pr interval.    - Continue entresto 97/103 mg twice a day.  - Continue Farxiga   2. CAD S/P CABG x4: - No clear s/s ischemia. If she has more episodes of exertional SOB will need at least a stress test.  - Stable, non-exertional chest "numbness". Pain has been constant since CABG. Likely neuropathic pain - Continue statin and ASA.  3. Bioprosthetic MVR:  - Stable.  - Continue warfarin and ASA. Denies bleeding. - Reminded about SBE prophylaxis  4. PVCs:  - Amio stopped per Dr. Norina Buzzard. - 03/2018 Holter Monitor-4% PVC  Marked 1st degree AV block olter monitor 10/08/17 with 3 % PVC burden.  - Stable.  5. PAF - Mildly irregular on exam. Check ECG - Continue warfarin. Denies bleeding  6. DM2 - Hgb A1C 6.2 07/26/18. Continue farxiga  7. Goiter - check thyroid u/s and TFTs  Can graduate from HF program and f/u with Dr. Duke Salvia. We are happy to see back as needed.   Arvilla Meres, MD  2:06 PM

## 2019-05-28 NOTE — Patient Instructions (Addendum)
Lab work done today. We will notify you of any abnormal lab work. No news is good news!  EKG done today.  Your physician suggests that you have a x-ray of your chest done. A chest x-ray takes a picture of the organs and structures inside the chest, including the heart, lungs, and blood vessels. This test can show several things, including, whether the heart is enlarges; whether fluid is building up in the lungs; and whether pacemaker / defibrillator leads are still in place.  Your physician suggest you get an ultrasound of your neck in order to evaluate your thyroid.   Please follow up with Dr. Oval Linsey with [facility] at [facility]. That office will contact your in order to set up an appointment.

## 2019-05-28 NOTE — Progress Notes (Signed)
  Echocardiogram 2D Echocardiogram has been performed.  Jennette Dubin 05/28/2019, 1:43 PM

## 2019-06-02 ENCOUNTER — Ambulatory Visit (HOSPITAL_COMMUNITY): Payer: 59

## 2019-06-08 ENCOUNTER — Other Ambulatory Visit: Payer: Self-pay | Admitting: Cardiovascular Disease

## 2019-06-16 ENCOUNTER — Other Ambulatory Visit: Payer: Self-pay

## 2019-06-16 ENCOUNTER — Ambulatory Visit (HOSPITAL_COMMUNITY)
Admission: RE | Admit: 2019-06-16 | Discharge: 2019-06-16 | Disposition: A | Discharge status: Home / Self Care | Payer: 59 | Source: Ambulatory Visit | Attending: Internal Medicine | Admitting: Internal Medicine

## 2019-06-16 DIAGNOSIS — I5043 Acute on chronic combined systolic (congestive) and diastolic (congestive) heart failure: Secondary | ICD-10-CM | POA: Diagnosis present

## 2019-06-19 ENCOUNTER — Other Ambulatory Visit (HOSPITAL_COMMUNITY): Payer: Self-pay | Admitting: *Deleted

## 2019-06-19 MED ORDER — SACUBITRIL-VALSARTAN 97-103 MG PO TABS: 1.0000 | ORAL_TABLET | Freq: Two times a day (BID) | ORAL | 6 refills | Status: DC

## 2019-06-20 ENCOUNTER — Other Ambulatory Visit: Payer: Self-pay | Admitting: Cardiovascular Disease

## 2019-06-25 ENCOUNTER — Telehealth (HOSPITAL_COMMUNITY): Payer: Self-pay

## 2019-06-25 DIAGNOSIS — E042 Nontoxic multinodular goiter: Secondary | ICD-10-CM

## 2019-06-25 NOTE — Telephone Encounter (Signed)
-----   Message from Jolaine Artist, MD sent at 06/21/2019 10:35 PM EDT ----- Please refer to Dr. Monna Fam in endocrine to evaluate.

## 2019-06-25 NOTE — Telephone Encounter (Signed)
Pt aware of results of Thyroid ultrasound. Referral placed with Dr Luisa Dago office to evaluate. Pt verbalized understanding and appreciative

## 2019-07-02 ENCOUNTER — Ambulatory Visit (INDEPENDENT_AMBULATORY_CARE_PROVIDER_SITE_OTHER): Payer: 59 | Admitting: Pharmacist

## 2019-07-02 ENCOUNTER — Telehealth (HOSPITAL_COMMUNITY): Payer: Self-pay | Admitting: Cardiology

## 2019-07-02 ENCOUNTER — Other Ambulatory Visit: Payer: Self-pay

## 2019-07-02 DIAGNOSIS — Z7901 Long term (current) use of anticoagulants: Secondary | ICD-10-CM | POA: Diagnosis not present

## 2019-07-02 DIAGNOSIS — Z951 Presence of aortocoronary bypass graft: Secondary | ICD-10-CM | POA: Diagnosis not present

## 2019-07-02 DIAGNOSIS — I4891 Unspecified atrial fibrillation: Secondary | ICD-10-CM

## 2019-07-02 LAB — POCT INR: INR: 3.5 — AB (ref 2.0–3.0)

## 2019-07-02 NOTE — Telephone Encounter (Signed)
Patient called to check the status of referral placed to endocrinology Dr Gardenia Phlegm  Advised she may have some delay as office may be have reduced hours and or staff with COVID, phone number given to office should she desire to call to check the statis of referral.  Patient appreciative of return call and nothing further needed at this time.

## 2019-07-29 ENCOUNTER — Telehealth (HOSPITAL_COMMUNITY): Payer: Self-pay | Admitting: Pharmacy Technician

## 2019-07-29 NOTE — Telephone Encounter (Signed)
Received a partial application to receive Entresto through [employer]. Called patient to verify that she sent in the patient portion, had to leave message.   Patient called back while I was on another call and requested that I send in the Provider portion for her and send a copy to her as well. Called patient and left another message trying to confirm that we have her most recent insurance card on file as it is from 2019 and to go over income documentation that [employer] requires.  Will follow up  [name], CPhT

## 2019-08-06 NOTE — Telephone Encounter (Signed)
Sent in provider portion of [place] application to [employer].  Will follow up.  [name], CPhT

## 2019-08-07 ENCOUNTER — Ambulatory Visit (INDEPENDENT_AMBULATORY_CARE_PROVIDER_SITE_OTHER): Payer: 59 | Admitting: Pharmacist Clinician (PhC)/ Clinical Pharmacy Specialist

## 2019-08-07 ENCOUNTER — Other Ambulatory Visit: Payer: Self-pay

## 2019-08-07 DIAGNOSIS — I4891 Unspecified atrial fibrillation: Secondary | ICD-10-CM

## 2019-08-07 DIAGNOSIS — Z7901 Long term (current) use of anticoagulants: Secondary | ICD-10-CM

## 2019-08-07 DIAGNOSIS — Z951 Presence of aortocoronary bypass graft: Secondary | ICD-10-CM | POA: Diagnosis not present

## 2019-08-07 LAB — POCT INR: INR: 1.9 — AB (ref 2.0–3.0)

## 2019-08-14 NOTE — Telephone Encounter (Signed)
Called to check the status of patient's Entresto application, [employer] states that they did not receive the September 2 fax with the providers portion.   Will fax that back in again and continue to follow  [name], CPhT

## 2019-08-19 NOTE — Telephone Encounter (Signed)
Called to check the status of application for [employer]. Representative stated that the application was still in progress. Advised me to call back tomorrow and should have a decision.  [name], CPhT

## 2019-08-21 ENCOUNTER — Other Ambulatory Visit: Payer: Self-pay

## 2019-08-21 NOTE — Telephone Encounter (Signed)
[employer] and the representative stated that they had all the materials needed. Apparently someone had made this patient more than one profile so that's why they thought things were missing. She said all that is left is the benefits investigation. Advised me to call back next Tuesday.   Called patient to inform her of the status update. Had to leave a message.   Will continue to follow.  [name], CPhT

## 2019-08-25 ENCOUNTER — Other Ambulatory Visit: Payer: Self-pay

## 2019-08-25 ENCOUNTER — Ambulatory Visit (INDEPENDENT_AMBULATORY_CARE_PROVIDER_SITE_OTHER): Payer: 59 | Admitting: Internal Medicine

## 2019-08-25 ENCOUNTER — Encounter: Payer: Self-pay | Admitting: Internal Medicine

## 2019-08-25 VITALS — BP 138/80 | HR 88 | Ht 62.75 in | Wt 197.0 lb

## 2019-08-25 DIAGNOSIS — E042 Nontoxic multinodular goiter: Secondary | ICD-10-CM | POA: Diagnosis not present

## 2019-08-25 NOTE — Patient Instructions (Signed)
Please let me know if you are not called for the biopsy schedule within 1 week.  Please come back for a follow-up appointment in 1 year.   Thyroid Nodule  A thyroid nodule is an isolated growth of thyroid cells that forms a lump in your thyroid gland. The thyroid gland is a butterfly-shaped gland. It is found in the lower front of your neck. This gland sends chemical messengers (hormones) through your blood to all parts of your body. These hormones are important in regulating your body temperature and helping your body to use energy. Thyroid nodules are common. Most are not cancerous (benign). You may have one nodule or several nodules. Different types of thyroid nodules include nodules that:  Grow and fill with fluid (thyroid cysts).  Produce too much thyroid hormone (hot nodules or hyperthyroid).  Produce no thyroid hormone (cold nodules or hypothyroid).  Form from cancer cells (thyroid cancers). What are the causes? In most cases, the cause of this condition is not known. What increases the risk? The following factors may make you more likely to develop this condition.  Age. Thyroid nodules become more common in people who are older than 64 years of age.  Gender. ? Benign thyroid nodules are more common in women. ? Cancerous (malignant) thyroid nodules are more common in men.  A family history that includes: ? Thyroid nodules. ? Pheochromocytoma. ? Thyroid carcinoma. ? Hyperparathyroidism.  Certain kinds of thyroid diseases, such as Hashimoto's thyroiditis.  Lack of iodine in your diet.  A history of head and neck radiation, such as from previous cancer treatment. What are the signs or symptoms? In many cases, there are no symptoms. If you have symptoms, they may include:  A lump in your lower neck.  Feeling a lump or tickle in your throat.  Pain in your neck, jaw, or ear.  Having trouble swallowing. Hot nodules may cause symptoms that include:  Weight loss.   Warm, flushed skin.  Feeling hot.  Feeling nervous.  A racing heartbeat. Cold nodules may cause symptoms that include:  Weight gain.  Dry skin.  Brittle hair. This may also occur with hair loss.  Feeling cold.  Fatigue. Thyroid cancer nodules may cause symptoms that include:  Hard nodules that feel stuck to the thyroid gland.  Hoarseness.  Lumps in the glands near your thyroid (lymph nodes). How is this diagnosed? A thyroid nodule may be felt by your health care provider during a physical exam. This condition may also be diagnosed based on your symptoms. You may also have tests, including:  An ultrasound. This may be done to confirm the diagnosis.  A biopsy. This involves taking a sample from the nodule and looking at it under a microscope.  Blood tests to make sure that your thyroid is working properly.  A thyroid scan. This test uses a radioactive tracer injected into a vein to create an image of the thyroid gland on a computer screen.  Imaging tests such as MRI or CT scan. These may be done if: ? Your nodule is large. ? Your nodule is blocking your airway. ? Cancer is suspected. How is this treated? Treatment depends on the cause and size of your nodule or nodules. If the nodule is benign, treatment may not be necessary. Your health care provider may monitor the nodule to see if it goes away without treatment. If the nodule continues to grow, is cancerous, or does not go away, treatment may be needed. Treatment may include:  Having  a cystic nodule drained with a needle.  Ablation therapy. In this treatment, alcohol is injected into the area of the nodule to destroy the cells. Ablation with heat (thermal ablation) may also be used.  Radioactive iodine. In this treatment, radioactive iodine is given as a pill or liquid that you drink. This substance causes the thyroid nodule to shrink.  Surgery to remove the nodule. Part or all of your thyroid gland may need to be  removed as well.  Medicines. Follow these instructions at home:  Pay attention to any changes in your nodule.  Take over-the-counter and prescription medicines only as told by your health care provider.  Keep all follow-up visits as told by your health care provider. This is important. Contact a health care provider if:  Your voice changes.  You have trouble swallowing.  You have pain in your neck, ear, or jaw that is getting worse.  Your nodule gets bigger.  Your nodule starts to make it harder for you to breathe.  Your muscles look like they are shrinking (muscle wasting). Get help right away if:  You have chest pain.  There is a loss of consciousness.  You have a sudden fever.  You feel confused.  You are seeing or hearing things that other people do not see or hear (having hallucinations).  You feel very weak.  You have mood swings.  You feel very restless.  You feel suddenly nauseous or throw up.  You suddenly have diarrhea. Summary  A thyroid nodule is an isolated growth of thyroid cells that forms a lump in your thyroid gland.  Thyroid nodules are common. Most are not cancerous (benign). You may have one nodule or several nodules.  Treatment depends on the cause and size of your nodule or nodules. If the nodule is benign, treatment may not be necessary.  Your health care provider may monitor the nodule to see if it goes away without treatment. If the nodule continues to grow, is cancerous, or does not go away, treatment may be needed. This information is not intended to replace advice given to you by your health care provider. Make sure you discuss any questions you have with your health care provider. Document Released: 10/13/2004 Document Revised: 07/05/2018 Document Reviewed: 07/08/2018 Elsevier Patient Education  2020 [employer].

## 2019-08-25 NOTE — Progress Notes (Addendum)
Patient ID: Rachael Davis, female   DOB: [birth year], 63 y.o.   MRN: [id number]    HPI  Rachael Davis is a 63 y.o.-year-old female, referred by her cardiologist, Dr. Gala Romney, for evaluation for thyroid nodules.  She was evaluated by Dr. Gala Romney this summer >> he palpated a thyroid enlargement >> sent for thyroid U/S.  Thyroid U/S (06/16/2019): 4 nodules, of which the dominant right inferior nodule measured 4.5 x 3.9 x 2.8 cm, isoechoic.  Biopsy was recommended.  The rest of the nodules a one-year follow-up ultrasound was recommended.        Pt denies: - feeling nodules in neck - hoarseness - dysphagia - choking - SOB with lying down  I reviewed pt's thyroid tests -normal: Lab Results  Component Value Date   TSH 0.992 05/28/2019   TSH 0.490 05/23/2017   TSH 0.264 (L) 04/06/2017   FREET4 0.99 05/28/2019   FREET4 1.07 04/07/2017  She had steroids around the time of her AMI in 2018.  Pt mentions: Fatigue Weight gain Heat intolerance Palpitations - none new  She denies: Tremors Weight loss Hair loss Hyper defecation Anxiety/depression  No FH of thyroid ds. No FH of thyroid cancer. No h/o radiation tx to head or neck.  No seaweed or kelp. No recent contrast studies. No steroid use. No herbal supplements. No Biotin supplements or Hair, Skin and Nails vitamins.  Pt has well controlled DM2 - last HbA1c is 6.3% (07/28/2019), prev. 6.2%.  This is managed by PCP.    She also has CAD + STEMI 04/2017, s/p CABG x4, CHF, HL, HTN, CKD.  Father with heart ds and stroke, mother with HTN.   ROS: Constitutional: + See HPI, + nocturia Eyes: no blurry vision, no xerophthalmia ENT: no sore throat,  + see HPI, + hypoacusis, + tinnitus Cardiovascular: no CP/SOB/+ palpitations/no leg swelling Respiratory: + Cough/no SOB Gastrointestinal: no N/V/D/C Musculoskeletal: no muscle/+ joint aches Skin: no rashes Neurological: no tremors/numbness/tingling/dizziness Psychiatric: no  depression/anxiety  Past Medical History:  Diagnosis Date  . Arrhythmia    Hx PAF, PVC's  . CHF (congestive heart failure) (HCC)   . Coronary artery disease    04/04/2017 STEMI  . Diabetes (HCC)   . Hyperlipidemia   . Hypertension   . New onset atrial fibrillation (HCC) 05/2017   Past Surgical History:  Procedure Laterality Date  . CORONARY ARTERY BYPASS GRAFT N/A 04/12/2017   Procedure: CORONARY ARTERY BYPASS GRAFTING (CABG), ON PUMP, TIMES FOUR, USING LEFT INTERNAL MAMMARY ARTERY AND ENDOSCOPICALLY HARVESTED RIGHT GREATER SAPHENOUS VEIN;  Surgeon: Kerin Perna, MD;  Location[facility] OR;  Service: Open Heart Surgery;  Laterality: N/A;  LIMA to LAD; SVG to OM; SVG to Ramus; SVG to Right  . IR THORACENTESIS ASP PLEURAL SPACE W/IMG GUIDE  05/25/2017  . LEFT HEART CATH AND CORONARY ANGIOGRAPHY N/A 04/04/2017   Procedure: Left Heart Cath and Coronary Angiography;  Surgeon: Peter M [name], MD;  Location: [facility] INVASIVE CV LAB;  Service: Cardiovascular;  Laterality: N/A;  . MITRAL VALVE REPAIR N/A 04/12/2017   Procedure: MITRAL VALVE REPLACEMENT (MVR);  Surgeon: Donata Clay, Theron Arista, MD;  Location: [facility] OR;  Service: Open Heart Surgery;  Laterality: N/A;  Using a 36mm Magna Ease Mitral Pericardial Bioprosthesis  . TEE WITHOUT CARDIOVERSION N/A 04/12/2017   Procedure: TRANSESOPHAGEAL ECHOCARDIOGRAM (TEE);  Surgeon: Donata Clay, Theron Arista, MD;  Location: [facility] OR;  Service: Open Heart Surgery;  Laterality: N/A;  . TUBAL LIGATION     Social History  Socioeconomic History  . Marital status: Married    Spouse name: Not on file  . Number of children: Not on file  . Years of education: Not on file  . Highest education level: Not on file  Occupational History  . Not on file  Social Needs  . Financial resource strain: Not on file  . Food insecurity    Worry: Not on file    Inability: Not on file  . Transportation needs    Medical: Not on file    Non-medical: Not on file  Tobacco Use  . Smoking status: Never  Smoker  . Smokeless tobacco: Never Used  Substance and Sexual Activity  . Alcohol use: No  . Drug use: No  . Sexual activity: Not on file  Lifestyle  . Physical activity    Days per week: Not on file    Minutes per session: Not on file  . Stress: Not on file  Relationships  . Social Musicianconnections    Talks on phone: Not on file    Gets together: Not on file    Attends religious service: Not on file    Active member of club or organization: Not on file    Attends meetings of clubs or organizations: Not on file    Relationship status: Not on file  . Intimate partner violence    Fear of current or ex partner: Not on file    Emotionally abused: Not on file    Physically abused: Not on file    Forced sexual activity: Not on file  Other Topics Concern  . Not on file  Social History Narrative  . Not on file   Current Outpatient Medications on File Prior to Visit  Medication Sig Dispense Refill  . acetaminophen (TYLENOL) 325 MG tablet Take 2 tablets (650 mg total) by mouth every 6 (six) hours as needed for mild pain or headache.    Marland Kitchen. aspirin EC 81 MG tablet Take 81 mg by mouth daily.    Marland Kitchen. atorvastatin (LIPITOR) 20 MG tablet TAKE 1 TABLET BY MOUTH DAILY AT 6 PM. 90 tablet 1  . dapagliflozin propanediol (FARXIGA) 10 MG TABS tablet Take 10 mg by mouth daily.    . furosemide (LASIX) 40 MG tablet Take 2 tablets (80 mg total) by mouth 3 (three) times a week. On Mon, Wed and Fri 90 tablet 3  . glipiZIDE (GLUCOTROL XL) 10 MG 24 hr tablet Take 1 tablet (10 mg total) by mouth daily with breakfast.    . magnesium oxide (MAG-OX) 400 MG tablet TAKE 1 TABLET BY MOUTH EVERY DAY 30 tablet 3  . metFORMIN (GLUCOPHAGE) 1000 MG tablet Take 1,000 mg by mouth 2 (two) times daily with a meal.    . sacubitril-valsartan (ENTRESTO) 97-103 MG Take 1 tablet by mouth 2 (two) times daily. 60 tablet 6  . spironolactone (ALDACTONE) 25 MG tablet TAKE ONE HALF TABLET (12.5 MG TOTAL) BY MOUTH DAILY. 45 tablet 3  .  warfarin (COUMADIN) 5 MG tablet TAKE 1 TO 1 AND 1/2 TABLETS BY MOUTH DAILY AS DIRECTED BY THE COUMADIN CLIN 120 tablet 1  . JANUVIA 100 MG tablet Take 100 mg by mouth daily.     No current facility-administered medications on file prior to visit.    Allergies  Allergen Reactions  . Keflex [Cephalexin] Rash    Skin on feet peeled.  . Lisinopril Cough   Family History  Problem Relation Age of Onset  . Heart attack Father 660  PE: BP 138/80   Pulse 88   Ht 5' 2.75" (1.594 m) Comment: measured without shoes  Wt 197 lb (89.4 kg)   SpO2 99%   BMI 35.18 kg/m  Wt Readings from Last 3 Encounters:  08/25/19 197 lb (89.4 kg)  05/28/19 195 lb 3.2 oz (88.5 kg)  11/13/18 194 lb (88 kg)   Constitutional: overweight, in NAD Eyes: PERRLA, EOMI, no exophthalmos ENT: moist mucous membranes, no thyromegaly, no cervical lymphadenopathy Cardiovascular: RRR, No MRG Respiratory: CTA B Gastrointestinal: abdomen soft, NT, ND, BS+ Musculoskeletal: no deformities, strength intact in all 4;  Skin: moist, warm, no rashes Neurological: no tremor with outstretched hands, DTR normal in all 4  ASSESSMENT: 1. Thyroid nodules  Thyroid ultrasound (07/03/2019): Parenchymal Echotexture: Mildly heterogenous Isthmus: Borderline enlarged measuring 1.1 cm in diameter Right lobe: Enlarged measuring 7.3 x 2.8 x 3.4 cm Left lobe: Enlarged measuring 7.3 x 2.8 x 3.1 cm _________________________________________________________  Estimated total number of nodules >/= 1 cm: 4 _________________________________________________________  Nodule # 1: Location: Right; Mid Maximum size: 1.9 cm; Other 2 dimensions: 1.5 x 1.0 cm Composition: solid/almost completely solid (2) Echogenicity: hyperechoic (1) ACR TI-RADS total points: 3. *Given size (>/= 1.5 - 2.4 cm) and appearance, a follow-up ultrasound in 1 year should be considered based on TI-RADS criteria. _________________________________________________________   Nodule # 2: Location: Right; Inferior Maximum size: 4.5 cm; Other 2 dimensions: 3.9 x 2.8 cm Composition: solid/almost completely solid (2) Echogenicity: isoechoic (1) ACR TI-RADS total points: 3. **Given size (>/= 2.5 cm) and appearance, fine needle aspiration of this mildly suspicious nodule should be considered based on TI-RADS criteria. _______________________________________________________  Nodule # 3: Location: Left; Superior Maximum size: 1.1 cm; Other 2 dimensions: 0.7 x 0.2 cm Composition: solid/almost completely solid (2) Echogenicity: hypoechoic (2) ACR TI-RADS total points: 4. *Given size (>/= 1 - 1.4 cm) and appearance, a follow-up ultrasound in 1 year should be considered based on TI-RADS criteria. _________________________________________________________  Nodule # 4: Location: Left; Mid Maximum size: 3.1 cm; Other 2 dimensions: 2.8 x 2.2 cm Composition: mixed cystic and solid (1) Echogenicity: isoechoic (1) ACR TI-RADS total points: 2. This nodule does NOT meet TI-RADS criteria for biopsy or dedicated follow-up. _____________________________________________________  IMPRESSION: 1. Findings suggestive of multinodular goiter. 2. Nodule #2 meets imaging criteria to recommend percutaneous sampling as clinically indicated. 3. Nodules #1 and #3 both meet imaging criteria to recommend a 1 year follow-up.  PLAN: 1. Thyroid nodules - I reviewed the images and the report of her thyroid ultrasound along with the patient. I pointed out that the right, dominant, nodule is large, this being a risk factor for cancer.  Otherwise, the nodules are: - not hypoechoic (it is isoechoic) - without microcalcifications - without internal blood flow - more wide than tall - well delimited from surrounding tissue The rest of the nodules appear complex (solid/cystic). Pt does not have a thyroid cancer family history or a personal history of RxTx to head/neck. All these would  favor benignity.  - the only way that we can tell exactly if the dominant nodule is cancerous or not is by doing a thyroid biopsy (FNA). I explained what the test entails. - patient decided to have the FNA done now >> I ordered this.  - I explained that if this is not cancer, we can continue to follow her on a yearly basis, and check another ultrasound in another year or 2. In this case, given she develops neck compression symptoms, we might need to  do either lobectomy or thyroidectomy. While thyroid surgery is not a complicated one, it still can have side effects and also she might have a risk of ~25% of becoming hypothyroid after hemithyroidectomy.  - I will see her back in a year, assuming her FNA is benign. If FNA abnormal, we will meet sooner.  We did discuss the thyroid cancer is a slow-growing cancer, and usually thyroidectomy is curative. - I advised pt to join my chart and I will send her the results through there, however, she does not have a computer or smart phone and she prefers to be called with the results.  Orders Placed This Encounter  Procedures  . [place] FNA BX THYROID 1ST LESION AFIRMA   CYTOLOGY - NON PAP  CASE: [id number]  PATIENT: Nafisa Barth  Non-Gynecological Cytology Report   Clinical History: Right inferior 4.5cm; Other 2 dimensions: 3.9 x 2.8cm,  Solid / almost completely solid, Isoechoic, TI-RADS total points 3  Specimen Submitted: A. THYROID, RLP, FINE NEEDLE ASPIRATION:    DIAGNOSIS:  - Consistent with benign follicular nodule (Bethesda category II)   SPECIMEN ADEQUACY:  Satisfactory for evaluation   [name], MD PhD [facility] Endocrinology

## 2019-08-26 NOTE — Telephone Encounter (Signed)
Patient called in wanting an update in the status of her application. [employer] and requested a Freight forwarder as it has been almost a month. They attempted to push the application through and have resent it back to the insurance check team. Told me we would have an answer withing 1-2 business days.   Will call back on Thursday.  [name], CPhT

## 2019-08-29 NOTE — Telephone Encounter (Signed)
Patient was approved to receive Entresto through [employer].  ID: [id number]  Effective Dates: 08/28/2019 through 08/27/2020.  Called and spoke with patient she was pleased with outcome. Provided her the phone number to [employer] and advised her to call Monday to set up shipment. Told her to call me with any issues she has with [employer].  [name], CPhT

## 2019-09-10 ENCOUNTER — Other Ambulatory Visit: Payer: Self-pay

## 2019-09-10 ENCOUNTER — Ambulatory Visit
Admission: RE | Admit: 2019-09-10 | Discharge: 2019-09-10 | Disposition: A | Discharge status: Home / Self Care | Payer: 59 | Source: Ambulatory Visit | Attending: Internal Medicine | Admitting: Internal Medicine

## 2019-09-10 ENCOUNTER — Other Ambulatory Visit (HOSPITAL_COMMUNITY)
Admission: RE | Admit: 2019-09-10 | Discharge: 2019-09-10 | Disposition: A | Discharge status: Home / Self Care | Payer: 59 | Source: Ambulatory Visit | Attending: Physician Assistant | Admitting: Physician Assistant

## 2019-09-10 ENCOUNTER — Ambulatory Visit (INDEPENDENT_AMBULATORY_CARE_PROVIDER_SITE_OTHER): Payer: 59 | Admitting: Pharmacist Clinician (PhC)/ Clinical Pharmacy Specialist

## 2019-09-10 DIAGNOSIS — Z951 Presence of aortocoronary bypass graft: Secondary | ICD-10-CM

## 2019-09-10 DIAGNOSIS — I4891 Unspecified atrial fibrillation: Secondary | ICD-10-CM

## 2019-09-10 DIAGNOSIS — E041 Nontoxic single thyroid nodule: Secondary | ICD-10-CM | POA: Diagnosis present

## 2019-09-10 DIAGNOSIS — E042 Nontoxic multinodular goiter: Secondary | ICD-10-CM

## 2019-09-10 DIAGNOSIS — Z7901 Long term (current) use of anticoagulants: Secondary | ICD-10-CM

## 2019-09-10 LAB — POCT INR: INR: 1.1 — AB (ref 2.0–3.0)

## 2019-09-10 NOTE — Procedures (Signed)
PROCEDURE SUMMARY:  Using direct ultrasound guidance, 5 passes were made using 25 g needles into the nodule within the right lobe of the thyroid.   Ultrasound was used to confirm needle placements on all occasions.   EBL = trace  Specimens were sent to Pathology for analysis.  See procedure note under Imaging tab in Epic for full procedure details.  Antonious Omahoney S Ellarie Picking PA-C 09/10/2019 4:13 PM

## 2019-09-11 LAB — CYTOLOGY - NON PAP

## 2019-09-12 ENCOUNTER — Telehealth: Payer: Self-pay

## 2019-09-12 NOTE — Telephone Encounter (Signed)
Notified patient of message from Dr. Gherghe, patient expressed understanding and agreement. No further questions.  

## 2019-09-12 NOTE — Telephone Encounter (Signed)
No, I meant to route to myself.   Left message for patient to return our call at [phone].

## 2019-09-12 NOTE — Telephone Encounter (Signed)
-----   Message from [name], MD sent at 09/11/2019  5:04 PM EDT ----- Rachael Davis, can you please call pt: Good news: Her thyroid nodule biopsy returned and it is benign.  I will see the patient back in a year.  No intervention needed for now.

## 2019-09-12 NOTE — Telephone Encounter (Signed)
Do I need to do anything with this?

## 2019-09-18 NOTE — Progress Notes (Signed)
Cardiology Office Note   Date:  09/19/2019   ID:  Rachael Davis, DOB 1956-08-22, MRN [id number]  PCP:  Deatra JamesSun, Vyvyan, MD  Cardiologist:   Chilton Siiffany [place], MD  Heart Failure: Dr. Gala RomneyBensimhon  No chief complaint on file.    History of Present Illness: Rachael Davis is a 63 y.o. 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  Rachael Davis presented 04/2017 with STEMI.  Left heart catheterization revealed 100% left circumflex, 95% proximal RCA, 90% ramus intermedius, and 65% LAD.   LVEF was 35-45%.   Her echo 04/05/17 revealed LVEF 45% with lateral hypokinesis and mild MR.  She underwent four-vessel CABG (LIMA-->LAD, SVG-->RI, SVG-->OM, SVG-->RCA) on 04/16/17. In the OR she was also noted to have moderate to severe mitral regurgitation so she also underwent replacement of the mitral valve with a 25 mm [facility]ase bioprosthetic valve.  During the hospitalization she required diuresis for acute systolic and diastolic heart failure.  She also had a brief episode of atrial fibrillation that resolved with metoprolol.  She saw Corine ShelterLuke Kilroy in clinic 05/08/17 and was volume overloaded.  Lasix and metoprolol were increased.  She followed up with Dr. Donata ClayVan Trigt 05/23/17 and was found to be in atrial fibrillation.  She had a repeat echocardiogram 05/24/17 that revealed LVEF 35-40% with akinesis of the inferolateral myocardium and basal inferior myocardium. Her bioprosthetic mitral valve was functioning well. There was a small mobile target on the prosthesis that was thought to be residual native valve.  She was admitted and the heart failure team was consulted.  She spontaneously converted to sinus tachycardia. Metoprolol was discontinued 2/2 low output and she was started on amiodarone.  She underwent L thoracentesis.  Her discharge weight was 177 pounds. She was started on carvedilol  3.125mg  bid.  She wore a 48 hour Holter 10/2017 that showed 3% PVCs.  Since her last appointment with me she has seen the HF team multiple times.  She had a repeat echo 02/2018 that revealed LVEF 35-40% with a mean gradient across the mitral valve of 15 mmHg.  The gradient decrased to 8 mmHg on subsequent echo in 2020.  She reported palpitations and wore an ambulatory monitor 03/2018 that showed PVCs but no atrial fibrillation.  She last saw Dr. Gala RomneyBensimhon 05/2019 and was stable.  Overall she has been feeling OK.  She notes taht on some days she struggles with her breathing.  She tries to walk for exercise.  Last week she had to stop on her usual route but usually she can complete it without stopping.  She has no lower extremity edema, orthopena or PND.  She has no exertional chest pain.  She has completed the same walk since then and had no dyspnea.  Her weight has been stable.  She does not weight herself daily.  Her PCP recommended that she start Januvia but she has not yet started it because she thinks Dr. Gala RomneyBensimhon told her not to take it.  She notes that she feels her heart fluttering at times.  This is especially prominent when she is laying in bed at night and when she turns.  She had a thyroid biopsy that was negative for malignancy.   Past Medical History:  Diagnosis Date   Arrhythmia    Hx PAF, PVC's   CHF (congestive heart failure) (HCC)    Coronary artery disease  04/04/2017 STEMI   Diabetes ([place])    Hyperlipidemia    Hypertension    New onset atrial fibrillation ([place]) 05/2017    Past Surgical History:  Procedure Laterality Date   CORONARY ARTERY BYPASS GRAFT N/A 04/12/2017   Procedure: CORONARY ARTERY BYPASS GRAFTING (CABG), ON PUMP, TIMES FOUR, USING LEFT INTERNAL MAMMARY ARTERY AND ENDOSCOPICALLY HARVESTED RIGHT GREATER SAPHENOUS VEIN;  Surgeon: Ivin Poot, MD;  Location: [place];  Service: Open Heart Surgery;  Laterality: N/A;  LIMA to LAD; SVG to OM; SVG to Ramus; SVG to  Right   IR THORACENTESIS ASP PLEURAL SPACE W/IMG GUIDE  05/25/2017   LEFT HEART CATH AND CORONARY ANGIOGRAPHY N/A 04/04/2017   Procedure: Left Heart Cath and Coronary Angiography;  Surgeon: Peter M Martinique, MD;  Location: [facility] [facility];  Service: Cardiovascular;  Laterality: N/A;   MITRAL VALVE REPAIR N/A 04/12/2017   Procedure: MITRAL VALVE REPLACEMENT (MVR);  Surgeon: Prescott Gum, Collier Salina, MD;  Location: [place];  Service: Open Heart Surgery;  Laterality: N/A;  Using a 74mm Magna Ease Mitral Pericardial Bioprosthesis   TEE WITHOUT CARDIOVERSION N/A 04/12/2017   Procedure: TRANSESOPHAGEAL ECHOCARDIOGRAM (TEE);  Surgeon: Prescott Gum, Collier Salina, MD;  Location: [facility] [facility];  Service: Open Heart Surgery;  Laterality: N/A;   TUBAL LIGATION       Current Outpatient Medications  Medication Sig Dispense Refill   acetaminophen (TYLENOL) 325 MG tablet Take 2 tablets (650 mg total) by mouth every 6 (six) hours as needed for mild pain or headache.     aspirin EC 81 MG tablet Take 81 mg by mouth daily.     atorvastatin (LIPITOR) 20 MG tablet TAKE 1 TABLET BY MOUTH DAILY AT 6 PM. 90 tablet 1   dapagliflozin propanediol (FARXIGA) 10 MG TABS tablet Take 10 mg by mouth daily.     furosemide (LASIX) 40 MG tablet Take 2 tablets (80 mg total) by mouth 3 (three) times a week. On Mon, Wed and Fri 90 tablet 3   glipiZIDE (GLUCOTROL XL) 10 MG 24 hr tablet Take 1 tablet (10 mg total) by mouth daily with breakfast.     JANUVIA 100 MG tablet Take 100 mg by mouth daily. Patient has not started yet     magnesium oxide (MAG-OX) 400 MG tablet TAKE 1 TABLET BY MOUTH EVERY DAY 30 tablet 3   metFORMIN (GLUCOPHAGE) 1000 MG tablet Take 1,000 mg by mouth 2 (two) times daily with a meal.     sacubitril-valsartan (ENTRESTO) 97-103 MG Take 1 tablet by mouth 2 (two) times daily. 60 tablet 6   spironolactone (ALDACTONE) 25 MG tablet TAKE ONE HALF TABLET (12.5 MG TOTAL) BY MOUTH DAILY. 45 tablet 3   warfarin (COUMADIN) 5 MG tablet  TAKE 1 TO 1 AND 1/2 TABLETS BY MOUTH DAILY AS DIRECTED BY THE COUMADIN CLIN 120 tablet 1   No current facility-administered medications for this visit.     Allergies:   Keflex [cephalexin] and Lisinopril    Social History:  The patient  reports that she has never smoked. She has never used smokeless tobacco. She reports that she does not drink alcohol or use drugs.   Family History:  The patient's family history includes Heart attack (age of onset: 68) in her father.    ROS:  Please see the history of present illness.   Otherwise, review of systems are positive for none.   All other systems are reviewed and negative.    PHYSICAL EXAM: VS:  BP 124/83  Pulse 90    Temp (!) 97.2 F (36.2 C)    Ht 5' 2.5" (1.588 m)    Wt 196 lb 6.4 oz (89.1 kg)    SpO2 99%    BMI 35.35 kg/m  , BMI Body mass index is 35.35 kg/m. GENERAL:  Well appearing HEENT: Pupils equal round and reactive, fundi not visualized, oral mucosa unremarkable NECK:  No jugular venous distention, waveform within normal limits, carotid upstroke brisk and symmetric, no bruits, + thyromegaly LUNGS:  Clear to auscultation bilaterally CHEST: Well-healed midline sternotomy incision HEART:  RRR.  PMI not displaced or sustained,S1 and S2 within normal limits, no S3, no S4, no clicks, no rubs, no murmurs ABD:  Flat, positive bowel sounds normal in frequency in pitch, no bruits, no rebound, no guarding, no midline pulsatile mass, no hepatomegaly, no splenomegaly +Murphy's sign EXT:  2 plus pulses throughout, no edema, no cyanosis no clubbing SKIN:  No rashes no nodules NEURO:  Cranial nerves II through XII grossly intact, motor grossly intact throughout PSYCH:  Cognitively intact, oriented to person place and time   EKG:  EKG is ordered today. The ekg ordered 06/22/17 demonstrates sinus rhythm. Rate 91 bpm. Frequent PVCs. Prior inferior infarct. Prior lateral infarct. 01/01/18: Sinus rhythm.  First degree heart block.  Non-specific T  wave abnormalities.  09/19/19: Sinus rhythm.  Rate 88 bpm.  First degree AV block.  Nonspecific ST-T changes.   48 hour Holter 10/08/17: Sinus rhythm with frequent PVCs (3%) and brief runs NSVT (3 beats). Appears to have 2 dominant PVC morphologies.   Echo 09/14/17: Study Conclusions  - Left ventricle: The cavity size was normal. Wall thickness was   normal. Systolic function was moderately reduced. The estimated   ejection fraction was in the range of 35% to 40%. Severe   hypokinesis of the inferolateral, inferior, and inferoseptal   myocardium; consistent with infarction in the distribution of the   right coronary and left circumflex coronary artery. Mitral   prosthesis precludes evaluation of LV diastolic function. There   is E-A fusion and pressure half time cannot be measured. - Ventricular septum: Septal motion showed paradox. These changes   are consistent with a post-thoracotomy state. - Mitral valve: A bioprosthesis was present. Transvalvular velocity   was increased more than expected. The findings are consistent   with moderate stenosis. Mean gradient (D): 10 mm Hg. Valve area   by continuity equation (using LVOT flow): 1.18 cm^2. - Left atrium: The atrium was mildly to moderately dilated. - Right ventricle: Systolic function was moderately reduced. - Right atrium: The atrium was mildly dilated.  Impressions:  - Mitral valve bioprosthesis gradients are lower than reported   earlier this year, but remain abnormal and are substantially   higher than the gradients seen on the intraoperative TEE (mean   gradient 4 mm Hg at 90 bpm). Repeat TEE may be helpful to   evaluate for leaflet thrombois.  Echo 02/06/18:  LVEF 35-40%.  Akinesis of inferolateral and inferior myocardium.  Mildly calcified/thickened aortic valve leaflets.  Bioprosthetic mitral valve with mean gradient 15 mmHg.  PASP 34 mmHg.   Echo 05/28/19: LVEF 35-40%.  Mean bioprosthetic mitral valve gradient 8  mmHg.  LHC 04/04/17:  Prox LAD lesion, 75 %stenosed.  Prox LAD to Mid LAD lesion, 65 %stenosed.  Mid LAD to Dist LAD lesion, 55 %stenosed.  Ramus lesion, 90 %stenosed.  Lat Ramus lesion, 50 %stenosed.  Prox Cx lesion, 100 %stenosed.  Prox RCA to Mid  RCA lesion, 95 %stenosed.  There is moderate left ventricular systolic dysfunction.  LV end diastolic pressure is mildly elevated.  The left ventricular ejection fraction is 35-45% by visual estimate.  There is mild (2+) mitral regurgitation.   1. Severe 3 vessel obstructive CAD 2. Moderate LV dysfunction 3. Mildly elevated LV EDP  Recent Labs: 05/28/2019: ALT 24; B Natriuretic Peptide 233.6; BUN 20; Creatinine, Ser 1.32; Hemoglobin 12.8; Platelets 162; Potassium 4.5; Sodium 138; TSH 0.992    Lipid Panel    Component Value Date/Time   CHOL 123 01/01/2018 0919   TRIG 30 01/01/2018 0919   HDL 56 01/01/2018 0919   CHOLHDL 2.2 01/01/2018 0919   CHOLHDL 3.5 04/06/2017 0243   VLDL 13 04/06/2017 0243   LDLCALC 61 01/01/2018 0919      Wt Readings from Last 3 Encounters:  09/19/19 196 lb 6.4 oz (89.1 kg)  08/25/19 197 lb (89.4 kg)  05/28/19 195 lb 3.2 oz (88.5 kg)      ASSESSMENT AND PLAN:  # PVCs: She is struggling with palpitations.  We will try carvedilol 3.125 mg twice daily.  She had PR prolongation with higher doses and on metoprolol.  Repeat EKG at next appointment.  # Chronic systolic and diastolic heart failure:  LVEF 38-46%.  She has been very stable.  Her weight and symptoms are stable.  Continue Entresto, spironolactone, furosemide, and [place].  Add carvedilol as above.   # Bioprosthetic mitral valve:  Stable.  Continue HF meds as above.  Mild-moderate stenosis.  Mean gradient is stable to improved. 8 mmHg on 05/2019.  # Paroxysmal atrial fibrillaton:  Continue warfarin.  Check INR today.  Start carvedilol as above.   # CAD s/p CABG: Her chest pain is not cardiac.  She has discomfort from her surgical  incision that is stable.  Continue aspirin, atorvastatin, and start carvedilol as above.    # DM: OK to add Januvia.  Continue Farxiga and metformin.   Current medicines are reviewed at length with the patient today.  The patient does not have concerns regarding medicines.  The following changes have been made:  Start carvedilol.  Labs/ tests ordered today include:   No orders of the defined types were placed in this encounter.    Disposition:   FU with Antara Brecheisen C. Duke Salvia, MD, [facility] in 1 month.   This note was written with the assistance of speech recognition software.  Please excuse any transcriptional errors.  Signed, Fahim Kats C. Duke Salvia, MD, [facility]  09/19/2019 8:59 AM    [facility]

## 2019-09-19 ENCOUNTER — Ambulatory Visit (INDEPENDENT_AMBULATORY_CARE_PROVIDER_SITE_OTHER): Payer: 59 | Admitting: Pharmacist

## 2019-09-19 ENCOUNTER — Ambulatory Visit (INDEPENDENT_AMBULATORY_CARE_PROVIDER_SITE_OTHER): Payer: 59 | Admitting: Cardiovascular Disease

## 2019-09-19 ENCOUNTER — Other Ambulatory Visit: Payer: Self-pay

## 2019-09-19 ENCOUNTER — Encounter: Payer: Self-pay | Admitting: Cardiovascular Disease

## 2019-09-19 VITALS — BP 124/83 | HR 90 | Temp 97.2°F | Ht 62.5 in | Wt 196.4 lb

## 2019-09-19 DIAGNOSIS — Z7901 Long term (current) use of anticoagulants: Secondary | ICD-10-CM

## 2019-09-19 DIAGNOSIS — I4891 Unspecified atrial fibrillation: Secondary | ICD-10-CM

## 2019-09-19 DIAGNOSIS — Z5181 Encounter for therapeutic drug level monitoring: Secondary | ICD-10-CM | POA: Diagnosis not present

## 2019-09-19 DIAGNOSIS — I5042 Chronic combined systolic (congestive) and diastolic (congestive) heart failure: Secondary | ICD-10-CM

## 2019-09-19 DIAGNOSIS — I480 Paroxysmal atrial fibrillation: Secondary | ICD-10-CM

## 2019-09-19 DIAGNOSIS — Z951 Presence of aortocoronary bypass graft: Secondary | ICD-10-CM | POA: Diagnosis not present

## 2019-09-19 DIAGNOSIS — I10 Essential (primary) hypertension: Secondary | ICD-10-CM

## 2019-09-19 LAB — POCT INR: INR: 1.8 — AB (ref 2.0–3.0)

## 2019-09-19 MED ORDER — CARVEDILOL 3.125 MG PO TABS: 3.1250 mg | ORAL_TABLET | Freq: Two times a day (BID) | ORAL | 3 refills | Status: DC

## 2019-09-19 NOTE — Patient Instructions (Addendum)
Medication Instructions:  START CARVEDILOL 3.125 MG TWICE A DAY   START JANUVIA AS RECOMMENDED   *If you need a refill on your cardiac medications before your next appointment, please call your pharmacy*  Lab Work: FASTING LP/CMET BETWEEN NOW AND YOUR FOLLOW UP  If you have labs (blood work) drawn today and your tests are completely normal, you will receive your results only by: Marland Kitchen MyChart Message (if you have MyChart) OR . A paper copy in the mail If you have any lab test that is abnormal or we need to change your treatment, we will call you to review the results.  Testing/Procedures: NONE  Follow-Up: November 10/23/2019 AT 11:40 AM

## 2019-09-20 ENCOUNTER — Other Ambulatory Visit: Payer: Self-pay | Admitting: Cardiovascular Disease

## 2019-10-10 LAB — LIPID PANEL
Chol/HDL Ratio: 2.7 ratio (ref 0.0–4.4)
Cholesterol, Total: 147 mg/dL (ref 100–199)
HDL: 55 mg/dL (ref >39)
LDL Chol Calc (NIH): 82 mg/dL (ref 0–99)
Triglycerides: 45 mg/dL (ref 0–149)
VLDL Cholesterol Cal: 10 mg/dL (ref 5–40)

## 2019-10-10 LAB — COMPREHENSIVE METABOLIC PANEL
ALT: 11 IU/L (ref 0–32)
AST: 16 IU/L (ref 0–40)
Albumin/Globulin Ratio: 1.4 (ref 1.2–2.2)
Albumin: 4.2 g/dL (ref 3.8–4.8)
Alkaline Phosphatase: 118 IU/L — ABNORMAL HIGH (ref 39–117)
BUN/Creatinine Ratio: 19 (ref 12–28)
BUN: 30 mg/dL — ABNORMAL HIGH (ref 8–27)
Bilirubin Total: 0.2 mg/dL (ref 0.0–1.2)
CO2: 23 mmol/L (ref 20–29)
Calcium: 9.6 mg/dL (ref 8.7–10.3)
Chloride: 102 mmol/L (ref 96–106)
Creatinine, Ser: 1.57 mg/dL — ABNORMAL HIGH (ref 0.57–1.00)
GFR calc Af Amer: 40 mL/min/{1.73_m2} — ABNORMAL LOW (ref >59)
GFR calc non Af Amer: 35 mL/min/{1.73_m2} — ABNORMAL LOW (ref >59)
Globulin, Total: 3 g/dL (ref 1.5–4.5)
Glucose: 71 mg/dL (ref 65–99)
Potassium: 5.6 mmol/L — ABNORMAL HIGH (ref 3.5–5.2)
Sodium: 143 mmol/L (ref 134–144)
Total Protein: 7.2 g/dL (ref 6.0–8.5)

## 2019-10-13 ENCOUNTER — Telehealth: Payer: Self-pay | Admitting: Cardiovascular Disease

## 2019-10-13 DIAGNOSIS — Z5181 Encounter for therapeutic drug level monitoring: Secondary | ICD-10-CM

## 2019-10-13 DIAGNOSIS — E785 Hyperlipidemia, unspecified: Secondary | ICD-10-CM

## 2019-10-13 DIAGNOSIS — I10 Essential (primary) hypertension: Secondary | ICD-10-CM

## 2019-10-13 NOTE — Telephone Encounter (Signed)
Left message to call back  

## 2019-10-13 NOTE — Telephone Encounter (Signed)
New message   Patient is returning your call about labs. Please call.

## 2019-10-14 MED ORDER — ATORVASTATIN CALCIUM 40 MG PO TABS: 40.0000 mg | ORAL_TABLET | Freq: Every day | ORAL | 1 refills | Status: DC

## 2019-10-14 NOTE — Telephone Encounter (Signed)
-----   Message from Skeet Latch, MD sent at 10/10/2019  5:25 PM EST ----- Cholesterol levels are higher than they should be.  LDL needs to be <70.  Can she increase atorvastatin to 40mg ?  Repeat lipids/CMP in 3 months.

## 2019-10-14 NOTE — Telephone Encounter (Signed)
Advised patient of lab results, sent rx to pharmacy and mailed lab order forms

## 2019-10-23 ENCOUNTER — Ambulatory Visit (INDEPENDENT_AMBULATORY_CARE_PROVIDER_SITE_OTHER): Payer: 59 | Admitting: Pharmacist

## 2019-10-23 ENCOUNTER — Ambulatory Visit (INDEPENDENT_AMBULATORY_CARE_PROVIDER_SITE_OTHER): Payer: 59 | Admitting: Cardiovascular Disease

## 2019-10-23 ENCOUNTER — Encounter: Payer: Self-pay | Admitting: Cardiovascular Disease

## 2019-10-23 ENCOUNTER — Other Ambulatory Visit: Payer: Self-pay

## 2019-10-23 VITALS — BP 124/81 | HR 93 | Temp 96.6°F | Ht 62.5 in | Wt 192.6 lb

## 2019-10-23 DIAGNOSIS — Z953 Presence of xenogenic heart valve: Secondary | ICD-10-CM | POA: Diagnosis not present

## 2019-10-23 DIAGNOSIS — I10 Essential (primary) hypertension: Secondary | ICD-10-CM | POA: Diagnosis not present

## 2019-10-23 DIAGNOSIS — I5042 Chronic combined systolic (congestive) and diastolic (congestive) heart failure: Secondary | ICD-10-CM | POA: Diagnosis not present

## 2019-10-23 DIAGNOSIS — Z7901 Long term (current) use of anticoagulants: Secondary | ICD-10-CM | POA: Diagnosis not present

## 2019-10-23 DIAGNOSIS — I4891 Unspecified atrial fibrillation: Secondary | ICD-10-CM

## 2019-10-23 DIAGNOSIS — Z951 Presence of aortocoronary bypass graft: Secondary | ICD-10-CM

## 2019-10-23 LAB — POCT INR: INR: 2.8 (ref 2.0–3.0)

## 2019-10-23 MED ORDER — SPIRONOLACTONE 25 MG PO TABS: ORAL_TABLET | ORAL | 3 refills | Status: DC

## 2019-10-23 NOTE — Progress Notes (Addendum)
Cardiology Office Note   Date:  10/23/2019   ID:  Rachael, Davis [birth year], MRN [id number]  PCP:  Deatra James, Davis  Cardiologist:   Chilton Si, Davis  Heart Failure: Dr. Gala Romney  No chief complaint on file.    History of Present Illness: Rachael Davis is a 63 y.o. 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  Rachael Davis presented 04/2017 with STEMI.  Left heart catheterization revealed 100% left circumflex, 95% proximal RCA, 90% ramus intermedius, and 65% LAD.   LVEF was 35-45%.   Her echo 04/05/17 revealed LVEF 45% with lateral hypokinesis and mild MR.  She underwent four-vessel CABG (LIMA-->LAD, SVG-->RI, SVG-->OM, SVG-->RCA) on 04/16/17. In the OR she was also noted to have moderate to severe mitral regurgitation so she also underwent replacement of the mitral valve with a 25 mm [facility] Ease bioprosthetic valve.  During the hospitalization she required diuresis for acute systolic and diastolic heart failure.  She also had a brief episode of atrial fibrillation that resolved with metoprolol.  She saw Rachael Davis in clinic 05/08/17 and was volume overloaded.  Lasix and metoprolol were increased.  She followed up with Dr. Donata Clay 05/23/17 and was found to be in atrial fibrillation.  She had a repeat echocardiogram 05/24/17 that revealed LVEF 35-40% with akinesis of the inferolateral myocardium and basal inferior myocardium. Her bioprosthetic mitral valve was functioning well. There was a small mobile target on the prosthesis that was thought to be residual native valve.  She was admitted and the heart failure team was consulted.  She spontaneously converted to sinus tachycardia. Metoprolol was discontinued 2/2 low output and she was started on amiodarone.  She underwent L thoracentesis.  Her discharge weight was 177 pounds. She was started on carvedilol  3.125mg  bid.  She wore a 48 hour Holter 10/2017 that showed 3% PVCs.  Rachael Davis had a repeat echo 02/2018 that revealed LVEF 35-40% with a mean gradient across the mitral valve of 15 mmHg.  The gradient decreased to 8 mmHg on subsequent echo in 2020.  She reported palpitations and wore an ambulatory monitor 03/2018 that showed PVCs but no atrial fibrillation.  She last saw Dr. Gala Romney 05/2019 and was stable.  At her last appointment Rachael Davis was started on carvedilol due to frequent PVCs.  In the past she had PR prolongation on higher doses of carvedilol and on metoprolol.  Since her last appointment atorvastatin was increased 10/14/2019.  She continues to have some heart fluttering when she turns in certain directions.  She notices it a couple times per week.  It lasts for less than a minutes. She denies any syncope or pre-syncope.  She noticed some cramping in her L side but never with exertion.  She hasn't been exercising much lately. Overall she has been well.  Her breathing and weight have been stable.  A couple days ago she felt some congestion so she took an extra dose of lasix.  Her breathing feels back to baseline.   Past Medical History:  Diagnosis Date  . Arrhythmia    Hx PAF, PVC's  . CHF (congestive heart failure) (HCC)   . Coronary artery disease    04/04/2017 STEMI  . Diabetes (HCC)   . Hyperlipidemia   . Hypertension   . New onset atrial fibrillation (HCC) 05/2017    Past Surgical History:  Procedure Laterality Date  .  CORONARY ARTERY BYPASS GRAFT N/A 04/12/2017   Procedure: CORONARY ARTERY BYPASS GRAFTING (CABG), ON PUMP, TIMES FOUR, USING LEFT INTERNAL MAMMARY ARTERY AND ENDOSCOPICALLY HARVESTED RIGHT GREATER SAPHENOUS VEIN;  Surgeon: Rachael PernaVan Davis, Peter, Davis;  [facility];  Service: Open Heart Surgery;  Laterality: N/A;  LIMA to LAD; SVG to OM; SVG to Ramus; SVG to Right  . IR THORACENTESIS ASP PLEURAL SPACE W/IMG GUIDE  05/25/2017  . LEFT HEART CATH AND CORONARY ANGIOGRAPHY  N/A 04/04/2017   Procedure: Left Heart Cath and Coronary Angiography;  Surgeon: Davis M SwazilandJordan, Davis;  [facility];  Service: Cardiovascular;  Laterality: N/A;  . MITRAL VALVE REPAIR N/A 04/12/2017   Procedure: MITRAL VALVE REPLACEMENT (MVR);  Surgeon: Rachael Davis, Rachael Davis;  [facility];  Service: Open Heart Surgery;  Laterality: N/A;  Using a 25mm Magna Ease Mitral Pericardial Bioprosthesis  . TEE WITHOUT CARDIOVERSION N/A 04/12/2017   Procedure: TRANSESOPHAGEAL ECHOCARDIOGRAM (TEE);  Surgeon: Rachael Davis, Rachael Davis;  [facility];  Service: Open Heart Surgery;  Laterality: N/A;  . TUBAL LIGATION       Current Outpatient Medications  Medication Sig Dispense Refill  . acetaminophen (TYLENOL) 325 MG tablet Take 2 tablets (650 mg total) by mouth every 6 (six) hours as needed for mild pain or headache.    Rachael Davis. aspirin EC 81 MG tablet Take 81 mg by mouth daily.    Rachael Davis. atorvastatin (LIPITOR) 40 MG tablet Take 1 tablet (40 mg total) by mouth daily at 6 PM. 90 tablet 1  . carvedilol (COREG) 3.125 MG tablet Take 1 tablet (3.125 mg total) by mouth 2 (two) times daily. 180 tablet 3  . dapagliflozin propanediol (FARXIGA) 10 MG TABS tablet Take 10 mg by mouth daily.    . furosemide (LASIX) 40 MG tablet Take 2 tablets (80 mg total) by mouth 3 (three) times a week. On Mon, Wed and Fri 90 tablet 3  . glipiZIDE (GLUCOTROL XL) 10 MG 24 hr tablet Take 1 tablet (10 mg total) by mouth daily with breakfast.    . JANUVIA 100 MG tablet Take 100 mg by mouth daily. Patient has not started yet    . magnesium oxide (MAG-OX) 400 MG tablet TAKE 1 TABLET BY MOUTH EVERY DAY 30 tablet 10  . metFORMIN (GLUCOPHAGE) 1000 MG tablet Take 1,000 mg by mouth 2 (two) times daily with a meal.    . sacubitril-valsartan (ENTRESTO) 97-103 MG Take 1 tablet by mouth 2 (two) times daily. 60 tablet 6  . spironolactone (ALDACTONE) 25 MG tablet TAKE ONE HALF TABLET (12.5 MG TOTAL) BY MOUTH DAILY. 45 tablet 3  . warfarin (COUMADIN)  5 MG tablet TAKE 1 TO 1 AND 1/2 TABLETS BY MOUTH DAILY AS DIRECTED BY THE COUMADIN CLIN 120 tablet 1   No current facility-administered medications for this visit.     Allergies:   Keflex [cephalexin] and Lisinopril    Social History:  The patient  reports that she has never smoked. She has never used smokeless tobacco. She reports that she does not drink alcohol or use drugs.   Family History:  The patient's family history includes Heart attack (age of onset: 6860) in her father.    ROS:  Please see the history of present illness.   Otherwise, review of systems are positive for none.   All other systems are reviewed and negative.    PHYSICAL EXAM: VS:  BP 124/81   Pulse 93   Temp (!) 96.6 F (35.9  C)   Ht 5' 2.5" (1.588 m)   Wt 192 lb 9.6 oz (87.4 kg)   SpO2 99%   BMI 34.67 kg/m  , BMI Body mass index is 34.67 kg/m. GENERAL:  Well appearing HEENT: Pupils equal round and reactive, fundi not visualized, oral mucosa unremarkable NECK:  No jugular venous distention, waveform within normal limits, carotid upstroke brisk and symmetric, no bruits, + thyromegaly LUNGS:  Clear to auscultation bilaterally CHEST: Well-healed midline sternotomy incision HEART:  RRR.  PMI not displaced or sustained,S1 and S2 within normal limits, no S3, no S4, no clicks, no rubs, no murmurs ABD:  Flat, positive bowel sounds normal in frequency in pitch, no bruits, no rebound, no guarding, no midline pulsatile mass, no hepatomegaly, no splenomegaly +Murphy's sign EXT:  2 plus pulses throughout, no edema, no cyanosis no clubbing SKIN:  No rashes no nodules NEURO:  Cranial nerves II through XII grossly intact, motor grossly intact throughout PSYCH:  Cognitively intact, oriented to person place and time   EKG:  EKG is ordered today. The ekg ordered 06/22/17 demonstrates sinus rhythm. Rate 91 bpm. Frequent PVCs. Prior inferior infarct. Prior lateral infarct. 01/01/18: Sinus rhythm.  First degree heart block.   Non-specific T wave abnormalities.  09/19/19: Sinus rhythm.  Rate 88 bpm.  First degree AV block.  Nonspecific ST-T changes.  10/23/19: Sinus rhythm.  Rate 92 bpm.  First dedegree.  Anterolateral T wave inversions.  48 hour Holter 10/08/17: Sinus rhythm with frequent PVCs (3%) and brief runs NSVT (3 beats). Appears to have 2 dominant PVC morphologies.   Echo 09/14/17: Study Conclusions  - Left ventricle: The cavity size was normal. Wall thickness was   normal. Systolic function was moderately reduced. The estimated   ejection fraction was in the range of 35% to 40%. Severe   hypokinesis of the inferolateral, inferior, and inferoseptal   myocardium; consistent with infarction in the distribution of the   right coronary and left circumflex coronary artery. Mitral   prosthesis precludes evaluation of LV diastolic function. There   is E-A fusion and pressure half time cannot be measured. - Ventricular septum: Septal motion showed paradox. These changes   are consistent with a post-thoracotomy state. - Mitral valve: A bioprosthesis was present. Transvalvular velocity   was increased more than expected. The findings are consistent   with moderate stenosis. Mean gradient (D): 10 mm Hg. Valve area   by continuity equation (using LVOT flow): 1.18 cm^2. - Left atrium: The atrium was mildly to moderately dilated. - Right ventricle: Systolic function was moderately reduced. - Right atrium: The atrium was mildly dilated.  Impressions:  - Mitral valve bioprosthesis gradients are lower than reported   earlier this year, but remain abnormal and are substantially   higher than the gradients seen on the intraoperative TEE (mean   gradient 4 mm Hg at 90 bpm). Repeat TEE may be helpful to   evaluate for leaflet thrombois.  Echo 02/06/18:  LVEF 35-40%.  Akinesis of inferolateral and inferior myocardium.  Mildly calcified/thickened aortic valve leaflets.  Bioprosthetic mitral valve with mean gradient  15 mmHg.  PASP 34 mmHg.   Echo 05/28/19: LVEF 35-40%.  Mean bioprosthetic mitral valve gradient 8 mmHg.  LHC 04/04/17:  Prox LAD lesion, 75 %stenosed.  Prox LAD to Mid LAD lesion, 65 %stenosed.  Mid LAD to Dist LAD lesion, 55 %stenosed.  Ramus lesion, 90 %stenosed.  Lat Ramus lesion, 50 %stenosed.  Prox Cx lesion, 100 %stenosed.  Prox RCA to  Mid RCA lesion, 95 %stenosed.  There is moderate left ventricular systolic dysfunction.  LV end diastolic pressure is mildly elevated.  The left ventricular ejection fraction is 35-45% by visual estimate.  There is mild (2+) mitral regurgitation.   1. Severe 3 vessel obstructive CAD 2. Moderate LV dysfunction 3. Mildly elevated LV EDP  Recent Labs: 05/28/2019: B Natriuretic Peptide 233.6; Hemoglobin 12.8; Platelets 162; TSH 0.992 10/10/2019: ALT 11; BUN 30; Creatinine, Ser 1.57; Potassium 5.6; Sodium 143    Lipid Panel    Component Value Date/Time   CHOL 147 10/10/2019 1009   TRIG 45 10/10/2019 1009   HDL 55 10/10/2019 1009   CHOLHDL 2.7 10/10/2019 1009   CHOLHDL 3.5 04/06/2017 0243   VLDL 13 04/06/2017 0243   LDLCALC 82 10/10/2019 1009      Wt Readings from Last 3 Encounters:  10/23/19 192 lb 9.6 oz (87.4 kg)  09/19/19 196 lb 6.4 oz (89.1 kg)  08/25/19 197 lb (89.4 kg)      ASSESSMENT AND PLAN:  # PVCs: No significant change since adding carvedilol.  She is not very bothered by them.  Given her worsening first-degree heart block and higher doses we will continue it carvedilol at the current dose.  # Chronic systolic and diastolic heart failure:  LVEF 35-40%.  She has been very stable.  Her weight and symptoms are stable.  Continue Entresto, spironolactone, furosemide, carvedilol, and [place].    # Bioprosthetic mitral valve:  Stable.  Continue HF meds as above.  Mild-moderate stenosis.  Mean gradient is stable to improved. 8 mmHg on 05/2019.  # Paroxysmal atrial fibrillaton:  Continue carvedilol and warfarin.  Check  INR today.  Currently in sinus rhythm.  # CAD s/p CABG: Her chest pain is not cardiac.  She has discomfort from her surgical incision that is stable.  Continue aspirin, atorvastatin, and carvedilol.    Current medicines are reviewed at length with the patient today.  The patient does not have concerns regarding medicines.  The following changes have been made:  none  Labs/ tests ordered today include:   Orders Placed This Encounter  Procedures  . EKG 12-Lead     Disposition:   FU with Beatrix Breece C. Oval Linsey, Davis, [facility] in 6 months   This note was written with the assistance of speech recognition software.  Please excuse any transcriptional errors.  Signed, Alizaya Oshea C. Oval Linsey, Davis, [facility]  10/23/2019 12:48 PM    [facility]

## 2019-10-23 NOTE — Patient Instructions (Signed)
Medication Instructions:  Your physician recommends that you continue on your current medications as directed. Please refer to the Current Medication list given to you today.  *If you need a refill on your cardiac medications before your next appointment, please call your pharmacy*  Lab Work: NONE If you have labs (blood work) drawn today and your tests are completely normal, you will receive your results only by: . MyChart Message (if you have MyChart) OR . A paper copy in the mail If you have any lab test that is abnormal or we need to change your treatment, we will call you to review the results.  Testing/Procedures: NONE  Follow-Up: At [facility], you and your health needs are our priority.  As part of our continuing mission to provide you with exceptional heart care, we have created designated Provider Care Teams.  These Care Teams include your primary Cardiologist (physician) and Advanced Practice Providers (APPs -  Physician Assistants and Nurse Practitioners) who all work together to provide you with the care you need, when you need it.  Your next appointment:   6 month(s) You will receive a reminder letter in the mail two months in advance. If you don't receive a letter, please call our office to schedule the follow-up appointment.  The format for your next appointment:   Either In Person or Virtual  Provider:   You may see DR Mirrormont  or one of the following Advanced Practice Providers on your designated Care Team:    Luke Kilroy, PA-C  Callie Goodrich, PA-C  Jesse Cleaver, FNP    

## 2019-11-20 ENCOUNTER — Other Ambulatory Visit: Payer: Self-pay | Admitting: Cardiovascular Disease

## 2019-11-25 ENCOUNTER — Encounter (INDEPENDENT_AMBULATORY_CARE_PROVIDER_SITE_OTHER): Payer: Self-pay

## 2019-11-25 ENCOUNTER — Ambulatory Visit (INDEPENDENT_AMBULATORY_CARE_PROVIDER_SITE_OTHER): Payer: Managed Care, Other (non HMO) | Admitting: Pharmacist Clinician (PhC)/ Clinical Pharmacy Specialist

## 2019-11-25 ENCOUNTER — Other Ambulatory Visit: Payer: Self-pay

## 2019-11-25 DIAGNOSIS — Z7901 Long term (current) use of anticoagulants: Secondary | ICD-10-CM

## 2019-11-25 DIAGNOSIS — I4891 Unspecified atrial fibrillation: Secondary | ICD-10-CM | POA: Diagnosis not present

## 2019-11-25 DIAGNOSIS — Z951 Presence of aortocoronary bypass graft: Secondary | ICD-10-CM

## 2019-11-25 LAB — POCT INR: INR: 2.3 (ref 2.0–3.0)

## 2019-12-12 ENCOUNTER — Other Ambulatory Visit: Payer: Self-pay | Admitting: Internal Medicine

## 2020-01-07 ENCOUNTER — Other Ambulatory Visit: Payer: Self-pay

## 2020-01-07 ENCOUNTER — Ambulatory Visit (INDEPENDENT_AMBULATORY_CARE_PROVIDER_SITE_OTHER)
Payer: No Typology Code available for payment source | Admitting: Pharmacist Clinician (PhC)/ Clinical Pharmacy Specialist

## 2020-01-07 DIAGNOSIS — I4891 Unspecified atrial fibrillation: Secondary | ICD-10-CM | POA: Diagnosis not present

## 2020-01-07 DIAGNOSIS — Z7901 Long term (current) use of anticoagulants: Secondary | ICD-10-CM

## 2020-01-07 DIAGNOSIS — Z951 Presence of aortocoronary bypass graft: Secondary | ICD-10-CM

## 2020-01-07 LAB — POCT INR: INR: 1.7 — AB (ref 2.0–3.0)

## 2020-01-07 NOTE — Patient Instructions (Addendum)
Take 1.5 tablets today Wednesday Feb 3, then continue with 1 tablet daily except 1.5 tablets each Friday.  Repeat INR in 4 weeks

## 2020-02-03 ENCOUNTER — Other Ambulatory Visit: Payer: Self-pay | Admitting: Family Medicine

## 2020-02-03 DIAGNOSIS — Z1231 Encounter for screening mammogram for malignant neoplasm of breast: Secondary | ICD-10-CM

## 2020-02-18 ENCOUNTER — Other Ambulatory Visit: Payer: Self-pay

## 2020-02-18 ENCOUNTER — Ambulatory Visit (INDEPENDENT_AMBULATORY_CARE_PROVIDER_SITE_OTHER): Payer: 59 | Admitting: Pharmacist Clinician (PhC)/ Clinical Pharmacy Specialist

## 2020-02-18 DIAGNOSIS — I4891 Unspecified atrial fibrillation: Secondary | ICD-10-CM

## 2020-02-18 DIAGNOSIS — Z951 Presence of aortocoronary bypass graft: Secondary | ICD-10-CM | POA: Diagnosis not present

## 2020-02-18 DIAGNOSIS — Z7901 Long term (current) use of anticoagulants: Secondary | ICD-10-CM

## 2020-02-18 LAB — POCT INR: INR: 2.3 (ref 2.0–3.0)

## 2020-03-10 ENCOUNTER — Other Ambulatory Visit: Payer: Self-pay

## 2020-03-10 ENCOUNTER — Ambulatory Visit
Admission: RE | Admit: 2020-03-10 | Discharge: 2020-03-10 | Disposition: A | Discharge status: Home / Self Care | Payer: 59 | Source: Ambulatory Visit | Attending: Family Medicine | Admitting: Family Medicine

## 2020-03-10 DIAGNOSIS — Z1231 Encounter for screening mammogram for malignant neoplasm of breast: Secondary | ICD-10-CM

## 2020-03-22 ENCOUNTER — Ambulatory Visit (INDEPENDENT_AMBULATORY_CARE_PROVIDER_SITE_OTHER): Payer: 59 | Admitting: Pharmacist

## 2020-03-22 ENCOUNTER — Other Ambulatory Visit: Payer: Self-pay

## 2020-03-22 DIAGNOSIS — I4891 Unspecified atrial fibrillation: Secondary | ICD-10-CM | POA: Diagnosis not present

## 2020-03-22 DIAGNOSIS — Z951 Presence of aortocoronary bypass graft: Secondary | ICD-10-CM | POA: Diagnosis not present

## 2020-03-22 DIAGNOSIS — Z7901 Long term (current) use of anticoagulants: Secondary | ICD-10-CM

## 2020-03-22 LAB — POCT INR: INR: 1.9 — AB (ref 2.0–3.0)

## 2020-04-08 ENCOUNTER — Other Ambulatory Visit: Payer: Self-pay | Admitting: Cardiovascular Disease

## 2020-04-21 ENCOUNTER — Telehealth: Payer: 59 | Admitting: Cardiovascular Disease

## 2020-04-26 ENCOUNTER — Ambulatory Visit (INDEPENDENT_AMBULATORY_CARE_PROVIDER_SITE_OTHER): Payer: 59 | Admitting: Pharmacist

## 2020-04-26 ENCOUNTER — Other Ambulatory Visit: Payer: Self-pay

## 2020-04-26 DIAGNOSIS — Z7901 Long term (current) use of anticoagulants: Secondary | ICD-10-CM | POA: Diagnosis not present

## 2020-04-26 DIAGNOSIS — Z951 Presence of aortocoronary bypass graft: Secondary | ICD-10-CM

## 2020-04-26 DIAGNOSIS — I4891 Unspecified atrial fibrillation: Secondary | ICD-10-CM

## 2020-04-26 LAB — POCT INR: INR: 4.9 — AB (ref 2.0–3.0)

## 2020-04-30 ENCOUNTER — Telehealth: Payer: Self-pay | Admitting: Cardiovascular Disease

## 2020-04-30 ENCOUNTER — Other Ambulatory Visit (HOSPITAL_COMMUNITY): Payer: Self-pay | Admitting: Internal Medicine

## 2020-04-30 ENCOUNTER — Telehealth (HOSPITAL_COMMUNITY): Payer: Self-pay | Admitting: *Deleted

## 2020-04-30 MED ORDER — FUROSEMIDE 80 MG PO TABS: 80.0000 mg | ORAL_TABLET | ORAL | 3 refills | Status: DC

## 2020-04-30 NOTE — Addendum Note (Signed)
Addended by: Regis Bill B on: 04/30/2020 05:19 PM   Modules accepted: Orders

## 2020-04-30 NOTE — Telephone Encounter (Signed)
Confirmed dose with patient Rx sent to [facility] as requested Changed to Lasix 80 mg tablet so she would not have to take 2 tablets

## 2020-04-30 NOTE — Telephone Encounter (Signed)
Pt left VM requesting a refill of lasix. Pt graduated from our clinic June of 2020 and is now followed by [facility]. I called pt and left a detailed message that Dr.Massillon would have to fill her lasix since she is no longer a patient in our clinic.

## 2020-04-30 NOTE — Telephone Encounter (Signed)
*  STAT* If patient is at the pharmacy, call can be transferred to refill team.   1. Which medications need to be refilled? (please list name of each medication and dose if known)  furosemide (LASIX) 40 MG tablet  2. Which pharmacy/location (including street and city if local pharmacy) is medication to be sent to? [id number] [place], [place] - [address]  3. Do they need a 30 day or 90 day supply? 90 day supply  Patient only has 2 tablets lefts.

## 2020-05-13 ENCOUNTER — Ambulatory Visit (INDEPENDENT_AMBULATORY_CARE_PROVIDER_SITE_OTHER): Payer: No Typology Code available for payment source | Admitting: Pharmacist

## 2020-05-13 ENCOUNTER — Other Ambulatory Visit: Payer: Self-pay

## 2020-05-13 DIAGNOSIS — Z7901 Long term (current) use of anticoagulants: Secondary | ICD-10-CM

## 2020-05-13 DIAGNOSIS — Z951 Presence of aortocoronary bypass graft: Secondary | ICD-10-CM

## 2020-05-13 DIAGNOSIS — I4891 Unspecified atrial fibrillation: Secondary | ICD-10-CM

## 2020-05-13 LAB — POCT INR: INR: 2.6 (ref 2.0–3.0)

## 2020-06-02 ENCOUNTER — Encounter: Payer: Self-pay | Admitting: Cardiovascular Disease

## 2020-06-02 ENCOUNTER — Ambulatory Visit (INDEPENDENT_AMBULATORY_CARE_PROVIDER_SITE_OTHER): Payer: No Typology Code available for payment source | Admitting: Pharmacist

## 2020-06-02 ENCOUNTER — Other Ambulatory Visit: Payer: Self-pay

## 2020-06-02 ENCOUNTER — Ambulatory Visit (INDEPENDENT_AMBULATORY_CARE_PROVIDER_SITE_OTHER): Payer: No Typology Code available for payment source | Admitting: Cardiovascular Disease

## 2020-06-02 VITALS — BP 102/74 | HR 73 | Ht 63.0 in | Wt 193.0 lb

## 2020-06-02 DIAGNOSIS — I4891 Unspecified atrial fibrillation: Secondary | ICD-10-CM

## 2020-06-02 DIAGNOSIS — I5043 Acute on chronic combined systolic (congestive) and diastolic (congestive) heart failure: Secondary | ICD-10-CM

## 2020-06-02 DIAGNOSIS — I480 Paroxysmal atrial fibrillation: Secondary | ICD-10-CM

## 2020-06-02 DIAGNOSIS — Z5181 Encounter for therapeutic drug level monitoring: Secondary | ICD-10-CM

## 2020-06-02 DIAGNOSIS — R0989 Other specified symptoms and signs involving the circulatory and respiratory systems: Secondary | ICD-10-CM

## 2020-06-02 DIAGNOSIS — I10 Essential (primary) hypertension: Secondary | ICD-10-CM

## 2020-06-02 DIAGNOSIS — Z7901 Long term (current) use of anticoagulants: Secondary | ICD-10-CM | POA: Diagnosis not present

## 2020-06-02 DIAGNOSIS — M79674 Pain in right toe(s): Secondary | ICD-10-CM | POA: Diagnosis not present

## 2020-06-02 DIAGNOSIS — Z951 Presence of aortocoronary bypass graft: Secondary | ICD-10-CM

## 2020-06-02 LAB — BASIC METABOLIC PANEL
BUN/Creatinine Ratio: 12 (ref 12–28)
BUN: 18 mg/dL (ref 8–27)
CO2: 18 mmol/L — ABNORMAL LOW (ref 20–29)
Calcium: 9.4 mg/dL (ref 8.7–10.3)
Chloride: 106 mmol/L (ref 96–106)
Creatinine, Ser: 1.53 mg/dL — ABNORMAL HIGH (ref 0.57–1.00)
GFR calc Af Amer: 41 mL/min/{1.73_m2} — ABNORMAL LOW (ref >59)
GFR calc non Af Amer: 35 mL/min/{1.73_m2} — ABNORMAL LOW (ref >59)
Glucose: 51 mg/dL — ABNORMAL LOW (ref 65–99)
Potassium: 5.6 mmol/L — ABNORMAL HIGH (ref 3.5–5.2)
Sodium: 142 mmol/L (ref 134–144)

## 2020-06-02 LAB — POCT INR: INR: 2.5 (ref 2.0–3.0)

## 2020-06-02 NOTE — Patient Instructions (Signed)
Medication Instructions:  Your physician recommends that you continue on your current medications as directed. Please refer to the Current Medication list given to you today.  *If you need a refill on your cardiac medications before your next appointment, please call your pharmacy*  Lab Work: BMET TODAY   If you have labs (blood work) drawn today and your tests are completely normal, you will receive your results only by: Marland Kitchen MyChart Message (if you have MyChart) OR . A paper copy in the mail If you have any lab test that is abnormal or we need to change your treatment, we will call you to review the results.  Testing/Procedures: Your physician has requested that you have an ankle brachial index (ABI). During this test an ultrasound and blood pressure cuff are used to evaluate the arteries that supply the arms and legs with blood. Allow thirty minutes for this exam. There are no restrictions or special instructions.  Your physician has requested that you have a lower or upper extremity arterial duplex. This test is an ultrasound of the arteries in the legs or arms. It looks at arterial blood flow in the legs and arms. Allow one hour for Lower and Upper Arterial scans. There are no restrictions or special instructions  Follow-Up: At [facility], you and your health needs are our priority.  As part of our continuing mission to provide you with exceptional heart care, we have created designated Provider Care Teams.  These Care Teams include your primary Cardiologist (physician) and Advanced Practice Providers (APPs -  Physician Assistants and Nurse Practitioners) who all work together to provide you with the care you need, when you need it.  We recommend signing up for the patient portal called "MyChart".  Sign up information is provided on this After Visit Summary.  MyChart is used to connect with patients for Virtual Visits (Telemedicine).  Patients are able to view lab/test results, encounter  notes, upcoming appointments, etc.  Non-urgent messages can be sent to your provider as well.   To learn more about what you can do with MyChart, go to [email or web address].    Your next appointment:   6 month(s)  The format for your next appointment:   In Person  Provider:   You may see DR [facility]  or one of the following Advanced Practice Providers on your designated Care Team:    [name], PA-C  [place], [place]  [name], [place]

## 2020-06-02 NOTE — Progress Notes (Signed)
Cardiology Office Note   Date:  06/02/2020   ID:  [name], Signer [birth year], MRN [id number]  PCP:  Deatra James, MD  Cardiologist:   Chilton Si, MD  Heart Failure: Dr. Gala Romney  No chief complaint on file.    History of Present Illness: Rachael Davis is a 64 y.o. 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  Ms. Rachael Davis presented 04/2017 with STEMI.  Left heart catheterization revealed 100% left circumflex, 95% proximal RCA, 90% ramus intermedius, and 65% LAD.   LVEF was 35-45%.   Her echo 04/05/17 revealed LVEF 45% with lateral hypokinesis and mild MR.  She underwent four-vessel CABG (LIMA-->LAD, SVG-->RI, SVG-->OM, SVG-->RCA) on 04/16/17. In the OR she was also noted to have moderate to severe mitral regurgitation so she also underwent replacement of the mitral valve with a 25 mm [facility] Ease bioprosthetic valve.  During the hospitalization she required diuresis for acute systolic and diastolic heart failure.  She also had a brief episode of atrial fibrillation that resolved with metoprolol.  She saw [name] in clinic 05/08/17 and was volume overloaded.  Lasix and metoprolol were increased.  She followed up with Dr. Donata Clay 05/23/17 and was found to be in atrial fibrillation.  She had a repeat echocardiogram 05/24/17 that revealed LVEF 35-40% with akinesis of the inferolateral myocardium and basal inferior myocardium. Her bioprosthetic mitral valve was functioning well. There was a small mobile target on the prosthesis that was thought to be residual native valve.  She was admitted and the heart failure team was consulted.  She spontaneously converted to sinus tachycardia. Metoprolol was discontinued 2/2 low output and she was started on amiodarone.  She underwent L thoracentesis.  Her discharge weight was 177 pounds. She was started on carvedilol  3.125mg  bid.  She wore a 48 hour Holter 10/2017 that showed 3% PVCs.  Ms. Rachael Davis had a repeat echo 02/2018 that revealed LVEF 35-40% with a mean gradient across the mitral valve of 15 mmHg.  The gradient decreased to 8 mmHg on subsequent echo in 2020.  She reported palpitations and wore an ambulatory monitor 03/2018 that showed PVCs but no atrial fibrillation.  She last saw Dr. Gala Romney 05/2019 and was stable.  She was started on carvedilol due to frequent PVCs.  In the past she had PR prolongation on higher doses of carvedilol and on metoprolol.  Atorvastatin was increased 10/14/2019.  In May she had an episode of shortness of breath.  She came for her coumadin check and mentioned it.  Lasix was increased to 80 mg and her symptoms improved.  She isn't exercising much.  She has no exertional symptoms but can't find time.  She notes some lightheadedness but no syncope.  She denies edema, orthopnea or PND.  She notes numbness in her R great toe at rest but no discomfort with ambulation.    Past Medical History:  Diagnosis Date  . Arrhythmia    Hx PAF, PVC's  . CHF (congestive heart failure) (HCC)   . Coronary artery disease    04/04/2017 STEMI  . Diabetes (HCC)   . Hyperlipidemia   . Hypertension   . New onset atrial fibrillation (HCC) 05/2017    Past Surgical History:  Procedure Laterality Date  . CORONARY ARTERY BYPASS GRAFT N/A 04/12/2017   Procedure: CORONARY ARTERY BYPASS GRAFTING (CABG), ON PUMP, TIMES FOUR, USING LEFT INTERNAL MAMMARY  ARTERY AND ENDOSCOPICALLY HARVESTED RIGHT GREATER SAPHENOUS VEIN;  Surgeon: Kerin Perna, MD;  Location: [facility] OR;  Service: Open Heart Surgery;  Laterality: N/A;  LIMA to LAD; SVG to OM; SVG to Ramus; SVG to Right  . IR THORACENTESIS ASP PLEURAL SPACE W/IMG GUIDE  05/25/2017  . LEFT HEART CATH AND CORONARY ANGIOGRAPHY N/A 04/04/2017   Procedure: Left Heart Cath and Coronary Angiography;  Surgeon: Peter M [name], MD;  Location: [facility] INVASIVE CV LAB;  Service:  Cardiovascular;  Laterality: N/A;  . MITRAL VALVE REPAIR N/A 04/12/2017   Procedure: MITRAL VALVE REPLACEMENT (MVR);  Surgeon: Donata Clay, Theron Arista, MD;  Location: [facility] OR;  Service: Open Heart Surgery;  Laterality: N/A;  Using a 43mm Magna Ease Mitral Pericardial Bioprosthesis  . TEE WITHOUT CARDIOVERSION N/A 04/12/2017   Procedure: TRANSESOPHAGEAL ECHOCARDIOGRAM (TEE);  Surgeon: Donata Clay, Theron Arista, MD;  Location: [facility] OR;  Service: Open Heart Surgery;  Laterality: N/A;  . TUBAL LIGATION       Current Outpatient Medications  Medication Sig Dispense Refill  . acetaminophen (TYLENOL) 325 MG tablet Take 2 tablets (650 mg total) by mouth every 6 (six) hours as needed for mild pain or headache.    Marland Kitchen aspirin EC 81 MG tablet Take 81 mg by mouth daily.    Marland Kitchen atorvastatin (LIPITOR) 40 MG tablet TAKE 1 TABLET (40 MG TOTAL) BY MOUTH DAILY AT 6 PM. 90 tablet 1  . carvedilol (COREG) 3.125 MG tablet Take 1 tablet (3.125 mg total) by mouth 2 (two) times daily. 180 tablet 3  . dapagliflozin propanediol (FARXIGA) 10 MG TABS tablet Take 10 mg by mouth daily.    . furosemide (LASIX) 80 MG tablet Take 1 tablet (80 mg total) by mouth 3 (three) times a week. On Mon, Wed and Fri 40 tablet 3  . glipiZIDE (GLUCOTROL XL) 10 MG 24 hr tablet Take 1 tablet (10 mg total) by mouth daily with breakfast.    . JANUVIA 100 MG tablet Take 100 mg by mouth daily. Patient has not started yet    . magnesium oxide (MAG-OX) 400 MG tablet TAKE 1 TABLET BY MOUTH EVERY DAY 30 tablet 10  . metFORMIN (GLUCOPHAGE) 1000 MG tablet Take 1,000 mg by mouth 2 (two) times daily with a meal.    . sacubitril-valsartan (ENTRESTO) 97-103 MG Take 1 tablet by mouth 2 (two) times daily. 60 tablet 6  . spironolactone (ALDACTONE) 25 MG tablet TAKE ONE HALF TABLET (12.5 MG TOTAL) BY MOUTH DAILY. 45 tablet 3  . warfarin (COUMADIN) 5 MG tablet TAKE 1 TO 1 AND 1/2 TABLETS BY MOUTH DAILY AS DIRECTED BY THE COUMADIN CLIN 120 tablet 1   No current facility-administered  medications for this visit.    Allergies:   Keflex [cephalexin] and Lisinopril    Social History:  The patient  reports that she has never smoked. She has never used smokeless tobacco. She reports that she does not drink alcohol and does not use drugs.   Family History:  The patient's family history includes Breast cancer in her maternal grandmother; Heart attack (age of onset: 28) in her father.    ROS:  Please see the history of present illness.   Otherwise, review of systems are positive for none.   All other systems are reviewed and negative.    PHYSICAL EXAM: VS:  BP 102/74   Pulse 73   Ht 5\' 3"  (1.6 m)   Wt 193 lb (87.5 kg)   SpO2 98%   BMI 34.19  kg/m  , BMI Body mass index is 34.19 kg/m. GENERAL:  Well appearing HEENT: Pupils equal round and reactive, fundi not visualized, oral mucosa unremarkable NECK:  No jugular venous distention, waveform within normal limits, carotid upstroke brisk and symmetric, no bruits, + thyromegaly LUNGS:  Clear to auscultation bilaterally CHEST: Well-healed midline sternotomy incision HEART:  RRR.  PMI not displaced or sustained,S1 and S2 within normal limits, no S3, no S4, no clicks, no rubs, no murmurs ABD:  Flat, positive bowel sounds normal in frequency in pitch, no bruits, no rebound, no guarding, no midline pulsatile mass, no hepatomegaly, no splenomegaly  EXT:  2 plus pulses throughout, no edema, no cyanosis no clubbing SKIN:  No rashes no nodules NEURO:  Cranial nerves II through XII grossly intact, motor grossly intact throughout PSYCH:  Cognitively intact, oriented to person place and time   EKG:  EKG is ordered today. The ekg ordered 06/22/17 demonstrates sinus rhythm. Rate 91 bpm. Frequent PVCs. Prior inferior infarct. Prior lateral infarct. 01/01/18: Sinus rhythm.  First degree heart block.  Non-specific T wave abnormalities.  09/19/19: Sinus rhythm.  Rate 88 bpm.  First degree AV block.  Nonspecific ST-T changes.  10/23/19: Sinus  rhythm.  Rate 92 bpm.  First dedegree.  Anterolateral T wave inversions. 06/02/20: Sinus rhythm.  Mobitz I second degree heart block.  Nonsepecific T wave abnormalities  48 hour Holter 10/08/17: Sinus rhythm with frequent PVCs (3%) and brief runs NSVT (3 beats). Appears to have 2 dominant PVC morphologies.   Echo 09/14/17: Study Conclusions  - Left ventricle: The cavity size was normal. Wall thickness was   normal. Systolic function was moderately reduced. The estimated   ejection fraction was in the range of 35% to 40%. Severe   hypokinesis of the inferolateral, inferior, and inferoseptal   myocardium; consistent with infarction in the distribution of the   right coronary and left circumflex coronary artery. Mitral   prosthesis precludes evaluation of LV diastolic function. There   is E-A fusion and pressure half time cannot be measured. - Ventricular septum: Septal motion showed paradox. These changes   are consistent with a post-thoracotomy state. - Mitral valve: A bioprosthesis was present. Transvalvular velocity   was increased more than expected. The findings are consistent   with moderate stenosis. Mean gradient (D): 10 mm Hg. Valve area   by continuity equation (using LVOT flow): 1.18 cm^2. - Left atrium: The atrium was mildly to moderately dilated. - Right ventricle: Systolic function was moderately reduced. - Right atrium: The atrium was mildly dilated.  Impressions:  - Mitral valve bioprosthesis gradients are lower than reported   earlier this year, but remain abnormal and are substantially   higher than the gradients seen on the intraoperative TEE (mean   gradient 4 mm Hg at 90 bpm). Repeat TEE may be helpful to   evaluate for leaflet thrombois.  Echo 02/06/18:  LVEF 35-40%.  Akinesis of inferolateral and inferior myocardium.  Mildly calcified/thickened aortic valve leaflets.  Bioprosthetic mitral valve with mean gradient 15 mmHg.  PASP 34 mmHg.   Echo 05/28/19: LVEF  35-40%.  Mean bioprosthetic mitral valve gradient 8 mmHg.  LHC 04/04/17:  Prox LAD lesion, 75 %stenosed.  Prox LAD to Mid LAD lesion, 65 %stenosed.  Mid LAD to Dist LAD lesion, 55 %stenosed.  Ramus lesion, 90 %stenosed.  Lat Ramus lesion, 50 %stenosed.  Prox Cx lesion, 100 %stenosed.  Prox RCA to Mid RCA lesion, 95 %stenosed.  There is moderate left ventricular  systolic dysfunction.  LV end diastolic pressure is mildly elevated.  The left ventricular ejection fraction is 35-45% by visual estimate.  There is mild (2+) mitral regurgitation.   1. Severe 3 vessel obstructive CAD 2. Moderate LV dysfunction 3. Mildly elevated LV EDP  Recent Labs: 10/10/2019: ALT 11; BUN 30; Creatinine, Ser 1.57; Potassium 5.6; Sodium 143    Lipid Panel    Component Value Date/Time   CHOL 147 10/10/2019 1009   TRIG 45 10/10/2019 1009   HDL 55 10/10/2019 1009   CHOLHDL 2.7 10/10/2019 1009   CHOLHDL 3.5 04/06/2017 0243   VLDL 13 04/06/2017 0243   LDLCALC 82 10/10/2019 1009      Wt Readings from Last 3 Encounters:  06/02/20 193 lb (87.5 kg)  10/23/19 192 lb 9.6 oz (87.4 kg)  09/19/19 196 lb 6.4 oz (89.1 kg)      ASSESSMENT AND PLAN:  # PVCs: None on EKG today.  She is asympotmatic.   # Chronic systolic and diastolic heart failure:  LVEF 16-10%.  She is euvolemic.  Symptoms improved since increasing lasix.  Check BMP.    Continue Entresto, spironolactone, furosemide, carvedilol, and [place].  BP low but she is asympotmatic.   # Bioprosthetic mitral valve:  Stable.  Continue HF meds as above.  Mild-moderate stenosis.  Mean gradient is stable to improved. 8 mmHg on 05/2019.  # Paroxysmal atrial fibrillaton:  Continue carvedilol and warfarin.  Currently in sinus rhythm with Mobitz I.  # CAD s/p CABG: Her chest pain is not cardiac.  She has discomfort from her surgical incision that is stable.  Continue aspirin, atorvastatin, and carvedilol.    Current medicines are reviewed at  length with the patient today.  The patient does not have concerns regarding medicines.  The following changes have been made:  none  Labs/ tests ordered today include:   Orders Placed This Encounter  Procedures  . Basic metabolic panel  . EKG 12-Lead  . VAS [place] ABI WITH/WO TBI  . VAS [place] LOWER EXTREMITY ARTERIAL DUPLEX     Disposition:   FU with Vessie Olmsted C. Duke Salvia, MD, [facility] in 6 months   This note was written with the assistance of speech recognition software.  Please excuse any transcriptional errors.  Signed, Luigi Stuckey C. Duke Salvia, MD, [facility]  06/02/2020 10:45 AM    [facility]

## 2020-06-02 NOTE — Patient Instructions (Signed)
Continue with 1 tablet daily except 1.5 tablets each Friday.  Repeat INR in 6  weeks   

## 2020-06-04 ENCOUNTER — Other Ambulatory Visit: Payer: Self-pay | Admitting: Cardiovascular Disease

## 2020-06-04 ENCOUNTER — Telehealth: Payer: Self-pay | Admitting: *Deleted

## 2020-06-04 DIAGNOSIS — Z7901 Long term (current) use of anticoagulants: Secondary | ICD-10-CM

## 2020-06-04 NOTE — Telephone Encounter (Signed)
-----   Message from Chilton Si, MD sent at 06/04/2020 10:36 AM EDT ----- Kidney function abnormal but very stable.  Potassium is still running a little high.  Stop spironolactone.

## 2020-06-04 NOTE — Telephone Encounter (Signed)
Advised patient of lab results and medication change, verbalized understanding  

## 2020-06-25 ENCOUNTER — Encounter (HOSPITAL_COMMUNITY): Payer: No Typology Code available for payment source

## 2020-06-28 ENCOUNTER — Telehealth: Payer: Self-pay | Admitting: Cardiovascular Disease

## 2020-06-28 ENCOUNTER — Other Ambulatory Visit: Payer: Self-pay | Admitting: Cardiovascular Disease

## 2020-06-28 MED ORDER — SACUBITRIL-VALSARTAN 97-103 MG PO TABS: 1.0000 | ORAL_TABLET | Freq: Two times a day (BID) | ORAL | 6 refills | Status: DC

## 2020-06-28 NOTE — Telephone Encounter (Signed)
*  STAT* If patient is at the pharmacy, call can be transferred to refill team.   1. Which medications need to be refilled? (please list name of each medication and dose if known) sacubitril-valsartan (ENTRESTO) 97-103 MG  2. Which pharmacy/location (including street and city if local pharmacy) is medication to be sent to? She is trying to go back through her patient assistance program   3. Do they need a 30 day or 90 day supply? 30 day Patient doesn't have any right now

## 2020-06-28 NOTE — Telephone Encounter (Signed)
Follow Up:   Pt said her [name] was sent to the wrong pharmacy. It needs to be sent to [employer] by e-script please.

## 2020-06-29 ENCOUNTER — Other Ambulatory Visit: Payer: Self-pay

## 2020-06-29 MED ORDER — SACUBITRIL-VALSARTAN 97-103 MG PO TABS: 1.0000 | ORAL_TABLET | Freq: Two times a day (BID) | ORAL | 6 refills | Status: DC

## 2020-07-08 ENCOUNTER — Other Ambulatory Visit (HOSPITAL_COMMUNITY): Payer: Self-pay | Admitting: Surgery

## 2020-07-08 MED ORDER — SACUBITRIL-VALSARTAN 97-103 MG PO TABS: 1.0000 | ORAL_TABLET | Freq: Two times a day (BID) | ORAL | 6 refills | Status: DC

## 2020-07-09 ENCOUNTER — Telehealth: Payer: Self-pay | Admitting: Cardiovascular Disease

## 2020-07-09 NOTE — Telephone Encounter (Signed)
Pt c/o medication issue:  1. Name of Medication: sacubitril-valsartan (ENTRESTO) 97-103 MG  2. How are you currently taking this medication (dosage and times per day)? As directed  3. Are you having a reaction (difficulty breathing--STAT)? No  4. What is your medication issue? Patient states he is unable to afford medication. She would like a call back to discuss whether or not she may be eligible for assistance with purchasing or an alternative. Please call.

## 2020-07-09 NOTE — Telephone Encounter (Signed)
Spoke to pt who voiced she is unable to afford Entresto. Nurse provided pt with [name] co-pay offer phone number. Pt state she will try to enroll for that first and call back if she has any issue.

## 2020-07-14 ENCOUNTER — Telehealth: Payer: Self-pay

## 2020-07-14 NOTE — Telephone Encounter (Signed)
lmom overdue inr °

## 2020-07-15 ENCOUNTER — Telehealth (HOSPITAL_COMMUNITY): Payer: Self-pay | Admitting: Pharmacy Technician

## 2020-07-15 NOTE — Telephone Encounter (Signed)
It's time to re-enroll patient to receive medication assistance for Entresto from [employer]. Left voicemail for patient to call me back so that we can start the re-enrollment process.  Will follow up.    

## 2020-07-15 NOTE — Telephone Encounter (Signed)
Spoke with the patient today. I am going to mail her the [employer] application to take to the [facility] office since she is no longer under Dr. Prescott Gum care. Patient was appreciative.   Archer Asa, CPhT

## 2020-07-21 ENCOUNTER — Other Ambulatory Visit: Payer: Self-pay

## 2020-07-21 ENCOUNTER — Ambulatory Visit (INDEPENDENT_AMBULATORY_CARE_PROVIDER_SITE_OTHER): Payer: No Typology Code available for payment source

## 2020-07-21 ENCOUNTER — Ambulatory Visit (HOSPITAL_COMMUNITY)
Admission: RE | Admit: 2020-07-21 | Discharge: 2020-07-21 | Disposition: A | Discharge status: Home / Self Care | Payer: No Typology Code available for payment source | Source: Ambulatory Visit | Attending: Cardiology | Admitting: Cardiology

## 2020-07-21 DIAGNOSIS — M79674 Pain in right toe(s): Secondary | ICD-10-CM | POA: Insufficient documentation

## 2020-07-21 DIAGNOSIS — R0989 Other specified symptoms and signs involving the circulatory and respiratory systems: Secondary | ICD-10-CM | POA: Insufficient documentation

## 2020-07-21 DIAGNOSIS — Z951 Presence of aortocoronary bypass graft: Secondary | ICD-10-CM

## 2020-07-21 DIAGNOSIS — Z7901 Long term (current) use of anticoagulants: Secondary | ICD-10-CM | POA: Diagnosis not present

## 2020-07-21 LAB — POCT INR: INR: 2 (ref 2.0–3.0)

## 2020-07-21 NOTE — Patient Instructions (Signed)
Continue with 1 tablet daily except 1.5 tablets each Friday.  Repeat INR in 6  weeks   

## 2020-07-30 ENCOUNTER — Telehealth: Payer: Self-pay | Admitting: Cardiovascular Disease

## 2020-07-30 NOTE — Telephone Encounter (Signed)
    I went in pt chart to see if medicine had been refilled

## 2020-08-24 ENCOUNTER — Ambulatory Visit: Payer: Managed Care, Other (non HMO) | Admitting: Internal Medicine

## 2020-09-01 ENCOUNTER — Other Ambulatory Visit: Payer: Self-pay

## 2020-09-01 ENCOUNTER — Ambulatory Visit (INDEPENDENT_AMBULATORY_CARE_PROVIDER_SITE_OTHER): Payer: No Typology Code available for payment source

## 2020-09-01 DIAGNOSIS — Z951 Presence of aortocoronary bypass graft: Secondary | ICD-10-CM

## 2020-09-01 DIAGNOSIS — Z7901 Long term (current) use of anticoagulants: Secondary | ICD-10-CM | POA: Diagnosis not present

## 2020-09-01 LAB — POCT INR: INR: 2.8 (ref 2.0–3.0)

## 2020-09-01 NOTE — Patient Instructions (Addendum)
Continue with 1 tablet daily except 1.5 tablets each Friday.  Repeat INR in 5 weeks

## 2020-09-10 ENCOUNTER — Other Ambulatory Visit: Payer: Self-pay | Admitting: Cardiovascular Disease

## 2020-09-15 ENCOUNTER — Ambulatory Visit: Payer: Self-pay | Admitting: Internal Medicine

## 2020-10-01 ENCOUNTER — Other Ambulatory Visit: Payer: Self-pay | Admitting: Cardiovascular Disease

## 2020-10-06 ENCOUNTER — Ambulatory Visit (INDEPENDENT_AMBULATORY_CARE_PROVIDER_SITE_OTHER): Payer: No Typology Code available for payment source

## 2020-10-06 ENCOUNTER — Other Ambulatory Visit: Payer: Self-pay

## 2020-10-06 DIAGNOSIS — Z7901 Long term (current) use of anticoagulants: Secondary | ICD-10-CM

## 2020-10-06 DIAGNOSIS — Z951 Presence of aortocoronary bypass graft: Secondary | ICD-10-CM

## 2020-10-06 LAB — POCT INR: INR: 3 (ref 2.0–3.0)

## 2020-10-06 NOTE — Patient Instructions (Signed)
Continue with 1 tablet daily except 1.5 tablets each Friday.  Repeat INR in 6  weeks   

## 2020-11-05 ENCOUNTER — Other Ambulatory Visit: Payer: Self-pay | Admitting: Cardiovascular Disease

## 2020-11-19 ENCOUNTER — Other Ambulatory Visit: Payer: Self-pay

## 2020-11-19 ENCOUNTER — Ambulatory Visit (INDEPENDENT_AMBULATORY_CARE_PROVIDER_SITE_OTHER): Payer: No Typology Code available for payment source

## 2020-11-19 DIAGNOSIS — Z951 Presence of aortocoronary bypass graft: Secondary | ICD-10-CM

## 2020-11-19 DIAGNOSIS — Z7901 Long term (current) use of anticoagulants: Secondary | ICD-10-CM

## 2020-11-19 LAB — POCT INR: INR: 3.3 — AB (ref 2.0–3.0)

## 2020-11-19 NOTE — Patient Instructions (Signed)
Take 0.5 tablets today only and then Continue with 1 tablet daily except 1.5 tablets each Friday.  Repeat INR in 6  weeks

## 2020-11-23 ENCOUNTER — Ambulatory Visit: Payer: No Typology Code available for payment source | Admitting: Cardiovascular Disease

## 2020-11-30 ENCOUNTER — Other Ambulatory Visit: Payer: Self-pay | Admitting: Cardiovascular Disease

## 2020-11-30 DIAGNOSIS — Z7901 Long term (current) use of anticoagulants: Secondary | ICD-10-CM

## 2020-12-09 ENCOUNTER — Encounter: Payer: Self-pay | Admitting: Cardiovascular Disease

## 2020-12-09 ENCOUNTER — Ambulatory Visit (INDEPENDENT_AMBULATORY_CARE_PROVIDER_SITE_OTHER): Payer: No Typology Code available for payment source | Admitting: Cardiovascular Disease

## 2020-12-09 ENCOUNTER — Other Ambulatory Visit: Payer: Self-pay

## 2020-12-09 VITALS — BP 116/70 | HR 82 | Ht 64.0 in | Wt 185.6 lb

## 2020-12-09 DIAGNOSIS — I5042 Chronic combined systolic (congestive) and diastolic (congestive) heart failure: Secondary | ICD-10-CM

## 2020-12-09 DIAGNOSIS — Z951 Presence of aortocoronary bypass graft: Secondary | ICD-10-CM

## 2020-12-09 DIAGNOSIS — Z953 Presence of xenogenic heart valve: Secondary | ICD-10-CM

## 2020-12-09 DIAGNOSIS — E785 Hyperlipidemia, unspecified: Secondary | ICD-10-CM

## 2020-12-09 DIAGNOSIS — I2121 ST elevation (STEMI) myocardial infarction involving left circumflex coronary artery: Secondary | ICD-10-CM

## 2020-12-09 DIAGNOSIS — Z5181 Encounter for therapeutic drug level monitoring: Secondary | ICD-10-CM | POA: Diagnosis not present

## 2020-12-09 DIAGNOSIS — I10 Essential (primary) hypertension: Secondary | ICD-10-CM | POA: Diagnosis not present

## 2020-12-09 DIAGNOSIS — E1121 Type 2 diabetes mellitus with diabetic nephropathy: Secondary | ICD-10-CM

## 2020-12-09 DIAGNOSIS — I4891 Unspecified atrial fibrillation: Secondary | ICD-10-CM

## 2020-12-09 NOTE — Progress Notes (Signed)
Cardiology Office Note   Date:  12/09/2020   ID:  [name], [name] January 03, 1956, MRN [id number]  PCP:  Deatra James, MD  Cardiologist:   Chilton Si, MD  Heart Failure: Dr. Gala Romney  No chief complaint on file.    History of Present Illness: Rachael Davis is a 65 y.o. 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  Rachael Davis presented 04/2017 with STEMI.  Left heart catheterization revealed 100% left circumflex, 95% proximal RCA, 90% ramus intermedius, and 65% LAD.   LVEF was 35-45%.   Her echo 04/05/17 revealed LVEF 45% with lateral hypokinesis and mild MR.  She underwent four-vessel CABG (LIMA-->LAD, SVG-->RI, SVG-->OM, SVG-->RCA) on 04/16/17. In the OR she was also noted to have moderate to severe mitral regurgitation so she also underwent replacement of the mitral valve with a 25 mm [facility] Ease bioprosthetic valve.  During the hospitalization she required diuresis for acute systolic and diastolic heart failure.  She also had a brief episode of atrial fibrillation that resolved with metoprolol.  She saw [name] in clinic 05/08/17 and was volume overloaded.  Lasix and metoprolol were increased.  She followed up with Dr. Donata Clay 05/23/17 and was found to be in atrial fibrillation.  She had a repeat echocardiogram 05/24/17 that revealed LVEF 35-40% with akinesis of the inferolateral myocardium and basal inferior myocardium. Her bioprosthetic mitral valve was functioning well. There was a small mobile target on the prosthesis that was thought to be residual native valve.  She was admitted and the heart failure team was consulted.  She spontaneously converted to sinus tachycardia. Metoprolol was discontinued 2/2 low output and she was started on amiodarone.  She underwent L thoracentesis.  Her discharge weight  was 177 pounds. She was started on carvedilol 3.125mg  bid.  She wore a 48 hour Holter 10/2017 that showed 3% PVCs.  Rachael Davis had a repeat echo 02/2018 that revealed LVEF 35-40% with a mean gradient across the mitral valve of 15 mmHg.  The gradient decreased to 8 mmHg on subsequent echo in 2020.  She reported palpitations and wore an ambulatory monitor 03/2018 that showed PVCs but no atrial fibrillation.  She last saw Dr. Gala Romney 05/2019 and was stable.  She was started on carvedilol due to frequent PVCs.  In the past she had PR prolongation on higher doses of carvedilol and on metoprolol.  Atorvastatin was increased 10/14/2019.  In May 2021 she had an episode of shortness of breath.  She came for her coumadin check and mentioned it.  Lasix was increased to 80 mg and her symptoms improved.  At her last appointment Rachael Davis was doing well.  She called our office with concerns about affording Entresto.  Lately she has been feeling well.  She had a cold 6 weeks ago.  She coughed so back that her sides were hurting.  The left has subsided but the right is still sore.  She has been walking for exercising.  She feels good with walking and has no chest pain.  She sometimes gets a pinching sensation in her chest when at rest.  She has no edema now but did have some swelling in her L leg one day last week.  She has numbness in her toes on the R.  It bothers her most when laying in bed at night. She doesn't check  her BP at home.  She denies LH or dizziness.  She has been working on her diet and losing weight.    Past Medical History:  Diagnosis Date  . Arrhythmia    Hx PAF, PVC's  . CHF (congestive heart failure) ([place])   . Coronary artery disease    04/04/2017 STEMI  . Diabetes ([place])   . Hyperlipidemia   . Hypertension   . New onset atrial fibrillation ([place]) 05/2017    Past Surgical History:  Procedure Laterality Date  . CORONARY ARTERY BYPASS GRAFT N/A 04/12/2017   Procedure: CORONARY ARTERY BYPASS  GRAFTING (CABG), ON PUMP, TIMES FOUR, USING LEFT INTERNAL MAMMARY ARTERY AND ENDOSCOPICALLY HARVESTED RIGHT GREATER SAPHENOUS VEIN;  Surgeon: Ivin Poot, MD;  Location: [place];  Service: Open Heart Surgery;  Laterality: N/A;  LIMA to LAD; SVG to OM; SVG to Ramus; SVG to Right  . IR THORACENTESIS ASP PLEURAL SPACE W/IMG GUIDE  05/25/2017  . LEFT HEART CATH AND CORONARY ANGIOGRAPHY N/A 04/04/2017   Procedure: Left Heart Cath and Coronary Angiography;  Surgeon: Peter M Martinique, MD;  Location: [facility];  Service: Cardiovascular;  Laterality: N/A;  . MITRAL VALVE REPAIR N/A 04/12/2017   Procedure: MITRAL VALVE REPLACEMENT (MVR);  Surgeon: Prescott Gum, Collier Salina, MD;  Location: [place];  Service: Open Heart Surgery;  Laterality: N/A;  Using a 93mm Magna Ease Mitral Pericardial Bioprosthesis  . TEE WITHOUT CARDIOVERSION N/A 04/12/2017   Procedure: TRANSESOPHAGEAL ECHOCARDIOGRAM (TEE);  Surgeon: Prescott Gum, Collier Salina, MD;  Location: [place];  Service: Open Heart Surgery;  Laterality: N/A;  . TUBAL LIGATION       Current Outpatient Medications  Medication Sig Dispense Refill  . acetaminophen (TYLENOL) 325 MG tablet Take 2 tablets (650 mg total) by mouth every 6 (six) hours as needed for mild pain or headache.    Marland Kitchen aspirin EC 81 MG tablet Take 81 mg by mouth daily.    Marland Kitchen atorvastatin (LIPITOR) 40 MG tablet TAKE 1 TABLET (40 MG TOTAL) BY MOUTH DAILY AT 6 PM. 90 tablet 3  . carvedilol (COREG) 3.125 MG tablet Take 1 tablet (3.125 mg total) by mouth 2 (two) times daily. 180 tablet 2  . dapagliflozin propanediol (FARXIGA) 10 MG TABS tablet Take 10 mg by mouth daily.    . furosemide (LASIX) 80 MG tablet TAKE 1 TABLET (80 MG TOTAL) BY MOUTH 3 (THREE) TIMES A WEEK. ON MON, WED AND FRI. 60 tablet 3  . glipiZIDE (GLUCOTROL XL) 10 MG 24 hr tablet Take 1 tablet (10 mg total) by mouth daily with breakfast.    . JANUVIA 100 MG tablet Take 100 mg by mouth daily. Patient has not started yet    . magnesium oxide (MAG-OX) 400 MG  tablet TAKE 1 TABLET BY MOUTH EVERY DAY 90 tablet 3  . metFORMIN (GLUCOPHAGE) 1000 MG tablet Take 1,000 mg by mouth 2 (two) times daily with a meal.    . sacubitril-valsartan (ENTRESTO) 97-103 MG Take 1 tablet by mouth 2 (two) times daily. 60 tablet 6  . warfarin (COUMADIN) 5 MG tablet TAKE 1 TO 1 AND 1/2 TABLETS BY MOUTH DAILY AS DIRECTED BY THE COUMADIN CLIN 120 tablet 1   No current facility-administered medications for this visit.    Allergies:   Keflex [cephalexin] and Lisinopril    Social History:  The patient  reports that she has never smoked. She has never used smokeless tobacco. She reports that she does not drink alcohol and does not use  drugs.   Family History:  The patient's family history includes Breast cancer in her maternal grandmother; Heart attack (age of onset: 30) in her father.    ROS:  Please see the history of present illness.   Otherwise, review of systems are positive for none.   All other systems are reviewed and negative.    PHYSICAL EXAM: VS:  BP 116/70   Pulse 82   Ht 5\' 4"  (1.626 m)   Wt 185 lb 9.6 oz (84.2 kg)   BMI 31.86 kg/m  , BMI Body mass index is 31.86 kg/m. GENERAL:  Well appearing HEENT: Pupils equal round and reactive, fundi not visualized, oral mucosa unremarkable NECK:  No jugular venous distention, waveform within normal limits, carotid upstroke brisk and symmetric, no bruits, + thyromegaly LUNGS:  Clear to auscultation bilaterally CHEST: Well-healed midline sternotomy incision HEART:  RRR.  PMI not displaced or sustained,S1 and S2 within normal limits, no S3, no S4, no clicks, no rubs, no murmurs ABD:  Flat, positive bowel sounds normal in frequency in pitch, no bruits, no rebound, no guarding, no midline pulsatile mass, no hepatomegaly, no splenomegaly  EXT:  2 plus pulses throughout, no edema, no cyanosis no clubbing SKIN:  No rashes no nodules NEURO:  Cranial nerves II through XII grossly intact, motor grossly intact  throughout PSYCH:  Cognitively intact, oriented to person place and time   EKG:  EKG is ordered today. The ekg ordered 06/22/17 demonstrates sinus rhythm. Rate 91 bpm. Frequent PVCs. Prior inferior infarct. Prior lateral infarct. 01/01/18: Sinus rhythm.  First degree heart block.  Non-specific T wave abnormalities.  09/19/19: Sinus rhythm.  Rate 88 bpm.  First degree AV block.  Nonspecific ST-T changes.  10/23/19: Sinus rhythm.  Rate 92 bpm.  First dedegree.  Anterolateral T wave inversions. 06/02/20: Sinus rhythm.  Mobitz I second degree heart block.  Nonsepecific T wave abnormalities 12/09/2020: Sinus rhythm.  First-degree AV block.  Mobitz 1 second-degree heart block.  Prior inferior MI.  Prior anterolateral MI.  48 hour Holter 10/08/17: Sinus rhythm with frequent PVCs (3%) and brief runs NSVT (3 beats). Appears to have 2 dominant PVC morphologies.   Echo 09/14/17: Study Conclusions  - Left ventricle: The cavity size was normal. Wall thickness was   normal. Systolic function was moderately reduced. The estimated   ejection fraction was in the range of 35% to 40%. Severe   hypokinesis of the inferolateral, inferior, and inferoseptal   myocardium; consistent with infarction in the distribution of the   right coronary and left circumflex coronary artery. Mitral   prosthesis precludes evaluation of LV diastolic function. There   is E-A fusion and pressure half time cannot be measured. - Ventricular septum: Septal motion showed paradox. These changes   are consistent with a post-thoracotomy state. - Mitral valve: A bioprosthesis was present. Transvalvular velocity   was increased more than expected. The findings are consistent   with moderate stenosis. Mean gradient (D): 10 mm Hg. Valve area   by continuity equation (using LVOT flow): 1.18 cm^2. - Left atrium: The atrium was mildly to moderately dilated. - Right ventricle: Systolic function was moderately reduced. - Right atrium: The  atrium was mildly dilated.  Impressions:  - Mitral valve bioprosthesis gradients are lower than reported   earlier this year, but remain abnormal and are substantially   higher than the gradients seen on the intraoperative TEE (mean   gradient 4 mm Hg at 90 bpm). Repeat TEE may be helpful to  evaluate for leaflet thrombois.  Echo 02/06/18:  LVEF 35-40%.  Akinesis of inferolateral and inferior myocardium.  Mildly calcified/thickened aortic valve leaflets.  Bioprosthetic mitral valve with mean gradient 15 mmHg.  PASP 34 mmHg.   Echo 05/28/19: LVEF 35-40%.  Mean bioprosthetic mitral valve gradient 8 mmHg.  LHC 04/04/17:  Prox LAD lesion, 75 %stenosed.  Prox LAD to Mid LAD lesion, 65 %stenosed.  Mid LAD to Dist LAD lesion, 55 %stenosed.  Ramus lesion, 90 %stenosed.  Lat Ramus lesion, 50 %stenosed.  Prox Cx lesion, 100 %stenosed.  Prox RCA to Mid RCA lesion, 95 %stenosed.  There is moderate left ventricular systolic dysfunction.  LV end diastolic pressure is mildly elevated.  The left ventricular ejection fraction is 35-45% by visual estimate.  There is mild (2+) mitral regurgitation.   1. Severe 3 vessel obstructive CAD 2. Moderate LV dysfunction 3. Mildly elevated LV EDP  Recent Labs: 06/02/2020: BUN 18; Creatinine, Ser 1.53; Potassium 5.6; Sodium 142    Lipid Panel    Component Value Date/Time   CHOL 147 10/10/2019 1009   TRIG 45 10/10/2019 1009   HDL 55 10/10/2019 1009   CHOLHDL 2.7 10/10/2019 1009   CHOLHDL 3.5 04/06/2017 0243   VLDL 13 04/06/2017 0243   LDLCALC 82 10/10/2019 1009      Wt Readings from Last 3 Encounters:  12/09/20 185 lb 9.6 oz (84.2 kg)  06/02/20 193 lb (87.5 kg)  10/23/19 192 lb 9.6 oz (87.4 kg)      ASSESSMENT AND PLAN:  # PVCs: None on EKG today.  She is asympotmatic.  Continue carvedilol.  # Chronic systolic and diastolic heart failure:  LVEF 52-84%.  She is euvolemic.  Symptoms improved since increasing lasix.  Check CMP.   Continue carvedilol, Farxiga, Lasix, and Entresto.  # Bioprosthetic mitral valve:  Stable.  Continue HF meds as above.  Mild-moderate stenosis.  Mean gradient is stable to improved. 8 mmHg on 05/2019.  Repeat echocardiogram.  # Paroxysmal atrial fibrillaton:  Continue carvedilol and warfarin.  Currently in sinus rhythm with Mobitz I.  # CAD s/p CABG:  # Hyperlipidemia:  She has atypical chest pain that is not cardiac.  She has no exertional symptoms.  Continue aspirin, atorvastatin, and carvedilol.  Check fasting lipids today.    Current medicines are reviewed at length with the patient today.  The patient does not have concerns regarding medicines.  The following changes have been made:  none  Labs/ tests ordered today include:   Orders Placed This Encounter  Procedures  . Lipid panel  . Comprehensive metabolic panel  . EKG 12-Lead  . ECHOCARDIOGRAM COMPLETE     Disposition:   FU with Keithon Mccoin C. Duke Salvia, MD, [facility] in 6 months   This note was written with the assistance of speech recognition software.  Please excuse any transcriptional errors.  Signed, Pericles Carmicheal C. Duke Salvia, MD, [facility]  12/09/2020 12:57 PM     [facility]

## 2020-12-09 NOTE — Patient Instructions (Signed)
Medication Instructions:  Your physician recommends that you continue on your current medications as directed. Please refer to the Current Medication list given to you today.  *If you need a refill on your cardiac medications before your next appointment, please call your pharmacy*  Lab Work: LP/CMET TODAY   If you have labs (blood work) drawn today and your tests are completely normal, you will receive your results only by: Marland Kitchen MyChart Message (if you have MyChart) OR . A paper copy in the mail If you have any lab test that is abnormal or we need to change your treatment, we will call you to review the results.  Testing/Procedures: Your physician has requested that you have an echocardiogram. Echocardiography is a painless test that uses sound waves to create images of your heart. It provides your doctor with information about the size and shape of your heart and how well your heart's chambers and valves are working. This procedure takes approximately one hour. There are no restrictions for this procedure. [facility] AT [address]  Follow-Up: At [facility], you and your health needs are our priority.  As part of our continuing mission to provide you with exceptional heart care, we have created designated Provider Care Teams.  These Care Teams include your primary Cardiologist (physician) and Advanced Practice Providers (APPs -  Physician Assistants and Nurse Practitioners) who all work together to provide you with the care you need, when you need it.  We recommend signing up for the patient portal called "MyChart".  Sign up information is provided on this After Visit Summary.  MyChart is used to connect with patients for Virtual Visits (Telemedicine).  Patients are able to view lab/test results, encounter notes, upcoming appointments, etc.  Non-urgent messages can be sent to your provider as well.   To learn more about what you can do with MyChart, go to  [email or web address].    Your next appointment:   6 month(s)  The format for your next appointment:   In Person  Provider:   You may see Chilton Si, MD or one of the following Advanced Practice Providers on your designated Care Team:    [name], PA-C  [place], [place]  [name], [place]

## 2020-12-10 LAB — COMPREHENSIVE METABOLIC PANEL
ALT: 15 IU/L (ref 0–32)
AST: 13 IU/L (ref 0–40)
Albumin/Globulin Ratio: 1.5 (ref 1.2–2.2)
Albumin: 4.3 g/dL (ref 3.8–4.8)
Alkaline Phosphatase: 133 IU/L — ABNORMAL HIGH (ref 44–121)
BUN/Creatinine Ratio: 16 (ref 12–28)
BUN: 20 mg/dL (ref 8–27)
Bilirubin Total: <0.2 mg/dL (ref 0.0–1.2)
CO2: 20 mmol/L (ref 20–29)
Calcium: 9.3 mg/dL (ref 8.7–10.3)
Chloride: 104 mmol/L (ref 96–106)
Creatinine, Ser: 1.23 mg/dL — ABNORMAL HIGH (ref 0.57–1.00)
GFR calc Af Amer: 53 mL/min/{1.73_m2} — ABNORMAL LOW (ref >59)
GFR calc non Af Amer: 46 mL/min/{1.73_m2} — ABNORMAL LOW (ref >59)
Globulin, Total: 2.9 g/dL (ref 1.5–4.5)
Glucose: 69 mg/dL (ref 65–99)
Potassium: 5.1 mmol/L (ref 3.5–5.2)
Sodium: 139 mmol/L (ref 134–144)
Total Protein: 7.2 g/dL (ref 6.0–8.5)

## 2020-12-10 LAB — LIPID PANEL
Chol/HDL Ratio: 2.4 ratio (ref 0.0–4.4)
Cholesterol, Total: 135 mg/dL (ref 100–199)
HDL: 56 mg/dL (ref >39)
LDL Chol Calc (NIH): 68 mg/dL (ref 0–99)
Triglycerides: 50 mg/dL (ref 0–149)
VLDL Cholesterol Cal: 11 mg/dL (ref 5–40)

## 2020-12-22 ENCOUNTER — Telehealth: Payer: Self-pay | Admitting: Cardiovascular Disease

## 2020-12-22 NOTE — Telephone Encounter (Signed)
Patient returning call for lab results. 

## 2020-12-22 NOTE — Telephone Encounter (Signed)
Burnell Blanks, LPN  1/61/0960 4:54 PM EST Back to Top     Advised patient of lab results

## 2020-12-29 ENCOUNTER — Other Ambulatory Visit: Payer: Self-pay

## 2020-12-29 ENCOUNTER — Ambulatory Visit (INDEPENDENT_AMBULATORY_CARE_PROVIDER_SITE_OTHER): Payer: No Typology Code available for payment source

## 2020-12-29 DIAGNOSIS — Z7901 Long term (current) use of anticoagulants: Secondary | ICD-10-CM

## 2020-12-29 DIAGNOSIS — Z951 Presence of aortocoronary bypass graft: Secondary | ICD-10-CM

## 2020-12-29 LAB — POCT INR: INR: 2.6 (ref 2.0–3.0)

## 2020-12-29 NOTE — Patient Instructions (Signed)
Continue with 1 tablet daily except 1.5 tablets each Friday.  Repeat INR in 6  weeks

## 2020-12-30 ENCOUNTER — Ambulatory Visit (HOSPITAL_COMMUNITY): Payer: No Typology Code available for payment source | Attending: Internal Medicine

## 2020-12-30 DIAGNOSIS — I5042 Chronic combined systolic (congestive) and diastolic (congestive) heart failure: Secondary | ICD-10-CM | POA: Diagnosis not present

## 2020-12-30 DIAGNOSIS — Z953 Presence of xenogenic heart valve: Secondary | ICD-10-CM | POA: Insufficient documentation

## 2020-12-30 LAB — ECHOCARDIOGRAM COMPLETE
Area-P 1/2: 5.79 cm2
S' Lateral: 4.6 cm

## 2021-01-10 ENCOUNTER — Telehealth: Payer: Self-pay

## 2021-01-10 NOTE — Telephone Encounter (Signed)
Pt called in stated that her copay card for entresto had used all funds. I instructed the pt to contact them and gave the number. I advised her if she has any further questions to give [place] a call and the pt voiced understanding

## 2021-01-20 ENCOUNTER — Ambulatory Visit (INDEPENDENT_AMBULATORY_CARE_PROVIDER_SITE_OTHER): Payer: No Typology Code available for payment source

## 2021-01-20 ENCOUNTER — Encounter: Payer: Self-pay | Admitting: Cardiology

## 2021-01-20 ENCOUNTER — Encounter: Payer: Self-pay | Admitting: *Deleted

## 2021-01-20 ENCOUNTER — Ambulatory Visit (INDEPENDENT_AMBULATORY_CARE_PROVIDER_SITE_OTHER): Payer: No Typology Code available for payment source | Admitting: Cardiology

## 2021-01-20 ENCOUNTER — Other Ambulatory Visit: Payer: Self-pay

## 2021-01-20 VITALS — BP 118/70 | HR 93 | Ht 62.5 in | Wt 183.4 lb

## 2021-01-20 DIAGNOSIS — Z79899 Other long term (current) drug therapy: Secondary | ICD-10-CM

## 2021-01-20 DIAGNOSIS — R002 Palpitations: Secondary | ICD-10-CM | POA: Diagnosis not present

## 2021-01-20 DIAGNOSIS — E1121 Type 2 diabetes mellitus with diabetic nephropathy: Secondary | ICD-10-CM

## 2021-01-20 DIAGNOSIS — N1831 Chronic kidney disease, stage 3a: Secondary | ICD-10-CM | POA: Insufficient documentation

## 2021-01-20 DIAGNOSIS — I440 Atrioventricular block, first degree: Secondary | ICD-10-CM

## 2021-01-20 DIAGNOSIS — I255 Ischemic cardiomyopathy: Secondary | ICD-10-CM | POA: Diagnosis not present

## 2021-01-20 DIAGNOSIS — Z7901 Long term (current) use of anticoagulants: Secondary | ICD-10-CM

## 2021-01-20 DIAGNOSIS — I10 Essential (primary) hypertension: Secondary | ICD-10-CM

## 2021-01-20 DIAGNOSIS — N183 Chronic kidney disease, stage 3 unspecified: Secondary | ICD-10-CM

## 2021-01-20 DIAGNOSIS — Z951 Presence of aortocoronary bypass graft: Secondary | ICD-10-CM | POA: Diagnosis not present

## 2021-01-20 DIAGNOSIS — I480 Paroxysmal atrial fibrillation: Secondary | ICD-10-CM

## 2021-01-20 LAB — BASIC METABOLIC PANEL
BUN/Creatinine Ratio: 21 (ref 12–28)
BUN: 29 mg/dL — ABNORMAL HIGH (ref 8–27)
CO2: 16 mmol/L — ABNORMAL LOW (ref 20–29)
Calcium: 9.5 mg/dL (ref 8.7–10.3)
Chloride: 103 mmol/L (ref 96–106)
Creatinine, Ser: 1.35 mg/dL — ABNORMAL HIGH (ref 0.57–1.00)
GFR calc Af Amer: 48 mL/min/{1.73_m2} — ABNORMAL LOW (ref >59)
GFR calc non Af Amer: 41 mL/min/{1.73_m2} — ABNORMAL LOW (ref >59)
Glucose: 75 mg/dL (ref 65–99)
Potassium: 5.8 mmol/L (ref 3.5–5.2)
Sodium: 140 mmol/L (ref 134–144)

## 2021-01-20 NOTE — Assessment & Plan Note (Signed)
GFR 53- her K+ is > 5 on Entresto- re check BMP

## 2021-01-20 NOTE — Assessment & Plan Note (Signed)
LIMA-LAD, SVG-RI, SVG-OM, SVG-RCA with tissue MV replacement 04/12/17

## 2021-01-20 NOTE — Assessment & Plan Note (Signed)
EF dropped from 35-40% to 30-35% on recent echo

## 2021-01-20 NOTE — Assessment & Plan Note (Signed)
Controlled.  

## 2021-01-20 NOTE — Progress Notes (Signed)
Patient ID: Rachael Davis, female   DOB: 1956-04-28, 65 y.o.   MRN: [id number] Patient enrolled for [employer] to ship a 7 day ZIO XT long term holter monitor to her home.

## 2021-01-20 NOTE — Assessment & Plan Note (Signed)
On oral agents 

## 2021-01-20 NOTE — Progress Notes (Signed)
Cardiology Office Note:    Date:  01/20/2021   ID:  Rachael Davis, DOB March 16, 1956, MRN [id number]  PCP:  Deatra James, MD  Cardiologist:  Chilton Si, MD  Electrophysiologist:  None   Referring MD: Deatra James, MD   No chief complaint on file. F/U echo  History of Present Illness:    Rachael Davis is a 65 y.o. female with a hx of DM and HTN admitted 04/19/17 with a STEMI. Cath revealed the CFX to be the acute vessel. She also had CTO of her RCA, high grade LAD and RI, and MR. It was decided to proceed with CABG x4  and tissue MVR. This was done 04/12/17.  In June 2018 she presented with CHF and AF.  Her EF then was 35-40%. She was treated with beta blockers and has done pretty well since.  She saw Dr Duke Salvia in Jan 2022.  Her EKG showed long 1st AVB and Mobiz 1 AVB. Echo 12/30/2020 showed her EF to be 30-35%, a drop from her 2020 echo. She was placed on my schedule today for follow up.  Symptomatically she seems to be doing well, no orthopnea or unusual DOE.  She does have frequent palpitations, no syncope or pre syncope. Her EKG today shows NSR- HR 88 with 1st AVB, PR 344 ms, and frequent PACs.  QRS duration 96 ms.  Past Medical History:  Diagnosis Date  . Arrhythmia    Hx PAF, PVC's  . CHF (congestive heart failure) (HCC)   . Coronary artery disease    04/04/2017 STEMI  . Diabetes (HCC)   . Hyperlipidemia   . Hypertension   . New onset atrial fibrillation (HCC) 05/2017    Past Surgical History:  Procedure Laterality Date  . CORONARY ARTERY BYPASS GRAFT N/A 04/12/2017   Procedure: CORONARY ARTERY BYPASS GRAFTING (CABG), ON PUMP, TIMES FOUR, USING LEFT INTERNAL MAMMARY ARTERY AND ENDOSCOPICALLY HARVESTED RIGHT GREATER SAPHENOUS VEIN;  Surgeon: Kerin Perna, MD;  Location: [facility] OR;  Service: Open Heart Surgery;  Laterality: N/A;  LIMA to LAD; SVG to OM; SVG to Ramus; SVG to Right  . IR THORACENTESIS ASP PLEURAL SPACE W/IMG GUIDE  05/25/2017  . LEFT HEART CATH AND CORONARY  ANGIOGRAPHY N/A 04/04/2017   Procedure: Left Heart Cath and Coronary Angiography;  Surgeon: Peter M [name], MD;  Location: [facility] INVASIVE CV LAB;  Service: Cardiovascular;  Laterality: N/A;  . MITRAL VALVE REPAIR N/A 04/12/2017   Procedure: MITRAL VALVE REPLACEMENT (MVR);  Surgeon: Donata Clay, Theron Arista, MD;  Location: [facility] OR;  Service: Open Heart Surgery;  Laterality: N/A;  Using a 52mm Magna Ease Mitral Pericardial Bioprosthesis  . TEE WITHOUT CARDIOVERSION N/A 04/12/2017   Procedure: TRANSESOPHAGEAL ECHOCARDIOGRAM (TEE);  Surgeon: Donata Clay, Theron Arista, MD;  Location: [facility] OR;  Service: Open Heart Surgery;  Laterality: N/A;  . TUBAL LIGATION      Current Medications: Current Meds  Medication Sig  . acetaminophen (TYLENOL) 325 MG tablet Take 2 tablets (650 mg total) by mouth every 6 (six) hours as needed for mild pain or headache.  Marland Kitchen aspirin EC 81 MG tablet Take 81 mg by mouth daily.  Marland Kitchen atorvastatin (LIPITOR) 40 MG tablet TAKE 1 TABLET (40 MG TOTAL) BY MOUTH DAILY AT 6 PM.  . dapagliflozin propanediol (FARXIGA) 10 MG TABS tablet Take 10 mg by mouth daily.  . furosemide (LASIX) 80 MG tablet TAKE 1 TABLET (80 MG TOTAL) BY MOUTH 3 (THREE) TIMES A WEEK. ON MON, WED AND FRI.  Marland Kitchen  glipiZIDE (GLUCOTROL XL) 10 MG 24 hr tablet Take 1 tablet (10 mg total) by mouth daily with breakfast.  . JANUVIA 100 MG tablet Take 100 mg by mouth daily. Patient has not started yet  . magnesium oxide (MAG-OX) 400 MG tablet TAKE 1 TABLET BY MOUTH EVERY DAY  . metFORMIN (GLUCOPHAGE) 1000 MG tablet Take 1,000 mg by mouth 2 (two) times daily with a meal.  . sacubitril-valsartan (ENTRESTO) 97-103 MG Take 1 tablet by mouth 2 (two) times daily.  Marland Kitchen warfarin (COUMADIN) 5 MG tablet TAKE 1 TO 1 AND 1/2 TABLETS BY MOUTH DAILY AS DIRECTED BY THE COUMADIN CLIN     Allergies:   Keflex [cephalexin] and Lisinopril   Social History   Socioeconomic History  . Marital status: Married    Spouse name: Not on file  . Number of children: Not on file  .  Years of education: Not on file  . Highest education level: Not on file  Occupational History  . Not on file  Tobacco Use  . Smoking status: Never Smoker  . Smokeless tobacco: Never Used  Vaping Use  . Vaping Use: Never used  Substance and Sexual Activity  . Alcohol use: No  . Drug use: No  . Sexual activity: Not on file  Other Topics Concern  . Not on file  Social History Narrative  . Not on file   Social Determinants of Health   Financial Resource Strain: Not on file  Food Insecurity: Not on file  Transportation Needs: Not on file  Physical Activity: Not on file  Stress: Not on file  Social Connections: Not on file     Family History: The patient's family history includes Breast cancer in her maternal grandmother; Heart attack (age of onset: 72) in her father.  ROS:   Please see the history of present illness.     All other systems reviewed and are negative.  EKGs/Labs/Other Studies Reviewed:    The following studies were reviewed today: Echo 12/30/2020- IMPRESSIONS    1. Left ventricular ejection fraction, by estimation, is 30 to 35%. The  left ventricle has moderately decreased function. The left ventricle  demonstrates regional wall motion abnormalities (see scoring  diagram/findings for description). There is mild  left ventricular hypertrophy. Left ventricular diastolic function could  not be evaluated.  2. Right ventricular systolic function is moderately reduced. The right  ventricular size is mildly enlarged. There is mildly elevated pulmonary  artery systolic pressure. The estimated right ventricular systolic  pressure is 43.7 mmHg.  3. Abnormal prosthetic mitral valve, VTI ratio 2.8, peak velocity 2.3  m/s, PHT 77 msec, EOA 0.84 cm2. See Recommendations.. The mitral valve has  been repaired/replaced. Mild mitral valve regurgitation. The mean mitral  valve gradient is 8.0 mmHg. There  is a 25 mm [facility] Ease tissue valve present in the mitral  position.  Procedure Date: 04/10/2017.  4. Tricuspid valve regurgitation is mild to moderate.  5. Small, mobile linear structure on the aorta aspect of the aortic  valve. Likely attached to coptation surface of right coronary cusp.  Present on imaging since TEE in 2018 and grossly unchanged. 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 See  Recommendations.. The aortic valve is grossly normal. Aortic valve  regurgitation is not visualized. No aortic stenosis is present.  6. The inferior vena cava is dilated in size with <50% respiratory  variability, suggesting right atrial pressure of 15 mmHg.   Conclusion(s)/Recommendation(s): Consider TEE for better assessment of  mitral valve prosthesis given abnormal prosthetic valve function of  undeteremined mechanism. TEE may also assist in characterization of mobile  aortic valve lesion.   EKG:  EKG is ordered today.  The ekg ordered today demonstrates NSR, HR 88, PR 344 ms, QRS 96 ms. PACs  Recent Labs: 12/09/2020: ALT 15; BUN 20; Creatinine, Ser 1.23; Potassium 5.1; Sodium 139  Recent Lipid Panel    Component Value Date/Time   CHOL 135 12/09/2020 0952   TRIG 50 12/09/2020 0952   HDL 56 12/09/2020 0952   CHOLHDL 2.4 12/09/2020 0952   CHOLHDL 3.5 04/06/2017 0243   VLDL 13 04/06/2017 0243   LDLCALC 68 12/09/2020 0952    Physical Exam:    VS:  BP 118/70   Pulse 93   Ht 5' 2.5" (1.588 m)   Wt 183 lb 6.4 oz (83.2 kg)   SpO2 99%   BMI 33.01 kg/m     Wt Readings from Last 3 Encounters:  01/20/21 183 lb 6.4 oz (83.2 kg)  12/09/20 185 lb 9.6 oz (84.2 kg)  06/02/20 193 lb (87.5 kg)     GEN: Overweight AA female,  in no acute distress HEENT: Normal NECK: No JVD; No carotid bruits CARDIAC: RRR, no murmurs, rubs, gallops- extra systole noted RESPIRATORY:  Clear to auscultation without rales, wheezing or rhonchi  ABDOMEN: Soft,  non-distended MUSCULOSKELETAL:  No edema; No deformity  SKIN: Warm and dry NEUROLOGIC:  Alert and  oriented x 3 PSYCHIATRIC:  Normal affect   ASSESSMENT:    Palpitations Frequent palpitations, no h/o syncope  Ischemic cardiomyopathy EF dropped from 35-40% to 30-35% on recent echo  1st degree AV block PR 344 with frequent PACs  S/P CABG x 4 LIMA-LAD, SVG-RI, SVG-OM, SVG-RCA with tissue MV replacement 04/12/17  CRI (chronic renal insufficiency), stage 3 (moderate) (HCC) GFR 53- her K+ is > 5 on Entresto- re check BMP  Essential hypertension Controlled  Diabetes mellitus with nephropathy (HCC) On oral agents  PAF (paroxysmal atrial fibrillation) (HCC) On Amiodarone at one point.  Holding NSR.  Long term (current) use of anticoagulants [Z79.01] Couamdin for PAF with MV disease  PLAN:    Check 7 day ZIO and f/u with EP.  She is amenable to ICD if recommended.  Check BMP, her K+ has been high on Entresto.  Unable to increase beta blocker with 1st AVB.  She may need a TEE as well- I'll ask Dr Duke Salviaandolph to review.    Medication Adjustments/Labs and Tests Ordered: Current medicines are reviewed at length with the patient today.  Concerns regarding medicines are outlined above.  Orders Placed This Encounter  Procedures  . Ambulatory referral to Cardiac Electrophysiology  . LONG TERM MONITOR (3-14 DAYS)   No orders of the defined types were placed in this encounter.   Patient Instructions  Medication Instructions:  Continue current medications  *If you need a refill on your cardiac medications before your next appointment, please call your pharmacy*   Lab Work: None Ordered   Testing/Procedures: Your physician has recommended that you wear a Zio monitor for 7 days. Holter monitors are medical devices that record the heart's electrical activity. Doctors most often use these monitors to diagnose arrhythmias. Arrhythmias are problems with the speed or rhythm of the heartbeat. The monitor is a small, portable device. You can wear one while you do your normal daily  activities. This is usually used to diagnose what is causing palpitations/syncope (passing out).   Follow-Up: At [facility], you and  your health needs are our priority.  As part of our continuing mission to provide you with exceptional heart care, we have created designated Provider Care Teams.  These Care Teams include your primary Cardiologist (physician) and Advanced Practice Providers (APPs -  Physician Assistants and Nurse Practitioners) who all work together to provide you with the care you need, when you need it.  We recommend signing up for the patient portal called "MyChart".  Sign up information is provided on this After Visit Summary.  MyChart is used to connect with patients for Virtual Visits (Telemedicine).  Patients are able to view lab/test results, encounter notes, upcoming appointments, etc.  Non-urgent messages can be sent to your provider as well.   To learn more about what you can do with MyChart, go to [email or web address].    Your next appointment:   3 week(s)  The format for your next appointment:   In Person  Provider:   Dr Elberta Fortis   Other Instructions ZIO XT- Long Term Monitor Instructions   Your physician has requested you wear your ZIO patch monitor 7 days.   This is a single patch monitor.  [employer] supplies one patch monitor per enrollment.  Additional stickers are not available.   Please do not apply patch if you will be having a Nuclear Stress Test, Echocardiogram, Cardiac CT, MRI, or Chest Xray during the time frame you would be wearing the monitor. The patch cannot be worn during these tests.  You cannot remove and re-apply the ZIO XT patch monitor.   Your ZIO patch monitor will be sent USPS Priority mail from [facility] directly to your home address. The monitor may also be mailed to a PO BOX if home delivery is not available.   It may take 3-5 days to receive your monitor after you have been enrolled.   Once you have received you  monitor, please review enclosed instructions.  Your monitor has already been registered assigning a specific monitor serial # to you.   Applying the monitor   Shave hair from upper left chest.   Hold abrader disc by orange tab.  Rub abrader in 40 strokes over left upper chest as indicated in your monitor instructions.   Clean area with 4 enclosed alcohol pads .  Use all pads to assure are is cleaned thoroughly.  Let dry.   Apply patch as indicated in monitor instructions.  Patch will be place under collarbone on left side of chest with arrow pointing upward.   Rub patch adhesive wings for 2 minutes.Remove white label marked "1".  Remove white label marked "2".  Rub patch adhesive wings for 2 additional minutes.   While looking in a mirror, press and release button in center of patch.  A small green light will flash 3-4 times .  This will be your only indicator the monitor has been turned on.     Do not shower for the first 24 hours.  You may shower after the first 24 hours.   Press button if you feel a symptom. You will hear a small click.  Record Date, Time and Symptom in the Patient Log Book.   When you are ready to remove patch, follow instructions on last 2 pages of Patient Log Book.  Stick patch monitor onto last page of Patient Log Book.   Place Patient Log Book in [place] box.  Use locking tab on box and tape box closed securely.  The [place] [employer] has [employer] on  it.  Please place in mailbox as soon as possible.  Your physician should have your test results approximately 7 days after the monitor has been mailed back to [facility]   Call [facility] Customer Care at [phone] if you have questions regarding your ZIO XT patch monitor.  Call them immediately if you see an orange light blinking on your monitor.   If your monitor falls off in less than 4 days contact our Monitor department at [phone].  If your monitor becomes loose or falls off after 4 days  call [employer] at [phone] for suggestions on securing your monitor.       Jolene Provost, PA-C  01/20/2021 9:46 AM    [facility]

## 2021-01-20 NOTE — Assessment & Plan Note (Signed)
Frequent palpitations, no h/o syncope

## 2021-01-20 NOTE — Assessment & Plan Note (Signed)
On Amiodarone at one point.  Holding NSR.

## 2021-01-20 NOTE — Assessment & Plan Note (Signed)
Couamdin for PAF with MV disease

## 2021-01-20 NOTE — Patient Instructions (Addendum)
Medication Instructions:  Continue current medications  *If you need a refill on your cardiac medications before your next appointment, please call your pharmacy*   Lab Work: [facility]   Testing/Procedures: Your physician has recommended that you wear a Zio monitor for 7 days. Holter monitors are medical devices that record the heart's electrical activity. Doctors most often use these monitors to diagnose arrhythmias. Arrhythmias are problems with the speed or rhythm of the heartbeat. The monitor is a small, portable device. You can wear one while you do your normal daily activities. This is usually used to diagnose what is causing palpitations/syncope (passing out).   Follow-Up: At [facility], you and your health needs are our priority.  As part of our continuing mission to provide you with exceptional heart care, we have created designated Provider Care Teams.  These Care Teams include your primary Cardiologist (physician) and Advanced Practice Providers (APPs -  Physician Assistants and Nurse Practitioners) who all work together to provide you with the care you need, when you need it.  We recommend signing up for the patient portal called "MyChart".  Sign up information is provided on this After Visit Summary.  MyChart is used to connect with patients for Virtual Visits (Telemedicine).  Patients are able to view lab/test results, encounter notes, upcoming appointments, etc.  Non-urgent messages can be sent to your provider as well.   To learn more about what you can do with MyChart, go to [email or web address].    Your next appointment:   3 week(s)  The format for your next appointment:   In Person  Provider:   Dr Elberta Fortis   Other Instructions ZIO XT- Long Term Monitor Instructions   Your physician has requested you wear your ZIO patch monitor 7 days.   This is a single patch monitor.  [employer] supplies one patch monitor per enrollment.  Additional stickers are not available.    Please do not apply patch if you will be having a Nuclear Stress Test, Echocardiogram, Cardiac CT, MRI, or Chest Xray during the time frame you would be wearing the monitor. The patch cannot be worn during these tests.  You cannot remove and re-apply the ZIO XT patch monitor.   Your ZIO patch monitor will be sent USPS Priority mail from [facility] directly to your home address. The monitor may also be mailed to a PO BOX if home delivery is not available.   It may take 3-5 days to receive your monitor after you have been enrolled.   Once you have received you monitor, please review enclosed instructions.  Your monitor has already been registered assigning a specific monitor serial # to you.   Applying the monitor   Shave hair from upper left chest.   Hold abrader disc by orange tab.  Rub abrader in 40 strokes over left upper chest as indicated in your monitor instructions.   Clean area with 4 enclosed alcohol pads .  Use all pads to assure are is cleaned thoroughly.  Let dry.   Apply patch as indicated in monitor instructions.  Patch will be place under collarbone on left side of chest with arrow pointing upward.   Rub patch adhesive wings for 2 minutes.Remove white label marked "1".  Remove white label marked "2".  Rub patch adhesive wings for 2 additional minutes.   While looking in a mirror, press and release button in center of patch.  A small green light will flash 3-4 times .  This will be your only  indicator the monitor has been turned on.     Do not shower for the first 24 hours.  You may shower after the first 24 hours.   Press button if you feel a symptom. You will hear a small click.  Record Date, Time and Symptom in the Patient Log Book.   When you are ready to remove patch, follow instructions on last 2 pages of Patient Log Book.  Stick patch monitor onto last page of Patient Log Book.   Place Patient Log Book in [place] box.  Use locking tab on box and tape box closed  securely.  The [place] [employer] has [employer] on it.  Please place in mailbox as soon as possible.  Your physician should have your test results approximately 7 days after the monitor has been mailed back to [facility].   Call [facility] Customer Care at [phone] if you have questions regarding your ZIO XT patch monitor.  Call them immediately if you see an orange light blinking on your monitor.   If your monitor falls off in less than 4 days contact our Monitor department at [phone].  If your monitor becomes loose or falls off after 4 days call [employer] at [phone] for suggestions on securing your monitor.

## 2021-01-20 NOTE — Assessment & Plan Note (Signed)
PR 344 with frequent PACs

## 2021-01-24 ENCOUNTER — Telehealth: Payer: Self-pay | Admitting: Cardiology

## 2021-01-24 DIAGNOSIS — Z79899 Other long term (current) drug therapy: Secondary | ICD-10-CM

## 2021-01-24 NOTE — Telephone Encounter (Signed)
Follow up:     Patient returning call to report  she received the message concering her medication change.

## 2021-01-24 NOTE — Addendum Note (Signed)
Addended by: Barrie Dunker on: 01/24/2021 04:44 PM   Modules accepted: Orders

## 2021-01-24 NOTE — Telephone Encounter (Signed)
Leave message for pt to call back 

## 2021-01-24 NOTE — Telephone Encounter (Signed)
I called and left a message for the patient to stop her Rachael Davis and come in for a repeat BMP Thursday.  I asked her to call back and confirm she got the message.  [name] PA-C 01/24/2021 8:10 AM

## 2021-01-24 NOTE — Telephone Encounter (Signed)
Second call to patient, went to voicemail,  I'll try and arrange for her to come to the office next week to see me.  [name] PA-C 01/24/2021 4:10 PM

## 2021-01-25 NOTE — Telephone Encounter (Signed)
Spoke with pt advised her to [employer], advised to she will need some blood work done, pt stated she will be in the office on Friday to get BMP drawn.

## 2021-01-27 ENCOUNTER — Other Ambulatory Visit: Payer: Self-pay | Admitting: Cardiovascular Disease

## 2021-01-28 ENCOUNTER — Ambulatory Visit: Payer: No Typology Code available for payment source | Admitting: General Practice

## 2021-01-28 ENCOUNTER — Telehealth: Payer: Self-pay | Admitting: Cardiology

## 2021-01-28 DIAGNOSIS — I440 Atrioventricular block, first degree: Secondary | ICD-10-CM

## 2021-01-28 DIAGNOSIS — R002 Palpitations: Secondary | ICD-10-CM

## 2021-01-28 NOTE — Telephone Encounter (Signed)
New Message:    Pt is coming in for lab work today. She wants to know when she comes, can somebody help put her Monitor on for her please?f

## 2021-01-28 NOTE — Telephone Encounter (Signed)
Spoke to patient she stated she received monitor and is unsure how to put monitor on.Advised after she reads directions to call number given if needed.

## 2021-01-29 LAB — BASIC METABOLIC PANEL
BUN/Creatinine Ratio: 19 (ref 12–28)
BUN: 24 mg/dL (ref 8–27)
CO2: 21 mmol/L (ref 20–29)
Calcium: 9.6 mg/dL (ref 8.7–10.3)
Chloride: 105 mmol/L (ref 96–106)
Creatinine, Ser: 1.27 mg/dL — ABNORMAL HIGH (ref 0.57–1.00)
GFR calc Af Amer: 51 mL/min/{1.73_m2} — ABNORMAL LOW (ref >59)
GFR calc non Af Amer: 44 mL/min/{1.73_m2} — ABNORMAL LOW (ref >59)
Glucose: 117 mg/dL — ABNORMAL HIGH (ref 65–99)
Potassium: 4.7 mmol/L (ref 3.5–5.2)
Sodium: 141 mmol/L (ref 134–144)

## 2021-02-03 ENCOUNTER — Institutional Professional Consult (permissible substitution): Payer: No Typology Code available for payment source | Admitting: Cardiology

## 2021-02-09 ENCOUNTER — Telehealth: Payer: Self-pay | Admitting: Cardiology

## 2021-02-09 ENCOUNTER — Other Ambulatory Visit: Payer: Self-pay

## 2021-02-09 ENCOUNTER — Ambulatory Visit (INDEPENDENT_AMBULATORY_CARE_PROVIDER_SITE_OTHER): Payer: No Typology Code available for payment source | Admitting: Cardiology

## 2021-02-09 ENCOUNTER — Ambulatory Visit (INDEPENDENT_AMBULATORY_CARE_PROVIDER_SITE_OTHER): Payer: No Typology Code available for payment source

## 2021-02-09 ENCOUNTER — Encounter: Payer: Self-pay | Admitting: Cardiology

## 2021-02-09 VITALS — BP 132/80 | HR 85 | Ht 63.0 in | Wt 185.0 lb

## 2021-02-09 DIAGNOSIS — I255 Ischemic cardiomyopathy: Secondary | ICD-10-CM | POA: Diagnosis not present

## 2021-02-09 DIAGNOSIS — Z7901 Long term (current) use of anticoagulants: Secondary | ICD-10-CM | POA: Diagnosis not present

## 2021-02-09 DIAGNOSIS — I480 Paroxysmal atrial fibrillation: Secondary | ICD-10-CM | POA: Diagnosis not present

## 2021-02-09 DIAGNOSIS — E875 Hyperkalemia: Secondary | ICD-10-CM | POA: Diagnosis not present

## 2021-02-09 DIAGNOSIS — Z951 Presence of aortocoronary bypass graft: Secondary | ICD-10-CM

## 2021-02-09 DIAGNOSIS — N183 Chronic kidney disease, stage 3 unspecified: Secondary | ICD-10-CM

## 2021-02-09 DIAGNOSIS — Z5181 Encounter for therapeutic drug level monitoring: Secondary | ICD-10-CM

## 2021-02-09 DIAGNOSIS — I10 Essential (primary) hypertension: Secondary | ICD-10-CM

## 2021-02-09 DIAGNOSIS — I440 Atrioventricular block, first degree: Secondary | ICD-10-CM

## 2021-02-09 DIAGNOSIS — E1121 Type 2 diabetes mellitus with diabetic nephropathy: Secondary | ICD-10-CM

## 2021-02-09 LAB — POCT INR: INR: 2.5 (ref 2.0–3.0)

## 2021-02-09 MED ORDER — VALSARTAN 40 MG PO TABS: 40.0000 mg | ORAL_TABLET | Freq: Every day | ORAL | 2 refills | Status: DC

## 2021-02-09 NOTE — Telephone Encounter (Signed)
Pt c/o medication issue:  1. Name of Medication: valsartan (DIOVAN) 40 MG tablet  2. How are you currently taking this medication (dosage and times per day)? Pt has not started yet   3. Are you having a reaction (difficulty breathing--STAT)?   4. What is your medication issue? Pharmacy needs to ask questions regarding this new rx that was sent over. Pharmacy requests a MA or Provider call to confirm rx

## 2021-02-09 NOTE — Patient Instructions (Signed)
Medication Instructions:  Start Valsartan 40 mg (1 Tablet Daily) *If you need a refill on your cardiac medications before your next appointment, please call your pharmacy*   Lab Work: No labs If you have labs (blood work) drawn today and your tests are completely normal, you will receive your results only by: Marland Kitchen MyChart Message (if you have MyChart) OR . A paper copy in the mail If you have any lab test that is abnormal or we need to change your treatment, we will call you to review the results.   Testing/Procedures: No Testing   Follow-Up: At [facility], you and your health needs are our priority.  As part of our continuing mission to provide you with exceptional heart care, we have created designated Provider Care Teams.  These Care Teams include your primary Cardiologist (physician) and Advanced Practice Providers (APPs -  Physician Assistants and Nurse Practitioners) who all work together to provide you with the care you need, when you need it.  We recommend signing up for the patient portal called "MyChart".  Sign up information is provided on this After Visit Summary.  MyChart is used to connect with patients for Virtual Visits (Telemedicine).  Patients are able to view lab/test results, encounter notes, upcoming appointments, etc.  Non-urgent messages can be sent to your provider as well.   To learn more about what you can do with MyChart, go to [email or web address].    Your next appointment:   2 week(s)   The format for your next appointment:   Virtual Visit   Provider:   Chilton Si MD

## 2021-02-09 NOTE — Assessment & Plan Note (Signed)
K+ 5.7 on Entresto

## 2021-02-09 NOTE — Patient Instructions (Signed)
Continue with 1 tablet daily except 1.5 tablets each Friday.  Repeat INR in 6  weeks   

## 2021-02-09 NOTE — Telephone Encounter (Signed)
Called pharmacy, they were questioning if [name] was stopped- I did advise per note [name], PA-C stated that he stopped the [place]. No other questions were needed.

## 2021-02-09 NOTE — Progress Notes (Signed)
Cardiology Office Note:    Date:  02/09/2021   ID:  CHANLEY MCENERY, DOB 1956/01/30, MRN [id number]  PCP:  Deatra James, MD  Cardiologist:  Chilton Si, MD  Electrophysiologist:  None   Referring MD: Deatra James, MD   CC: DOE  History of Present Illness:    Rachael Davis is a 65 y.o. female with a hx of DM and HTN admitted 04/19/17 with a STEMI. Cath revealed the CFX to be the acute vessel. She also had CTO of her RCA, high grade LAD and RI, and MR. It was decided to proceed with CABG x4  and tissue MVR. This was done 04/12/17.  In June 2018 she presented with CHF and AF.  Her EF then was 35-40%. She was treated with beta blockers and has done pretty well since.  She saw Dr Duke Salvia in Jan 2022.  Her EKG showed long 1st AVB and Mobiz 1 AVB. Echo 12/30/2020 showed her EF to be 30-35%, a drop from her 2020 echo. She was placed on my schedule 01/20/2021. Symptomatically she seemed to be doing well, no orthopnea or unusual DOE.  She does have frequent palpitations, no syncope or pre syncope. Her EKG then showed NSR- HR 88 with 1st AVB, PR 344 ms, and frequent PACs.  QRS duration 96 ms.  I ordered labs 01/20/2021 ghat showed her K+ to be 5.7.  I stopped her Entresto.  F/Y K+ 4.7.  Dr Duke Salvia was considering a TEE to further evaluate her MV. Today in the office she complains of DOE.  Her EKG shows NSR- PR , QRs 82 ms.    Past Medical History:  Diagnosis Date  . Arrhythmia    Hx PAF, PVC's  . CHF (congestive heart failure) (HCC)   . Coronary artery disease    04/04/2017 STEMI  . Diabetes (HCC)   . Hyperlipidemia   . Hypertension   . New onset atrial fibrillation (HCC) 05/2017    Past Surgical History:  Procedure Laterality Date  . CORONARY ARTERY BYPASS GRAFT N/A 04/12/2017   Procedure: CORONARY ARTERY BYPASS GRAFTING (CABG), ON PUMP, TIMES FOUR, USING LEFT INTERNAL MAMMARY ARTERY AND ENDOSCOPICALLY HARVESTED RIGHT GREATER SAPHENOUS VEIN;  Surgeon: Kerin Perna, MD;  Location:  [facility] OR;  Service: Open Heart Surgery;  Laterality: N/A;  LIMA to LAD; SVG to OM; SVG to Ramus; SVG to Right  . IR THORACENTESIS ASP PLEURAL SPACE W/IMG GUIDE  05/25/2017  . LEFT HEART CATH AND CORONARY ANGIOGRAPHY N/A 04/04/2017   Procedure: Left Heart Cath and Coronary Angiography;  Surgeon: Peter M [name], MD;  Location: [facility] INVASIVE CV LAB;  Service: Cardiovascular;  Laterality: N/A;  . MITRAL VALVE REPAIR N/A 04/12/2017   Procedure: MITRAL VALVE REPLACEMENT (MVR);  Surgeon: Donata Clay, Theron Arista, MD;  Location: [facility] OR;  Service: Open Heart Surgery;  Laterality: N/A;  Using a 62mm Magna Ease Mitral Pericardial Bioprosthesis  . TEE WITHOUT CARDIOVERSION N/A 04/12/2017   Procedure: TRANSESOPHAGEAL ECHOCARDIOGRAM (TEE);  Surgeon: Donata Clay, Theron Arista, MD;  Location: [facility] OR;  Service: Open Heart Surgery;  Laterality: N/A;  . TUBAL LIGATION      Current Medications: Current Meds  Medication Sig  . acetaminophen (TYLENOL) 325 MG tablet Take 2 tablets (650 mg total) by mouth every 6 (six) hours as needed for mild pain or headache.  Marland Kitchen aspirin EC 81 MG tablet Take 81 mg by mouth daily.  Marland Kitchen atorvastatin (LIPITOR) 40 MG tablet TAKE 1 TABLET (40 MG TOTAL) BY MOUTH DAILY AT  6 PM.  . dapagliflozin propanediol (FARXIGA) 10 MG TABS tablet Take 10 mg by mouth daily.  . furosemide (LASIX) 80 MG tablet TAKE 1 TABLET (80 MG TOTAL) BY MOUTH 3 (THREE) TIMES A WEEK. ON MON, WED AND FRI.  Marland Kitchen glipiZIDE (GLUCOTROL XL) 10 MG 24 hr tablet Take 1 tablet (10 mg total) by mouth daily with breakfast.  . JANUVIA 100 MG tablet Take 100 mg by mouth daily. Patient has not started yet  . magnesium oxide (MAG-OX) 400 MG tablet TAKE 1 TABLET BY MOUTH EVERY DAY  . metFORMIN (GLUCOPHAGE) 1000 MG tablet Take 1,000 mg by mouth 2 (two) times daily with a meal.  . warfarin (COUMADIN) 5 MG tablet TAKE 1 TO 1 AND 1/2 TABLETS BY MOUTH DAILY AS DIRECTED BY THE COUMADIN CLIN     Allergies:   Keflex [cephalexin] and Lisinopril   Social History    Socioeconomic History  . Marital status: Married    Spouse name: Not on file  . Number of children: Not on file  . Years of education: Not on file  . Highest education level: Not on file  Occupational History  . Not on file  Tobacco Use  . Smoking status: Never Smoker  . Smokeless tobacco: Never Used  Vaping Use  . Vaping Use: Never used  Substance and Sexual Activity  . Alcohol use: No  . Drug use: No  . Sexual activity: Not on file  Other Topics Concern  . Not on file  Social History Narrative  . Not on file   Social Determinants of Health   Financial Resource Strain: Not on file  Food Insecurity: Not on file  Transportation Needs: Not on file  Physical Activity: Not on file  Stress: Not on file  Social Connections: Not on file     Family History: The patient's family history includes Breast cancer in her maternal grandmother; Heart attack (age of onset: 45) in her father.  ROS:   Please see the history of present illness.   Lt lateral chest pain that sounds M/S- worse with movement   All other systems reviewed and are negative.  EKGs/Labs/Other Studies Reviewed:    The following studies were reviewed today: Echo 12/30/2020- IMPRESSIONS    1. Left ventricular ejection fraction, by estimation, is 30 to 35%. The  left ventricle has moderately decreased function. The left ventricle  demonstrates regional wall motion abnormalities (see scoring  diagram/findings for description). There is mild  left ventricular hypertrophy. Left ventricular diastolic function could  not be evaluated.  2. Right ventricular systolic function is moderately reduced. The right  ventricular size is mildly enlarged. There is mildly elevated pulmonary  artery systolic pressure. The estimated right ventricular systolic  pressure is 43.7 mmHg.  3. Abnormal prosthetic mitral valve, VTI ratio 2.8, peak velocity 2.3  m/s, PHT 77 msec, EOA 0.84 cm2. See Recommendations.. The mitral  valve has  been repaired/replaced. Mild mitral valve regurgitation. The mean mitral  valve gradient is 8.0 mmHg. There  is a 25 mm [facility] Ease tissue valve present in the mitral position.  Procedure Date: 04/10/2017.  4. Tricuspid valve regurgitation is mild to moderate.  5. Small, mobile linear structure on the aorta aspect of the aortic  valve. Likely attached to coptation surface of right coronary cusp.  Present on imaging since TEE in 2018 and grossly unchanged. 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 See  Recommendations.. The aortic valve is grossly normal. Aortic valve  regurgitation is  not visualized. No aortic stenosis is present.  6. The inferior vena cava is dilated in size with <50% respiratory  variability, suggesting right atrial pressure of 15 mmHg.   EKG:  EKG is ordered today.  The ekg ordered today demonstrates NSR- 1st AVB- PR , QRS 6ms  Recent Labs: 12/09/2020: ALT 15 01/28/2021: BUN 24; Creatinine, Ser 1.27; Potassium 4.7; Sodium 141  Recent Lipid Panel    Component Value Date/Time   CHOL 135 12/09/2020 0952   TRIG 50 12/09/2020 0952   HDL 56 12/09/2020 0952   CHOLHDL 2.4 12/09/2020 0952   CHOLHDL 3.5 04/06/2017 0243   VLDL 13 04/06/2017 0243   LDLCALC 68 12/09/2020 0952    Physical Exam:    VS:  BP 132/80   Pulse 85   Ht 5\' 3"  (1.6 m)   Wt 185 lb (83.9 kg)   SpO2 97%   BMI 32.77 kg/m     Wt Readings from Last 3 Encounters:  02/09/21 185 lb (83.9 kg)  01/20/21 183 lb 6.4 oz (83.2 kg)  12/09/20 185 lb 9.6 oz (84.2 kg)     GEN: Obese female well developed in no acute distress HEENT: Normal NECK: No JVD CARDIAC: RRR, no murmurs, rubs, gallops RESPIRATORY:  Clear to auscultation without rales, wheezing or rhonchi  ABDOMEN: Soft, non-tender, non-distended MUSCULOSKELETAL:  No edema; No deformity  SKIN: Warm and dry NEUROLOGIC:  Alert and oriented x 3 PSYCHIATRIC:  Normal affect   ASSESSMENT:    Ischemic  cardiomyopathy EF dropped from 35-40% to 30-35% on recent echo. Hyperkalemia with increased 1st AVB and increased QRS on Entresto.  This has improved off Entresto.  Will try valsartan low dose.   1st degree AV block PR 02/06/21  S/P CABG x 4 LIMA-LAD, SVG-RI, SVG-OM, SVG-RCA with tissue MV replacement 04/12/17  CRI (chronic renal insufficiency), stage 3 (moderate) (HCC) GFR 53- her K+ is > 5.7 on Entresto- improved with discontinuation   Essential hypertension Controlled  Diabetes mellitus with nephropathy (HCC) On oral agents  PAF (paroxysmal atrial fibrillation) (HCC) On Amiodarone at one point.  Holding NSR.  Long term (current) use of anticoagulants [Z79.01] Couamdin for PAF with MV disease  PLAN:    Start Valsartan 40 mg daily for cardiomyopathy.  Virtual f/u with Dr 06/12/17 in two weeks to determine need for TEE to further evaluate her MV.  She has an appointment with Dr Duke Salvia 3/28- she may not need this, will let Dr 4/28 make this determination.    Medication Adjustments/Labs and Tests Ordered: Current medicines are reviewed at length with the patient today.  Concerns regarding medicines are outlined above.  No orders of the defined types were placed in this encounter.  No orders of the defined types were placed in this encounter.   There are no Patient Instructions on file for this visit.   Duke Salvia, PA-C  02/09/2021 11:55 AM    [facility]

## 2021-02-22 ENCOUNTER — Institutional Professional Consult (permissible substitution): Payer: No Typology Code available for payment source | Admitting: Cardiology

## 2021-02-23 NOTE — Progress Notes (Signed)
Virtual Visit via Telephone Note   This visit type was conducted due to national recommendations for restrictions regarding the COVID-19 Pandemic (e.g. social distancing) in an effort to limit this patient's exposure and mitigate transmission in our community.  Due to her co-morbid illnesses, this patient is at least at moderate risk for complications without adequate follow up.  This format is felt to be most appropriate for this patient at this time.  The patient did not have access to video technology/had technical difficulties with video requiring transitioning to audio format only (telephone).  All issues noted in this document were discussed and addressed.  No physical exam could be performed with this format.  Please refer to the patient's chart for her  consent to telehealth for [facility].  Evaluation Performed:  Follow-up visit  This visit type was conducted due to national recommendations for restrictions regarding the COVID-19 Pandemic (e.g. social distancing).  This format is felt to be most appropriate for this patient at this time.  All issues noted in this document were discussed and addressed.  No physical exam was performed (except for noted visual exam findings with Video Visits).  Please refer to the patient's chart (MyChart message for video visits and phone note for telephone visits) for the patient's consent to telehealth for [facility]  Date:  02/25/2021   ID:  Rachael Davis, DOB [birth year], MRN [id number]  Patient Location:  [address] [name] [address]   Provider location:     [facility] [address] Office [phone] Fax [phone]   PCP:  Deatra James, MD  Cardiologist:  Chilton Si, MD  Electrophysiologist:  None   Chief Complaint: Follow-up for ischemic cardiomyopathy  History of Present Illness:    Rachael Davis is a 65 y.o. female who presents via audio/video  conferencing for a telehealth visit today.  Patient verified DOB and address.  She has a PMH of ischemic cardiomyopathy EF 30-35%, first-degree AV block, coronary artery disease status post CABG x4 LIMA-LAD, SVG-RI, SVG-OM, SVG-RCA, mitral valve replacement 04/12/2017, chronic renal insufficiency stage III, essential hypertension, diabetes mellitus, paroxysmal atrial fibrillation on warfarin.  She was seen and evaluated by [name], PA-C on 02/09/2021.  During that time her 01/20/2021 labs were reviewed.  Her potassium was elevated at 5.7.  Her [name] was stopped and she was started on valsartan.  Dr. Duke Salvia was considering ordering TEE for further evaluation of her replace mitral valve.  During her visit she complained of dyspnea on exertion.  Her EKG showed normal sinus rhythm first-degree AV block PR interval 300 ms.  She is contacted virtually today and states she feels well today.  She reports that her breathing is better after discontinuing Entresto.  She reports she has a visit with the EP on Monday.  We discussed her cardiac event monitor results.  She reports she notices palpitations with stressful situations at work and in the evenings.  She reports she drinks coffee through the day, enjoys chocolate, and was told during her last lab draw that she was dehydrated.  We discussed having follow-up TEE.  She requested that I reach out to Dr. Duke Salvia because she had been told that she may not need to test.  I will order a BMP, have her increase her physical activity as tolerated, have her heart healthy low-sodium diet, have her avoid triggers for palpitations, and follow-up with Dr. Duke Salvia in 2-3 months.  Today she  denies chest pain, shortness of breath, lower extremity edema, increased fatigue.  The patient does not symptoms concerning for COVID-19 infection (fever, chills, cough, or new SHORTNESS OF BREATH).    Prior CV studies:   The following studies were reviewed today:  Echocardiogram  12/30/2020 IMPRESSIONS    1. Left ventricular ejection fraction, by estimation, is 30 to 35%. The  left ventricle has moderately decreased function. The left ventricle  demonstrates regional wall motion abnormalities (see scoring  diagram/findings for description). There is mild  left ventricular hypertrophy. Left ventricular diastolic function could  not be evaluated.  2. Right ventricular systolic function is moderately reduced. The right  ventricular size is mildly enlarged. There is mildly elevated pulmonary  artery systolic pressure. The estimated right ventricular systolic  pressure is 43.7 mmHg.  3. Abnormal prosthetic mitral valve, VTI ratio 2.8, peak velocity 2.3  m/s, PHT 77 msec, EOA 0.84 cm2. See Recommendations.. The mitral valve has  been repaired/replaced. Mild mitral valve regurgitation. The mean mitral  valve gradient is 8.0 mmHg. There  is a 25 mm [facility] Ease tissue valve present in the mitral position.  Procedure Date: 04/10/2017.  4. Tricuspid valve regurgitation is mild to moderate.  5. Small, mobile linear structure on the aorta aspect of the aortic  valve. Likely attached to coptation surface of right coronary cusp.  Present on imaging since TEE in 2018 and grossly unchanged. 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 See  Recommendations.. The aortic valve is grossly normal. Aortic valve  regurgitation is not visualized. No aortic stenosis is present.  6. The inferior vena cava is dilated in size with <50% respiratory  variability, suggesting right atrial pressure of 15 mmHg.   Conclusion(s)/Recommendation(s): Consider TEE for better assessment of  mitral valve prosthesis given abnormal prosthetic valve function of  undeteremined mechanism. TEE may also assist in characterization of mobile  aortic valve lesion.   Cardiac event monitor 02/16/2019  Patient had a min HR of 40 bpm, max HR of 190 bpm, and avg HR of 87 bpm. Predominant underlying  rhythm was Sinus Rhythm. First Degree AV Block was present. 7 Ventricular Tachycardia runs occurred, the run with the fastest interval lasting 5 beats with a  max rate of 190 bpm, the longest lasting 6 beats with an avg rate of 101 bpm. 3924 Supraventricular Tachycardia runs occurred, the run with the fastest interval lasting 5 mins 10 secs with a max rate of 169 bpm, the longest lasting 35 mins 42 secs with  an avg rate of 119 bpm. Idioventricular Rhythm was present. Second Degree AV Block-Mobitz I (Wenckebach) was present. Ventricular Tachycardia and Supraventricular Tachycardia were detected within +/- 45 seconds of symptomatic patient event(s). Isolated  SVEs were frequent (5.2%, 48436), SVE Couplets were rare (<1.0%, 4219), and SVE Triplets were occasional (1.4%, 4228). Isolated VEs were occasional (1.5%, 14043), VE Couplets were rare (<1.0%, 252), and VE Triplets were rare (<1.0%, 10). Ventricular  Trigeminy was present. Difficulty discerning atrial activity making definitive diagnosis difficult to ascertain.   Past Medical History:  Diagnosis Date  . Arrhythmia    Hx PAF, PVC's  . CHF (congestive heart failure) (HCC)   . Coronary artery disease    04/04/2017 STEMI  . Diabetes (HCC)   . Hyperlipidemia   . Hypertension   . New onset atrial fibrillation (HCC) 05/2017   Past Surgical History:  Procedure Laterality Date  . CORONARY ARTERY BYPASS GRAFT N/A 04/12/2017   Procedure: CORONARY ARTERY BYPASS GRAFTING (CABG),  ON PUMP, TIMES FOUR, USING LEFT INTERNAL MAMMARY ARTERY AND ENDOSCOPICALLY HARVESTED RIGHT GREATER SAPHENOUS VEIN;  Surgeon: Kerin Perna, MD;  Location: [facility] OR;  Service: Open Heart Surgery;  Laterality: N/A;  LIMA to LAD; SVG to OM; SVG to Ramus; SVG to Right  . IR THORACENTESIS ASP PLEURAL SPACE W/IMG GUIDE  05/25/2017  . LEFT HEART CATH AND CORONARY ANGIOGRAPHY N/A 04/04/2017   Procedure: Left Heart Cath and Coronary Angiography;  Surgeon: Peter M [name], MD;  Location: [facility]  INVASIVE CV LAB;  Service: Cardiovascular;  Laterality: N/A;  . MITRAL VALVE REPAIR N/A 04/12/2017   Procedure: MITRAL VALVE REPLACEMENT (MVR);  Surgeon: Donata Clay, Theron Arista, MD;  Location: [facility] OR;  Service: Open Heart Surgery;  Laterality: N/A;  Using a 39mm Magna Ease Mitral Pericardial Bioprosthesis  . TEE WITHOUT CARDIOVERSION N/A 04/12/2017   Procedure: TRANSESOPHAGEAL ECHOCARDIOGRAM (TEE);  Surgeon: Donata Clay, Theron Arista, MD;  Location: [facility] OR;  Service: Open Heart Surgery;  Laterality: N/A;  . TUBAL LIGATION       Current Meds  Medication Sig  . acetaminophen (TYLENOL) 325 MG tablet Take 2 tablets (650 mg total) by mouth every 6 (six) hours as needed for mild pain or headache.  Marland Kitchen aspirin EC 81 MG tablet Take 81 mg by mouth daily.  Marland Kitchen atorvastatin (LIPITOR) 40 MG tablet TAKE 1 TABLET (40 MG TOTAL) BY MOUTH DAILY AT 6 PM.  . carvedilol (COREG) 3.125 MG tablet Take 1 tablet (3.125 mg total) by mouth 2 (two) times daily.  . dapagliflozin propanediol (FARXIGA) 10 MG TABS tablet Take 10 mg by mouth daily.  . furosemide (LASIX) 80 MG tablet TAKE 1 TABLET (80 MG TOTAL) BY MOUTH 3 (THREE) TIMES A WEEK. ON MON, WED AND FRI.  Marland Kitchen glipiZIDE (GLUCOTROL XL) 10 MG 24 hr tablet Take 1 tablet (10 mg total) by mouth daily with breakfast.  . JANUVIA 100 MG tablet Take 100 mg by mouth daily. Patient has not started yet  . magnesium oxide (MAG-OX) 400 MG tablet TAKE 1 TABLET BY MOUTH EVERY DAY  . metFORMIN (GLUCOPHAGE) 1000 MG tablet Take 1,000 mg by mouth 2 (two) times daily with a meal.  . valsartan (DIOVAN) 40 MG tablet Take 1 tablet (40 mg total) by mouth daily.  Marland Kitchen warfarin (COUMADIN) 5 MG tablet TAKE 1 TO 1 AND 1/2 TABLETS BY MOUTH DAILY AS DIRECTED BY THE COUMADIN CLIN     Allergies:   Keflex [cephalexin] and Lisinopril   Social History   Tobacco Use  . Smoking status: Never Smoker  . Smokeless tobacco: Never Used  Vaping Use  . Vaping Use: Never used  Substance Use Topics  . Alcohol use: No  . Drug use:  No     Family Hx: The patient's family history includes Breast cancer in her maternal grandmother; Heart attack (age of onset: 35) in her father.  ROS:   Please see the history of present illness.     All other systems reviewed and are negative.   Labs/Other Tests and Data Reviewed:    Recent Labs: 12/09/2020: ALT 15 01/28/2021: BUN 24; Creatinine, Ser 1.27; Potassium 4.7; Sodium 141   Recent Lipid Panel Lab Results  Component Value Date/Time   CHOL 135 12/09/2020 09:52 AM   TRIG 50 12/09/2020 09:52 AM   HDL 56 12/09/2020 09:52 AM   CHOLHDL 2.4 12/09/2020 09:52 AM   CHOLHDL 3.5 04/06/2017 02:43 AM   LDLCALC 68 12/09/2020 09:52 AM    Wt Readings from Last 3  Encounters:  02/25/21 185 lb (83.9 kg)  02/09/21 185 lb (83.9 kg)  01/20/21 183 lb 6.4 oz (83.2 kg)     Exam:    Vital Signs:  Ht 5\' 3"  (1.6 m)   Wt 185 lb (83.9 kg)   BMI 32.77 kg/m    Well nourished, well developed female in no  acute distress.   ASSESSMENT & PLAN:    1.  Ischemic cardiomyopathy-no increased DOE or activity intolerance.  EF noted to be 30-35% on 12/30/2020 echocardiogram.  Potassium elevated on Entresto.  Entresto stopped and valsartan low-dose started.  TEE recommended for evaluation of mitral valve replacement. Continue valsartan, furosemide, carvedilol, Januvia Heart healthy low-sodium diet-salty 6 given Increase physical activity as tolerated Order TEE-review with Dr. Duke Salvia Repeat BMP  Palpitations-reports occasional brief episodes of palpitations.  She notices with increased stress and in the evenings.  Reports not staying well-hydrated, caffeine use, and enjoying chocolate. Continue carvedilol Avoid triggers caffeine, chocolate, EtOH, dehydration etc. Increase physical activity as tolerated  Coronary artery disease-status post CABG x4.  No chest pain today. Continue valsartan, aspirin, atorvastatin, carvedilol Heart healthy low-sodium diet-salty 6 given Increase physical activity  as tolerated  Essential hypertension-BP unavailable.  Well-controlled at last office visit. Continue valsartan Heart healthy low-sodium diet-salty 6 given Increase physical activity as tolerated  CKD stage III-creatinine 1.27 on 01/28/2021 Continue to monitor  Paroxysmal atrial fibrillation-reviewed cardiac event monitor. Continue warfarin, carvedilol Heart healthy low-sodium diet-salty 6 given Increase physical activity as tolerated Avoid triggers caffeine, chocolate, EtOH, dehydration etc.    COVID-19 Education: The signs and symptoms of COVID-19 were discussed with the patient and how to seek care for testing (follow up with PCP or arrange E-visit).  The importance of social distancing was discussed today.  Patient Risk:   After full review of this patients clinical status, I feel that they are at least moderate risk at this time.  Time:   Today, I have spent 12 minutes with the patient with telehealth technology discussing medication, palpitations, cardiac event monitor, TEE.     Medication Adjustments/Labs and Tests Ordered: Current medicines are reviewed at length with the patient today.  Concerns regarding medicines are outlined above.   Tests Ordered: No orders of the defined types were placed in this encounter.  Medication Changes: No orders of the defined types were placed in this encounter.   Disposition:  in 2 month(s)  Signed, Thomasene Ripple. Dantre Yearwood NP-C    07/08/2019 11:58 AM    [facility] [address] Office [phone] Fax [phone]

## 2021-02-25 ENCOUNTER — Telehealth: Payer: Self-pay

## 2021-02-25 ENCOUNTER — Telehealth (INDEPENDENT_AMBULATORY_CARE_PROVIDER_SITE_OTHER): Payer: No Typology Code available for payment source | Admitting: General Practice

## 2021-02-25 ENCOUNTER — Encounter: Payer: Self-pay | Admitting: General Practice

## 2021-02-25 VITALS — Ht 63.0 in | Wt 185.0 lb

## 2021-02-25 DIAGNOSIS — N183 Chronic kidney disease, stage 3 unspecified: Secondary | ICD-10-CM | POA: Diagnosis not present

## 2021-02-25 DIAGNOSIS — I10 Essential (primary) hypertension: Secondary | ICD-10-CM

## 2021-02-25 DIAGNOSIS — I480 Paroxysmal atrial fibrillation: Secondary | ICD-10-CM

## 2021-02-25 DIAGNOSIS — Z951 Presence of aortocoronary bypass graft: Secondary | ICD-10-CM | POA: Diagnosis not present

## 2021-02-25 DIAGNOSIS — I129 Hypertensive chronic kidney disease with stage 1 through stage 4 chronic kidney disease, or unspecified chronic kidney disease: Secondary | ICD-10-CM | POA: Diagnosis not present

## 2021-02-25 DIAGNOSIS — I255 Ischemic cardiomyopathy: Secondary | ICD-10-CM

## 2021-02-25 NOTE — Patient Instructions (Signed)
Medication Instructions:  Your physician recommends that you continue on your current medications as directed. Please refer to the Current Medication list given to you today.  *If you need a refill on your cardiac medications before your next appointment, please call your pharmacy*   Lab Work: BMET If you have labs (blood work) drawn today and your tests are completely normal, you will receive your results only by: Marland Kitchen MyChart Message (if you have MyChart) OR . A paper copy in the mail If you have any lab test that is abnormal or we need to change your treatment, we will call you to review the results.   Follow-Up: At [facility], you and your health needs are our priority.  As part of our continuing mission to provide you with exceptional heart care, we have created designated Provider Care Teams.  These Care Teams include your primary Cardiologist (physician) and Advanced Practice Providers (APPs -  Physician Assistants and Nurse Practitioners) who all work together to provide you with the care you need, when you need it.  We recommend signing up for the patient portal called "MyChart".  Sign up information is provided on this After Visit Summary.  MyChart is used to connect with patients for Virtual Visits (Telemedicine).  Patients are able to view lab/test results, encounter notes, upcoming appointments, etc.  Non-urgent messages can be sent to your provider as well.   To learn more about what you can do with MyChart, go to [email or web address].    Your next appointment:   2 month(s)  The format for your next appointment:   In Person  Provider:   You may see Chilton Si, MD or one of the following Advanced Practice Providers on your designated Care Team:    [place], PA-C  [name], FNP  Other Instructions 1) Increase physical activity as tolerated. 2) Avoid triggers for Palpitations such as caffeine, chocolate, alcohol and dehydration.  Low-Sodium Eating  Plan Sodium, which is an element that makes up salt, helps you maintain a healthy balance of fluids in your body. Too much sodium can increase your blood pressure and cause fluid and waste to be held in your body. Your health care provider or dietitian may recommend following this plan if you have high blood pressure (hypertension), kidney disease, liver disease, or heart failure. Eating less sodium can help lower your blood pressure, reduce swelling, and protect your heart, liver, and kidneys. What are tips for following this plan? Reading food labels  The Nutrition Facts label lists the amount of sodium in one serving of the food. If you eat more than one serving, you must multiply the listed amount of sodium by the number of servings.  Choose foods with less than 140 mg of sodium per serving.  Avoid foods with 300 mg of sodium or more per serving. Shopping  Look for lower-sodium products, often labeled as "low-sodium" or "no salt added."  Always check the sodium content, even if foods are labeled as "unsalted" or "no salt added."  Buy fresh foods. ? Avoid canned foods and pre-made or frozen meals. ? Avoid canned, cured, or processed meats.  Buy breads that have less than 80 mg of sodium per slice.   Cooking  Eat more home-cooked food and less restaurant, buffet, and fast food.  Avoid adding salt when cooking. Use salt-free seasonings or herbs instead of table salt or sea salt. Check with your health care provider or pharmacist before using salt substitutes.  Cook with plant-based oils,  such as canola, sunflower, or olive oil.   Meal planning  When eating at a restaurant, ask that your food be prepared with less salt or no salt, if possible. Avoid dishes labeled as brined, pickled, cured, smoked, or made with soy sauce, miso, or teriyaki sauce.  Avoid foods that contain MSG (monosodium glutamate). MSG is sometimes added to [place] food, bouillon, and some canned foods.  Make  meals that can be grilled, baked, poached, roasted, or steamed. These are generally made with less sodium. General information Most people on this plan should limit their sodium intake to 1,500-2,000 mg (milligrams) of sodium each day. What foods should I eat? Fruits Fresh, frozen, or canned fruit. Fruit juice. Vegetables Fresh or frozen vegetables. "No salt added" canned vegetables. "No salt added" tomato sauce and paste. Low-sodium or reduced-sodium tomato and vegetable juice. Grains Low-sodium cereals, including oats, puffed wheat and rice, and shredded wheat. Low-sodium crackers. Unsalted rice. Unsalted pasta. Low-sodium bread. Whole-grain breads and whole-grain pasta. Meats and other proteins Fresh or frozen (no salt added) meat, poultry, seafood, and fish. Low-sodium canned tuna and salmon. Unsalted nuts. Dried peas, beans, and lentils without added salt. Unsalted canned beans. Eggs. Unsalted nut butters. Dairy Milk. Soy milk. Cheese that is naturally low in sodium, such as ricotta cheese, fresh mozzarella, or Swiss cheese. Low-sodium or reduced-sodium cheese. Cream cheese. Yogurt. Seasonings and condiments Fresh and dried herbs and spices. Salt-free seasonings. Low-sodium mustard and ketchup. Sodium-free salad dressing. Sodium-free light mayonnaise. Fresh or refrigerated horseradish. Lemon juice. Vinegar. Other foods Homemade, reduced-sodium, or low-sodium soups. Unsalted popcorn and pretzels. Low-salt or salt-free chips. The items listed above may not be a complete list of foods and beverages you can eat. Contact a dietitian for more information. What foods should I avoid? Vegetables Sauerkraut, pickled vegetables, and relishes. Olives. [place] fries. Onion rings. Regular canned vegetables (not low-sodium or reduced-sodium). Regular canned tomato sauce and paste (not low-sodium or reduced-sodium). Regular tomato and vegetable juice (not low-sodium or reduced-sodium). Frozen vegetables in  sauces. Grains Instant hot cereals. Bread stuffing, pancake, and biscuit mixes. Croutons. Seasoned rice or pasta mixes. Noodle soup cups. Boxed or frozen macaroni and cheese. Regular salted crackers. Self-rising flour. Meats and other proteins Meat or fish that is salted, canned, smoked, spiced, or pickled. Precooked or cured meat, such as sausages or meat loaves. Tomasa Blase. Ham. Pepperoni. Hot dogs. Corned beef. Chipped beef. Salt pork. Jerky. Pickled herring. Anchovies and sardines. Regular canned tuna. Salted nuts. Dairy Processed cheese and cheese spreads. Hard cheeses. Cheese curds. Blue cheese. Feta cheese. String cheese. Regular cottage cheese. Buttermilk. Canned milk. Fats and oils Salted butter. Regular margarine. Ghee. Bacon fat. Seasonings and condiments Onion salt, garlic salt, seasoned salt, table salt, and sea salt. Canned and packaged gravies. Worcestershire sauce. Tartar sauce. Barbecue sauce. Teriyaki sauce. Soy sauce, including reduced-sodium. Steak sauce. Fish sauce. Oyster sauce. Cocktail sauce. Horseradish that you find on the shelf. Regular ketchup and mustard. Meat flavorings and tenderizers. Bouillon cubes. Hot sauce. Pre-made or packaged marinades. Pre-made or packaged taco seasonings. Relishes. Regular salad dressings. Salsa. Other foods Salted popcorn and pretzels. Corn chips and puffs. Potato and tortilla chips. Canned or dried soups. Pizza. Frozen entrees and pot pies. The items listed above may not be a complete list of foods and beverages you should avoid. Contact a dietitian for more information. Summary  Eating less sodium can help lower your blood pressure, reduce swelling, and protect your heart, liver, and kidneys.  Most people on this plan  should limit their sodium intake to 1,500-2,000 mg (milligrams) of sodium each day.  Canned, boxed, and frozen foods are high in sodium. Restaurant foods, fast foods, and pizza are also very high in sodium. You also get sodium  by adding salt to food.  Try to cook at home, eat more fresh fruits and vegetables, and eat less fast food and canned, processed, or prepared foods. This information is not intended to replace advice given to you by your health care provider. Make sure you discuss any questions you have with your health care provider. Document Revised: 12/26/2019 Document Reviewed: 10/22/2019 Elsevier Patient Education  2021 [employer].

## 2021-02-25 NOTE — Progress Notes (Signed)
The gradient across her valve is unchanged from prior.  If she is feeling well then I think we can defer it for now.  She was feeling short of breath when she saw Franky Macho but this seems to have improved after changing her medications so we will continue to monitor.

## 2021-02-25 NOTE — Addendum Note (Signed)
Addended by: Cleda Mccreedy on: 02/25/2021 11:00 AM   Modules accepted: Orders

## 2021-02-25 NOTE — Telephone Encounter (Signed)
PER [name], FNP-C Please call Ms. Rachael Davis and let her know that Dr. Duke Salvia and I have talked about her TEE. Because she is no longer symptomatic or having trouble with her breathing we will postpone at this time. Thank you. She still needs to go to her appointment with Dr. Elberta Fortis next week.  Left detailed message as above. CB if any questions/concerns

## 2021-02-25 NOTE — Addendum Note (Signed)
Addended by: Cleda Mccreedy on: 02/25/2021 10:57 AM   Modules accepted: Orders

## 2021-02-28 ENCOUNTER — Institutional Professional Consult (permissible substitution): Payer: No Typology Code available for payment source | Admitting: Cardiology

## 2021-02-28 LAB — BASIC METABOLIC PANEL
BUN/Creatinine Ratio: 20 (ref 12–28)
BUN: 28 mg/dL — ABNORMAL HIGH (ref 8–27)
CO2: 21 mmol/L (ref 20–29)
Calcium: 9.4 mg/dL (ref 8.7–10.3)
Chloride: 107 mmol/L — ABNORMAL HIGH (ref 96–106)
Creatinine, Ser: 1.42 mg/dL — ABNORMAL HIGH (ref 0.57–1.00)
Glucose: 74 mg/dL (ref 65–99)
Potassium: 5.3 mmol/L — ABNORMAL HIGH (ref 3.5–5.2)
Sodium: 144 mmol/L (ref 134–144)
eGFR: 41 mL/min/{1.73_m2} — ABNORMAL LOW (ref >59)

## 2021-02-28 NOTE — Progress Notes (Deleted)
Electrophysiology Office Note   Date:  02/28/2021   ID:  Myriah, [name] [birth year], MRN [id number]  PCP:  Deatra James, MD  Cardiologist:  Duke Salvia Primary Electrophysiologist:  Will Jorja Loa, MD    Chief Complaint: ***   History of Present Illness: Rachael Davis is a 65 y.o. female who is being seen today for the evaluation of *** at the request of Abelino Derrick, PA-C. Presenting today for electrophysiology evaluation.  She has a history of coronary artery disease status post STEMI in 2018, diabetes, hypertension, hyperlipidemia.  She has an ischemic cardiomyopathy with an ejection fraction of 30 to 35%.  She has a history of coronary artery disease status post CABG x4 as well as mitral valve replacement in 2018, CKD stage III, and paroxysmal atrial fibrillation.  Today, she denies*** symptoms of palpitations, chest pain, shortness of breath, orthopnea, PND, lower extremity edema, claudication, dizziness, presyncope, syncope, bleeding, or neurologic sequela. The patient is tolerating medications without difficulties.    Past Medical History:  Diagnosis Date  . Arrhythmia    Hx PAF, PVC's  . CHF (congestive heart failure) (HCC)   . Coronary artery disease    04/04/2017 STEMI  . Diabetes (HCC)   . Hyperlipidemia   . Hypertension   . New onset atrial fibrillation (HCC) 05/2017   Past Surgical History:  Procedure Laterality Date  . CORONARY ARTERY BYPASS GRAFT N/A 04/12/2017   Procedure: CORONARY ARTERY BYPASS GRAFTING (CABG), ON PUMP, TIMES FOUR, USING LEFT INTERNAL MAMMARY ARTERY AND ENDOSCOPICALLY HARVESTED RIGHT GREATER SAPHENOUS VEIN;  Surgeon: Kerin Perna, MD;  Location: [facility] OR;  Service: Open Heart Surgery;  Laterality: N/A;  LIMA to LAD; SVG to OM; SVG to Ramus; SVG to Right  . IR THORACENTESIS ASP PLEURAL SPACE W/IMG GUIDE  05/25/2017  . LEFT HEART CATH AND CORONARY ANGIOGRAPHY N/A 04/04/2017   Procedure: Left Heart Cath and Coronary Angiography;  Surgeon:  Peter M [name], MD;  Location: [facility] INVASIVE CV LAB;  Service: Cardiovascular;  Laterality: N/A;  . MITRAL VALVE REPAIR N/A 04/12/2017   Procedure: MITRAL VALVE REPLACEMENT (MVR);  Surgeon: Donata Clay, Theron Arista, MD;  Location: [facility] OR;  Service: Open Heart Surgery;  Laterality: N/A;  Using a 49mm Magna Ease Mitral Pericardial Bioprosthesis  . TEE WITHOUT CARDIOVERSION N/A 04/12/2017   Procedure: TRANSESOPHAGEAL ECHOCARDIOGRAM (TEE);  Surgeon: Donata Clay, Theron Arista, MD;  Location: [facility] OR;  Service: Open Heart Surgery;  Laterality: N/A;  . TUBAL LIGATION       Current Outpatient Medications  Medication Sig Dispense Refill  . acetaminophen (TYLENOL) 325 MG tablet Take 2 tablets (650 mg total) by mouth every 6 (six) hours as needed for mild pain or headache.    Marland Kitchen aspirin EC 81 MG tablet Take 81 mg by mouth daily.    Marland Kitchen atorvastatin (LIPITOR) 40 MG tablet TAKE 1 TABLET (40 MG TOTAL) BY MOUTH DAILY AT 6 PM. 90 tablet 3  . carvedilol (COREG) 3.125 MG tablet Take 1 tablet (3.125 mg total) by mouth 2 (two) times daily. 180 tablet 2  . dapagliflozin propanediol (FARXIGA) 10 MG TABS tablet Take 10 mg by mouth daily.    . furosemide (LASIX) 80 MG tablet TAKE 1 TABLET (80 MG TOTAL) BY MOUTH 3 (THREE) TIMES A WEEK. ON MON, WED AND FRI. 60 tablet 3  . glipiZIDE (GLUCOTROL XL) 10 MG 24 hr tablet Take 1 tablet (10 mg total) by mouth daily with breakfast.    . JANUVIA 100 MG tablet  Take 100 mg by mouth daily. Patient has not started yet    . magnesium oxide (MAG-OX) 400 MG tablet TAKE 1 TABLET BY MOUTH EVERY DAY 90 tablet 3  . metFORMIN (GLUCOPHAGE) 1000 MG tablet Take 1,000 mg by mouth 2 (two) times daily with a meal.    . valsartan (DIOVAN) 40 MG tablet Take 1 tablet (40 mg total) by mouth daily. 30 tablet 2  . warfarin (COUMADIN) 5 MG tablet TAKE 1 TO 1 AND 1/2 TABLETS BY MOUTH DAILY AS DIRECTED BY THE COUMADIN CLIN 120 tablet 1   No current facility-administered medications for this visit.    Allergies:   Keflex  [cephalexin] and Lisinopril   Social History:  The patient  reports that she has never smoked. She has never used smokeless tobacco. She reports that she does not drink alcohol and does not use drugs.   Family History:  The patient's family history includes Breast cancer in her maternal grandmother; Heart attack (age of onset: 68) in her father.    ROS:  Please see the history of present illness.   Otherwise, review of systems is positive for none.   All other systems are reviewed and negative.    PHYSICAL EXAM: VS:  There were no vitals taken for this visit. , BMI There is no height or weight on file to calculate BMI. GEN: Well nourished, well developed, in no acute distress  HEENT: normal  Neck: no JVD, carotid bruits, or masses Cardiac: ***RRR; no murmurs, rubs, or gallops,no edema  Respiratory:  clear to auscultation bilaterally, normal work of breathing GI: soft, nontender, nondistended, + BS MS: no deformity or atrophy  Skin: warm and dry Neuro:  Strength and sensation are intact Psych: euthymic mood, full affect  EKG:  EKG {ACTION; IS/IS EXH:[id number]} ordered today. Personal review of the ekg ordered *** shows ***  Recent Labs: 12/09/2020: ALT 15 01/28/2021: BUN 24; Creatinine, Ser 1.27; Potassium 4.7; Sodium 141    Lipid Panel     Component Value Date/Time   CHOL 135 12/09/2020 0952   TRIG 50 12/09/2020 0952   HDL 56 12/09/2020 0952   CHOLHDL 2.4 12/09/2020 0952   CHOLHDL 3.5 04/06/2017 0243   VLDL 13 04/06/2017 0243   LDLCALC 68 12/09/2020 0952     Wt Readings from Last 3 Encounters:  02/25/21 185 lb (83.9 kg)  02/09/21 185 lb (83.9 kg)  01/20/21 183 lb 6.4 oz (83.2 kg)      Other studies Reviewed: Additional studies/ records that were reviewed today include: TTE 12/30/20  Review of the above records today demonstrates:  1. Left ventricular ejection fraction, by estimation, is 30 to 35%. The  left ventricle has moderately decreased function. The left  ventricle  demonstrates regional wall motion abnormalities (see scoring  diagram/findings for description). There is mild  left ventricular hypertrophy. Left ventricular diastolic function could  not be evaluated.  2. Right ventricular systolic function is moderately reduced. The right  ventricular size is mildly enlarged. There is mildly elevated pulmonary  artery systolic pressure. The estimated right ventricular systolic  pressure is 43.7 mmHg.  3. Abnormal prosthetic mitral valve, VTI ratio 2.8, peak velocity 2.3  m/s, PHT 77 msec, EOA 0.84 cm2. See Recommendations.. The mitral valve has  been repaired/replaced. Mild mitral valve regurgitation. The mean mitral  valve gradient is 8.0 mmHg. There  is a 25 mm [facility] Ease tissue valve present in the mitral position.  Procedure Date: 04/10/2017.  4. Tricuspid valve regurgitation  is mild to moderate.  5. Small, mobile linear structure on the aorta aspect of the aortic  valve. Likely attached to coptation surface of right coronary cusp.  Present on imaging since TEE in 2018 and grossly unchanged. 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 See  Recommendations.. The aortic valve is grossly normal. Aortic valve  regurgitation is not visualized. No aortic stenosis is present.  6. The inferior vena cava is dilated in size with <50% respiratory  variability, suggesting right atrial pressure of 15 mmHg.   Monitor 02/25/21 Predominant rhythm: sinus rhythm with first degree AV block Average heart rate: 86 bpm Max heart rate: 96 bpm Min heart rate: 52 bpm Pauses >2.5 seconds: none Atrial ectopics: frequent (5.2% isolated, <1% couplets, 1.4% triplets)  Ventricular ectopics 1.5%    ASSESSMENT AND PLAN:  1.  Ischemic cardiomyopathy: Ejection fraction 30 to 35%.  Was previously 35 to 40%.  Currently on valsartan, Lasix, carvedilol, Januvia.***  2.  Coronary artery disease: Status post CABG x4.  No current chest pain.  Plan per  primary cardiology.  3.  Paroxysmal atrial fibrillation: Currently on warfarin and carvedilol.  CHA2DS2-VASc of 6.  Current medicines are reviewed at length with the patient today.   The patient {ACTIONS; HAS/DOES NOT HAVE:19233} concerns regarding her medicines.  The following changes were made today:  {NONE DEFAULTED:18576::"none"}  Labs/ tests ordered today include: *** No orders of the defined types were placed in this encounter.    Disposition:   FU with Will Camnitz {gen number [id number]} {Days to years:10300}  Signed, Will Jorja Loa, MD  02/28/2021 9:25 AM     [facility] [address] [place] [address] [phone] (office) [phone] (fax)

## 2021-03-01 ENCOUNTER — Other Ambulatory Visit: Payer: Self-pay

## 2021-03-01 MED ORDER — VALSARTAN 40 MG PO TABS: 20.0000 mg | ORAL_TABLET | Freq: Every day | ORAL | 2 refills | Status: DC

## 2021-03-28 ENCOUNTER — Other Ambulatory Visit: Payer: Self-pay

## 2021-03-28 ENCOUNTER — Ambulatory Visit (INDEPENDENT_AMBULATORY_CARE_PROVIDER_SITE_OTHER): Payer: No Typology Code available for payment source

## 2021-03-28 DIAGNOSIS — Z5181 Encounter for therapeutic drug level monitoring: Secondary | ICD-10-CM | POA: Diagnosis not present

## 2021-03-28 DIAGNOSIS — I480 Paroxysmal atrial fibrillation: Secondary | ICD-10-CM | POA: Diagnosis not present

## 2021-03-28 DIAGNOSIS — Z951 Presence of aortocoronary bypass graft: Secondary | ICD-10-CM

## 2021-03-28 DIAGNOSIS — Z7901 Long term (current) use of anticoagulants: Secondary | ICD-10-CM

## 2021-03-28 LAB — POCT INR: INR: 2.3 (ref 2.0–3.0)

## 2021-03-28 NOTE — Patient Instructions (Signed)
Continue with 1 tablet daily except 1.5 tablets each Friday.  Repeat INR in 6  weeks

## 2021-04-01 NOTE — Telephone Encounter (Signed)
Pt cancelled Dr Elberta Fortis appt per note:(does not need this consult [facility] Salvia spoke)

## 2021-04-29 ENCOUNTER — Other Ambulatory Visit: Payer: Self-pay

## 2021-04-29 ENCOUNTER — Ambulatory Visit (INDEPENDENT_AMBULATORY_CARE_PROVIDER_SITE_OTHER): Payer: No Typology Code available for payment source

## 2021-04-29 DIAGNOSIS — Z5181 Encounter for therapeutic drug level monitoring: Secondary | ICD-10-CM | POA: Diagnosis not present

## 2021-04-29 DIAGNOSIS — Z951 Presence of aortocoronary bypass graft: Secondary | ICD-10-CM | POA: Diagnosis not present

## 2021-04-29 DIAGNOSIS — I480 Paroxysmal atrial fibrillation: Secondary | ICD-10-CM | POA: Diagnosis not present

## 2021-04-29 DIAGNOSIS — Z7901 Long term (current) use of anticoagulants: Secondary | ICD-10-CM

## 2021-04-29 LAB — POCT INR: INR: 1.4 — AB (ref 2.0–3.0)

## 2021-04-29 NOTE — Patient Instructions (Signed)
Take 2.5 tablets tonight only and then Continue with 1 tablet daily except 1.5 tablets each Friday.  Repeat INR in 4 weeks. [id number]

## 2021-05-28 ENCOUNTER — Ambulatory Visit (HOSPITAL_COMMUNITY)
Admission: EM | Admit: 2021-05-28 | Discharge: 2021-05-28 | Disposition: A | Discharge status: Home / Self Care | Payer: No Typology Code available for payment source | Attending: Medical Oncology | Admitting: Medical Oncology

## 2021-05-28 ENCOUNTER — Encounter (HOSPITAL_COMMUNITY): Payer: Self-pay

## 2021-05-28 DIAGNOSIS — R058 Other specified cough: Secondary | ICD-10-CM

## 2021-05-28 DIAGNOSIS — H66003 Acute suppurative otitis media without spontaneous rupture of ear drum, bilateral: Secondary | ICD-10-CM | POA: Diagnosis not present

## 2021-05-28 HISTORY — DX: Acute myocardial infarction, unspecified: I21.9

## 2021-05-28 MED ORDER — AMOXICILLIN-POT CLAVULANATE 875-125 MG PO TABS: 1.0000 | ORAL_TABLET | Freq: Two times a day (BID) | ORAL | 0 refills | Status: DC

## 2021-05-28 MED ORDER — BENZONATATE 100 MG PO CAPS: 100.0000 mg | ORAL_CAPSULE | Freq: Three times a day (TID) | ORAL | 0 refills | Status: DC

## 2021-05-28 MED ORDER — FLUTICASONE PROPIONATE 50 MCG/ACT NA SUSP: 2.0000 | Freq: Every day | NASAL | 0 refills | Status: DC

## 2021-05-28 NOTE — ED Triage Notes (Addendum)
Pt reports congestion and cough for about 3 weeks, feeling that left ear is stopped up x1 week, with sharp pain in left side starting this morning that pt feels when coughing or taking a deep breath. Reports so much mucus as to make it hard to breathe at times, trouble getting up all of it.

## 2021-05-28 NOTE — ED Provider Notes (Addendum)
[facility]    CSN: [id number] Arrival date & time: 05/28/21  1029      History   Chief Complaint Chief Complaint  Patient presents with   Nasal Congestion   Cough   Left Side Pain   Ear Fullness    HPI Rachael Davis is a 65 y.o. female.   HPI  Cold Symptoms: Patient reports that for the past 3 weeks she has had cold symptoms of nasal congestion, cough.  She reports that a few days ago both her ear started to become clogged and started to give her pain.  In addition her cough has worsened slightly and she is now having some pain when she takes deep deep breaths or she has coughing fits.  She does have a dry and productive cough with a white mucus.  No hemoptysis, no fever, no chest pain, no shortness of breath or wheezing.  No history of asthma or COPD.  She has not been using anything for symptoms.  No recent new sick contacts.  Past Medical History:  Diagnosis Date   Arrhythmia    Hx PAF, PVC's   CHF (congestive heart failure) (HCC)    Coronary artery disease    04/04/2017 STEMI   Diabetes (HCC)    Heart attack (HCC)    per pt reprot   Hyperlipidemia    Hypertension    New onset atrial fibrillation (HCC) 05/2017    Patient Active Problem List   Diagnosis Date Noted   Hyperkalemia 02/09/2021   Palpitations 01/20/2021   Ischemic cardiomyopathy 01/20/2021   1st degree AV block 01/20/2021   CRI (chronic renal insufficiency), stage 3 (moderate) (HCC) 01/20/2021   Multiple thyroid nodules 08/25/2019   PAF (paroxysmal atrial fibrillation) (HCC) 07/03/2017   Systolic and diastolic CHF, acute on chronic (HCC) 05/23/2017   Volume overload 05/08/2017   Long term (current) use of anticoagulants [Z79.01] 04/23/2017   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   Essential hypertension 04/04/2017   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) 04/04/2017   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    Past Surgical History:  Procedure Laterality Date    CORONARY ARTERY BYPASS GRAFT N/A 04/12/2017   Procedure: CORONARY ARTERY BYPASS GRAFTING (CABG), ON PUMP, TIMES FOUR, USING LEFT INTERNAL MAMMARY ARTERY AND ENDOSCOPICALLY HARVESTED RIGHT GREATER SAPHENOUS VEIN;  Surgeon: Kerin Perna, MD;  Location: [facility] OR;  Service: Open Heart Surgery;  Laterality: N/A;  LIMA to LAD; SVG to OM; SVG to Ramus; SVG to Right   IR THORACENTESIS ASP PLEURAL SPACE W/IMG GUIDE  05/25/2017   LEFT HEART CATH AND CORONARY ANGIOGRAPHY N/A 04/04/2017   Procedure: Left Heart Cath and Coronary Angiography;  Surgeon: Peter M [name], MD;  Location: [facility] INVASIVE CV LAB;  Service: Cardiovascular;  Laterality: N/A;   MITRAL VALVE REPAIR N/A 04/12/2017   Procedure: MITRAL VALVE REPLACEMENT (MVR);  Surgeon: Donata Clay, Theron Arista, MD;  Location: [facility] OR;  Service: Open Heart Surgery;  Laterality: N/A;  Using a 29mm Magna Ease Mitral Pericardial Bioprosthesis   TEE WITHOUT CARDIOVERSION N/A 04/12/2017   Procedure: TRANSESOPHAGEAL ECHOCARDIOGRAM (TEE);  Surgeon: Donata Clay, Theron Arista, MD;  Location: [facility] OR;  Service: Open Heart Surgery;  Laterality: N/A;   TUBAL LIGATION      OB History   No obstetric history on file.      Home Medications    Prior to Admission medications   Medication Sig Start Date End Date Taking? Authorizing Provider  acetaminophen (TYLENOL) 325 MG tablet  Take 2 tablets (650 mg total) by mouth every 6 (six) hours as needed for mild pain or headache. 05/28/17  Yes Doree Fudge M, PA-C  amoxicillin-clavulanate (AUGMENTIN) 875-125 MG tablet Take 1 tablet by mouth every 12 (twelve) hours. 05/28/21  Yes Rushie Chestnut, PA-C  aspirin EC 81 MG tablet Take 81 mg by mouth daily.   Yes [provider]  atorvastatin (LIPITOR) 40 MG tablet TAKE 1 TABLET (40 MG TOTAL) BY MOUTH DAILY AT 6 PM. 10/01/20  Yes Chilton Si, MD  benzonatate (TESSALON) 100 MG capsule Take 1 capsule (100 mg total) by mouth every 8 (eight) hours. 05/28/21  Yes Greggory Safranek, Maralyn Sago M, PA-C   carvedilol (COREG) 3.125 MG tablet Take 1 tablet (3.125 mg total) by mouth 2 (two) times daily. 09/10/20 05/28/21 Yes Chilton Si, MD  dapagliflozin propanediol (FARXIGA) 10 MG TABS tablet Take 10 mg by mouth daily.   Yes [provider]  fluticasone (FLONASE) 50 MCG/ACT nasal spray Place 2 sprays into both nostrils daily. 05/28/21  Yes Audrinna Sherman M, PA-C  furosemide (LASIX) 80 MG tablet TAKE 1 TABLET (80 MG TOTAL) BY MOUTH 3 (THREE) TIMES A WEEK. ON MON, WED AND FRI. 11/05/20  Yes Chilton Si, MD  glipiZIDE (GLUCOTROL XL) 10 MG 24 hr tablet Take 1 tablet (10 mg total) by mouth daily with breakfast. 05/28/17  Yes Joycelyn Man, Donielle M, PA-C  JANUVIA 100 MG tablet Take 100 mg by mouth daily. Patient has not started yet 03/14/19  Yes [provider]  magnesium oxide (MAG-OX) 400 MG tablet TAKE 1 TABLET BY MOUTH EVERY DAY 10/01/20  Yes Chilton Si, MD  metFORMIN (GLUCOPHAGE) 1000 MG tablet Take 1,000 mg by mouth 2 (two) times daily with a meal.   Yes [provider]  valsartan (DIOVAN) 40 MG tablet Take 0.5 tablets (20 mg total) by mouth daily. 03/01/21  Yes Ronney Asters, NP  warfarin (COUMADIN) 5 MG tablet TAKE 1 TO 1 AND 1/2 TABLETS BY MOUTH DAILY AS DIRECTED BY THE COUMADIN CLIN 11/30/20  Yes Chilton Si, MD    Family History Family History  Problem Relation Age of Onset   Heart attack Father 49   Breast cancer Maternal Grandmother     Social History Social History   Tobacco Use   Smoking status: Never   Smokeless tobacco: Never  Vaping Use   Vaping Use: Never used  Substance Use Topics   Alcohol use: No   Drug use: No     Allergies   Keflex [cephalexin] and Lisinopril   Review of Systems Review of Systems  As stated above in HPI Physical Exam Triage Vital Signs ED Triage Vitals  Enc Vitals Group     BP 05/28/21 1110 133/90     Pulse Rate 05/28/21 1110 68     Resp 05/28/21 1110 18     Temp 05/28/21 1110 98.2 F  (36.8 C)     Temp Source 05/28/21 1110 Oral     SpO2 05/28/21 1110 99 %     Weight --      Height --      Head Circumference --      Peak Flow --      Pain Score 05/28/21 1106 0     Pain Loc --      Pain Edu? --      Excl. in GC? --    No data found.  Updated Vital Signs BP 133/90 (BP Location: Right Arm)   Pulse 68  Temp 98.2 F (36.8 C) (Oral)   Resp 18   SpO2 99%   Physical Exam Vitals and nursing note reviewed.  Constitutional:      General: She is not in acute distress.    Appearance: Normal appearance. She is not ill-appearing, toxic-appearing or diaphoretic.  HENT:     Head: Normocephalic and atraumatic.     Ears:     Comments: TMs intact with erythema and bulging bilaterally    Nose: Rhinorrhea present.     Mouth/Throat:     Mouth: Mucous membranes are moist.     Pharynx: Oropharynx is clear. No oropharyngeal exudate or posterior oropharyngeal erythema.  Eyes:     Extraocular Movements: Extraocular movements intact.     Pupils: Pupils are equal, round, and reactive to light.  Cardiovascular:     Rate and Rhythm: Normal rate and regular rhythm.     Heart sounds: Normal heart sounds.  Pulmonary:     Effort: Pulmonary effort is normal.     Breath sounds: Normal breath sounds.  Musculoskeletal:     Cervical back: Normal range of motion and neck supple.  Skin:    General: Skin is warm.  Neurological:     Mental Status: She is alert and oriented to person, place, and time.  Psychiatric:        Mood and Affect: Mood normal.        Behavior: Behavior normal.        Thought Content: Thought content normal.        Judgment: Judgment normal.     UC Treatments / Results  Labs (all labs ordered are listed, but only abnormal results are displayed) Labs Reviewed - No data to display  EKG   Radiology No results found.  Procedures Procedures (including critical care time)  Medications Ordered in UC Medications - No data to display  Initial  Impression / Assessment and Plan / UC Course  I have reviewed the triage vital signs and the nursing notes.  Pertinent labs & imaging results that were available during my care of the patient were reviewed by me and considered in my medical decision making (see chart for details).     New.  Treating for her bilateral acute otitis media to prevent further systemic illness with Augmentin as this will also cover for any potential pneumonia although the risk is lower given that she is afebrile and her lung examination today is normal.  Since we are going to start her on antibiotic anyways and her examination is normal we are going to forego chest x-ray at this time as it would not change our management.  Discussed red flag signs and symptoms along with how to take the medication. Follow up with PCP in 1 week or sooner as needed.  Final Clinical Impressions(s) / UC Diagnoses   Final diagnoses:  Productive cough  Non-recurrent acute suppurative otitis media of both ears without spontaneous rupture of tympanic membranes   Discharge Instructions   None    ED Prescriptions     Medication Sig Dispense Auth. Provider   amoxicillin-clavulanate (AUGMENTIN) 875-125 MG tablet Take 1 tablet by mouth every 12 (twelve) hours. 14 tablet Areyanna Figeroa M, PA-C   fluticasone [facility]) 50 MCG/ACT nasal spray Place 2 sprays into both nostrils daily. 16 mL Clarence Dunsmore M, PA-C   benzonatate (TESSALON) 100 MG capsule Take 1 capsule (100 mg total) by mouth every 8 (eight) hours. 21 capsule [place], [place]  PDMP not reviewed this encounter.   Rushie Chestnut, PA-C 05/28/21 1145    Rushie Chestnut, [place] 05/28/21 1146

## 2021-05-31 ENCOUNTER — Other Ambulatory Visit: Payer: Self-pay | Admitting: Cardiovascular Disease

## 2021-05-31 DIAGNOSIS — Z7901 Long term (current) use of anticoagulants: Secondary | ICD-10-CM

## 2021-06-13 ENCOUNTER — Other Ambulatory Visit: Payer: Self-pay | Admitting: Cardiovascular Disease

## 2021-06-17 ENCOUNTER — Ambulatory Visit (HOSPITAL_BASED_OUTPATIENT_CLINIC_OR_DEPARTMENT_OTHER): Payer: No Typology Code available for payment source | Admitting: Cardiovascular Disease

## 2021-06-20 ENCOUNTER — Other Ambulatory Visit: Payer: Self-pay

## 2021-06-20 ENCOUNTER — Ambulatory Visit (INDEPENDENT_AMBULATORY_CARE_PROVIDER_SITE_OTHER): Payer: No Typology Code available for payment source

## 2021-06-20 DIAGNOSIS — Z5181 Encounter for therapeutic drug level monitoring: Secondary | ICD-10-CM

## 2021-06-20 DIAGNOSIS — Z951 Presence of aortocoronary bypass graft: Secondary | ICD-10-CM

## 2021-06-20 DIAGNOSIS — I480 Paroxysmal atrial fibrillation: Secondary | ICD-10-CM

## 2021-06-20 DIAGNOSIS — Z7901 Long term (current) use of anticoagulants: Secondary | ICD-10-CM

## 2021-06-20 LAB — POCT INR: INR: 2.5 (ref 2.0–3.0)

## 2021-06-20 NOTE — Patient Instructions (Signed)
Continue with 1 tablet daily except 1.5 tablets each Friday.  Repeat INR in 6 weeks. [phone]

## 2021-07-21 ENCOUNTER — Ambulatory Visit (HOSPITAL_BASED_OUTPATIENT_CLINIC_OR_DEPARTMENT_OTHER): Payer: No Typology Code available for payment source | Admitting: Cardiovascular Disease

## 2021-07-29 ENCOUNTER — Other Ambulatory Visit: Payer: Self-pay | Admitting: Cardiovascular Disease

## 2021-07-29 NOTE — Telephone Encounter (Signed)
Rx request sent to pharmacy.  

## 2021-08-01 ENCOUNTER — Other Ambulatory Visit: Payer: Self-pay

## 2021-08-01 ENCOUNTER — Ambulatory Visit (INDEPENDENT_AMBULATORY_CARE_PROVIDER_SITE_OTHER): Payer: No Typology Code available for payment source

## 2021-08-01 DIAGNOSIS — Z951 Presence of aortocoronary bypass graft: Secondary | ICD-10-CM

## 2021-08-01 DIAGNOSIS — I480 Paroxysmal atrial fibrillation: Secondary | ICD-10-CM | POA: Diagnosis not present

## 2021-08-01 DIAGNOSIS — Z7901 Long term (current) use of anticoagulants: Secondary | ICD-10-CM | POA: Diagnosis not present

## 2021-08-01 DIAGNOSIS — Z5181 Encounter for therapeutic drug level monitoring: Secondary | ICD-10-CM

## 2021-08-01 LAB — POCT INR: INR: 4.4 — AB (ref 2.0–3.0)

## 2021-08-01 NOTE — Patient Instructions (Signed)
Hold today and tomorrow and then  Continue with 1 tablet daily except 1.5 tablets each Friday.  Repeat INR in 2 weeks per pt. [phone]

## 2021-08-15 ENCOUNTER — Ambulatory Visit (INDEPENDENT_AMBULATORY_CARE_PROVIDER_SITE_OTHER): Payer: No Typology Code available for payment source

## 2021-08-15 ENCOUNTER — Other Ambulatory Visit: Payer: Self-pay

## 2021-08-15 DIAGNOSIS — Z951 Presence of aortocoronary bypass graft: Secondary | ICD-10-CM | POA: Diagnosis not present

## 2021-08-15 DIAGNOSIS — Z7901 Long term (current) use of anticoagulants: Secondary | ICD-10-CM

## 2021-08-15 DIAGNOSIS — Z5181 Encounter for therapeutic drug level monitoring: Secondary | ICD-10-CM

## 2021-08-15 DIAGNOSIS — I480 Paroxysmal atrial fibrillation: Secondary | ICD-10-CM | POA: Diagnosis not present

## 2021-08-15 LAB — POCT INR: INR: 2.2 (ref 2.0–3.0)

## 2021-08-15 NOTE — Patient Instructions (Signed)
Take 1.5 tablets tonight only and then Continue with 1 tablet daily except 1.5 tablets each Friday.  Repeat INR in 6 weeks per pt. [phone]

## 2021-09-01 ENCOUNTER — Other Ambulatory Visit: Payer: Self-pay | Admitting: Cardiovascular Disease

## 2021-09-01 DIAGNOSIS — Z7901 Long term (current) use of anticoagulants: Secondary | ICD-10-CM

## 2021-09-21 ENCOUNTER — Ambulatory Visit (INDEPENDENT_AMBULATORY_CARE_PROVIDER_SITE_OTHER): Payer: No Typology Code available for payment source | Admitting: Cardiovascular Disease

## 2021-09-21 ENCOUNTER — Other Ambulatory Visit: Payer: Self-pay

## 2021-09-21 ENCOUNTER — Encounter (HOSPITAL_BASED_OUTPATIENT_CLINIC_OR_DEPARTMENT_OTHER): Payer: Self-pay | Admitting: Cardiovascular Disease

## 2021-09-21 VITALS — BP 130/90 | HR 88 | Ht 63.0 in | Wt 179.3 lb

## 2021-09-21 DIAGNOSIS — Z951 Presence of aortocoronary bypass graft: Secondary | ICD-10-CM | POA: Diagnosis not present

## 2021-09-21 DIAGNOSIS — R0602 Shortness of breath: Secondary | ICD-10-CM | POA: Diagnosis not present

## 2021-09-21 DIAGNOSIS — Z952 Presence of prosthetic heart valve: Secondary | ICD-10-CM

## 2021-09-21 DIAGNOSIS — I10 Essential (primary) hypertension: Secondary | ICD-10-CM

## 2021-09-21 DIAGNOSIS — I5043 Acute on chronic combined systolic (congestive) and diastolic (congestive) heart failure: Secondary | ICD-10-CM | POA: Diagnosis not present

## 2021-09-21 HISTORY — DX: Presence of prosthetic heart valve: Z95.2

## 2021-09-21 MED ORDER — FUROSEMIDE 80 MG PO TABS: 80.0000 mg | ORAL_TABLET | ORAL | 3 refills | Status: DC

## 2021-09-21 NOTE — Assessment & Plan Note (Signed)
She had a bioprosthetic mitral valve replacement 04/2017.  Mean gradient has been 8 mmHg.  She is volume overloaded on exam today unclear whether this is due to progressive valvular heart disease or acute on chronic systolic diastolic heart failure.  Increasing Lasix for now.  We will need to repeat her echo at the 1 year mark from her prior to assess for stability of her valve.  Consider TEE if there is progressive stenosis.

## 2021-09-21 NOTE — Patient Instructions (Signed)
Medication Instructions:  INCREASE YOUR FUROSEMIDE TO DAILY THROUGH Sunday AND THEN RESUME THREE TIMES A WEEK   *If you need a refill on your cardiac medications before your next appointment, please call your pharmacy*  Lab Work: BNP/BMET NEXT WEEK   If you have labs (blood work) drawn today and your tests are completely normal, you will receive your results only by: MyChart Message (if you have MyChart) OR A paper copy in the mail If you have any lab test that is abnormal or we need to change your treatment, we will call you to review the results.   Testing/Procedures: NONE   Follow-Up: At [facility], you and your health needs are our priority.  As part of our continuing mission to provide you with exceptional heart care, we have created designated Provider Care Teams.  These Care Teams include your primary Cardiologist (physician) and Advanced Practice Providers (APPs -  Physician Assistants and Nurse Practitioners) who all work together to provide you with the care you need, when you need it.  We recommend signing up for the patient portal called "MyChart".  Sign up information is provided on this After Visit Summary.  MyChart is used to connect with patients for Virtual Visits (Telemedicine).  Patients are able to view lab/test results, encounter notes, upcoming appointments, etc.  Non-urgent messages can be sent to your provider as well.   To learn more about what you can do with MyChart, go to [email or web address].    Your next appointment:   2-4 week(s)  The format for your next appointment:   In Person  Provider:   Chilton Si, MD or Gillian Shields, NP

## 2021-09-21 NOTE — Assessment & Plan Note (Addendum)
Today she is volume overloaded.  She reports shortness of breath orthopnea and chest discomfort.  She has a hard time taking Lasix every day because of working.  For now, she will increase it to daily through Sunday.  We will check a BMP and a BNP next week.  LVEF has been slowly declining.  She did not tolerate Entresto.  Continue carvedilol, losartan, and Farxiga.  Consider adding spironolactone, but her with her renal function I would not do that at this time.  We will need to consider ICD if her LVEF remains less than 35%.

## 2021-09-21 NOTE — Assessment & Plan Note (Signed)
She has occasional sharp chest pain that does not seem ischemic.  Continue aspirin, atorvastatin, and carvedilol.

## 2021-09-21 NOTE — Progress Notes (Signed)
Cardiology Office Note   Date:  09/21/2021   ID:  Rachael Davis 06-Feb-1956, MRN [id number]  PCP:  Deatra James, MD  Cardiologist:   Chilton Si, MD  Heart Failure: Rachael Davis  No chief complaint on file.    History of Present Illness: Rachael Davis is a 65 y.o. 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  Rachael Davis presented 04/2017 with STEMI.  Left heart catheterization revealed 100% left circumflex, 95% proximal RCA, 90% ramus intermedius, and 65% LAD.   LVEF was 35-45%.   Her echo 04/05/17 revealed LVEF 45% with lateral hypokinesis and mild MR.  She underwent four-vessel CABG (LIMA-->LAD, SVG-->RI, SVG-->OM, SVG-->RCA) on 04/16/17. In the OR she was also noted to have moderate to severe mitral regurgitation so she also underwent replacement of the mitral valve with a 25 mm [facility] Ease bioprosthetic valve.  During the hospitalization she required diuresis for acute systolic and diastolic heart failure.  She also had a brief episode of atrial fibrillation that resolved with metoprolol.  She saw Rachael Davis in clinic 05/08/17 and was volume overloaded.  Lasix and metoprolol were increased.  She followed up with Rachael Davis 05/23/17 and was found to be in atrial fibrillation.  She had a repeat echocardiogram 05/24/17 that revealed LVEF 35-40% with akinesis of the inferolateral myocardium and basal inferior myocardium. Her bioprosthetic mitral valve was functioning well. There was a small mobile target on the prosthesis that was thought to be residual native valve.  She was admitted and the heart failure team was consulted.  She spontaneously converted to sinus tachycardia. Metoprolol was discontinued 2/2 low output and she was started on amiodarone.  She underwent L thoracentesis.  Her discharge weight  was 177 pounds. She was started on carvedilol 3.125mg  bid.  She wore a 48 hour Holter 10/2017 that showed 3% PVCs.  Rachael Davis had a repeat echo 02/2018 that revealed LVEF 35-40% with a mean gradient across the mitral valve of 15 mmHg.  The gradient decreased to 8 mmHg on subsequent echo in 2020.  She reported palpitations and wore an ambulatory monitor 03/2018 that showed PVCs but no atrial fibrillation.  She last saw Rachael Davis 05/2019 and was stable.  She was started on carvedilol due to frequent PVCs.  In the past she had PR prolongation on higher doses of carvedilol and on metoprolol.  Atorvastatin was increased 10/14/2019.  In May 2021 she had an episode of shortness of breath.  She came for her coumadin check and mentioned it.  Lasix was increased to 80 mg and her symptoms improved.   She had a repeat echo 12/2020 that revealed LVEF 30-35%. PASP was . Mean gradient on bioprosthetic mitral valve was 8. RA pressure was 15.  She followed up with Rachael Fabian NP 02/2021 and was feeling well. She noted that she felt better since discontinuing Entresto. She reported frequent palpitations. She wore a 7 day monitor that showed up 6 beat runs of NSVT and frequent episodes of SVT lasting up to 5 minutes.  Today, she is doing good overall. She has been experiencing shortness of breath since around 02/2021. This is around the same time she stopped Entresto. However, she does not associate stopping the medicine to her SOB episodes. Doing activities like walking down her driveway can make her short of breath. She also  reports palpitations that occur randomly and feel similar to her atrial fibrillation episodes. They occur everyday and last for a couple minutes. She feels them primarily when she's lying down or sitting. She also experiences an occasional pinching pain in her L chest and down her L side. She reported a muscle cramp on 10/17 going down her entire L side which may be related. For 3-4 weeks, she has  noticed swelling in her bilateral feet. In the morning, her feet are normal but they get more swollen in the evening. She does not record her blood pressure at home. She is also watching her salt, sugar, and fried food intake. She does not put additional salt in food. She denies any lightheadedness, headaches, syncope, orthopnea, or PND.   Past Medical History:  Diagnosis Date   Arrhythmia    Hx PAF, PVC's   CHF (congestive heart failure) (HCC)    Coronary artery disease    04/04/2017 STEMI   Diabetes [facility])    H/O mitral valve replacement 09/21/2021   Heart attack (HCC)    per pt reprot   Hyperlipidemia    Hypertension    New onset atrial fibrillation (HCC) 05/2017    Past Surgical History:  Procedure Laterality Date   CORONARY ARTERY BYPASS GRAFT N/A 04/12/2017   Procedure: CORONARY ARTERY BYPASS GRAFTING (CABG), ON PUMP, TIMES FOUR, USING LEFT INTERNAL MAMMARY ARTERY AND ENDOSCOPICALLY HARVESTED RIGHT GREATER SAPHENOUS VEIN;  Surgeon: Kerin Perna, MD;  Location: [facility] OR;  Service: Open Heart Surgery;  Laterality: N/A;  LIMA to LAD; SVG to OM; SVG to Ramus; SVG to Right   IR THORACENTESIS ASP PLEURAL SPACE W/IMG GUIDE  05/25/2017   LEFT HEART CATH AND CORONARY ANGIOGRAPHY N/A 04/04/2017   Procedure: Left Heart Cath and Coronary Angiography;  Surgeon: Peter M [name], MD;  Location: [facility] INVASIVE CV LAB;  Service: Cardiovascular;  Laterality: N/A;   MITRAL VALVE REPAIR N/A 04/12/2017   Procedure: MITRAL VALVE REPLACEMENT (MVR);  Surgeon: Donata Davis, Theron Arista, MD;  Location: [facility] OR;  Service: Open Heart Surgery;  Laterality: N/A;  Using a 74mm Magna Ease Mitral Pericardial Bioprosthesis   TEE WITHOUT CARDIOVERSION N/A 04/12/2017   Procedure: TRANSESOPHAGEAL ECHOCARDIOGRAM (TEE);  Surgeon: Donata Davis, Theron Arista, MD;  Location: [facility] OR;  Service: Open Heart Surgery;  Laterality: N/A;   TUBAL LIGATION       Current Outpatient Medications  Medication Sig Dispense Refill   acetaminophen (TYLENOL) 325 MG  tablet Take 2 tablets (650 mg total) by mouth every 6 (six) hours as needed for mild pain or headache.     aspirin EC 81 MG tablet Take 81 mg by mouth daily.     atorvastatin (LIPITOR) 40 MG tablet TAKE 1 TABLET (40 MG TOTAL) BY MOUTH DAILY AT 6 PM. 90 tablet 3   carvedilol (COREG) 3.125 MG tablet TAKE 1 TABLET (3.125 MG TOTAL) BY MOUTH 2 (TWO) TIMES DAILY. 180 tablet 2   dapagliflozin propanediol (FARXIGA) 10 MG TABS tablet Take 10 mg by mouth daily.     glipiZIDE (GLUCOTROL XL) 10 MG 24 hr tablet Take 1 tablet (10 mg total) by mouth daily with breakfast.     JANUVIA 100 MG tablet Take 100 mg by mouth daily. Patient has not started yet     magnesium oxide (MAG-OX) 400 MG tablet TAKE 1 TABLET BY MOUTH EVERY DAY 90 tablet 3   metFORMIN (GLUCOPHAGE) 1000 MG tablet Take 1,000 mg by mouth 2 (two) times daily with a meal.  valsartan (DIOVAN) 40 MG tablet Take 0.5 tablets (20 mg total) by mouth daily. 30 tablet 2   warfarin (COUMADIN) 5 MG tablet TAKE 1 TO 1 AND 1/2 TABLETS BY MOUTH DAILY AS DIRECTED BY THE COUMADIN CLIN 120 tablet 0   amoxicillin-clavulanate (AUGMENTIN) 875-125 MG tablet Take 1 tablet by mouth every 12 (twelve) hours. (Patient not taking: Reported on 09/21/2021) 14 tablet 0   benzonatate (TESSALON) 100 MG capsule Take 1 capsule (100 mg total) by mouth every 8 (eight) hours. (Patient not taking: Reported on 09/21/2021) 21 capsule 0   fluticasone (FLONASE) 50 MCG/ACT nasal spray Place 2 sprays into both nostrils daily. (Patient not taking: Reported on 09/21/2021) 16 mL 0   furosemide (LASIX) 80 MG tablet Take 1 tablet (80 mg total) by mouth 3 (three) times a week. On Mon, Wed and Fri OR AS DIRECTED 60 tablet 3   No current facility-administered medications for this visit.    Allergies:   Keflex [cephalexin] and Lisinopril    Social History:  The patient  reports that she has never smoked. She has never used smokeless tobacco. She reports that she does not drink alcohol and does not  use drugs.   Family History:  The patient's family history includes Breast cancer in her maternal grandmother; Heart attack (age of onset: 67) in her father.    ROS:  Please see the history of present illness.  (+) palpitations (+) chest pain (+) shortness of breath (+) Bilateral LE edema (feet) All other systems are reviewed and negative.    PHYSICAL EXAM: VS:  BP 130/90   Pulse 88   Ht 5\' 3"  (1.6 m)   Wt 179 lb 4.8 oz (81.3 kg)   SpO2 99%   BMI 31.76 kg/m  , BMI Body mass index is 31.76 kg/m. GENERAL:  Well appearing HEENT: Pupils equal round and reactive, fundi not visualized, oral mucosa unremarkable NECK:  No jugular venous distention, waveform within normal limits, carotid upstroke brisk and symmetric, no bruits LUNGS:  Clear to auscultation bilaterally CHEST: Well-healed midline sternotomy incision HEART:  RRR.  PMI not displaced or sustained,S1 and S2 within normal limits, no S3, no S4, no clicks, no rubs, no murmurs ABD:  Flat, positive bowel sounds normal in frequency in pitch, no bruits, no rebound, no guarding, no midline pulsatile mass, no hepatomegaly, no splenomegaly  EXT:  2 plus pulses throughout, no edema, no cyanosis no clubbing SKIN:  No rashes no nodules NEURO:  Cranial nerves II through XII grossly intact, motor grossly intact throughout PSYCH:  Cognitively intact, oriented to person place and time   EKG:   09/21/2021: Sinus rhythm Rate 88 bpm RBBB, L axis deviation, First degree AV block, Prior inferior infarct 12/09/2020: Sinus rhythm.  First-degree AV block.  Mobitz 1 second-degree heart block.  Prior inferior MI.  Prior anterolateral MI. 06/02/20: Sinus rhythm.  Mobitz I second degree heart block.  Nonsepecific T wave abnormalities 10/23/19: Sinus rhythm.  Rate 92 bpm.  First dedegree.  Anterolateral T wave inversions. 09/19/19: Sinus rhythm.  Rate 88 bpm.  First degree AV block.  Nonspecific ST-T changes.  01/01/18: Sinus rhythm.  First degree heart  block.  Non-specific T wave abnormalities.  06/22/17: sinus rhythm. Rate 91 bpm. Frequent PVCs. Prior inferior infarct. Prior lateral infarct.   LHC 04/04/17: Prox LAD lesion, 75 %stenosed. Prox LAD to Mid LAD lesion, 65 %stenosed. Mid LAD to Dist LAD lesion, 55 %stenosed. Ramus lesion, 90 %stenosed. Lat Ramus lesion, 50 %stenosed. Prox  Cx lesion, 100 %stenosed. Prox RCA to Mid RCA lesion, 95 %stenosed. There is moderate left ventricular systolic dysfunction. LV end diastolic pressure is mildly elevated. The left ventricular ejection fraction is 35-45% by visual estimate. There is mild (2+) mitral regurgitation.   1. Severe 3 vessel obstructive CAD 2. Moderate LV dysfunction 3. Mildly elevated LV EDP  48 hour Holter 10/08/17: Sinus rhythm with frequent PVCs (3%) and brief runs NSVT (3 beats). Appears to have 2 dominant PVC morphologies.   Echo 09/14/17: Study Conclusions   - Left ventricle: The cavity size was normal. Wall thickness was   normal. Systolic function was moderately reduced. The estimated   ejection fraction was in the range of 35% to 40%. Severe   hypokinesis of the inferolateral, inferior, and inferoseptal   myocardium; consistent with infarction in the distribution of the   right coronary and left circumflex coronary artery. Mitral   prosthesis precludes evaluation of LV diastolic function. There   is E-A fusion and pressure half time cannot be measured. - Ventricular septum: Septal motion showed paradox. These changes   are consistent with a post-thoracotomy state. - Mitral valve: A bioprosthesis was present. Transvalvular velocity   was increased more than expected. The findings are consistent   with moderate stenosis. Mean gradient (D): 10 mm Hg. Valve area   by continuity equation (using LVOT flow): 1.18 cm^2. - Left atrium: The atrium was mildly to moderately dilated. - Right ventricle: Systolic function was moderately reduced. - Right atrium: The atrium  was mildly dilated.   Impressions:   - Mitral valve bioprosthesis gradients are lower than reported   earlier this year, but remain abnormal and are substantially   higher than the gradients seen on the intraoperative TEE (mean   gradient 4 mm Hg at 90 bpm). Repeat TEE may be helpful to   evaluate for leaflet thrombois.  Echo 02/06/18:  LVEF 35-40%.  Akinesis of inferolateral and inferior myocardium.  Mildly calcified/thickened aortic valve leaflets.  Bioprosthetic mitral valve with mean gradient 15 mmHg.  PASP 34 mmHg.    Echo 05/28/19: LVEF 35-40%.  Mean bioprosthetic mitral valve gradient 8 mmHg.  LE Doppler 07/21/2020 Right: Resting right ankle-brachial index indicates noncompressible right  lower extremity arteries. The right toe-brachial index is normal.   Left: Resting left ankle-brachial index indicates noncompressible left  lower extremity arteries. The left toe-brachial index is normal.   Echo 12/30/2020  1. Left ventricular ejection fraction, by estimation, is 30 to 35%. The  left ventricle has moderately decreased function. The left ventricle  demonstrates regional wall motion abnormalities (see scoring  diagram/findings for description). There is mild  left ventricular hypertrophy. Left ventricular diastolic function could  not be evaluated.   2. Right ventricular systolic function is moderately reduced. The right  ventricular size is mildly enlarged. There is mildly elevated pulmonary  artery systolic pressure. The estimated right ventricular systolic  pressure is 43.7 mmHg.   3. Abnormal prosthetic mitral valve, VTI ratio 2.8, peak velocity 2.3  m/s, PHT 77 msec, EOA 0.84 cm2. See Recommendations.. The mitral valve has  been repaired/replaced. Mild mitral valve regurgitation. The mean mitral  valve gradient is 8.0 mmHg. There  is a 25 mm [facility] Ease tissue valve present in the mitral position.  Procedure Date: 04/10/2017.   4. Tricuspid valve regurgitation is  mild to moderate.   5. Small, mobile linear structure on the aorta aspect of the aortic  valve. Likely attached to coptation surface of right  coronary cusp.  Present on imaging since TEE in 2018 and grossly unchanged. 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 See  Recommendations.. The aortic valve is grossly normal. Aortic valve  regurgitation is not visualized. No aortic stenosis is present.   6. The inferior vena cava is dilated in size with <50% respiratory  variability, suggesting right atrial pressure of 15 mmHg.   7 Day ZIO Monitor 01/20/2021 Quality: Fair.  Baseline artifact. Predominant rhythm: sinus rhythm with first degree AV block Average heart rate: 86 bpm Max heart rate: 96 bpm Min heart rate: 52 bpm Pauses >2.5 seconds: none Atrial ectopics: frequent (5.2% isolated, <1% couplets, 1.4% triplets)  Ventricular ectopics 1.5% Episodes of Mobitz I, second degree AV block Up to 6 beats NSVT.  Max rate 190 bpm. Frequent runs of SVT lasting up to 5 minutes.  Rate 169 bpm.   Recent Labs: 12/09/2020: ALT 15 02/28/2021: BUN 28; Creatinine, Ser 1.42; Potassium 5.3; Sodium 144    Lipid Panel    Component Value Date/Time   CHOL 135 12/09/2020 0952   TRIG 50 12/09/2020 0952   HDL 56 12/09/2020 0952   CHOLHDL 2.4 12/09/2020 0952   CHOLHDL 3.5 04/06/2017 0243   VLDL 13 04/06/2017 0243   LDLCALC 68 12/09/2020 0952      Wt Readings from Last 3 Encounters:  09/21/21 179 lb 4.8 oz (81.3 kg)  02/25/21 185 lb (83.9 kg)  02/09/21 185 lb (83.9 kg)      ASSESSMENT AND PLAN: Systolic and diastolic CHF, acute on chronic (HCC) Today she is volume overloaded.  She reports shortness of breath orthopnea and chest discomfort.  She has a hard time taking Lasix every day because of working.  For now, she will increase it to daily through Sunday.  We will check a BMP and a BNP next week.  LVEF has been slowly declining.  She did not tolerate Entresto.  Continue carvedilol, losartan,  and Farxiga.  Consider adding spironolactone, but her with her renal function I would not do that at this time.  We will need to consider ICD if her LVEF remains less than 35%.  S/P CABG x 4 She has occasional sharp chest pain that does not seem ischemic.  Continue aspirin, atorvastatin, and carvedilol.  Essential hypertension Blood pressure stable on carvedilol and valsartan.  H/O mitral valve replacement She had a bioprosthetic mitral valve replacement 04/2017.  Mean gradient has been 8 mmHg.  She is volume overloaded on exam today unclear whether this is due to progressive valvular heart disease or acute on chronic systolic diastolic heart failure.  Increasing Lasix for now.  We will need to repeat her echo at the 1 year mark from her prior to assess for stability of her valve.  Consider TEE if there is progressive stenosis.   Current medicines are reviewed at length with the patient today.  The patient does not have concerns regarding medicines.  The following changes have been made:  none  Labs/ tests ordered today include:   Orders Placed This Encounter  Procedures   B Nat Peptide   Basic Metabolic Panel (BMET)   EKG 12-Lead     Disposition:   FU with Haide Klinker C. Duke Salvia, MD, [facility] or APP in 2 weeks   I,Mykaella Javier,acting as a scribe for Chilton Si, MD.,have documented all relevant documentation on the behalf of Chilton Si, MD,as directed by  Chilton Si, MD while in the presence of Chilton Si, MD.  I, Ymani Porcher C. Duke Salvia, MD  have reviewed all documentation for this visit.  The documentation of the exam, diagnosis, procedures, and orders on 09/21/2021 are all accurate and complete.    Signed, Kinberly Perris C. Duke Salvia, MD, [facility]  09/21/2021 1:20 PM    [facility]

## 2021-09-21 NOTE — Assessment & Plan Note (Signed)
Blood pressure stable on carvedilol and valsartan.

## 2021-09-29 ENCOUNTER — Other Ambulatory Visit: Payer: Self-pay

## 2021-09-29 ENCOUNTER — Ambulatory Visit (INDEPENDENT_AMBULATORY_CARE_PROVIDER_SITE_OTHER): Payer: No Typology Code available for payment source

## 2021-09-29 DIAGNOSIS — Z7901 Long term (current) use of anticoagulants: Secondary | ICD-10-CM | POA: Diagnosis not present

## 2021-09-29 DIAGNOSIS — I480 Paroxysmal atrial fibrillation: Secondary | ICD-10-CM | POA: Diagnosis not present

## 2021-09-29 DIAGNOSIS — Z951 Presence of aortocoronary bypass graft: Secondary | ICD-10-CM | POA: Diagnosis not present

## 2021-09-29 DIAGNOSIS — Z5181 Encounter for therapeutic drug level monitoring: Secondary | ICD-10-CM | POA: Diagnosis not present

## 2021-09-29 LAB — POCT INR: INR: 2.2 (ref 2.0–3.0)

## 2021-09-29 NOTE — Patient Instructions (Signed)
Continue with 1 tablet daily except 1.5 tablets each Friday.  Repeat INR in 6 weeks per pt. [phone]

## 2021-09-30 LAB — BASIC METABOLIC PANEL
BUN/Creatinine Ratio: 24 (ref 12–28)
BUN: 35 mg/dL — ABNORMAL HIGH (ref 8–27)
CO2: 22 mmol/L (ref 20–29)
Calcium: 9.6 mg/dL (ref 8.7–10.3)
Chloride: 105 mmol/L (ref 96–106)
Creatinine, Ser: 1.45 mg/dL — ABNORMAL HIGH (ref 0.57–1.00)
Glucose: 82 mg/dL (ref 70–99)
Potassium: 5 mmol/L (ref 3.5–5.2)
Sodium: 143 mmol/L (ref 134–144)
eGFR: 40 mL/min/{1.73_m2} — ABNORMAL LOW (ref >59)

## 2021-09-30 LAB — BRAIN NATRIURETIC PEPTIDE: BNP: 213.8 pg/mL — ABNORMAL HIGH (ref 0.0–100.0)

## 2021-10-21 ENCOUNTER — Ambulatory Visit (HOSPITAL_BASED_OUTPATIENT_CLINIC_OR_DEPARTMENT_OTHER): Payer: No Typology Code available for payment source | Admitting: Family

## 2021-10-31 ENCOUNTER — Other Ambulatory Visit: Payer: Self-pay | Admitting: Family Medicine

## 2021-10-31 DIAGNOSIS — Z1231 Encounter for screening mammogram for malignant neoplasm of breast: Secondary | ICD-10-CM

## 2021-11-10 ENCOUNTER — Other Ambulatory Visit: Payer: Self-pay

## 2021-11-10 ENCOUNTER — Ambulatory Visit (INDEPENDENT_AMBULATORY_CARE_PROVIDER_SITE_OTHER): Payer: No Typology Code available for payment source

## 2021-11-10 DIAGNOSIS — I480 Paroxysmal atrial fibrillation: Secondary | ICD-10-CM | POA: Diagnosis not present

## 2021-11-10 DIAGNOSIS — Z951 Presence of aortocoronary bypass graft: Secondary | ICD-10-CM

## 2021-11-10 DIAGNOSIS — Z5181 Encounter for therapeutic drug level monitoring: Secondary | ICD-10-CM | POA: Diagnosis not present

## 2021-11-10 DIAGNOSIS — Z7901 Long term (current) use of anticoagulants: Secondary | ICD-10-CM

## 2021-11-10 LAB — POCT INR: INR: 1.5 — AB (ref 2.0–3.0)

## 2021-11-10 NOTE — Progress Notes (Signed)
Office Visit    Patient Name: Rachael Davis Date of Encounter: 11/11/2021  PCP:  Donald Prose, [place]  Cardiologist:  Skeet Latch, MD  Advanced Practice Provider:  No care team member to display Electrophysiologist:  None      Chief Complaint    Rachael Davis is a 65 y.o. 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, combined systolic and diastolic heart failure, PVC, HTN, HLD, DM2, PAF presents today for heart failure follow-up  Past Medical History    Past Medical History:  Diagnosis Date   Arrhythmia    Hx PAF, PVC's   CHF (congestive heart failure) ([place])    Coronary artery disease    04/04/2017 STEMI   Diabetes [facility])    H/O mitral valve replacement 09/21/2021   Heart attack ([place])    per pt reprot   Hyperlipidemia    Hypertension    New onset atrial fibrillation ([place]) 05/2017   Past Surgical History:  Procedure Laterality Date   CORONARY ARTERY BYPASS GRAFT N/A 04/12/2017   Procedure: CORONARY ARTERY BYPASS GRAFTING (CABG), ON PUMP, TIMES FOUR, USING LEFT INTERNAL MAMMARY ARTERY AND ENDOSCOPICALLY HARVESTED RIGHT GREATER SAPHENOUS VEIN;  Surgeon: Ivin Poot, MD;  Location: [place];  Service: Open Heart Surgery;  Laterality: N/A;  LIMA to LAD; SVG to OM; SVG to Ramus; SVG to Right   IR THORACENTESIS ASP PLEURAL SPACE W/IMG GUIDE  05/25/2017   LEFT HEART CATH AND CORONARY ANGIOGRAPHY N/A 04/04/2017   Procedure: Left Heart Cath and Coronary Angiography;  Surgeon: Peter M Martinique, MD;  Location: [place] CV LAB;  Service: Cardiovascular;  Laterality: N/A;   MITRAL VALVE REPAIR N/A 04/12/2017   Procedure: MITRAL VALVE REPLACEMENT (MVR);  Surgeon: Prescott Gum, Collier Salina, MD;  Location: [place];  Service: Open Heart Surgery;  Laterality: N/A;  Using a 81mm Magna Ease Mitral Pericardial Bioprosthesis   TEE WITHOUT CARDIOVERSION N/A 04/12/2017   Procedure: TRANSESOPHAGEAL ECHOCARDIOGRAM (TEE);  Surgeon: Prescott Gum, Collier Salina, MD;   Location: [place];  Service: Open Heart Surgery;  Laterality: N/A;   TUBAL LIGATION      Allergies  Allergies  Allergen Reactions   Keflex [Cephalexin] Rash    Skin on feet peeled.   Lisinopril Cough    History of Present Illness    Rachael Davis is a 65 y.o. 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  She presented 04/2017 with STEMI. LHC with multivessel disease recommended for CABG. Echo 04/2017 LVEF 45%, lateral hypokinesis, mild MR. Underwent CAB x 4 (LIMA-LAD, SVG-RI, SVG-OM, SVG-RCA). She was noted in OR to have moderate to severe MR and underwent MVR with 5mm [facility] ease bioprosthetic valve. Brief episode of atrial fibrillation which resolved with metoprolol. Seen in follow up by Dr. Prescott Gum 05/2017 found to be in atrial fibrillation. Repeat echo 05/2017 LVEF 35-40% and bioprosthetic mitral valve functioning appropriately. She was admitted and HF team consulted. Spontaneously converted. Underwent L thoracentesis and was started on Carvedilol. 48 hour Holter monitor 10/2017 with PVC burden 3%.   Repeat echo 02/2018 with LVEF stable 35-40%. Repeat monitor due to palpitations wit hPVC but no atrial fib. Previously had PR prolongation on higher doses of Coreg and metoprolol. 10/2019 Atorvastatin. May 2021 episode of dyspnea for which Lasix increased.   Repeat echo 12/2018 LVEF  30-35%, PASP , mean gradient on MVR 8, RA 15. Seen 02/2021 feeling well with improvement in symptoms since stopping Entresto. Repeat monitor due to palpitations with 6 beat runs of NSVT and frequent episodes of SVT lasting up to 5 minutes.   Last seen 09/21/21 by Dr. Duke Salvia. Noted shortness of breath over the past 6 months as well as palpitations and bilateral feet swelling. She was volume overloaded and Lasix increased to daily for a few days then returned to three times per week.   She  presents today for follow-up.  Reports her dyspnea on exertion is overall stable.  Reports lower extremity edema has improved.  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  Not with taking her Lasix she often has to get up during the night to go to the bathroom.  She attributes that this with poor sleep and fatigue.  She takes her Lasix dose in the afternoon and encouraged to take earlier in the day.  Reports no chest pain, pressure, tightness.  Reports no orthopnea, PND.  She is following a low-sodium diet and drinking less than 2 L of fluid per day.  EKGs/Labs/Other Studies Reviewed:   The following studies were reviewed today: LHC 2017/04/10: Prox LAD lesion, 75 %stenosed. Prox LAD to Mid LAD lesion, 65 %stenosed. Mid LAD to Dist LAD lesion, 55 %stenosed. Ramus lesion, 90 %stenosed. Lat Ramus lesion, 50 %stenosed. Prox Cx lesion, 100 %stenosed. Prox RCA to Mid RCA lesion, 95 %stenosed. There is moderate left ventricular systolic dysfunction. LV end diastolic pressure is mildly elevated. The left ventricular ejection fraction is 35-45% by visual estimate. There is mild (2+) mitral regurgitation.   1. Severe 3 vessel obstructive CAD 2. Moderate LV dysfunction 3. Mildly elevated LV EDP   48 hour Holter 10/08/17: Sinus rhythm with frequent PVCs (3%) and brief runs NSVT (3 beats). Appears to have 2 dominant PVC morphologies.    Echo 09/14/17: Study Conclusions   - Left ventricle: The cavity size was normal. Wall thickness was   normal. Systolic function was moderately reduced. The estimated   ejection fraction was in the range of 35% to 40%. Severe   hypokinesis of the inferolateral, inferior, and inferoseptal   myocardium; consistent with infarction in the distribution of the   right coronary and left circumflex coronary artery. Mitral   prosthesis precludes evaluation of LV diastolic function. There   is  E-A fusion and pressure half time cannot be measured. - Ventricular septum: Septal motion showed paradox. These changes   are consistent with a post-thoracotomy state. - Mitral valve: A bioprosthesis was present. Transvalvular velocity   was increased more than expected. The findings are consistent   with moderate stenosis. Mean gradient (D): 10 mm Hg. Valve area   by continuity equation (using LVOT flow): 1.18 cm^2. - Left atrium: The atrium was mildly to moderately dilated. - Right ventricle: Systolic function was moderately reduced. - Right atrium: The atrium was mildly dilated.   Impressions:   - Mitral valve bioprosthesis gradients are lower than reported   earlier this year, but remain abnormal and are substantially   higher than the gradients seen on the intraoperative TEE (mean   gradient 4 mm Hg at 90 bpm). Repeat TEE may be helpful to   evaluate for leaflet thrombois.   Echo 02/06/18:  LVEF 35-40%.  Akinesis of inferolateral and inferior myocardium.  Mildly calcified/thickened aortic valve leaflets.  Bioprosthetic mitral valve  with mean gradient 15 mmHg.  PASP 34 mmHg.     Echo 05/28/19: LVEF 35-40%.  Mean bioprosthetic mitral valve gradient 8 mmHg.   LE Doppler 07/21/2020 Right: Resting right ankle-brachial index indicates noncompressible right  lower extremity arteries. The right toe-brachial index is normal.   Left: Resting left ankle-brachial index indicates noncompressible left  lower extremity arteries. The left toe-brachial index is normal.    Echo 12/30/2020  1. Left ventricular ejection fraction, by estimation, is 30 to 35%. The  left ventricle has moderately decreased function. The left ventricle  demonstrates regional wall motion abnormalities (see scoring  diagram/findings for description). There is mild  left ventricular hypertrophy. Left ventricular diastolic function could  not be evaluated.   2. Right ventricular systolic function is moderately reduced. The  right  ventricular size is mildly enlarged. There is mildly elevated pulmonary  artery systolic pressure. The estimated right ventricular systolic  pressure is [id number] mmHg.   3. Abnormal prosthetic mitral valve, VTI ratio 2.8, peak velocity 2.3  m/s, PHT 77 msec, EOA 0.84 cm2. See Recommendations.. The mitral valve has  been repaired/replaced. Mild mitral valve regurgitation. The mean mitral  valve gradient is 8.0 mmHg. There  is a 25 mm [facility] Ease tissue valve present in the mitral position.  Procedure Date: 04/10/2017.   4. Tricuspid valve regurgitation is mild to moderate.   5. Small, mobile linear structure on the aorta aspect of the aortic  valve. Likely attached to coptation surface of right coronary cusp.  Present on imaging since TEE in 2018 and grossly unchanged. 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 See  Recommendations.. The aortic valve is grossly normal. Aortic valve  regurgitation is not visualized. No aortic stenosis is present.   6. The inferior vena cava is dilated in size with <50% respiratory  variability, suggesting right atrial pressure of 15 mmHg.    7 Day ZIO Monitor 01/20/2021 Quality: Fair.  Baseline artifact. Predominant rhythm: sinus rhythm with first degree AV block Average heart rate: 86 bpm Max heart rate: 96 bpm Min heart rate: 52 bpm Pauses >2.5 seconds: none Atrial ectopics: frequent (5.2% isolated, <1% couplets, 1.4% triplets)  Ventricular ectopics 1.5% Episodes of Mobitz I, second degree AV block Up to 6 beats NSVT.  Max rate 190 bpm. Frequent runs of SVT lasting up to 5 minutes.  Rate 169 bpm.      EKG: No EKG today  Recent Labs: 12/09/2020: ALT 15 09/29/2021: BNP 213.8; BUN 35; Creatinine, Ser 1.45; Potassium 5.0; Sodium 143  Recent Lipid Panel    Component Value Date/Time   CHOL 135 12/09/2020 0952   TRIG 50 12/09/2020 0952   HDL 56 12/09/2020 0952   CHOLHDL 2.4 12/09/2020 0952   CHOLHDL 3.5 04/06/2017 0243   VLDL 13  04/06/2017 0243   LDLCALC 68 12/09/2020 0952    Risk Assessment/Calculations:   CHA2DS2-VASc Score = 6  } This indicates a 9.7% annual risk of stroke. The patient's score is based upon: CHF History: 1 HTN History: 1 Diabetes History: 1 Stroke History: 0 Vascular Disease History: 1 Age Score: 1 Gender Score: 1   Home Medications   Current Meds  Medication Sig   acetaminophen (TYLENOL) 325 MG tablet Take 2 tablets (650 mg total) by mouth every 6 (six) hours as needed for mild pain or headache.   aspirin EC 81 MG tablet Take 81 mg by mouth daily.   atorvastatin (LIPITOR) 40 MG tablet TAKE 1 TABLET (40 MG TOTAL) BY  MOUTH DAILY AT 6 PM.   dapagliflozin propanediol (FARXIGA) 10 MG TABS tablet Take 10 mg by mouth daily.   furosemide (LASIX) 80 MG tablet Take 1 tablet (80 mg total) by mouth 3 (three) times a week. On Mon, Wed and Fri OR AS DIRECTED   glipiZIDE (GLUCOTROL XL) 10 MG 24 hr tablet Take 1 tablet (10 mg total) by mouth daily with breakfast.   JANUVIA 100 MG tablet Take 100 mg by mouth daily. Patient has not started yet   magnesium oxide (MAG-OX) 400 MG tablet TAKE 1 TABLET BY MOUTH EVERY DAY   metFORMIN (GLUCOPHAGE) 1000 MG tablet Take 1,000 mg by mouth 2 (two) times daily with a meal.   valsartan (DIOVAN) 40 MG tablet Take 0.5 tablets (20 mg total) by mouth daily.   warfarin (COUMADIN) 5 MG tablet TAKE 1 TO 1 AND 1/2 TABLETS BY MOUTH DAILY AS DIRECTED BY THE COUMADIN CLIN   [DISCONTINUED] magnesium oxide (MAG-OX) 400 (240 Mg) MG tablet 1 tablet     Review of Systems      All other systems reviewed and are otherwise negative except as noted above.  Physical Exam    VS:  BP 112/62   Pulse 85   Ht 5\' 3"  (1.6 m)   Wt 182 lb (82.6 kg)   BMI 32.24 kg/m  , BMI Body mass index is 32.24 kg/m.  Wt Readings from Last 3 Encounters:  11/11/21 182 lb (82.6 kg)  09/21/21 179 lb 4.8 oz (81.3 kg)  02/25/21 185 lb (83.9 kg)    GEN: Well nourished, overweight, well developed,  in no acute distress. HEENT: normal. Neck: Supple, no JVD, carotid bruits, or masses. Cardiac: RRR, no murmurs, rubs, or gallops. No clubbing, cyanosis, edema.  Radials/PT 2+ and equal bilaterally.  Respiratory:  Respirations regular and unlabored, clear to auscultation bilaterally. GI: Soft, nontender, nondistended. MS: No deformity or atrophy. Skin: Warm and dry, no rash. Neuro:  Strength and sensation are intact. Psych: Normal affect.  Assessment & Plan    PAF / Palpitations - Denies palpitations. She is on Warfarin.  Denies bleeding complications. Continue Coreg at current dose.   Combined systolic and diastolic heart failure - Grossly euvolemic on exam.  NYHA II-3 symptoms with dyspnea on exertion.  We will update echocardiogram as the last is 1 year previously.  If she has LVEF persistently less than 35 will require referral to EP for ICD consideration.  Her current GDMT includes carvedilol, valsartan, Lasix.  She did not tolerate Entresto.  She had labs at primary care today.  Hopeful they collected BMP so we will be able to reassess renal function and consider adding spironolactone. Heart healthy diet and regular cardiovascular exercise encouraged.    HTN - BP well controlled. Continue current antihypertensive regimen.    HLD, LDL goal <70 -continue atorvastatin 40 mg daily.  CAD s/p CABG - Stable with no anginal symptoms. No indication for ischemic evaluation.  GDMT includes aspirin, atorvastatin, carvedilol. BP well controlled. Continue current antihypertensive regimen.    S/p MVR -prosthetic mitral valve replacement 04/2017.  Mean gradient has been 8 mmHg.  We will repeat echo in January for 1 year follow-up.  If progressive stenosis consider TEE.  Dm2 - Continue to follow with PCP.  Presently off all diabetic medications as her A1c is well controlled for 29-month trial.  Disposition: Follow up in 2 month(s) with Skeet Latch, MD or APP.  Signed, Loel Dubonnet,  NP 11/11/2021, 1:54 PM [facility]

## 2021-11-10 NOTE — Patient Instructions (Signed)
Take 2 tablets tonight only and then Continue with 1 tablet daily except 1.5 tablets each Friday.  Repeat INR in 4 weeks per pt. [phone]

## 2021-11-11 ENCOUNTER — Ambulatory Visit (INDEPENDENT_AMBULATORY_CARE_PROVIDER_SITE_OTHER): Payer: No Typology Code available for payment source | Admitting: Family

## 2021-11-11 ENCOUNTER — Encounter (HOSPITAL_BASED_OUTPATIENT_CLINIC_OR_DEPARTMENT_OTHER): Payer: Self-pay | Admitting: Family

## 2021-11-11 VITALS — BP 112/62 | HR 85 | Ht 63.0 in | Wt 182.0 lb

## 2021-11-11 DIAGNOSIS — I5043 Acute on chronic combined systolic (congestive) and diastolic (congestive) heart failure: Secondary | ICD-10-CM | POA: Diagnosis not present

## 2021-11-11 DIAGNOSIS — Z951 Presence of aortocoronary bypass graft: Secondary | ICD-10-CM | POA: Diagnosis not present

## 2021-11-11 DIAGNOSIS — I509 Heart failure, unspecified: Secondary | ICD-10-CM | POA: Diagnosis not present

## 2021-11-11 DIAGNOSIS — I480 Paroxysmal atrial fibrillation: Secondary | ICD-10-CM | POA: Diagnosis not present

## 2021-11-11 DIAGNOSIS — D6859 Other primary thrombophilia: Secondary | ICD-10-CM

## 2021-11-11 DIAGNOSIS — I25118 Atherosclerotic heart disease of native coronary artery with other forms of angina pectoris: Secondary | ICD-10-CM

## 2021-11-11 DIAGNOSIS — Z952 Presence of prosthetic heart valve: Secondary | ICD-10-CM

## 2021-11-11 DIAGNOSIS — I38 Endocarditis, valve unspecified: Secondary | ICD-10-CM

## 2021-11-11 NOTE — Patient Instructions (Signed)
Medication Instructions:  Your Physician recommend you continue on your current medication as directed.    *If you need a refill on your cardiac medications before your next appointment, please call your pharmacy*   Lab Work: None ordered today    Testing/Procedures: Your physician has requested that you have an echocardiogram In January. Echocardiography is a painless test that uses sound waves to create images of your heart. It provides your doctor with information about the size and shape of your heart and how well your heart's chambers and valves are working. This procedure takes approximately one hour. There are no restrictions for this procedure. [address]    Follow-Up: At [facility], you and your health needs are our priority.  As part of our continuing mission to provide you with exceptional heart care, we have created designated Provider Care Teams.  These Care Teams include your primary Cardiologist (physician) and Advanced Practice Providers (APPs -  Physician Assistants and Nurse Practitioners) who all work together to provide you with the care you need, when you need it.  We recommend signing up for the patient portal called "MyChart".  Sign up information is provided on this After Visit Summary.  MyChart is used to connect with patients for Virtual Visits (Telemedicine).  Patients are able to view lab/test results, encounter notes, upcoming appointments, etc.  Non-urgent messages can be sent to your provider as well.   To learn more about what you can do with MyChart, go to [email or web address].    Your next appointment:   2 month(s)  The format for your next appointment:   In Person  Provider:   Chilton Si, MD, Gillian Shields, NP, or [name], NP    Other Instructions Recommend weighing daily and keeping a log. Please call our office if you have weight gain of 2 pounds overnight or 5 pounds in 1 week. If you gain 2 pounds  overnight or 5 pounds in one week, please take an extra dose of your Lasix.

## 2021-11-21 ENCOUNTER — Telehealth (HOSPITAL_BASED_OUTPATIENT_CLINIC_OR_DEPARTMENT_OTHER): Payer: Self-pay

## 2021-11-21 ENCOUNTER — Encounter (HOSPITAL_BASED_OUTPATIENT_CLINIC_OR_DEPARTMENT_OTHER): Payer: Self-pay | Admitting: Family

## 2021-11-21 NOTE — Telephone Encounter (Signed)
Called pt. To discuss most recent labs and addition of new medication. Phone went straight to VM. Message left with instructions to call back to speak about changes to be made!

## 2021-11-21 NOTE — Telephone Encounter (Signed)
-----   Message from Alver Sorrow, NP sent at 11/21/2021  2:56 PM EST ----- Regarding: Add Spironolactone 12.5mg  QD Miss Rachael Davis was seen 11/11/21 for HF follow up. We deferred medication changes at that time as she had labs at primary care the day prior.   Labs 11/11/21 reviewed show stable kidney function and normal electrolytes (A1c 6.4, ALT 15, AST 13, creatinine 1.23, GFR 48, K 4.8).   Given stable kidney function for optimization of heart failure therapy recommend addition of Spironolactone half tablet (12.5mg ) daily. With repeat BMP in 2 weeks.   Will you please call her to make her aware & send prescription/lab orders, please? We talked about adding an additional fluid pill in her clinic visit so she should be somewhat familiar. This is a gentle fluid pill that will help strengthen her heart.   Thanks! Alver Sorrow, NP

## 2021-11-22 ENCOUNTER — Telehealth (HOSPITAL_BASED_OUTPATIENT_CLINIC_OR_DEPARTMENT_OTHER): Payer: Self-pay

## 2021-11-22 NOTE — Telephone Encounter (Signed)
2nd attempt at calling pt. No answer [id number]

## 2021-11-22 NOTE — Telephone Encounter (Signed)
-----   Message from Alver Sorrow, NP sent at 11/21/2021  2:56 PM EST ----- Regarding: Add Spironolactone 12.5mg  QD Miss [name] was seen 11/11/21 for HF follow up. We deferred medication changes at that time as she had labs at primary care the day prior.   Labs 11/11/21 reviewed show stable kidney function and normal electrolytes (A1c 6.4, ALT 15, AST 13, creatinine 1.23, GFR 48, K 4.8).   Given stable kidney function for optimization of heart failure therapy recommend addition of Spironolactone half tablet (12.5mg ) daily. With repeat BMP in 2 weeks.   Will you please call her to make her aware & send prescription/lab orders, please? We talked about adding an additional fluid pill in her clinic visit so she should be somewhat familiar. This is a gentle fluid pill that will help strengthen her heart.   Thanks! Alver Sorrow, NP

## 2021-11-23 ENCOUNTER — Telehealth (HOSPITAL_BASED_OUTPATIENT_CLINIC_OR_DEPARTMENT_OTHER): Payer: Self-pay

## 2021-11-23 DIAGNOSIS — Z79899 Other long term (current) drug therapy: Secondary | ICD-10-CM

## 2021-11-23 MED ORDER — SPIRONOLACTONE 25 MG PO TABS: 12.5000 mg | ORAL_TABLET | Freq: Every day | ORAL | 3 refills | Status: DC

## 2021-11-23 NOTE — Telephone Encounter (Signed)
Spoke with Rachael Davis, pt is agreeable to trialing sprionolactione, although she is concerned about adding another fluid pill to her regiment. Assured patient this was a much gentler  pill and main intent is to strengthen her heart.    PT. Will report to the [place] in 2 weeks for repeat BMP!

## 2021-11-23 NOTE — Telephone Encounter (Signed)
Called pt x2 and attempted the pts. Daughter and husband ok per DPR. No answer from patient or husband and daughters  phone has no voicemail set up.     Will attempt to call again later today!

## 2021-11-23 NOTE — Telephone Encounter (Signed)
-----   Message from Alver Sorrow, NP sent at 11/21/2021  2:56 PM EST ----- Regarding: Add Spironolactone 12.5mg  QD Rachael Davis was seen 11/11/21 for HF follow up. We deferred medication changes at that time as she had labs at primary care the day prior.   Labs 11/11/21 reviewed show stable kidney function and normal electrolytes (A1c 6.4, ALT 15, AST 13, creatinine 1.23, GFR 48, K 4.8).   Given stable kidney function for optimization of heart failure therapy recommend addition of Spironolactone half tablet (12.5mg ) daily. With repeat BMP in 2 weeks.   Will you please call her to make her aware & send prescription/lab orders, please? We talked about adding an additional fluid pill in her clinic visit so she should be somewhat familiar. This is a gentle fluid pill that will help strengthen her heart.   Thanks! Alver Sorrow, NP

## 2021-11-24 NOTE — Telephone Encounter (Signed)
She was previously on Spironolactone and had elevation of potassium. This was while she was also taking Entresto which can cause elevated potassium. We are starting low dose and also repeating BMP in 1 week.   If she wishes to continue current medications and defer adding Lasix this would be fine.  Though it is reasonable to trial again for optimization of heart failure therapies with careful monitoring of potassium. Recommend she avoid electrolyte drinks, salt substitute, and potassium supplements.   Alver Sorrow, NP

## 2021-11-24 NOTE — Telephone Encounter (Signed)
Pt is f/u on previous message regarding her starting Sprionlactone, she said she was on this med a few years ago and was taken off, she wants to know if our office is sure she needs to re start taking this medicine before she picks it up from the pharmacy.  Pt is currently at work but you are welcomed to leave a message on her VM

## 2021-11-24 NOTE — Telephone Encounter (Signed)
Called pt and reviewed concerns over medication change, pt. Said she is willing to try the medication again and will call us if she has any issues!      "She was previously on Spironolactone and had elevation of potassium. This was while she was also taking Entresto which can cause elevated potassium. We are starting low dose and also repeating BMP in 1 week.    If she wishes to continue current medications and defer adding Lasix this would be fine. Confirmed with provider that she meant spiro, not lasix  Though it is reasonable to trial again for optimization of heart failure therapies with careful monitoring of potassium. Recommend she avoid electrolyte drinks, salt substitute, and potassium supplements.    Alver Sorrow, NP "

## 2021-11-24 NOTE — Telephone Encounter (Signed)
Please advise 

## 2021-12-04 ENCOUNTER — Other Ambulatory Visit: Payer: Self-pay | Admitting: Cardiovascular Disease

## 2021-12-04 DIAGNOSIS — Z7901 Long term (current) use of anticoagulants: Secondary | ICD-10-CM

## 2021-12-08 ENCOUNTER — Ambulatory Visit (INDEPENDENT_AMBULATORY_CARE_PROVIDER_SITE_OTHER): Payer: No Typology Code available for payment source | Admitting: *Deleted

## 2021-12-08 ENCOUNTER — Other Ambulatory Visit: Payer: Self-pay

## 2021-12-08 ENCOUNTER — Encounter (HOSPITAL_COMMUNITY): Payer: Self-pay

## 2021-12-08 ENCOUNTER — Encounter (INDEPENDENT_AMBULATORY_CARE_PROVIDER_SITE_OTHER): Payer: Self-pay

## 2021-12-08 ENCOUNTER — Ambulatory Visit (HOSPITAL_COMMUNITY): Payer: No Typology Code available for payment source | Attending: Internal Medicine

## 2021-12-08 ENCOUNTER — Ambulatory Visit (HOSPITAL_COMMUNITY)
Admission: EM | Admit: 2021-12-08 | Discharge: 2021-12-08 | Disposition: A | Discharge status: Home / Self Care | Payer: No Typology Code available for payment source | Attending: Emergency Medicine | Admitting: Emergency Medicine

## 2021-12-08 DIAGNOSIS — S8011XA Contusion of right lower leg, initial encounter: Secondary | ICD-10-CM

## 2021-12-08 DIAGNOSIS — Z951 Presence of aortocoronary bypass graft: Secondary | ICD-10-CM | POA: Diagnosis not present

## 2021-12-08 DIAGNOSIS — Z7901 Long term (current) use of anticoagulants: Secondary | ICD-10-CM

## 2021-12-08 DIAGNOSIS — L03115 Cellulitis of right lower limb: Secondary | ICD-10-CM

## 2021-12-08 DIAGNOSIS — I38 Endocarditis, valve unspecified: Secondary | ICD-10-CM

## 2021-12-08 DIAGNOSIS — I480 Paroxysmal atrial fibrillation: Secondary | ICD-10-CM | POA: Diagnosis not present

## 2021-12-08 DIAGNOSIS — I509 Heart failure, unspecified: Secondary | ICD-10-CM | POA: Diagnosis present

## 2021-12-08 LAB — ECHOCARDIOGRAM COMPLETE
Area-P 1/2: 3.79 cm2
MV VTI: 0.72 cm2
S' Lateral: 5.6 cm

## 2021-12-08 LAB — POCT INR: INR: 3.7 — AB (ref 2.0–3.0)

## 2021-12-08 MED ORDER — DOXYCYCLINE HYCLATE 100 MG PO CAPS: 100.0000 mg | ORAL_CAPSULE | Freq: Two times a day (BID) | ORAL | 0 refills | Status: DC

## 2021-12-08 NOTE — Progress Notes (Signed)
Results called to patient. Patient verbalizes understanding. Patient is agreeable for EP clinic referral.   Patient states she was just able to get Cleda Daub filled and start taking so she will report for her lab work in 1 week!

## 2021-12-08 NOTE — Patient Instructions (Addendum)
Description   Do not take any Warfarin today then continue with 1 tablet daily except 1.5 tablets each Fridays.  Repeat INR in 4 weeks per pt-advised of risks. [phone]

## 2021-12-08 NOTE — Discharge Instructions (Signed)
There is presence of a superficial blood clot on the front of her leg, this should heal itself and resolve over time  There is also concern for a soft tissue infection called cellulitis and therefore you will be placed on an antibiotic, take doxycycline twice a day for 7 days  Please schedule an appointment with your primary doctor in 1 to 2 weeks for reevaluation  In addition you may place ice over the affected area and 15-minute intervals for comfort and to help reduce swelling  You may also elevate legs on pillows while sitting and lying to help reduce swelling

## 2021-12-08 NOTE — ED Provider Notes (Signed)
[place]    CSN: [id number] Arrival date & time: 12/08/21  1153      History   Chief Complaint Chief Complaint  Patient presents with   Leg Swelling    HPI Rachael Davis is a 66 y.o. female.   Patient presents with right  lower leg erythema, swelling and tenderness for 2 week. Symptoms began after dog ran into or step on leg and foot. Area was size of "golf ball" initially, has reduced in size per patient. ROM intact. Denies numbness, tingling. Has not attempted treatment. History of a.fibb, CHF, STEMI. Taking warfarin level today 3.7.    Past Medical History:  Diagnosis Date   Arrhythmia    Hx PAF, PVC's   CHF (congestive heart failure) (HCC)    Coronary artery disease    04/04/2017 STEMI   Diabetes [facility])    H/O mitral valve replacement 09/21/2021   Heart attack ([place])    per pt reprot   Hyperlipidemia    Hypertension    New onset atrial fibrillation ([place]) 05/2017    Patient Active Problem List   Diagnosis Date Noted   H/O mitral valve replacement 09/21/2021   Hyperkalemia 02/09/2021   Palpitations 01/20/2021   Ischemic cardiomyopathy 01/20/2021   1st degree AV block 01/20/2021   CRI (chronic renal insufficiency), stage 3 (moderate) ([place]) 01/20/2021   Multiple thyroid nodules 08/25/2019   PAF (paroxysmal atrial fibrillation) ([place]) [id number]   Systolic and diastolic CHF, acute on chronic ([place]) 05/23/2017   Volume overload 05/08/2017   Long term (current) use of anticoagulants [Z79.01] 04/23/2017   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   Essential hypertension 04/04/2017   STEMI involving left circumflex coronary artery ([place]) 04/04/2017   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    Past Surgical History:  Procedure Laterality Date   CORONARY ARTERY BYPASS GRAFT N/A 04/12/2017   Procedure: CORONARY ARTERY BYPASS GRAFTING (CABG), ON PUMP, TIMES FOUR, USING LEFT INTERNAL MAMMARY ARTERY AND ENDOSCOPICALLY HARVESTED RIGHT GREATER SAPHENOUS VEIN;   Surgeon: Ivin Poot, MD;  Location: [place] [place];  Service: Open Heart Surgery;  Laterality: N/A;  LIMA to LAD; SVG to OM; SVG to Ramus; SVG to Right   IR THORACENTESIS ASP PLEURAL SPACE W/IMG GUIDE  05/25/2017   LEFT HEART CATH AND CORONARY ANGIOGRAPHY N/A 04/04/2017   Procedure: Left Heart Cath and Coronary Angiography;  Surgeon: Peter M Martinique, MD;  Location: [facility];  Service: Cardiovascular;  Laterality: N/A;   MITRAL VALVE REPAIR N/A 04/12/2017   Procedure: MITRAL VALVE REPLACEMENT (MVR);  Surgeon: Prescott Gum, Collier Salina, MD;  Location: [facility];  Service: Open Heart Surgery;  Laterality: N/A;  Using a 29mm Magna Ease Mitral Pericardial Bioprosthesis   TEE WITHOUT CARDIOVERSION N/A 04/12/2017   Procedure: TRANSESOPHAGEAL ECHOCARDIOGRAM (TEE);  Surgeon: Prescott Gum, Collier Salina, MD;  Location: [place];  Service: Open Heart Surgery;  Laterality: N/A;   TUBAL LIGATION      OB History   No obstetric history on file.      Home Medications    Prior to Admission medications   Medication Sig Start Date End Date Taking? Authorizing Provider  acetaminophen (TYLENOL) 325 MG tablet Take 2 tablets (650 mg total) by mouth every 6 (six) hours as needed for mild pain or headache. 05/28/17   Nani Skillern, PA-C  aspirin EC 81 MG tablet Take 81 mg by mouth daily.    [provider]  atorvastatin (LIPITOR) 40 MG tablet TAKE 1 TABLET (40 MG  TOTAL) BY MOUTH DAILY AT 6 PM. 07/29/21   Skeet Latch, MD  carvedilol (COREG) 3.125 MG tablet TAKE 1 TABLET (3.125 MG TOTAL) BY MOUTH 2 (TWO) TIMES DAILY. 06/13/21 09/21/21  Skeet Latch, MD  dapagliflozin propanediol (FARXIGA) 10 MG TABS tablet Take 10 mg by mouth daily.    [provider]  furosemide (LASIX) 80 MG tablet Take 1 tablet (80 mg total) by mouth 3 (three) times a week. On Mon, Wed and Fri OR AS DIRECTED 09/21/21   Skeet Latch, MD  glipiZIDE (GLUCOTROL XL) 10 MG 24 hr tablet Take 1 tablet (10 mg total) by mouth daily with  breakfast. 05/28/17   Lars Pinks M, PA-C  JANUVIA 100 MG tablet Take 100 mg by mouth daily. Patient has not started yet 03/14/19   [provider]  magnesium oxide (MAG-OX) 400 MG tablet TAKE 1 TABLET BY MOUTH EVERY DAY 10/01/20   Skeet Latch, MD  metFORMIN (GLUCOPHAGE) 1000 MG tablet Take 1,000 mg by mouth 2 (two) times daily with a meal.    [provider]  spironolactone (ALDACTONE) 25 MG tablet Take 0.5 tablets (12.5 mg total) by mouth daily. 11/23/21 02/21/22  Loel Dubonnet, NP  valsartan (DIOVAN) 40 MG tablet Take 0.5 tablets (20 mg total) by mouth daily. 03/01/21   Deberah Pelton, NP  warfarin (COUMADIN) 5 MG tablet TAKE 1 TO 1 AND 1/2 TABLETS BY MOUTH DAILY AS DIRECTED BY THE COUMADIN CLIN 12/06/21   Skeet Latch, MD    Family History Family History  Problem Relation Age of Onset   Heart attack Father 59   Breast cancer Maternal Grandmother     Social History Social History   Tobacco Use   Smoking status: Never   Smokeless tobacco: Never  Vaping Use   Vaping Use: Never used  Substance Use Topics   Alcohol use: No   Drug use: No     Allergies   Keflex [cephalexin] and Lisinopril   Review of Systems Review of Systems  Constitutional: Negative.   Respiratory: Negative.    Cardiovascular:  Positive for leg swelling. Negative for chest pain and palpitations.  Gastrointestinal: Negative.   Musculoskeletal: Negative.   Skin:  Positive for rash. Negative for color change, pallor and wound.  Neurological: Negative.     Physical Exam Triage Vital Signs ED Triage Vitals  Enc Vitals Group     BP 12/08/21 1411 (!) 141/90     Pulse Rate 12/08/21 1411 81     Resp 12/08/21 1411 18     Temp 12/08/21 1411 97.9 F (36.6 C)     Temp Source 12/08/21 1411 Oral     SpO2 12/08/21 1411 100 %     Weight --      Height --      Head Circumference --      Peak Flow --      Pain Score 12/08/21 1408 5     Pain Loc --      Pain Edu? --       Excl. in [place]? --    No data found.  Updated Vital Signs BP (!) 141/90 (BP Location: Right Arm)    Pulse 81    Temp 97.9 F (36.6 C) (Oral)    Resp 18    SpO2 100%   Visual Acuity Right Eye Distance:   Left Eye Distance:   Bilateral Distance:    Right Eye Near:   Left Eye Near:    Bilateral Near:  Physical Exam Constitutional:      Appearance: Normal appearance. She is normal weight.  Eyes:     Extraocular Movements: Extraocular movements intact.  Pulmonary:     Effort: Pulmonary effort is normal.  Musculoskeletal:     Right lower leg: 2+ Edema present.     Left lower leg: 1+ Edema present.       Legs:     Comments: 2x2 Superficial hematoma  with ecchymosis present on anterior right shin, tender to palpation,  pitting edema is spread through lower right extremity and ankle, 2 + dorsalis pedis   Neurological:     Mental Status: She is alert and oriented to person, place, and time. Mental status is at baseline.  Psychiatric:        Mood and Affect: Mood normal.        Behavior: Behavior normal.     UC Treatments / Results  Labs (all labs ordered are listed, but only abnormal results are displayed) Labs Reviewed - No data to display  EKG   Radiology No results found.  Procedures Procedures (including critical care time)  Medications Ordered in UC Medications - No data to display  Initial Impression / Assessment and Plan / UC Course  I have reviewed the triage vital signs and the nursing notes.  Pertinent labs & imaging results that were available during my care of the patient were reviewed by me and considered in my medical decision making (see chart for details).  Cellulitis of right leg Hematoma of right lower leg  Hematoma is superficial, low risk of complications, discussed with patient, advised to continue to monitor, possible concern of cellulitis versus heart failure vs heart failure, no respiratory involvement noted, will cover for infection today  with follow-up with primary care doctor in 1 to 2 weeks for reevaluation, Doxycycline 7-day course prescribed, recommended shortness of elevation of extremity, and ice over hematoma circumference, urgent care follow-up as needed Final Clinical Impressions(s) / UC Diagnoses   Final diagnoses:  None   Discharge Instructions   None    ED Prescriptions   None    PDMP not reviewed this encounter.   Hans Eden, NP 12/08/21 1529

## 2021-12-08 NOTE — ED Triage Notes (Signed)
Pt presents with swelling and redness right lower leg x 2 weeks. States the swelling is going down, at the beginning she had a knot looked lie a tennis ball.   Pt reports her INR was 3,7 today[facility].

## 2021-12-12 ENCOUNTER — Ambulatory Visit: Payer: No Typology Code available for payment source

## 2021-12-22 ENCOUNTER — Other Ambulatory Visit: Payer: Self-pay

## 2021-12-22 ENCOUNTER — Ambulatory Visit
Admission: RE | Admit: 2021-12-22 | Discharge: 2021-12-22 | Disposition: A | Discharge status: Home / Self Care | Payer: No Typology Code available for payment source | Source: Ambulatory Visit | Attending: Physician Assistant | Admitting: Physician Assistant

## 2021-12-22 ENCOUNTER — Other Ambulatory Visit: Payer: Self-pay | Admitting: Physician Assistant

## 2021-12-22 DIAGNOSIS — M7989 Other specified soft tissue disorders: Secondary | ICD-10-CM

## 2022-01-12 ENCOUNTER — Ambulatory Visit (INDEPENDENT_AMBULATORY_CARE_PROVIDER_SITE_OTHER): Payer: No Typology Code available for payment source | Admitting: Family

## 2022-01-12 ENCOUNTER — Encounter (HOSPITAL_BASED_OUTPATIENT_CLINIC_OR_DEPARTMENT_OTHER): Payer: Self-pay | Admitting: Family

## 2022-01-12 ENCOUNTER — Other Ambulatory Visit: Payer: Self-pay

## 2022-01-12 ENCOUNTER — Encounter (HOSPITAL_BASED_OUTPATIENT_CLINIC_OR_DEPARTMENT_OTHER): Payer: Self-pay | Admitting: Cardiovascular Disease

## 2022-01-12 ENCOUNTER — Ambulatory Visit (INDEPENDENT_AMBULATORY_CARE_PROVIDER_SITE_OTHER): Payer: No Typology Code available for payment source

## 2022-01-12 ENCOUNTER — Encounter (HOSPITAL_BASED_OUTPATIENT_CLINIC_OR_DEPARTMENT_OTHER): Payer: Self-pay

## 2022-01-12 VITALS — BP 110/70 | HR 86 | Ht 64.0 in | Wt 180.8 lb

## 2022-01-12 DIAGNOSIS — I25118 Atherosclerotic heart disease of native coronary artery with other forms of angina pectoris: Secondary | ICD-10-CM | POA: Diagnosis not present

## 2022-01-12 DIAGNOSIS — Z951 Presence of aortocoronary bypass graft: Secondary | ICD-10-CM

## 2022-01-12 DIAGNOSIS — I10 Essential (primary) hypertension: Secondary | ICD-10-CM

## 2022-01-12 DIAGNOSIS — R002 Palpitations: Secondary | ICD-10-CM

## 2022-01-12 DIAGNOSIS — I5043 Acute on chronic combined systolic (congestive) and diastolic (congestive) heart failure: Secondary | ICD-10-CM | POA: Diagnosis not present

## 2022-01-12 DIAGNOSIS — I480 Paroxysmal atrial fibrillation: Secondary | ICD-10-CM | POA: Diagnosis not present

## 2022-01-12 DIAGNOSIS — Z7901 Long term (current) use of anticoagulants: Secondary | ICD-10-CM

## 2022-01-12 DIAGNOSIS — E785 Hyperlipidemia, unspecified: Secondary | ICD-10-CM

## 2022-01-12 DIAGNOSIS — Z9889 Other specified postprocedural states: Secondary | ICD-10-CM

## 2022-01-12 DIAGNOSIS — D6859 Other primary thrombophilia: Secondary | ICD-10-CM

## 2022-01-12 DIAGNOSIS — Z5181 Encounter for therapeutic drug level monitoring: Secondary | ICD-10-CM

## 2022-01-12 LAB — POCT INR: INR: 3.7 — AB (ref 2.0–3.0)

## 2022-01-12 NOTE — Patient Instructions (Addendum)
Medication Instructions:  Continue your current medications.   If you notice your palpitations are more bothersome please let us know and we will consider increasing your Carvedilol.   *If you need a refill on your cardiac medications before your next appointment, please call your pharmacy*   Lab Work: Your physician recommends that you return for lab work today: BMP  If you have labs (blood work) drawn today and your tests are completely normal, you will receive your results only by: [place] (if you have MyChart) OR A paper copy in the mail If you have any lab test that is abnormal or we need to change your treatment, we will call you to review the results.  Testing/Procedures: Your physician has requested that you have a lexiscan myoview. For further information please visit [email or web address]. Please follow instruction sheet, as given.    Follow-Up: At [facility], you and your health needs are our priority.  As part of our continuing mission to provide you with exceptional heart care, we have created designated Provider Care Teams.  These Care Teams include your primary Cardiologist (physician) and Advanced Practice Providers (APPs -  Physician Assistants and Nurse Practitioners) who all work together to provide you with the care you need, when you need it.  We recommend signing up for the patient portal called "MyChart".  Sign up information is provided on this After Visit Summary.  MyChart is used to connect with patients for Virtual Visits (Telemedicine).  Patients are able to view lab/test results, encounter notes, upcoming appointments, etc.  Non-urgent messages can be sent to your provider as well.   To learn more about what you can do with MyChart, go to [email or web address].    Your next appointment:   As scheduled 02/06/22 with Dr. Caryl Comes  AND In 3 months with Dr. Oval Linsey or Loel Dubonnet, NP   Other Instructions  Recommend elevating your right leg and  using a gentle heat pack once per day for 10-20 minutes to speed healing process.   Heart Healthy Diet Recommendations: A low-salt diet is recommended. Meats should be grilled, baked, or boiled. Avoid fried foods. Focus on lean protein sources like fish or chicken with vegetables and fruits. The American Heart Association is a Microbiologist!  American Heart Association Diet and Lifeystyle Recommendations    Exercise recommendations: The American Heart Association recommends 150 minutes of moderate intensity exercise weekly. Try 30 minutes of moderate intensity exercise 4-5 times per week. This could include walking, jogging, or swimming.

## 2022-01-12 NOTE — Progress Notes (Signed)
Office Visit    Patient Name: Rachael Davis Date of Encounter: 01/12/2022  PCP:  Donald Prose, MD   [place]  Cardiologist:  Skeet Latch, MD  Advanced Practice Provider:  No care team member to display Electrophysiologist:  None      Chief Complaint    Rachael Davis is a 66 y.o. 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, combined systolic and diastolic heart failure, PVC, HTN, HLD, DM2, PAF presents today for heart failure follow-up  Past Medical History    Past Medical History:  Diagnosis Date   Arrhythmia    Hx PAF, PVC's   CHF (congestive heart failure) ([place])    Coronary artery disease    04/04/2017 STEMI   Diabetes [facility])    H/O mitral valve replacement 09/21/2021   Heart attack ([place])    per pt reprot   Hyperlipidemia    Hypertension    New onset atrial fibrillation ([place]) 05/2017   Past Surgical History:  Procedure Laterality Date   CORONARY ARTERY BYPASS GRAFT N/A 04/12/2017   Procedure: CORONARY ARTERY BYPASS GRAFTING (CABG), ON PUMP, TIMES FOUR, USING LEFT INTERNAL MAMMARY ARTERY AND ENDOSCOPICALLY HARVESTED RIGHT GREATER SAPHENOUS VEIN;  Surgeon: Ivin Poot, MD;  Location: [place];  Service: Open Heart Surgery;  Laterality: N/A;  LIMA to LAD; SVG to OM; SVG to Ramus; SVG to Right   IR THORACENTESIS ASP PLEURAL SPACE W/IMG GUIDE  05/25/2017   LEFT HEART CATH AND CORONARY ANGIOGRAPHY N/A 04/04/2017   Procedure: Left Heart Cath and Coronary Angiography;  Surgeon: Peter M Martinique, MD;  Location: [place] CV LAB;  Service: Cardiovascular;  Laterality: N/A;   MITRAL VALVE REPAIR N/A 04/12/2017   Procedure: MITRAL VALVE REPLACEMENT (MVR);  Surgeon: Prescott Gum, Collier Salina, MD;  Location: [place];  Service: Open Heart Surgery;  Laterality: N/A;  Using a 4mm Magna Ease Mitral Pericardial Bioprosthesis   TEE WITHOUT CARDIOVERSION N/A 04/12/2017   Procedure: TRANSESOPHAGEAL ECHOCARDIOGRAM (TEE);  Surgeon: Prescott Gum, Collier Salina, MD;   Location: [place];  Service: Open Heart Surgery;  Laterality: N/A;   TUBAL LIGATION      Allergies  Allergies  Allergen Reactions   Keflex [Cephalexin] Rash    Skin on feet peeled.   Lisinopril Cough    History of Present Illness    Rachael Davis is a 67 y.o. 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  She presented 04/2017 with STEMI. LHC with multivessel disease recommended for CABG. Echo 04/2017 LVEF 45%, lateral hypokinesis, mild MR. Underwent CAB x 4 (LIMA-LAD, SVG-RI, SVG-OM, SVG-RCA). She was noted in OR to have moderate to severe MR and underwent MVR with 55mm [facility] ease bioprosthetic valve. Brief episode of atrial fibrillation which resolved with metoprolol. Seen in follow up by Dr. Prescott Gum 05/2017 found to be in atrial fibrillation. Repeat echo 05/2017 LVEF 35-40% and bioprosthetic mitral valve functioning appropriately. She was admitted and HF team consulted. Spontaneously converted. Underwent L thoracentesis and was started on Carvedilol. 48 hour Holter monitor 10/2017 with PVC burden 3%.   Repeat echo 02/2018 with LVEF stable 35-40%. Repeat monitor due to palpitations wit hPVC but no atrial fib. Previously had PR prolongation on higher doses of Coreg and metoprolol. 10/2019 Atorvastatin. May 2021 episode of dyspnea for which Lasix increased.   Repeat echo 12/2018 LVEF  30-35%, PASP 45mmHg, mean gradient on MVR 8, RA 15. Seen 02/2021 feeling well with improvement in symptoms since stopping Entresto. Repeat monitor due to palpitations with 6 beat runs of NSVT and frequent episodes of SVT lasting up to 5 minutes.  When seen 09/22/2019 she was volume up and Lasix increased for a few days then return to 3 times per week.  She was seen 11/11/2021 with overall stable dyspnea and improvement in her lower extremity edema.  Updated echocardiogram performed 12/08/2021 revealed reduced  LVEF 25-30% (30% by 3D volume), RV SF moderately reduced, RV mildly enlarged, mildly elevated PASP, mild mitral stenosis of the prosthetic valve, mild to moderate TR. she was referred to EP for consideration of ICD and has upcoming follow-up 02/06/2022.  Spironolactone 12.5 mg daily was initiated.  Presents today for follow up. Shares with me that her leg continues to heal from cellulitis and hematoma in January after her daughter's dog accidentally stepped on it. No evidence of infection and recommend elevation, compression, gentle heat pack. Reports no chest pain, orthopnea, PND. Palpitations are stable at baseline occurring daily but fleeting and overall not bothersome. She endorses exertional dyspnea which is unchanged. She is hopeful to start a walking regimen but has not been moving much. Endorses following heart healthy diet. We reviewed her most recent echocardiogram.  EKGs/Labs/Other Studies Reviewed:   The following studies were reviewed today:  12/08/2021 echocardiogram 1. Left ventricular ejection fraction, by estimation, is 25 to 30%. Left  ventricular ejection fraction by 3D volume is 30 %. The left ventricle has  severely decreased function. The left ventricle demonstrates global  hypokinesis. The left ventricular  internal cavity size was moderately dilated. There is mild left  ventricular hypertrophy. Left ventricular diastolic function could not be  evaluated.   2. Right ventricular systolic function is moderately reduced. The right  ventricular size is mildly enlarged. There is mildly elevated pulmonary  artery systolic pressure. The estimated right ventricular systolic  pressure is [id number] mmHg.   3. The mitral valve has been repaired/replaced. Trivial mitral valve  regurgitation. Mild mitral stenosis. The mean mitral valve gradient is 7.0  mmHg with average heart rate of 83 bpm. There is a 25 mm Magna Ease  Pericardial bioprosthesis present in the  mitral position. Procedure  Date: 09/21/2021. Echo findings are consistent  with stenosis the mitral prosthesis.   4. Tricuspid leaflets are thickened. The tricuspid valve is abnormal.  Tricuspid valve regurgitation is mild to moderate.   5. The aortic valve is tricuspid. Aortic valve regurgitation is not  visualized.   6. The inferior vena cava is dilated in size with <50% respiratory  variability, suggesting right atrial pressure of 15 mmHg.   Comparison(s): Changes from prior study are noted. 12/30/2020: LVEF 30-35%,  mean prosthetic mitral valve gradient of 8 mmHg, mild MR.   LHC 04/04/17: Prox LAD lesion, 75 %stenosed. Prox LAD to Mid LAD lesion, 65 %stenosed. Mid LAD to Dist LAD lesion, 55 %stenosed. Ramus lesion, 90 %stenosed. Lat Ramus lesion, 50 %stenosed. Prox Cx lesion, 100 %stenosed. Prox RCA to Mid RCA lesion, 95 %stenosed. There is moderate left ventricular systolic dysfunction. LV end diastolic pressure is mildly elevated. The left ventricular ejection fraction is 35-45% by visual estimate. There is mild (2+) mitral regurgitation.   1. Severe 3 vessel obstructive CAD 2. Moderate LV dysfunction 3. Mildly elevated LV EDP   48 hour Holter 10/08/17: Sinus rhythm with frequent PVCs (3%) and brief runs NSVT (3 beats). Appears to  have 2 dominant PVC morphologies.    Echo 09/14/17: Study Conclusions   - Left ventricle: The cavity size was normal. Wall thickness was   normal. Systolic function was moderately reduced. The estimated   ejection fraction was in the range of 35% to 40%. Severe   hypokinesis of the inferolateral, inferior, and inferoseptal   myocardium; consistent with infarction in the distribution of the   right coronary and left circumflex coronary artery. Mitral   prosthesis precludes evaluation of LV diastolic function. There   is E-A fusion and pressure half time cannot be measured. - Ventricular septum: Septal motion showed paradox. These changes   are consistent with a  post-thoracotomy state. - Mitral valve: A bioprosthesis was present. Transvalvular velocity   was increased more than expected. The findings are consistent   with moderate stenosis. Mean gradient (D): 10 mm Hg. Valve area   by continuity equation (using LVOT flow): 1.18 cm^2. - Left atrium: The atrium was mildly to moderately dilated. - Right ventricle: Systolic function was moderately reduced. - Right atrium: The atrium was mildly dilated.   Impressions:   - Mitral valve bioprosthesis gradients are lower than reported   earlier this year, but remain abnormal and are substantially   higher than the gradients seen on the intraoperative TEE (mean   gradient 4 mm Hg at 90 bpm). Repeat TEE may be helpful to   evaluate for leaflet thrombois.   Echo 02/06/18:  LVEF 35-40%.  Akinesis of inferolateral and inferior myocardium.  Mildly calcified/thickened aortic valve leaflets.  Bioprosthetic mitral valve with mean gradient 15 mmHg.  PASP 34 mmHg.     Echo 05/28/19: LVEF 35-40%.  Mean bioprosthetic mitral valve gradient 8 mmHg.   LE Doppler 07/21/2020 Right: Resting right ankle-brachial index indicates noncompressible right  lower extremity arteries. The right toe-brachial index is normal.   Left: Resting left ankle-brachial index indicates noncompressible left  lower extremity arteries. The left toe-brachial index is normal.    Echo 12/30/2020  1. Left ventricular ejection fraction, by estimation, is 30 to 35%. The  left ventricle has moderately decreased function. The left ventricle  demonstrates regional wall motion abnormalities (see scoring  diagram/findings for description). There is mild  left ventricular hypertrophy. Left ventricular diastolic function could  not be evaluated.   2. Right ventricular systolic function is moderately reduced. The right  ventricular size is mildly enlarged. There is mildly elevated pulmonary  artery systolic pressure. The estimated right ventricular  systolic  pressure is 43.7 mmHg.   3. Abnormal prosthetic mitral valve, VTI ratio 2.8, peak velocity 2.3  m/s, PHT 77 msec, EOA 0.84 cm2. See Recommendations.. The mitral valve has  been repaired/replaced. Mild mitral valve regurgitation. The mean mitral  valve gradient is 8.0 mmHg. There  is a 25 mm [facility] Ease tissue valve present in the mitral position.  Procedure Date: 04/10/2017.   4. Tricuspid valve regurgitation is mild to moderate.   5. Small, mobile linear structure on the aorta aspect of the aortic  valve. Likely attached to coptation surface of right coronary cusp.  Present on imaging since TEE in 2018 and grossly unchanged. 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 See  Recommendations.. The aortic valve is grossly normal. Aortic valve  regurgitation is not visualized. No aortic stenosis is present.   6. The inferior vena cava is dilated in size with <50% respiratory  variability, suggesting right atrial pressure of 15 mmHg.    7 Day ZIO Monitor 01/20/2021 Quality:  Fair.  Baseline artifact. Predominant rhythm: sinus rhythm with first degree AV block Average heart rate: 86 bpm Max heart rate: 96 bpm Min heart rate: 52 bpm Pauses >2.5 seconds: none Atrial ectopics: frequent (5.2% isolated, <1% couplets, 1.4% triplets)  Ventricular ectopics 1.5% Episodes of Mobitz I, second degree AV block Up to 6 beats NSVT.  Max rate 190 bpm. Frequent runs of SVT lasting up to 5 minutes.  Rate 169 bpm.    EKG: No EKG today  Recent Labs: 09/29/2021: BNP 213.8; BUN 35; Creatinine, Ser 1.45; Potassium 5.0; Sodium 143  Recent Lipid Panel    Component Value Date/Time   CHOL 135 12/09/2020 0952   TRIG 50 12/09/2020 0952   HDL 56 12/09/2020 0952   CHOLHDL 2.4 12/09/2020 0952   CHOLHDL 3.5 04/06/2017 0243   VLDL 13 04/06/2017 0243   LDLCALC 68 12/09/2020 0952    Risk Assessment/Calculations:   CHA2DS2-VASc Score = 6  } This indicates a 9.7% annual risk of stroke. The  patient's score is based upon: CHF History: 1 HTN History: 1 Diabetes History: 1 Stroke History: 0 Vascular Disease History: 1 Age Score: 1 Gender Score: 1    Home Medications   No outpatient medications have been marked as taking for the 01/12/22 encounter (Appointment) with Loel Dubonnet, NP.     Review of Systems      All other systems reviewed and are otherwise negative except as noted above.  Physical Exam    VS:  There were no vitals taken for this visit. , BMI There is no height or weight on file to calculate BMI.  Wt Readings from Last 3 Encounters:  11/11/21 182 lb (82.6 kg)  09/21/21 179 lb 4.8 oz (81.3 kg)  02/25/21 185 lb (83.9 kg)    GEN: Well nourished, overweight, well developed, in no acute distress. HEENT: normal. Neck: Supple, no JVD, carotid bruits, or masses. Cardiac: RRR, no murmurs, rubs, or gallops. No clubbing, cyanosis, edema.  Radials/PT 2+ and equal bilaterally.  Respiratory:  Respirations regular and unlabored, clear to auscultation bilaterally. GI: Soft, nontender, nondistended. MS: No deformity or atrophy. Skin: Warm and dry, no rash. Neuro:  Strength and sensation are intact. Psych: Normal affect.  Assessment & Plan    PAF / Palpitations -  She is on Warfarin. CHA2DS2-VASc Score = 6 [CHF History: 1, HTN History: 1, Diabetes History: 1, Stroke History: 0, Vascular Disease History: 1, Age Score: 1, Gender Score: 1].  Therefore, the patient's annual risk of stroke is 9.7 %.    Denies bleeding complications. Continue Coreg at current dose. Offered to increase dose to 6.25mg  BID due to palpitations but they are not bothersome and she prefers to continue 3.125mg  BID dose.   Combined systolic and diastolic heart failure - [id number] LVEF 35-45% at time of CABG. 05/2019 LVEF 35-40%. Echo 12/2020 LVEF 30-35%. 12/08/21 LVEF 30% by 3D volume. Her current GDMT includes carvedilol, valsartan, Lasix, Spironolactone.  She did not tolerate Entresto. BMP today and if  stable, increase Spironolactone dose. Given persistently reduced LVEF after CABG, discussed with Dr. Oval Linsey and will plan for [facility] to rule out new ischemia. She has upcoming appointment to establish with Dr. Caryl Comes for consideration of ICD. Heart healthy diet and regular cardiovascular exercise encouraged.    HTN - BP well controlled. Continue current antihypertensive regimen.    HLD, LDL goal <70 -continue atorvastatin 40 mg daily.Denies myalgias.   CAD s/p CABG - No chest pain. Exertional dyspnea. Plan for  lexiscan myoview, as above. GDMT includes aspirin, atorvastatin, carvedilol. BP well controlled. Continue current antihypertensive regimen.    S/p MVR -prosthetic mitral valve replacement 04/2017.  Echo 12/2021 with mild stenosis of mitral valve prosthesis with a mean gradient of 7 mmHg.  Monitor with periodic echo.  Dm2 - Continue to follow with PCP.  11/2021 A1c 6.4.  Shared Decision Making/Informed Consent The risks [chest pain, shortness of breath, cardiac arrhythmias, dizziness, blood pressure fluctuations, myocardial infarction, stroke/transient ischemic attack, nausea, vomiting, allergic reaction, radiation exposure, metallic taste sensation and life-threatening complications (estimated to be 1 in 10,000)], benefits (risk stratification, diagnosing coronary artery disease, treatment guidance) and alternatives of a nuclear stress test were discussed in detail with Rachael Davis and she agrees to proceed.   Disposition: Follow up in 3 month(s) with Skeet Latch, MD or APP.  Signed, Loel Dubonnet, NP 01/12/2022, 1:07 PM [place]

## 2022-01-12 NOTE — Patient Instructions (Signed)
HOLD TONIGHT and then decrease to 1 tablet daily.  Repeat INR in 3 weeks. [id number]

## 2022-01-13 ENCOUNTER — Telehealth (HOSPITAL_BASED_OUTPATIENT_CLINIC_OR_DEPARTMENT_OTHER): Payer: Self-pay

## 2022-01-13 DIAGNOSIS — Z5181 Encounter for therapeutic drug level monitoring: Secondary | ICD-10-CM

## 2022-01-13 LAB — BASIC METABOLIC PANEL
BUN/Creatinine Ratio: 20 (ref 12–28)
BUN: 28 mg/dL — ABNORMAL HIGH (ref 8–27)
CO2: 21 mmol/L (ref 20–29)
Calcium: 9.8 mg/dL (ref 8.7–10.3)
Chloride: 105 mmol/L (ref 96–106)
Creatinine, Ser: 1.4 mg/dL — ABNORMAL HIGH (ref 0.57–1.00)
Glucose: 77 mg/dL (ref 70–99)
Potassium: 5.5 mmol/L — ABNORMAL HIGH (ref 3.5–5.2)
Sodium: 142 mmol/L (ref 134–144)
eGFR: 41 mL/min/{1.73_m2} — ABNORMAL LOW (ref >59)

## 2022-01-13 NOTE — Telephone Encounter (Addendum)
Results called to patient who verbalizes understanding!    ----- Message from Alver Sorrow, NP sent at 01/13/2022  9:00 AM EST ----- Stable kidney function. Potassium elevated. Discontinue Spironolactone. Hold Valsartan for 2 days then resume at same dose (half tablet daily). Repeat BMP Tuesday or Wednesday of next week (2/14 or 2/15).

## 2022-01-13 NOTE — Telephone Encounter (Signed)
Patient returned call, transferred to RN.  

## 2022-01-13 NOTE — Telephone Encounter (Addendum)
Called patient who verbalized that she was at work and would call our office back!     ----- Message from Alver Sorrow, NP sent at 01/13/2022  9:00 AM EST ----- Stable kidney function. Potassium elevated. Discontinue Spironolactone. Hold Valsartan for 2 days then resume at same dose (half tablet daily). Repeat BMP Tuesday or Wednesday of next week (2/14 or 2/15).

## 2022-01-23 ENCOUNTER — Other Ambulatory Visit: Payer: Self-pay | Admitting: General Practice

## 2022-01-24 ENCOUNTER — Ambulatory Visit (HOSPITAL_COMMUNITY)
Admission: RE | Admit: 2022-01-24 | Payer: No Typology Code available for payment source | Source: Ambulatory Visit | Attending: Family | Admitting: Family

## 2022-02-01 ENCOUNTER — Telehealth (HOSPITAL_COMMUNITY): Payer: Self-pay | Admitting: *Deleted

## 2022-02-01 NOTE — Telephone Encounter (Signed)
Close encounter 

## 2022-02-02 ENCOUNTER — Ambulatory Visit (HOSPITAL_COMMUNITY)
Admission: RE | Admit: 2022-02-02 | Discharge: 2022-02-02 | Disposition: A | Discharge status: Home / Self Care | Payer: No Typology Code available for payment source | Source: Ambulatory Visit | Attending: Cardiovascular Disease | Admitting: Cardiovascular Disease

## 2022-02-02 ENCOUNTER — Other Ambulatory Visit: Payer: Self-pay

## 2022-02-02 ENCOUNTER — Ambulatory Visit (INDEPENDENT_AMBULATORY_CARE_PROVIDER_SITE_OTHER): Payer: No Typology Code available for payment source

## 2022-02-02 DIAGNOSIS — Z951 Presence of aortocoronary bypass graft: Secondary | ICD-10-CM | POA: Diagnosis not present

## 2022-02-02 DIAGNOSIS — I480 Paroxysmal atrial fibrillation: Secondary | ICD-10-CM

## 2022-02-02 DIAGNOSIS — I25118 Atherosclerotic heart disease of native coronary artery with other forms of angina pectoris: Secondary | ICD-10-CM | POA: Diagnosis present

## 2022-02-02 DIAGNOSIS — I5043 Acute on chronic combined systolic (congestive) and diastolic (congestive) heart failure: Secondary | ICD-10-CM | POA: Insufficient documentation

## 2022-02-02 DIAGNOSIS — Z5181 Encounter for therapeutic drug level monitoring: Secondary | ICD-10-CM | POA: Diagnosis not present

## 2022-02-02 DIAGNOSIS — Z7901 Long term (current) use of anticoagulants: Secondary | ICD-10-CM

## 2022-02-02 LAB — MYOCARDIAL PERFUSION IMAGING
LV dias vol: 230 mL (ref 46–106)
LV sys vol: 190 mL
Nuc Stress EF: 17 %
Peak HR: 90 {beats}/min
Rest HR: 86 {beats}/min
Rest Nuclear Isotope Dose: 10.3 mCi
SDS: 3
SRS: 20
SSS: 21
ST Depression (mm): 0 mm
Stress Nuclear Isotope Dose: 32.4 mCi
TID: 1.04

## 2022-02-02 LAB — POCT INR: INR: 3.8 — AB (ref 2.0–3.0)

## 2022-02-02 MED ORDER — REGADENOSON 0.4 MG/5ML IV SOLN
0.4000 mg | Freq: Once | INTRAVENOUS | Status: AC
  Administered 2022-02-02: 0.4 mg via INTRAVENOUS

## 2022-02-02 MED ORDER — TECHNETIUM TC 99M TETROFOSMIN IV KIT
32.4000 | PACK | Freq: Once | INTRAVENOUS | Status: AC | PRN
  Administered 2022-02-02: 32.4 via INTRAVENOUS
  Filled 2022-02-02: qty 33

## 2022-02-02 MED ORDER — TECHNETIUM TC 99M TETROFOSMIN IV KIT
10.3000 | PACK | Freq: Once | INTRAVENOUS | Status: AC | PRN
  Administered 2022-02-02: 10.3 via INTRAVENOUS
  Filled 2022-02-02: qty 11

## 2022-02-02 NOTE — Patient Instructions (Signed)
HOLD TONIGHT and then continue 1 tablet daily.  Repeat INR in 3 weeks. [phone] ?

## 2022-02-03 ENCOUNTER — Other Ambulatory Visit (HOSPITAL_BASED_OUTPATIENT_CLINIC_OR_DEPARTMENT_OTHER): Payer: Self-pay | Admitting: Family

## 2022-02-03 ENCOUNTER — Telehealth (HOSPITAL_BASED_OUTPATIENT_CLINIC_OR_DEPARTMENT_OTHER): Payer: Self-pay

## 2022-02-03 DIAGNOSIS — I5020 Unspecified systolic (congestive) heart failure: Secondary | ICD-10-CM

## 2022-02-03 NOTE — Telephone Encounter (Addendum)
Results called to patient who verbalizes understanding!  ? ?Patient to have labs done Monday  ? ? ?----- Message from Alver Sorrow, NP sent at 02/03/2022 10:31 AM EST ----- ?Stress test was poor quality study which limits interpretation. There is finding consistent with prior MI but no new ischemia. Discussed with Dr. Duke Salvia who recommend referral to Advanced Heart Failure clinic for further optimization of her heart failure therapy. I have placed referral - please update patient. ?

## 2022-02-06 ENCOUNTER — Other Ambulatory Visit: Payer: Self-pay

## 2022-02-06 ENCOUNTER — Ambulatory Visit (INDEPENDENT_AMBULATORY_CARE_PROVIDER_SITE_OTHER): Payer: No Typology Code available for payment source

## 2022-02-06 ENCOUNTER — Ambulatory Visit (INDEPENDENT_AMBULATORY_CARE_PROVIDER_SITE_OTHER): Payer: No Typology Code available for payment source | Admitting: Internal Medicine

## 2022-02-06 ENCOUNTER — Encounter: Payer: Self-pay | Admitting: Internal Medicine

## 2022-02-06 VITALS — BP 122/70 | HR 96 | Ht 64.0 in | Wt 180.0 lb

## 2022-02-06 DIAGNOSIS — R002 Palpitations: Secondary | ICD-10-CM

## 2022-02-06 DIAGNOSIS — I5043 Acute on chronic combined systolic (congestive) and diastolic (congestive) heart failure: Secondary | ICD-10-CM

## 2022-02-06 DIAGNOSIS — I255 Ischemic cardiomyopathy: Secondary | ICD-10-CM | POA: Diagnosis not present

## 2022-02-06 DIAGNOSIS — I480 Paroxysmal atrial fibrillation: Secondary | ICD-10-CM | POA: Diagnosis not present

## 2022-02-06 MED ORDER — FUROSEMIDE 80 MG PO TABS: 80.0000 mg | ORAL_TABLET | Freq: Every day | ORAL | 1 refills | Status: DC

## 2022-02-06 NOTE — Progress Notes (Unsigned)
Enrolled patient for a 7 day Zio XT monitor to be mailed to patients home.  

## 2022-02-06 NOTE — Patient Instructions (Addendum)
Medication Instructions:  ?Your physician has recommended you make the following change in your medication:  ? ?** Stop Diovan** ? ?** Increase your Furosemide 80mg  - 1 tablet by mouth Daily ? ?*If you need a refill on your cardiac medications before your next appointment, please call your pharmacy* ? ? ?Lab Work: ?CBC and BMET next week ?If you have labs (blood work) drawn today and your tests are completely normal, you will receive your results only by: ?MyChart Message (if you have MyChart) OR ?A paper copy in the mail ?If you have any lab test that is abnormal or we need to change your treatment, we will call you to review the results. ? ? ?Testing/Procedures: ?Your physician has requested that you have a cardiac MRI. Cardiac MRI uses a computer to create images of your heart as its beating, producing both still and moving pictures of your heart and major blood vessels. For further information please visit [email or web address] Please follow the instruction sheet given to you today for more information.  ? ? ?Follow-Up: ?At [facility], you and your health needs are our priority.  As part of our continuing mission to provide you with exceptional heart care, we have created designated Provider Care Teams.  These Care Teams include your primary Cardiologist (physician) and Advanced Practice Providers (APPs -  Physician Assistants and Nurse Practitioners) who all work together to provide you with the care you need, when you need it. ? ?We recommend signing up for the patient portal called "MyChart".  Sign up information is provided on this After Visit Summary.  MyChart is used to connect with patients for Virtual Visits (Telemedicine).  Patients are able to view lab/test results, encounter notes, upcoming appointments, etc.  Non-urgent messages can be sent to your provider as well.   ?To learn more about what you can do with MyChart, go to [email or web address].   ? ?Your next appointment:  Follow up with Dr  Rachael Davis to be determined after further testing.1}  ? ? ?Other Instructions ? ?** You have been referred to Heart Failure Clinic ? ?ZIO XT- Long Term Monitor Instructions ? ?Your physician has requested you wear a ZIO patch monitor for 7 days.  ?This is a single patch monitor. Rachael Davis supplies one patch monitor per enrollment. Additional ?stickers are not available. Please do not apply patch if you will be having a Nuclear Stress Test,  ?Echocardiogram, Cardiac CT, MRI, or Chest Xray during the period you would be wearing the  ?monitor. The patch cannot be worn during these tests. You cannot remove and re-apply the  ?ZIO XT patch monitor.  ?Your ZIO patch monitor will be mailed 3 day USPS to your address on file. It may take 3-5 days  ?to receive your monitor after you have been enrolled.  ?Once you have received your monitor, please review the enclosed instructions. Your monitor  ?has already been registered assigning a specific monitor serial # to you. ? ?Billing and Patient Assistance Program Information ? ?We have supplied Rachael Davis with any of your insurance information on file for billing purposes. ?Rachael Davis offers a sliding scale Patient Assistance Program for patients that do not have  ?insurance, or whose insurance does not completely cover the cost of the ZIO monitor.  ?You must apply for the Patient Assistance Program to qualify for this discounted rate.  ?To apply, please call Rachael Davis at [phone], select option 4, select option 2, ask to apply for  ?Patient Assistance Program. Rachael Davis will ask your household income, and  how many people  ?are in your household. They will quote your out-of-pocket cost based on that information.  ?Rachael Davis will also be able to set up a 28-month, interest-free payment plan if needed. ? ?Applying the monitor ?  ?Shave hair from upper left chest.  ?Hold abrader disc by orange tab. Rub abrader in 40 strokes over the upper left chest as  ?indicated in your monitor instructions.   ?Clean area with 4 enclosed alcohol pads. Let dry.  ?Apply patch as indicated in monitor instructions. Patch will be placed under collarbone on left  ?side of chest with arrow pointing upward.  ?Rub patch adhesive wings for 2 minutes. Remove white label marked "1". Remove the white  ?label marked "2". Rub patch adhesive wings for 2 additional minutes.  ?While looking in a mirror, press and release button in center of patch. A small green light will  ?flash 3-4 times. This will be your only indicator that the monitor has been turned on.  ?Do not shower for the first 24 hours. You may shower after the first 24 hours.  ?Press the button if you feel a symptom. You will hear a small click. Record Date, Time and  ?Symptom in the Patient Logbook.  ?When you are ready to remove the patch, follow instructions on the last 2 pages of Patient  ?Logbook. Stick patch monitor onto the last page of Patient Logbook.  ?Place Patient Logbook in the blue and white box. Use locking tab on box and tape box closed  ?securely. The blue and white box has prepaid postage on it. Please place it in the mailbox as  ?soon as possible. Your physician should have your test results approximately 7 days after the  ?monitor has been mailed back to [facility].  ?Call [facility] at [phone] if you have questions regarding  ?your ZIO XT patch monitor. Call them immediately if you see an orange light blinking on your  ?monitor.  ?If your monitor falls off in less than 4 days, contact our Monitor department at [phone].  ?If your monitor becomes loose or falls off after 4 days call Rachael Davis at [phone] for  ?suggestions on securing your monitor  ?

## 2022-02-06 NOTE — Progress Notes (Signed)
? ? ? ? ?ELECTROPHYSIOLOGY CONSULT NOTE  ?Patient ID: Rachael Davis, MRN: [id number], DOB/AGE: [birth year] 67 y.o. ?Admit date: (Not on file) ?Date of Consult: 02/06/2022 ? ?Primary Physician: Donald Prose, MD ?Primary Cardiologist: TR ?  ?  ?Rachael Davis is a 66 y.o. female who is being seen today for the evaluation of ICD at the request of TR .  ? ? ?HPI ?Rachael Davis is a 66 y.o. female referred for consideration of an ICD. ?She has a history of ischemic heart disease with three-vessel disease prompting bypass surgery 5/18 at which time she also had a mitral valve bioprosthetic inserted ?  ?  ?The patient denies chest pain.  There have been no palpitations, lightheadedness or syncope.  Complains of dyspnea on exertion, she has some nocturnal dyspnea with 3 pillow orthopnea.  And peripheral edema.  She thinks that her symptoms worsened following the discontinuation of Entresto under taken because of hyperkalemia; this also has precluded the use of spironolactone.  She was started back on losartan.  Last potassium is elevated ? ? ? ? ? ?DATE TEST EF   ?5/18 LHC  3v CAD  ?3/19 Echo   35-40 %   ?1/23 Echo   25-30 %   ?3/23 Myoview  17% Fixed defect  ? ?Date Cr K Hgb  ?      ?2/23 1.4 5.5   ? ? ? ? ?Past Medical History:  ?Diagnosis Date  ? Arrhythmia   ? Hx PAF, PVC's  ? CHF (congestive heart failure) (Rachael Davis)   ? Coronary artery disease   ? 04/04/2017 STEMI  ? Diabetes (Rachael Davis)   ? H/O mitral valve replacement 09/21/2021  ? Heart attack (Rachael Davis)   ? per pt reprot  ? Hyperlipidemia   ? Hypertension   ? New onset atrial fibrillation (Rachael Davis) 05/2017  ?   ? ?Surgical History:  ?Past Surgical History:  ?Procedure Laterality Date  ? CORONARY ARTERY BYPASS GRAFT N/A 04/12/2017  ? Procedure: CORONARY ARTERY BYPASS GRAFTING (CABG), ON PUMP, TIMES FOUR, USING LEFT INTERNAL MAMMARY ARTERY AND ENDOSCOPICALLY HARVESTED RIGHT GREATER SAPHENOUS VEIN;  Surgeon: Ivin Poot, MD;  Location: [place];  Service: Open Heart Surgery;  Laterality:  N/A;  LIMA to LAD; SVG to OM; SVG to Ramus; SVG to Right  ? IR THORACENTESIS ASP PLEURAL SPACE W/IMG GUIDE  05/25/2017  ? LEFT HEART CATH AND CORONARY ANGIOGRAPHY N/A 04/04/2017  ? Procedure: Left Heart Cath and Coronary Angiography;  Surgeon: Peter M Martinique, MD;  Location: [facility];  Service: Cardiovascular;  Laterality: N/A;  ? MITRAL VALVE REPAIR N/A 04/12/2017  ? Procedure: MITRAL VALVE REPLACEMENT (MVR);  Surgeon: Prescott Gum, Collier Salina, MD;  Location: [place];  Service: Open Heart Surgery;  Laterality: N/A;  Using a 92mm Magna Ease Mitral Pericardial Bioprosthesis  ? TEE WITHOUT CARDIOVERSION N/A 04/12/2017  ? Procedure: TRANSESOPHAGEAL ECHOCARDIOGRAM (TEE);  Surgeon: Prescott Gum, Collier Salina, MD;  Location: [place];  Service: Open Heart Surgery;  Laterality: N/A;  ? TUBAL LIGATION    ?  ? ?Home Meds: ?No outpatient medications have been marked as taking for the 02/06/22 encounter (Office Visit) with Deboraha Sprang, MD.  ?Medications reviewed on the printed AVS and were up to date as listed ?Furosemide 80 mg 3 and now 4 /week ? ?Allergies:  ?Allergies  ?Allergen Reactions  ? Keflex [Cephalexin] Rash  ?  Skin on feet peeled.  ? Asa [Aspirin]   ?  heartburn  ? Lisinopril Cough  ? ? ?  Social History  ? ?Socioeconomic History  ? Marital status: Married  ?  Spouse name: Not on file  ? Number of children: Not on file  ? Years of education: Not on file  ? Highest education level: Not on file  ?Occupational History  ? Not on file  ?Tobacco Use  ? Smoking status: Never  ? Smokeless tobacco: Never  ?Vaping Use  ? Vaping Use: Never used  ?Substance and Sexual Activity  ? Alcohol use: No  ? Drug use: No  ? Sexual activity: Not on file  ?Other Topics Concern  ? Not on file  ?Social History Narrative  ? Not on file  ? ?Social Determinants of Health  ? ?Financial Resource Strain: Not on file  ?Food Insecurity: Not on file  ?Transportation Needs: Not on file  ?Physical Activity: Not on file  ?Stress: Not on file  ?Social Connections: Not on  file  ?Intimate Partner Violence: Not on file  ?  ? ?Family History  ?Problem Relation Age of Onset  ? Heart attack Father 14  ? Breast cancer Maternal Grandmother   ?  ? ?ROS:  Please see the history of present illness.     All other systems reviewed and negative.  ? ? ?Physical Exam:  ?Blood pressure 122/70, pulse 96, height 5\' 4"  (1.626 m), weight 180 lb (81.6 kg), SpO2 99 %. ?General: Well developed, well nourished female in no acute distress. ?Head: Normocephalic, atraumatic, sclera non-icteric, no xanthomas, nares are without discharge. ?EENT: normal  ?Lymph Nodes:  none ?Neck: Negative for carotid bruits. JVD 10 cm  ?back:without scoliosis kyphosis  ?Lungs: Clear bilaterally to auscultation without wheezes, rales, or rhonchi. Breathing is unlabored. ?Heart: RRR with S1 S2.  2/6 systolic murmur decreasing with inspiration; also a loud high-frequency knock; (had to talk to Dr. Simon Rhein S, sternal cracking noise). No rubs, or gallops appreciated. ?Abdomen: Soft, non-tender, non-distended with normoactive bowel sounds. No hepatomegaly. No rebound/guarding. No obvious abdominal masses. ?Msk:  Strength and tone appear normal for age. ?Extremities: No clubbing or cyanosis.  2+ edema.  Distal pedal pulses are 2+ and equal bilaterally. ?Skin: Warm and Dry ?Neuro: Alert and oriented X 3. CN III-XII intact Grossly normal sensory and motor function . ?Psych:  Responds to questions appropriately with a normal affect. ?  ?  ?  ? ?EKG: Sinus at 96 ?26/12/39 ?Axis II 45 ? 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 ? ? ?Assessment and Plan:  ?Ischemic cardiomyopathy ? ?Congestive heart failure-chronic-systolic class IIb-III 80 ? ?Abnormal ECG with progressive conduction system disease ? ?Atrial fibrillation-remote  (last note that I can see is 6/18 shortly after her surgery) ? ?Hypertension ? ?Hyperkalemia ? ?PVCs ? ? ?The patient has left ventricular dysfunction with persistent ejection fraction below 35% in the setting of ischemic cardiomyopathy.  She is appropriately considered for an ICD for primary prevention.  She has however possibly some further reversibility of her cardiomyopathy and she would like to pursue this prior to undertaking an ICD; specifically, her symptoms are worse since the discontinuation of the Entresto and I have reached out to the heart failure clinic and we will with their guidance, resuming Entresto with the use of Lokelma following the discontinuation of her losartan and the identification of a relatively normal potassium.  To this  end, we will also be increasing her diuretic from 3 days a week--daily which should further help with kaliuresis. ? ?The progressive conduction system disease raises the concern of a secondary process in her myocardium such as amyloid.  The Q waves inferiorly antedate the conduction system process.  We will undertake a cMRI. ? ?Her last hemoglobin was sometime ago.  We will check it again when we check her Bmet next week. ? ?Given her PVCs notable on physical examination, we will get a Zio patch to quantitate to see if this is another potential contributor to her cardiomyopathy ? ? ?Virl Axe ? ?

## 2022-02-16 ENCOUNTER — Other Ambulatory Visit: Payer: No Typology Code available for payment source

## 2022-02-16 DIAGNOSIS — R002 Palpitations: Secondary | ICD-10-CM | POA: Diagnosis not present

## 2022-02-23 ENCOUNTER — Other Ambulatory Visit: Payer: Self-pay

## 2022-02-23 ENCOUNTER — Ambulatory Visit (INDEPENDENT_AMBULATORY_CARE_PROVIDER_SITE_OTHER): Payer: No Typology Code available for payment source

## 2022-02-23 DIAGNOSIS — I480 Paroxysmal atrial fibrillation: Secondary | ICD-10-CM

## 2022-02-23 DIAGNOSIS — Z5181 Encounter for therapeutic drug level monitoring: Secondary | ICD-10-CM

## 2022-02-23 DIAGNOSIS — I5043 Acute on chronic combined systolic (congestive) and diastolic (congestive) heart failure: Secondary | ICD-10-CM

## 2022-02-23 DIAGNOSIS — Z7901 Long term (current) use of anticoagulants: Secondary | ICD-10-CM

## 2022-02-23 DIAGNOSIS — Z951 Presence of aortocoronary bypass graft: Secondary | ICD-10-CM | POA: Diagnosis not present

## 2022-02-23 DIAGNOSIS — I255 Ischemic cardiomyopathy: Secondary | ICD-10-CM

## 2022-02-23 DIAGNOSIS — R002 Palpitations: Secondary | ICD-10-CM

## 2022-02-23 LAB — BASIC METABOLIC PANEL
BUN/Creatinine Ratio: 20 (ref 12–28)
BUN: 27 mg/dL (ref 8–27)
CO2: 24 mmol/L (ref 20–29)
Calcium: 9.5 mg/dL (ref 8.7–10.3)
Chloride: 104 mmol/L (ref 96–106)
Creatinine, Ser: 1.34 mg/dL — ABNORMAL HIGH (ref 0.57–1.00)
Glucose: 73 mg/dL (ref 70–99)
Potassium: 5.1 mmol/L (ref 3.5–5.2)
Sodium: 142 mmol/L (ref 134–144)
eGFR: 44 mL/min/{1.73_m2} — ABNORMAL LOW (ref >59)

## 2022-02-23 LAB — CBC
Hematocrit: 36.2 % (ref 34.0–46.6)
Hemoglobin: 10.9 g/dL — ABNORMAL LOW (ref 11.1–15.9)
MCH: 21 pg — ABNORMAL LOW (ref 26.6–33.0)
MCHC: 30.1 g/dL — ABNORMAL LOW (ref 31.5–35.7)
MCV: 70 fL — ABNORMAL LOW (ref 79–97)
Platelets: 194 10*3/uL (ref 150–450)
RBC: 5.2 x10E6/uL (ref 3.77–5.28)
RDW: 17.7 % — ABNORMAL HIGH (ref 11.7–15.4)
WBC: 6.1 10*3/uL (ref 3.4–10.8)

## 2022-02-23 LAB — POCT INR: INR: 1.7 — AB (ref 2.0–3.0)

## 2022-02-23 NOTE — Patient Instructions (Signed)
TAKE 2 TABLETS TODAY ONLY and then  continue 1 tablet daily.  Repeat INR in 3 weeks. [phone] ? ?

## 2022-03-04 ENCOUNTER — Other Ambulatory Visit: Payer: Self-pay | Admitting: Cardiovascular Disease

## 2022-03-04 DIAGNOSIS — Z7901 Long term (current) use of anticoagulants: Secondary | ICD-10-CM

## 2022-03-07 ENCOUNTER — Telehealth: Payer: Self-pay | Admitting: Internal Medicine

## 2022-03-07 DIAGNOSIS — I5043 Acute on chronic combined systolic (congestive) and diastolic (congestive) heart failure: Secondary | ICD-10-CM

## 2022-03-07 DIAGNOSIS — I5020 Unspecified systolic (congestive) heart failure: Secondary | ICD-10-CM

## 2022-03-07 DIAGNOSIS — I480 Paroxysmal atrial fibrillation: Secondary | ICD-10-CM

## 2022-03-07 DIAGNOSIS — I255 Ischemic cardiomyopathy: Secondary | ICD-10-CM

## 2022-03-07 NOTE — Telephone Encounter (Signed)
Irving Burton with [employer] is calling to report abnormal Zio results. ? ?Phone#: [phone] ref#: [id number] ?

## 2022-03-07 NOTE — Telephone Encounter (Signed)
Zio patch full report received... pt has had 6 runs of VT per the call into triage today from [facility]... will forward report to Dr. Graciela Husbands for his review today in the office.  ? ?Patch worn for 7 days.  ?

## 2022-03-07 NOTE — Telephone Encounter (Signed)
Call transferred to [facility], [place]. ?

## 2022-03-07 NOTE — Telephone Encounter (Signed)
Report is available for review in Epic. ?

## 2022-03-08 ENCOUNTER — Other Ambulatory Visit: Payer: Self-pay | Admitting: Cardiovascular Disease

## 2022-03-08 ENCOUNTER — Telehealth: Payer: Self-pay

## 2022-03-08 MED ORDER — ENTRESTO 24-26 MG PO TABS: 1.0000 | ORAL_TABLET | Freq: Two times a day (BID) | ORAL | 1 refills | Status: DC

## 2022-03-08 NOTE — Telephone Encounter (Signed)
Attempte to send Rx for [facility] to pt's pharmacy. This medication is not covered under pt's insurance.  Contacted Dr Graciela Husbands and advised Rachael Davis is not a covered medication.  Dr Graciela Husbands recommends not starting Entresto 24/26 or Lokelma.  Contacted [facility] and canceled [employer].   ?Contacted and spoke with pt and advised Dr Graciela Husbands did want her to start either medication due to cost of Lokelma and that Entresto increases serum K+.  Pt verbalizes understanding and agrees with current plan. ?

## 2022-03-08 NOTE — Telephone Encounter (Signed)
Attempted phone call to pt.  OK per Epic to leave detailed voicemail message.  Pt advised labs are normal with exception K+ being borderline elevated.  Begin Entresto 24/26 1 tablet by mouth twice daily and Lokelma 5mg  daily.  Repeat BMET lab in 2 weeks.  Pt advised to contact RN with any further questions or concerns. ?

## 2022-03-08 NOTE — Telephone Encounter (Signed)
-----   Message from Duke Salvia, MD sent at 03/02/2022  5:29 PM EDT ----- ?Please Inform Patient that labs are normal  ?K is borderline ?Lets begin her on entresto 24/26 and lokelma 5mg  daily with BMET in 2 weeks ?T or C  can you guys followup with the labs on this ?   ?Thanks ? ?

## 2022-03-10 NOTE — Telephone Encounter (Signed)
Please Inform Patient ?That monitor showed only abuot 2.5 % PVCs  not enough to be responsible for her cardiomyopathy BUT also showed sustained VT and so regardless of what we can do about her meds and EF, she should probably go ahead and we should implant the ICD ?We will  work on the [place] later  ?Thanks  ?

## 2022-03-13 ENCOUNTER — Telehealth (HOSPITAL_COMMUNITY): Payer: Self-pay | Admitting: Emergency Medicine

## 2022-03-13 NOTE — Telephone Encounter (Signed)
Attempted to call patient regarding upcoming cardiac MR appointment. Left message on voicemail with name and callback number Zarai Orsborn RN Navigator Cardiac Imaging [facility] [phone] Office [phone] Cell  

## 2022-03-14 ENCOUNTER — Ambulatory Visit (HOSPITAL_COMMUNITY)
Admission: RE | Admit: 2022-03-14 | Discharge: 2022-03-14 | Disposition: A | Discharge status: Home / Self Care | Payer: No Typology Code available for payment source | Source: Ambulatory Visit | Attending: Internal Medicine | Admitting: Internal Medicine

## 2022-03-14 ENCOUNTER — Encounter (HOSPITAL_COMMUNITY): Payer: Self-pay

## 2022-03-14 DIAGNOSIS — I480 Paroxysmal atrial fibrillation: Secondary | ICD-10-CM

## 2022-03-14 DIAGNOSIS — I5043 Acute on chronic combined systolic (congestive) and diastolic (congestive) heart failure: Secondary | ICD-10-CM

## 2022-03-14 DIAGNOSIS — I255 Ischemic cardiomyopathy: Secondary | ICD-10-CM

## 2022-03-14 NOTE — Telephone Encounter (Signed)
Spoke with pt and advised of Dr Olin Pia recommendation to move forward with ICD placement due to sustained VT.  Provided education for pt re: arrhythmia and implant of cardiac defibrillator. ?Pt states she would like to discuss with her family and work and will Clinical research associate once she has made a decision re: ICD implant.  Offered to schedule appointment to discuss with Dr Caryl Comes further and pt declines at this time.  She is scheduled for a cMRI this afternoon.  Provided office number of [phone] to pt.  Pt verbalized understanding of all details discussed. ?  ?

## 2022-03-15 ENCOUNTER — Telehealth (HOSPITAL_COMMUNITY): Payer: Self-pay | Admitting: Vascular Surgery

## 2022-03-15 NOTE — Telephone Encounter (Signed)
Lvm to schedule new chf  ?

## 2022-03-16 ENCOUNTER — Ambulatory Visit (INDEPENDENT_AMBULATORY_CARE_PROVIDER_SITE_OTHER): Payer: No Typology Code available for payment source

## 2022-03-16 DIAGNOSIS — Z951 Presence of aortocoronary bypass graft: Secondary | ICD-10-CM | POA: Diagnosis not present

## 2022-03-16 DIAGNOSIS — Z5181 Encounter for therapeutic drug level monitoring: Secondary | ICD-10-CM | POA: Diagnosis not present

## 2022-03-16 DIAGNOSIS — I480 Paroxysmal atrial fibrillation: Secondary | ICD-10-CM | POA: Diagnosis not present

## 2022-03-16 DIAGNOSIS — Z7901 Long term (current) use of anticoagulants: Secondary | ICD-10-CM | POA: Diagnosis not present

## 2022-03-16 LAB — POCT INR: INR: 3 (ref 2.0–3.0)

## 2022-03-16 NOTE — Patient Instructions (Signed)
continue 1 tablet daily.  Repeat INR in 6 weeks. [phone] ?

## 2022-03-22 ENCOUNTER — Telehealth (HOSPITAL_COMMUNITY): Payer: Self-pay | Admitting: Vascular Surgery

## 2022-03-22 NOTE — Telephone Encounter (Signed)
3 rd attempt to reach pt to make NEW CHF APPT ?

## 2022-03-28 NOTE — Telephone Encounter (Signed)
Attempted phone call to pt to discuss Dr Olin Pia recommendation to have a PYP scan completed as pt did not complete cMRI.  Left voicemail message for pt to contact RN at [phone].   ?

## 2022-03-31 ENCOUNTER — Encounter: Payer: Self-pay | Admitting: Cardiovascular Disease

## 2022-03-31 NOTE — Telephone Encounter (Signed)
Spoke with pt who is agreeable to proceed with PYP scan.  Order placed.  Pt advised she will be contacted re: scheduling of test. ?

## 2022-03-31 NOTE — Addendum Note (Signed)
Addended by: Thora Lance on: 03/31/2022 04:45 PM ? ? Modules accepted: Orders ? ?

## 2022-04-03 ENCOUNTER — Emergency Department (HOSPITAL_COMMUNITY): Payer: No Typology Code available for payment source

## 2022-04-03 ENCOUNTER — Encounter (HOSPITAL_COMMUNITY): Payer: Self-pay | Admitting: Emergency Medicine

## 2022-04-03 ENCOUNTER — Other Ambulatory Visit: Payer: Self-pay

## 2022-04-03 ENCOUNTER — Inpatient Hospital Stay (HOSPITAL_COMMUNITY)
Admission: EM | Admit: 2022-04-03 | Discharge: 2022-04-06 | DRG: 291 | Disposition: A | Discharge status: Home / Self Care | Payer: No Typology Code available for payment source | Attending: Internal Medicine | Admitting: Internal Medicine

## 2022-04-03 DIAGNOSIS — Z7901 Long term (current) use of anticoagulants: Secondary | ICD-10-CM | POA: Diagnosis not present

## 2022-04-03 DIAGNOSIS — I480 Paroxysmal atrial fibrillation: Secondary | ICD-10-CM | POA: Diagnosis not present

## 2022-04-03 DIAGNOSIS — I4510 Unspecified right bundle-branch block: Secondary | ICD-10-CM | POA: Diagnosis present

## 2022-04-03 DIAGNOSIS — E669 Obesity, unspecified: Secondary | ICD-10-CM | POA: Diagnosis present

## 2022-04-03 DIAGNOSIS — I509 Heart failure, unspecified: Principal | ICD-10-CM

## 2022-04-03 DIAGNOSIS — Z8249 Family history of ischemic heart disease and other diseases of the circulatory system: Secondary | ICD-10-CM

## 2022-04-03 DIAGNOSIS — I252 Old myocardial infarction: Secondary | ICD-10-CM

## 2022-04-03 DIAGNOSIS — I2510 Atherosclerotic heart disease of native coronary artery without angina pectoris: Secondary | ICD-10-CM | POA: Diagnosis present

## 2022-04-03 DIAGNOSIS — Z881 Allergy status to other antibiotic agents status: Secondary | ICD-10-CM | POA: Diagnosis not present

## 2022-04-03 DIAGNOSIS — I5023 Acute on chronic systolic (congestive) heart failure: Secondary | ICD-10-CM | POA: Diagnosis present

## 2022-04-03 DIAGNOSIS — E66811 Obesity, class 1: Secondary | ICD-10-CM

## 2022-04-03 DIAGNOSIS — Z951 Presence of aortocoronary bypass graft: Secondary | ICD-10-CM

## 2022-04-03 DIAGNOSIS — M7989 Other specified soft tissue disorders: Secondary | ICD-10-CM | POA: Diagnosis present

## 2022-04-03 DIAGNOSIS — Z9851 Tubal ligation status: Secondary | ICD-10-CM

## 2022-04-03 DIAGNOSIS — L03116 Cellulitis of left lower limb: Secondary | ICD-10-CM | POA: Diagnosis present

## 2022-04-03 DIAGNOSIS — Z7984 Long term (current) use of oral hypoglycemic drugs: Secondary | ICD-10-CM | POA: Diagnosis not present

## 2022-04-03 DIAGNOSIS — I10 Essential (primary) hypertension: Secondary | ICD-10-CM | POA: Diagnosis not present

## 2022-04-03 DIAGNOSIS — E1169 Type 2 diabetes mellitus with other specified complication: Secondary | ICD-10-CM | POA: Diagnosis present

## 2022-04-03 DIAGNOSIS — Z6830 Body mass index (BMI) 30.0-30.9, adult: Secondary | ICD-10-CM

## 2022-04-03 DIAGNOSIS — E785 Hyperlipidemia, unspecified: Secondary | ICD-10-CM | POA: Diagnosis present

## 2022-04-03 DIAGNOSIS — N1831 Chronic kidney disease, stage 3a: Secondary | ICD-10-CM | POA: Diagnosis not present

## 2022-04-03 DIAGNOSIS — Z7982 Long term (current) use of aspirin: Secondary | ICD-10-CM | POA: Diagnosis not present

## 2022-04-03 DIAGNOSIS — E1121 Type 2 diabetes mellitus with diabetic nephropathy: Secondary | ICD-10-CM | POA: Diagnosis not present

## 2022-04-03 DIAGNOSIS — I130 Hypertensive heart and chronic kidney disease with heart failure and stage 1 through stage 4 chronic kidney disease, or unspecified chronic kidney disease: Principal | ICD-10-CM | POA: Diagnosis present

## 2022-04-03 DIAGNOSIS — E872 Acidosis, unspecified: Secondary | ICD-10-CM | POA: Diagnosis present

## 2022-04-03 DIAGNOSIS — Z886 Allergy status to analgesic agent status: Secondary | ICD-10-CM | POA: Diagnosis not present

## 2022-04-03 DIAGNOSIS — Z953 Presence of xenogenic heart valve: Secondary | ICD-10-CM | POA: Diagnosis not present

## 2022-04-03 DIAGNOSIS — I160 Hypertensive urgency: Secondary | ICD-10-CM | POA: Diagnosis present

## 2022-04-03 DIAGNOSIS — D631 Anemia in chronic kidney disease: Secondary | ICD-10-CM | POA: Diagnosis present

## 2022-04-03 DIAGNOSIS — Z20822 Contact with and (suspected) exposure to covid-19: Secondary | ICD-10-CM | POA: Diagnosis present

## 2022-04-03 DIAGNOSIS — Z888 Allergy status to other drugs, medicaments and biological substances status: Secondary | ICD-10-CM

## 2022-04-03 DIAGNOSIS — E1122 Type 2 diabetes mellitus with diabetic chronic kidney disease: Secondary | ICD-10-CM | POA: Diagnosis present

## 2022-04-03 DIAGNOSIS — Z79899 Other long term (current) drug therapy: Secondary | ICD-10-CM

## 2022-04-03 DIAGNOSIS — Z952 Presence of prosthetic heart valve: Secondary | ICD-10-CM

## 2022-04-03 LAB — COMPREHENSIVE METABOLIC PANEL
ALT: 50 U/L — ABNORMAL HIGH (ref 0–44)
AST: 43 U/L — ABNORMAL HIGH (ref 15–41)
Albumin: 3.6 g/dL (ref 3.5–5.0)
Alkaline Phosphatase: 115 U/L (ref 38–126)
Anion gap: 12 (ref 5–15)
BUN: 24 mg/dL — ABNORMAL HIGH (ref 8–23)
CO2: 18 mmol/L — ABNORMAL LOW (ref 22–32)
Calcium: 9.2 mg/dL (ref 8.9–10.3)
Chloride: 108 mmol/L (ref 98–111)
Creatinine, Ser: 1.14 mg/dL — ABNORMAL HIGH (ref 0.44–1.00)
GFR, Estimated: 53 mL/min — ABNORMAL LOW (ref >60)
Glucose, Bld: 216 mg/dL — ABNORMAL HIGH (ref 70–99)
Potassium: 4.2 mmol/L (ref 3.5–5.1)
Sodium: 138 mmol/L (ref 135–145)
Total Bilirubin: 1 mg/dL (ref 0.3–1.2)
Total Protein: 7.6 g/dL (ref 6.5–8.1)

## 2022-04-03 LAB — TROPONIN I (HIGH SENSITIVITY)
Troponin I (High Sensitivity): 53 ng/L — ABNORMAL HIGH (ref <18)
Troponin I (High Sensitivity): 53 ng/L — ABNORMAL HIGH (ref <18)

## 2022-04-03 LAB — CBC WITH DIFFERENTIAL/PLATELET
Abs Immature Granulocytes: 0.03 10*3/uL (ref 0.00–0.07)
Basophils Absolute: 0.1 10*3/uL (ref 0.0–0.1)
Basophils Relative: 1 %
Eosinophils Absolute: 0 10*3/uL (ref 0.0–0.5)
Eosinophils Relative: 1 %
HCT: 39.3 % (ref 36.0–46.0)
Hemoglobin: 11.8 g/dL — ABNORMAL LOW (ref 12.0–15.0)
Immature Granulocytes: 0 %
Lymphocytes Relative: 13 %
Lymphs Abs: 1.1 10*3/uL (ref 0.7–4.0)
MCH: 21.6 pg — ABNORMAL LOW (ref 26.0–34.0)
MCHC: 30 g/dL (ref 30.0–36.0)
MCV: 71.8 fL — ABNORMAL LOW (ref 80.0–100.0)
Monocytes Absolute: 0.6 10*3/uL (ref 0.1–1.0)
Monocytes Relative: 7 %
Neutro Abs: 6.4 10*3/uL (ref 1.7–7.7)
Neutrophils Relative %: 78 %
Platelets: 202 10*3/uL (ref 150–400)
RBC: 5.47 MIL/uL — ABNORMAL HIGH (ref 3.87–5.11)
RDW: 20.5 % — ABNORMAL HIGH (ref 11.5–15.5)
WBC: 8.2 10*3/uL (ref 4.0–10.5)
nRBC: 0 % (ref 0.0–0.2)

## 2022-04-03 LAB — RESP PANEL BY RT-PCR (FLU A&B, COVID) ARPGX2
Influenza A by PCR: NEGATIVE
Influenza B by PCR: NEGATIVE
SARS Coronavirus 2 by RT PCR: NEGATIVE

## 2022-04-03 LAB — MAGNESIUM: Magnesium: 2.1 mg/dL (ref 1.7–2.4)

## 2022-04-03 LAB — HIV ANTIBODY (ROUTINE TESTING W REFLEX): HIV Screen 4th Generation wRfx: NONREACTIVE

## 2022-04-03 LAB — GLUCOSE, CAPILLARY: Glucose-Capillary: 153 mg/dL — ABNORMAL HIGH (ref 70–99)

## 2022-04-03 LAB — PROTIME-INR
INR: 1.9 — ABNORMAL HIGH (ref 0.8–1.2)
Prothrombin Time: 21.4 seconds — ABNORMAL HIGH (ref 11.4–15.2)

## 2022-04-03 LAB — BRAIN NATRIURETIC PEPTIDE: B Natriuretic Peptide: 2733 pg/mL — ABNORMAL HIGH (ref 0.0–100.0)

## 2022-04-03 LAB — CBG MONITORING, ED
Glucose-Capillary: 154 mg/dL — ABNORMAL HIGH (ref 70–99)
Glucose-Capillary: 185 mg/dL — ABNORMAL HIGH (ref 70–99)
Glucose-Capillary: 185 mg/dL — ABNORMAL HIGH (ref 70–99)
Glucose-Capillary: 188 mg/dL — ABNORMAL HIGH (ref 70–99)

## 2022-04-03 LAB — TSH: TSH: 1.57 u[IU]/mL (ref 0.350–4.500)

## 2022-04-03 LAB — HEMOGLOBIN A1C
Hgb A1c MFr Bld: 6.7 % — ABNORMAL HIGH (ref 4.8–5.6)
Mean Plasma Glucose: 145.59 mg/dL

## 2022-04-03 MED ORDER — INSULIN ASPART 100 UNIT/ML IJ SOLN
0.0000 [IU] | Freq: Three times a day (TID) | INTRAMUSCULAR | Status: DC
  Administered 2022-04-03 – 2022-04-04 (×3): 2 [IU] via SUBCUTANEOUS
  Administered 2022-04-04: 3 [IU] via SUBCUTANEOUS
  Administered 2022-04-04: 1 [IU] via SUBCUTANEOUS
  Administered 2022-04-05: 3 [IU] via SUBCUTANEOUS
  Administered 2022-04-05: 1 [IU] via SUBCUTANEOUS
  Administered 2022-04-05 – 2022-04-06 (×2): 2 [IU] via SUBCUTANEOUS

## 2022-04-03 MED ORDER — WARFARIN SODIUM 5 MG PO TABS
5.0000 mg | ORAL_TABLET | Freq: Every day | ORAL | Status: DC
  Administered 2022-04-04: 5 mg via ORAL
  Filled 2022-04-03: qty 1

## 2022-04-03 MED ORDER — FUROSEMIDE 10 MG/ML IJ SOLN
60.0000 mg | Freq: Two times a day (BID) | INTRAMUSCULAR | Status: DC
  Administered 2022-04-03 – 2022-04-06 (×6): 60 mg via INTRAVENOUS
  Filled 2022-04-03 (×6): qty 6

## 2022-04-03 MED ORDER — CARVEDILOL 3.125 MG PO TABS
3.1250 mg | ORAL_TABLET | Freq: Two times a day (BID) | ORAL | Status: DC
  Administered 2022-04-03 – 2022-04-06 (×7): 3.125 mg via ORAL
  Filled 2022-04-03 (×7): qty 1

## 2022-04-03 MED ORDER — FUROSEMIDE 10 MG/ML IJ SOLN
80.0000 mg | Freq: Once | INTRAMUSCULAR | Status: AC
  Administered 2022-04-03: 80 mg via INTRAVENOUS
  Filled 2022-04-03: qty 8

## 2022-04-03 MED ORDER — ATORVASTATIN CALCIUM 40 MG PO TABS
40.0000 mg | ORAL_TABLET | Freq: Every evening | ORAL | Status: DC
  Administered 2022-04-03 – 2022-04-05 (×3): 40 mg via ORAL
  Filled 2022-04-03 (×3): qty 1

## 2022-04-03 MED ORDER — ACETAMINOPHEN 650 MG RE SUPP: 650.0000 mg | Freq: Four times a day (QID) | RECTAL | Status: DC | PRN

## 2022-04-03 MED ORDER — MAGNESIUM OXIDE -MG SUPPLEMENT 400 (240 MG) MG PO TABS
400.0000 mg | ORAL_TABLET | Freq: Every day | ORAL | Status: DC
  Administered 2022-04-03 – 2022-04-06 (×4): 400 mg via ORAL
  Filled 2022-04-03 (×4): qty 1

## 2022-04-03 MED ORDER — HYDRALAZINE HCL 20 MG/ML IJ SOLN
10.0000 mg | INTRAMUSCULAR | Status: DC | PRN
Start: 2022-04-03 — End: 2022-04-06

## 2022-04-03 MED ORDER — DAPAGLIFLOZIN PROPANEDIOL 10 MG PO TABS
10.0000 mg | ORAL_TABLET | Freq: Every day | ORAL | Status: DC
  Administered 2022-04-03 – 2022-04-06 (×4): 10 mg via ORAL
  Filled 2022-04-03 (×5): qty 1

## 2022-04-03 MED ORDER — WARFARIN - PHARMACIST DOSING INPATIENT
Freq: Every day | Status: DC
Start: 2022-04-03 — End: 2022-04-06

## 2022-04-03 MED ORDER — WARFARIN SODIUM 7.5 MG PO TABS
7.5000 mg | ORAL_TABLET | Freq: Once | ORAL | Status: AC
  Administered 2022-04-03: 7.5 mg via ORAL
  Filled 2022-04-03: qty 1

## 2022-04-03 MED ORDER — ASPIRIN EC 81 MG PO TBEC
81.0000 mg | DELAYED_RELEASE_TABLET | Freq: Every day | ORAL | Status: DC
Start: 2022-04-03 — End: 2022-04-06
  Administered 2022-04-03 – 2022-04-06 (×4): 81 mg via ORAL
  Filled 2022-04-03 (×4): qty 1

## 2022-04-03 MED ORDER — ACETAMINOPHEN 325 MG PO TABS
650.0000 mg | ORAL_TABLET | Freq: Four times a day (QID) | ORAL | Status: DC | PRN
  Administered 2022-04-03 – 2022-04-05 (×2): 650 mg via ORAL
  Filled 2022-04-03 (×2): qty 2

## 2022-04-03 NOTE — ED Notes (Signed)
Pt reporting increasing shortness of breath since Saturday/Sunday. Pt reports swelling in lower legs. Pt takes 80mg  lasix once daily and has been compliant. ?

## 2022-04-03 NOTE — H&P (Signed)
?History and Physical  ? ? ?Rachael Davis [id number] DOB: 1955/12/18 DOA: 04/03/2022 ? ?PCP: Donald Prose, MD  ?Patient coming from: Home. ? ?Chief Complaint: Shortness of breath. ? ?HPI: Rachael Davis is a 66 y.o. female with history of chronic systolic heart failure, diabetes mellitus, bioprosthetic mitral valve, CAD status post CABG presents to the ER because of shortness of breath.  Patient states she had traveled to [facility] yesterday to attend family members sports event and at the time missed her Lasix dose.  Denies any chest pain productive cough fever or chills.  Patient was recently placed on Keflex for left lower extremity cellulitis.  She had taken about 5 days of antibiotics. ? ?ED Course: In the ER patient was appearing mildly tachypneic.  Chest x-ray shows vascular congestion and pleural effusion.  BNP is 2700.  High sensitive troponin is 53 and 53.  Patient was given Lasix 80 mg IV and admitted for acute CHF. ? ?Review of Systems: As per HPI, rest all negative. ? ? ?Past Medical History:  ?Diagnosis Date  ? Arrhythmia   ? Hx PAF, PVC's  ? CHF (congestive heart failure) ([place])   ? Coronary artery disease   ? 04/04/2017 STEMI  ? Diabetes ([place])   ? H/O mitral valve replacement 09/21/2021  ? Heart attack ([place])   ? per pt reprot  ? Hyperlipidemia   ? Hypertension   ? New onset atrial fibrillation ([place]) 05/2017  ? ? ?Past Surgical History:  ?Procedure Laterality Date  ? CORONARY ARTERY BYPASS GRAFT N/A 04/12/2017  ? Procedure: CORONARY ARTERY BYPASS GRAFTING (CABG), ON PUMP, TIMES FOUR, USING LEFT INTERNAL MAMMARY ARTERY AND ENDOSCOPICALLY HARVESTED RIGHT GREATER SAPHENOUS VEIN;  Surgeon: Ivin Poot, MD;  Location: [place];  Service: Open Heart Surgery;  Laterality: N/A;  LIMA to LAD; SVG to OM; SVG to Ramus; SVG to Right  ? IR THORACENTESIS ASP PLEURAL SPACE W/IMG GUIDE  05/25/2017  ? LEFT HEART CATH AND CORONARY ANGIOGRAPHY N/A 04/04/2017  ? Procedure: Left Heart Cath and Coronary  Angiography;  Surgeon: Peter M Martinique, MD;  Location: [place] [facility];  Service: Cardiovascular;  Laterality: N/A;  ? MITRAL VALVE REPAIR N/A 04/12/2017  ? Procedure: MITRAL VALVE REPLACEMENT (MVR);  Surgeon: Prescott Gum, Collier Salina, MD;  Location: [place];  Service: Open Heart Surgery;  Laterality: N/A;  Using a 81mm Magna Ease Mitral Pericardial Bioprosthesis  ? TEE WITHOUT CARDIOVERSION N/A 04/12/2017  ? Procedure: TRANSESOPHAGEAL ECHOCARDIOGRAM (TEE);  Surgeon: Prescott Gum, Collier Salina, MD;  Location: [place];  Service: Open Heart Surgery;  Laterality: N/A;  ? TUBAL LIGATION    ? ? ? reports that she has never smoked. She has never used smokeless tobacco. She reports that she does not drink alcohol and does not use drugs. ? ?Allergies  ?Allergen Reactions  ? Keflex [Cephalexin] Rash  ?  Skin on feet peeled.  ? Asa [Aspirin]   ?  325 mg ----heartburn ?Pt can take 81 mg  ? Lisinopril Cough  ? ? ?Family History  ?Problem Relation Age of Onset  ? Heart attack Father 41  ? Breast cancer Maternal Grandmother   ? ? ?Prior to Admission medications   ?Medication Sig Start Date End Date Taking? Authorizing Provider  ?acetaminophen (TYLENOL) 325 MG tablet Take 2 tablets (650 mg total) by mouth every 6 (six) hours as needed for mild pain or headache. 05/28/17  Yes Lars Pinks M, PA-C  ?aspirin EC 81 MG tablet Take 81 mg by  mouth daily.   Yes [provider]  ?atorvastatin (LIPITOR) 40 MG tablet TAKE 1 TABLET (40 MG TOTAL) BY MOUTH DAILY AT 6 PM. ?Patient taking differently: Take 40 mg by mouth every evening. 07/29/21  Yes Chilton Si, MD  ?carvedilol (COREG) 3.125 MG tablet TAKE 1 TABLET BY MOUTH 2 TIMES DAILY. ?Patient taking differently: Take 3.125 mg by mouth 2 (two) times daily with a meal. 03/08/22  Yes Chilton Si, MD  ?cephALEXin (KEFLEX) 500 MG capsule Take 500 mg by mouth See admin instructions. Tid x 10 days 03/20/22  Yes [provider]  ?dapagliflozin propanediol (FARXIGA) 10 MG TABS tablet Take  10 mg by mouth daily.   Yes [provider]  ?furosemide (LASIX) 80 MG tablet Take 1 tablet (80 mg total) by mouth daily. 02/06/22  Yes Duke Salvia, MD  ?glipiZIDE (GLUCOTROL XL) 10 MG 24 hr tablet Take 1 tablet (10 mg total) by mouth daily with breakfast. 05/28/17  Yes Doree Fudge M, PA-C  ?JANUVIA 100 MG tablet Take 100 mg by mouth daily. Patient has not started yet 03/14/19  Yes [provider]  ?magnesium oxide (MAG-OX) 400 MG tablet TAKE 1 TABLET BY MOUTH EVERY DAY ?Patient taking differently: Take 400 mg by mouth daily. 10/01/20  Yes Chilton Si, MD  ?metFORMIN (GLUCOPHAGE) 1000 MG tablet Take 1,000 mg by mouth 2 (two) times daily with a meal.   Yes [provider]  ?warfarin (COUMADIN) 5 MG tablet TAKE 1 TO 1 AND 1/2 TABLETS BY MOUTH DAILY AS DIRECTED BY THE COUMADIN CLIN ?Patient taking differently: Take 5 mg by mouth daily. 03/06/22  Yes Chilton Si, MD  ? ? ?Physical Exam: ?Constitutional: Moderately built and nourished. ?Vitals:  ? 04/03/22 0307 04/03/22 0404 04/03/22 0509 04/03/22 0600  ?BP:  (!) 170/113 (!) 167/97 (!) 151/95  ?Pulse:  94 88 90  ?Resp:  (!) 32 (!) 23 (!) 25  ?Temp:  97.7 ?F (36.5 ?C)    ?TempSrc:      ?SpO2:  98% 98% 100%  ?Weight: 81.6 kg     ?Height: 5\' 4"  (1.626 m)     ? ?Eyes: Anicteric no pallor. ?ENMT: No discharge from the ears eyes nose and mouth. ?Neck: No mass felt.  No neck rigidity.  JVD elevated. ?Respiratory: No rhonchi or crepitations. ?Cardiovascular: S1-S2 heard. ?Abdomen: Soft nontender bowel sound present. ?Musculoskeletal: Bilateral lower extremity edema present. ?Skin: Rash on the left leg. ?Neurologic: Alert awake oriented time place and person.  Moves all extremities. ?Psychiatric: Appears normal.  Normal affect. ? ? ?Labs on Admission: I have personally reviewed following labs and imaging studies ? ?CBC: ?Recent Labs  ?Lab 04/03/22 ?0311  ?WBC 8.2  ?NEUTROABS 6.4  ?HGB 11.8*  ?HCT 39.3  ?MCV 71.8*  ?PLT 202  ? ?Basic  Metabolic Panel: ?Recent Labs  ?Lab 04/03/22 ?0311  ?NA 138  ?K 4.2  ?CL 108  ?CO2 18*  ?GLUCOSE 216*  ?BUN 24*  ?CREATININE 1.14*  ?CALCIUM 9.2  ? ?GFR: ?Estimated Creatinine Clearance: 50.2 mL/min (A) (by C-G formula based on SCr of 1.14 mg/dL (H)). ?Liver Function Tests: ?Recent Labs  ?Lab 04/03/22 ?0311  ?AST 43*  ?ALT 50*  ?ALKPHOS 115  ?BILITOT 1.0  ?PROT 7.6  ?ALBUMIN 3.6  ? ?No results for input(s): LIPASE, AMYLASE in the last 168 hours. ?No results for input(s): AMMONIA in the last 168 hours. ?Coagulation Profile: ?Recent Labs  ?Lab 04/03/22 ?2449  ?INR 1.9*  ? ?Cardiac Enzymes: ?No results for input(s):  CKTOTAL, CKMB, CKMBINDEX, TROPONINI in the last 168 hours. ?BNP (last 3 results) ?No results for input(s): PROBNP in the last 8760 hours. ?HbA1C: ?No results for input(s): HGBA1C in the last 72 hours. ?CBG: ?No results for input(s): GLUCAP in the last 168 hours. ?Lipid Profile: ?No results for input(s): CHOL, HDL, LDLCALC, TRIG, CHOLHDL, LDLDIRECT in the last 72 hours. ?Thyroid Function Tests: ?No results for input(s): TSH, T4TOTAL, FREET4, T3FREE, THYROIDAB in the last 72 hours. ?Anemia Panel: ?No results for input(s): VITAMINB12, FOLATE, FERRITIN, TIBC, IRON, RETICCTPCT in the last 72 hours. ?Urine analysis: ?   ?Component Value Date/Time  ? [place] 04/15/2017 1229  ? APPEARANCEUR HAZY (A) 04/15/2017 1229  ? LABSPEC 1.010 04/15/2017 1229  ? PHURINE 5.0 04/15/2017 1229  ? GLUCOSEU NEGATIVE 04/15/2017 1229  ? HGBUR SMALL (A) 04/15/2017 1229  ? [place] NEGATIVE 04/15/2017 1229  ? [place] NEGATIVE 04/15/2017 1229  ? PROTEINUR NEGATIVE 04/15/2017 1229  ? NITRITE NEGATIVE 04/15/2017 1229  ? LEUKOCYTESUR NEGATIVE 04/15/2017 1229  ? ?Sepsis Labs: ?@[email or web address] (procalcitonin:4,lacticidven:4) ?) ?Recent Results (from the past 240 hour(s))  ?Resp Panel by RT-PCR (Flu A&B, Covid) Nasopharyngeal Swab     Status: None  ? Collection Time: 04/03/22  3:23 AM  ? Specimen: Nasopharyngeal Swab;  Nasopharyngeal(NP) swabs in vial transport medium  ?Result Value Ref Range Status  ? SARS Coronavirus 2 by RT PCR NEGATIVE NEGATIVE Final  ?  Comment: (NOTE) ?SARS-CoV-2 target nucleic acids are NOT DETECTED. ? ?The SARS-C

## 2022-04-03 NOTE — Progress Notes (Signed)
Heart Failure Navigator Progress Note  Following this hospitalization to assess for HV TOC readiness.   Echo?  Corry Storie, BSN, RN Heart Failure Nurse Navigator Secure Chat Only  

## 2022-04-03 NOTE — ED Notes (Signed)
Pt given crackers. Meal tray ordered.  ?

## 2022-04-03 NOTE — Plan of Care (Signed)
Patient seen and rounded on this morning.  Admitted same day. ?Presented with worsening shortness of breath at home and lower extremity edema.  She was not entirely sure of her typical weight and does not routinely weigh herself at home.  She was found to have vascular congestion and pleural effusion on CXR as well as significantly elevated BNP.  Troponin indeterminately elevated but no significant delta. ?She was admitted for CHF exacerbation and started on IV Lasix. ? ?Patient was in no distress when seen and breathing well on room air.  She has been started on Lasix 60 mg IV twice daily which seems appropriate. ?No other major changes to A&P from admission.  See H&P for further problem-based plans. ? ?Lewie Chamber, MD ?[facility] ?04/03/2022, 2:38 PM ?

## 2022-04-03 NOTE — ED Triage Notes (Signed)
Pt c/o SOB with minor chest tightness x 1 day ?

## 2022-04-03 NOTE — ED Provider Triage Note (Signed)
Emergency Medicine Provider Triage Evaluation Note ? ?Rachael Davis , a 66 y.o. female  was evaluated in triage.  Pt complains of dyspnea since yesterday AM with associated chest tightness, cough, and leg swelling. Denies fever or hemoptysis. ? ?Hx of CHF, CAD, afib, HTN, hyperlipidemia  ? ?Review of Systems  ?Positive: Chest pain, dyspnea, cough, leg swelling ?Negative: Fever, vomiting, hemoptysis, syncope ? ?Physical Exam  ?BP (!) 153/100   Pulse 93   Temp (!) 96.5 ?F (35.8 ?C) (Oral)   Resp (!) 22   Ht 5\' 4"  (1.626 m)   Wt 81.6 kg   SpO2 100%   BMI 30.90 kg/m?  ---> rechecked temp 98.5 ? ?Gen:   Awake, no distress   ?Resp:  Normal effort  ?MSK:   LE edema present.  ? ?Medical Decision Making  ?Medically screening exam initiated at 2:46 AM.  Appropriate orders placed.  Rachael Davis was informed that the remainder of the evaluation will be completed by another provider, this initial triage assessment does not replace that evaluation, and the importance of remaining in the ED until their evaluation is complete. ? ?Dyspnea ?  ?[name], PA-C ?04/03/22 [id number] ? ?

## 2022-04-03 NOTE — ED Provider Notes (Signed)
? ?[facility]  ?Provider Note ? ?CSN: [id number] ?Arrival date & time: 04/03/22 0239 ? ?History ?Chief Complaint  ?Patient presents with  ? Shortness of Breath  ? ? ?Rachael Davis is a 66 y.o. female with history of CAD s/p CABG, HFrEF on Lasix reports about 24 hours of increasing SOB, DOE and leg swelling. She is unsure if she has been gaining weight. She has had some dry cough and chest tightness. She has been taking her medications. Saw Dr. Caryl Comes for consideration of ICD placement in early March but referred to [facility] for optimization first. He was also concerned for possible cardiac amyloid and sent her to MRI however she was unable to complete the study.  ? ? ?Home Medications ?Prior to Admission medications   ?Medication Sig Start Date End Date Taking? Authorizing Provider  ?acetaminophen (TYLENOL) 325 MG tablet Take 2 tablets (650 mg total) by mouth every 6 (six) hours as needed for mild pain or headache. 05/28/17   Nani Skillern, PA-C  ?aspirin EC 81 MG tablet Take 81 mg by mouth daily.    [provider]  ?atorvastatin (LIPITOR) 40 MG tablet TAKE 1 TABLET (40 MG TOTAL) BY MOUTH DAILY AT 6 PM. 07/29/21   Skeet Latch, MD  ?carvedilol (COREG) 3.125 MG tablet TAKE 1 TABLET BY MOUTH 2 TIMES DAILY. 03/08/22   Skeet Latch, MD  ?cephALEXin (KEFLEX) 500 MG capsule Take 500 mg by mouth See admin instructions. Tid x 10 days 03/20/22   [provider]  ?dapagliflozin propanediol (FARXIGA) 10 MG TABS tablet Take 10 mg by mouth daily.    [provider]  ?furosemide (LASIX) 80 MG tablet Take 1 tablet (80 mg total) by mouth daily. 02/06/22   Deboraha Sprang, MD  ?glipiZIDE (GLUCOTROL XL) 10 MG 24 hr tablet Take 1 tablet (10 mg total) by mouth daily with breakfast. 05/28/17   Lars Pinks M, PA-C  ?JANUVIA 100 MG tablet Take 100 mg by mouth daily. Patient has not started yet 03/14/19   [provider]  ?magnesium oxide (MAG-OX)  400 MG tablet TAKE 1 TABLET BY MOUTH EVERY DAY 10/01/20   Skeet Latch, MD  ?metFORMIN (GLUCOPHAGE) 1000 MG tablet Take 1,000 mg by mouth 2 (two) times daily with a meal.    [provider]  ?warfarin (COUMADIN) 5 MG tablet TAKE 1 TO 1 AND 1/2 TABLETS BY MOUTH DAILY AS DIRECTED BY THE COUMADIN CLIN 03/06/22   Skeet Latch, MD  ? ? ? ?Allergies    ?Keflex [cephalexin], Asa [aspirin], and Lisinopril ? ? ?Review of Systems   ?Review of Systems ?Please see HPI for pertinent positives and negatives ? ?Physical Exam ?BP (!) 167/97   Pulse 88   Temp 97.7 ?F (36.5 ?C)   Resp (!) 23   Ht 5\' 4"  (1.626 m)   Wt 81.6 kg   SpO2 98%   BMI 30.90 kg/m?  ? ?Physical Exam ?Vitals and nursing note reviewed.  ?Constitutional:   ?   Appearance: Normal appearance.  ?HENT:  ?   Head: Normocephalic and atraumatic.  ?   Nose: Nose normal.  ?   Mouth/Throat:  ?   Mouth: Mucous membranes are moist.  ?Eyes:  ?   Extraocular Movements: Extraocular movements intact.  ?   Conjunctiva/sclera: Conjunctivae normal.  ?Cardiovascular:  ?   Rate and Rhythm: Normal rate.  ?Pulmonary:  ?   Effort: Pulmonary effort is normal.  ?   Breath sounds:  Examination of the right-lower field reveals rales. Examination of the left-lower field reveals rales. Rales present.  ?Abdominal:  ?   General: Abdomen is flat.  ?   Palpations: Abdomen is soft.  ?   Tenderness: There is no abdominal tenderness.  ?Musculoskeletal:     ?   General: No swelling. Normal range of motion.  ?   Cervical back: Neck supple.  ?   Right lower leg: Edema present.  ?   Left lower leg: Edema present.  ?Skin: ?   General: Skin is warm and dry.  ?Neurological:  ?   General: No focal deficit present.  ?   Mental Status: She is alert.  ?Psychiatric:     ?   Mood and Affect: Mood normal.  ? ? ?ED Results / Procedures / Treatments   ?EKG ?EKG Interpretation ? ?Date/Time:  Monday Apr 03 2022 02:57:24 EDT ?Ventricular Rate:  86 ?PR Interval:  248 ?QRS Duration: 124 ?QT  Interval:  414 ?QTC Calculation: 495 ?R Axis:   -82 ?Text Interpretation: Sinus rhythm with 1st degree A-V block with occasional Premature ventricular complexes Left axis deviation Right bundle branch block Inferior infarct , age undetermined Abnormal ECG When compared with ECG of 06-February-2022 No significant change since last tracing Confirmed by Calvert Cantor [phone]) on 04/03/2022 4:34:21 AM ? ?Procedures ?Procedures ? ?Medications Ordered in the ED ?Medications  ?furosemide (LASIX) injection 80 mg (has no administration in time range)  ? ? ?Initial Impression and Plan ? Patient with known CHF here with symptoms consistent with exacerbation. Will check a weight today for tracking. Labs done in triage show elevated BNP and mildly elevated Trop. CMP with CKD at baseline. CBC with mild anemia. I personally viewed the images from radiology studies and agree with radiologist interpretation: CXR with vascular congestion, no overt edema. Will begin Lasix and plan admission for diuresis.  ? ? ?ED Course  ? ?Clinical Course as of 04/03/22 0512  ?Mon Apr 03, 2022  ?[place] with Dr. Hal Hope, Hospitalist, who will evaluate for admission.  [CS]  ?  ?Clinical Course User Index ?[CS] Truddie Hidden, MD  ? ? ? ?MDM Rules/Calculators/A&P ?Medical Decision Making ?Problems Addressed: ?Acute on chronic congestive heart failure, unspecified heart failure type [facility]): chronic illness or injury with exacerbation, progression, or side effects of treatment ? ?Amount and/or Complexity of Data Reviewed ?Labs: ordered. Decision-making details documented in ED Course. ?Radiology: ordered and independent interpretation performed. Decision-making details documented in ED Course. ?ECG/medicine tests: ordered and independent interpretation performed. Decision-making details documented in ED Course. ? ?Risk ?Prescription drug management. ?Decision regarding hospitalization. ? ? ? ?Final Clinical Impression(s) / ED Diagnoses ?Final  diagnoses:  ?Acute on chronic congestive heart failure, unspecified heart failure type ([place])  ? ? ?Rx / DC Orders ?ED Discharge Orders   ? ? None  ? ?  ? ?  ?Truddie Hidden, MD ?04/03/22 [phone] ? ?

## 2022-04-03 NOTE — Progress Notes (Signed)
ANTICOAGULATION CONSULT NOTE - Initial Consult ? ?Pharmacy Consult for Coumadin ?Indication: atrial fibrillation ? ?Allergies  ?Allergen Reactions  ? Keflex [Cephalexin] Rash  ?  Skin on feet peeled.  ? Asa [Aspirin]   ?  325 mg ----heartburn ?Pt can take 81 mg  ? Lisinopril Cough  ? ? ?Patient Measurements: ?Height: 5\' 4"  (162.6 cm) ?Weight: 81.6 kg (180 lb) ?IBW/kg (Calculated) : 54.7 ? ?Vital Signs: ?Temp: 97.7 ?F (36.5 ?C) (05/01 0404) ?Temp Source: Oral (05/01 0246) ?BP: 151/95 (05/01 0600) ?Pulse Rate: 90 (05/01 0600) ? ?Labs: ?Recent Labs  ?  04/03/22 ?0311 04/03/22 ?06/03/22  ?HGB 11.8*  --   ?HCT 39.3  --   ?PLT 202  --   ?LABPROT  --  21.4*  ?INR  --  1.9*  ?CREATININE 1.14*  --   ?TROPONINIHS 53*  --   ? ? ?Estimated Creatinine Clearance: 50.2 mL/min (A) (by C-G formula based on SCr of 1.14 mg/dL (H)). ? ? ?Medical History: ?Past Medical History:  ?Diagnosis Date  ? Arrhythmia   ? Hx PAF, PVC's  ? CHF (congestive heart failure) (HCC)   ? Coronary artery disease   ? 04/04/2017 STEMI  ? Diabetes (HCC)   ? H/O mitral valve replacement 09/21/2021  ? Heart attack (HCC)   ? per pt reprot  ? Hyperlipidemia   ? Hypertension   ? New onset atrial fibrillation (HCC) 05/2017  ? ? ?Assessment: ?66yo female c/o minor chest tightness, admitted for acute on chronic CHF, to continue Coumadin for Afib; current INR just below goal, last dose taken 4/30. ? ?Goal of Therapy:  ?INR 2-3 ?  ?Plan:  ?Coumadin 7.5mg  po x1 today and then resume home dose of 5mg  daily. ?Monitor INR. ? ?5/30, PharmD, BCPS  ?04/03/2022,6:24 AM ? ? ?

## 2022-04-03 NOTE — ED Notes (Signed)
Pt reporting pain in right rip cage area. PRN tylenol to be given.  ?

## 2022-04-04 DIAGNOSIS — E669 Obesity, unspecified: Secondary | ICD-10-CM

## 2022-04-04 DIAGNOSIS — D631 Anemia in chronic kidney disease: Secondary | ICD-10-CM

## 2022-04-04 DIAGNOSIS — I5023 Acute on chronic systolic (congestive) heart failure: Secondary | ICD-10-CM | POA: Diagnosis not present

## 2022-04-04 DIAGNOSIS — N1831 Chronic kidney disease, stage 3a: Secondary | ICD-10-CM | POA: Diagnosis present

## 2022-04-04 LAB — CBC WITH DIFFERENTIAL/PLATELET
Abs Immature Granulocytes: 0.03 10*3/uL (ref 0.00–0.07)
Basophils Absolute: 0.1 10*3/uL (ref 0.0–0.1)
Basophils Relative: 1 %
Eosinophils Absolute: 0.1 10*3/uL (ref 0.0–0.5)
Eosinophils Relative: 2 %
HCT: 40.7 % (ref 36.0–46.0)
Hemoglobin: 12 g/dL (ref 12.0–15.0)
Immature Granulocytes: 0 %
Lymphocytes Relative: 23 %
Lymphs Abs: 1.9 10*3/uL (ref 0.7–4.0)
MCH: 20.9 pg — ABNORMAL LOW (ref 26.0–34.0)
MCHC: 29.5 g/dL — ABNORMAL LOW (ref 30.0–36.0)
MCV: 71 fL — ABNORMAL LOW (ref 80.0–100.0)
Monocytes Absolute: 0.6 10*3/uL (ref 0.1–1.0)
Monocytes Relative: 7 %
Neutro Abs: 5.6 10*3/uL (ref 1.7–7.7)
Neutrophils Relative %: 67 %
Platelets: 184 10*3/uL (ref 150–400)
RBC: 5.73 MIL/uL — ABNORMAL HIGH (ref 3.87–5.11)
RDW: 20.6 % — ABNORMAL HIGH (ref 11.5–15.5)
WBC: 8.2 10*3/uL (ref 4.0–10.5)
nRBC: 0 % (ref 0.0–0.2)

## 2022-04-04 LAB — GLUCOSE, CAPILLARY
Glucose-Capillary: 136 mg/dL — ABNORMAL HIGH (ref 70–99)
Glucose-Capillary: 202 mg/dL — ABNORMAL HIGH (ref 70–99)
Glucose-Capillary: 193 mg/dL — ABNORMAL HIGH (ref 70–99)
Glucose-Capillary: 216 mg/dL — ABNORMAL HIGH (ref 70–99)

## 2022-04-04 LAB — BASIC METABOLIC PANEL
Anion gap: 11 (ref 5–15)
BUN: 24 mg/dL — ABNORMAL HIGH (ref 8–23)
CO2: 24 mmol/L (ref 22–32)
Calcium: 9.1 mg/dL (ref 8.9–10.3)
Chloride: 103 mmol/L (ref 98–111)
Creatinine, Ser: 1.38 mg/dL — ABNORMAL HIGH (ref 0.44–1.00)
GFR, Estimated: 42 mL/min — ABNORMAL LOW (ref >60)
Glucose, Bld: 120 mg/dL — ABNORMAL HIGH (ref 70–99)
Potassium: 3.6 mmol/L (ref 3.5–5.1)
Sodium: 138 mmol/L (ref 135–145)

## 2022-04-04 LAB — PROTIME-INR
INR: 2.1 — ABNORMAL HIGH (ref 0.8–1.2)
Prothrombin Time: 23.7 seconds — ABNORMAL HIGH (ref 11.4–15.2)

## 2022-04-04 LAB — MAGNESIUM: Magnesium: 2.1 mg/dL (ref 1.7–2.4)

## 2022-04-04 NOTE — Hospital Course (Addendum)
Rachael Davis was admitted to the hospital with the working diagnosis of decompensated heart failure.  ? ?66 y.o. female with history of chronic systolic heart failure, diabetes mellitus, bioprosthetic mitral valve, CAD status post CABG presents to the ER because of shortness of breath.  Patient states she had traveled to [facility] 4/30 to attend family members sports event and at the time missed her Lasix dose. Patient was recently placed on Keflex for left lower extremity cellulitis.  She had taken about 5 days of antibiotics. ?In her initial physical examination patient was appearing mildly tachypneic. Blood pressure 170/113, HR 94, RR 32 02 saturation 98%. Lungs with no rhonchi or rales, positive JVD, heart with S1 and S2 present with no gallops, abdomen not distended and positive lower extremity edema.  ? ?Na 138, K 4,2 Cl 108, bicarbonate 18, glucose 216 bun 24 cr 1,14  ?High sensitive troponin 53, 53 ?BNP 2,733  ?Wbc 8,2 hgb 11,8 plt 202  ?Sars covid 19 negative  ? ?Chest radiograph with cardiomegaly, positive bilateral hilar vascular congestion, small bilateral pleural effusions.  ? ?EKG 86 bpm, left axis deviation, right bundle branch block, sinus rhythm with 1st degree block, q wave lead II, III, AvF, with no significant ST segment or T wave changes.  ? ?Echocardiogram with reduced LV systolic function 25 to 30%.  ? ?Patient was placed on furosemide for diuresis with improvement in volume status.  ? ?

## 2022-04-04 NOTE — Plan of Care (Signed)
  Problem: Education: Goal: Ability to verbalize understanding of medication therapies will improve Outcome: Progressing   

## 2022-04-04 NOTE — Progress Notes (Signed)
Heart Failure Navigator Progress Note ? ?Assessed for Heart & Vascular TOC clinic readiness.  ?Patient does not meet criteria due to will be going back into see Advanced Heart Failure Team per Dr. Duke Salvia.  ? ? ? ?Rhae Hammock, BSN, RN ?Heart Failure Nurse Navigator ?Secure Chat Only   ?

## 2022-04-04 NOTE — Progress Notes (Signed)
ANTICOAGULATION CONSULT NOTE ? ?Pharmacy Consult for Coumadin ?Indication: atrial fibrillation ? ?Allergies  ?Allergen Reactions  ? Keflex [Cephalexin] Rash  ?  Skin on feet peeled.  ? Asa [Aspirin]   ?  325 mg ----heartburn ?Pt can take 81 mg  ? Lisinopril Cough  ? ? ?Patient Measurements: ?Height: 5\' 3"  (160 cm) ?Weight: 77.4 kg (170 lb 9.6 oz) ?IBW/kg (Calculated) : 52.4 ? ?Vital Signs: ?Temp: 98.2 ?F (36.8 ?C) (05/02 [id number]) ?Temp Source: Oral (05/02 [id number]) ?BP: 138/91 (05/02 0513) ?Pulse Rate: 74 (05/02 0513) ? ?Labs: ?Recent Labs  ?  04/03/22 ?0311 04/03/22 ?[id number] 04/04/22 ?0446  ?HGB 11.8*  --  12.0  ?HCT 39.3  --  40.7  ?PLT 202  --  184  ?LABPROT  --  21.4* 23.7*  ?INR  --  1.9* 2.1*  ?CREATININE 1.14*  --  1.38*  ?TROPONINIHS 53* 53*  --   ? ? ? ?Estimated Creatinine Clearance: 39.5 mL/min (A) (by C-G formula based on SCr of 1.38 mg/dL (H)). ? ? ?Assessment: ?66yo female c/o minor chest tightness, admitted for acute on chronic CHF, to continue Coumadin for Afib; INR just below goal on admit, last dose taken 4/30. ? ?INR is therapeutic at 2.1 today. No bleeding noted, CBC is stable.  ? ?Goal of Therapy:  ?INR 2-3 ?  ?Plan:  ?Coumadin 5 mg PO daily ?Monitor daily INR ?Monitor for s/sx of bleeding ? ?Thank you for involving pharmacy in this patient's care. ? ?Renold Genta, PharmD, BCPS ?Clinical Pharmacist ?Clinical phone for 04/04/2022 until 3p is [phone] ?04/04/2022 7:38 AM ? ?**Pharmacist phone directory can be found on [email or web address] listed under [place]** ? ? ?

## 2022-04-04 NOTE — Progress Notes (Signed)
?PROGRESS NOTE ?Rachael Davis  [id number] DOB: 04-Jan-1956 DOA: 04/03/2022 ?PCP: Donald Prose, MD  ? ?Brief Narrative/Hospital Course: ?66 y.o. female with history of chronic systolic heart failure, diabetes mellitus, bioprosthetic mitral valve, CAD status post CABG presents to the ER because of shortness of breath.  Patient states she had traveled to [facility] 4/30 to attend family members sports event and at the time missed her Lasix dose. Patient was recently placed on Keflex for left lower extremity cellulitis.  She had taken about 5 days of antibiotics. ?ED Course: In the ER patient was appearing mildly tachypneic.  Chest x-ray shows vascular congestion and pleural effusion.  BNP is 2700.  High sensitive troponin is 53 and 53.  Patient was given Lasix 80 mg IV and admitted for acute CHF, HTN urgerncy.  ?  ?Subjective: ?Seen and examined.  Resting in the bedside chair, leg edema is much better shortness of breath is overall improvement. ?She is on room air. ?Total output 2 L ? ?Assessment and Plan: ?Principal Problem: ?  Acute on chronic systolic CHF (congestive heart failure) ([place]) ?Active Problems: ?  Essential hypertension ?  Diabetes mellitus with nephropathy ([place]) ?  S/P CABG x 4 ?  PAF (paroxysmal atrial fibrillation) ([place]) ?  Chronic kidney disease, stage 3a ([place]) ?  H/O mitral valve replacement ?  Acute CHF (congestive heart failure) ([place]) ?  Obesity, Class I, BMI 30-34.9 ?  Anemia of chronic kidney failure ? ?Acute on chronic systolic [id number] 12/2021.  Clinically improving lymphedema much better continue 60 Lasix every 12, Coreg, Farxiga.  Monitor intake output Daily weight, keep on low-salt diet.  Outpatient cardiology follow-up ?Net IO Since Admission: -2,380 mL [04/04/22 1108]  ?Filed Weights  ? 04/03/22 1311 04/03/22 2214 04/04/22 0438  ?Weight: 78.7 kg 77.9 kg 77.4 kg  ?  ?Hypertension urgency ?Essential hypertension: ?Poorly controlled BP on admission, now overall  much improved continue Coreg, Lasix.  Recently Aldactone and losartan were discontinued by cardiology ? ?Diabetes mellitus with nephropathy : A1c at 6.7, blood sugar controlLED Cont farxiga, ssi. ?Recent Labs  ?Lab 04/03/22 ?0724 04/03/22 ?0727 04/03/22 ?1129 04/03/22 ?1817 04/03/22 ?1924 04/03/22 ?2233 04/04/22 ?[id number]  ?GLUCAP  --    < > 185* 185* 188* 153* 136*  ?HGBA1C 6.7*  --   --   --   --   --   --   ? < > = values in this interval not displayed.  ?  ?CAD S/P CABG x 4: Flat troponin, no chest pain.  Continue her aspirin Lipitor Coreg ? ?PAF: NSR. Cont Coreg and anticoagulation w/ Coumadin, pharmacy dosing,INR therapeutic ? ?H/O mitral valve replacement-stable ? ?Anemia likely from chronic renal disease follow CBC.  Hb stable at 11 to 12 g. ?CKD IIIA: Renal function at baseline.  Monitor closely ? ?Recently treated lower extremity cellulitis, no evidence of infection currently ?Class I Obesity:Patient's Body mass index is 30.22 kg/m?. : Will benefit with PCP follow-up, weight loss  healthy lifestyle and outpatient sleep evaluation. ? ? ?DVT prophylaxis: Warfarin ?Code Status:   Code Status: Full Code ?Family Communication: plan of care discussed with patient at bedside. ?Patient status is: Inpatient level of care: Telemetry Cardiac  ?Remains inpatient because: Ongoing IV Lasix ?Patient currently not stable ? ?Dispo: The patient is from: Home ?           Anticipated disposition: Anticipating discharge tomorrow and ? ?Mobility Assessment (last 72 hours)   ? ? Mobility Assessment   ? ?  [place] Name 04/04/22 0037  ?  ?  ?  ?  ? Does patient have an order for bedrest or is patient medically unstable No - Continue assessment      ? What is the highest level of mobility based on the progressive mobility assessment? Level 6 (Walks independently in room and hall) - Balance while walking in room without assist - Complete      ? ?  ?  ? ?  ?  ? ?Objective: ?Vitals last 24 hrs: ?Vitals:  ? 04/04/22 [id number] 04/04/22 0442 04/04/22  0513 04/04/22 0833  ?BP:   (!) 138/91 122/72  ?Pulse:   74 88  ?Resp:   20 18  ?Temp:  98 ?F (36.7 ?C) 98.2 ?F (36.8 ?C) 98.1 ?F (36.7 ?C)  ?TempSrc:  Oral Oral Oral  ?SpO2:  100% 99% 97%  ?Weight: 77.4 kg     ?Height:      ? ?Weight change: -2.948 kg ? ?Physical Examination: ?General exam: AA,older than stated age, weak appearing. ?HEENT:Oral mucosa moist, Ear/Nose WNL grossly, dentition normal. ?Respiratory system: bilaterally diminished BS, no use of accessory muscle ?Cardiovascular system: S1 & S2 +, No JVD,. ?Gastrointestinal system: Abdomen soft,NT,ND, BS+ ?Nervous System:Alert, awake, moving extremities and grossly nonfocal ?Extremities: LE edema  ++,distal peripheral pulses palpable.  ?Skin: No rashes,no icterus. ?MSK: Normal muscle bulk,tone, power ? ?Medications reviewed:  ?Scheduled Meds: ? aspirin EC  81 mg Oral Daily  ? atorvastatin  40 mg Oral QPM  ? carvedilol  3.125 mg Oral BID WC  ? dapagliflozin propanediol  10 mg Oral Daily  ? furosemide  60 mg Intravenous Q12H  ? insulin aspart  0-9 Units Subcutaneous TID WC  ? magnesium oxide  400 mg Oral Daily  ? warfarin  5 mg Oral q1600  ? Warfarin - Pharmacist Dosing Inpatient   Does not apply [id number]  ? ?Continuous Infusions: ? ?  ?Diet Order   ? ?       ?  Diet heart healthy/carb modified Room service appropriate? Yes; Fluid consistency: Thin; Fluid restriction: 1200 mL Fluid  Diet effective now       ?  ? ?  ?  ? ?  ?  ? ?  ?  ?  ? ? ?Intake/Output Summary (Last 24 hours) at 04/04/2022 1108 ?Last data filed at 04/04/2022 0900 ?Gross per 24 hour  ?Intake 120 ml  ?Output 1000 ml  ?Net -880 ml  ? ?Net IO Since Admission: -2,380 mL [04/04/22 1108]  ?Wt Readings from Last 3 Encounters:  ?04/04/22 77.4 kg  ?02/06/22 81.6 kg  ?02/02/22 81.6 kg  ?  ? ?Unresulted Labs (From admission, onward)  ? ?  Start     Ordered  ? 04/04/22 0500  Protime-INR  Daily,   R     ? 04/03/22 0625  ? 04/04/22 [id number]  Basic metabolic panel  Daily,   R     ?Question:  Specimen collection method   Answer:  Lab=Lab collect  ? 04/03/22 1533  ? 04/04/22 0500  CBC with Differential/Platelet  Daily,   R     ?Question:  Specimen collection method  Answer:  Lab=Lab collect  ? 04/03/22 1533  ? 04/04/22 0500  Magnesium  Daily,   R     ?Question:  Specimen collection method  Answer:  Lab=Lab collect  ? 04/03/22 1533  ? ?  ?  ? ?  ?Data Reviewed: I have personally reviewed following labs and imaging studies ?CBC: ?Recent Labs  ?  Lab 04/03/22 ?0311 04/04/22 ?0446  ?WBC 8.2 8.2  ?NEUTROABS 6.4 5.6  ?HGB 11.8* 12.0  ?HCT 39.3 40.7  ?MCV 71.8* 71.0*  ?PLT 202 184  ? ?Basic Metabolic Panel: ?Recent Labs  ?Lab 04/03/22 ?0311 04/03/22 ?[id number] 04/04/22 ?0446  ?NA 138  --  138  ?K 4.2  --  3.6  ?CL 108  --  103  ?CO2 18*  --  24  ?GLUCOSE 216*  --  120*  ?BUN 24*  --  24*  ?CREATININE 1.14*  --  1.38*  ?CALCIUM 9.2  --  9.1  ?MG  --  2.1 2.1  ? ?GFR: ?Estimated Creatinine Clearance: 39.5 mL/min (A) (by C-G formula based on SCr of 1.38 mg/dL (H)). ?Liver Function Tests: ?Recent Labs  ?Lab 04/03/22 ?0311  ?AST 43*  ?ALT 50*  ?ALKPHOS 115  ?BILITOT 1.0  ?PROT 7.6  ?ALBUMIN 3.6  ? ?No results for input(s): LIPASE, AMYLASE in the last 168 hours. ?No results for input(s): AMMONIA in the last 168 hours. ?Coagulation Profile: ?Recent Labs  ?Lab 04/03/22 ?[id number] 04/04/22 ?0446  ?INR 1.9* 2.1*  ? ?BNP (last 3 results) ?No results for input(s): PROBNP in the last 8760 hours. ?HbA1C: ?Recent Labs  ?  04/03/22 ?0724  ?HGBA1C 6.7*  ? ?CBG: ?Recent Labs  ?Lab 04/03/22 ?1129 04/03/22 ?1817 04/03/22 ?1924 04/03/22 ?2233 04/04/22 ?[id number]  ?GLUCAP 185* 185* 188* 153* 136*  ? ?Lipid Profile: ?No results for input(s): CHOL, HDL, LDLCALC, TRIG, CHOLHDL, LDLDIRECT in the last 72 hours. ?Thyroid Function Tests: ?Recent Labs  ?  04/03/22 ?0723  ?TSH 1.570  ? ?Sepsis Labs: ?No results for input(s): PROCALCITON, LATICACIDVEN in the last 168 hours. ? ?Recent Results (from the past 240 hour(s))  ?Resp Panel by RT-PCR (Flu A&B, Covid) Nasopharyngeal Swab      Status: None  ? Collection Time: 04/03/22  3:23 AM  ? Specimen: Nasopharyngeal Swab; Nasopharyngeal(NP) swabs in vial transport medium  ?Result Value Ref Range Status  ? SARS Coronavirus 2 by RT PCR NEGAT

## 2022-04-05 DIAGNOSIS — I10 Essential (primary) hypertension: Secondary | ICD-10-CM

## 2022-04-05 DIAGNOSIS — E669 Obesity, unspecified: Secondary | ICD-10-CM

## 2022-04-05 DIAGNOSIS — E66811 Obesity, class 1: Secondary | ICD-10-CM

## 2022-04-05 DIAGNOSIS — Z951 Presence of aortocoronary bypass graft: Secondary | ICD-10-CM

## 2022-04-05 DIAGNOSIS — N1831 Chronic kidney disease, stage 3a: Secondary | ICD-10-CM

## 2022-04-05 DIAGNOSIS — I5023 Acute on chronic systolic (congestive) heart failure: Secondary | ICD-10-CM | POA: Diagnosis not present

## 2022-04-05 DIAGNOSIS — I480 Paroxysmal atrial fibrillation: Secondary | ICD-10-CM

## 2022-04-05 DIAGNOSIS — E1121 Type 2 diabetes mellitus with diabetic nephropathy: Secondary | ICD-10-CM | POA: Diagnosis not present

## 2022-04-05 LAB — BASIC METABOLIC PANEL
Anion gap: 13 (ref 5–15)
BUN: 30 mg/dL — ABNORMAL HIGH (ref 8–23)
CO2: 25 mmol/L (ref 22–32)
Calcium: 8.9 mg/dL (ref 8.9–10.3)
Chloride: 101 mmol/L (ref 98–111)
Creatinine, Ser: 1.34 mg/dL — ABNORMAL HIGH (ref 0.44–1.00)
GFR, Estimated: 44 mL/min — ABNORMAL LOW (ref >60)
Glucose, Bld: 123 mg/dL — ABNORMAL HIGH (ref 70–99)
Potassium: 4.4 mmol/L (ref 3.5–5.1)
Sodium: 139 mmol/L (ref 135–145)

## 2022-04-05 LAB — GLUCOSE, CAPILLARY
Glucose-Capillary: 146 mg/dL — ABNORMAL HIGH (ref 70–99)
Glucose-Capillary: 229 mg/dL — ABNORMAL HIGH (ref 70–99)
Glucose-Capillary: 184 mg/dL — ABNORMAL HIGH (ref 70–99)
Glucose-Capillary: 173 mg/dL — ABNORMAL HIGH (ref 70–99)

## 2022-04-05 LAB — MAGNESIUM: Magnesium: 2.3 mg/dL (ref 1.7–2.4)

## 2022-04-05 LAB — PROTIME-INR
INR: 2.9 — ABNORMAL HIGH (ref 0.8–1.2)
Prothrombin Time: 30.4 seconds — ABNORMAL HIGH (ref 11.4–15.2)

## 2022-04-05 MED ORDER — WARFARIN SODIUM 2.5 MG PO TABS
2.5000 mg | ORAL_TABLET | Freq: Once | ORAL | Status: AC
  Administered 2022-04-05: 2.5 mg via ORAL
  Filled 2022-04-05: qty 1

## 2022-04-05 NOTE — Progress Notes (Signed)
?  Progress Note ? ? ?Patient: Rachael Davis [id number] DOB: 1956-01-04 DOA: 04/03/2022     2 ?DOS: the patient was seen and examined on 04/05/2022 ?  ?Brief hospital course: ?Mrs. Rachael Davis was admitted to the hospital with the working diagnosis of decompensated heart failure.  ? ?66 y.o. female with history of chronic systolic heart failure, diabetes mellitus, bioprosthetic mitral valve, CAD status post CABG presents to the ER because of shortness of breath.  Patient states she had traveled to [facility] 4/30 to attend family members sports event and at the time missed her Lasix dose. Patient was recently placed on Keflex for left lower extremity cellulitis.  She had taken about 5 days of antibiotics. ?ED Course: In the ER patient was appearing mildly tachypneic.  Chest x-ray shows vascular congestion and pleural effusion.  BNP is 2700.  High sensitive troponin is 53 and 53.  Patient was given Lasix 80 mg IV and admitted for acute CHF, HTN urgerncy. ? ?Echocardiogram with reduced LV systolic function 25 to [id number].  ? ? ?Assessment and Plan: ?* Acute on chronic systolic CHF (congestive heart failure) ([place]) ?Echocardiogram with reduced LV systolic function with EF 25 to 30% with global hypokinesis. Moderate cavity dilatation. Moderate reduction in RV systolic function. RVSP 38,8 mmHg.  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 Mild to moderate TR.  ? ?Urine output is 1,650 ml over last 24 hrs ?Systolic blood pressure [id number] to 113 mmHg.  ? ?Plan to continue diuresis with furosemide 60 mg IV q12 hrs ?Continue with dapagliflozin and carvedilol.  ? ?Essential hypertension ?Continue blood pressure control with carvedilol and diuresis with IV furosemide.  ? ?Chronic kidney disease, stage 3a ([place]) ?Renal function with serum cr at 1.34 with K at 4,4 and serum bicarbonate at 25. ?Plan to continue diuresis with furosemide and follow up renal function in am.  ? ? ?Diabetes mellitus with  nephropathy ([place]) ?Continue glucose cover and monitoring with insulin sliding scale. ?Fasting glucose this am is 123.  ? ?S/P CABG x 4 ?No chest pain  ? ?PAF (paroxysmal atrial fibrillation) ([place]) ?Continue rate control with carvedilol and anticoagulation with warfarin. ?Follow pharmacy protocol and follow up INR.  ? ?Class 1 obesity ?Calculated BMI is 30 ? ? ? ? ?  ? ?Subjective: Patient is feeling better, no edema is improving but not back to baseline  ? ?Physical Exam: ?Vitals:  ? 04/04/22 2300 04/05/22 0501 04/05/22 0738 04/05/22 1100  ?BP:  113/75 120/76 110/78  ?Pulse:  75 79 75  ?Resp:  20 19 20   ?Temp:  97.9 ?F (36.6 ?C)  97.8 ?F (36.6 ?C)  ?TempSrc:  Oral  Oral  ?SpO2:  100% 98% 98%  ?Weight: 75.8 kg 76.6 kg    ?Height:      ? ?Neurology awake and alert ?ENT with no pallor ?Cardiovascular with S1 and S2 present with no gallops or rubs ?Respiratory with no rales or wheezing ?Abdomen not distended ?Positive lower extremity edema pitting ++ bilaterally  ?Data Reviewed: ? ? ? ?Family Communication: no family at the bedside  ? ?Disposition: ?Status is: Inpatient ?Remains inpatient appropriate because: heart failure on IV furosemide  ? Planned Discharge Destination: Home ? ?Author: ?Tawni Millers, MD ?04/05/2022 1:08 PM ? ?For on call review [email or web address].  ?

## 2022-04-05 NOTE — Assessment & Plan Note (Addendum)
No chest pain  ?Poor quality stress test on 03/23, prior MI with no ischemia.  ?

## 2022-04-05 NOTE — Assessment & Plan Note (Addendum)
Patient was admitted to the cardiac ward and received aggressive diuretic therapy with IV furosemide, negative fluid balances was achieved, -5,950 ml, with significant improvement in her symptoms.  ? ?Echocardiogram with reduced LV systolic function with EF 25 to 30% with global hypokinesis. Moderate cavity dilatation. Moderate reduction in RV systolic function. RVSP 38,8 mmHg.  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 Mild to moderate TR.  ? ?Plan to continue medical therapy with carvedilol and SGLT2i ?Diuresis with furosemide.  ? ?Holding on entresto due to risk of recurrent hyperkalemia. ?Potentially can use sodium zirconium to prevent hyperkalemia, but patient need to have better compliance.  ? ?Follow up with EP and heart failure clinic, patient has been deemed candidate for ICD for primary prevention.  ? ? ?

## 2022-04-05 NOTE — Assessment & Plan Note (Signed)
Calculated BMI is 30  

## 2022-04-05 NOTE — Progress Notes (Signed)
ANTICOAGULATION CONSULT NOTE ? ?Pharmacy Consult for Coumadin ?Indication: atrial fibrillation ? ?Allergies  ?Allergen Reactions  ? Keflex [Cephalexin] Rash  ?  Skin on feet peeled.  ? Asa [Aspirin]   ?  325 mg ----heartburn ?Pt can take 81 mg  ? Lisinopril Cough  ? ? ?Patient Measurements: ?Height: 5\' 3"  (160 cm) ?Weight: 76.6 kg (168 lb 12.8 oz) ?IBW/kg (Calculated) : 52.4 ? ?Vital Signs: ?Temp: 97.9 ?F (36.6 ?C) (05/03 0501) ?Temp Source: Oral (05/03 0501) ?BP: 120/76 (05/03 0738) ?Pulse Rate: 79 (05/03 0738) ? ?Labs: ?Recent Labs  ?  04/03/22 ?0311 04/03/22 ?[id number] 04/04/22 ?[id number] 04/05/22 ?0409  ?HGB 11.8*  --  12.0  --   ?HCT 39.3  --  40.7  --   ?PLT 202  --  184  --   ?LABPROT  --  21.4* 23.7* 30.4*  ?INR  --  1.9* 2.1* 2.9*  ?CREATININE 1.14*  --  1.38* 1.34*  ?TROPONINIHS 53* 53*  --   --   ? ? ? ?Estimated Creatinine Clearance: 40.5 mL/min (A) (by C-G formula based on SCr of 1.34 mg/dL (H)). ? ? ?Assessment: ?66yo female c/o minor chest tightness, admitted for acute on chronic CHF, to continue Coumadin for Afib; INR just below goal on admit, last dose taken 4/30. ? ?INR is therapeutic at 2.9 today with significant increase. Has had some INRs >3 outpatient on this regimen. No bleeding noted, CBC is stable.  ? ?Goal of Therapy:  ?INR 2-3 ?  ?Plan:  ?Coumadin 2.5 mg PO daily ?Monitor daily INR ?Monitor for s/sx of bleeding ? ?Thank you for involving pharmacy in this patient's care. ? ?Renold Genta, PharmD, BCPS ?Clinical Pharmacist ?Clinical phone for 04/05/2022 until 3p is [phone] ?04/05/2022 8:24 AM ? ?**Pharmacist phone directory can be found on [email or web address] listed under [place]** ? ? ?

## 2022-04-05 NOTE — Assessment & Plan Note (Addendum)
Patient tolerated well diuresis.  ?At the time of her discharge her renal function has a serum cr of 1,37 with K at 3,7 and serum bicarbonate at 25. ? ?Plan to follow up renal function in 7 days.  ? ?

## 2022-04-05 NOTE — Assessment & Plan Note (Addendum)
Continue glucose cover and monitoring with insulin sliding scale. ?Her glucose remained stable. ?Continue with metformin, januvia and dapagliflozin  ?

## 2022-04-05 NOTE — TOC Progression Note (Signed)
Transition of Care (TOC) - Progression Note  ? ? ?Patient Details  ?Name: Rachael Davis ?MRN: [id number] ?Date of Birth: August 06, 1956 ? ?Transition of Care (TOC) CM/SW Contact  ?Leone Haven, RN ?Phone Number: ?04/05/2022, 4:48 PM ? ?Clinical Narrative:    ?From home with spouse, chf ex, conts on IV lasix , indep, MD would like for her to get one more day of diuresis, then dc home.  Possibility of dc tomorrow. TOC will contnue to follow for dc needs.  ? ? ?  ?  ? ?Expected Discharge Plan and Services ?  ?  ?  ?  ?  ?                ?  ?  ?  ?  ?  ?  ?  ?  ?  ?  ? ? ?Social Determinants of Health (SDOH) Interventions ?  ? ?Readmission Risk Interventions ?   ? View : No data to display.  ?  ?  ?  ? ? ?

## 2022-04-05 NOTE — Assessment & Plan Note (Addendum)
Continue rate control with carvedilol and anticoagulation with warfarin. ?Discharge INR is 2,8  ?

## 2022-04-05 NOTE — Assessment & Plan Note (Addendum)
Blood pressure has been stable 104 to 132 mmHg. ?Continue with carvedilol and diuresis with furosemide.  ?

## 2022-04-06 ENCOUNTER — Encounter (HOSPITAL_COMMUNITY): Payer: Self-pay | Admitting: Internal Medicine

## 2022-04-06 ENCOUNTER — Other Ambulatory Visit (HOSPITAL_COMMUNITY): Payer: Self-pay

## 2022-04-06 LAB — BASIC METABOLIC PANEL
Anion gap: 12 (ref 5–15)
BUN: 31 mg/dL — ABNORMAL HIGH (ref 8–23)
CO2: 25 mmol/L (ref 22–32)
Calcium: 9 mg/dL (ref 8.9–10.3)
Chloride: 103 mmol/L (ref 98–111)
Creatinine, Ser: 1.37 mg/dL — ABNORMAL HIGH (ref 0.44–1.00)
GFR, Estimated: 43 mL/min — ABNORMAL LOW (ref >60)
Glucose, Bld: 173 mg/dL — ABNORMAL HIGH (ref 70–99)
Potassium: 3.7 mmol/L (ref 3.5–5.1)
Sodium: 140 mmol/L (ref 135–145)

## 2022-04-06 LAB — GLUCOSE, CAPILLARY: Glucose-Capillary: 161 mg/dL — ABNORMAL HIGH (ref 70–99)

## 2022-04-06 LAB — PROTIME-INR
INR: 2.8 — ABNORMAL HIGH (ref 0.8–1.2)
Prothrombin Time: 29.4 seconds — ABNORMAL HIGH (ref 11.4–15.2)

## 2022-04-06 LAB — MAGNESIUM: Magnesium: 2.5 mg/dL — ABNORMAL HIGH (ref 1.7–2.4)

## 2022-04-06 MED ORDER — WARFARIN SODIUM 2.5 MG PO TABS: 2.5000 mg | ORAL_TABLET | Freq: Every day | ORAL | Status: DC

## 2022-04-06 MED ORDER — WARFARIN SODIUM 5 MG PO TABS: 5.0000 mg | ORAL_TABLET | Freq: Every day | ORAL | Status: DC

## 2022-04-06 MED ORDER — FUROSEMIDE 80 MG PO TABS
80.0000 mg | ORAL_TABLET | Freq: Every day | ORAL | 0 refills | Status: DC
  Filled 2022-04-06: qty 90, 90d supply, fill #0

## 2022-04-06 MED ORDER — WARFARIN SODIUM 5 MG PO TABS: 5.0000 mg | ORAL_TABLET | Freq: Every day | ORAL | 3 refills | Status: DC

## 2022-04-06 NOTE — TOC Transition Note (Signed)
Transition of Care (TOC) - CM/SW Discharge Note ?Donn Pierini Charity fundraiser, BSN ?Transitions of Care ?Unit 4E- RN Case Manager ?See Treatment Team for direct phone #  ?[phone] [phone] cross coverage ? ?Patient Details  ?Name: VAEDA WESTALL ?MRN: [id number] ?Date of Birth: [birth year] ? ?Transition of Care (TOC) CM/SW Contact:  ?Zenda Alpers, Lenn Sink, RN ?Phone Number: ?04/06/2022, 11:40 AM ? ? ?Clinical Narrative:    ?Pt stable for transition home today, Transition of Care Department [facility]) has reviewed patient and no TOC needs have been identified at this time.  ? ?Final next level of care: Home/Self Care ?Barriers to Discharge: No Barriers Identified ? ? ?Patient Goals and CMS Choice ?  ?  ?Choice offered to / list presented to : NA ? ?Discharge Placement ?  ?           ? Home ?  ?  ?  ? ?Discharge Plan and Services ?  ?Discharge Planning Services: NA ?Post Acute Care Choice: NA          ?DME Arranged: N/A ?DME Agency: NA ?  ?  ?  ?HH Arranged: NA ?HH Agency: NA ?  ?  ?  ? ?Social Determinants of Health (SDOH) Interventions ?  ? ? ?Readmission Risk Interventions ? ?  04/06/2022  ? 11:40 AM  ?Readmission Risk Prevention Plan  ?Transportation Screening Complete  ?PCP or Specialist Appt within 5-7 Days Complete  ?Home Care Screening Complete  ?Medication Review (RN CM) Complete  ? ? ? ? ? ?

## 2022-04-06 NOTE — Discharge Summary (Addendum)
?Physician Discharge Summary ?  ?Patient: Rachael Davis MRN: [id number] DOB: 1956-06-29  ?Admit date:     04/03/2022  ?Discharge date: 04/06/22  ?Discharge Physician: Jimmy Picket Jahnavi Muratore  ? ?PCP: Donald Prose, MD  ? ?Recommendations at discharge:  ? ? Patient has been instructed to take furosemide 80 mg daily, apparently at home she was not taking daily diuretic therapy.    ?Continue to hold on Entresto, or spironolactone due to risk of hyperkalemia, she can potentially take sodium zirconium along with ras inhibition to prevent hyperkalemia, but will need to be compliant with her medical therapy to prevent complications.  ?Follow up with heart failure clinica as outpatient.  ? ?Discharge Diagnoses: ?Principal Problem: ?  Acute on chronic systolic CHF (congestive heart failure) ([place]) ?Active Problems: ?  Essential hypertension ?  Chronic kidney disease, stage 3a ([place]) ?  Diabetes mellitus with nephropathy ([place]) ?  S/P CABG x 4 ?  PAF (paroxysmal atrial fibrillation) ([place]) ?  Class 1 obesity ? ?Resolved Problems: ?  * No resolved hospital problems. * ? ?Hospital Course: ?[place] was admitted to the hospital with the working diagnosis of decompensated heart failure.  ? ?66 y.o. female with history of chronic systolic heart failure, diabetes mellitus, bioprosthetic mitral valve, CAD status post CABG presents to the ER because of shortness of breath.  Patient states she had traveled to [place] to attend family members sports event and at the time missed her Lasix dose. Patient was recently placed on Keflex for left lower extremity cellulitis.  She had taken about 5 days of antibiotics. ?In her initial physical examination patient was appearing mildly tachypneic. Blood pressure 170/113, HR 94, RR 32 02 saturation 98%. Lungs with no rhonchi or rales, positive JVD, heart with S1 and S2 present with no gallops, abdomen not distended and positive lower extremity edema.  ? ?Na 138, K 4,2 Cl 108,  bicarbonate 18, glucose 216 bun 24 cr 1,14  ?High sensitive troponin 53, 53 ?BNP 2,733  ?Wbc 8,2 hgb 11,8 plt 202  ?Sars covid 19 negative  ? ?Chest radiograph with cardiomegaly, positive bilateral hilar vascular congestion, small bilateral pleural effusions.  ? ?EKG 86 bpm, left axis deviation, right bundle branch block, sinus rhythm with 1st degree block, q wave lead II, III, AvF, with no significant ST segment or T wave changes.  ? ?Echocardiogram with reduced LV systolic function 25 to [id number].  ? ?Patient was placed on furosemide for diuresis with improvement in volume status.  ? ? ?Assessment and Plan: ?* Acute on chronic systolic CHF (congestive heart failure) ([place]) ?Patient was admitted to the cardiac ward and received aggressive diuretic therapy with IV furosemide, negative fluid balances was achieved, -5,950 ml, with significant improvement in her symptoms.  ? ?Echocardiogram with reduced LV systolic function with EF 25 to 30% with global hypokinesis. Moderate cavity dilatation. Moderate reduction in RV systolic function. RVSP 38,8 mmHg.  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 Mild to moderate TR.  ? ?Plan to continue medical therapy with carvedilol and SGLT2i ?Diuresis with furosemide.  ? ?Holding on entresto due to risk of recurrent hyperkalemia. ?Potentially can use sodium zirconium to prevent hyperkalemia, but patient need to have better compliance.  ? ?Follow up with EP and heart failure clinic, patient has been deemed candidate for ICD for primary prevention.  ? ? ? ?Essential hypertension ?Blood pressure has been stable 104 to 132 mmHg. ?Continue with carvedilol and diuresis with furosemide.  ? ?  Chronic kidney disease, stage 3a ([place]) ?Patient tolerated well diuresis.  ?At the time of her discharge her renal function has a serum cr of 1,37 with K at 3,7 and serum bicarbonate at 25. ? ?Plan to follow up renal function in 7 days.  ? ? ?Diabetes mellitus with nephropathy  ([place]) ?Continue glucose cover and monitoring with insulin sliding scale. ?Her glucose remained stable. ?Continue with metformin, januvia and dapagliflozin  ? ?S/P CABG x 4 ?No chest pain  ?Poor quality stress test on 03/23, prior MI with no ischemia.  ? ?PAF (paroxysmal atrial fibrillation) ([place]) ?Continue rate control with carvedilol and anticoagulation with warfarin. ?Discharge INR is 2,8  ? ?Class 1 obesity ?Calculated BMI is 30 ? ? ? ? ?  ? ? ?Consultants: none  ?Procedures performed: none   ?Disposition: Home ?Diet recommendation:  ?Cardiac diet ?DISCHARGE MEDICATION: ?Allergies as of 04/06/2022   ? ?   Reactions  ? Keflex [cephalexin] Rash  ? Skin on feet peeled.  ? Asa [aspirin]   ? 325 mg ----heartburn ?Pt can take 81 mg  ? Lisinopril Cough  ? ?  ? ?  ?Medication List  ?  ? ?STOP taking these medications   ? ?cephALEXin 500 MG capsule ?Commonly known as: KEFLEX ?  ? ?  ? ?TAKE these medications   ? ?acetaminophen 325 MG tablet ?Commonly known as: TYLENOL ?Take 2 tablets (650 mg total) by mouth every 6 (six) hours as needed for mild pain or headache. ?  ?aspirin EC 81 MG tablet ?Take 81 mg by mouth daily. ?  ?atorvastatin 40 MG tablet ?Commonly known as: LIPITOR ?TAKE 1 TABLET (40 MG TOTAL) BY MOUTH DAILY AT 6 PM. ?What changed: when to take this ?  ?carvedilol 3.125 MG tablet ?Commonly known as: COREG ?TAKE 1 TABLET BY MOUTH 2 TIMES DAILY. ?What changed: when to take this ?  ?dapagliflozin propanediol 10 MG Tabs tablet ?Commonly known as: FARXIGA ?Take 10 mg by mouth daily. ?  ?furosemide 80 MG tablet ?Commonly known as: LASIX ?Take 1 tablet (80 mg total) by mouth daily. ?  ?glipiZIDE 10 MG 24 hr tablet ?Commonly known as: GLUCOTROL XL ?Take 1 tablet (10 mg total) by mouth daily with breakfast. ?  ?Januvia 100 MG tablet ?Generic drug: sitaGLIPtin ?Take 100 mg by mouth daily. Patient has not started yet ?  ?magnesium oxide 400 MG tablet ?Commonly known as: MAG-OX ?TAKE 1 TABLET BY MOUTH EVERY DAY ?  ?metFORMIN  1000 MG tablet ?Commonly known as: GLUCOPHAGE ?Take 1,000 mg by mouth 2 (two) times daily with a meal. ?  ?warfarin 5 MG tablet ?Commonly known as: COUMADIN ?Take as directed. If you are unsure how to take this medication, talk to your nurse or doctor. ?Original instructions: Take 1 tablet (5 mg total) by mouth daily. ?What changed: See the new instructions. ?  ? ?  ? ? ?Discharge Exam: ?Filed Weights  ? 04/05/22 0501 04/06/22 0255 04/06/22 0754  ?Weight: 76.6 kg 76.6 kg 76.6 kg  ? ?BP (!) 132/92 (BP Location: Left Arm)   Pulse 77   Temp 97.7 ?F (36.5 ?C) (Oral)   Resp 17   Ht 5\' 3"  (1.6 m)   Wt 76.6 kg   SpO2 98%   BMI 29.90 kg/m?  ? ?Patient is feeling better, dyspnea has improved, along with edema of her lower extremities ? ?Neurology awake and alert ?ENT with mild pallor ?Cardiovascular with S1 and S2 present with no gallops, rubs or murmurs ?No JVD ?  Trace non pitting lower extremity edema ?Respiratory with no wheezing or rales ?Abdomen not distended  ? ?Condition at discharge: good ? ?The results of significant diagnostics from this hospitalization (including imaging, microbiology, ancillary and laboratory) are listed below for reference.  ? ?Imaging Studies: ?DG Chest 2 View ? ?Result Date: 04/03/2022 ?CLINICAL DATA:  Chest pain, dyspnea. EXAM: CHEST - 2 VIEW COMPARISON:  05/28/2019. FINDINGS: The heart is enlarged the mediastinal contour is stable. There is evidence of prior cardiothoracic [place] with a prosthetic cardiac valve. Atherosclerotic calcification of the aorta is noted. The pulmonary vasculature is mildly distended. There are small bilateral pleural effusions. Mild atelectasis is noted in the mid left lung and lung bases. No pneumothorax. Degenerative changes are present in the thoracic spine. IMPRESSION: 1. Cardiomegaly with pulmonary vascular congestion. 2. Small bilateral pleural effusions with atelectasis. Electronically Signed   By: Brett Fairy M.D.   On: 04/03/2022 03:31    ? ?Microbiology: ?Results for orders placed or performed during the hospital encounter of 04/03/22  ?Resp Panel by RT-PCR (Flu A&B, Covid) Nasopharyngeal Swab     Status: None  ? Collection Time: 04/03/22  3:23 AM  ? Specimen: Nas

## 2022-04-06 NOTE — Progress Notes (Signed)
ANTICOAGULATION CONSULT NOTE ? ?Pharmacy Consult for Coumadin ?Indication: atrial fibrillation ? ?Allergies  ?Allergen Reactions  ? Keflex [Cephalexin] Rash  ?  Skin on feet peeled.  ? Asa [Aspirin]   ?  325 mg ----heartburn ?Pt can take 81 mg  ? Lisinopril Cough  ? ? ?Patient Measurements: ?Height: 5\' 3"  (160 cm) ?Weight: 76.6 kg (168 lb 12.8 oz) ?IBW/kg (Calculated) : 52.4 ? ?Vital Signs: ?Temp: 97.7 ?F (36.5 ?C) (05/04 0500) ?Temp Source: Oral (05/04 0500) ?BP: 132/92 (05/04 0503) ?Pulse Rate: 77 (05/04 0503) ? ?Labs: ?Recent Labs  ?  04/04/22 ?0446 04/05/22 ?0409 04/06/22 ?0250  ?HGB 12.0  --   --   ?HCT 40.7  --   --   ?PLT 184  --   --   ?LABPROT 23.7* 30.4* 29.4*  ?INR 2.1* 2.9* 2.8*  ?CREATININE 1.38* 1.34* 1.37*  ? ? ? ?Estimated Creatinine Clearance: 39.6 mL/min (A) (by C-G formula based on SCr of 1.37 mg/dL (H)). ? ? ?Assessment: ?66yo female c/o minor chest tightness, admitted for acute on chronic CHF, to continue Coumadin for Afib; INR just below goal on admit, last dose taken 4/30. ? ?INR is therapeutic at 2.8 ?Has had some INRs >3 outpatient on higher outpatient regimen. Will dc on recently decreased warfarin 5mg  daily  ?No bleeding noted, CBC is stable.  ? ?Goal of Therapy:  ?INR 2-3 ?  ?Plan:  ?Coumadin 5mg  PO daily ?Monitor daily INR ?Monitor for s/sx of bleeding ? ? ? ?Bonnita Nasuti Pharm.D. CPP, BCPS ?Clinical Pharmacist ?[phone] ?04/06/2022 11:59 AM  ? ? ?**Pharmacist phone directory can be found on [email or web address] listed under [address]** ? ? ?

## 2022-04-06 NOTE — Progress Notes (Signed)
Pharmacist Heart Failure Core Measure Documentation ? ?Assessment: ?Javia C Glick has an EF documented as 20% by ECHO. ? ?Rationale: Heart failure patients with left ventricular systolic dysfunction (LVSD) and an EF < 40% should be prescribed an angiotensin converting enzyme inhibitor (ACEI) or angiotensin receptor blocker (ARB) at discharge unless a contraindication is documented in the medical record. ? ?This patient is not currently on an ACEI or ARB for HF. ? ?This note is being placed in the record in order to provide documentation that a contraindication to the use of these agents is present for this encounter. ? ?ACE Inhibitor or Angiotensin Receptor Blocker is contraindicated ?(specify all that apply) ? ?[]   ACEI allergy AND ARB allergy ?[]   Angioedema ?[]   Moderate or severe aortic stenosis ?[]   Hyperkalemia ?[x]   Hypotension ?[]   Renal artery stenosis ?[x]   Worsening renal function, preexisting renal disease or dysfunction ? ? ? Pharm.D. CPP, BCPS ?Clinical Pharmacist ?[phone] ?04/06/2022 12:13 PM  ? ?

## 2022-04-06 NOTE — Discharge Instructions (Signed)
Information on my medicine - Coumadin   (Warfarin)  This medication education was reviewed with me or my healthcare representative as part of my discharge preparation.   Why was Coumadin prescribed for you? Coumadin was prescribed for you because you have a blood clot or a medical condition that can cause an increased risk of forming blood clots. Blood clots can cause serious health problems by blocking the flow of blood to the heart, lung, or brain. Coumadin can prevent harmful blood clots from forming. As a reminder your indication for Coumadin is:  Stroke Prevention because of Atrial Fibrillation  What test will check on my response to Coumadin? While on Coumadin (warfarin) you will need to have an INR test regularly to ensure that your dose is keeping you in the desired range. The INR (international normalized ratio) number is calculated from the result of the laboratory test called prothrombin time (PT).  If an INR APPOINTMENT HAS NOT ALREADY BEEN MADE FOR YOU please schedule an appointment to have this lab work done by your health care provider within 7 days. Your INR goal is a number between:  2 to 3   What  do you need to  know  About  COUMADIN? Take Coumadin (warfarin) exactly as prescribed by your healthcare provider about the same time each day.  DO NOT stop taking without talking to the doctor who prescribed the medication.  Stopping without other blood clot prevention medication to take the place of Coumadin may increase your risk of developing a new clot or stroke.  Get refills before you run out.  What do you do if you miss a dose? If you miss a dose, take it as soon as you remember on the same day then continue your regularly scheduled regimen the next day.  Do not take two doses of Coumadin at the same time.  Important Safety Information A possible side effect of Coumadin (Warfarin) is an increased risk of bleeding. You should call your healthcare provider right away if you  experience any of the following: Bleeding from an injury or your nose that does not stop. Unusual colored urine (red or dark brown) or unusual colored stools (red or black). Unusual bruising for unknown reasons. A serious fall or if you hit your head (even if there is no bleeding).  Some foods or medicines interact with Coumadin (warfarin) and might alter your response to warfarin. To help avoid this: Eat a balanced diet, maintaining a consistent amount of Vitamin K. Notify your provider about major diet changes you plan to make. Avoid alcohol or limit your intake to 1 drink for women and 2 drinks for men per day. (1 drink is 5 oz. wine, 12 oz. beer, or 1.5 oz. liquor.)  Make sure that ANY health care provider who prescribes medication for you knows that you are taking Coumadin (warfarin).  Also make sure the healthcare provider who is monitoring your Coumadin knows when you have started a new medication including herbals and non-prescription products.  Coumadin (Warfarin)  Major Drug Interactions  Increased Warfarin Effect Decreased Warfarin Effect  Alcohol (large quantities) Antibiotics (esp. Septra/Bactrim, Flagyl, Cipro) Amiodarone (Cordarone) Aspirin (ASA) Cimetidine (Tagamet) Megestrol (Megace) NSAIDs (ibuprofen, naproxen, etc.) Piroxicam (Feldene) Propafenone (Rythmol SR) Propranolol (Inderal) Isoniazid (INH) Posaconazole (Noxafil) Barbiturates (Phenobarbital) Carbamazepine (Tegretol) Chlordiazepoxide (Librium) Cholestyramine (Questran) Griseofulvin Oral Contraceptives Rifampin Sucralfate (Carafate) Vitamin K   Coumadin (Warfarin) Major Herbal Interactions  Increased Warfarin Effect Decreased Warfarin Effect  Garlic Ginseng Ginkgo biloba Coenzyme Q10 Green   tea St. John's wort    Coumadin (Warfarin) FOOD Interactions  Eat a consistent number of servings per week of foods HIGH in Vitamin K (1 serving =  cup)  Collards (cooked, or boiled & drained) Kale  (cooked, or boiled & drained) Mustard greens (cooked, or boiled & drained) Parsley *serving size only =  cup Spinach (cooked, or boiled & drained) Swiss chard (cooked, or boiled & drained) Turnip greens (cooked, or boiled & drained)  Eat a consistent number of servings per week of foods MEDIUM-HIGH in Vitamin K (1 serving = 1 cup)  Asparagus (cooked, or boiled & drained) Broccoli (cooked, boiled & drained, or raw & chopped) Brussel sprouts (cooked, or boiled & drained) *serving size only =  cup Lettuce, raw (green leaf, endive, romaine) Spinach, raw Turnip greens, raw & chopped   These websites have more information on Coumadin (warfarin):  www.coumadin.com; www.ahrq.gov/consumer/coumadin.htm;   

## 2022-04-06 NOTE — Progress Notes (Signed)
Heart Failure Nurse Navigator Progress Note ? ?PCP: Deatra James, MD ?PCP-Cardiologist: Dr. Chilton Si ?Admission Diagnosis: Acute on chronic congestive heart failure ?Admitted from: Home ? ?Presentation:   ?Rachael Davis presented with shortness of breath, bilateral leg swelling x 2-3 days, some cough and chest tightness. BP 170/113, BNP 2,733, Troponin 53, Glucose 216. IV lasix given and scheduled every 12 hours. Chest x-ray reported vascular congestion and pleural effusion.  ? ?Patient was interviewed for HF TOC. Education was done with Heart Failure book ,the sign and symptoms of heart failure, daily weights, when to call your doctor or come to the ER, medication and appointment compliance, Diet/ fluid restrictions. Patient was interactive and asked questions during the conversation. A hospital follow up at the HF [facility] was scheduled for 04/17/2022 @ 11 am. ? Patient was previously before admission to be seen in the AHF clinic, However, a appointment was never arranged. Multiple messages were left for patient. Patient stated "she just didn't get a chance to call back before she was readmitted to the hospital this time".  ? ?ECHO/ LVEF: 25-30% ? ?Clinical Course: ? ?Past Medical History:  ?Diagnosis Date  ? Arrhythmia   ? Hx PAF, PVC's  ? CHF (congestive heart failure) (HCC)   ? Coronary artery disease   ? 04/04/2017 STEMI  ? Diabetes (HCC)   ? H/O mitral valve replacement 09/21/2021  ? Heart attack (HCC)   ? per pt reprot  ? Hyperlipidemia   ? Hypertension   ? New onset atrial fibrillation (HCC) 05/2017  ?  ? ?Social History  ? ?Socioeconomic History  ? Marital status: Married  ?  Spouse name: Aaima Gaddie  ? Number of children: 1  ? Years of education: Not on file  ? Highest education level: Bachelor's degree (e.g., BA, AB, BS)  ?Occupational History  ? Occupation: Armed forces training and education officer  ?  Comment: Full time  ?Tobacco Use  ? Smoking status: Never  ? Smokeless tobacco: Never  ?Vaping Use  ? Vaping Use: Never  used  ?Substance and Sexual Activity  ? Alcohol use: No  ? Drug use: Yes  ?  Types: Marijuana  ?  Comment: In her 22 's  ? Sexual activity: Not on file  ?Other Topics Concern  ? Not on file  ?Social History Narrative  ? Not on file  ? ?Social Determinants of Health  ? ?Financial Resource Strain: Not on file  ?Food Insecurity: No Food Insecurity  ? Worried About Programme researcher, broadcasting/film/video in the Last Year: Never true  ? Ran Out of Food in the Last Year: Never true  ?Transportation Needs: No Transportation Needs  ? Lack of Transportation (Medical): No  ? Lack of Transportation (Non-Medical): No  ?Physical Activity: Not on file  ?Stress: Not on file  ?Social Connections: Not on file  ? ?Education Assessment and Provision: ? ?Detailed education and instructions provided on heart failure disease management including the following: ? ?Signs and symptoms of Heart Failure ?When to call the physician ?Importance of daily weights ?Low sodium diet ?Fluid restriction ?Medication management ?Anticipated future follow-up appointments ? ?Patient education given on each of the above topics.  Patient acknowledges understanding via teach back method and acceptance of all instructions. ? ?Education Materials:  "Living Better With Heart Failure" Booklet, HF zone tool, & Daily Weight Tracker Tool. ? ?Patient has scale at home: yes ?Patient has pill box at home: NA ?   ?High Risk Criteria for Readmission and/or Poor Patient Outcomes: ?Heart  failure hospital admissions (last 6 months):  1 ?No Show rate: 1 % ?Difficult social situation: No ?Demonstrates medication adherence: No, ( with Lasix po) ?Primary Language: English ?Literacy level: Reading, writing, and comprehension.  ? ?Barriers of Care:   ?Medication compliance ?Fluid/ Diet restrictions ? ?Considerations/Referrals:  ? ?Referral made to Heart Failure Pharmacist Stewardship: No ?Referral made to Heart Failure CSW/NCM TOC: No ?Referral made to Heart & Vascular TOC clinic: Yes, 04/17/2022 @  11 am.  ? ?Items for Follow-up on DC/TOC: ?Optimize medications ?Medication compliance ( Lasix) ?Diet/ Fluid restrictions ? ? ?Rhae Hammock, BSN, RN ?Heart Failure Nurse Navigator ?Secure Chat Only   ?

## 2022-04-07 ENCOUNTER — Other Ambulatory Visit (HOSPITAL_COMMUNITY): Payer: Self-pay

## 2022-04-13 ENCOUNTER — Encounter (HOSPITAL_COMMUNITY): Payer: No Typology Code available for payment source

## 2022-04-16 NOTE — H&P (View-Only) (Signed)
HEART & VASCULAR TRANSITION OF CARE CONSULT NOTE     Referring Physician: Dr. Cathlean Sauer Primary Care: Dr. Nancy Fetter Primary Cardiologist: Dr. Oval Linsey  HPI: Referred to clinic by Dr. Cathlean Sauer for heart failure consultation. 66 y.o. female with history of CAD s/p CABG 99991111, chronic systolic CHF/iCM, hx bioprosthetic MV replacement at time of CABG, PVCs, DM, remote AF (shortly after CABG 2018).   She was previously followed by Dr. Haroldine Laws in [place] Failure clinic, graduated from clinic in 05/2019. Echo 05/2019: EF 35-40%. Has been followed by Dr. Oval Linsey with Cardiology.  Echo 01/22: EF 30-35%  Echo 01/23: EF 25-30%, RV moderately reduced, RVSP 38.8 mmHg, trivial MR, mean gradient 7 mmHg across mitral valve prosthesis, IVC dilated with estimated RAP 15 mmHg  Saw EP in 03/23. D/t presence of progressive conduction system disease over last few years workup for amyloidosis recommended. Unable to tolerate cMRI d/t claustrophobia. Has PYP scan scheduled for later this week.   7 day Zio 04/23: SR with avg rate 83 bpm, 1st degree AVB and intermittent Wenckebach AV block noted. 2.3% PVCs. 6 runs VT, longest 27 min and 48 seconds. ICD recommended. She wanted to discuss with family before proceeding.  Admitted earlier this month with a/c CHF. Diuresed with IV lasix. Discharged on furosemide 80 mg daily. GDMT - coreg 3.125 mg BID and dapagliflozin 10 mg daily.   Reports feeling much better since hospital discharge. Dyspnea significantly improved. Has not been weighing herself at home, batteries in scale died. She just replaced them. Follows fluid restriction but admits to indiscretions with sodium intake. No orthopnea, PND or lower extremity edema. No CP. Notes occasional palpitations. No dizziness, presyncope or syncope.   BP elevated but has not taken medications today.  Lives with her husband. No housing or food insecurity. No ETOH or drug use.  Review of Systems: [y] = yes, [ ]  = no    General: Weight gain [ ] ; Weight loss [ ] ; Anorexia [ ] ; Fatigue [ ] ; Fever [ ] ; Chills [ ] ; Weakness [ ]   Cardiac: Chest pain/pressure [ ] ; Resting SOB [ ] ; Exertional SOB [Y]; Orthopnea [ ] ; Pedal Edema [ ] ; Palpitations [Y]; Syncope [ ] ; Presyncope [ ] ; Paroxysmal nocturnal dyspnea[ ]   Pulmonary: Cough [ ] ; Wheezing[ ] ; Hemoptysis[ ] ; Sputum [ ] ; Snoring [ ]   GI: Vomiting[ ] ; Dysphagia[ ] ; Melena[ ] ; Hematochezia [ ] ; Heartburn[ ] ; Abdominal pain [ ] ; Constipation [ ] ; Diarrhea [ ] ; BRBPR [ ]   GU: Hematuria[ ] ; Dysuria [ ] ; Nocturia[ ]   Vascular: Pain in legs with walking [ ] ; Pain in feet with lying flat [ ] ; Non-healing sores [ ] ; Stroke [ ] ; TIA [ ] ; Slurred speech [ ] ;  Neuro: Headaches[ ] ; Vertigo[ ] ; Seizures[ ] ; Paresthesias[ ] ;Blurred vision [ ] ; Diplopia [ ] ; Vision changes [ ]   Ortho/Skin: Arthritis [ ] ; Joint pain [ ] ; Muscle pain [ ] ; Joint swelling [ ] ; Back Pain [ ] ; Rash [ ]   Psych: Depression[ ] ; Anxiety[ ]   Heme: Bleeding problems [ ] ; Clotting disorders [ ] ; Anemia [ ]   Endocrine: Diabetes [Y]; Thyroid dysfunction[ ]    Past Medical History:  Diagnosis Date   Arrhythmia    Hx PAF, PVC's   CHF (congestive heart failure) (HCC)    Coronary artery disease    04/04/2017 STEMI   Diabetes [facility])    H/O mitral valve replacement 09/21/2021   Heart attack ([place])    per pt reprot   Hyperlipidemia  Hypertension    New onset atrial fibrillation ([place]) 05/2017    Current Outpatient Medications  Medication Sig Dispense Refill   acetaminophen (TYLENOL) 325 MG tablet Take 2 tablets (650 mg total) by mouth every 6 (six) hours as needed for mild pain or headache.     aspirin EC 81 MG tablet Take 81 mg by mouth daily.     atorvastatin (LIPITOR) 40 MG tablet TAKE 1 TABLET (40 MG TOTAL) BY MOUTH DAILY AT 6 PM. 90 tablet 3   carvedilol (COREG) 3.125 MG tablet TAKE 1 TABLET BY MOUTH 2 TIMES DAILY. 180 tablet 2   dapagliflozin propanediol (FARXIGA) 10 MG TABS tablet Take 10 mg by mouth  daily.     glipiZIDE (GLUCOTROL XL) 10 MG 24 hr tablet Take 1 tablet (10 mg total) by mouth daily with breakfast.     JANUVIA 100 MG tablet Take 100 mg by mouth daily. Patient has not started yet     magnesium oxide (MAG-OX) 400 MG tablet TAKE 1 TABLET BY MOUTH EVERY DAY 90 tablet 3   metFORMIN (GLUCOPHAGE) 1000 MG tablet Take 1,000 mg by mouth 2 (two) times daily with a meal.     patiromer (VELTASSA) 8.4 g packet Take 1 packet (8.4 g total) by mouth daily. 30 each 1   sacubitril-valsartan (ENTRESTO) 24-26 MG Take 1 tablet by mouth 2 (two) times daily. 60 tablet 2   warfarin (COUMADIN) 5 MG tablet Take 1 tablet (5 mg total) by mouth daily. 45 tablet 3   furosemide (LASIX) 80 MG tablet Take 1 tablet (80 mg total) by mouth daily. 90 tablet 1   No current facility-administered medications for this encounter.    Allergies  Allergen Reactions   Keflex [Cephalexin] Rash    Skin on feet peeled.   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  Tobacco Use   Smoking status: Never   Smokeless tobacco: Never  Vaping Use   Vaping Use: Never used  Substance and Sexual Activity   Alcohol use: No   Drug use: Yes    Types: Marijuana    Comment: In her 65 's   Sexual activity: Not on file  Other Topics Concern   Not on file  Social History Narrative   Not on file   Social Determinants of Health   Financial Resource Strain: Not on file  Food Insecurity: No Food Insecurity   Worried About Charity fundraiser in the Last Year: Never true   Ran Out of Food in the Last Year: Never true  Transportation Needs: No Transportation Needs   Lack of Transportation  (Medical): No   Lack of Transportation (Non-Medical): No  Physical Activity: Not on file  Stress: Not on file  Social Connections: Not on file  Intimate Partner Violence: Not on file      Family History  Problem Relation Age of Onset   Heart attack Father 31   Breast cancer Maternal Grandmother     Vitals:   04/17/22 1114  BP: 140/80  Pulse: 84  SpO2: 98%  Weight:  79.5 kg    PHYSICAL EXAM: General:  Well appearing. Ambulated into clinic. HEENT: normal Neck: supple. no JVD. Carotids 2+ bilat; no bruits. Enlarged thyroid. Cor: PMI nondisplaced. Regular rate & rhythm. No rubs, gallops or murmurs. Lungs: clear Abdomen: soft, nontender, nondistended.  Extremities: no cyanosis, clubbing, rash, edema Neuro: alert & oriented x 3, cranial nerves grossly intact. moves all 4 extremities w/o difficulty. Affect pleasant.  ECG: SR with 1st degree AVB, 84 bpm, RBBB   ASSESSMENT & PLAN: Chronic Systolic Heart Failure: Ischemic cardiomyopathy - Echo 02/06/2018 LVEF 35-40% - Echo 06/20 EF 35-40% - Echo 01/22: EF 30-35% - Echo 01/23 EF 25-30% (appears closer to 20% on review with Dr. Haroldine Laws) - Sustained VT on zio last month. ? Scar-induced. Worry d/t progressive HF. She initially wanted to think about ICD, now willing to consider. Discussed with Dr. Haroldine Laws. Will order LifeVest and refer back to EP for f/u. Arranged for [facility] later this week to assess bypass graft/native coronaries and hemodynamics. - Need to rule out amyloidosis and sarcoid given conduction system disease in arrhythmias. Unable to tolerate MRI d/t claustrophobia. PYP scheduled later this week. - NYHA II currently. Volume looks good. Continue 80 mg furosemide daily. Discussed when to take additional diuretic as needed - On coreg 3.125 mg BID. Will not increase further d/t baseline conduction system disease.  - Continue Farxiga  - Entresto and spiro previously stopped d/t hyperkalemia. Lokelma not covered by her  insurance. Last K 3.7. Will start Entresto 24/26 mg BID. Prescribed patiromer for her use at home in case needed.  - Had long discussion regarding limiting sodium intake - BMET/BNP today, BMET again in 1 week   2. CAD S/P CABG x4: - R/LHC later this week as above - No recent angina - Continue statin and ASA.  3. Bioprosthetic MVR:  - Echo 01/23: EF 25-30%, trivial MR, mean gradient 7 mmHg across mitral valve prosthesis - Continue warfarin and ASA. Denies bleeding. - Reminded about SBE prophylaxis   4. VT/PVCs:  - 7 day Zio, 04/23: 2.3% PVCs, sustained VT (longest 27 minutes) - Ordered LifeVest as above - No recent syncope - Referred back to EP for ICD  5. PAF - Sinus today - Continue warfarin. Denies bleeding  6. DM2 - Hgb A1c 6.7 in 05/23  7. Goiter - Thyroid nodule biopsied [id number], benign follicular nodule - TSH normal 05/23 - Overdue for follow-up with Dr. Renne Crigler.  Recommended f/u.   NYHA II GDMT  Diuretic-Lasix 80 mg daily BB-Coreg 3.125 mg BID Ace/ARB/ARNI-Adding Entresto  MRA-No d/t hyperkalemia SGLT2i-Farxiga 10 mg daily    Referred to HFSW (PCP, Medications, Transportation, ETOH Abuse, Drug Abuse, Insurance, Museum/gallery curator ): No Refer to Pharmacy: Yes, pharmD assisting with [facility] Refer to Home Health: No Refer to Advanced Heart Failure Clinic: Yes  Refer to General Cardiology: No, already established  Follow up  APP 3 weeks for medication titration, 2 months with Dr. Haroldine Laws.

## 2022-04-16 NOTE — Progress Notes (Addendum)
? ? ?HEART & VASCULAR TRANSITION OF CARE CONSULT NOTE  ? ? ? ?Referring Physician: Dr. Cathlean Sauer ?Primary Care: Dr. Nancy Fetter ?Primary Cardiologist: Dr. Oval Linsey ? ?HPI: ?Referred to clinic by Dr. Cathlean Sauer for heart failure consultation. 66 y.o. female with history of CAD s/p CABG 99991111, chronic systolic CHF/iCM, hx bioprosthetic MV replacement at time of CABG, PVCs, DM, remote AF (shortly after CABG 2018).  ? ?She was previously followed by Dr. Haroldine Laws in [place] Failure clinic, graduated from clinic in 05/2019. Echo 05/2019: EF 35-40%. Has been followed by Dr. Oval Linsey with Cardiology. ? ?Echo 01/22: EF 30-35% ? ?Echo 01/23: EF 25-30%, RV moderately reduced, RVSP 38.8 mmHg, trivial MR, mean gradient 7 mmHg across mitral valve prosthesis, IVC dilated with estimated RAP 15 mmHg ? ?Saw EP in 03/23. D/t presence of progressive conduction system disease over last few years workup for amyloidosis recommended. Unable to tolerate cMRI d/t claustrophobia. Has PYP scan scheduled for later this week.  ? ?7 day Zio 04/23: SR with avg rate 83 bpm, 1st degree AVB and intermittent Wenckebach AV block noted. 2.3% PVCs. 6 runs VT, longest 27 min and 48 seconds. ICD recommended. She wanted to discuss with family before proceeding. ? ?Admitted earlier this month with a/c CHF. Diuresed with IV lasix. Discharged on furosemide 80 mg daily. GDMT - coreg 3.125 mg BID and dapagliflozin 10 mg daily.  ? ?Reports feeling much better since hospital discharge. Dyspnea significantly improved. Has not been weighing herself at home, batteries in scale died. She just replaced them. Follows fluid restriction but admits to indiscretions with sodium intake. No orthopnea, PND or lower extremity edema. No CP. Notes occasional palpitations. No dizziness, presyncope or syncope.  ? ?BP elevated but has not taken medications today. ? ?Lives with her husband. No housing or food insecurity. No ETOH or drug use. ? ?Review of Systems: [y] = yes, [ ]  = no   ? ?General: Weight gain [ ] ; Weight loss [ ] ; Anorexia [ ] ; Fatigue [ ] ; Fever [ ] ; Chills [ ] ; Weakness [ ]   ?Cardiac: Chest pain/pressure [ ] ; Resting SOB [ ] ; Exertional SOB [Y]; Orthopnea [ ] ; Pedal Edema [ ] ; Palpitations [Y]; Syncope [ ] ; Presyncope [ ] ; Paroxysmal nocturnal dyspnea[ ]   ?Pulmonary: Cough [ ] ; Wheezing[ ] ; Hemoptysis[ ] ; Sputum [ ] ; Snoring [ ]   ?GI: Vomiting[ ] ; Dysphagia[ ] ; Melena[ ] ; Hematochezia [ ] ; Heartburn[ ] ; Abdominal pain [ ] ; Constipation [ ] ; Diarrhea [ ] ; BRBPR [ ]   ?GU: Hematuria[ ] ; Dysuria [ ] ; Nocturia[ ]   ?Vascular: Pain in legs with walking [ ] ; Pain in feet with lying flat [ ] ; Non-healing sores [ ] ; Stroke [ ] ; TIA [ ] ; Slurred speech [ ] ;  ?Neuro: Headaches[ ] ; Vertigo[ ] ; Seizures[ ] ; Paresthesias[ ] ;Blurred vision [ ] ; Diplopia [ ] ; Vision changes [ ]   ?Ortho/Skin: Arthritis [ ] ; Joint pain [ ] ; Muscle pain [ ] ; Joint swelling [ ] ; Back Pain [ ] ; Rash [ ]   ?Psych: Depression[ ] ; Anxiety[ ]   ?Heme: Bleeding problems [ ] ; Clotting disorders [ ] ; Anemia [ ]   ?Endocrine: Diabetes [Y]; Thyroid dysfunction[ ]  ? ? ?Past Medical History:  ?Diagnosis Date  ? Arrhythmia   ? Hx PAF, PVC's  ? CHF (congestive heart failure) ([place])   ? Coronary artery disease   ? 04/04/2017 STEMI  ? Diabetes ([place])   ? H/O mitral valve replacement 09/21/2021  ? Heart attack ([place])   ? per pt reprot  ? Hyperlipidemia   ?  Hypertension   ? New onset atrial fibrillation ([place]) 05/2017  ? ? ?Current Outpatient Medications  ?Medication Sig Dispense Refill  ? acetaminophen (TYLENOL) 325 MG tablet Take 2 tablets (650 mg total) by mouth every 6 (six) hours as needed for mild pain or headache.    ? aspirin EC 81 MG tablet Take 81 mg by mouth daily.    ? atorvastatin (LIPITOR) 40 MG tablet TAKE 1 TABLET (40 MG TOTAL) BY MOUTH DAILY AT 6 PM. 90 tablet 3  ? carvedilol (COREG) 3.125 MG tablet TAKE 1 TABLET BY MOUTH 2 TIMES DAILY. 180 tablet 2  ? dapagliflozin propanediol (FARXIGA) 10 MG TABS tablet Take 10 mg by mouth  daily.    ? glipiZIDE (GLUCOTROL XL) 10 MG 24 hr tablet Take 1 tablet (10 mg total) by mouth daily with breakfast.    ? JANUVIA 100 MG tablet Take 100 mg by mouth daily. Patient has not started yet    ? magnesium oxide (MAG-OX) 400 MG tablet TAKE 1 TABLET BY MOUTH EVERY DAY 90 tablet 3  ? metFORMIN (GLUCOPHAGE) 1000 MG tablet Take 1,000 mg by mouth 2 (two) times daily with a meal.    ? patiromer (VELTASSA) 8.4 g packet Take 1 packet (8.4 g total) by mouth daily. 30 each 1  ? sacubitril-valsartan (ENTRESTO) 24-26 MG Take 1 tablet by mouth 2 (two) times daily. 60 tablet 2  ? warfarin (COUMADIN) 5 MG tablet Take 1 tablet (5 mg total) by mouth daily. 45 tablet 3  ? furosemide (LASIX) 80 MG tablet Take 1 tablet (80 mg total) by mouth daily. 90 tablet 1  ? ?No current facility-administered medications for this encounter.  ? ? ?Allergies  ?Allergen Reactions  ? Keflex [Cephalexin] Rash  ?  Skin on feet peeled.  ? Asa [Aspirin]   ?  325 mg ----heartburn ?Pt can take 81 mg  ? Lisinopril Cough  ? ? ?  ?Social History  ? ?Socioeconomic History  ? Marital status: Married  ?  Spouse name: Rosebud Sharpless  ? Number of children: 1  ? Years of education: Not on file  ? Highest education level: Bachelor's degree (e.g., BA, AB, BS)  ?Occupational History  ? 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 Smoking status: Never  ? Smokeless tobacco: Never  ?Vaping Use  ? Vaping Use: Never used  ?Substance and Sexual Activity  ? Alcohol use: No  ? Drug use: Yes  ?  Types: Marijuana  ?  Comment: In her 55 's  ? Sexual activity: Not on file  ?Other Topics Concern  ? Not on file  ?Social History Narrative  ? Not on file  ? ?Social Determinants of Health  ? ?Financial Resource Strain: Not on file  ?Food Insecurity: No Food Insecurity  ? Worried About Charity fundraiser in the Last Year: Never true  ? Ran Out of Food in the Last Year: Never true  ?Transportation Needs: No Transportation Needs  ? Lack of Transportation  (Medical): No  ? Lack of Transportation (Non-Medical): No  ?Physical Activity: Not on file  ?Stress: Not on file  ?Social Connections: Not on file  ?Intimate Partner Violence: Not on file  ? ? ?  ?Family History  ?Problem Relation Age of Onset  ? Heart attack Father 76  ? Breast cancer Maternal Grandmother   ? ? ?Vitals:  ? 04/17/22 1114  ?BP: 140/80  ?Pulse: 84  ?SpO2: 98%  ?Weight:  79.5 kg  ? ? ?PHYSICAL EXAM: ?General:  Well appearing. Ambulated into clinic. ?HEENT: normal ?Neck: supple. no JVD. Carotids 2+ bilat; no bruits. Enlarged thyroid. ?Cor: PMI nondisplaced. Regular rate & rhythm. No rubs, gallops or murmurs. ?Lungs: clear ?Abdomen: soft, nontender, nondistended.  ?Extremities: no cyanosis, clubbing, rash, edema ?Neuro: alert & oriented x 3, cranial nerves grossly intact. moves all 4 extremities w/o difficulty. Affect pleasant. ? ?ECG: SR with 1st degree AVB, 84 bpm, RBBB ? ? ?ASSESSMENT & PLAN: ?Chronic Systolic Heart Failure: Ischemic cardiomyopathy ?- Echo 02/06/2018 LVEF 35-40% ?- Echo 06/20 EF 35-40% ?- Echo 01/22: EF 30-35% ?- Echo 01/23 EF 25-30% (appears closer to 20% on review with Dr. Haroldine Laws) ?- Sustained VT on zio last month. ? Scar-induced. Worry d/t progressive HF. She initially wanted to think about ICD, now willing to consider. Discussed with Dr. Haroldine Laws. Will order LifeVest and refer back to EP for f/u. Arranged for [facility] later this week to assess bypass graft/native coronaries and hemodynamics. ?- Need to rule out amyloidosis and sarcoid given conduction system disease in arrhythmias. Unable to tolerate MRI d/t claustrophobia. PYP scheduled later this week. ?- NYHA II currently. Volume looks good. Continue 80 mg furosemide daily. Discussed when to take additional diuretic as needed ?- On coreg 3.125 mg BID. Will not increase further d/t baseline conduction system disease.  ?- Continue Wilder Glade  ?- Entresto and spiro previously stopped d/t hyperkalemia. Lokelma not covered by her  insurance. Last K 3.7. Will start Entresto 24/26 mg BID. Prescribed patiromer for her use at home in case needed.  ?- Had long discussion regarding limiting sodium intake ?- BMET/BNP today, BMET again in 1 week ?

## 2022-04-17 ENCOUNTER — Telehealth (HOSPITAL_COMMUNITY): Payer: Self-pay

## 2022-04-17 ENCOUNTER — Telehealth (HOSPITAL_COMMUNITY): Payer: Self-pay | Admitting: *Deleted

## 2022-04-17 ENCOUNTER — Other Ambulatory Visit (HOSPITAL_COMMUNITY): Payer: Self-pay

## 2022-04-17 ENCOUNTER — Ambulatory Visit (HOSPITAL_COMMUNITY)
Admit: 2022-04-17 | Discharge: 2022-04-17 | Disposition: A | Discharge status: Home / Self Care | Payer: No Typology Code available for payment source | Source: Ambulatory Visit | Attending: Physician Assistant | Admitting: Physician Assistant

## 2022-04-17 ENCOUNTER — Encounter (HOSPITAL_COMMUNITY): Payer: Self-pay

## 2022-04-17 ENCOUNTER — Telehealth (HOSPITAL_COMMUNITY): Payer: Self-pay | Admitting: Surgery

## 2022-04-17 VITALS — BP 140/80 | HR 84 | Wt 175.2 lb

## 2022-04-17 DIAGNOSIS — I110 Hypertensive heart disease with heart failure: Secondary | ICD-10-CM | POA: Diagnosis not present

## 2022-04-17 DIAGNOSIS — I480 Paroxysmal atrial fibrillation: Secondary | ICD-10-CM | POA: Diagnosis not present

## 2022-04-17 DIAGNOSIS — Z951 Presence of aortocoronary bypass graft: Secondary | ICD-10-CM | POA: Insufficient documentation

## 2022-04-17 DIAGNOSIS — E049 Nontoxic goiter, unspecified: Secondary | ICD-10-CM

## 2022-04-17 DIAGNOSIS — Z7901 Long term (current) use of anticoagulants: Secondary | ICD-10-CM | POA: Insufficient documentation

## 2022-04-17 DIAGNOSIS — E119 Type 2 diabetes mellitus without complications: Secondary | ICD-10-CM | POA: Diagnosis not present

## 2022-04-17 DIAGNOSIS — I5022 Chronic systolic (congestive) heart failure: Secondary | ICD-10-CM | POA: Diagnosis not present

## 2022-04-17 DIAGNOSIS — I472 Ventricular tachycardia, unspecified: Secondary | ICD-10-CM

## 2022-04-17 DIAGNOSIS — I2510 Atherosclerotic heart disease of native coronary artery without angina pectoris: Secondary | ICD-10-CM | POA: Diagnosis not present

## 2022-04-17 DIAGNOSIS — I4510 Unspecified right bundle-branch block: Secondary | ICD-10-CM | POA: Insufficient documentation

## 2022-04-17 DIAGNOSIS — I441 Atrioventricular block, second degree: Secondary | ICD-10-CM | POA: Insufficient documentation

## 2022-04-17 DIAGNOSIS — Z952 Presence of prosthetic heart valve: Secondary | ICD-10-CM | POA: Diagnosis not present

## 2022-04-17 DIAGNOSIS — Z79899 Other long term (current) drug therapy: Secondary | ICD-10-CM | POA: Insufficient documentation

## 2022-04-17 DIAGNOSIS — E041 Nontoxic single thyroid nodule: Secondary | ICD-10-CM | POA: Insufficient documentation

## 2022-04-17 DIAGNOSIS — Z7982 Long term (current) use of aspirin: Secondary | ICD-10-CM | POA: Insufficient documentation

## 2022-04-17 DIAGNOSIS — Z7984 Long term (current) use of oral hypoglycemic drugs: Secondary | ICD-10-CM | POA: Insufficient documentation

## 2022-04-17 DIAGNOSIS — Z953 Presence of xenogenic heart valve: Secondary | ICD-10-CM | POA: Insufficient documentation

## 2022-04-17 DIAGNOSIS — I255 Ischemic cardiomyopathy: Secondary | ICD-10-CM | POA: Diagnosis not present

## 2022-04-17 LAB — BASIC METABOLIC PANEL
Anion gap: 10 (ref 5–15)
BUN: 29 mg/dL — ABNORMAL HIGH (ref 8–23)
CO2: 24 mmol/L (ref 22–32)
Calcium: 9.1 mg/dL (ref 8.9–10.3)
Chloride: 107 mmol/L (ref 98–111)
Creatinine, Ser: 1.41 mg/dL — ABNORMAL HIGH (ref 0.44–1.00)
GFR, Estimated: 41 mL/min — ABNORMAL LOW (ref >60)
Glucose, Bld: 130 mg/dL — ABNORMAL HIGH (ref 70–99)
Potassium: 3.9 mmol/L (ref 3.5–5.1)
Sodium: 141 mmol/L (ref 135–145)

## 2022-04-17 LAB — PROTIME-INR
INR: 2.2 — ABNORMAL HIGH (ref 0.8–1.2)
Prothrombin Time: 23.9 seconds — ABNORMAL HIGH (ref 11.4–15.2)

## 2022-04-17 LAB — BRAIN NATRIURETIC PEPTIDE: B Natriuretic Peptide: 666.7 pg/mL — ABNORMAL HIGH (ref 0.0–100.0)

## 2022-04-17 MED ORDER — VELTASSA 8.4 G PO PACK
8.4000 g | PACK | Freq: Every day | ORAL | 1 refills | Status: DC
Start: 2022-04-17 — End: 2022-04-17

## 2022-04-17 MED ORDER — FUROSEMIDE 80 MG PO TABS: 80.0000 mg | ORAL_TABLET | Freq: Every day | ORAL | 1 refills | Status: DC

## 2022-04-17 MED ORDER — ENTRESTO 24-26 MG PO TABS
1.0000 | ORAL_TABLET | Freq: Two times a day (BID) | ORAL | 2 refills | Status: DC
Start: 2022-04-17 — End: 2022-05-11

## 2022-04-17 NOTE — Telephone Encounter (Signed)
Heart Failure Patient Advocate Encounter ?  ?Received notification from [facility] that prior authorization for Daryll Drown is required. ?  ?PA submitted on CoverMyMeds ?[place] ?Status is pending ?  ?Will continue to follow. ? ?Kerby Nora, PharmD, BCPS ?Heart Failure Stewardship Pharmacist ?Phone [phone] ? ? ? ? ?

## 2022-04-17 NOTE — Progress Notes (Signed)
[employer] paper work faxed for [employer]. ? ?Spoke to [employer] about patient.Patient will be fitted with Life Vest today ?

## 2022-04-17 NOTE — Telephone Encounter (Signed)
Cath auth request faxed to [employer]  

## 2022-04-17 NOTE — Telephone Encounter (Signed)
I contacted patient to review results and recommendations per provider.  She is aware and agreeable.  Patient has lab appt on 5/22.  Medication list updated in [facility]. ?

## 2022-04-17 NOTE — Telephone Encounter (Signed)
-----   Message from Andrey Farmer, [place] sent at 04/17/2022  2:23 PM EDT ----- ?INR 2.2. Will need to hold warfarin for cath. To start holding today per MD. K stable and BNP trending down. Okay to start entresto. DO not start [place] yet. BMET in 1 week. ? ?

## 2022-04-17 NOTE — Progress Notes (Signed)
Spoke to patient prior to leaving visit,as per Dr.Bensimhon patient to hold Coumadin dose May 15-18 2023.Patient aware and agreeable to plan ?

## 2022-04-17 NOTE — Patient Instructions (Signed)
Good to see you today! ? ?START Entresto 24/26 mg tablet Twice daily ? ?Valtesssa 8.4 mg daily ONLY take as directed by  clinic ? ?Lab work done today we will call you with any abnormal results ? ?Follow up lab  work in 10 days ? ?You have been referred to electrophysiology for ICD evaluation ? ? ?Your physician recommends that you schedule a follow-up appointment in: 3-4 weeks w app clinic ? ?You have been referred for a Life vest ? ?You are scheduled for a Cardiac Catheterization on Thursday, May 18 with Dr. Glori Bickers. ? ?1. Please arrive at the Main Entrance A at [facility]: [place], [place] [address] at 7:00 AM (This time is two hours before your procedure to ensure your preparation). Free valet parking service is available.  ? ?Special note: Every effort is made to have your procedure done on time. Please understand that emergencies sometimes delay scheduled procedures. ? ?2. Diet: Do not eat solid foods after midnight.  You may have clear liquids until 5 AM upon the day of the procedure. ? ?3. Labs:  ? ? ?4. Medication instructions in preparation for your procedure: ? ? ?Hold lasix, metformin,glipizide,Januvia  ,farxiga on day of procedure ? ?Coumadin we  will call you on when to hold ? ? ? ?On the morning of your procedure, take any morning medicines NOT listed above.  You may use sips of water. ? ?5. Plan to go home the same day, you will only stay overnight if medically necessary. ?6. You MUST have a responsible adult to drive you home. ?7. An adult MUST be with you the first 24 hours after you arrive home. ?8. Bring a current list of your medications, and the last time and date medication taken. ?9. Bring ID and current insurance cards. ?10.Please wear clothes that are easy to get on and off and wear slip-on shoes. ? ?Thank you for allowing [place] to care for you! ?  -- [facility] ? ? ?If you have any questions or concerns before your next  appointment please send [place] a message through [place] or call our office at [phone].   ? ?TO LEAVE A MESSAGE FOR THE NURSE SELECT OPTION 2, PLEASE LEAVE A MESSAGE INCLUDING: ?YOUR NAME ?DATE OF BIRTH ?CALL BACK NUMBER ?REASON FOR CALL**this is important as we prioritize the call backs ? ?YOU WILL RECEIVE A CALL BACK THE SAME DAY AS LONG AS YOU CALL BEFORE 4:00 PM ? ?At the [facility], you and your health needs are our priority. As part of our continuing mission to provide you with exceptional heart care, we have created designated Provider Care Teams. These Care Teams include your primary Cardiologist (physician) and Advanced Practice Providers (APPs- Physician Assistants and Nurse Practitioners) who all work together to provide you with the care you need, when you need it.  ? ?You may see any of the following providers on your designated Care Team at your next follow up: ?Dr Glori Bickers ?Dr Loralie Champagne ?Darrick Grinder, NP ?Lyda Jester, PA ?Jessica Milford,NP ?Marlyce Huge, PA ?Audry Riles, PharmD ? ? ?Please be sure to bring in all your medications bottles to every appointment.  ? ?   ?

## 2022-04-17 NOTE — Telephone Encounter (Signed)
Called to confirm Heart & Vascular Transitions of Care appointment at 11 am on 04/17/22. Patient reminded to bring all medications and pill box organizer with them. Confirmed patient has transportation. Gave directions, instructed to utilize valet parking. ? ?Confirmed appointment prior to ending call.   ? ?Rhae Hammock, BSN, RN ?Heart Failure Nurse Navigator ?Secure Chat Only  ?

## 2022-04-17 NOTE — Addendum Note (Signed)
Encounter addended by: Suezanne Cheshire, RN on: 04/17/2022 4:04 PM ? Actions taken: Clinical Note Signed

## 2022-04-17 NOTE — Progress Notes (Signed)
[facility] V[facility] Transitions of Care Clinic ?Heart Failure Pharmacist Encounter ? ?PCP: Deatra James, MD ?PCP-Cardiologist: Chilton Si, MD ? ?HPI:  ?66 yo F with PMH of CAD s/p CABG, CHF, MVR, PVCs, and T2DM. She was admitted from 5/1-5/4 with acute on chronic heart failure after not taking her lasix. She was discharged on the same HF regimen as admission. Her last ECHO was done on 1/5 and LVEF was 25-30% with moderately reduced RV. Stress test on 3/2 with findings consistent with prior MI and EF 17%. She had a Zio patch in March as well and findings showed 6 runs of VT with the fastest interval lasting 27-28 minutes. Scheduled for a cMRI in May with Dr. Graciela Husbands.  ? ?Today, Rachael Davis presents to the Heart Failure TOC Clinic for follow up. She reports improvement in symptoms. Denies SOB, orthopnea, dyspnea, edema, lightheadedness, and dizziness. She is not taking daily weights at home. Does not have a BP cuff. She is taking her medications as prescribed and is following a low-sodium and fluid-restricted diet. ? ?HF Medications: ?Carvedilol 3.125 mg BID ?Farxiga 10 mg daily ?Furosemide 80 mg daily ? ?Has the patient been experiencing any side effects to the medications prescribed?  no ? ?Does the patient have any problems obtaining medications due to transportation or finances?   No - has Hospital doctor ? ?Understanding of regimen: good ?Understanding of indications: good ?Potential of compliance: fair ?Patient understands to avoid NSAIDs. ?Patient understands to avoid decongestants. ?  ?Pertinent Lab Values: ?Serum creatinine 1.41, BUN 29, Potassium 3.9, Sodium 141, BNP 666.7 ? ?Vital Signs: ?Weight: 175 lbs (discharge weight: 168 lbs) ?Blood pressure: 140/80  ?Heart rate: 84  ? ?Medication Assistance / Insurance Benefits Check: ?Does the patient have prescription insurance?  Yes ?Type of insurance plan: Monia Pouch - commercial insurance ? ?Outpatient Pharmacy:  ?Current outpatient [facility] ?Was the [facility] pharmacy used to supply discharge medications? no ?Is the patient willing to transition their outpatient pharmacy to utilize a [facility] outpatient pharmacy with or without mail order?   No - insurance not contracted with [facility] ? ?Assessment: ?1) Chronic systolic CHF (EF 63%), due to ICM. NYHA class II symptoms. R/LHC scheduled for this Thursday. LifeVest authorization initiated.  ?- Continue carvedilol 3.125 mg BID ?- Start Entresto 24/26 mg BID, recheck BMET in 1 week ?- If K elevated at follow up lab, start Veltassa 8.4 g daily (Lokelma not on formulary) ?- Continue Farxiga 10 mg daily ?- Continue furosemide 80 mg daily ? ?Plan: ?1) Medication changes: ?- Start Entresto 24/26 mg BID ?- Provided monthly copay card ?- BMET in 1 week ?- Veltassa 8.4 g daily if needed on repeat labs ? ?2) Patient Assistance: ?- Will contact pharmacy to ensure Veltassa covered - insurance not contracted with [facility] ? ?3) Follow up: ?- Next appointment with lab in 1 week and APP clinic on 6/8 ? ?Sharen Hones, PharmD, BCPS ?Heart Failure Transitions of Care Clinic Pharmacist ?[phone] ? ?

## 2022-04-18 ENCOUNTER — Ambulatory Visit (INDEPENDENT_AMBULATORY_CARE_PROVIDER_SITE_OTHER)
Payer: No Typology Code available for payment source | Admitting: Pharmacist Clinician (PhC)/ Clinical Pharmacy Specialist

## 2022-04-18 ENCOUNTER — Telehealth (HOSPITAL_COMMUNITY): Payer: Self-pay | Admitting: *Deleted

## 2022-04-18 DIAGNOSIS — Z7901 Long term (current) use of anticoagulants: Secondary | ICD-10-CM

## 2022-04-18 DIAGNOSIS — Z951 Presence of aortocoronary bypass graft: Secondary | ICD-10-CM

## 2022-04-18 DIAGNOSIS — I480 Paroxysmal atrial fibrillation: Secondary | ICD-10-CM | POA: Diagnosis not present

## 2022-04-18 NOTE — Telephone Encounter (Signed)
Close encounter 

## 2022-04-19 ENCOUNTER — Telehealth: Payer: Self-pay

## 2022-04-19 ENCOUNTER — Ambulatory Visit (HOSPITAL_COMMUNITY)
Admission: RE | Admit: 2022-04-19 | Discharge: 2022-04-19 | Disposition: A | Discharge status: Home / Self Care | Payer: No Typology Code available for payment source | Source: Ambulatory Visit | Attending: Internal Medicine | Admitting: Internal Medicine

## 2022-04-19 DIAGNOSIS — I480 Paroxysmal atrial fibrillation: Secondary | ICD-10-CM

## 2022-04-19 DIAGNOSIS — I5020 Unspecified systolic (congestive) heart failure: Secondary | ICD-10-CM | POA: Diagnosis present

## 2022-04-19 DIAGNOSIS — I255 Ischemic cardiomyopathy: Secondary | ICD-10-CM | POA: Diagnosis not present

## 2022-04-19 MED ORDER — TECHNETIUM TC 99M PYROPHOSPHATE
21.5000 | Freq: Once | INTRAVENOUS | Status: AC
  Administered 2022-04-19: 21.5 via INTRAVENOUS

## 2022-04-19 NOTE — Telephone Encounter (Signed)
done

## 2022-04-20 ENCOUNTER — Encounter (HOSPITAL_COMMUNITY): Payer: Self-pay | Admitting: Internal Medicine

## 2022-04-20 ENCOUNTER — Ambulatory Visit (HOSPITAL_COMMUNITY)
Admission: RE | Disposition: A | Discharge status: Home / Self Care | Payer: Self-pay | Source: Home / Self Care | Attending: Internal Medicine

## 2022-04-20 ENCOUNTER — Ambulatory Visit (HOSPITAL_BASED_OUTPATIENT_CLINIC_OR_DEPARTMENT_OTHER): Payer: No Typology Code available for payment source | Admitting: Cardiovascular Disease

## 2022-04-20 ENCOUNTER — Ambulatory Visit (HOSPITAL_COMMUNITY)
Admission: RE | Admit: 2022-04-20 | Discharge: 2022-04-20 | Disposition: A | Discharge status: Home / Self Care | Payer: No Typology Code available for payment source | Attending: Internal Medicine | Admitting: Internal Medicine

## 2022-04-20 ENCOUNTER — Other Ambulatory Visit: Payer: Self-pay

## 2022-04-20 DIAGNOSIS — E049 Nontoxic goiter, unspecified: Secondary | ICD-10-CM | POA: Diagnosis not present

## 2022-04-20 DIAGNOSIS — Z7901 Long term (current) use of anticoagulants: Secondary | ICD-10-CM | POA: Insufficient documentation

## 2022-04-20 DIAGNOSIS — I480 Paroxysmal atrial fibrillation: Secondary | ICD-10-CM | POA: Diagnosis not present

## 2022-04-20 DIAGNOSIS — I110 Hypertensive heart disease with heart failure: Secondary | ICD-10-CM | POA: Insufficient documentation

## 2022-04-20 DIAGNOSIS — I272 Pulmonary hypertension, unspecified: Secondary | ICD-10-CM | POA: Diagnosis not present

## 2022-04-20 DIAGNOSIS — I5022 Chronic systolic (congestive) heart failure: Secondary | ICD-10-CM | POA: Diagnosis not present

## 2022-04-20 DIAGNOSIS — Z951 Presence of aortocoronary bypass graft: Secondary | ICD-10-CM | POA: Insufficient documentation

## 2022-04-20 DIAGNOSIS — I25810 Atherosclerosis of coronary artery bypass graft(s) without angina pectoris: Secondary | ICD-10-CM | POA: Diagnosis not present

## 2022-04-20 DIAGNOSIS — E119 Type 2 diabetes mellitus without complications: Secondary | ICD-10-CM | POA: Diagnosis not present

## 2022-04-20 DIAGNOSIS — Z953 Presence of xenogenic heart valve: Secondary | ICD-10-CM | POA: Diagnosis not present

## 2022-04-20 DIAGNOSIS — I255 Ischemic cardiomyopathy: Secondary | ICD-10-CM | POA: Diagnosis not present

## 2022-04-20 DIAGNOSIS — I2582 Chronic total occlusion of coronary artery: Secondary | ICD-10-CM | POA: Diagnosis not present

## 2022-04-20 DIAGNOSIS — I472 Ventricular tachycardia, unspecified: Secondary | ICD-10-CM | POA: Diagnosis not present

## 2022-04-20 DIAGNOSIS — I050 Rheumatic mitral stenosis: Secondary | ICD-10-CM | POA: Diagnosis not present

## 2022-04-20 DIAGNOSIS — Z79899 Other long term (current) drug therapy: Secondary | ICD-10-CM | POA: Insufficient documentation

## 2022-04-20 DIAGNOSIS — I2510 Atherosclerotic heart disease of native coronary artery without angina pectoris: Secondary | ICD-10-CM

## 2022-04-20 DIAGNOSIS — Z7982 Long term (current) use of aspirin: Secondary | ICD-10-CM | POA: Insufficient documentation

## 2022-04-20 HISTORY — PX: RIGHT/LEFT HEART CATH AND CORONARY/GRAFT ANGIOGRAPHY: CATH118267

## 2022-04-20 LAB — PROTIME-INR
INR: 1.9 — ABNORMAL HIGH (ref 0.8–1.2)
Prothrombin Time: 21.5 seconds — ABNORMAL HIGH (ref 11.4–15.2)

## 2022-04-20 LAB — GLUCOSE, CAPILLARY
Glucose-Capillary: 72 mg/dL (ref 70–99)
Glucose-Capillary: 49 mg/dL — ABNORMAL LOW (ref 70–99)
Glucose-Capillary: 99 mg/dL (ref 70–99)
Glucose-Capillary: 52 mg/dL — ABNORMAL LOW (ref 70–99)
Glucose-Capillary: 108 mg/dL — ABNORMAL HIGH (ref 70–99)

## 2022-04-20 LAB — CARDIAC CATHETERIZATION: Cath EF Quantitative: 25 %

## 2022-04-20 SURGERY — RIGHT/LEFT HEART CATH AND CORONARY/GRAFT ANGIOGRAPHY
Anesthesia: LOCAL

## 2022-04-20 MED ORDER — SODIUM CHLORIDE 0.9 % IV SOLN: INTRAVENOUS | Status: DC

## 2022-04-20 MED ORDER — HEPARIN (PORCINE) IN NACL 1000-0.9 UT/500ML-% IV SOLN
INTRAVENOUS | Status: DC | PRN
  Administered 2022-04-20 (×2): 500 mL

## 2022-04-20 MED ORDER — LIDOCAINE HCL (PF) 1 % IJ SOLN
INTRAMUSCULAR | Status: AC
  Filled 2022-04-20: qty 30

## 2022-04-20 MED ORDER — DEXTROSE 50 % IV SOLN
25.0000 mL | Freq: Once | INTRAVENOUS | Status: AC
  Administered 2022-04-20: 25 mL via INTRAVENOUS

## 2022-04-20 MED ORDER — ASPIRIN 81 MG PO CHEW: 81.0000 mg | CHEWABLE_TABLET | ORAL | Status: AC

## 2022-04-20 MED ORDER — SODIUM CHLORIDE 0.9% FLUSH: 3.0000 mL | Freq: Two times a day (BID) | INTRAVENOUS | Status: DC

## 2022-04-20 MED ORDER — ASPIRIN 81 MG PO CHEW
CHEWABLE_TABLET | ORAL | Status: AC
  Administered 2022-04-20: 81 mg via ORAL
  Filled 2022-04-20: qty 1

## 2022-04-20 MED ORDER — MIDAZOLAM HCL 2 MG/2ML IJ SOLN
INTRAMUSCULAR | Status: DC | PRN
  Administered 2022-04-20: 1 mg via INTRAVENOUS

## 2022-04-20 MED ORDER — ACETAMINOPHEN 325 MG PO TABS: 650.0000 mg | ORAL_TABLET | ORAL | Status: DC | PRN

## 2022-04-20 MED ORDER — VERAPAMIL HCL 2.5 MG/ML IV SOLN
INTRAVENOUS | Status: AC
  Filled 2022-04-20: qty 2

## 2022-04-20 MED ORDER — HYDRALAZINE HCL 20 MG/ML IJ SOLN: 10.0000 mg | INTRAMUSCULAR | Status: DC | PRN

## 2022-04-20 MED ORDER — DEXTROSE 50 % IV SOLN
INTRAVENOUS | Status: AC
  Filled 2022-04-20: qty 50

## 2022-04-20 MED ORDER — HEPARIN (PORCINE) IN NACL 1000-0.9 UT/500ML-% IV SOLN
INTRAVENOUS | Status: AC
  Filled 2022-04-20: qty 1000

## 2022-04-20 MED ORDER — SODIUM CHLORIDE 0.9 % IV SOLN: 250.0000 mL | INTRAVENOUS | Status: DC | PRN

## 2022-04-20 MED ORDER — IOHEXOL 350 MG/ML SOLN
INTRAVENOUS | Status: DC | PRN
  Administered 2022-04-20: 60 mL

## 2022-04-20 MED ORDER — FENTANYL CITRATE (PF) 100 MCG/2ML IJ SOLN
INTRAMUSCULAR | Status: AC
  Filled 2022-04-20: qty 2

## 2022-04-20 MED ORDER — LIDOCAINE HCL (PF) 1 % IJ SOLN
INTRAMUSCULAR | Status: DC | PRN
  Administered 2022-04-20: 1 mL
  Administered 2022-04-20: 2 mL

## 2022-04-20 MED ORDER — SODIUM CHLORIDE 0.9% FLUSH: 3.0000 mL | INTRAVENOUS | Status: DC | PRN

## 2022-04-20 MED ORDER — VERAPAMIL HCL 2.5 MG/ML IV SOLN
INTRAVENOUS | Status: DC | PRN
  Administered 2022-04-20: 10 mL via INTRA_ARTERIAL

## 2022-04-20 MED ORDER — MIDAZOLAM HCL 2 MG/2ML IJ SOLN
INTRAMUSCULAR | Status: AC
  Filled 2022-04-20: qty 2

## 2022-04-20 MED ORDER — HEPARIN SODIUM (PORCINE) 1000 UNIT/ML IJ SOLN
INTRAMUSCULAR | Status: DC | PRN
Start: 2022-04-20 — End: 2022-04-20
  Administered 2022-04-20: 3500 [IU] via INTRAVENOUS

## 2022-04-20 MED ORDER — FENTANYL CITRATE (PF) 100 MCG/2ML IJ SOLN
INTRAMUSCULAR | Status: DC | PRN
  Administered 2022-04-20: 25 ug via INTRAVENOUS

## 2022-04-20 MED ORDER — ONDANSETRON HCL 4 MG/2ML IJ SOLN: 4.0000 mg | Freq: Four times a day (QID) | INTRAMUSCULAR | Status: DC | PRN

## 2022-04-20 MED ORDER — LABETALOL HCL 5 MG/ML IV SOLN: 10.0000 mg | INTRAVENOUS | Status: DC | PRN

## 2022-04-20 SURGICAL SUPPLY — 15 items
BAND CMPR LRG ZPHR (HEMOSTASIS) ×1
BAND ZEPHYR COMPRESS 30 LONG (HEMOSTASIS) ×1 IMPLANT
CATH BALLN WEDGE 5F 110CM (CATHETERS) ×1 IMPLANT
CATH INFINITI 5 FR RCB (CATHETERS) ×1 IMPLANT
CATH INFINITI 5FR AL1 (CATHETERS) ×1 IMPLANT
CATH INFINITI 5FR MULTPACK ANG (CATHETERS) ×1 IMPLANT
GLIDESHEATH SLEND SS 6F .021 (SHEATH) ×1 IMPLANT
GUIDEWIRE .025 260CM (WIRE) ×1 IMPLANT
GUIDEWIRE INQWIRE 1.5J.035X260 (WIRE) IMPLANT
INQWIRE 1.5J .035X260CM (WIRE) ×2
PACK CARDIAC CATHETERIZATION (CUSTOM PROCEDURE TRAY) ×3 IMPLANT
SHEATH GLIDE SLENDER 4/5FR (SHEATH) ×1 IMPLANT
TRANSDUCER W/STOPCOCK (MISCELLANEOUS) ×4 IMPLANT
TUBING ART PRESS 72  MALE/FEM (TUBING) ×2
TUBING ART PRESS 72 MALE/FEM (TUBING) IMPLANT

## 2022-04-20 NOTE — Telephone Encounter (Signed)
Pt is scheduled for 05/30/2022 for follow up with Dr Graciela Husbands post hospital.

## 2022-04-20 NOTE — Progress Notes (Signed)
Inpatient Diabetes Program Recommendations  AACE/ADA: New Consensus Statement on Inpatient Glycemic Control (2015)  Target Ranges:  Prepandial:   less than 140 mg/dL      Peak postprandial:   less than 180 mg/dL (1-2 hours)      Critically ill patients:  140 - 180 mg/dL   Lab Results  Component Value Date   GLUCAP 99 04/20/2022   HGBA1C 6.7 (H) 04/03/2022    Review of Glycemic Control  Latest Reference Range & Units 04/20/22 08:31 04/20/22 08:58  Glucose-Capillary 70 - 99 mg/dL 49 (L) 99  (L): Data is abnormally low Diabetes history: Type 2 DM Outpatient Diabetes medications: Glipizide 10 mg QA, Metformin 1000 mg BId, Farxiga 10 mg QD, Januvia 100 mg QD Current orders for Inpatient glycemic control: none  Inpatient Diabetes Program Recommendations:   Noted hypoglycemia this AM, consider adding CBGs TId & HS.   Thanks, Lujean Rave, MSN, RNC-OB Diabetes Coordinator [phone] (8a-5p)

## 2022-04-20 NOTE — Interval H&P Note (Signed)
History and Physical Interval Note:  04/20/2022 9:43 AM  Rachael Davis  has presented today for surgery, with the diagnosis of heart failure - vt.  The various methods of treatment have been discussed with the patient and family. After consideration of risks, benefits and other options for treatment, the patient has consented to  Procedure(s): RIGHT/LEFT HEART CATH AND CORONARY/GRAFT ANGIOGRAPHY (N/A) and possible coronary angioplasty as a surgical intervention.  The patient's history has been reviewed, patient examined, no change in status, stable for surgery.  I have reviewed the patient's chart and labs.  Questions were answered to the patient's satisfaction.     [name]

## 2022-04-21 LAB — POCT I-STAT EG7
Acid-Base Excess: 0 mmol/L (ref 0.0–2.0)
Acid-base deficit: 1 mmol/L (ref 0.0–2.0)
Bicarbonate: 23.4 mmol/L (ref 20.0–28.0)
Bicarbonate: 24.8 mmol/L (ref 20.0–28.0)
Calcium, Ion: 1.24 mmol/L (ref 1.15–1.40)
Calcium, Ion: 1.26 mmol/L (ref 1.15–1.40)
HCT: 33 % — ABNORMAL LOW (ref 36.0–46.0)
HCT: 36 % (ref 36.0–46.0)
Hemoglobin: 11.2 g/dL — ABNORMAL LOW (ref 12.0–15.0)
Hemoglobin: 12.2 g/dL (ref 12.0–15.0)
O2 Saturation: 60 %
O2 Saturation: 63 %
Potassium: 3.6 mmol/L (ref 3.5–5.1)
Potassium: 3.9 mmol/L (ref 3.5–5.1)
Sodium: 143 mmol/L (ref 135–145)
Sodium: 145 mmol/L (ref 135–145)
TCO2: 24 mmol/L (ref 22–32)
TCO2: 26 mmol/L (ref 22–32)
pCO2, Ven: 37.8 mmHg — ABNORMAL LOW (ref 44–60)
pCO2, Ven: 40.7 mmHg — ABNORMAL LOW (ref 44–60)
pH, Ven: 7.394 (ref 7.25–7.43)
pH, Ven: 7.399 (ref 7.25–7.43)
pO2, Ven: 31 mmHg — CL (ref 32–45)
pO2, Ven: 33 mmHg (ref 32–45)

## 2022-04-21 LAB — POCT I-STAT 7, (LYTES, BLD GAS, ICA,H+H)
Acid-base deficit: 3 mmol/L — ABNORMAL HIGH (ref 0.0–2.0)
Bicarbonate: 21.5 mmol/L (ref 20.0–28.0)
Calcium, Ion: 1.12 mmol/L — ABNORMAL LOW (ref 1.15–1.40)
HCT: 33 % — ABNORMAL LOW (ref 36.0–46.0)
Hemoglobin: 11.2 g/dL — ABNORMAL LOW (ref 12.0–15.0)
O2 Saturation: 94 %
Potassium: 3.7 mmol/L (ref 3.5–5.1)
Sodium: 145 mmol/L (ref 135–145)
TCO2: 22 mmol/L (ref 22–32)
pCO2 arterial: 34.5 mmHg (ref 32–48)
pH, Arterial: 7.402 (ref 7.35–7.45)
pO2, Arterial: 69 mmHg — ABNORMAL LOW (ref 83–108)

## 2022-04-21 MED FILL — Verapamil HCl IV Soln 2.5 MG/ML: INTRAVENOUS | Qty: 2 | Status: AC

## 2022-04-24 ENCOUNTER — Other Ambulatory Visit (HOSPITAL_COMMUNITY): Payer: Self-pay

## 2022-04-24 ENCOUNTER — Ambulatory Visit (HOSPITAL_COMMUNITY)
Admission: RE | Admit: 2022-04-24 | Discharge: 2022-04-24 | Disposition: A | Discharge status: Home / Self Care | Payer: No Typology Code available for payment source | Source: Ambulatory Visit | Attending: Cardiology | Admitting: Cardiology

## 2022-04-24 DIAGNOSIS — I472 Ventricular tachycardia, unspecified: Secondary | ICD-10-CM | POA: Insufficient documentation

## 2022-04-24 LAB — BASIC METABOLIC PANEL
Anion gap: 7 (ref 5–15)
BUN: 24 mg/dL — ABNORMAL HIGH (ref 8–23)
CO2: 24 mmol/L (ref 22–32)
Calcium: 9.5 mg/dL (ref 8.9–10.3)
Chloride: 110 mmol/L (ref 98–111)
Creatinine, Ser: 1.31 mg/dL — ABNORMAL HIGH (ref 0.44–1.00)
GFR, Estimated: 45 mL/min — ABNORMAL LOW (ref >60)
Glucose, Bld: 112 mg/dL — ABNORMAL HIGH (ref 70–99)
Potassium: 5.6 mmol/L — ABNORMAL HIGH (ref 3.5–5.1)
Sodium: 141 mmol/L (ref 135–145)

## 2022-04-24 NOTE — Telephone Encounter (Addendum)
PA denied - insurance states Rachael Davis is formulary alterative

## 2022-04-25 ENCOUNTER — Other Ambulatory Visit (HOSPITAL_COMMUNITY): Payer: Self-pay | Admitting: Family Medicine

## 2022-04-25 DIAGNOSIS — I5020 Unspecified systolic (congestive) heart failure: Secondary | ICD-10-CM

## 2022-04-27 ENCOUNTER — Ambulatory Visit (HOSPITAL_COMMUNITY)
Admission: RE | Admit: 2022-04-27 | Discharge: 2022-04-27 | Disposition: A | Discharge status: Home / Self Care | Payer: No Typology Code available for payment source | Source: Ambulatory Visit | Attending: Internal Medicine | Admitting: Internal Medicine

## 2022-04-27 DIAGNOSIS — I5020 Unspecified systolic (congestive) heart failure: Secondary | ICD-10-CM | POA: Insufficient documentation

## 2022-04-27 LAB — BASIC METABOLIC PANEL
Anion gap: 10 (ref 5–15)
BUN: 31 mg/dL — ABNORMAL HIGH (ref 8–23)
CO2: 25 mmol/L (ref 22–32)
Calcium: 9.3 mg/dL (ref 8.9–10.3)
Chloride: 106 mmol/L (ref 98–111)
Creatinine, Ser: 1.54 mg/dL — ABNORMAL HIGH (ref 0.44–1.00)
GFR, Estimated: 37 mL/min — ABNORMAL LOW (ref >60)
Glucose, Bld: 81 mg/dL (ref 70–99)
Potassium: 4.3 mmol/L (ref 3.5–5.1)
Sodium: 141 mmol/L (ref 135–145)

## 2022-04-27 NOTE — Progress Notes (Signed)
Medication Samples have been provided to the patient.  Drug name: lokelma       Strength: 10g        Qty: 4 packets  LOT: [id number]  Exp.Date: 08/03/2024  Dosing instructions: ONE PACKET DISSOLVED  IN 4-6 OZ OF FLUID. DRINK UNTIL EMPTY   The patient has been instructed regarding the correct time, dose, and frequency of taking this medication, including desired effects and most common side effects.   Theresia Bough 9:09 AM 04/27/2022

## 2022-04-27 NOTE — Addendum Note (Signed)
Encounter addended by: Kerry Dory, CMA on: 04/27/2022 9:10 AM  Actions taken: Clinical Note Signed

## 2022-05-11 ENCOUNTER — Ambulatory Visit (INDEPENDENT_AMBULATORY_CARE_PROVIDER_SITE_OTHER): Payer: No Typology Code available for payment source | Admitting: *Deleted

## 2022-05-11 ENCOUNTER — Encounter (HOSPITAL_COMMUNITY): Payer: Self-pay

## 2022-05-11 ENCOUNTER — Ambulatory Visit (HOSPITAL_COMMUNITY)
Admission: RE | Admit: 2022-05-11 | Discharge: 2022-05-11 | Disposition: A | Discharge status: Home / Self Care | Payer: No Typology Code available for payment source | Source: Ambulatory Visit | Attending: Physician Assistant | Admitting: Physician Assistant

## 2022-05-11 VITALS — BP 136/84 | HR 86 | Wt 179.5 lb

## 2022-05-11 DIAGNOSIS — I110 Hypertensive heart disease with heart failure: Secondary | ICD-10-CM | POA: Insufficient documentation

## 2022-05-11 DIAGNOSIS — I428 Other cardiomyopathies: Secondary | ICD-10-CM | POA: Diagnosis not present

## 2022-05-11 DIAGNOSIS — Z951 Presence of aortocoronary bypass graft: Secondary | ICD-10-CM

## 2022-05-11 DIAGNOSIS — I2510 Atherosclerotic heart disease of native coronary artery without angina pectoris: Secondary | ICD-10-CM | POA: Insufficient documentation

## 2022-05-11 DIAGNOSIS — I5022 Chronic systolic (congestive) heart failure: Secondary | ICD-10-CM | POA: Insufficient documentation

## 2022-05-11 DIAGNOSIS — Z953 Presence of xenogenic heart valve: Secondary | ICD-10-CM | POA: Insufficient documentation

## 2022-05-11 DIAGNOSIS — I441 Atrioventricular block, second degree: Secondary | ICD-10-CM | POA: Diagnosis not present

## 2022-05-11 DIAGNOSIS — Z7901 Long term (current) use of anticoagulants: Secondary | ICD-10-CM | POA: Insufficient documentation

## 2022-05-11 DIAGNOSIS — I472 Ventricular tachycardia, unspecified: Secondary | ICD-10-CM | POA: Diagnosis not present

## 2022-05-11 DIAGNOSIS — Z952 Presence of prosthetic heart valve: Secondary | ICD-10-CM | POA: Diagnosis not present

## 2022-05-11 DIAGNOSIS — E119 Type 2 diabetes mellitus without complications: Secondary | ICD-10-CM | POA: Diagnosis not present

## 2022-05-11 DIAGNOSIS — I480 Paroxysmal atrial fibrillation: Secondary | ICD-10-CM

## 2022-05-11 DIAGNOSIS — E041 Nontoxic single thyroid nodule: Secondary | ICD-10-CM | POA: Diagnosis not present

## 2022-05-11 DIAGNOSIS — Z8249 Family history of ischemic heart disease and other diseases of the circulatory system: Secondary | ICD-10-CM | POA: Diagnosis not present

## 2022-05-11 DIAGNOSIS — Z79899 Other long term (current) drug therapy: Secondary | ICD-10-CM | POA: Diagnosis not present

## 2022-05-11 LAB — BASIC METABOLIC PANEL
Anion gap: 9 (ref 5–15)
BUN: 20 mg/dL (ref 8–23)
CO2: 24 mmol/L (ref 22–32)
Calcium: 8.9 mg/dL (ref 8.9–10.3)
Chloride: 106 mmol/L (ref 98–111)
Creatinine, Ser: 1.33 mg/dL — ABNORMAL HIGH (ref 0.44–1.00)
GFR, Estimated: 44 mL/min — ABNORMAL LOW (ref >60)
Glucose, Bld: 108 mg/dL — ABNORMAL HIGH (ref 70–99)
Potassium: 4.1 mmol/L (ref 3.5–5.1)
Sodium: 139 mmol/L (ref 135–145)

## 2022-05-11 LAB — POCT INR: INR: 2.7 (ref 2.0–3.0)

## 2022-05-11 LAB — BRAIN NATRIURETIC PEPTIDE: B Natriuretic Peptide: 512.2 pg/mL — ABNORMAL HIGH (ref 0.0–100.0)

## 2022-05-11 MED ORDER — ISOSORB DINITRATE-HYDRALAZINE 20-37.5 MG PO TABS: 0.5000 | ORAL_TABLET | Freq: Three times a day (TID) | ORAL | 11 refills | Status: DC

## 2022-05-11 NOTE — Progress Notes (Addendum)
ADVANCED HEART FAILURE CLINIC NOTE     Primary Care: Dr. Wynelle Link Primary Cardiologist: Dr. Duke Salvia HF: Dr. Gala Romney  HPI: Referred to clinic by Dr. Ella Jubilee for heart failure consultation. 66 y.o. 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   She was previously followed by Dr. Gala Romney in Advanced Heart Failure clinic, graduated from clinic in 05/2019. Echo 05/2019: EF 35-40%. Has been followed by Dr. Duke Salvia with Cardiology.  Echo 01/22: EF 30-35%  Echo 01/23: EF 25-30%, RV moderately reduced, RVSP 38.8 mmHg, trivial MR, mean gradient 7 mmHg across mitral valve prosthesis, IVC dilated with estimated RAP 15 mmHg  Saw EP in 03/23. D/t presence of progressive conduction system disease workup for amyloidosis recommended. Unable to tolerate cMRI d/t claustrophobia.   7 day Zio 04/23: SR with avg rate 83 bpm, 1st degree AVB and intermittent Wenckebach AV block noted. 2.3% PVCs. 6 runs VT, longest 27 min and 48 seconds. ICD recommended. She wanted to discuss with family before proceeding.  Admitted 05/23 with a/c CHF. Diuresed with IV lasix. Discharged on furosemide 80 mg daily. GDMT - coreg 3.125 mg BID and dapagliflozin 10 mg daily.   Seen in [facility] clinic 04/17/22. Added back Entresto, later stopped when she developed hyperkalemia. Felt better when she was on Entresto in the past. LifeVest placed dt/ episode sustained VT on monitor in April.  PYP scan 04/20/22 equivocal for TTR amyloidosis.  R/LHC 04/20/22: LIMA to LAD and SVG to OM patent, occluded SVG to RCA and SVG to Ramus, normal LVEDP, moderate mixed PAH, severe mitral stenosis (felt to be overestimated). Med management of CAD recommended, if has refractory angina could consider CTO RCA.  She is here today for follow-up. Has been doing well. No significant dyspnea, orthopnea, PND or lower extremity edema. She does  not exercise regularly but is able to walk at least a block without limitation. No CP, palpitations or dizziness. Tolerating medications well. She has been trying to watch sodium intake. Reports she eats out some as she does not cook, eats salads from [employer].   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   Review of Systems: Cardiac and respiratory. Negative except as mentioned in HPI.   Past Medical History:  Diagnosis Date   Arrhythmia    Hx PAF, PVC's   CHF (congestive heart failure) (HCC)    Coronary artery disease    04/04/2017 STEMI   Diabetes [facility])    H/O mitral valve replacement 09/21/2021   Heart attack (HCC)    per pt reprot   Hyperlipidemia    Hypertension    New onset atrial fibrillation (HCC) 05/2017    Current Outpatient Medications  Medication Sig Dispense Refill   acetaminophen (TYLENOL) 325 MG tablet Take 2 tablets (650 mg total) by mouth every 6 (six) hours as needed for mild pain or headache.     aspirin EC 81 MG tablet Take 81 mg by mouth daily.     atorvastatin (LIPITOR) 40 MG tablet TAKE 1 TABLET (40 MG TOTAL) BY MOUTH DAILY AT 6 PM. 90 tablet 3   carvedilol (COREG) 3.125 MG tablet TAKE 1 TABLET BY MOUTH 2 TIMES DAILY. 180 tablet 2   dapagliflozin propanediol (FARXIGA) 10 MG TABS tablet Take 10 mg by mouth daily.     furosemide (LASIX) 80 MG tablet Take 1 tablet (80 mg total) by mouth  daily. 90 tablet 1   glipiZIDE (GLUCOTROL XL) 10 MG 24 hr tablet Take 1 tablet (10 mg total) by mouth daily with breakfast.     JANUVIA 100 MG tablet Take 100 mg by mouth daily. Patient has not started yet     magnesium oxide (MAG-OX) 400 MG tablet TAKE 1 TABLET BY MOUTH EVERY DAY 90 tablet 3   metFORMIN (GLUCOPHAGE) 1000 MG tablet Take 1,000 mg by mouth 2 (two) times daily with a meal.     warfarin (COUMADIN) 5 MG tablet Take 1 tablet (5 mg total) by mouth daily. 45 tablet 3   No current facility-administered medications for this encounter.    Allergies   Allergen Reactions   Keflex [Cephalexin] Rash    Skin on feet peeled.   Asa [Aspirin]     325 mg ----heartburn Pt can take 81 mg   Lisinopril Cough      Social History   Socioeconomic History   Marital status: Married    Spouse name: Latesia Krishnamoorthy   Number of children: 1   Years of education: Not on file   Highest education level: Bachelor's degree (e.g., BA, AB, BS)  Occupational History   Occupation: Risk manager    Comment: Full time  Tobacco Use   Smoking status: Never   Smokeless tobacco: Never  Vaping Use   Vaping Use: Never used  Substance and Sexual Activity   Alcohol use: No   Drug use: Yes    Types: Marijuana    Comment: In her 27 's   Sexual activity: Not on file  Other Topics Concern   Not on file  Social History Narrative   Not on file   Social Determinants of Health   Financial Resource Strain: Not on file  Food Insecurity: No Food Insecurity (04/06/2022)   Hunger Vital Sign    Worried About Running Out of Food in the Last Year: Never true    Ran Out of Food in the Last Year: Never true  Transportation Needs: No Transportation Needs (04/06/2022)   PRAPARE - Hydrologist (Medical): No    Lack of Transportation (Non-Medical): No  Physical Activity: Not on file  Stress: Not on file  Social Connections: Not on file  Intimate Partner Violence: Not on file      Family History  Problem Relation Age of Onset   Heart attack Father 63   Breast cancer Maternal Grandmother     Vitals:   05/11/22 1501  BP: 136/84  Pulse: 86  SpO2: 100%  Weight: 81.4 kg (179 lb 8 oz)    PHYSICAL EXAM: General:  Well appearing. No resp difficulty HEENT: normal Neck: supple. no JVD. Carotids 2+ bilat; no bruits. No lymphadenopathy or thryomegaly appreciated. Cor: PMI nondisplaced. Regular rate & rhythm. No rubs, gallops or murmurs. Lungs: clear Abdomen: soft, nontender, nondistended. No hepatosplenomegaly. No bruits or masses.  Good bowel sounds. Extremities: no cyanosis, clubbing, rash, edema Neuro: alert & orientedx3, cranial nerves grossly intact. moves all 4 extremities w/o difficulty. Affect pleasant     ASSESSMENT & PLAN: Chronic Systolic Heart Failure/ICM - Echo 02/06/2018 LVEF 35-40% - Echo 06/20 EF 35-40% - Echo 01/22: EF 30-35% - Echo 01/23 EF 25-30% (appears closer to 20% on review with Dr. Haroldine Laws) - Sustained VT on zio 04/23. Initially wanted to think about ICD, now willing to consider. Wearing LifeVest, has f/u with EP  - R/LHC 04/20/22: LIMA to LAD and SVG to OM patent,  occluded SVG to RCA and SVG to Ramus, normal LVEDP, moderate mixed PAH, severe mitral stenosis (felt to be overestimated). Med management of CAD recommended, if has refractory angina could consider CTO RCA. - Unable to tolerate MRI d/t claustrophobia. Reviewed PYP scan from 05/23 with Dr. Haroldine Laws, appears negative. - NYHA II currently. Volume looks okay on exam but weight up 5 lb from last visit. ReDS 37%. Take furosemide 80 mg q am/40 mg q pm X 2 days then reduce to 80 mg daily. - On coreg 3.125 mg BID. Will not increase further d/t baseline conduction system disease.  - Continue Farxiga  - Off Entresto and spiro d/t hyperkalemia.  - Add bidil 1/2 tab TID - Had long discussion regarding limiting sodium intake. Eats out quite a bit. - BMET/BNP today   2. CAD S/P CABG x4: - LHC 04/20/22: LIMA to LAD and SVG to OM patent, occluded SVG to RCA and SVG to Ramus - Managing CAD medically as above. - No recent angina - Continue statin and ASA.  3. Bioprosthetic MVR:  - Echo 01/23: EF 25-30%, trivial MR, mild mitral stenosis with mean gradient 7 mmHg  - RHC 05/23: MV gradient on LV wedge tracing - mean 13, MVA 0.93 cm2, suggestive of severe MS (likely overestimated when compared to echo) - Will need to follow - Continue warfarin and ASA. Denies bleeding. - Reminded about SBE prophylaxis   4. VT/PVCs:  - 7 day Zio, 04/23: 2.3%  PVCs, sustained VT (longest 27 minutes) - Wearing LifeVest. No shocks or syncope reported. Recommended she send transmission, no data loaded since 05/22. - Sees EP later this month to discuss ICD  5. PAF - Regular on exam today - Continue warfarin. Denies bleeding  6. DM2 - Hgb A1c 6.7 in 05/23  7. Goiter - Thyroid nodule biopsied [id number], benign follicular nodule - TSH normal 05/23 - Overdue for follow-up with Dr. Renne Crigler.     Follow up Dr. Caryl Comes 06/27 and Dr. Haroldine Laws 07/12

## 2022-05-11 NOTE — Patient Instructions (Addendum)
START Bidil 1/2 tab three times daily INCREASE Lasix to 80 mg in the AM and 40 mg in the PM for 2 days, then resume normal dose of 80 mg daily thereafter  Labs today We will only contact you if something comes back abnormal or we need to make some changes. Otherwise no news is good news!  Keep follow up as scheduled with Dr Gala Romney  Do the following things EVERYDAY: Weigh yourself in the morning before breakfast. Write it down and keep it in a log. Take your medicines as prescribed Eat low salt foods--Limit salt (sodium) to 2000 mg per day.  Stay as active as you can everyday Limit all fluids for the day to less than 2 liters  At the Advanced Heart Failure Clinic, you and your health needs are our priority. As part of our continuing mission to provide you with exceptional heart care, we have created designated Provider Care Teams. These Care Teams include your primary Cardiologist (physician) and Advanced Practice Providers (APPs- Physician Assistants and Nurse Practitioners) who all work together to provide you with the care you need, when you need it.   You may see any of the following providers on your designated Care Team at your next follow up: Dr Arvilla Meres Dr Carron Curie, NP Robbie Lis, [place] [facility] [place], [place] Karle Plumber, PharmD   Please be sure to bring in all your medications bottles to every appointment.   If you have any questions or concerns before your next appointment please send [place] a message through [place] or call our office at [phone].    TO LEAVE A MESSAGE FOR THE NURSE SELECT OPTION 2, PLEASE LEAVE A MESSAGE INCLUDING: YOUR NAME DATE OF BIRTH CALL BACK NUMBER REASON FOR CALL**this is important as we prioritize the call backs  YOU WILL RECEIVE A CALL BACK THE SAME DAY AS LONG AS YOU CALL BEFORE 4:00 PM

## 2022-05-11 NOTE — Patient Instructions (Signed)
Description   Continue taking Warfarin 1 tablet (5mg) daily.  Recheck INR in 4 weeks.  Coumadin Clinic  [phone]       

## 2022-05-16 ENCOUNTER — Telehealth: Payer: Self-pay | Admitting: Internal Medicine

## 2022-05-16 ENCOUNTER — Telehealth (HOSPITAL_COMMUNITY): Payer: Self-pay | Admitting: Physician Assistant

## 2022-05-16 NOTE — Addendum Note (Signed)
Encounter addended by: Joette Catching, PA-C on: 05/16/2022 1:45 PM  Actions taken: Clinical Note Signed

## 2022-05-16 NOTE — Telephone Encounter (Signed)
Patient returning call for stress test results. She says it is okay to leave a detailed vm with the results, because she is at work and may not be able to answer her phone.

## 2022-05-16 NOTE — Telephone Encounter (Signed)
Left a voicemail for patient regarding her PYP scan. It was interpreted as equivocal. I looked at study with Dr. Haroldine Laws, appears negative.   Results provided in voicemail and gave instruction for her to call clinic with any questions.  Follow-up with Dr. Haroldine Laws as planned in July.

## 2022-05-17 NOTE — Telephone Encounter (Signed)
Spoke with pt and advised per Dr Graciela Husbands Amyloid scan was non-diagnostic.  Pt is scheduled for follow up with Dr Graciela Husbands on 05/30/2022 and plans to keep this appointment.  She thanked Charity fundraiser for the call.

## 2022-05-19 ENCOUNTER — Other Ambulatory Visit (HOSPITAL_COMMUNITY): Payer: Self-pay | Admitting: *Deleted

## 2022-05-19 MED ORDER — ISOSORBIDE MONONITRATE ER 30 MG PO TB24: 15.0000 mg | ORAL_TABLET | Freq: Every day | ORAL | 3 refills | Status: DC

## 2022-05-19 MED ORDER — HYDRALAZINE HCL 25 MG PO TABS: 12.5000 mg | ORAL_TABLET | Freq: Three times a day (TID) | ORAL | 3 refills | Status: DC

## 2022-05-30 ENCOUNTER — Ambulatory Visit (INDEPENDENT_AMBULATORY_CARE_PROVIDER_SITE_OTHER): Payer: No Typology Code available for payment source | Admitting: Internal Medicine

## 2022-05-30 ENCOUNTER — Encounter: Payer: Self-pay | Admitting: Internal Medicine

## 2022-05-30 VITALS — BP 128/70 | HR 101 | Ht 63.0 in | Wt 178.0 lb

## 2022-05-30 DIAGNOSIS — I5022 Chronic systolic (congestive) heart failure: Secondary | ICD-10-CM

## 2022-05-30 DIAGNOSIS — I255 Ischemic cardiomyopathy: Secondary | ICD-10-CM

## 2022-05-30 DIAGNOSIS — R002 Palpitations: Secondary | ICD-10-CM | POA: Diagnosis not present

## 2022-05-30 DIAGNOSIS — I480 Paroxysmal atrial fibrillation: Secondary | ICD-10-CM | POA: Diagnosis not present

## 2022-05-30 DIAGNOSIS — Z01812 Encounter for preprocedural laboratory examination: Secondary | ICD-10-CM

## 2022-05-30 MED ORDER — CARVEDILOL 3.125 MG PO TABS: 6.2500 mg | ORAL_TABLET | Freq: Two times a day (BID) | ORAL | 3 refills | Status: DC

## 2022-05-30 NOTE — Progress Notes (Signed)
Patient Care Team: Deatra James, MD as PCP - General (Family Medicine) Chilton Si, MD as PCP - Cardiology (Cardiology)   HPI  Rachael Davis is a 66 y.o. female Seen in follow-up for an consideration of an ICD for primary prevention in the setting of ischemic heart disease and a mitral valve bioprosthesis.  When she was seen however she was noted to have significant PVCs and worsening symptoms since discontinuation of Entresto   Interval hospitalization for heart failure 5/23\\  Sustained VT noted on the monitor 4/23 deferred decision as wanted to discuss with family.  Not been able to tolerate Entresto or Spyro because of hyperkalemia Lokelma was cost prohibitive\\  Short of breath about 100 yds, no nocturnal dyspnea; chronic two-pillow orthopnea.  Peripheral edema.  No interval VT of which she is aware    DATE TEST EF    5/18 LHC   3v CAD  3/19 Echo   35-40 %    1/23 Echo   25-30 %    3/23 Myoview  17% Fixed defect  5/23 PYP  Non diagnostic  5/23 LHC  LIMA-LADp;  SVG-RCA; SVG-Ramus occluded    Date Cr K Hgb           2/23 1.4 5.5    6/23 1.33 4.1 11.2    Records and Results Reviewed   Past Medical History:  Diagnosis Date   Arrhythmia    Hx PAF, PVC's   CHF (congestive heart failure) (HCC)    Coronary artery disease    04/04/2017 STEMI   Diabetes [facility])    H/O mitral valve replacement 09/21/2021   Heart attack (HCC)    per pt reprot   Hyperlipidemia    Hypertension    New onset atrial fibrillation (HCC) 05/2017    Past Surgical History:  Procedure Laterality Date   CORONARY ARTERY BYPASS GRAFT N/A 04/12/2017   Procedure: CORONARY ARTERY BYPASS GRAFTING (CABG), ON PUMP, TIMES FOUR, USING LEFT INTERNAL MAMMARY ARTERY AND ENDOSCOPICALLY HARVESTED RIGHT GREATER SAPHENOUS VEIN;  Surgeon: Kerin Perna, MD;  Location: [facility] OR;  Service: Open Heart Surgery;  Laterality: N/A;  LIMA to LAD; SVG to OM; SVG to Ramus; SVG to Right   IR THORACENTESIS ASP  PLEURAL SPACE W/IMG GUIDE  05/25/2017   LEFT HEART CATH AND CORONARY ANGIOGRAPHY N/A 04/04/2017   Procedure: Left Heart Cath and Coronary Angiography;  Surgeon: Peter M [name], MD;  Location: [facility] INVASIVE CV LAB;  Service: Cardiovascular;  Laterality: N/A;   MITRAL VALVE REPAIR N/A 04/12/2017   Procedure: MITRAL VALVE REPLACEMENT (MVR);  Surgeon: Donata Clay, Theron Arista, MD;  Location: [facility] OR;  Service: Open Heart Surgery;  Laterality: N/A;  Using a 25mm Magna Ease Mitral Pericardial Bioprosthesis   RIGHT/LEFT HEART CATH AND CORONARY/GRAFT ANGIOGRAPHY N/A 04/20/2022   Procedure: RIGHT/LEFT HEART CATH AND CORONARY/GRAFT ANGIOGRAPHY;  Surgeon: Dolores Patty, MD;  Location: [facility] INVASIVE CV LAB;  Service: Cardiovascular;  Laterality: N/A;   TEE WITHOUT CARDIOVERSION N/A 04/12/2017   Procedure: TRANSESOPHAGEAL ECHOCARDIOGRAM (TEE);  Surgeon: Donata Clay, Theron Arista, MD;  Location: [facility] OR;  Service: Open Heart Surgery;  Laterality: N/A;   TUBAL LIGATION      Current Meds  Medication Sig   acetaminophen (TYLENOL) 325 MG tablet Take 2 tablets (650 mg total) by mouth every 6 (six) hours as needed for mild pain or headache.   aspirin EC 81 MG tablet Take 81 mg by mouth daily.   atorvastatin (LIPITOR) 40 MG tablet TAKE  1 TABLET (40 MG TOTAL) BY MOUTH DAILY AT 6 PM.   carvedilol (COREG) 3.125 MG tablet TAKE 1 TABLET BY MOUTH 2 TIMES DAILY.   dapagliflozin propanediol (FARXIGA) 10 MG TABS tablet Take 10 mg by mouth daily.   furosemide (LASIX) 80 MG tablet Take 1 tablet (80 mg total) by mouth daily.   glipiZIDE (GLUCOTROL XL) 10 MG 24 hr tablet Take 1 tablet (10 mg total) by mouth daily with breakfast.   hydrALAZINE (APRESOLINE) 25 MG tablet Take 0.5 tablets (12.5 mg total) by mouth 3 (three) times daily.   isosorbide mononitrate (IMDUR) 30 MG 24 hr tablet Take 0.5 tablets (15 mg total) by mouth daily.   JANUVIA 100 MG tablet Take 100 mg by mouth daily. Patient has not started yet   magnesium oxide (MAG-OX) 400 MG tablet  TAKE 1 TABLET BY MOUTH EVERY DAY   metFORMIN (GLUCOPHAGE) 1000 MG tablet Take 1,000 mg by mouth 2 (two) times daily with a meal.   warfarin (COUMADIN) 5 MG tablet Take 1 tablet (5 mg total) by mouth daily.    Allergies  Allergen Reactions   Keflex [Cephalexin] Rash    Skin on feet peeled.   Asa [Aspirin]     325 mg ----heartburn Pt can take 81 mg   Lisinopril Cough      Review of Systems negative except from HPI and PMH  Physical Exam BP 128/70   Pulse (!) 101   Ht 5\' 3"  (1.6 m)   Wt 178 lb (80.7 kg)   SpO2 99%   BMI 31.53 kg/m  Well developed and well nourished in no acute distress HENT normal E scleral and icterus clear Neck Supple JVP flat; carotids brisk and full Clear 8-10ausculation regular rate and rhythm, no murmurs gallops or rub Soft with active bowel sounds No clubbing cyanosis 2+ Edema Alert and oriented, grossly normal motor and sensory function Skin Warm and Dry  ECG sinus at 101 Intervals 36/13/38 RBBB  Estimated Creatinine Clearance: 41.3 mL/min (A) (by C-G formula based on SCr of 1.33 mg/dL (H)).   Assessment and  Plan  Ischemic cardiomyopathy  Ventricular tachycardia-sustained   Congestive heart failure-chronic-systolic class IIb-IIIa  Right bundle branch block   Abnormal ECG with progressive conduction system disease   Atrial fibrillation-remote (last note that I can see is 6/18 shortly after her surgery)   Hypertension   Hyperkalemia precluding ACE/ARB/ASNI/MRA   PVCs  BP well controlled  On farxiga, carvedilol 3.125 and hydralazine nitrates 12.5/15  HR fast and with significant first-degree AV block the P wave is almost on top of the preceding QRS currently in the ST segment.  Would be inclined to try to increase her beta-blocker little bit; I think her right bundle branch block is not so significant that should be precluded.  She certainly does have first-degree AV block which is notable.  Now with ventricular tachycardia  sustained, she is appropriately implanted for secondary prevention for sudden death.  Would use a transvenous device given the opportunities for antitachycardia pacing.  I reviewed the risks and benefits with her including but not limited to death perforation infection lead dislodgment and inappropriate therapies.  In a shared decision making, she is elected to proceed.  I reached out to Dr. Dorthea Cove for treatment ideas reviewed related to her tachycardia>> will increase carvedilol 3.125>>6.25 bid   Current medicines are reviewed at length with the patient today .  The patient does not  have concerns regarding medicines.

## 2022-06-07 ENCOUNTER — Other Ambulatory Visit: Payer: Self-pay | Admitting: Cardiovascular Disease

## 2022-06-07 DIAGNOSIS — Z7901 Long term (current) use of anticoagulants: Secondary | ICD-10-CM

## 2022-06-07 NOTE — Telephone Encounter (Signed)
Please review for refill. Thank you! 

## 2022-06-08 ENCOUNTER — Ambulatory Visit (INDEPENDENT_AMBULATORY_CARE_PROVIDER_SITE_OTHER): Payer: No Typology Code available for payment source

## 2022-06-08 DIAGNOSIS — I480 Paroxysmal atrial fibrillation: Secondary | ICD-10-CM

## 2022-06-08 DIAGNOSIS — Z5181 Encounter for therapeutic drug level monitoring: Secondary | ICD-10-CM | POA: Diagnosis not present

## 2022-06-08 DIAGNOSIS — Z7901 Long term (current) use of anticoagulants: Secondary | ICD-10-CM

## 2022-06-08 DIAGNOSIS — Z951 Presence of aortocoronary bypass graft: Secondary | ICD-10-CM

## 2022-06-08 LAB — POCT INR: INR: 1.5 — AB (ref 2.0–3.0)

## 2022-06-08 NOTE — Patient Instructions (Signed)
TAKE 2 TABLETS TODAY ONLY and then Continue taking Warfarin 1 tablet (5mg ) daily. Recheck INR in 2 weeks. Coumadin Clinic  [phone]

## 2022-06-14 ENCOUNTER — Ambulatory Visit (HOSPITAL_COMMUNITY)
Admission: RE | Admit: 2022-06-14 | Discharge: 2022-06-14 | Disposition: A | Discharge status: Home / Self Care | Payer: No Typology Code available for payment source | Source: Ambulatory Visit | Attending: Internal Medicine | Admitting: Internal Medicine

## 2022-06-14 ENCOUNTER — Encounter (HOSPITAL_COMMUNITY): Payer: Self-pay | Admitting: Internal Medicine

## 2022-06-14 VITALS — BP 158/88 | HR 89 | Wt 173.6 lb

## 2022-06-14 DIAGNOSIS — Z7901 Long term (current) use of anticoagulants: Secondary | ICD-10-CM | POA: Diagnosis not present

## 2022-06-14 DIAGNOSIS — E875 Hyperkalemia: Secondary | ICD-10-CM | POA: Insufficient documentation

## 2022-06-14 DIAGNOSIS — Z7984 Long term (current) use of oral hypoglycemic drugs: Secondary | ICD-10-CM | POA: Insufficient documentation

## 2022-06-14 DIAGNOSIS — I2510 Atherosclerotic heart disease of native coronary artery without angina pectoris: Secondary | ICD-10-CM | POA: Diagnosis not present

## 2022-06-14 DIAGNOSIS — I25810 Atherosclerosis of coronary artery bypass graft(s) without angina pectoris: Secondary | ICD-10-CM | POA: Insufficient documentation

## 2022-06-14 DIAGNOSIS — R0609 Other forms of dyspnea: Secondary | ICD-10-CM | POA: Insufficient documentation

## 2022-06-14 DIAGNOSIS — Z953 Presence of xenogenic heart valve: Secondary | ICD-10-CM | POA: Diagnosis not present

## 2022-06-14 DIAGNOSIS — Z955 Presence of coronary angioplasty implant and graft: Secondary | ICD-10-CM | POA: Diagnosis not present

## 2022-06-14 DIAGNOSIS — E041 Nontoxic single thyroid nodule: Secondary | ICD-10-CM | POA: Diagnosis not present

## 2022-06-14 DIAGNOSIS — Z79899 Other long term (current) drug therapy: Secondary | ICD-10-CM | POA: Diagnosis not present

## 2022-06-14 DIAGNOSIS — I472 Ventricular tachycardia, unspecified: Secondary | ICD-10-CM | POA: Diagnosis not present

## 2022-06-14 DIAGNOSIS — I252 Old myocardial infarction: Secondary | ICD-10-CM | POA: Diagnosis not present

## 2022-06-14 DIAGNOSIS — R609 Edema, unspecified: Secondary | ICD-10-CM | POA: Diagnosis not present

## 2022-06-14 DIAGNOSIS — I5022 Chronic systolic (congestive) heart failure: Secondary | ICD-10-CM | POA: Diagnosis not present

## 2022-06-14 DIAGNOSIS — I110 Hypertensive heart disease with heart failure: Secondary | ICD-10-CM | POA: Insufficient documentation

## 2022-06-14 DIAGNOSIS — I441 Atrioventricular block, second degree: Secondary | ICD-10-CM | POA: Diagnosis not present

## 2022-06-14 DIAGNOSIS — I052 Rheumatic mitral stenosis with insufficiency: Secondary | ICD-10-CM | POA: Insufficient documentation

## 2022-06-14 DIAGNOSIS — E119 Type 2 diabetes mellitus without complications: Secondary | ICD-10-CM | POA: Diagnosis not present

## 2022-06-14 DIAGNOSIS — Z952 Presence of prosthetic heart valve: Secondary | ICD-10-CM | POA: Diagnosis not present

## 2022-06-14 DIAGNOSIS — Z951 Presence of aortocoronary bypass graft: Secondary | ICD-10-CM

## 2022-06-14 DIAGNOSIS — I480 Paroxysmal atrial fibrillation: Secondary | ICD-10-CM | POA: Diagnosis not present

## 2022-06-14 LAB — BASIC METABOLIC PANEL
Anion gap: 10 (ref 5–15)
BUN: 28 mg/dL — ABNORMAL HIGH (ref 8–23)
CO2: 24 mmol/L (ref 22–32)
Calcium: 9.7 mg/dL (ref 8.9–10.3)
Chloride: 105 mmol/L (ref 98–111)
Creatinine, Ser: 1.58 mg/dL — ABNORMAL HIGH (ref 0.44–1.00)
GFR, Estimated: 36 mL/min — ABNORMAL LOW (ref >60)
Glucose, Bld: 87 mg/dL (ref 70–99)
Potassium: 4.2 mmol/L (ref 3.5–5.1)
Sodium: 139 mmol/L (ref 135–145)

## 2022-06-14 LAB — BRAIN NATRIURETIC PEPTIDE: B Natriuretic Peptide: 613.3 pg/mL — ABNORMAL HIGH (ref 0.0–100.0)

## 2022-06-14 MED ORDER — ENTRESTO 24-26 MG PO TABS: 1.0000 | ORAL_TABLET | Freq: Two times a day (BID) | ORAL | 3 refills | Status: DC

## 2022-06-14 NOTE — Patient Instructions (Signed)
Medication Changes:  Restart Entresto 24/26 mg Twice daily   Lab Work:  Labs done today, we will call you for abnormal results  Your physician recommends that you return for lab work in: 2 weeks and again in 4 weeks  Testing/Procedures:  none  Referrals:  none  Special Instructions // Education:  Do the following things EVERYDAY: Weigh yourself in the morning before breakfast. Write it down and keep it in a log. Take your medicines as prescribed Eat low salt foods--Limit salt (sodium) to 2000 mg per day.  Stay as active as you can everyday Limit all fluids for the day to less than 2 liters  Follow-Up in: 3 months  At the [facility], you and your health needs are our priority. We have a designated team specialized in the treatment of Heart Failure. This Care Team includes your primary Heart Failure Specialized Cardiologist (physician), Advanced Practice Providers (APPs- Physician Assistants and Nurse Practitioners), and Pharmacist who all work together to provide you with the care you need, when you need it.   You may see any of the following providers on your designated Care Team at your next follow up:  Dr Arvilla Meres Dr Carron Curie, NP Robbie Lis, [place] [facility] [place], [place] Karle Plumber, PharmD   Please be sure to bring in all your medications bottles to every appointment.   Need to Contact us:  If you have any questions or concerns before your next appointment please send [place] a message through [place] or call our office at [phone].    TO LEAVE A MESSAGE FOR THE NURSE SELECT OPTION 2, PLEASE LEAVE A MESSAGE INCLUDING: YOUR NAME DATE OF BIRTH CALL BACK NUMBER REASON FOR CALL**this is important as we prioritize the call backs  YOU WILL RECEIVE A CALL BACK THE SAME DAY AS LONG AS YOU CALL BEFORE 4:00 PM

## 2022-06-14 NOTE — Progress Notes (Addendum)
ADVANCED HEART FAILURE CLINIC NOTE     Primary Care: Dr. Nancy Fetter Primary Cardiologist: Dr. Oval Linsey HF: Dr. Haroldine Laws  HPI:  66 y.o. female with history of CAD s/p CABG 99991111, chronic systolic CHF/iCM, hx bioprosthetic MV replacement at time of CABG, PVCs, DM, remote AF (shortly after CABG 2018).   She was previously followed by Dr. Haroldine Laws in [place] Failure clinic, graduated from clinic in 05/2019. Echo 05/2019: EF 35-40%. Has been followed by Dr. Oval Linsey with Cardiology.  Echo 01/22: EF 30-35%  Echo 01/23: EF 25-30%, RV moderately reduced, RVSP 38.8 mmHg, trivial MR, mean gradient 7 mmHg across mitral valve prosthesis, IVC dilated with estimated RAP 15 mmHg  Saw EP in 03/23. D/t presence of progressive conduction system disease workup for amyloidosis recommended. Unable to tolerate cMRI d/t claustrophobia.   7 day Zio 04/23: SR with avg rate 83 bpm, 1st degree AVB and intermittent Wenckebach AV block noted. 2.3% PVCs. 6 runs VT, longest 27 min and 48 seconds. ICD recommended. She wanted to discuss with family before proceeding.  Admitted 05/23 with a/c CHF. Diuresed with IV lasix. Discharged on furosemide 80 mg daily. GDMT - coreg 3.125 mg BID and dapagliflozin 10 mg daily.   Seen in [facility] clinic 04/17/22. Added back Entresto, later stopped when she developed hyperkalemia. Felt better when she was on Entresto in the past. LifeVest placed dt/ episode sustained VT on monitor in April.  PYP scan 04/20/22 equivocal for TTR amyloidosis.  R/LHC 04/20/22: LIMA to LAD and SVG to OM patent, occluded SVG to RCA and SVG to Ramus, normal LVEDP, moderate mixed PAH, severe mitral stenosis (felt to be overestimated). Med management of CAD recommended, if has refractory angina could consider CTO RCA.  She is here today for follow-up. Recently saw Dr. Caryl Comes. Carvedilol increased to 6.25 bid. Scheduled for ICD implant 06/30/22. Wearing Lifevest. No firings. Denies CP. Occasional edema and  takes extra lasix as needed for ankle swelling. Able to do all ADLs. Mild DOE. 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   Review of Systems: Cardiac and respiratory. Negative except as mentioned in HPI.   Past Medical History:  Diagnosis Date   Arrhythmia    Hx PAF, PVC's   CHF (congestive heart failure) (HCC)    Coronary artery disease    04/04/2017 STEMI   Diabetes [facility])    H/O mitral valve replacement 09/21/2021   Heart attack ([place])    per pt reprot   Hyperlipidemia    Hypertension    New onset atrial fibrillation ([place]) 05/2017    Current Outpatient Medications  Medication Sig Dispense Refill   acetaminophen (TYLENOL) 325 MG tablet Take 2 tablets (650 mg total) by mouth every 6 (six) hours as needed for mild pain or headache.     aspirin EC 81 MG tablet Take 81 mg by mouth daily.     atorvastatin (LIPITOR) 40 MG tablet TAKE 1 TABLET (40 MG TOTAL) BY MOUTH DAILY AT 6 PM. 90 tablet 3   carvedilol (COREG) 3.125 MG tablet Take 2 tablets (6.25 mg total) by mouth 2 (two) times daily. 180 tablet 3   dapagliflozin propanediol (FARXIGA) 10 MG TABS tablet Take 10 mg by mouth daily.     furosemide (LASIX) 80 MG tablet Take 1 tablet (80 mg total) by mouth daily. 90 tablet 1   glipiZIDE (GLUCOTROL XL) 10 MG 24 hr tablet Take 1 tablet (10 mg total) by mouth daily with breakfast.  hydrALAZINE (APRESOLINE) 25 MG tablet Take 0.5 tablets (12.5 mg total) by mouth 3 (three) times daily. 45 tablet 3   isosorbide mononitrate (IMDUR) 30 MG 24 hr tablet Take 0.5 tablets (15 mg total) by mouth daily. 45 tablet 3   JANUVIA 100 MG tablet Take 100 mg by mouth daily. Patient has not started yet     magnesium oxide (MAG-OX) 400 MG tablet TAKE 1 TABLET BY MOUTH EVERY DAY 90 tablet 3   metFORMIN (GLUCOPHAGE) 1000 MG tablet Take 1,000 mg by mouth 2 (two) times daily with a meal.     warfarin (JANTOVEN) 5 MG tablet TAKE 1 TO 1 AND 1/2 TABLETS BY MOUTH DAILY AS DIRECTED BY THE  COUMADIN CLIN 100 tablet 1   No current facility-administered medications for this encounter.    Allergies  Allergen Reactions   Keflex [Cephalexin] Rash    Skin on feet peeled.   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  Tobacco Use   Smoking status: Never   Smokeless tobacco: Never  Vaping Use   Vaping Use: Never used  Substance and Sexual Activity   Alcohol use: No   Drug use: Yes    Types: Marijuana    Comment: In her 45 's   Sexual activity: Not on file  Other Topics Concern   Not on file  Social History Narrative   Not on file   Social Determinants of Health   Financial Resource Strain: Not on file  Food Insecurity: No Food Insecurity (04/06/2022)   Hunger Vital Sign    Worried About Running Out of Food in the Last Year: Never true    Ran Out of Food in the Last Year: Never true  Transportation Needs: No Transportation Needs (04/06/2022)   PRAPARE - Administrator, Civil Service (Medical): No    Lack of Transportation (Non-Medical): No  Physical Activity: Not on file  Stress: Not on file  Social Connections: Not on file  Intimate Partner Violence: Not on file      Family History  Problem Relation Age of Onset   Heart attack Father 61   Breast cancer Maternal Grandmother     Vitals:   06/14/22 1448  BP: (!) 158/88  Pulse: 89  SpO2: 100%  Weight: 78.7 kg (173 lb 9.6 oz)    PHYSICAL EXAM: General:  Well appearing. No resp difficulty HEENT: normal Neck: supple. no JVD. Carotids 2+ bilat; no bruits. No lymphadenopathy + goiter Cor: PMI nondisplaced. Regular rate & rhythm. No rubs, gallops or murmurs.  Wearing Lifevest Lungs: clear Abdomen: obese soft, nontender, nondistended. No hepatosplenomegaly. No bruits or masses. Good bowel sounds. Extremities: no cyanosis, clubbing, rash, tr edema Neuro: alert & orientedx3, cranial nerves grossly intact. moves all 4 extremities w/o difficulty. Affect pleasant   ASSESSMENT & PLAN:  Chronic Systolic Heart Failure/ICM - Echo 02/06/2018 LVEF 35-40% - Echo 06/20 EF 35-40% - Echo 01/22: EF 30-35% - Echo 01/23 EF 25-30% (appears closer to 20% on review with Dr. Gala Romney) - Sustained VT on zio 04/23. Initially wanted to  think about ICD, now willing to consider. Wearing LifeVest, has f/u with EP  - R/LHC 04/20/22: LIMA to LAD and SVG to OM patent, occluded SVG to RCA and SVG to Ramus, normal LVEDP, moderate mixed PAH, severe mitral stenosis (felt to be overestimated). Med management of CAD recommended, if has refractory angina could consider CTO RCA. - Unable to tolerate MRI d/t claustrophobia.PYP scan from 05/23  negative. - NYHA II currently. Volume looks okay. Takes extra lasix as needed - On coreg 6.25 mg BID. Follow carefully with baseline conduction system disease.  - Continue Farxiga  - Off Entresto and spiro d/t hyperkalemia.  - Recent K 4.1. Will restart Entresto 24/26. Follow BMET closely.  - Continue bidil 1/2 tab TID - Has seen Dr. Graciela Husbands Pending ICD    2. CAD S/P CABG x4: - LHC 04/20/22: LIMA to LAD and SVG to OM patent, occluded SVG to RCA and SVG to Ramus - Managing CAD medically as above. - No recent angina - Continue statin and ASA.  3. Bioprosthetic MVR:  - Echo 01/23: EF 25-30%, trivial MR, mild mitral stenosis with mean gradient 7 mmHg  - RHC 05/23: MV gradient on LV wedge tracing - mean 13, MVA 0.93 cm2, suggestive of severe MS (likely overestimated when compared to echo) - Will need to follow - Continue warfarin and ASA. Denies bleeding. - Reminded about SBE prophylaxis   4. VT/PVCs:  - 7 day Zio, 04/23: 2.3% PVCs,  sustained VT (longest 27 minutes) - Wearing LifeVest. No shocks or syncope reported. Recommended she send transmission, no data loaded since 05/22. - Scheduled for ICD implant 06/30/22  5. PAF - In NSR - Continue warfarin. Denies bleeding  6. DM2 - Hgb A1c 6.7 in 05/23  7. Goiter - Thyroid nodule biopsied 10/20, benign follicular nodule - TSH normal 05/23 - Overdue for follow-up with Dr. Lafe Garin.    Arvilla Meres, MD  3:14 PM

## 2022-06-14 NOTE — Addendum Note (Signed)
Encounter addended by: Noralee Space, RN on: 06/14/2022 3:42 PM  Actions taken: Order list changed, Diagnosis association updated, Clinical Note Signed, Charge Capture section accepted

## 2022-06-22 ENCOUNTER — Telehealth: Payer: Self-pay | Admitting: Internal Medicine

## 2022-06-22 NOTE — Telephone Encounter (Signed)
Pt states that she has a scheduled procedure on next Friday but has recently caught a cold. She states that she has been taking meds for the cold but would like to make sure that this will not delay her having her procedure. Pt would like a callback from nurse. Please advise

## 2022-06-22 NOTE — Telephone Encounter (Signed)
Spoke with pt whp states she developed some "congestion" yesterday and began taking Coricidin for symptoms.  Pt denies fever or additional symptoms at this time.  Pt advised if symptoms worsen or she develops a fever to contact RN next week prior to implant date.  Pt may follow up with PCP if needed.  Pt verbalizes understanding and agrees with current plan.

## 2022-06-23 ENCOUNTER — Other Ambulatory Visit: Payer: No Typology Code available for payment source

## 2022-06-23 DIAGNOSIS — Z01812 Encounter for preprocedural laboratory examination: Secondary | ICD-10-CM

## 2022-06-23 DIAGNOSIS — I5022 Chronic systolic (congestive) heart failure: Secondary | ICD-10-CM

## 2022-06-23 DIAGNOSIS — I255 Ischemic cardiomyopathy: Secondary | ICD-10-CM

## 2022-06-24 LAB — PROTIME-INR
INR: 3.3 — ABNORMAL HIGH (ref 0.9–1.2)
Prothrombin Time: 32.4 s — ABNORMAL HIGH (ref 9.1–12.0)

## 2022-06-24 LAB — BASIC METABOLIC PANEL
BUN/Creatinine Ratio: 21 (ref 12–28)
BUN: 32 mg/dL — ABNORMAL HIGH (ref 8–27)
CO2: 20 mmol/L (ref 20–29)
Calcium: 9.7 mg/dL (ref 8.7–10.3)
Chloride: 103 mmol/L (ref 96–106)
Creatinine, Ser: 1.49 mg/dL — ABNORMAL HIGH (ref 0.57–1.00)
Glucose: 63 mg/dL — ABNORMAL LOW (ref 70–99)
Potassium: 4.8 mmol/L (ref 3.5–5.2)
Sodium: 141 mmol/L (ref 134–144)
eGFR: 38 mL/min/{1.73_m2} — ABNORMAL LOW (ref >59)

## 2022-06-24 LAB — CBC
Hematocrit: 39.5 % (ref 34.0–46.6)
Hemoglobin: 12.2 g/dL (ref 11.1–15.9)
MCH: 21.4 pg — ABNORMAL LOW (ref 26.6–33.0)
MCHC: 30.9 g/dL — ABNORMAL LOW (ref 31.5–35.7)
MCV: 69 fL — ABNORMAL LOW (ref 79–97)
Platelets: 241 10*3/uL (ref 150–450)
RBC: 5.7 x10E6/uL — ABNORMAL HIGH (ref 3.77–5.28)
RDW: 19.9 % — ABNORMAL HIGH (ref 11.7–15.4)
WBC: 6.9 10*3/uL (ref 3.4–10.8)

## 2022-06-25 ENCOUNTER — Other Ambulatory Visit (HOSPITAL_COMMUNITY): Payer: Self-pay | Admitting: Physician Assistant

## 2022-06-27 ENCOUNTER — Telehealth: Payer: Self-pay | Admitting: Internal Medicine

## 2022-06-27 NOTE — Telephone Encounter (Signed)
Patient calling in bout instructions about her procedure for dr. Graciela Husbands. Please advise

## 2022-06-27 NOTE — Telephone Encounter (Signed)
Spoke with pt who is asking how she will need to hold her Coumadin for ICD implant scheduled for 06/30/2022.  Pt advised per Dr Graciela Husbands she will take 1/2 of her regular dose tonight(7/25) and tomorrow(7/26)and a full dose on Thursday(7/27).  Pt verbalizes understanding and thanked Charity fundraiser for the phone call.

## 2022-06-28 ENCOUNTER — Other Ambulatory Visit (HOSPITAL_COMMUNITY): Payer: No Typology Code available for payment source

## 2022-06-29 ENCOUNTER — Ambulatory Visit (HOSPITAL_COMMUNITY)
Admission: RE | Admit: 2022-06-29 | Discharge: 2022-06-29 | Disposition: A | Discharge status: Home / Self Care | Payer: No Typology Code available for payment source | Source: Ambulatory Visit | Attending: Internal Medicine | Admitting: Internal Medicine

## 2022-06-29 DIAGNOSIS — I5022 Chronic systolic (congestive) heart failure: Secondary | ICD-10-CM | POA: Insufficient documentation

## 2022-06-29 LAB — BASIC METABOLIC PANEL
Anion gap: 14 (ref 5–15)
BUN: 37 mg/dL — ABNORMAL HIGH (ref 8–23)
CO2: 21 mmol/L — ABNORMAL LOW (ref 22–32)
Calcium: 9.5 mg/dL (ref 8.9–10.3)
Chloride: 105 mmol/L (ref 98–111)
Creatinine, Ser: 1.61 mg/dL — ABNORMAL HIGH (ref 0.44–1.00)
GFR, Estimated: 35 mL/min — ABNORMAL LOW (ref >60)
Glucose, Bld: 83 mg/dL (ref 70–99)
Potassium: 4.8 mmol/L (ref 3.5–5.1)
Sodium: 140 mmol/L (ref 135–145)

## 2022-06-29 NOTE — Pre-Procedure Instructions (Signed)
Instructed patient on the following items: Arrival time 0530 Nothing to eat or drink after midnight No meds AM of procedure Responsible person to drive you home and stay with you for 24 hrs Wash with special soap night before and morning of procedure If on anti-coagulant drug instructions warfarin- 1/2 dose 5/25, 1/2 dose 5/26 and whole dose 5/27

## 2022-06-30 ENCOUNTER — Ambulatory Visit (HOSPITAL_COMMUNITY): Payer: No Typology Code available for payment source

## 2022-06-30 ENCOUNTER — Ambulatory Visit (HOSPITAL_COMMUNITY)
Admission: RE | Admit: 2022-06-30 | Discharge: 2022-06-30 | Disposition: A | Discharge status: Home / Self Care | Payer: No Typology Code available for payment source | Attending: Internal Medicine | Admitting: Internal Medicine

## 2022-06-30 ENCOUNTER — Other Ambulatory Visit: Payer: Self-pay

## 2022-06-30 ENCOUNTER — Ambulatory Visit (HOSPITAL_COMMUNITY)
Admission: RE | Disposition: A | Discharge status: Home / Self Care | Payer: Self-pay | Source: Home / Self Care | Attending: Internal Medicine

## 2022-06-30 DIAGNOSIS — Z951 Presence of aortocoronary bypass graft: Secondary | ICD-10-CM | POA: Insufficient documentation

## 2022-06-30 DIAGNOSIS — I472 Ventricular tachycardia, unspecified: Secondary | ICD-10-CM | POA: Diagnosis not present

## 2022-06-30 DIAGNOSIS — I4510 Unspecified right bundle-branch block: Secondary | ICD-10-CM | POA: Diagnosis not present

## 2022-06-30 DIAGNOSIS — E875 Hyperkalemia: Secondary | ICD-10-CM | POA: Diagnosis not present

## 2022-06-30 DIAGNOSIS — I5022 Chronic systolic (congestive) heart failure: Secondary | ICD-10-CM | POA: Diagnosis not present

## 2022-06-30 DIAGNOSIS — I440 Atrioventricular block, first degree: Secondary | ICD-10-CM

## 2022-06-30 DIAGNOSIS — R9431 Abnormal electrocardiogram [ECG] [EKG]: Secondary | ICD-10-CM | POA: Insufficient documentation

## 2022-06-30 DIAGNOSIS — I110 Hypertensive heart disease with heart failure: Secondary | ICD-10-CM | POA: Insufficient documentation

## 2022-06-30 DIAGNOSIS — R002 Palpitations: Secondary | ICD-10-CM

## 2022-06-30 DIAGNOSIS — I255 Ischemic cardiomyopathy: Secondary | ICD-10-CM | POA: Insufficient documentation

## 2022-06-30 DIAGNOSIS — I4891 Unspecified atrial fibrillation: Secondary | ICD-10-CM | POA: Diagnosis not present

## 2022-06-30 HISTORY — PX: ICD IMPLANT: EP1208

## 2022-06-30 LAB — GLUCOSE, CAPILLARY
Glucose-Capillary: 72 mg/dL (ref 70–99)
Glucose-Capillary: 63 mg/dL — ABNORMAL LOW (ref 70–99)
Glucose-Capillary: 93 mg/dL (ref 70–99)

## 2022-06-30 LAB — PROTIME-INR
INR: 2 — ABNORMAL HIGH (ref 0.8–1.2)
Prothrombin Time: 22.6 seconds — ABNORMAL HIGH (ref 11.4–15.2)

## 2022-06-30 SURGERY — ICD IMPLANT

## 2022-06-30 MED ORDER — VANCOMYCIN HCL IN DEXTROSE 1-5 GM/200ML-% IV SOLN
1000.0000 mg | INTRAVENOUS | Status: AC
  Administered 2022-06-30: 1000 mg via INTRAVENOUS

## 2022-06-30 MED ORDER — HEPARIN (PORCINE) IN NACL 1000-0.9 UT/500ML-% IV SOLN
INTRAVENOUS | Status: DC | PRN
  Administered 2022-06-30: 500 mL

## 2022-06-30 MED ORDER — MIDAZOLAM HCL 5 MG/5ML IJ SOLN
INTRAMUSCULAR | Status: DC | PRN
  Administered 2022-06-30 (×2): 1 mg via INTRAVENOUS

## 2022-06-30 MED ORDER — FENTANYL CITRATE (PF) 100 MCG/2ML IJ SOLN
INTRAMUSCULAR | Status: AC
  Filled 2022-06-30: qty 2

## 2022-06-30 MED ORDER — POVIDONE-IODINE 10 % EX SWAB: 2.0000 | Freq: Once | CUTANEOUS | Status: DC

## 2022-06-30 MED ORDER — LIDOCAINE HCL 1 % IJ SOLN
INTRAMUSCULAR | Status: AC
  Filled 2022-06-30: qty 60

## 2022-06-30 MED ORDER — LIDOCAINE HCL (PF) 1 % IJ SOLN
INTRAMUSCULAR | Status: DC | PRN
  Administered 2022-06-30: 60 mL

## 2022-06-30 MED ORDER — HEPARIN (PORCINE) IN NACL 1000-0.9 UT/500ML-% IV SOLN
INTRAVENOUS | Status: AC
Start: 2022-06-30 — End: ?
  Filled 2022-06-30: qty 500

## 2022-06-30 MED ORDER — SODIUM CHLORIDE 0.9 % IV SOLN
INTRAVENOUS | Status: DC
Start: 2022-06-30 — End: 2022-06-30

## 2022-06-30 MED ORDER — MIDAZOLAM HCL 5 MG/5ML IJ SOLN
INTRAMUSCULAR | Status: AC
Start: 2022-06-30 — End: ?
  Filled 2022-06-30: qty 5

## 2022-06-30 MED ORDER — ONDANSETRON HCL 4 MG/2ML IJ SOLN: 4.0000 mg | Freq: Four times a day (QID) | INTRAMUSCULAR | Status: DC | PRN

## 2022-06-30 MED ORDER — VANCOMYCIN HCL IN DEXTROSE 1-5 GM/200ML-% IV SOLN
INTRAVENOUS | Status: AC
  Filled 2022-06-30: qty 200

## 2022-06-30 MED ORDER — SODIUM CHLORIDE 0.9 % IV SOLN
INTRAVENOUS | Status: AC
  Filled 2022-06-30: qty 2

## 2022-06-30 MED ORDER — SODIUM CHLORIDE 0.9 % IV SOLN
80.0000 mg | INTRAVENOUS | Status: AC
  Administered 2022-06-30: 80 mg

## 2022-06-30 MED ORDER — ACETAMINOPHEN 325 MG PO TABS
325.0000 mg | ORAL_TABLET | ORAL | Status: DC | PRN
  Administered 2022-06-30: 650 mg via ORAL
  Filled 2022-06-30: qty 2

## 2022-06-30 MED ORDER — FENTANYL CITRATE (PF) 100 MCG/2ML IJ SOLN
INTRAMUSCULAR | Status: DC | PRN
  Administered 2022-06-30 (×3): 25 ug via INTRAVENOUS

## 2022-06-30 MED ORDER — CHLORHEXIDINE GLUCONATE 4 % EX LIQD: 4.0000 | Freq: Once | CUTANEOUS | Status: DC

## 2022-06-30 SURGICAL SUPPLY — 6 items
CABLE SURGICAL S-101-97-12 (CABLE) ×3 IMPLANT
ICD MOMENTUM D120 (ICD Generator) ×1 IMPLANT
LEAD RELIANCE 0138-64 (Lead) ×1 IMPLANT
PAD DEFIB RADIO PHYSIO CONN (PAD) ×3 IMPLANT
SHEATH 9FR PRELUDE SNAP 13 (SHEATH) ×1 IMPLANT
TRAY PACEMAKER INSERTION (PACKS) ×3 IMPLANT

## 2022-06-30 NOTE — Discharge Instructions (Addendum)
After Your ICD (Implantable Cardiac Defibrillator)   You have a [employer] ICD  ACTIVITY Do not lift your arm above shoulder height for 1 week after your procedure. After 7 days, you may progress as below.  You should remove your sling 24 hours after your procedure, unless otherwise instructed by your provider.     Friday July 07, 2022  Saturday July 08, 2022 Sunday July 09, 2022 Monday July 10, 2022   Do not lift, push, pull, or carry anything over 10 pounds with the affected arm until 6 weeks (Friday August 11, 2022 ) after your procedure.   You may drive AFTER your wound check, unless you have been told otherwise by your provider.   Ask your healthcare provider when you can go back to work   INCISION/Dressing If you are on a blood thinner such as Coumadin,. Restart Tonight   If large square, outer bandage is left in place, this can be removed after 24 hours from your procedure. Do not remove steri-strips or glue as below. KEEP SITE DRY TILL WOUND CHECK   Monitor your defibrillator site for redness, swelling, and drainage. Call the device clinic at [phone] if you experience these symptoms or fever/chills.  If your incision is sealed with Steri-strips or staples, you may shower 7 days after your procedure or when told by your provider. Do not remove the steri-strips or let the shower hit directly on your site. You may wash around your site with soap and water.    If you were discharged in a sling, please do not wear this during the day more than 48 hours after your surgery unless otherwise instructed. This may increase the risk of stiffness and soreness in your shoulder.   Avoid lotions, ointments, or perfumes over your incision until it is well-healed.  You may use a hot tub or a pool AFTER your wound check appointment if the incision is completely closed.  Your ICD is designed to protect you from life threatening heart rhythms. Because of this, you may receive  a shock.   1 shock with no symptoms:  Call the office during business hours. 1 shock with symptoms (chest pain, chest pressure, dizziness, lightheadedness, shortness of breath, overall feeling unwell):  Call 911. If you experience 2 or more shocks in 24 hours:  Call 911. If you receive a shock, you should not drive for 6 months per the [place] DMV IF you receive appropriate therapy from your ICD.   ICD Alerts:  Some alerts are vibratory and others beep. These are NOT emergencies. Please call our office to let us know. If this occurs at night or on weekends, it can wait until the next business day. Send a remote transmission.  If your device is capable of reading fluid status (for heart failure), you will be offered monthly monitoring to review this with you.   DEVICE MANAGEMENT Remote monitoring is used to monitor your ICD from home. This monitoring is scheduled every 91 days by our office. It allows [place] to keep an eye on the functioning of your device to ensure it is working properly. You will routinely see your Electrophysiologist annually (more often if necessary).   You should receive your ID card for your new device in 4-8 weeks. Keep this card with you at all times once received. Consider wearing a medical alert bracelet or necklace.  Your ICD  may be MRI compatible. This will be discussed at your next office visit/wound check.  You should  avoid contact with strong electric or magnetic fields.   Do not use amateur (ham) radio equipment or electric (arc) welding torches. MP3 player headphones with magnets should not be used. Some devices are safe to use if held at least 12 inches (30 cm) from your defibrillator. These include power tools, lawn mowers, and speakers. If you are unsure if something is safe to use, ask your health care provider.  When using your cell phone, hold it to the ear that is on the opposite side from the defibrillator. Do not leave your cell phone in a pocket over the  defibrillator.  You may safely use electric blankets, heating pads, computers, and microwave ovens.  Call the office right away if: You have chest pain. You feel more than one shock. You feel more short of breath than you have felt before. You feel more light-headed than you have felt before. Your incision starts to open up.  This information is not intended to replace advice given to you by your health care provider. Make sure you discuss any questions you have with your health care provider.

## 2022-06-30 NOTE — H&P (Signed)
Patient Care Team: Deatra James, MD as PCP - General (Family Medicine) Chilton Si, MD as PCP - Cardiology (Cardiology)   HPI  Rachael Davis is a 66 y.o. female admitted for ICD for primary prevention in setting of ICM and prior CABG; sustained VT on monitor  Dyspnea better > 100 yds No PND 2 pillow orthopnea  No LifeVest shocks    DATE TEST EF    5/18 LHC   3v CAD  3/19 Echo   35-40 %    1/23 Echo   25-30 %    3/23 Myoview  17% Fixed defect  5/23 PYP   Non diagnostic  5/23 LHC   LIMA-LADp;  SVG-RCA; SVG-Ramus occluded    Date Cr K Hgb           2/23 1.4 5.5    6/23 1.33 4.1 11.2    Records and Results Reviewed   Past Medical History:  Diagnosis Date   Arrhythmia    Hx PAF, PVC's   CHF (congestive heart failure) (HCC)    Coronary artery disease    04/04/2017 STEMI   Diabetes (HCC)    H/O mitral valve replacement 09/21/2021   Heart attack (HCC)    per pt reprot   Hyperlipidemia    Hypertension    New onset atrial fibrillation (HCC) 05/2017    Past Surgical History:  Procedure Laterality Date   CORONARY ARTERY BYPASS GRAFT N/A 04/12/2017   Procedure: CORONARY ARTERY BYPASS GRAFTING (CABG), ON PUMP, TIMES FOUR, USING LEFT INTERNAL MAMMARY ARTERY AND ENDOSCOPICALLY HARVESTED RIGHT GREATER SAPHENOUS VEIN;  Surgeon: Kerin Perna, MD;  Location: [facility] OR;  Service: Open Heart Surgery;  Laterality: N/A;  LIMA to LAD; SVG to OM; SVG to Ramus; SVG to Right   IR THORACENTESIS ASP PLEURAL SPACE W/IMG GUIDE  05/25/2017   LEFT HEART CATH AND CORONARY ANGIOGRAPHY N/A 04/04/2017   Procedure: Left Heart Cath and Coronary Angiography;  Surgeon: Peter M [name], MD;  Location: [facility] INVASIVE CV LAB;  Service: Cardiovascular;  Laterality: N/A;   MITRAL VALVE REPAIR N/A 04/12/2017   Procedure: MITRAL VALVE REPLACEMENT (MVR);  Surgeon: Donata Clay, Theron Arista, MD;  Location: [facility] OR;  Service: Open Heart Surgery;  Laterality: N/A;  Using a 64mm Magna Ease Mitral Pericardial  Bioprosthesis   RIGHT/LEFT HEART CATH AND CORONARY/GRAFT ANGIOGRAPHY N/A 04/20/2022   Procedure: RIGHT/LEFT HEART CATH AND CORONARY/GRAFT ANGIOGRAPHY;  Surgeon: Dolores Patty, MD;  Location: [facility] INVASIVE CV LAB;  Service: Cardiovascular;  Laterality: N/A;   TEE WITHOUT CARDIOVERSION N/A 04/12/2017   Procedure: TRANSESOPHAGEAL ECHOCARDIOGRAM (TEE);  Surgeon: Donata Clay, Theron Arista, MD;  Location: [facility] OR;  Service: Open Heart Surgery;  Laterality: N/A;   TUBAL LIGATION      Current Facility-Administered Medications  Medication Dose Route Frequency Provider Last Rate Last Admin   0.9 %  sodium chloride infusion   Intravenous Continuous Duke Salvia, MD 50 mL/hr at 06/30/22 0549 New Bag at 06/30/22 0549   0.9 %  sodium chloride infusion   Intravenous Continuous Duke Salvia, MD 50 mL/hr at 06/30/22 0555 New Bag at 06/30/22 0555   chlorhexidine (HIBICLENS) 4 % liquid 4 Application  4 Application Topical Once Duke Salvia, MD       gentamicin (GARAMYCIN) 80 mg in sodium chloride 0.9 % 500 mL irrigation  80 mg Irrigation On Call Duke Salvia, MD       povidone-iodine 10 % swab 2 Application  2 Application Topical  Once Duke Salvia, MD       vancomycin [facility]) IVPB 1000 mg/200 mL premix  1,000 mg Intravenous On Call Duke Salvia, MD        Allergies  Allergen Reactions   Keflex [Cephalexin] Rash    Skin on feet peeled.   Asa [Aspirin]     325 mg ----heartburn Pt can take 81 mg   Lisinopril Cough      Social History   Tobacco Use   Smoking status: Never   Smokeless tobacco: Never  Vaping Use   Vaping Use: Never used  Substance Use Topics   Alcohol use: No   Drug use: Yes    Types: Marijuana    Comment: In her 18 's     Family History  Problem Relation Age of Onset   Heart attack Father 73   Breast cancer Maternal Grandmother      Current Meds  Medication Sig   aspirin EC 81 MG tablet Take 81 mg by mouth daily.   atorvastatin (LIPITOR) 40 MG tablet TAKE 1  TABLET (40 MG TOTAL) BY MOUTH DAILY AT 6 PM.   carvedilol (COREG) 3.125 MG tablet Take 2 tablets (6.25 mg total) by mouth 2 (two) times daily.   dapagliflozin propanediol (FARXIGA) 10 MG TABS tablet Take 10 mg by mouth daily.   DM-APAP-CPM (CORICIDIN HBP PO) Take 2 tablets by mouth daily as needed (congestion).   furosemide (LASIX) 80 MG tablet Take 1 tablet (80 mg total) by mouth daily.   glipiZIDE (GLUCOTROL XL) 10 MG 24 hr tablet Take 1 tablet (10 mg total) by mouth daily with breakfast.   hydrALAZINE (APRESOLINE) 25 MG tablet TAKE 1/2 OF A TABLET (12.5 MG TOTAL) BY MOUTH 3 TIMES A DAY   isosorbide mononitrate (IMDUR) 30 MG 24 hr tablet Take 0.5 tablets (15 mg total) by mouth daily.   JANUVIA 100 MG tablet Take 100 mg by mouth daily.   magnesium oxide (MAG-OX) 400 MG tablet TAKE 1 TABLET BY MOUTH EVERY DAY   metFORMIN (GLUCOPHAGE) 1000 MG tablet Take 1,000 mg by mouth 2 (two) times daily with a meal.   sacubitril-valsartan (ENTRESTO) 24-26 MG Take 1 tablet by mouth 2 (two) times daily.   warfarin (JANTOVEN) 5 MG tablet TAKE 1 TO 1 AND 1/2 TABLETS BY MOUTH DAILY AS DIRECTED BY THE COUMADIN CLIN (Patient taking differently: Take 5 mg by mouth daily.)   [DISCONTINUED] hydrALAZINE (APRESOLINE) 25 MG tablet Take 0.5 tablets (12.5 mg total) by mouth 3 (three) times daily.     Review of Systems negative except from HPI and PMH  Physical Exam BP 111/68   Pulse 90   Temp 97.9 F (36.6 C) (Temporal)   Resp 18   Ht 5\' 3"  (1.6 m)   Wt 80.7 kg   SpO2 100%   BMI 31.53 kg/m  Well developed and well nourished in no acute distress HENT normal E scleral and icterus clear Neck Supple JVP flat; carotids brisk and full Clear to ausculation Regular rate and rhythm, no murmurs gallops or rub Soft with active bowel sounds No clubbing cyanosis  Edema Alert and oriented, grossly normal motor and sensory function Skin Warm and Dry    Assessment and  Plan  Ischemic cardiomyopathy   Ventricular  tachycardia-sustained   Congestive heart failure-chronic-systolic class IIb-IIIa   Right bundle branch block  QRSd 130   Abnormal ECG with progressive conduction system disease   Atrial fibrillation-remote (last note that I can see is  6/18 shortly after her surgery)   Hypertension   Hyperkalemia precluding ACE/ARB/ASNI/MRA   PVCs   For ICD implant for primary prevention Have reviewed the potential benefits and risks of ICD implantation including but not limited to death, perforation of heart or lung, lead dislodgement, infection,  device malfunction and inappropriate shocks.  The patient express understanding  and is willing to proceed.

## 2022-06-30 NOTE — Progress Notes (Signed)
Cbg is 63, pt eating now pt denies symptoms, will continue to monitor

## 2022-07-03 ENCOUNTER — Encounter (HOSPITAL_COMMUNITY): Payer: Self-pay | Admitting: Internal Medicine

## 2022-07-03 MED FILL — Lidocaine HCl Local Preservative Free (PF) Inj 1%: INTRAMUSCULAR | Qty: 30 | Status: AC

## 2022-07-04 ENCOUNTER — Encounter (HOSPITAL_COMMUNITY): Payer: Self-pay | Admitting: Internal Medicine

## 2022-07-12 ENCOUNTER — Other Ambulatory Visit (HOSPITAL_COMMUNITY): Payer: No Typology Code available for payment source

## 2022-07-12 ENCOUNTER — Ambulatory Visit (INDEPENDENT_AMBULATORY_CARE_PROVIDER_SITE_OTHER): Payer: No Typology Code available for payment source

## 2022-07-12 ENCOUNTER — Ambulatory Visit (HOSPITAL_COMMUNITY)
Admission: RE | Admit: 2022-07-12 | Discharge: 2022-07-12 | Disposition: A | Discharge status: Home / Self Care | Payer: No Typology Code available for payment source | Source: Ambulatory Visit | Attending: Cardiology | Admitting: Cardiology

## 2022-07-12 DIAGNOSIS — I5022 Chronic systolic (congestive) heart failure: Secondary | ICD-10-CM | POA: Insufficient documentation

## 2022-07-12 LAB — BASIC METABOLIC PANEL
Anion gap: 9 (ref 5–15)
BUN: 28 mg/dL — ABNORMAL HIGH (ref 8–23)
CO2: 24 mmol/L (ref 22–32)
Calcium: 9.1 mg/dL (ref 8.9–10.3)
Chloride: 106 mmol/L (ref 98–111)
Creatinine, Ser: 1.37 mg/dL — ABNORMAL HIGH (ref 0.44–1.00)
GFR, Estimated: 42 mL/min — ABNORMAL LOW (ref >60)
Glucose, Bld: 80 mg/dL (ref 70–99)
Potassium: 4.4 mmol/L (ref 3.5–5.1)
Sodium: 139 mmol/L (ref 135–145)

## 2022-07-12 LAB — CUP PACEART INCLINIC DEVICE CHECK
Date Time Interrogation Session: 20230809130320
HighPow Impedance: 55 Ohm
Implantable Lead Implant Date: 20230728
Implantable Lead Location: 753860
Implantable Lead Model: 138
Implantable Lead Serial Number: 303552
Implantable Pulse Generator Implant Date: 20230728
Lead Channel Impedance Value: 529 Ohm
Lead Channel Sensing Intrinsic Amplitude: 11.7 mV
Lead Channel Setting Pacing Amplitude: 3.5 V
Lead Channel Setting Pacing Pulse Width: 0.4 ms
Lead Channel Setting Sensing Sensitivity: 0.6 mV
Pulse Gen Serial Number: 217674

## 2022-07-12 NOTE — Progress Notes (Signed)
Wound check appointment. Steri-strips removed. Wound without redness or edema. Incision edges approximated, wound well healed. Normal device function. Thresholds, sensing, and impedances consistent with implant measurements. Device programmed at 3.5V for extra safety margin until 3 month visit. Histogram distribution appropriate for patient and level of activity. No mode switches.  One episode of nonsustained tachycardia lasting 12 beats noted. Patient educated about wound care, arm mobility, lifting restrictions, shock plan. ROV in 3 months with implanting physician.

## 2022-07-12 NOTE — Patient Instructions (Signed)
   After Your ICD (Implantable Cardiac Defibrillator)    Monitor your defibrillator site for redness, swelling, and drainage. Call the device clinic at [phone] if you experience these symptoms or fever/chills.  Your incision was closed with Steri-strips or staples:  You may shower 7 days after your procedure and wash your incision with soap and water. Avoid lotions, ointments, or perfumes over your incision until it is well-healed.  You may use a hot tub or a pool after your wound check appointment if the incision is completely closed.  Do not lift, push or pull greater than 10 pounds with the affected arm until August 11 2022 . There are no other restrictions in arm movement after your wound check appointment.  Your ICD is designed to protect you from life threatening heart rhythms. Because of this, you may receive a shock.   1 shock with no symptoms:  Call the office during business hours. 1 shock with symptoms (chest pain, chest pressure, dizziness, lightheadedness, shortness of breath, overall feeling unwell):  Call 911. If you experience 2 or more shocks in 24 hours:  Call 911. If you receive a shock, you should not drive.  [place] DMV - no driving for 6 months if you receive appropriate therapy from your ICD.    Remote monitoring is used to monitor your ICD from home. This monitoring is scheduled every 91 days by our office. It allows [place] to keep an eye on the functioning of your device to ensure it is working properly. You will routinely see your Electrophysiologist annually (more often if necessary).

## 2022-08-17 ENCOUNTER — Telehealth: Payer: Self-pay | Admitting: *Deleted

## 2022-08-17 NOTE — Telephone Encounter (Signed)
Returned pt's call and scheduled appt with anticoagulation clinic for Monday 08/21/22.

## 2022-08-17 NOTE — Telephone Encounter (Signed)
Patient returned call

## 2022-08-17 NOTE — Telephone Encounter (Signed)
Called pt since she is overdue for INR monitoring, there was no answer so left a message for pt to call back. She was last seen on 06/08/2022 and was due on 06/23/22.

## 2022-08-21 ENCOUNTER — Ambulatory Visit: Payer: No Typology Code available for payment source | Attending: Cardiovascular Disease

## 2022-08-21 DIAGNOSIS — I480 Paroxysmal atrial fibrillation: Secondary | ICD-10-CM

## 2022-08-21 DIAGNOSIS — Z951 Presence of aortocoronary bypass graft: Secondary | ICD-10-CM

## 2022-08-21 DIAGNOSIS — Z7901 Long term (current) use of anticoagulants: Secondary | ICD-10-CM

## 2022-08-21 LAB — POCT INR: INR: 2.4 (ref 2.0–3.0)

## 2022-08-21 NOTE — Patient Instructions (Signed)
Description   Continue taking Warfarin 1 tablet (5mg) daily.  Recheck INR in 4 weeks.  Coumadin Clinic  [phone]       

## 2022-09-12 ENCOUNTER — Other Ambulatory Visit: Payer: Self-pay | Admitting: Cardiovascular Disease

## 2022-09-13 ENCOUNTER — Encounter (HOSPITAL_COMMUNITY): Payer: Self-pay | Admitting: Student

## 2022-09-13 ENCOUNTER — Other Ambulatory Visit: Payer: Self-pay

## 2022-09-13 ENCOUNTER — Emergency Department (HOSPITAL_COMMUNITY)
Admission: EM | Admit: 2022-09-13 | Discharge: 2022-09-14 | Disposition: A | Discharge status: Home / Self Care | Payer: No Typology Code available for payment source | Attending: Emergency Medicine | Admitting: Emergency Medicine

## 2022-09-13 DIAGNOSIS — I509 Heart failure, unspecified: Secondary | ICD-10-CM | POA: Insufficient documentation

## 2022-09-13 DIAGNOSIS — Z7982 Long term (current) use of aspirin: Secondary | ICD-10-CM | POA: Diagnosis not present

## 2022-09-13 DIAGNOSIS — Z20822 Contact with and (suspected) exposure to covid-19: Secondary | ICD-10-CM | POA: Insufficient documentation

## 2022-09-13 DIAGNOSIS — R0602 Shortness of breath: Secondary | ICD-10-CM | POA: Diagnosis present

## 2022-09-13 DIAGNOSIS — I2510 Atherosclerotic heart disease of native coronary artery without angina pectoris: Secondary | ICD-10-CM | POA: Diagnosis not present

## 2022-09-13 NOTE — ED Triage Notes (Signed)
Pt brought to ED by [facility] with c/o shortness of breath, onset began while moving objects at home today. Has hx of CHF, Lung sounds diminished in lower fields. Albuterol neb tx administered by EMS and stopped mid-treatment per pt request. 15L non-rebreather for comfort d/t chest tightness- resolved with treatment.    EMS Interventions 20g IV LFA Albuterol 5mg  neb tx (partial)   EMS Vitals BP 148/40 HR 86 RR 22 SPO2 99% RA CBG 121

## 2022-09-13 NOTE — ED Provider Triage Note (Signed)
Emergency Medicine Provider Triage Evaluation Note  Rachael Davis , a 66 y.o. female  was evaluated in triage.  Pt complains of dyspnea after doing some vacuuming tonight. Also had some chest tightness en route by EMS. Per EMS, tachypneic 26-28 breaths per minute, not hypoxic, applied NRB for comfort. Patient denies fever, chills, nausea, vomiting, or syncope. Chronic dry cough.   Hx of CHF, CABG Last EF 25-30%  Review of Systems  Per above  Physical Exam  BP 115/69 (BP Location: Right Arm)   Pulse 85   Temp 98 F (36.7 C)   Resp 18   SpO2 100%  Gen:   Awake, no distress   Resp:  Normal effort  on RA MSK:   Moves extremities without difficulty, mild pitting edema to lower legs.   Medical Decision Making  Medically screening exam initiated at 11:40 PM.  Appropriate orders placed.  Rachael Davis was informed that the remainder of the evaluation will be completed by another provider, this initial triage assessment does not replace that evaluation, and the importance of remaining in the ED until their evaluation is complete.  dyspnea   [name], PA-C 09/14/22 0010

## 2022-09-13 NOTE — ED Provider Triage Note (Incomplete)
Emergency Medicine Provider Triage Evaluation Note  Rachael Davis , a 66 y.o. female  was evaluated in triage.  Pt complains of dyspnea after doing some vacuuming tonight. Also had some chest tightness en route by EMS. Per EMS, tachypneic 26-28 breaths per minute, not hypoxic, applied NRB for comfort. Patient denies fever, chills, nausea, vomiting, or syncope. Chronic dry cough.   Hx of CHF, CABG Last EF 25-30%  Review of Systems  Per above  Physical Exam  There were no vitals taken for this visit. Gen:   Awake, no distress   Resp:  Normal effort - transitioned to 2L [place] for comfort MSK:   Moves extremities without difficulty, mild pitting edema to lower legs.   Medical Decision Making  Medically screening exam initiated at 11:40 PM.  Appropriate orders placed.  Rachael Davis was informed that the remainder of the evaluation will be completed by another provider, this initial triage assessment does not replace that evaluation, and the importance of remaining in the ED until their evaluation is complete.  ***

## 2022-09-14 ENCOUNTER — Encounter (HOSPITAL_COMMUNITY): Payer: No Typology Code available for payment source

## 2022-09-14 ENCOUNTER — Ambulatory Visit: Payer: No Typology Code available for payment source

## 2022-09-14 ENCOUNTER — Emergency Department (HOSPITAL_COMMUNITY): Payer: No Typology Code available for payment source

## 2022-09-14 ENCOUNTER — Other Ambulatory Visit (HOSPITAL_COMMUNITY): Payer: No Typology Code available for payment source

## 2022-09-14 LAB — COMPREHENSIVE METABOLIC PANEL
ALT: 23 U/L (ref 0–44)
AST: 24 U/L (ref 15–41)
Albumin: 3.9 g/dL (ref 3.5–5.0)
Alkaline Phosphatase: 125 U/L (ref 38–126)
Anion gap: 13 (ref 5–15)
BUN: 26 mg/dL — ABNORMAL HIGH (ref 8–23)
CO2: 23 mmol/L (ref 22–32)
Calcium: 9.3 mg/dL (ref 8.9–10.3)
Chloride: 105 mmol/L (ref 98–111)
Creatinine, Ser: 1.52 mg/dL — ABNORMAL HIGH (ref 0.44–1.00)
GFR, Estimated: 37 mL/min — ABNORMAL LOW (ref >60)
Glucose, Bld: 129 mg/dL — ABNORMAL HIGH (ref 70–99)
Potassium: 4 mmol/L (ref 3.5–5.1)
Sodium: 141 mmol/L (ref 135–145)
Total Bilirubin: 0.5 mg/dL (ref 0.3–1.2)
Total Protein: 7.5 g/dL (ref 6.5–8.1)

## 2022-09-14 LAB — CBC WITH DIFFERENTIAL/PLATELET
Abs Immature Granulocytes: 0 10*3/uL (ref 0.00–0.07)
Basophils Absolute: 0.1 10*3/uL (ref 0.0–0.1)
Basophils Relative: 1 %
Eosinophils Absolute: 0.1 10*3/uL (ref 0.0–0.5)
Eosinophils Relative: 2 %
HCT: 39.3 % (ref 36.0–46.0)
Hemoglobin: 11.9 g/dL — ABNORMAL LOW (ref 12.0–15.0)
Immature Granulocytes: 0 %
Lymphocytes Relative: 22 %
Lymphs Abs: 1.4 10*3/uL (ref 0.7–4.0)
MCH: 22.5 pg — ABNORMAL LOW (ref 26.0–34.0)
MCHC: 30.3 g/dL (ref 30.0–36.0)
MCV: 74.4 fL — ABNORMAL LOW (ref 80.0–100.0)
Monocytes Absolute: 0.5 10*3/uL (ref 0.1–1.0)
Monocytes Relative: 8 %
Neutro Abs: 4.4 10*3/uL (ref 1.7–7.7)
Neutrophils Relative %: 67 %
Platelets: 159 10*3/uL (ref 150–400)
RBC: 5.28 MIL/uL — ABNORMAL HIGH (ref 3.87–5.11)
RDW: 19.8 % — ABNORMAL HIGH (ref 11.5–15.5)
WBC: 6.5 10*3/uL (ref 4.0–10.5)
nRBC: 0 % (ref 0.0–0.2)

## 2022-09-14 LAB — RESP PANEL BY RT-PCR (FLU A&B, COVID) ARPGX2
Influenza A by PCR: NEGATIVE
Influenza B by PCR: NEGATIVE
SARS Coronavirus 2 by RT PCR: NEGATIVE

## 2022-09-14 LAB — BRAIN NATRIURETIC PEPTIDE: B Natriuretic Peptide: 753.5 pg/mL — ABNORMAL HIGH (ref 0.0–100.0)

## 2022-09-14 LAB — TROPONIN I (HIGH SENSITIVITY)
Troponin I (High Sensitivity): 23 ng/L — ABNORMAL HIGH (ref <18)
Troponin I (High Sensitivity): 22 ng/L — ABNORMAL HIGH (ref <18)

## 2022-09-14 MED ORDER — FUROSEMIDE 10 MG/ML IJ SOLN
60.0000 mg | Freq: Once | INTRAMUSCULAR | Status: AC
  Administered 2022-09-14: 60 mg via INTRAVENOUS

## 2022-09-14 MED ORDER — FUROSEMIDE 10 MG/ML IJ SOLN
80.0000 mg | Freq: Once | INTRAMUSCULAR | Status: DC
  Filled 2022-09-14: qty 8

## 2022-09-14 NOTE — ED Provider Notes (Signed)
[facility] EMERGENCY DEPARTMENT Provider Note   CSN: [id number] Arrival date & time: 09/13/22  2336     History  Chief Complaint  Patient presents with   Shortness of Breath    Rachael Davis is a 66 y.o. female.  With PMH of CAD, CHF R EF with ICD placement on anticoagulation who presents with episode of shortness of breath yesterday after vacuuming and doing housework.  She was in her house doing housework and vacuuming for an extended period of time and experienced worsening shortness of breath.  She sat down and initially did not have improvement and called EMS who gave her albuterol treatment without improvement.  Since being in the hospital and waiting in the waiting room, she has had no further symptoms and is asymptomatic.  She has had no chest pain, no syncopal episode, no defibrillation events, no fevers, no vomiting, no diarrhea, no leg pain or swelling, no orthopnea or PND.  She is compliant with all of her medications.  She has not taken any of her home medications today since being in the ER for multiple hours.   Shortness of Breath      Home Medications Prior to Admission medications   Medication Sig Start Date End Date Taking? Authorizing Provider  acetaminophen (TYLENOL) 325 MG tablet Take 2 tablets (650 mg total) by mouth every 6 (six) hours as needed for mild pain or headache. 05/28/17   Ardelle Balls, PA-C  aspirin EC 81 MG tablet Take 81 mg by mouth daily.    [provider]  atorvastatin (LIPITOR) 40 MG tablet TAKE 1 TABLET BY MOUTH DAILY AT 6 PM. 09/12/22   Chilton Si, MD  carvedilol (COREG) 3.125 MG tablet Take 2 tablets (6.25 mg total) by mouth 2 (two) times daily. 05/30/22   Duke Salvia, MD  dapagliflozin propanediol (FARXIGA) 10 MG TABS tablet Take 10 mg by mouth daily.    [provider]  DM-APAP-CPM (CORICIDIN HBP PO) Take 2 tablets by mouth daily as needed (congestion).    [provider]   furosemide (LASIX) 80 MG tablet Take 1 tablet (80 mg total) by mouth daily. 04/17/22   Jacklynn Ganong, FNP  glipiZIDE (GLUCOTROL XL) 10 MG 24 hr tablet Take 1 tablet (10 mg total) by mouth daily with breakfast. 05/28/17   Doree Fudge M, PA-C  hydrALAZINE (APRESOLINE) 25 MG tablet TAKE 1/2 OF A TABLET (12.5 MG TOTAL) BY MOUTH 3 TIMES A DAY 06/26/22   Andrey Farmer, PA-C  isosorbide mononitrate (IMDUR) 30 MG 24 hr tablet Take 0.5 tablets (15 mg total) by mouth daily. 05/19/22   Andrey Farmer, PA-C  JANUVIA 100 MG tablet Take 100 mg by mouth daily. 03/14/19   [provider]  magnesium oxide (MAG-OX) 400 MG tablet TAKE 1 TABLET BY MOUTH EVERY DAY 10/01/20   Chilton Si, MD  metFORMIN (GLUCOPHAGE) 1000 MG tablet Take 1,000 mg by mouth 2 (two) times daily with a meal.    [provider]  sacubitril-valsartan (ENTRESTO) 24-26 MG Take 1 tablet by mouth 2 (two) times daily. 06/14/22   Bensimhon, Bevelyn Buckles, MD  warfarin (JANTOVEN) 5 MG tablet TAKE 1 TO 1 AND 1/2 TABLETS BY MOUTH DAILY AS DIRECTED BY THE COUMADIN CLIN Patient taking differently: Take 5 mg by mouth daily. 06/07/22   Chilton Si, MD      Allergies    Keflex [cephalexin], Asa [aspirin], and Lisinopril    Review of Systems  Review of Systems  Respiratory:  Positive for shortness of breath.     Physical Exam Updated Vital Signs BP 139/80 (BP Location: Right Arm)   Pulse 81   Temp 97.8 F (36.6 C)   Resp 16   SpO2 100%  Physical Exam Constitutional: Alert and oriented. Well appearing and in no distress. Eyes: Conjunctivae are normal. ENT      Head: Normocephalic and atraumatic.      Nose: No congestion.      Mouth/Throat: Mucous membranes are moist.      Neck: No stridor. Cardiovascular: S1, S2,  Normal and symmetric distal pulses are present in all extremities.Warm and well perfused. Respiratory: Normal respiratory effort. Breath sounds are normal. O2 sat 100 on  RA Gastrointestinal: Soft and nontender. There is no CVA tenderness. Musculoskeletal: Normal range of motion in all extremities.      Right lower leg: No tenderness or edema.      Left lower leg: No tenderness or edema. Neurologic: Normal speech and language. No gross focal neurologic deficits are appreciated. Skin: Skin is warm, dry and intact. No rash noted. Psychiatric: Mood and affect are normal. Speech and behavior are normal.  ED Results / Procedures / Treatments   Labs (all labs ordered are listed, but only abnormal results are displayed) Labs Reviewed  COMPREHENSIVE METABOLIC PANEL - Abnormal; Notable for the following components:      Result Value   Glucose, Bld 129 (*)    BUN 26 (*)    Creatinine, Ser 1.52 (*)    GFR, Estimated 37 (*)    All other components within normal limits  CBC WITH DIFFERENTIAL/PLATELET - Abnormal; Notable for the following components:   RBC 5.28 (*)    Hemoglobin 11.9 (*)    MCV 74.4 (*)    MCH 22.5 (*)    RDW 19.8 (*)    All other components within normal limits  BRAIN NATRIURETIC PEPTIDE - Abnormal; Notable for the following components:   B Natriuretic Peptide 753.5 (*)    All other components within normal limits  TROPONIN I (HIGH SENSITIVITY) - Abnormal; Notable for the following components:   Troponin I (High Sensitivity) 23 (*)    All other components within normal limits  TROPONIN I (HIGH SENSITIVITY) - Abnormal; Notable for the following components:   Troponin I (High Sensitivity) 22 (*)    All other components within normal limits  RESP PANEL BY RT-PCR (FLU A&B, COVID) [id number]    EKG EKG Interpretation  Date/Time:  Wednesday September 13 2022 23:50:08 EDT Ventricular Rate:  84 PR Interval:  272 QRS Duration: 126 QT Interval:  418 QTC Calculation: 493 R Axis:   -51 Text Interpretation: Sinus rhythm with 1st degree A-V block Left axis deviation Right bundle branch block Minimal voltage criteria for LVH, may be normal variant ( R  in aVL ) Inferior infarct , age undetermined Anterolateral infarct , age undetermined Abnormal ECG When compared with ECG of 30-Jun-2022 13:43, No significant change was found Confirmed by Dione Booze ([id number]) on 09/14/2022 4:05:32 AM  Radiology DG Chest 2 View  Result Date: 09/14/2022 CLINICAL DATA:  Dyspnea, shortness of breath, chest tightness. EXAM: CHEST - 2 VIEW COMPARISON:  06/30/2022. FINDINGS: The heart is enlarged and the mediastinal contour is stable. There is evidence of prior cardiothoracic surgery with a prosthetic cardiac valve. A single lead pacemaker device is present over the left chest. No consolidation, effusion, or pneumothorax. Minimal atelectasis is noted at the left lung base. Degenerative  changes are present in the thoracic spine. IMPRESSION: 1. Mild atelectasis at the left lung base. 2. Cardiomegaly. Electronically Signed   By: Brett Fairy M.D.   On: 09/14/2022 00:14    Procedures Procedures    Medications Ordered in ED Medications  furosemide (LASIX) injection 60 mg (60 mg Intravenous Given 09/14/22 1453)    ED Course/ Medical Decision Making/ A&P                           Medical Decision Making Rachael Davis is a 66 y.o. female.  With PMH of CAD, CHF R EF with ICD placement on anticoagulation who presents with episode of shortness of breath yesterday after vacuuming and doing housework.    Patient presented with episode of dyspnea on exertion which has since resolved.  Consider pneumonia, fluid overload, anemia, multiple other etiologies.  No concern for PE with no signs of DVT on exam, no recent hospitalization, no recent travel and properly anticoagulated.  Her chest x-ray was obtained which I personally interpreted and showed no evidence of consolidation concerning for pneumonia, no pleural effusion, no pneumothorax.  I am not concerned for atypical ACS with no associated chest pain and currently asymptomatic with EKG unchanged from prior.  Additionally, she  has had 2 high sensitive troponins which have remained flat and down trended.  COVID RSV and flu negative.  Hemoglobin 11.9 and stable for patient.  BNP slightly elevated from baseline 753 with mild cardiomegaly on chest x-ray.  I ambulated patient with no distress.  No desaturation upon ambulation.  She is asymptomatic at this time and has not taken any of her Lasix today.  With increased BNP suspect some mild stretch from underlying cardiomegaly/HFrEF and dyspnea on exertion likely due to underlying known heart failure.  Gave IV dose of 60 mg Lasix and advised close follow-up with cardiologist and strict return precautions.  She is safe for discharge at this time.  Risk Prescription drug management.   Final Clinical Impression(s) / ED Diagnoses Final diagnoses:  SOB (shortness of breath)    Rx / DC Orders ED Discharge Orders     None         Elgie Congo, MD 09/14/22 2232

## 2022-09-14 NOTE — Discharge Instructions (Addendum)
You were seen in the ER after an episode of shortness of breath and generally feeling unwell yesterday but improved by the time evaluation in the ER.  Overall, your work-up was generally reassuring including blood work, EKG, chest x-ray and labs.  There was no evidence of pneumonia or fluid on the lungs.  We did give you a slightly higher dose of IV Lasix today in the ER.  You do not need to take your home Lasix dose today.  However take all of your other home medications as prescribed.  Make sure to make an appointment with your cardiologist to make a follow-up appointment.  However come back if you have any return of severe chest pain, worsening shortness of breath, fainting episodes, or any other symptoms concerning to you.

## 2022-09-14 NOTE — Progress Notes (Signed)
Heart Failure Navigator Progress Note  Assessed for Heart & Vascular TOC clinic readiness.  Prior to hospitalization care established with AHF clinic and Dr. Haroldine Laws. Last seen in 07/23.   HF Navigation team will sign-off.     Pricilla Holm, MSN, RN Heart Failure Nurse Navigator

## 2022-09-22 ENCOUNTER — Ambulatory Visit: Payer: No Typology Code available for payment source

## 2022-09-25 ENCOUNTER — Encounter (HOSPITAL_COMMUNITY): Payer: No Typology Code available for payment source

## 2022-09-26 ENCOUNTER — Ambulatory Visit: Payer: No Typology Code available for payment source | Attending: Cardiovascular Disease | Admitting: *Deleted

## 2022-09-26 DIAGNOSIS — Z951 Presence of aortocoronary bypass graft: Secondary | ICD-10-CM | POA: Diagnosis not present

## 2022-09-26 DIAGNOSIS — Z7901 Long term (current) use of anticoagulants: Secondary | ICD-10-CM

## 2022-09-26 DIAGNOSIS — I480 Paroxysmal atrial fibrillation: Secondary | ICD-10-CM

## 2022-09-26 LAB — POCT INR: INR: 1.9 — AB (ref 2.0–3.0)

## 2022-09-26 NOTE — Patient Instructions (Signed)
Description   Today take 1.5 tablets (7.5mg ) then continue taking Warfarin 1 tablet (5mg ) daily. Recheck INR in 4 weeks. Coumadin Clinic  [phone]

## 2022-09-27 NOTE — Progress Notes (Signed)
Advanced Heart Failure Clinic Note   Primary Care: Donald Prose, MD Primary Cardiologist: Dr. Oval Linsey HF Cardiologist: Dr. Haroldine Laws  HPI:  66 y.o.female with history of CAD s/p CABG 99991111, chronic systolic CHF/iCM, hx bioprosthetic MV replacement at time of CABG, PVCs, DM, remote AF (shortly after CABG 2018).   She was previously followed by Dr. Haroldine Laws in [place] Failure clinic, graduated from clinic in 05/2019. Echo 05/2019: EF 35-40%. Has been followed by Dr. Oval Linsey with Cardiology.  Echo 01/22: EF 30-35%  Echo 01/23: EF 25-30%, RV moderately reduced, RVSP 38.8 mmHg, trivial MR, mean gradient 7 mmHg across mitral valve prosthesis, IVC dilated with estimated RAP 15 mmHg  Saw EP in 03/23. D/t presence of progressive conduction system disease workup for amyloidosis recommended. Unable to tolerate cMRI d/t claustrophobia.   7 day Zio 04/23: SR with avg rate 83 bpm, 1st degree AVB and intermittent Wenckebach AV block noted. 2.3% PVCs. 6 runs VT, longest 27 min and 48 seconds. ICD recommended. She wanted to discuss with family before proceeding.  Admitted 05/23 with a/c CHF. Diuresed with IV lasix. Discharged on furosemide 80 mg daily. GDMT - coreg 3.125 mg BID and dapagliflozin 10 mg daily.   Seen in [facility] clinic 04/17/22. Added back Entresto, later stopped when she developed hyperkalemia. Felt better when she was on Entresto in the past. LifeVest placed dt/ episode sustained VT on monitor in April.  PYP scan 04/20/22 equivocal for TTR amyloidosis.  R/LHC 04/20/22: LIMA to LAD and SVG to OM patent, occluded SVG to RCA and SVG to Ramus, normal LVEDP, moderate mixed PAH, severe mitral stenosis (felt to be overestimated). Med management of CAD recommended, if has refractory angina could consider CTO RCA.  S/p [employer] SCI ICD implant 7/23.   Seen in ED 09/14/22 with SOB. Given IV lasix with improvement of symptoms.  Today she returns for HF follow up. Overall feeling fine. She  does not have SOB walking short distances on flat ground. Walks outside some for exercise, 2x/week for 10-15 minutes at a time with her grandchildren. Continues with L upper chest soreness from ICD implant. Feels palpitations at night. Denies abnormal bleeding, CP, dizziness, edema, or PND/Orthopnea. Appetite ok. No fever or chills. She does not weigh at home. Taking all medications. 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  Cardiac Studies - R/LHC (5/23):  1. Severe 3v CAD 2. LIMA to LAD and SVG to OM-1 widely patent 3. SVG to RCA and SVG to Ramus occluded 4. LVEF 25% 5. Normal LVEDP 6. Moderate mixed PAH 7. Apparent severe mitral stenosis (suspect overestimated based on echo)  Ao = 126/65 (90)  LV = 114/13 RA =  10 RV = 52/9 PA = 50/18 (33) PCW = 22 (v = 30) Fick cardiac output/index = 4.6/2.6 PVR = 2.4 WU FA sat = 94% PA sat = 60%, 63% MV gradient on LV-wedge tracing = mean 13.0 MVA 0.93cm2 PAPi = 3.2  - PYP (5/23): equivocal for TTR amyloidosis  - Zio (4/23): SR with avg rate 83 bpm, 1st degree AVB and intermittent Wenckebach AV block noted. 2.3% PVCs. 6 runs VT, longest 27 min and 48 seconds.   - Echo (1/23): EF 25-30%, RV moderately reduced, RVSP 38.8 mmHg, trivial MR, mean gradient 7 mmHg across mitral valve prosthesis, IVC dilated with estimated RAP 15 mmHg  - Echo (1/22): EF 30-35%  - Echo (6/20): EF 35-40%.   Review of Systems: Cardiac and respiratory. Negative except as  mentioned in HPI.  Past Medical History:  Diagnosis Date   Arrhythmia    Hx PAF, PVC's   CHF (congestive heart failure) (HCC)    Coronary artery disease    04/04/2017 STEMI   Diabetes [facility])    H/O mitral valve replacement 09/21/2021   Heart attack ([place])    per pt reprot   Hyperlipidemia    Hypertension    New onset atrial fibrillation ([place]) 05/2017    Current Outpatient Medications  Medication Sig Dispense Refill   acetaminophen (TYLENOL) 325 MG tablet Take 2 tablets (650  mg total) by mouth every 6 (six) hours as needed for mild pain or headache.     aspirin EC 81 MG tablet Take 81 mg by mouth daily.     atorvastatin (LIPITOR) 40 MG tablet TAKE 1 TABLET BY MOUTH DAILY AT 6 PM. 90 tablet 0   carvedilol (COREG) 3.125 MG tablet Take 2 tablets (6.25 mg total) by mouth 2 (two) times daily. 180 tablet 3   dapagliflozin propanediol (FARXIGA) 10 MG TABS tablet Take 10 mg by mouth daily.     DM-APAP-CPM (CORICIDIN HBP PO) Take 2 tablets by mouth daily as needed (congestion).     furosemide (LASIX) 80 MG tablet Take 1 tablet (80 mg total) by mouth daily. 90 tablet 1   glipiZIDE (GLUCOTROL XL) 10 MG 24 hr tablet Take 1 tablet (10 mg total) by mouth daily with breakfast.     hydrALAZINE (APRESOLINE) 25 MG tablet TAKE 1/2 OF A TABLET (12.5 MG TOTAL) BY MOUTH 3 TIMES A DAY 135 tablet 1   isosorbide mononitrate (IMDUR) 30 MG 24 hr tablet Take 0.5 tablets (15 mg total) by mouth daily. 45 tablet 3   magnesium oxide (MAG-OX) 400 MG tablet TAKE 1 TABLET BY MOUTH EVERY DAY 90 tablet 3   metFORMIN (GLUCOPHAGE) 1000 MG tablet Take 1,000 mg by mouth 2 (two) times daily with a meal.     sacubitril-valsartan (ENTRESTO) 24-26 MG Take 1 tablet by mouth 2 (two) times daily. 60 tablet 3   warfarin (JANTOVEN) 5 MG tablet TAKE 1 TO 1 AND 1/2 TABLETS BY MOUTH DAILY AS DIRECTED BY THE COUMADIN CLIN (Patient taking differently: Take 5 mg by mouth daily.) 100 tablet 1   No current facility-administered medications for this encounter.   Allergies  Allergen Reactions   Keflex [Cephalexin] Rash    Skin on feet peeled.   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   Tobacco Use   Smoking status: Never   Smokeless tobacco: Never  Vaping Use   Vaping Use: Never used  Substance and Sexual Activity   Alcohol use: No   Drug use: Yes    Types: Marijuana    Comment: In her 21 's   Sexual activity: Not on file  Other Topics Concern   Not on file  Social History Narrative   Not on file   Social Determinants of Health   Financial Resource Strain: Not on file  Food Insecurity: No Food Insecurity (04/06/2022)   Hunger Vital Sign    Worried About  Running Out of Food in the Last Year: Never true    [place] in the Last Year: Never true  Transportation Needs: No Transportation Needs (04/06/2022)   PRAPARE - Hydrologist (Medical): No    Lack of Transportation (Non-Medical): No  Physical Activity: Not on file  Stress: Not on file  Social Connections: Not on file  Intimate Partner Violence: Not on file   Family History  Problem Relation Age of Onset   Heart attack Father 40   Breast cancer Maternal Grandmother    BP 134/76   Pulse 83   Wt 82.5 kg (181 lb 12.8 oz)   SpO2 100%   BMI 32.20 kg/m   Wt Readings from Last 3 Encounters:  09/29/22 82.5 kg (181 lb 12.8 oz)  06/30/22 80.7 kg (178 lb)  06/14/22 78.7 kg (173 lb 9.6 oz)   PHYSICAL EXAM: General:  NAD. No resp difficulty HEENT: Normal Neck: Supple. JVP to jaw. Carotids 2+ bilat; no bruits. No lymphadenopathy appreciated. + groiter Cor: PMI nondisplaced. Regular rate & rhythm. No rubs, gallops or murmurs. Lungs: Clear Abdomen: Soft, obse, nontender, nondistended. No hepatosplenomegaly. No bruits or masses. Good bowel sounds. Extremities: No cyanosis, clubbing, rash, edema Neuro: Alert & oriented x 3, cranial nerves grossly intact. Moves all 4 extremities w/o difficulty. Affect pleasant.  REDs: 40%  ASSESSMENT & PLAN: Acute on chronic Systolic Heart Failure/ICM - Echo 02/06/2018 LVEF 35-40% - Echo 6/20 EF 35-40% - Echo 1/22: EF 30-35% - Echo 1/23 EF  25-30% (appears closer to 20% on review with Dr. Haroldine Laws) - Sustained VT on zio 04/23. Initially wanted to think about ICD, now willing to consider. Wearing LifeVest, has f/u with EP  - R/LHC 04/20/22: LIMA to LAD and SVG to OM patent, occluded SVG to RCA and SVG to Ramus, normal LVEDP, moderate mixed PAH, severe mitral stenosis (felt to be overestimated). Med management of CAD recommended, if has refractory angina could consider CTO RCA. - Unable to tolerate MRI d/t claustrophobia.PYP scan from 05/23 negative. - s/p [employer] SCI ICD 7/23 - NYHA II currently. Volume up on exam, ReDs 40% - Increase Lasix 80 mg bid, add 40 KCL daily. - Continue Coreg 6.25 mg bid. Follow carefully with baseline conduction system disease.  - Continue Farxiga 10 mg daily. - Continue Entresto 24/26 mg bid. - Continue hydralazine 12.5 mg tid + Imdur 15 mg daily. - Off spiro d/t hyperkalemia.  - Labs today. BMET in 7-10 days.   2. CAD S/P CABG x4: - LHC 04/20/22: LIMA to LAD and SVG to OM patent, occluded SVG to RCA and SVG to Ramus - Managing CAD medically as above. - No recent angina. - Continue statin and ASA.  3. Bioprosthetic MVR:  - Echo 1/23: EF 25-30%, trivial MR, mild mitral stenosis with mean gradient 7 mmHg  - RHC 5/23: MV gradient on LV wedge tracing - mean 13, MVA 0.93 cm2, suggestive of severe MS (likely overestimated when compared to echo) - Will need to follow. - Continue warfarin and ASA. Denies bleeding. - Reminded about SBE prophylaxis   4. VT/PVCs:  - 7 day Zio, 04/23: 2.3% PVCs, sustained VT (longest 27 minutes) - s/p ICD implant 06/30/22. Unable to interrogate device in clinic today.  5. PAF - Regular on exam - Continue warfarin. Denies bleeding  6. DM2 - Hgb A1c 6.7 in 5/23.  7. Goiter - Thyroid nodule biopsied 54/62, benign follicular nodule - TSH normal 5/23 -  Overdue for follow-up with Dr. Renne Crigler.    Follow up in 3 weeks with APP (ReDs) and 4 months with Dr.  Haroldine Laws.  Rafael Bihari, FNP  10:08 AM

## 2022-09-29 ENCOUNTER — Encounter (HOSPITAL_COMMUNITY): Payer: Self-pay

## 2022-09-29 ENCOUNTER — Ambulatory Visit (HOSPITAL_COMMUNITY)
Admission: RE | Admit: 2022-09-29 | Discharge: 2022-09-29 | Disposition: A | Discharge status: Home / Self Care | Payer: No Typology Code available for payment source | Source: Ambulatory Visit | Attending: Family Medicine | Admitting: Family Medicine

## 2022-09-29 VITALS — BP 134/76 | HR 83 | Wt 181.8 lb

## 2022-09-29 DIAGNOSIS — E041 Nontoxic single thyroid nodule: Secondary | ICD-10-CM | POA: Diagnosis not present

## 2022-09-29 DIAGNOSIS — Z952 Presence of prosthetic heart valve: Secondary | ICD-10-CM | POA: Diagnosis not present

## 2022-09-29 DIAGNOSIS — Z79899 Other long term (current) drug therapy: Secondary | ICD-10-CM | POA: Insufficient documentation

## 2022-09-29 DIAGNOSIS — Z95810 Presence of automatic (implantable) cardiac defibrillator: Secondary | ICD-10-CM | POA: Insufficient documentation

## 2022-09-29 DIAGNOSIS — Z951 Presence of aortocoronary bypass graft: Secondary | ICD-10-CM | POA: Diagnosis not present

## 2022-09-29 DIAGNOSIS — E119 Type 2 diabetes mellitus without complications: Secondary | ICD-10-CM | POA: Diagnosis not present

## 2022-09-29 DIAGNOSIS — Z7984 Long term (current) use of oral hypoglycemic drugs: Secondary | ICD-10-CM | POA: Diagnosis not present

## 2022-09-29 DIAGNOSIS — Z953 Presence of xenogenic heart valve: Secondary | ICD-10-CM | POA: Diagnosis not present

## 2022-09-29 DIAGNOSIS — R002 Palpitations: Secondary | ICD-10-CM | POA: Insufficient documentation

## 2022-09-29 DIAGNOSIS — I441 Atrioventricular block, second degree: Secondary | ICD-10-CM | POA: Diagnosis not present

## 2022-09-29 DIAGNOSIS — Z8249 Family history of ischemic heart disease and other diseases of the circulatory system: Secondary | ICD-10-CM | POA: Diagnosis not present

## 2022-09-29 DIAGNOSIS — Z7901 Long term (current) use of anticoagulants: Secondary | ICD-10-CM | POA: Diagnosis not present

## 2022-09-29 DIAGNOSIS — I2510 Atherosclerotic heart disease of native coronary artery without angina pectoris: Secondary | ICD-10-CM | POA: Diagnosis not present

## 2022-09-29 DIAGNOSIS — E049 Nontoxic goiter, unspecified: Secondary | ICD-10-CM

## 2022-09-29 DIAGNOSIS — I5023 Acute on chronic systolic (congestive) heart failure: Secondary | ICD-10-CM | POA: Diagnosis not present

## 2022-09-29 DIAGNOSIS — Z006 Encounter for examination for normal comparison and control in clinical research program: Secondary | ICD-10-CM

## 2022-09-29 DIAGNOSIS — I5022 Chronic systolic (congestive) heart failure: Secondary | ICD-10-CM

## 2022-09-29 DIAGNOSIS — I472 Ventricular tachycardia, unspecified: Secondary | ICD-10-CM | POA: Diagnosis not present

## 2022-09-29 DIAGNOSIS — I110 Hypertensive heart disease with heart failure: Secondary | ICD-10-CM | POA: Insufficient documentation

## 2022-09-29 DIAGNOSIS — I480 Paroxysmal atrial fibrillation: Secondary | ICD-10-CM | POA: Diagnosis not present

## 2022-09-29 DIAGNOSIS — I25810 Atherosclerosis of coronary artery bypass graft(s) without angina pectoris: Secondary | ICD-10-CM | POA: Diagnosis not present

## 2022-09-29 LAB — BASIC METABOLIC PANEL
Anion gap: 11 (ref 5–15)
BUN: 26 mg/dL — ABNORMAL HIGH (ref 8–23)
CO2: 24 mmol/L (ref 22–32)
Calcium: 9.4 mg/dL (ref 8.9–10.3)
Chloride: 108 mmol/L (ref 98–111)
Creatinine, Ser: 1.27 mg/dL — ABNORMAL HIGH (ref 0.44–1.00)
GFR, Estimated: 46 mL/min — ABNORMAL LOW (ref >60)
Glucose, Bld: 67 mg/dL — ABNORMAL LOW (ref 70–99)
Potassium: 5.1 mmol/L (ref 3.5–5.1)
Sodium: 143 mmol/L (ref 135–145)

## 2022-09-29 LAB — BRAIN NATRIURETIC PEPTIDE: B Natriuretic Peptide: 489.1 pg/mL — ABNORMAL HIGH (ref 0.0–100.0)

## 2022-09-29 MED ORDER — POTASSIUM CHLORIDE CRYS ER 20 MEQ PO TBCR: 40.0000 meq | EXTENDED_RELEASE_TABLET | Freq: Two times a day (BID) | ORAL | 8 refills | Status: DC

## 2022-09-29 MED ORDER — FUROSEMIDE 80 MG PO TABS: 80.0000 mg | ORAL_TABLET | Freq: Two times a day (BID) | ORAL | 11 refills | Status: DC

## 2022-09-29 NOTE — Progress Notes (Signed)
ReDS Vest / Clip - 09/29/22 0900       ReDS Vest / Clip   Station Marker A    Ruler Value 27    ReDS Value Range Moderate volume overload    ReDS Actual Value 40

## 2022-09-29 NOTE — Patient Instructions (Signed)
Thank you for coming in today  Labs were done today, if any labs are abnormal the clinic will call you No news is good news  INCREASE Lasix to 80 mg 1 tablet twice daily  START Potassium 40 meq 2 tablets daily   Your physician recommends that you return for lab work in:  7-10 days BMET   Your physician recommends that you schedule a follow-up appointment in:  3 weeks in clinic  3-4 months with Dr. Aundra Dubin    Do the following things EVERYDAY: Weigh yourself in the morning before breakfast. Write it down and keep it in a log. Take your medicines as prescribed Eat low salt foods--Limit salt (sodium) to 2000 mg per day.  Stay as active as you can everyday Limit all fluids for the day to less than 2 liters  At the [facility], you and your health needs are our priority. As part of our continuing mission to provide you with exceptional heart care, we have created designated Provider Care Teams. These Care Teams include your primary Cardiologist (physician) and Advanced Practice Providers (APPs- Physician Assistants and Nurse Practitioners) who all work together to provide you with the care you need, when you need it.   You may see any of the following providers on your designated Care Team at your next follow up: Dr Glori Bickers Dr Loralie Champagne Dr. Roxana Hires, NP Lyda Jester, [place] [facility] [place], [place] Forestine Na, NP Audry Riles, PharmD   Please be sure to bring in all your medications bottles to every appointment.   If you have any questions or concerns before your next appointment please send [place] a message through [place] or call our office at [phone].    TO LEAVE A MESSAGE FOR THE NURSE SELECT OPTION 2, PLEASE LEAVE A MESSAGE INCLUDING: YOUR NAME DATE OF BIRTH CALL BACK NUMBER REASON FOR CALL**this is important as we prioritize the call backs  YOU WILL RECEIVE A CALL BACK THE SAME DAY AS LONG AS YOU CALL BEFORE  4:00 PM

## 2022-10-02 DIAGNOSIS — I472 Ventricular tachycardia, unspecified: Secondary | ICD-10-CM | POA: Insufficient documentation

## 2022-10-02 DIAGNOSIS — Z95810 Presence of automatic (implantable) cardiac defibrillator: Secondary | ICD-10-CM | POA: Insufficient documentation

## 2022-10-02 DIAGNOSIS — I4510 Unspecified right bundle-branch block: Secondary | ICD-10-CM | POA: Insufficient documentation

## 2022-10-02 DIAGNOSIS — I493 Ventricular premature depolarization: Secondary | ICD-10-CM | POA: Insufficient documentation

## 2022-10-03 ENCOUNTER — Ambulatory Visit: Payer: No Typology Code available for payment source | Attending: Internal Medicine | Admitting: Internal Medicine

## 2022-10-03 ENCOUNTER — Encounter: Payer: Self-pay | Admitting: Internal Medicine

## 2022-10-03 VITALS — BP 118/66 | HR 85 | Ht 63.0 in | Wt 179.0 lb

## 2022-10-03 DIAGNOSIS — Z95810 Presence of automatic (implantable) cardiac defibrillator: Secondary | ICD-10-CM

## 2022-10-03 DIAGNOSIS — I5022 Chronic systolic (congestive) heart failure: Secondary | ICD-10-CM | POA: Diagnosis not present

## 2022-10-03 DIAGNOSIS — I255 Ischemic cardiomyopathy: Secondary | ICD-10-CM | POA: Diagnosis not present

## 2022-10-03 DIAGNOSIS — I472 Ventricular tachycardia, unspecified: Secondary | ICD-10-CM | POA: Diagnosis not present

## 2022-10-03 DIAGNOSIS — I4510 Unspecified right bundle-branch block: Secondary | ICD-10-CM | POA: Diagnosis not present

## 2022-10-03 DIAGNOSIS — I493 Ventricular premature depolarization: Secondary | ICD-10-CM

## 2022-10-03 DIAGNOSIS — I480 Paroxysmal atrial fibrillation: Secondary | ICD-10-CM

## 2022-10-03 LAB — CUP PACEART INCLINIC DEVICE CHECK
Date Time Interrogation Session: 20231031160833
HighPow Impedance: 58 Ohm
Implantable Lead Connection Status: 753985
Implantable Lead Implant Date: 20230728
Implantable Lead Location: 753860
Implantable Lead Model: 138
Implantable Lead Serial Number: 303552
Implantable Pulse Generator Implant Date: 20230728
Lead Channel Impedance Value: 529 Ohm
Lead Channel Pacing Threshold Amplitude: 0.6 V
Lead Channel Pacing Threshold Pulse Width: 0.4 ms
Lead Channel Sensing Intrinsic Amplitude: 10.8 mV
Lead Channel Setting Pacing Amplitude: 2.5 V
Lead Channel Setting Pacing Pulse Width: 0.4 ms
Lead Channel Setting Sensing Sensitivity: 0.6 mV
Pulse Gen Serial Number: 217674

## 2022-10-03 NOTE — Progress Notes (Signed)
Patient Care Team: Deatra James, MD as PCP - General (Family Medicine) Chilton Si, MD as PCP - Cardiology (Cardiology)   HPI  Rachael Davis is a 66 y.o. female seen in follow-up for an ICD for primary prevention in the setting of ischemic heart disease and a mitral valve bioprosthesis.  When she was seen however she was noted to have significant PVCs and worsening symptoms since discontinuation of Entresto   Interval hospitalization for heart failure 5/23   Sustained VT noted on the monitor 4/23-- deferred decision as wanted to discuss with family.  Not been able to tolerate Entresto or Spyro because of hyperkalemia Lokelma was cost prohibitive\\  Short of breath about 100 yds, no nocturnal dyspnea; chronic two-pillow orthopnea.  Peripheral edema. The patient denies chest pain, nocturnal dyspnea, orthopnea or peripheral edema.  There have been no palpitations, lightheadedness or syncope.  Complains of shortness of breath.  She did not open emergency room requiring increased Lasix.  She did see in the heart failure clinic and her diuretics were also increased.Marland Kitchen    DATE TEST EF    5/18 LHC   3v CAD  3/19 Echo   35-40 %    1/23 Echo   25-30 %    3/23 Myoview  17% Fixed defect  5/23 PYP  Non diagnostic  5/23 LHC  LIMA-LADp;  SVG-RCA; SVG-Ramus occluded    Date Cr K Hgb  2/23 1.4 5.5    6/23 1.33 4.1 11.2  10/23 1.27 5.1 11.9    Records and Results Reviewed   Past Medical History:  Diagnosis Date   Arrhythmia    Hx PAF, PVC's   CHF (congestive heart failure) (HCC)    Coronary artery disease    04/04/2017 STEMI   Diabetes [facility])    H/O mitral valve replacement 09/21/2021   Heart attack (HCC)    per pt reprot   Hyperlipidemia    Hypertension    New onset atrial fibrillation (HCC) 05/2017    Past Surgical History:  Procedure Laterality Date   CORONARY ARTERY BYPASS GRAFT N/A 04/12/2017   Procedure: CORONARY ARTERY BYPASS GRAFTING (CABG), ON PUMP, TIMES FOUR,  USING LEFT INTERNAL MAMMARY ARTERY AND ENDOSCOPICALLY HARVESTED RIGHT GREATER SAPHENOUS VEIN;  Surgeon: Kerin Perna, MD;  Location: [facility] OR;  Service: Open Heart Surgery;  Laterality: N/A;  LIMA to LAD; SVG to OM; SVG to Ramus; SVG to Right   ICD IMPLANT N/A 06/30/2022   Procedure: ICD IMPLANT;  Surgeon: Duke Salvia, MD;  Location: [facility] INVASIVE CV LAB;  Service: Cardiovascular;  Laterality: N/A;   IR THORACENTESIS ASP PLEURAL SPACE W/IMG GUIDE  05/25/2017   LEFT HEART CATH AND CORONARY ANGIOGRAPHY N/A 04/04/2017   Procedure: Left Heart Cath and Coronary Angiography;  Surgeon: Peter M [name], MD;  Location: [facility] INVASIVE CV LAB;  Service: Cardiovascular;  Laterality: N/A;   MITRAL VALVE REPAIR N/A 04/12/2017   Procedure: MITRAL VALVE REPLACEMENT (MVR);  Surgeon: Donata Clay, Theron Arista, MD;  Location: [facility] OR;  Service: Open Heart Surgery;  Laterality: N/A;  Using a 10mm Magna Ease Mitral Pericardial Bioprosthesis   RIGHT/LEFT HEART CATH AND CORONARY/GRAFT ANGIOGRAPHY N/A 04/20/2022   Procedure: RIGHT/LEFT HEART CATH AND CORONARY/GRAFT ANGIOGRAPHY;  Surgeon: Dolores Patty, MD;  Location: [facility] INVASIVE CV LAB;  Service: Cardiovascular;  Laterality: N/A;   TEE WITHOUT CARDIOVERSION N/A 04/12/2017   Procedure: TRANSESOPHAGEAL ECHOCARDIOGRAM (TEE);  Surgeon: Donata Clay, Theron Arista, MD;  Location: [facility] OR;  Service: Open Heart  Surgery;  Laterality: N/A;   TUBAL LIGATION      Current Meds  Medication Sig   acetaminophen (TYLENOL) 325 MG tablet Take 2 tablets (650 mg total) by mouth every 6 (six) hours as needed for mild pain or headache.   aspirin EC 81 MG tablet Take 81 mg by mouth daily.   atorvastatin (LIPITOR) 40 MG tablet TAKE 1 TABLET BY MOUTH DAILY AT 6 PM.   carvedilol (COREG) 3.125 MG tablet Take 2 tablets (6.25 mg total) by mouth 2 (two) times daily.   dapagliflozin propanediol (FARXIGA) 10 MG TABS tablet Take 10 mg by mouth daily.   DM-APAP-CPM (CORICIDIN HBP PO) Take 2 tablets by mouth daily as needed  (congestion).   furosemide (LASIX) 80 MG tablet Take 1 tablet (80 mg total) by mouth 2 (two) times daily.   glipiZIDE (GLUCOTROL XL) 10 MG 24 hr tablet Take 1 tablet (10 mg total) by mouth daily with breakfast.   hydrALAZINE (APRESOLINE) 25 MG tablet TAKE 1/2 OF A TABLET (12.5 MG TOTAL) BY MOUTH 3 TIMES A DAY   isosorbide mononitrate (IMDUR) 30 MG 24 hr tablet Take 0.5 tablets (15 mg total) by mouth daily.   magnesium oxide (MAG-OX) 400 MG tablet TAKE 1 TABLET BY MOUTH EVERY DAY   metFORMIN (GLUCOPHAGE) 1000 MG tablet Take 1,000 mg by mouth 2 (two) times daily with a meal.   potassium chloride SA (KLOR-CON M) 20 MEQ tablet Take 2 tablets (40 mEq total) by mouth 2 (two) times daily.   sacubitril-valsartan (ENTRESTO) 24-26 MG Take 1 tablet by mouth 2 (two) times daily.   warfarin (JANTOVEN) 5 MG tablet TAKE 1 TO 1 AND 1/2 TABLETS BY MOUTH DAILY AS DIRECTED BY THE COUMADIN CLIN (Patient taking differently: Take 5 mg by mouth daily.)    Allergies  Allergen Reactions   Keflex [Cephalexin] Rash    Skin on feet peeled.   Asa [Aspirin]     325 mg ----heartburn Pt can take 81 mg   Lisinopril Cough      Review of Systems negative except from HPI and PMH  Physical Exam BP 118/66   Pulse 85   Ht 5\' 3"  (1.6 m)   Wt 179 lb (81.2 kg)   SpO2 99%   BMI 31.71 kg/m  Well developed and well nourished in no acute distress HENT normal Neck supple with JVP-8-9 Clear Device pocket well healed; without hematoma or erythema.  There is no tethering  Regular rate and rhythm, no  murmur Abd-soft with active BS No Clubbing cyanosis   edema Skin-warm and dry A & Oriented  Grossly normal sensory and motor function  ECG sinus  85 41/13/42  Device function is normal. Programming changes decrease output  See Paceart for details    Estimated Creatinine Clearance: 43.4 mL/min (A) (by C-G formula based on SCr of 1.27 mg/dL (H)).   Assessment and  Plan  Ischemic cardiomyopathy  Ventricular  tachycardia-sustained   Congestive heart failure-chronic-systolic class IIb-IIIa  Right bundle branch block 1AVB   Abnormal ECG with progressive conduction system disease   Atrial fibrillation-remote (last note that I can see is 6/18 shortly after her surgery)   Hypertension   Hyperkalemia precluding ACE/ARB/ASNI/MRA   PVCs  With cardiomyopathy: Continue carvedilol Apresoline/Imdur Entresto.  Unable to take spironolactone because of hyperkalemia.  Also on Farxiga.  No intercurrent ventricular tachycardia  Volume status is currently stable.  We will continue her current diuretics.  Marland Kitchen

## 2022-10-03 NOTE — Patient Instructions (Signed)
Medication Instructions:  Your physician recommends that you continue on your current medications as directed. Please refer to the Current Medication list given to you today.   *If you need a refill on your cardiac medications before your next appointment, please call your pharmacy*   Lab Work: None ordered.  If you have labs (blood work) drawn today and your tests are completely normal, you will receive your results only by: MyChart Message (if you have MyChart) OR A paper copy in the mail If you have any lab test that is abnormal or we need to change your treatment, we will call you to review the results.   Testing/Procedures: None ordered.    Follow-Up: At [facility], you and your health needs are our priority.  As part of our continuing mission to provide you with exceptional heart care, we have created designated Provider Care Teams.  These Care Teams include your primary Cardiologist (physician) and Advanced Practice Providers (APPs -  Physician Assistants and Nurse Practitioners) who all work together to provide you with the care you need, when you need it.  We recommend signing up for the patient portal called "MyChart".  Sign up information is provided on this After Visit Summary.  MyChart is used to connect with patients for Virtual Visits (Telemedicine).  Patients are able to view lab/test results, encounter notes, upcoming appointments, etc.  Non-urgent messages can be sent to your provider as well.   To learn more about what you can do with MyChart, go to [email or web address].    Your next appointment:   9 months with Dr Klein  Important Information About Sugar       

## 2022-10-04 ENCOUNTER — Ambulatory Visit (INDEPENDENT_AMBULATORY_CARE_PROVIDER_SITE_OTHER): Payer: No Typology Code available for payment source

## 2022-10-04 DIAGNOSIS — I255 Ischemic cardiomyopathy: Secondary | ICD-10-CM | POA: Diagnosis not present

## 2022-10-04 LAB — CUP PACEART REMOTE DEVICE CHECK
Battery Remaining Longevity: 180 mo
Battery Remaining Percentage: 100 %
Brady Statistic RV Percent Paced: 0 %
Date Time Interrogation Session: 20231101024000
HighPow Impedance: 62 Ohm
Implantable Lead Connection Status: 753985
Implantable Lead Implant Date: 20230728
Implantable Lead Location: 753860
Implantable Lead Model: 138
Implantable Lead Serial Number: 303552
Implantable Pulse Generator Implant Date: 20230728
Lead Channel Impedance Value: 523 Ohm
Lead Channel Pacing Threshold Amplitude: 0.6 V
Lead Channel Pacing Threshold Pulse Width: 0.4 ms
Lead Channel Setting Pacing Amplitude: 2.5 V
Lead Channel Setting Pacing Pulse Width: 0.4 ms
Lead Channel Setting Sensing Sensitivity: 0.6 mV
Pulse Gen Serial Number: 217674

## 2022-10-06 ENCOUNTER — Other Ambulatory Visit: Payer: Self-pay | Admitting: Cardiovascular Disease

## 2022-10-06 ENCOUNTER — Ambulatory Visit (HOSPITAL_COMMUNITY)
Admission: RE | Admit: 2022-10-06 | Discharge: 2022-10-06 | Disposition: A | Discharge status: Home / Self Care | Payer: No Typology Code available for payment source | Source: Ambulatory Visit | Attending: Cardiology | Admitting: Cardiology

## 2022-10-06 DIAGNOSIS — I5022 Chronic systolic (congestive) heart failure: Secondary | ICD-10-CM | POA: Diagnosis present

## 2022-10-06 DIAGNOSIS — Z7901 Long term (current) use of anticoagulants: Secondary | ICD-10-CM

## 2022-10-06 LAB — BASIC METABOLIC PANEL
Anion gap: 10 (ref 5–15)
BUN: 43 mg/dL — ABNORMAL HIGH (ref 8–23)
CO2: 27 mmol/L (ref 22–32)
Calcium: 9.7 mg/dL (ref 8.9–10.3)
Chloride: 104 mmol/L (ref 98–111)
Creatinine, Ser: 1.85 mg/dL — ABNORMAL HIGH (ref 0.44–1.00)
GFR, Estimated: 30 mL/min — ABNORMAL LOW (ref >60)
Glucose, Bld: 104 mg/dL — ABNORMAL HIGH (ref 70–99)
Potassium: 4.9 mmol/L (ref 3.5–5.1)
Sodium: 141 mmol/L (ref 135–145)

## 2022-10-06 NOTE — Telephone Encounter (Signed)
Please review for refill. Thank you! 

## 2022-10-06 NOTE — Telephone Encounter (Signed)
Prescription refill request received for warfarin Lov: 10/03/22  Caryl Comes)  Next INR check: 10/25/22 Warfarin tablet strength: 5mg   Appropriate dose and refill sent to requested pharmacy.

## 2022-10-17 NOTE — Progress Notes (Signed)
Remote ICD transmission.   

## 2022-10-23 ENCOUNTER — Ambulatory Visit (HOSPITAL_COMMUNITY)
Admission: RE | Admit: 2022-10-23 | Discharge: 2022-10-23 | Disposition: A | Discharge status: Home / Self Care | Payer: No Typology Code available for payment source | Source: Ambulatory Visit | Attending: Family Medicine | Admitting: Family Medicine

## 2022-10-23 ENCOUNTER — Encounter (HOSPITAL_COMMUNITY): Payer: Self-pay

## 2022-10-23 VITALS — BP 108/60 | HR 80 | Wt 181.2 lb

## 2022-10-23 DIAGNOSIS — I5022 Chronic systolic (congestive) heart failure: Secondary | ICD-10-CM | POA: Insufficient documentation

## 2022-10-23 DIAGNOSIS — I480 Paroxysmal atrial fibrillation: Secondary | ICD-10-CM

## 2022-10-23 DIAGNOSIS — I130 Hypertensive heart and chronic kidney disease with heart failure and stage 1 through stage 4 chronic kidney disease, or unspecified chronic kidney disease: Secondary | ICD-10-CM | POA: Insufficient documentation

## 2022-10-23 DIAGNOSIS — I25810 Atherosclerosis of coronary artery bypass graft(s) without angina pectoris: Secondary | ICD-10-CM | POA: Diagnosis present

## 2022-10-23 DIAGNOSIS — I493 Ventricular premature depolarization: Secondary | ICD-10-CM

## 2022-10-23 DIAGNOSIS — I472 Ventricular tachycardia, unspecified: Secondary | ICD-10-CM

## 2022-10-23 DIAGNOSIS — I052 Rheumatic mitral stenosis with insufficiency: Secondary | ICD-10-CM | POA: Diagnosis present

## 2022-10-23 DIAGNOSIS — Z952 Presence of prosthetic heart valve: Secondary | ICD-10-CM | POA: Diagnosis not present

## 2022-10-23 DIAGNOSIS — I2510 Atherosclerotic heart disease of native coronary artery without angina pectoris: Secondary | ICD-10-CM

## 2022-10-23 DIAGNOSIS — E119 Type 2 diabetes mellitus without complications: Secondary | ICD-10-CM

## 2022-10-23 DIAGNOSIS — E049 Nontoxic goiter, unspecified: Secondary | ICD-10-CM

## 2022-10-23 DIAGNOSIS — N183 Chronic kidney disease, stage 3 unspecified: Secondary | ICD-10-CM

## 2022-10-23 LAB — BASIC METABOLIC PANEL
Anion gap: 13 (ref 5–15)
BUN: 45 mg/dL — ABNORMAL HIGH (ref 8–23)
CO2: 24 mmol/L (ref 22–32)
Calcium: 9.4 mg/dL (ref 8.9–10.3)
Chloride: 104 mmol/L (ref 98–111)
Creatinine, Ser: 1.93 mg/dL — ABNORMAL HIGH (ref 0.44–1.00)
GFR, Estimated: 28 mL/min — ABNORMAL LOW (ref >60)
Glucose, Bld: 83 mg/dL (ref 70–99)
Potassium: 4.6 mmol/L (ref 3.5–5.1)
Sodium: 141 mmol/L (ref 135–145)

## 2022-10-23 LAB — BRAIN NATRIURETIC PEPTIDE: B Natriuretic Peptide: 477.9 pg/mL — ABNORMAL HIGH (ref 0.0–100.0)

## 2022-10-23 NOTE — Patient Instructions (Signed)
Thank you for coming in today  Labs were done today, if any labs are abnormal the clinic will call you No news is good news  Your physician recommends that you schedule a follow-up appointment in:  Keep follow up with Dr. Gala Romney    Do the following things EVERYDAY: Weigh yourself in the morning before breakfast. Write it down and keep it in a log. Take your medicines as prescribed Eat low salt foods--Limit salt (sodium) to 2000 mg per day.  Stay as active as you can everyday Limit all fluids for the day to less than 2 liters   At the [facility], you and your health needs are our priority. As part of our continuing mission to provide you with exceptional heart care, we have created designated Provider Care Teams. These Care Teams include your primary Cardiologist (physician) and Advanced Practice Providers (APPs- Physician Assistants and Nurse Practitioners) who all work together to provide you with the care you need, when you need it.   You may see any of the following providers on your designated Care Team at your next follow up: Dr Arvilla Meres Dr Marca Ancona Dr. Marcos Eke, NP Robbie Lis, [place] [facility] [place], [place] Brynda Peon, NP Karle Plumber, PharmD   Please be sure to bring in all your medications bottles to every appointment.   If you have any questions or concerns before your next appointment please send [place] a message through [place] or call our office at [phone].    TO LEAVE A MESSAGE FOR THE NURSE SELECT OPTION 2, PLEASE LEAVE A MESSAGE INCLUDING: YOUR NAME DATE OF BIRTH CALL BACK NUMBER REASON FOR CALL**this is important as we prioritize the call backs  YOU WILL RECEIVE A CALL BACK THE SAME DAY AS LONG AS YOU CALL BEFORE 4:00 PM

## 2022-10-23 NOTE — Progress Notes (Addendum)
ReDS Vest / Clip - 10/23/22 1555       ReDS Vest / Clip   Station Marker A    Ruler Value 21    ReDS Value Range High volume overload    ReDS Actual Value 59    Anatomical Comments sitting

## 2022-10-23 NOTE — Progress Notes (Addendum)
Advanced Heart Failure Clinic Note   PCP: Deatra James, MD Primary Cardiologist: Dr. Duke Salvia HF Cardiologist: Dr. 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   She was previously followed by Dr. Gala Romney in Advanced Heart Failure clinic, graduated from clinic in 05/2019. Echo 05/2019: EF 35-40%. Has been followed by Dr. Duke Salvia with Cardiology.  Echo 01/22: EF 30-35%  Echo 01/23: EF 25-30%, RV moderately reduced, RVSP 38.8 mmHg, trivial MR, mean gradient 7 mmHg across mitral valve prosthesis, IVC dilated with estimated RAP 15 mmHg  Saw EP in 03/23. D/t presence of progressive conduction system disease workup for amyloidosis recommended. Unable to tolerate cMRI d/t claustrophobia.   7 day Zio 04/23: SR with avg rate 83 bpm, 1st degree AVB and intermittent Wenckebach AV block noted. 2.3% PVCs. 6 runs VT, longest 27 min and 48 seconds. ICD recommended. She wanted to discuss with family before proceeding.  Admitted 05/23 with a/c CHF. Diuresed with IV lasix. Discharged on furosemide 80 mg daily. GDMT - coreg 3.125 mg BID and dapagliflozin 10 mg daily.   Seen in [facility] clinic 04/17/22. Added back Entresto, later stopped when she developed hyperkalemia. Felt better when she was on Entresto in the past. LifeVest placed dt/ episode sustained VT on monitor in April.  PYP scan 04/20/22 equivocal for TTR amyloidosis.  R/LHC 04/20/22: LIMA to LAD and SVG to OM patent, occluded SVG to RCA and SVG to Ramus, normal LVEDP, moderate mixed PAH, severe mitral stenosis (felt to be overestimated). Med management of CAD recommended, if has refractory angina could consider CTO RCA.  S/p [employer] SCI ICD implant 7/23.   Seen in ED 09/14/22 with SOB. Given IV lasix with improvement of symptoms.  Follow up 10/23, NYHA II and volume overloaded, ReDs 40%.  Today she  returns for HF follow up. Overall feeling better. She does not have SOB with walking on flat ground or ADLs. Walks outside with her grandchildren for exercise. Denies palpitations, abnormal bleeding, CP, dizziness, edema, or PND/Orthopnea. Appetite ok. No fever or chills. Weight at home 180 pounds. Taking all medications. 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   Cardiac Studies - R/LHC (5/23):  1. Severe 3v CAD 2. LIMA to LAD and SVG to OM-1 widely patent 3. SVG to RCA and SVG to Ramus occluded 4. LVEF 25% 5. Normal LVEDP 6. Moderate mixed PAH 7. Apparent severe mitral stenosis (suspect overestimated based on echo)  Ao = 126/65 (90)  LV = 114/13 RA =  10 RV = 52/9 PA = 50/18 (33) PCW = 22 (v = 30) Fick cardiac output/index = 4.6/2.6 PVR = 2.4 WU FA sat = 94% PA sat = 60%, 63% MV gradient on LV-wedge tracing = mean 13.0 MVA 0.93cm2 PAPi = 3.2  - PYP (5/23): equivocal for TTR amyloidosis  - Zio (4/23): SR with avg rate 83 bpm, 1st degree AVB and intermittent Wenckebach AV block noted. 2.3% PVCs. 6 runs VT, longest 27 min and 48 seconds.   - Echo (1/23): EF 25-30%, RV moderately reduced, RVSP 38.8 mmHg, trivial MR, mean gradient 7 mmHg across mitral valve prosthesis, IVC dilated with estimated RAP 15 mmHg  - Echo (1/22): EF 30-35%  - Echo (6/20): EF 35-40%.   Review of Systems: Cardiac and respiratory. Negative except as mentioned in HPI.  Past Medical History:  Diagnosis Date  Arrhythmia    Hx PAF, PVC's   CHF (congestive heart failure) (HCC)    Coronary artery disease    04/04/2017 STEMI   Diabetes [facility])    H/O mitral valve replacement 09/21/2021   Heart attack (HCC)    per pt reprot   Hyperlipidemia    Hypertension    New onset atrial fibrillation (HCC) 05/2017   Current Outpatient Medications  Medication Sig Dispense Refill   acetaminophen (TYLENOL) 325 MG tablet Take 2 tablets (650 mg total) by mouth every 6 (six) hours as needed for mild pain or  headache.     aspirin EC 81 MG tablet Take 81 mg by mouth daily.     atorvastatin (LIPITOR) 40 MG tablet TAKE 1 TABLET BY MOUTH DAILY AT 6 PM. 90 tablet 0   carvedilol (COREG) 3.125 MG tablet Take 2 tablets (6.25 mg total) by mouth 2 (two) times daily. 180 tablet 3   dapagliflozin propanediol (FARXIGA) 10 MG TABS tablet Take 10 mg by mouth daily.     DM-APAP-CPM (CORICIDIN HBP PO) Take 2 tablets by mouth daily as needed (congestion).     furosemide (LASIX) 80 MG tablet Take 1 tablet (80 mg total) by mouth 2 (two) times daily. 60 tablet 11   glipiZIDE (GLUCOTROL XL) 10 MG 24 hr tablet Take 1 tablet (10 mg total) by mouth daily with breakfast.     hydrALAZINE (APRESOLINE) 25 MG tablet TAKE 1/2 OF A TABLET (12.5 MG TOTAL) BY MOUTH 3 TIMES A DAY 135 tablet 1   isosorbide mononitrate (IMDUR) 30 MG 24 hr tablet Take 0.5 tablets (15 mg total) by mouth daily. 45 tablet 3   metFORMIN (GLUCOPHAGE) 1000 MG tablet Take 1,000 mg by mouth 2 (two) times daily with a meal.     potassium chloride SA (KLOR-CON M) 20 MEQ tablet Take 2 tablets (40 mEq total) by mouth 2 (two) times daily. 120 tablet 8   sacubitril-valsartan (ENTRESTO) 24-26 MG Take 1 tablet by mouth 2 (two) times daily. 60 tablet 3   warfarin (COUMADIN) 5 MG tablet TAKE 1 TABLET BY MOUTH DAILY OR AS DIRECTED BY COUMADIN CLINIC 100 tablet 0   No current facility-administered medications for this encounter.   Allergies  Allergen Reactions   Keflex [Cephalexin] Rash    Skin on feet peeled.   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  Tobacco Use   Smoking status: Never   Smokeless tobacco: Never  Vaping Use    Vaping Use: Never used  Substance and Sexual Activity   Alcohol use: No   Drug use: Yes    Types: Marijuana    Comment: In her 22 's   Sexual activity: Not on file  Other Topics Concern   Not on file  Social History Narrative   Not on file   Social Determinants of Health   Financial Resource Strain: Not on file  Food Insecurity: No Food Insecurity (04/06/2022)   Hunger Vital Sign    Worried About Running Out of Food in the Last Year: Never true    Ran Out of  Food in the Last Year: Never true  Transportation Needs: No Transportation Needs (04/06/2022)   PRAPARE - Administrator, Civil Service (Medical): No    Lack of Transportation (Non-Medical): No  Physical Activity: Not on file  Stress: Not on file  Social Connections: Not on file  Intimate Partner Violence: Not on file   Family History  Problem Relation Age of Onset   Heart attack Father 42   Breast cancer Maternal Grandmother    BP 108/60   Pulse 80   Wt 82.2 kg (181 lb 3.2 oz)   SpO2 100%   BMI 32.10 kg/m   Wt Readings from Last 3 Encounters:  10/23/22 82.2 kg (181 lb 3.2 oz)  10/03/22 81.2 kg (179 lb)  09/29/22 82.5 kg (181 lb 12.8 oz)   PHYSICAL EXAM: General:  NAD. No resp difficulty HEENT: Normal Neck: Supple. No JVD. Carotids 2+ bilat; no bruits. No lymphadenopathy or thryomegaly appreciated. Cor: PMI nondisplaced. Regular rate & rhythm. No rubs, gallops or murmurs. Lungs: Clear Abdomen: Soft, nontender, nondistended. No hepatosplenomegaly. No bruits or masses. Good bowel sounds. Extremities: No cyanosis, clubbing, rash, edema Neuro: Alert & oriented x 3, cranial nerves grossly intact. Moves all 4 extremities w/o difficulty. Affect pleasant.  REDs: 59%  ASSESSMENT & PLAN: Chronic Systolic Heart Failure/ICM - Echo 02/06/2018 LVEF 35-40% - Echo 6/20 EF 35-40% - Echo 1/22: EF 30-35% - Echo 1/23 EF 25-30% (appears closer to 20% on review with Dr. Gala Romney) - Sustained VT on zio 04/23. Wore  LifeVest while deciding on ICD. - R/LHC 04/20/22: LIMA to LAD and SVG to OM patent, occluded SVG to RCA and SVG to Ramus, normal LVEDP, moderate mixed PAH, severe mitral stenosis (felt to be overestimated). Med management of CAD recommended, if has refractory angina could consider CTO RCA. - Unable to tolerate MRI d/t claustrophobia. PYP scan from 05/23 negative. - s/p [employer] SCI ICD 7/23 - NYHA I- II currently. Volume looks good on exam, ReDs 59% (does not match with clinical picture today). - Continue Lasix 80 mg bid and 40 KCL daily. - Continue Coreg 6.25 mg bid. Follow carefully with baseline conduction system disease.  - Continue Farxiga 10 mg daily. - Continue Entresto 24/26 mg bid. - Continue hydralazine 12.5 mg tid + Imdur 15 mg daily. - Off spiro d/t hyperkalemia.  - Labs today.    2. CAD S/P CABG x4: - LHC 04/20/22: LIMA to LAD and SVG to OM patent, occluded SVG to RCA and SVG to Ramus - Managing CAD medically as above. - No recent angina. - Continue statin and ASA.  3. Bioprosthetic MVR:  - Echo 1/23: EF 25-30%, trivial MR, mild mitral stenosis with mean gradient 7 mmHg  - RHC 5/23: MV gradient on LV wedge tracing - mean 13, MVA 0.93 cm2, suggestive of severe MS (likely overestimated when compared to echo) - Will need to follow. - Continue warfarin and ASA. Denies bleeding. - Reminded about SBE prophylaxis   4. VT/PVCs:  - 7 day Zio, 04/23: 2.3% PVCs, sustained VT (longest 27 minutes) - s/p ICD implant 06/30/22. Unable to interrogate device in clinic today.  5. PAF - Regular on exam. - Continue warfarin. Denies bleeding  6. DM2 - Hgb A1c 6.7 in 5/23.  7. CKD 3 - Baseline SCr 1.4-1.6 - BMET today.  8. Goiter - Thyroid nodule biopsied 10/20, benign follicular nodule - TSH normal 5/23 - Overdue for follow-up with Dr. Lafe Garin.    Follow up in 3  months with Dr. Gala Romney, as scheduled.  Jacklynn Ganong, FNP  3:54 PM

## 2022-10-30 ENCOUNTER — Ambulatory Visit: Payer: No Typology Code available for payment source | Attending: Cardiology | Admitting: *Deleted

## 2022-10-30 DIAGNOSIS — Z951 Presence of aortocoronary bypass graft: Secondary | ICD-10-CM

## 2022-10-30 DIAGNOSIS — Z7901 Long term (current) use of anticoagulants: Secondary | ICD-10-CM | POA: Diagnosis not present

## 2022-10-30 DIAGNOSIS — I480 Paroxysmal atrial fibrillation: Secondary | ICD-10-CM

## 2022-10-30 LAB — POCT INR: INR: 3.1 — AB (ref 2.0–3.0)

## 2022-10-30 NOTE — Patient Instructions (Signed)
Description   Today take 1/2 tablet (2.5mg ) then continue taking Warfarin 1 tablet (5mg ) daily. Recheck INR in 4 weeks. Coumadin Clinic  [phone]

## 2022-11-20 ENCOUNTER — Other Ambulatory Visit (HOSPITAL_COMMUNITY): Payer: Self-pay | Admitting: Family Medicine

## 2022-11-30 ENCOUNTER — Ambulatory Visit: Payer: No Typology Code available for payment source

## 2022-12-01 ENCOUNTER — Other Ambulatory Visit (HOSPITAL_COMMUNITY): Payer: Self-pay | Admitting: Internal Medicine

## 2022-12-04 ENCOUNTER — Other Ambulatory Visit: Payer: Self-pay | Admitting: Cardiovascular Disease

## 2022-12-05 NOTE — Telephone Encounter (Signed)
Rx request sent to pharmacy.  

## 2022-12-11 ENCOUNTER — Ambulatory Visit: Payer: No Typology Code available for payment source | Attending: Cardiology

## 2022-12-11 DIAGNOSIS — Z5181 Encounter for therapeutic drug level monitoring: Secondary | ICD-10-CM | POA: Diagnosis not present

## 2022-12-11 DIAGNOSIS — Z951 Presence of aortocoronary bypass graft: Secondary | ICD-10-CM | POA: Diagnosis not present

## 2022-12-11 DIAGNOSIS — Z7901 Long term (current) use of anticoagulants: Secondary | ICD-10-CM

## 2022-12-11 DIAGNOSIS — I480 Paroxysmal atrial fibrillation: Secondary | ICD-10-CM

## 2022-12-11 LAB — POCT INR: INR: 1.6 — AB (ref 2.0–3.0)

## 2022-12-11 NOTE — Patient Instructions (Signed)
Today take 2 tablets  then continue taking Warfarin 1 tablet (5mg ) daily. Recheck INR in 3 weeks. Coumadin Clinic  [phone]

## 2022-12-27 ENCOUNTER — Other Ambulatory Visit: Payer: Self-pay | Admitting: Internal Medicine

## 2022-12-27 DIAGNOSIS — Z7901 Long term (current) use of anticoagulants: Secondary | ICD-10-CM

## 2022-12-27 NOTE — Telephone Encounter (Signed)
Warfarin refill Last INR 12/11/22 Last OV 10/23/22

## 2023-01-01 ENCOUNTER — Ambulatory Visit: Payer: No Typology Code available for payment source | Attending: Cardiovascular Disease

## 2023-01-01 DIAGNOSIS — Z7901 Long term (current) use of anticoagulants: Secondary | ICD-10-CM | POA: Diagnosis not present

## 2023-01-01 DIAGNOSIS — Z951 Presence of aortocoronary bypass graft: Secondary | ICD-10-CM | POA: Diagnosis not present

## 2023-01-01 LAB — POCT INR: INR: 2.6 (ref 2.0–3.0)

## 2023-01-01 NOTE — Patient Instructions (Signed)
Description   Continue taking Warfarin 1 tablet (5mg ) daily.  Recheck INR in 4 weeks.  Coumadin Clinic  [phone]

## 2023-01-03 ENCOUNTER — Ambulatory Visit: Payer: No Typology Code available for payment source

## 2023-01-03 DIAGNOSIS — I440 Atrioventricular block, first degree: Secondary | ICD-10-CM | POA: Diagnosis not present

## 2023-01-03 LAB — CUP PACEART REMOTE DEVICE CHECK
Battery Remaining Longevity: 174 mo
Battery Remaining Percentage: 100 %
Brady Statistic RV Percent Paced: 0 %
Date Time Interrogation Session: 20240131020100
HighPow Impedance: 69 Ohm
Implantable Lead Connection Status: 753985
Implantable Lead Implant Date: 20230728
Implantable Lead Location: 753860
Implantable Lead Model: 138
Implantable Lead Serial Number: 303552
Implantable Pulse Generator Implant Date: 20230728
Lead Channel Impedance Value: 503 Ohm
Lead Channel Pacing Threshold Amplitude: 0.7 V
Lead Channel Pacing Threshold Pulse Width: 0.4 ms
Lead Channel Setting Pacing Amplitude: 2.5 V
Lead Channel Setting Pacing Pulse Width: 0.4 ms
Lead Channel Setting Sensing Sensitivity: 0.6 mV
Pulse Gen Serial Number: 217674

## 2023-01-26 NOTE — Progress Notes (Signed)
Remote ICD transmission.   

## 2023-01-29 ENCOUNTER — Ambulatory Visit: Payer: No Typology Code available for payment source | Attending: Cardiology

## 2023-01-29 DIAGNOSIS — Z7901 Long term (current) use of anticoagulants: Secondary | ICD-10-CM

## 2023-01-29 DIAGNOSIS — Z951 Presence of aortocoronary bypass graft: Secondary | ICD-10-CM

## 2023-01-29 DIAGNOSIS — I480 Paroxysmal atrial fibrillation: Secondary | ICD-10-CM | POA: Diagnosis not present

## 2023-01-29 LAB — POCT INR: INR: 2.6 (ref 2.0–3.0)

## 2023-01-29 NOTE — Patient Instructions (Signed)
Description   Continue taking Warfarin 1 tablet ('5mg'$ ) daily.  Recheck INR in 5 weeks.  Coumadin Clinic  [phone]

## 2023-02-12 ENCOUNTER — Other Ambulatory Visit (HOSPITAL_COMMUNITY): Payer: Self-pay | Admitting: Physician Assistant

## 2023-02-20 ENCOUNTER — Ambulatory Visit (HOSPITAL_BASED_OUTPATIENT_CLINIC_OR_DEPARTMENT_OTHER): Payer: No Typology Code available for payment source | Admitting: Cardiovascular Disease

## 2023-02-26 ENCOUNTER — Other Ambulatory Visit (HOSPITAL_COMMUNITY): Payer: Self-pay | Admitting: Physician Assistant

## 2023-03-05 ENCOUNTER — Ambulatory Visit: Payer: No Typology Code available for payment source | Attending: Internal Medicine | Admitting: Pharmacist

## 2023-03-05 DIAGNOSIS — Z7901 Long term (current) use of anticoagulants: Secondary | ICD-10-CM | POA: Diagnosis not present

## 2023-03-05 DIAGNOSIS — I480 Paroxysmal atrial fibrillation: Secondary | ICD-10-CM | POA: Diagnosis not present

## 2023-03-05 DIAGNOSIS — Z951 Presence of aortocoronary bypass graft: Secondary | ICD-10-CM | POA: Diagnosis not present

## 2023-03-05 LAB — POCT INR: POC INR: 1.3

## 2023-03-05 NOTE — Patient Instructions (Signed)
Take 1.5 tablets today and tomorrow then continue taking Warfarin 1 tablet (5mg ) daily.  Recheck INR in 3 weeks.  Coumadin Clinic  [phone]

## 2023-03-15 LAB — LAB REPORT - SCANNED: EGFR: 19

## 2023-03-23 ENCOUNTER — Ambulatory Visit: Payer: No Typology Code available for payment source

## 2023-03-23 ENCOUNTER — Ambulatory Visit
Admission: RE | Admit: 2023-03-23 | Discharge: 2023-03-23 | Disposition: A | Discharge status: Home / Self Care | Payer: No Typology Code available for payment source | Source: Ambulatory Visit | Attending: Internal Medicine | Admitting: Internal Medicine

## 2023-03-23 ENCOUNTER — Encounter (HOSPITAL_COMMUNITY): Payer: Self-pay | Admitting: Internal Medicine

## 2023-03-23 ENCOUNTER — Ambulatory Visit (INDEPENDENT_AMBULATORY_CARE_PROVIDER_SITE_OTHER): Payer: No Typology Code available for payment source | Admitting: Cardiology

## 2023-03-23 VITALS — BP 108/64 | HR 85 | Wt 183.6 lb

## 2023-03-23 DIAGNOSIS — Z5181 Encounter for therapeutic drug level monitoring: Secondary | ICD-10-CM | POA: Diagnosis not present

## 2023-03-23 DIAGNOSIS — E875 Hyperkalemia: Secondary | ICD-10-CM | POA: Insufficient documentation

## 2023-03-23 DIAGNOSIS — I440 Atrioventricular block, first degree: Secondary | ICD-10-CM

## 2023-03-23 DIAGNOSIS — Z7901 Long term (current) use of anticoagulants: Secondary | ICD-10-CM | POA: Insufficient documentation

## 2023-03-23 DIAGNOSIS — I480 Paroxysmal atrial fibrillation: Secondary | ICD-10-CM | POA: Diagnosis not present

## 2023-03-23 DIAGNOSIS — I5022 Chronic systolic (congestive) heart failure: Secondary | ICD-10-CM | POA: Diagnosis present

## 2023-03-23 DIAGNOSIS — Z952 Presence of prosthetic heart valve: Secondary | ICD-10-CM

## 2023-03-23 DIAGNOSIS — Z79899 Other long term (current) drug therapy: Secondary | ICD-10-CM | POA: Insufficient documentation

## 2023-03-23 DIAGNOSIS — I130 Hypertensive heart and chronic kidney disease with heart failure and stage 1 through stage 4 chronic kidney disease, or unspecified chronic kidney disease: Secondary | ICD-10-CM | POA: Diagnosis present

## 2023-03-23 DIAGNOSIS — I4510 Unspecified right bundle-branch block: Secondary | ICD-10-CM | POA: Insufficient documentation

## 2023-03-23 DIAGNOSIS — I2510 Atherosclerotic heart disease of native coronary artery without angina pectoris: Secondary | ICD-10-CM | POA: Diagnosis not present

## 2023-03-23 DIAGNOSIS — I441 Atrioventricular block, second degree: Secondary | ICD-10-CM | POA: Diagnosis not present

## 2023-03-23 DIAGNOSIS — Z95810 Presence of automatic (implantable) cardiac defibrillator: Secondary | ICD-10-CM | POA: Diagnosis not present

## 2023-03-23 DIAGNOSIS — I052 Rheumatic mitral stenosis with insufficiency: Secondary | ICD-10-CM | POA: Insufficient documentation

## 2023-03-23 DIAGNOSIS — E1122 Type 2 diabetes mellitus with diabetic chronic kidney disease: Secondary | ICD-10-CM | POA: Diagnosis not present

## 2023-03-23 DIAGNOSIS — E119 Type 2 diabetes mellitus without complications: Secondary | ICD-10-CM | POA: Diagnosis not present

## 2023-03-23 DIAGNOSIS — Z953 Presence of xenogenic heart valve: Secondary | ICD-10-CM | POA: Diagnosis not present

## 2023-03-23 DIAGNOSIS — I25810 Atherosclerosis of coronary artery bypass graft(s) without angina pectoris: Secondary | ICD-10-CM | POA: Insufficient documentation

## 2023-03-23 DIAGNOSIS — E041 Nontoxic single thyroid nodule: Secondary | ICD-10-CM | POA: Insufficient documentation

## 2023-03-23 DIAGNOSIS — I472 Ventricular tachycardia, unspecified: Secondary | ICD-10-CM | POA: Insufficient documentation

## 2023-03-23 LAB — BASIC METABOLIC PANEL
Anion gap: 12 (ref 5–15)
BUN: 29 mg/dL — ABNORMAL HIGH (ref 8–23)
CO2: 25 mmol/L (ref 22–32)
Calcium: 9.2 mg/dL (ref 8.9–10.3)
Chloride: 103 mmol/L (ref 98–111)
Creatinine, Ser: 1.6 mg/dL — ABNORMAL HIGH (ref 0.44–1.00)
GFR, Estimated: 35 mL/min — ABNORMAL LOW (ref >60)
Glucose, Bld: 100 mg/dL — ABNORMAL HIGH (ref 70–99)
Potassium: 4.3 mmol/L (ref 3.5–5.1)
Sodium: 140 mmol/L (ref 135–145)

## 2023-03-23 LAB — T4, FREE: Free T4: 1.1 ng/dL (ref 0.61–1.12)

## 2023-03-23 LAB — BRAIN NATRIURETIC PEPTIDE: B Natriuretic Peptide: 343.3 pg/mL — ABNORMAL HIGH (ref 0.0–100.0)

## 2023-03-23 LAB — PROTIME-INR
INR: 2.4 — ABNORMAL HIGH (ref 0.8–1.2)
Prothrombin Time: 26.1 seconds — ABNORMAL HIGH (ref 11.4–15.2)

## 2023-03-23 LAB — TSH: TSH: 0.436 u[IU]/mL (ref 0.350–4.500)

## 2023-03-23 NOTE — Addendum Note (Signed)
Encounter addended by: Linda Hedges, RN on: 03/23/2023 11:20 AM  Actions taken: Order list changed, Diagnosis association updated, Clinical Note Signed, Charge Capture section accepted

## 2023-03-23 NOTE — Progress Notes (Signed)
Advanced Heart Failure Clinic Note   PCP: Deatra James, MD Primary Cardiologist: Dr. Duke Salvia HF Cardiologist: Dr. 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   She was previously followed by Dr. Gala Romney in Advanced Heart Failure clinic, graduated from clinic in 05/2019. Echo 05/2019: EF 35-40%. Has been followed by Dr. Duke Salvia with Cardiology.  Echo 01/22: EF 30-35%  Echo 01/23: EF 25-30%, RV moderately reduced, RVSP 38.8 mmHg, trivial MR, mean gradient 7 mmHg across mitral valve prosthesis, IVC dilated with estimated RAP 15 mmHg  Saw EP in 03/23. D/t presence of progressive conduction system disease workup for amyloidosis recommended. Unable to tolerate cMRI d/t claustrophobia.   7 day Zio 04/23: SR with avg rate 83 bpm, 1st degree AVB and intermittent Wenckebach AV block noted. 2.3% PVCs. 6 runs VT, longest 27 min and 48 seconds. ICD recommended. She wanted to discuss with family before proceeding.  Admitted 05/23 with a/c CHF. Diuresed with IV lasix. Discharged on furosemide 80 mg daily. GDMT - coreg 3.125 mg BID and dapagliflozin 10 mg daily.   Seen in [facility] clinic 04/17/22. Added back Entresto, later stopped when she developed hyperkalemia. Felt better when she was on Entresto in the past. LifeVest placed dt/ episode sustained VT on monitor in April.  PYP scan 04/20/22 equivocal for TTR amyloidosis.  R/LHC 04/20/22: LIMA to LAD and SVG to OM patent, occluded SVG to RCA and SVG to Ramus, normal LVEDP, moderate mixed PAH, severe mitral stenosis (felt to be overestimated). Med management of CAD recommended, if has refractory angina could consider CTO RCA.  S/p [employer] SCI ICD implant 7/23.   Seen in ED 09/14/22 with SOB. Given IV lasix with improvement of symptoms.  Follow up 10/23, NYHA II and volume overloaded, ReDs 40%.  Today she  returns for HF follow up. Feels good. No problems with ADLs and going to store. 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 Mild DOE especially in heat. No CP. Had some fluid last week. Took extra lasix and now better.    Cardiac Studies - R/LHC (5/23):  1. Severe 3v CAD 2. LIMA to LAD and SVG to OM-1 widely patent 3. SVG to RCA and SVG to Ramus occluded 4. LVEF 25% 5. Normal LVEDP 6. Moderate mixed PAH 7. Apparent severe mitral stenosis (suspect overestimated based on echo)  Ao = 126/65 (90)  LV = 114/13 RA =  10 RV = 52/9 PA = 50/18 (33) PCW = 22 (v = 30) Fick cardiac output/index = 4.6/2.6 PVR = 2.4 WU FA sat = 94% PA sat = 60%, 63% MV gradient on LV-wedge tracing = mean 13.0 MVA 0.93cm2 PAPi = 3.2  - PYP (5/23): equivocal for TTR amyloidosis (Janessa Mickle read: completely negative)  - Zio (4/23): SR with avg rate 83 bpm, 1st degree AVB and intermittent Wenckebach AV block noted. 2.3% PVCs. 6 runs VT, longest 27 min and 48 seconds.   - Echo (1/23): EF 25-30%, RV moderately reduced, RVSP 38.8 mmHg, trivial MR, mean gradient 7 mmHg across mitral valve prosthesis, IVC dilated with estimated RAP 15 mmHg  - Echo (1/22): EF 30-35%  - Echo (6/20): EF 35-40%.   Review of Systems: Cardiac and respiratory. Negative except as mentioned in HPI.  Past Medical History:  Diagnosis Date   Arrhythmia    Hx PAF, PVC's   CHF (congestive  heart failure)    Coronary artery disease    04/04/2017 STEMI   Diabetes    H/O mitral valve replacement 09/21/2021   Heart attack    per pt reprot   Hyperlipidemia    Hypertension    New onset atrial fibrillation 05/2017   Current Outpatient Medications  Medication Sig Dispense Refill   acetaminophen (TYLENOL) 325 MG tablet Take 2 tablets (650 mg total) by mouth every 6 (six) hours as needed for mild pain or headache.     aspirin EC 81 MG tablet Take 81 mg by mouth daily.     atorvastatin (LIPITOR) 40 MG tablet TAKE 1 TABLET BY MOUTH  DAILY AT 6 PM. 90 tablet 0   carvedilol (COREG) 3.125 MG tablet TAKE 1 TABLET BY MOUTH TWICE A DAY 180 tablet 2   dapagliflozin propanediol (FARXIGA) 10 MG TABS tablet Take 10 mg by mouth daily.     DM-APAP-CPM (CORICIDIN HBP PO) Take 2 tablets by mouth daily as needed (congestion).     ENTRESTO 24-26 MG TAKE 1 TABLET BY MOUTH TWICE A DAY 60 tablet 3   furosemide (LASIX) 80 MG tablet TAKE 1 TABLET BY MOUTH EVERY DAY 90 tablet 1   glipiZIDE (GLUCOTROL XL) 10 MG 24 hr tablet Take 1 tablet (10 mg total) by mouth daily with breakfast.     hydrALAZINE (APRESOLINE) 25 MG tablet TAKE 1/2 OF A TABLET (12.5 MG TOTAL) BY MOUTH 3 TIMES A DAY 135 tablet 1   isosorbide mononitrate (IMDUR) 30 MG 24 hr tablet TAKE 1/2 OF A TABLET (15 MG TOTAL) BY MOUTH DAILY 45 tablet 3   metFORMIN (GLUCOPHAGE) 1000 MG tablet Take 1,000 mg by mouth 2 (two) times daily with a meal.     potassium chloride SA (KLOR-CON M) 20 MEQ tablet Take 40 mEq by mouth daily.     warfarin (COUMADIN) 5 MG tablet TAKE 1 TABLET BY MOUTH DAILY OR AS DIRECTED BY COUMADIN CLINIC 100 tablet 0   No current facility-administered medications for this encounter.   Allergies  Allergen Reactions   Keflex [Cephalexin] Rash    Skin on feet peeled.   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  Tobacco Use   Smoking status: Never   Smokeless tobacco: Never  Vaping Use   Vaping Use: Never used  Substance and Sexual Activity   Alcohol use: No   Drug use: Yes    Types: Marijuana    Comment: In her 52 's   Sexual activity: Not on file  Other Topics Concern   Not on file  Social History Narrative   Not on file    Social Determinants of Health   Financial Resource Strain: Not on file  Food Insecurity: No Food Insecurity (04/06/2022)   Hunger Vital Sign    Worried About Running Out of Food in the Last Year: Never true    Ran Out of Food in the Last Year: Never true  Transportation Needs: No Transportation Needs (04/06/2022)   PRAPARE - Transportation    Lack of Transportation (Medical): No    Lack  of Transportation (Non-Medical): No  Physical Activity: Not on file  Stress: Not on file  Social Connections: Not on file  Intimate Partner Violence: Not on file   Family History  Problem Relation Age of Onset   Heart attack Father 79   Breast cancer Maternal Grandmother    BP 108/64   Pulse 85   Wt 83.3 kg (183 lb 9.6 oz)   SpO2 98%   BMI 32.52 kg/m   Wt Readings from Last 3 Encounters:  03/23/23 83.3 kg (183 lb 9.6 oz)  10/23/22 82.2 kg (181 lb 3.2 oz)  10/03/22 81.2 kg (179 lb)   PHYSICAL EXAM: General:  Well appearing. No resp difficulty HEENT: normal Neck: supple. no JVD. + goiter Carotids 2+ bilat; no bruits. No lymphadenopathy or thryomegaly appreciated. Cor: PMI nondisplaced. Regular rate & rhythm. No rubs, gallops or murmurs. Lungs: clear Abdomen: soft, nontender, nondistended. No hepatosplenomegaly. No bruits or masses. Good bowel sounds. Extremities: no cyanosis, clubbing, rash, 1+ edema Neuro: alert & orientedx3, cranial nerves grossly intact. moves all 4 extremities w/o difficulty. Affect pleasant  ICD interrogation: HL score 27 Impedance ok activity level 0.8h/day Personally reviewed  ECG NSR 84 LVH/RBBB 1AVB Personally reviewed   ASSESSMENT & PLAN: Chronic Systolic Heart Failure/ICM - Echo 02/06/2018 LVEF 35-40% - Echo 6/20 EF 35-40% - Echo 1/22: EF 30-35% - Echo 1/23 EF 25-30% (appears closer to 20% on review with Dr. Gala Romney) - Sustained VT on zio 04/23. Wore LifeVest while deciding on ICD. - R/LHC 04/20/22: LIMA to LAD and SVG to OM patent, occluded SVG to  RCA and SVG to Ramus, normal LVEDP, moderate mixed PAH, severe mitral stenosis (felt to be overestimated). Med management of CAD recommended, if has refractory angina could consider CTO RCA. - Unable to tolerate MRI d/t claustrophobia. PYP scan from 05/23 negative. - s/p [employer] SCI ICD 7/23 - NYHA II. Volume up after she cut back lasix from 80 bid to 80 daily due to work.  - Continue Lasix 80 mg daily  and 40 KCL daily. Will have her take lasix 80 bid on weekends - Continue Coreg 6.25 mg bid. Follow carefully with baseline conduction system disease.  - Continue Farxiga 10 mg daily. - Continue Entresto 24/26 mg bid. - Continue hydralazine 12.5 mg tid + Imdur 15 mg daily. - Off spiro d/t hyperkalemia.  - Labs today   2. CAD S/P CABG x4: - LHC 04/20/22: LIMA to LAD and SVG to OM patent, occluded SVG to RCA and SVG to Ramus - Managing CAD medically as above. - No recent angina. - Continue statin and ASA.  3. Bioprosthetic MVR:  - Echo 1/23: EF 25-30%, trivial MR, mild mitral stenosis with mean gradient 7 mmHg  - RHC 5/23: MV gradient on LV wedge tracing - mean 13, MVA 0.93 cm2, suggestive of severe MS (likely overestimated when compared to echo) - Will repeat echo  - Continue warfarin and ASA. Denies bleeding. - Reminded about SBE prophylaxis   4. VT/PVCs:  - 7 day Zio, 04/23: 2.3% PVCs, sustained VT (longest 27 minutes) - s/p ICD implant 06/30/22. Unable to interrogate device in clinic today.  5. PAF - Regular on exam. - Continue warfarin. Denies bleeding - Check INR  6. DM2 - Per PCP  7. CKD 3 - Baseline SCr 1.4-1.6 - BMET today. - Continue Farxiga   8. Goiter - Thyroid nodule biopsied 10/20, benign follicular nodule - TSH normal 5/23 - Overdue for follow-up with Dr. Lafe Garin.   -  Will check thyroid labs today   Arvilla Meres, MD  11:09 AM

## 2023-03-23 NOTE — Patient Instructions (Signed)
INCREASE Lasix to 80 mg Twice daily on the weekends.  Labs done today, your results will be available in MyChart, we will contact you for abnormal readings.  Your physician has requested that you have an echocardiogram. Echocardiography is a painless test that uses sound waves to create images of your heart. It provides your doctor with information about the size and shape of your heart and how well your heart's chambers and valves are working. This procedure takes approximately one hour. There are no restrictions for this procedure. Please do NOT wear cologne, perfume, aftershave, or lotions (deodorant is allowed). Please arrive 15 minutes prior to your appointment time.  Your physician recommends that you schedule a follow-up appointment in: 4 months   If you have any questions or concerns before your next appointment please send [place] a message through [place] or call our office at [phone].    TO LEAVE A MESSAGE FOR THE NURSE SELECT OPTION 2, PLEASE LEAVE A MESSAGE INCLUDING: YOUR NAME DATE OF BIRTH CALL BACK NUMBER REASON FOR CALL**this is important as we prioritize the call backs  YOU WILL RECEIVE A CALL BACK THE SAME DAY AS LONG AS YOU CALL BEFORE 4:00 PM  At the Advanced [facility] Failure Clinic, you and your health needs are our priority. As part of our continuing mission to provide you with exceptional heart care, we have created designated Provider Care Teams. These Care Teams include your primary Cardiologist (physician) and Advanced Practice Providers (APPs- Physician Assistants and Nurse Practitioners) who all work together to provide you with the care you need, when you need it.   You may see any of the following providers on your designated Care Team at your next follow up: Dr Arvilla Meres Dr Marca Ancona Dr. Marcos Eke, NP Robbie Lis, [place] [facility] [place], [place] Brynda Peon, NP Karle Plumber, PharmD   Please be sure to bring in all  your medications bottles to every appointment.    Thank you for choosing [facility]

## 2023-03-23 NOTE — Patient Instructions (Signed)
Description   Called and spoke to pt and instructed her to continue taking Warfarin 1 tablet ( ) daily.  Recheck INR in 4 weeks.  Coumadin Clinic  [phone]

## 2023-03-24 ENCOUNTER — Other Ambulatory Visit: Payer: Self-pay | Admitting: Internal Medicine

## 2023-03-24 DIAGNOSIS — Z7901 Long term (current) use of anticoagulants: Secondary | ICD-10-CM

## 2023-04-04 ENCOUNTER — Other Ambulatory Visit: Payer: Self-pay | Admitting: Family Medicine

## 2023-04-04 ENCOUNTER — Ambulatory Visit: Payer: No Typology Code available for payment source

## 2023-04-04 DIAGNOSIS — Z1231 Encounter for screening mammogram for malignant neoplasm of breast: Secondary | ICD-10-CM

## 2023-04-04 DIAGNOSIS — I440 Atrioventricular block, first degree: Secondary | ICD-10-CM

## 2023-04-04 DIAGNOSIS — E2839 Other primary ovarian failure: Secondary | ICD-10-CM

## 2023-04-04 LAB — CUP PACEART REMOTE DEVICE CHECK
Battery Remaining Longevity: 174 mo
Battery Remaining Percentage: 100 %
Brady Statistic RV Percent Paced: 0 %
Date Time Interrogation Session: 20240501020000
HighPow Impedance: 60 Ohm
Implantable Lead Connection Status: 753985
Implantable Lead Implant Date: 20230728
Implantable Lead Location: 753860
Implantable Lead Model: 138
Implantable Lead Serial Number: 303552
Implantable Pulse Generator Implant Date: 20230728
Lead Channel Impedance Value: 489 Ohm
Lead Channel Pacing Threshold Amplitude: 0.6 V
Lead Channel Pacing Threshold Pulse Width: 0.4 ms
Lead Channel Setting Pacing Amplitude: 2.5 V
Lead Channel Setting Pacing Pulse Width: 0.4 ms
Lead Channel Setting Sensing Sensitivity: 0.6 mV
Pulse Gen Serial Number: 217674

## 2023-04-17 ENCOUNTER — Ambulatory Visit (HOSPITAL_COMMUNITY)
Admission: RE | Admit: 2023-04-17 | Discharge: 2023-04-17 | Disposition: A | Discharge status: Home / Self Care | Payer: No Typology Code available for payment source | Source: Ambulatory Visit | Attending: Internal Medicine | Admitting: Internal Medicine

## 2023-04-17 DIAGNOSIS — I517 Cardiomegaly: Secondary | ICD-10-CM

## 2023-04-17 DIAGNOSIS — E785 Hyperlipidemia, unspecified: Secondary | ICD-10-CM | POA: Insufficient documentation

## 2023-04-17 DIAGNOSIS — I071 Rheumatic tricuspid insufficiency: Secondary | ICD-10-CM | POA: Diagnosis not present

## 2023-04-17 DIAGNOSIS — E119 Type 2 diabetes mellitus without complications: Secondary | ICD-10-CM | POA: Diagnosis not present

## 2023-04-17 DIAGNOSIS — I083 Combined rheumatic disorders of mitral, aortic and tricuspid valves: Secondary | ICD-10-CM | POA: Diagnosis not present

## 2023-04-17 DIAGNOSIS — I110 Hypertensive heart disease with heart failure: Secondary | ICD-10-CM | POA: Insufficient documentation

## 2023-04-17 DIAGNOSIS — I5022 Chronic systolic (congestive) heart failure: Secondary | ICD-10-CM

## 2023-04-17 LAB — ECHOCARDIOGRAM COMPLETE
Area-P 1/2: 3.5 cm2
Calc EF: 34.9 %
MV VTI: 0.61 cm2
S' Lateral: 5.5 cm
Single Plane A2C EF: 31.6 %
Single Plane A4C EF: 34.6 %

## 2023-04-17 NOTE — Progress Notes (Signed)
Echocardiogram 2D Echocardiogram has been performed.  Toni Amend 04/17/2023, 2:42 PM

## 2023-04-25 NOTE — Progress Notes (Signed)
Remote ICD transmission.   

## 2023-05-01 ENCOUNTER — Ambulatory Visit: Payer: No Typology Code available for payment source | Attending: Cardiology

## 2023-05-01 DIAGNOSIS — Z7901 Long term (current) use of anticoagulants: Secondary | ICD-10-CM

## 2023-05-01 DIAGNOSIS — I480 Paroxysmal atrial fibrillation: Secondary | ICD-10-CM | POA: Diagnosis not present

## 2023-05-01 DIAGNOSIS — Z951 Presence of aortocoronary bypass graft: Secondary | ICD-10-CM

## 2023-05-01 LAB — POCT INR: INR: 1.6 — AB (ref 2.0–3.0)

## 2023-05-01 NOTE — Patient Instructions (Signed)
Description   Take 1.5 tablets today, then resume same dosage of Warfarin 1 tablet (5mg ) daily.  Recheck INR in 2 weeks.  Coumadin Clinic  [phone]

## 2023-05-17 ENCOUNTER — Ambulatory Visit: Payer: No Typology Code available for payment source

## 2023-05-21 ENCOUNTER — Ambulatory Visit: Payer: No Typology Code available for payment source

## 2023-06-11 ENCOUNTER — Ambulatory Visit: Payer: No Typology Code available for payment source | Attending: Cardiovascular Disease

## 2023-06-11 DIAGNOSIS — Z951 Presence of aortocoronary bypass graft: Secondary | ICD-10-CM | POA: Diagnosis not present

## 2023-06-11 DIAGNOSIS — I480 Paroxysmal atrial fibrillation: Secondary | ICD-10-CM | POA: Diagnosis not present

## 2023-06-11 DIAGNOSIS — Z7901 Long term (current) use of anticoagulants: Secondary | ICD-10-CM

## 2023-06-11 LAB — POCT INR: INR: 2.1 (ref 2.0–3.0)

## 2023-06-11 NOTE — Patient Instructions (Signed)
Continue 1 tablet (5mg ) daily.  Recheck INR in 4 weeks.  Coumadin Clinic  [phone]

## 2023-06-12 ENCOUNTER — Encounter (HOSPITAL_COMMUNITY): Payer: No Typology Code available for payment source

## 2023-06-20 ENCOUNTER — Encounter (HOSPITAL_BASED_OUTPATIENT_CLINIC_OR_DEPARTMENT_OTHER): Payer: Self-pay | Admitting: Cardiovascular Disease

## 2023-06-20 ENCOUNTER — Ambulatory Visit (INDEPENDENT_AMBULATORY_CARE_PROVIDER_SITE_OTHER): Payer: No Typology Code available for payment source | Admitting: Cardiovascular Disease

## 2023-06-20 ENCOUNTER — Ambulatory Visit (HOSPITAL_BASED_OUTPATIENT_CLINIC_OR_DEPARTMENT_OTHER): Payer: No Typology Code available for payment source | Admitting: Cardiovascular Disease

## 2023-06-20 VITALS — BP 129/89 | HR 90 | Ht 63.0 in | Wt 190.5 lb

## 2023-06-20 DIAGNOSIS — I5023 Acute on chronic systolic (congestive) heart failure: Secondary | ICD-10-CM | POA: Diagnosis not present

## 2023-06-20 DIAGNOSIS — I480 Paroxysmal atrial fibrillation: Secondary | ICD-10-CM

## 2023-06-20 DIAGNOSIS — I493 Ventricular premature depolarization: Secondary | ICD-10-CM

## 2023-06-20 DIAGNOSIS — I10 Essential (primary) hypertension: Secondary | ICD-10-CM

## 2023-06-20 DIAGNOSIS — Z951 Presence of aortocoronary bypass graft: Secondary | ICD-10-CM

## 2023-06-20 DIAGNOSIS — I472 Ventricular tachycardia, unspecified: Secondary | ICD-10-CM

## 2023-06-20 MED ORDER — CARVEDILOL 6.25 MG PO TABS: 6.2500 mg | ORAL_TABLET | Freq: Two times a day (BID) | ORAL | 3 refills | Status: DC

## 2023-06-20 NOTE — Patient Instructions (Addendum)
Medication Instructions:  INCREASE  YOUR CARVEDILOL TO 6.25 MG TWICE A DAY   Labwork: NONE  Testing/Procedures: NONE  Follow-Up: AS NEEDED WITH DR [facility]   Any Other Special Instructions Will Be Listed Below (If Applicable).  MONITOR YOUR BLOOD PRESSURE TWICE A DAY AND BRING YOUR READINGS TO YOUR FOLLOW UP APPOINTMENT WITH DR Gala Romney LATER THIS MONTH

## 2023-06-20 NOTE — Progress Notes (Signed)
Cardiology Office Note:  .    Date:  06/20/2023  ID:  [name], DOB 1956/01/05, MRN [id number] PCP: Deatra James, MD  [facility] Providers Cardiologist:  Chilton Si, MD     History of Present Illness: .    Rachael Davis is a 67 y.o. 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  Rachael Davis presented 04/2017 with STEMI.  Left heart catheterization revealed 100% left circumflex, 95% proximal RCA, 90% ramus intermedius, and 65% LAD.   LVEF was 35-45%.   Her echo 04/05/17 revealed LVEF 45% with lateral hypokinesis and mild MR.  She underwent four-vessel CABG (LIMA-->LAD, SVG-->RI, SVG-->OM, SVG-->RCA) on 04/16/17. In the OR she was also noted to have moderate to severe mitral regurgitation so she also underwent replacement of the mitral valve with a 25 mm [facility] Ease bioprosthetic valve.  During the hospitalization she required diuresis for acute systolic and diastolic heart failure.  She also had a brief episode of atrial fibrillation that resolved with metoprolol.  She saw Rachael Davis in clinic 05/08/17 and was volume overloaded.  Lasix and metoprolol were increased.  She followed up with Dr. Donata Clay 05/23/17 and was found to be in atrial fibrillation.  She had a repeat echocardiogram 05/24/17 that revealed LVEF 35-40% with akinesis of the inferolateral myocardium and basal inferior myocardium. Her bioprosthetic mitral valve was functioning well. There was a small mobile target on the prosthesis that was thought to be residual native valve.  She was admitted and the heart failure team was consulted.  She spontaneously converted to sinus tachycardia. Metoprolol was discontinued 2/2 low output and she was started on amiodarone.  She underwent L thoracentesis.  Her discharge weight was 177 pounds. She was  started on carvedilol 3.125mg  bid.  She wore a 48 hour Holter 10/2017 that showed 3% PVCs.   Rachael Davis had a repeat echo 02/2018 that revealed LVEF 35-40% with a mean gradient across the mitral valve of 15 mmHg.  The gradient decreased to 8 mmHg on subsequent echo in 2020.  She reported palpitations and wore an ambulatory monitor 03/2018 that showed PVCs but no atrial fibrillation.  She saw Dr. Gala Romney 05/2019 and was started on carvedilol due to frequent PVCs.  In the past she had PR prolongation on higher doses of carvedilol and on metoprolol.  Atorvastatin was increased 10/14/2019.  In May 2021 she had an episode of shortness of breath.  She came for her coumadin check and mentioned it.  Lasix was increased to 80 mg and her symptoms improved.    She had a repeat echo 12/2020 that revealed LVEF 30-35%. PASP was . Mean gradient on bioprosthetic mitral valve was 8. RA pressure was 15.  She followed up with Rachael Davis 02/2021 and was feeling well. She noted that she felt better since discontinuing Entresto. She reported frequent palpitations. She wore a 7 day monitor that showed up 6 beat runs of NSVT and frequent episodes of SVT lasting up to 5 minutes.   At her visit 09/2021 she complained of ongoing DOE since 02/2021 as well as daily palpitations similar to her prior Afib episodes. She complained of 3-4 weeks of swelling in her feet, worse in the evenings. She was volume overloaded and Lasix was increased to daily for several days. She was seen by Dr. Graciela Husbands 02/2022 and [name] was  resumed with [facility] following discontinuation of her losartan. Given notable PVCs on exam she wore a Zio monitor which revealed sustained VT. She had been unable to tolerate Entresto or Spyro due to hyperkalemia, and Lokelma was cost prohibitive. Carvedilol was increased to 6.25 mg BID. Right/Left Heart catheterization 04/2022 showed LIMA to LAD and SVG to OM patent, occluded SVG to RCA and SVG to Ramus, normal LVEDP,  moderate mixed PAH, severe mitral stenosis (felt to be overestimated). Med management of CAD recommended. She underwent [employer] ICD implant 06/30/2022. In 03/2023 she followed up with Dr. Gala Romney. She had cut back lasix from 80 mg BID to 80 mg daily due to work. She was instructed to take Lasix BID on the weekends. Continued Coreg, Farxiga, Entresto, Hydralazine, and Imdur. She had an echo 04/2023 revealing LVEF 25-30%, left ventricular global hypokinesis, right ventricular systolic pressure 49.1 mmHg, trivial mitral valve regurgitation, mild to moderate tricuspid valve regurgitation.  Today, she states she is feeling fairly well. In the office today her blood pressure is 129/89; she does not own a blood pressure cuff at home. She still experiences heart flutters mostly depending on her position. For instance, she notes that while sitting upright in a chair she generally feels okay without palpitations. When lying down on her right side, she has harder heartbeats that do improve with switching to her left side. Overall she has episodes of palpitations maybe 2-3 times per week, lasting for about 5 seconds. No associated dizziness or lightheadedness. However, about 3 weeks ago she had an episode of lightheadedness/cognitive changes independent of her palpitations. This was in the setting of sitting down and talking on the phone. At the time, she remembers telling her daughter who was in the home that she was feeling dizzy. She couldn't think of what to say on the phone or form the words properly. No difficulty with ambulation, no slurred speech. She remained sitting for a while and her symptoms eventually resolved on their own. Also, she continues to struggle with some fluid retention intermittently, especially around her ankles bilaterally. In addition to her daily Lasix she will take a second dose on the weekends, and an extra dose when needed (which is about twice per week). Her breathing has been "a  little rough" especially in the hot weather. From walking in to the parking lot she could tell there was a difference in her breathing. Usually she will go to a store to walk around to stay active. Additionally she complains of tingling sensations of her toes. She denies any chest pain, headaches, syncope, orthopnea, or PND.  ROS:  Please see the history of present illness. All other systems are reviewed and negative.  (+) Palpitations (+) Isolated episode of lightheadedness/dizziness with cognitive changes (+) Bilateral ankle edema (+) Shortness of breath (+) Tingling sensations of bilateral toes  Studies Reviewed: .        Echocardiogram 04/17/2023: Sonographer Comments: Global longitudinal strain was attempted.   IMPRESSIONS   1. Left ventricular ejection fraction, by estimation, is 25 to 30%. The  left ventricle has severely decreased function. The left ventricle  demonstrates global hypokinesis. The left ventricular internal cavity size  was moderately dilated. Left  ventricular diastolic parameters are indeterminate.   2. Right ventricular systolic function is moderately reduced. The right  ventricular size is mildly enlarged. There is moderately elevated  pulmonary artery systolic pressure. The estimated right ventricular  systolic pressure is 49.1 mmHg.   3. Left atrial size was moderately dilated.  4. The mitral valve has been repaired/replaced. There is a 25mm [employer]  Ease pericardial bioprosthesis present in the mitral position. There is  trivial mitral valve regurgitation. There are mildly elevated gradients  across the mitral valve prosthesis with   a mean mitral valve gradient is 8.0 mmHg at HR 85bpm. This is consistent  with prior studies.   5. Tricuspid valve regurgitation is mild to moderate.   6. The aortic valve is tricuspid. Aortic valve regurgitation is not  visualized. Aortic valve sclerosis is present, with no evidence of aortic  valve stenosis.   7. The  inferior vena cava is dilated in size with <50% respiratory  variability, suggesting right atrial pressure of 15 mmHg.   Comparison(s): No significant change from prior study. Prior TTE 12/2021  with EF 25-30%, mod RV dysfunction, mean mitral gradient .   Risk Assessment/Calculations:    CHA2DS2-VASc Score = 6   This indicates a 9.7% annual risk of stroke. The patient's score is based upon: CHF History: 1 HTN History: 1 Diabetes History: 1 Stroke History: 0 Vascular Disease History: 1 Age Score: 1 Gender Score: 1            Physical Exam:    VS:  BP 129/89 (BP Location: Left Arm, Patient Position: Sitting, Cuff Size: Large)   Pulse 90   Ht 5\' 3"  (1.6 m)   Wt 190 lb 8 oz (86.4 kg)   SpO2 100%   BMI 33.75 kg/m  , BMI Body mass index is 33.75 kg/m. GENERAL:  Well appearing HEENT: Pupils equal round and reactive, fundi not visualized, oral mucosa unremarkable NECK:  JVP 1 cm above clavicle at 45 degrees, waveform within normal limits, carotid upstroke brisk and symmetric, no bruits, no thyromegaly LUNGS:  Clear to auscultation bilaterally HEART:  RRR.  PMI not displaced or sustained,S1 and S2 within normal limits, no S3, no S4, no clicks, no rubs, no murmurs ABD:  Flat, positive bowel sounds normal in frequency in pitch, no bruits, no rebound, no guarding, no midline pulsatile mass, no hepatomegaly, no splenomegaly EXT:  2 plus pulses throughout, trace LE edema, no cyanosis no clubbing SKIN:  No rashes no nodules NEURO:  Cranial nerves II through XII grossly intact, motor grossly intact throughout PSYCH:  Cognitively intact, oriented to person place and time  Wt Readings from Last 3 Encounters:  06/20/23 190 lb 8 oz (86.4 kg)  03/23/23 183 lb 9.6 oz (83.3 kg)  10/23/22 181 lb 3.2 oz (82.2 kg)     ASSESSMENT AND PLAN: .    # HFrEF:  # Hypertension: LVEF 25-30%.  Reports of persistent fluid retention, particularly around the ankles, requiring additional Lasix 1-2  times per week. Shortness of breath noted, particularly in hot weather and when walking from the parking lot.  She is unable to take lasix bid due to work.  Continue to take extra as needed.  She is mildly volume overloaded today. She continues to follow up in HF clinic.  Pulmonary pressure were elevated and RA 15 mmHg on echo.  Encouraged her to take the second dose of lasix whenever possible.  -Continue current management with Carvedilol, Farxiga, hydralazine, Imdur, Entresto, and Lasix.  No spironolactone 2/2 hyperkalemia. -Advise patient to take an extra dose of Lasix when feeling more short of breath or noticing increased swelling.  # Paroxysmal Atrial Fibrillation:  # PVCs:  Reports of intermittent heart fluttering, occurring 2-3 times per week, lasting approximately 5 seconds. No associated dizziness or lightheadedness.  Sounds more like PVCs than atrial fibrillation.  Currently on Carvedilol 3.125mg . -Increase Carvedilol to 6.25mg  daily to reduce palpitations. -Advise patient to monitor blood pressure at home to ensure it remains above 100 systolic. - Continue warfarin  # CAD s/p CABG:  # Hyperlipidemia: No ischemic symptoms.  Continue aspirin, carvedilol, atorvastatin, Imdur.  # s/p MVR:  Bioprosthetic MVR.  Mean gradient 8 mmHg on echo 04/2023.  It was 7 mmHg the year prior.  Mean gradient on RHC 04/2022 was 13 mmHg, thought to be overestimated.  Will need to continue to follow.  Echo personally reviewed and there is no severe MS.  No intervention at this time.    Follow-up: Continue follow-up with Dr. Milas Kocher for heart failure management. Primary care cardiologist to see patient as needed.        Dispo:  FU with Adisen Bennion C. Duke Salvia, MD, [facility] as needed. Keep follow-up with Dr. Gala Romney later this month.  I,Mathew Stumpf,acting as a Neurosurgeon for Chilton Si, MD.,have documented all relevant documentation on the behalf of Chilton Si, MD,as directed by  Chilton Si, MD  while in the presence of Chilton Si, MD.  I, Camille Dragan C. Duke Salvia, MD have reviewed all documentation for this visit.  The documentation of the exam, diagnosis, procedures, and orders on 06/20/2023 are all accurate and complete.   Signed, Chilton Si, MD

## 2023-07-02 NOTE — Progress Notes (Signed)
Advanced Heart Failure Clinic Note   PCP: Deatra James, MD Primary Cardiologist: Dr. Duke Salvia HF Cardiologist: Dr. Gala Romney  HPI: 67 y.o. 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   She was previously followed by Dr. Gala Romney in Advanced Heart Failure clinic, graduated from clinic in 05/2019. Echo 05/2019: EF 35-40%. Has been followed by Dr. Duke Salvia with Cardiology.  Echo 1/22: EF 30-35%  Echo 1/23: EF 25-30%, RV moderately reduced, RVSP 38.8 mmHg, trivial MR, mean gradient 7 mmHg across mitral valve prosthesis, IVC dilated with estimated RAP 15 mmHg  Saw EP in 3/23. D/t presence of progressive conduction system disease workup for amyloidosis recommended. Unable to tolerate cMRI d/t claustrophobia.   7 day Zio 4/23: SR with avg rate 83 bpm, 1st degree AVB and intermittent Wenckebach AV block noted. 2.3% PVCs. 6 runs VT, longest 27 min and 48 seconds. ICD recommended. She wanted to discuss with family before proceeding.  Admitted 5/23 with a/c CHF. Diuresed with IV lasix. Discharged on furosemide 80 mg daily. GDMT - coreg 3.125 mg BID and dapagliflozin 10 mg daily.   Seen in [facility] clinic 04/17/22. Added back Entresto, later stopped when she developed hyperkalemia. Felt better when she was on Entresto in the past. LifeVest placed dt/ episode sustained VT on monitor in April.  PYP scan 04/20/22 equivocal for TTR amyloidosis.  R/LHC 04/20/22: LIMA to LAD and SVG to OM patent, occluded SVG to RCA and SVG to Ramus, normal LVEDP, moderate mixed PAH, severe mitral stenosis (felt to be overestimated). Med management of CAD recommended, if has refractory angina could consider CTO RCA.  S/p [employer] ICD implant 7/23.   Seen in ED 09/14/22 with SOB. Given IV lasix with improvement of symptoms.  Follow up 10/23, NYHA II and volume overloaded, ReDs 40%.  Echo 5/24 showed EF  25-30%, RV moderately reduced, stable mitral valve prosthesis, mild to moderate TR  Today she returns for HF follow up. Overall feeling fine. She has "good days and bad days." Has some ankle swelling. She has occasional dizziness, most recent episode last month, no falls. She has frequent palpitations. Denies abnormal bleeding, CP, or PND/Orthopnea. Appetite ok. No fever or chills. She lost her scale. Taking all medications. 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    Cardiac Studies  - Echo (5/24): EF 25-30%, RV moderately reduced, stable mitral valve prosthesis, mild to moderate TR  - R/LHC (5/23):  1. Severe 3v CAD 2. LIMA to LAD and SVG to OM-1 widely patent 3. SVG to RCA and SVG to Ramus occluded 4. LVEF 25% 5. Normal LVEDP 6. Moderate mixed PAH 7. Apparent severe mitral stenosis (suspect overestimated based on echo)  Ao = 126/65 (90)  LV = 114/13 RA =  10 RV = 52/9 PA = 50/18 (33) PCW = 22 (v = 30) Fick cardiac output/index = 4.6/2.6 PVR = 2.4 WU FA sat = 94% PA sat = 60%, 63% MV gradient on LV-wedge tracing = mean 13.0 MVA 0.93cm2 PAPi = 3.2  - PYP (5/23): equivocal for TTR amyloidosis (Bensimhon read: completely negative)  - Zio (4/23): SR with avg rate 83 bpm, 1st degree AVB and intermittent Wenckebach AV block noted. 2.3% PVCs. 6 runs VT, longest 27 min and 48 seconds.   - Echo (1/23): EF 25-30%, RV moderately reduced, RVSP  38.8 mmHg, trivial MR, mean gradient 7 mmHg across mitral valve prosthesis, IVC dilated with estimated RAP 15 mmHg  - Echo (1/22): EF 30-35%  - Echo (6/20): EF 35-40%.   Review of Systems: Cardiac and respiratory. Negative except as mentioned in HPI.  Past Medical History:  Diagnosis Date   Arrhythmia    Hx PAF, PVC's   CHF (congestive heart failure) (HCC)    Coronary artery disease    04/04/2017 STEMI   Diabetes [facility])    H/O mitral valve replacement 09/21/2021    Heart attack (HCC)    per pt reprot   Hyperlipidemia    Hypertension    New onset atrial fibrillation (HCC) 05/2017   Current Outpatient Medications  Medication Sig Dispense Refill   acetaminophen (TYLENOL) 325 MG tablet Take 2 tablets (650 mg total) by mouth every 6 (six) hours as needed for mild pain or headache.     aspirin EC 81 MG tablet Take 81 mg by mouth daily.     atorvastatin (LIPITOR) 40 MG tablet TAKE 1 TABLET BY MOUTH DAILY AT 6 PM. 90 tablet 0   carvedilol (COREG) 6.25 MG tablet Take 1 tablet (6.25 mg total) by mouth 2 (two) times daily. 180 tablet 3   dapagliflozin propanediol (FARXIGA) 10 MG TABS tablet Take 10 mg by mouth daily.     DM-APAP-CPM (CORICIDIN HBP PO) Take 2 tablets by mouth daily as needed (congestion).     ENTRESTO 24-26 MG TAKE 1 TABLET BY MOUTH TWICE A DAY 60 tablet 3   furosemide (LASIX) 80 MG tablet TAKE 1 TABLET BY MOUTH EVERY DAY 90 tablet 1   glipiZIDE (GLUCOTROL XL) 10 MG 24 hr tablet Take 1 tablet (10 mg total) by mouth daily with breakfast.     hydrALAZINE (APRESOLINE) 25 MG tablet TAKE 1/2 OF A TABLET (12.5 MG TOTAL) BY MOUTH 3 TIMES A DAY 135 tablet 1   isosorbide mononitrate (IMDUR) 30 MG 24 hr tablet TAKE 1/2 OF A TABLET (15 MG TOTAL) BY MOUTH DAILY 45 tablet 3   metFORMIN (GLUCOPHAGE) 1000 MG tablet Take 1,000 mg by mouth 2 (two) times daily with a meal.     potassium chloride SA (KLOR-CON M) 20 MEQ tablet Take 20 mEq by mouth daily.     RYBELSUS 7 MG TABS 1 tablet at least 30 minutes before first food, beverage or other oral medicine of the day Orally Once a day for 90 days     warfarin (COUMADIN) 5 MG tablet TAKE 1 TABLET BY MOUTH DAILY OR AS DIRECTED BY COUMADIN CLINIC 90 tablet 1   No current facility-administered medications for this encounter.   Allergies  Allergen Reactions   Keflex [Cephalexin] Rash    Skin on feet peeled.   Asa [Aspirin]     325 mg ----heartburn Pt can take 81 mg   Lisinopril Cough   Social History    Socioeconomic History   Marital status: Married    Spouse name: Inda Newbrough   Number of children: 1   Years of education: Not on file   Highest education level: Bachelor's degree (e.g., BA, AB, BS)  Occupational History   Occupation: Armed forces training and education officer    Comment: Full time  Tobacco Use   Smoking status: Never   Smokeless tobacco: Never  Vaping Use   Vaping status: Never Used  Substance and Sexual Activity   Alcohol use: No   Drug use: Yes    Types: Marijuana    Comment: In her  20 's   Sexual activity: Not on file  Other Topics Concern   Not on file  Social History Narrative   Not on file   Social Determinants of Health   Financial Resource Strain: Not on file  Food Insecurity: No Food Insecurity (04/06/2022)   Hunger Vital Sign    Worried About Running Out of Food in the Last Year: Never true    Ran Out of Food in the Last Year: Never true  Transportation Needs: No Transportation Needs (04/06/2022)   PRAPARE - Administrator, Civil Service (Medical): No    Lack of Transportation (Non-Medical): No  Physical Activity: Not on file  Stress: Not on file  Social Connections: Not on file  Intimate Partner Violence: Not on file   Family History  Problem Relation Age of Onset   Heart attack Father 36   Breast cancer Maternal Grandmother    BP 136/82   Pulse 89   Wt 86.1 kg (189 lb 12.8 oz)   SpO2 99%   BMI 33.62 kg/m   Wt Readings from Last 3 Encounters:  07/03/23 86.1 kg (189 lb 12.8 oz)  06/20/23 86.4 kg (190 lb 8 oz)  03/23/23 83.3 kg (183 lb 9.6 oz)   PHYSICAL EXAM: General:  NAD. No resp difficulty, walked into clinic HEENT: Normal Neck: Supple. JVP 7-8 Carotids 2+ bilat; no bruits. No lymphadenopathy or thryomegaly appreciated. Cor: PMI nondisplaced. Regular rate & rhythm. No rubs, gallops or murmurs. Lungs: Clear Abdomen: Soft, obese, nontender, nondistended. No hepatosplenomegaly. No bruits or masses. Good bowel sounds. Extremities: No  cyanosis, clubbing, rash, + pedal edema Neuro: Alert & oriented x 3, cranial nerves grossly intact. Moves all 4 extremities w/o difficulty. Affect pleasant.  ECG (personally reviewed): SR with AVB PR 344 msec, 87 bpm  ICD interrogation (personally reviewed): HL score 13, impedance ok, activity level 0.5 hr/day, average HR 79 bpm  ASSESSMENT & PLAN: Chronic Systolic Heart Failure/ICM - Echo (02/06/2018): EF 35-40% - Echo (6/20): EF 35-40% - Echo (1/22): EF 30-35% - Echo (1/23): EF 25-30% (appears closer to 20% on review with Dr. Gala Romney) - Sustained VT on zio 04/23. Wore LifeVest while deciding on ICD. - R/LHC (04/20/22): LIMA to LAD and SVG to OM patent, occluded SVG to RCA and SVG to Ramus, normal LVEDP, moderate mixed PAH, severe mitral stenosis (felt to be overestimated). Med management of CAD recommended, if has refractory angina could consider CTO RCA. - Unable to tolerate MRI d/t claustrophobia.  - PYP scan (5/23): negative. - s/p [employer] ICD 7/23 - Echo (5/24): EF 25-30%, RV moderately reduced, stable mitral valve prosthesis, mild to moderate TR - NYHA IIb. Volume ok, up a bit on exam - Continue Lasix 80 mg daily + 40 KCL daily. Will have her take lasix 80 bid + 40 KCL daily on weekends. - Continue Coreg 6.25 mg bid. Follow carefully with baseline conduction system disease.  - Continue Farxiga 10 mg daily. - Continue Entresto 24/26 mg bid. - Continue hydralazine 12.5 mg tid + Imdur 15 mg daily. - Off spiro d/t hyperkalemia.  - Labs today   2. CAD S/P CABG x4: - LHC 04/20/22: LIMA to LAD and SVG to OM patent, occluded SVG to RCA and SVG to Ramus - Managing CAD medically as above. - No recent angina. - Continue statin and ASA.  3. Bioprosthetic MVR:  - Echo 1/23: EF 25-30%, trivial MR, mild mitral stenosis with mean gradient 7 mmHg  - RHC 5/23:  MV gradient on LV wedge tracing - mean 13, MVA 0.93 cm2, suggestive of severe MS (likely overestimated when compared to echo) -  Echo (5/24) with mean gradient 8 mmHg, no mitral stenosis - Continue warfarin and ASA. Denies bleeding. - Aware of SBE prophylaxis   4. VT/PVCs:  - 7 day Zio, 04/23: 2.3% PVCs, sustained VT (longest 27 minutes) - s/p ICD implant 06/30/22.  - No ectopy of ECG today. Continues with palpitations - Check labs and magnesium - ? Sleep study  5. PAF - NSR on ECG today. - Continue warfarin. Denies bleeding - INR followed by Coumadin Clinic  6. DM2 - Per PCP  7. CKD 3 - Baseline SCr 1.4-1.6 - BMET today. - Continue Farxiga   8. Goiter - Thyroid nodule biopsied 10/20, benign follicular nodule - TSH 0.436 and free T4 1.10 (4/24) - no longer follows with Endo, PCP manages.   Follow up in 3 months with Dr. Gala Romney  Jacklynn Ganong, FNP  2:36 PM

## 2023-07-03 ENCOUNTER — Ambulatory Visit
Admission: RE | Admit: 2023-07-03 | Discharge: 2023-07-03 | Disposition: A | Discharge status: Home / Self Care | Payer: No Typology Code available for payment source | Source: Ambulatory Visit | Attending: Family Medicine | Admitting: Family Medicine

## 2023-07-03 ENCOUNTER — Encounter: Payer: Self-pay | Admitting: Internal Medicine

## 2023-07-03 ENCOUNTER — Encounter (HOSPITAL_COMMUNITY): Payer: Self-pay

## 2023-07-03 ENCOUNTER — Ambulatory Visit (INDEPENDENT_AMBULATORY_CARE_PROVIDER_SITE_OTHER): Payer: No Typology Code available for payment source | Admitting: Internal Medicine

## 2023-07-03 VITALS — BP 136/82 | HR 89 | Wt 189.8 lb

## 2023-07-03 VITALS — BP 122/80 | HR 89 | Ht 63.0 in | Wt 189.6 lb

## 2023-07-03 DIAGNOSIS — Z7984 Long term (current) use of oral hypoglycemic drugs: Secondary | ICD-10-CM | POA: Diagnosis not present

## 2023-07-03 DIAGNOSIS — E041 Nontoxic single thyroid nodule: Secondary | ICD-10-CM | POA: Insufficient documentation

## 2023-07-03 DIAGNOSIS — I255 Ischemic cardiomyopathy: Secondary | ICD-10-CM | POA: Insufficient documentation

## 2023-07-03 DIAGNOSIS — Z8679 Personal history of other diseases of the circulatory system: Secondary | ICD-10-CM | POA: Diagnosis not present

## 2023-07-03 DIAGNOSIS — I2510 Atherosclerotic heart disease of native coronary artery without angina pectoris: Secondary | ICD-10-CM | POA: Insufficient documentation

## 2023-07-03 DIAGNOSIS — Z951 Presence of aortocoronary bypass graft: Secondary | ICD-10-CM

## 2023-07-03 DIAGNOSIS — I130 Hypertensive heart and chronic kidney disease with heart failure and stage 1 through stage 4 chronic kidney disease, or unspecified chronic kidney disease: Secondary | ICD-10-CM | POA: Diagnosis not present

## 2023-07-03 DIAGNOSIS — I4510 Unspecified right bundle-branch block: Secondary | ICD-10-CM

## 2023-07-03 DIAGNOSIS — Z7901 Long term (current) use of anticoagulants: Secondary | ICD-10-CM | POA: Insufficient documentation

## 2023-07-03 DIAGNOSIS — I480 Paroxysmal atrial fibrillation: Secondary | ICD-10-CM | POA: Insufficient documentation

## 2023-07-03 DIAGNOSIS — D8685 Sarcoid myocarditis: Secondary | ICD-10-CM

## 2023-07-03 DIAGNOSIS — F40240 Claustrophobia: Secondary | ICD-10-CM | POA: Diagnosis not present

## 2023-07-03 DIAGNOSIS — Z95810 Presence of automatic (implantable) cardiac defibrillator: Secondary | ICD-10-CM | POA: Insufficient documentation

## 2023-07-03 DIAGNOSIS — I25810 Atherosclerosis of coronary artery bypass graft(s) without angina pectoris: Secondary | ICD-10-CM | POA: Diagnosis not present

## 2023-07-03 DIAGNOSIS — R002 Palpitations: Secondary | ICD-10-CM | POA: Insufficient documentation

## 2023-07-03 DIAGNOSIS — Z952 Presence of prosthetic heart valve: Secondary | ICD-10-CM | POA: Diagnosis not present

## 2023-07-03 DIAGNOSIS — E049 Nontoxic goiter, unspecified: Secondary | ICD-10-CM

## 2023-07-03 DIAGNOSIS — E119 Type 2 diabetes mellitus without complications: Secondary | ICD-10-CM

## 2023-07-03 DIAGNOSIS — Z79899 Other long term (current) drug therapy: Secondary | ICD-10-CM | POA: Diagnosis not present

## 2023-07-03 DIAGNOSIS — I052 Rheumatic mitral stenosis with insufficiency: Secondary | ICD-10-CM | POA: Insufficient documentation

## 2023-07-03 DIAGNOSIS — Z7982 Long term (current) use of aspirin: Secondary | ICD-10-CM | POA: Insufficient documentation

## 2023-07-03 DIAGNOSIS — I493 Ventricular premature depolarization: Secondary | ICD-10-CM | POA: Diagnosis not present

## 2023-07-03 DIAGNOSIS — I5022 Chronic systolic (congestive) heart failure: Secondary | ICD-10-CM

## 2023-07-03 DIAGNOSIS — Z953 Presence of xenogenic heart valve: Secondary | ICD-10-CM | POA: Insufficient documentation

## 2023-07-03 DIAGNOSIS — I440 Atrioventricular block, first degree: Secondary | ICD-10-CM

## 2023-07-03 DIAGNOSIS — E1122 Type 2 diabetes mellitus with diabetic chronic kidney disease: Secondary | ICD-10-CM | POA: Insufficient documentation

## 2023-07-03 DIAGNOSIS — I472 Ventricular tachycardia, unspecified: Secondary | ICD-10-CM

## 2023-07-03 DIAGNOSIS — N183 Chronic kidney disease, stage 3 unspecified: Secondary | ICD-10-CM | POA: Diagnosis not present

## 2023-07-03 LAB — BASIC METABOLIC PANEL
Anion gap: 11 (ref 5–15)
BUN: 21 mg/dL (ref 8–23)
CO2: 24 mmol/L (ref 22–32)
Calcium: 8.9 mg/dL (ref 8.9–10.3)
Chloride: 103 mmol/L (ref 98–111)
Creatinine, Ser: 1.42 mg/dL — ABNORMAL HIGH (ref 0.44–1.00)
GFR, Estimated: 40 mL/min — ABNORMAL LOW (ref >60)
Glucose, Bld: 160 mg/dL — ABNORMAL HIGH (ref 70–99)
Potassium: 4 mmol/L (ref 3.5–5.1)
Sodium: 138 mmol/L (ref 135–145)

## 2023-07-03 LAB — CBC
HCT: 40.8 % (ref 36.0–46.0)
Hemoglobin: 12.2 g/dL (ref 12.0–15.0)
MCH: 21.9 pg — ABNORMAL LOW (ref 26.0–34.0)
MCHC: 29.9 g/dL — ABNORMAL LOW (ref 30.0–36.0)
MCV: 73.2 fL — ABNORMAL LOW (ref 80.0–100.0)
Platelets: 177 10*3/uL (ref 150–400)
RBC: 5.57 MIL/uL — ABNORMAL HIGH (ref 3.87–5.11)
RDW: 19.9 % — ABNORMAL HIGH (ref 11.5–15.5)
WBC: 6.1 10*3/uL (ref 4.0–10.5)
nRBC: 0 % (ref 0.0–0.2)

## 2023-07-03 LAB — BRAIN NATRIURETIC PEPTIDE: B Natriuretic Peptide: 813.8 pg/mL — ABNORMAL HIGH (ref 0.0–100.0)

## 2023-07-03 LAB — MAGNESIUM: Magnesium: 2.3 mg/dL (ref 1.7–2.4)

## 2023-07-03 MED ORDER — FUROSEMIDE 80 MG PO TABS: 80.0000 mg | ORAL_TABLET | Freq: Every day | ORAL | 3 refills | Status: DC

## 2023-07-03 MED ORDER — POTASSIUM CHLORIDE CRYS ER 20 MEQ PO TBCR: 20.0000 meq | EXTENDED_RELEASE_TABLET | Freq: Every day | ORAL | 6 refills | Status: DC

## 2023-07-03 NOTE — Patient Instructions (Addendum)
Medication Instructions:  Your physician recommends that you continue on your current medications as directed. Please refer to the Current Medication list given to you today.  *If you need a refill on your cardiac medications before your next appointment, please call your pharmacy*   Lab Work: None ordered.  If you have labs (blood work) drawn today and your tests are completely normal, you will receive your results only by: MyChart Message (if you have MyChart) OR A paper copy in the mail If you have any lab test that is abnormal or we need to change your treatment, we will call you to review the results.   Testing/Procedures:  Dr Graciela Husbands would like for you to have a cardiac PET - You will be contacted to schedule this test   Follow-Up: At [facility], you and your health needs are our priority.  As part of our continuing mission to provide you with exceptional heart care, we have created designated Provider Care Teams.  These Care Teams include your primary Cardiologist (physician) and Advanced Practice Providers (APPs -  Physician Assistants and Nurse Practitioners) who all work together to provide you with the care you need, when you need it.  We recommend signing up for the patient portal called "MyChart".  Sign up information is provided on this After Visit Summary.  MyChart is used to connect with patients for Virtual Visits (Telemedicine).  Patients are able to view lab/test results, encounter notes, upcoming appointments, etc.  Non-urgent messages can be sent to your provider as well.   To learn more about what you can do with MyChart, go to [email or web address].    Your next appointment:   12 months with Dr Graciela Husbands   How to Prepare for Your Cardiac PET/CT Stress Test:  1. Please do not take these medications before your test:   Medications that may interfere with the cardiac pharmacological stress agent (ex. nitrates - including erectile dysfunction medications,  isosorbide mononitrate, tamulosin or beta-blockers) the day of the exam. (Erectile dysfunction medication should be held for at least 72 hrs prior to test) Theophylline containing medications for 12 hours. Dipyridamole 48 hours prior to the test. Your remaining medications may be taken with water.  2. Nothing to eat or drink, except water, 3 hours prior to arrival time.   NO caffeine/decaffeinated products, or chocolate 12 hours prior to arrival.  3. NO perfume, cologne or lotion  4. Total time is 1 to 2 hours; you may want to bring reading material for the waiting time.  5. Please report to Radiology at the [facility] Main Entrance 30 minutes early for your test.  [address] [place], [place] [address]  6. Please report to Radiology at [facility] Main Entrance, medical mall, 30 mins prior to your test.  [address]  [place], [place]  [phone]  Diabetic Preparation:  Hold oral medications. You may take NPH and Lantus insulin. Do not take Humalog or Humulin R (Regular Insulin) the day of your test. Check blood sugars prior to leaving the house. If able to eat breakfast prior to 3 hour fasting, you may take all medications, including your insulin, Do not worry if you miss your breakfast dose of insulin - start at your next meal.  IF YOU THINK YOU MAY BE PREGNANT, OR ARE NURSING PLEASE INFORM THE TECHNOLOGIST.  In preparation for your appointment, medication and supplies will be purchased.  Appointment availability is limited, so if you need to  cancel or reschedule, please call the Radiology Department at [phone] [facility]) OR [phone] [facility])  24 hours in advance to avoid a cancellation fee of $100.00  What to Expect After you Arrive:  Once you arrive and check in for your appointment, you will be taken to a preparation room within the Radiology Department.  A technologist or Nurse will obtain your medical history, verify  that you are correctly prepped for the exam, and explain the procedure.  Afterwards,  an IV will be started in your arm and electrodes will be placed on your skin for EKG monitoring during the stress portion of the exam. Then you will be escorted to the PET/CT scanner.  There, staff will get you positioned on the scanner and obtain a blood pressure and EKG.  During the exam, you will continue to be connected to the EKG and blood pressure machines.  A small, safe amount of a radioactive tracer will be injected in your IV to obtain a series of pictures of your heart along with an injection of a stress agent.    After your Exam:  It is recommended that you eat a meal and drink a caffeinated beverage to counter act any effects of the stress agent.  Drink plenty of fluids for the remainder of the day and urinate frequently for the first couple of hours after the exam.  Your doctor will inform you of your test results within 7-10 business days.  For more information and frequently asked questions, please visit our website : [email or web address]  For questions about your test or how to prepare for your test, please call: Cardiac Imaging Nurse Navigators Office: [phone]

## 2023-07-03 NOTE — Patient Instructions (Signed)
Thank you for coming in today  If you had labs drawn today, any labs that are abnormal the clinic will call you No news is good news  You have be given a scale  prescription for a blood pressure cuff has been given to you and you can purchase at a medical supply store or pharmacy  Medications: TAKE Lasix 80 mg twice daily on Saturdays and Sundays ONLY TAKE 40 meq of Potassium on Saturdays and Sundays ONLY  Follow up appointments:  Your physician recommends that you schedule a follow-up appointment in:  3 months With Dr. Gala Romney    Do the following things EVERYDAY: Weigh yourself in the morning before breakfast. Write it down and keep it in a log. Take your medicines as prescribed Eat low salt foods--Limit salt (sodium) to 2000 mg per day.  Stay as active as you can everyday Limit all fluids for the day to less than 2 liters   At the [facility], you and your health needs are our priority. As part of our continuing mission to provide you with exceptional heart care, we have created designated Provider Care Teams. These Care Teams include your primary Cardiologist (physician) and Advanced Practice Providers (APPs- Physician Assistants and Nurse Practitioners) who all work together to provide you with the care you need, when you need it.   You may see any of the following providers on your designated Care Team at your next follow up: Dr Arvilla Meres Dr Marca Ancona Dr. Marcos Eke, NP Robbie Lis, [place] [facility] [place], [place] Brynda Peon, NP Karle Plumber, PharmD   Please be sure to bring in all your medications bottles to every appointment.    Thank you for choosing [facility]  If you have any questions or concerns before your next appointment please send [place] a message through [place] or call our office at [phone].    TO LEAVE A MESSAGE FOR THE NURSE SELECT OPTION 2, PLEASE  LEAVE A MESSAGE INCLUDING: YOUR NAME DATE OF BIRTH CALL BACK NUMBER REASON FOR CALL**this is important as we prioritize the call backs  YOU WILL RECEIVE A CALL BACK THE SAME DAY AS LONG AS YOU CALL BEFORE 4:00 PM

## 2023-07-03 NOTE — Progress Notes (Signed)
Patient Care Team: Deatra James, MD as PCP - General (Family Medicine) Chilton Si, MD as PCP - Cardiology (Cardiology)   HPI  Rachael Davis is a 67 y.o. female seen in follow-up for an ICD 7/23 for secondary prevention in the setting of ischemic heart disease and a mitral valve bioprosthesis.  When she was seen however she was noted to have significant PVCs and worsening symptoms since discontinuation of Entresto      Not been able to tolerate Entresto or Spyro because of hyperkalemia Lokelma was cost prohibitive\\  The patient denies chest pain, nocturnal dyspnea, orthopnea or peripheral edema.  There have been no palpitations, lightheadedness or syncope..  mild shortness of breath,   DATE TEST EF    5/18 LHC   3v CAD  3/19 Echo   35-40 %    1/23 Echo   25-30 %    3/23 Myoview  17% Fixed defect  5/23 PYP  Non diagnostic  5/23 LHC  LIMA-LADp;  SVG-RCA; SVG-Ramus occluded  5/24 Echo  25-30%     Date Cr K Hgb  2/23 1.4 5.5    6/23 1.33 4.1 11.2  10/23 1.27 5.1 11.9  4/24 1.6 4.3      Records and Results Reviewed   Past Medical History:  Diagnosis Date   Arrhythmia    Hx PAF, PVC's   CHF (congestive heart failure) (HCC)    Coronary artery disease    04/04/2017 STEMI   Diabetes [facility])    H/O mitral valve replacement 09/21/2021   Heart attack (HCC)    per pt reprot   Hyperlipidemia    Hypertension    New onset atrial fibrillation (HCC) 05/2017    Past Surgical History:  Procedure Laterality Date   CORONARY ARTERY BYPASS GRAFT N/A 04/12/2017   Procedure: CORONARY ARTERY BYPASS GRAFTING (CABG), ON PUMP, TIMES FOUR, USING LEFT INTERNAL MAMMARY ARTERY AND ENDOSCOPICALLY HARVESTED RIGHT GREATER SAPHENOUS VEIN;  Surgeon: Kerin Perna, MD;  Location: [facility] OR;  Service: Open Heart Surgery;  Laterality: N/A;  LIMA to LAD; SVG to OM; SVG to Ramus; SVG to Right   ICD IMPLANT N/A 06/30/2022   Procedure: ICD IMPLANT;  Surgeon: Duke Salvia, MD;  Location: [facility]  INVASIVE CV LAB;  Service: Cardiovascular;  Laterality: N/A;   IR THORACENTESIS ASP PLEURAL SPACE W/IMG GUIDE  05/25/2017   LEFT HEART CATH AND CORONARY ANGIOGRAPHY N/A 04/04/2017   Procedure: Left Heart Cath and Coronary Angiography;  Surgeon: Peter M [name], MD;  Location: [facility] INVASIVE CV LAB;  Service: Cardiovascular;  Laterality: N/A;   MITRAL VALVE REPAIR N/A 04/12/2017   Procedure: MITRAL VALVE REPLACEMENT (MVR);  Surgeon: Donata Clay, Theron Arista, MD;  Location: [facility] OR;  Service: Open Heart Surgery;  Laterality: N/A;  Using a 25mm Magna Ease Mitral Pericardial Bioprosthesis   RIGHT/LEFT HEART CATH AND CORONARY/GRAFT ANGIOGRAPHY N/A 04/20/2022   Procedure: RIGHT/LEFT HEART CATH AND CORONARY/GRAFT ANGIOGRAPHY;  Surgeon: Dolores Patty, MD;  Location: [facility] INVASIVE CV LAB;  Service: Cardiovascular;  Laterality: N/A;   TEE WITHOUT CARDIOVERSION N/A 04/12/2017   Procedure: TRANSESOPHAGEAL ECHOCARDIOGRAM (TEE);  Surgeon: Donata Clay, Theron Arista, MD;  Location: [facility] OR;  Service: Open Heart Surgery;  Laterality: N/A;   TUBAL LIGATION      Current Meds  Medication Sig   acetaminophen (TYLENOL) 325 MG tablet Take 2 tablets (650 mg total) by mouth every 6 (six) hours as needed for mild pain or headache.   aspirin EC 81 MG  tablet Take 81 mg by mouth daily.   atorvastatin (LIPITOR) 40 MG tablet TAKE 1 TABLET BY MOUTH DAILY AT 6 PM.   carvedilol (COREG) 6.25 MG tablet Take 1 tablet (6.25 mg total) by mouth 2 (two) times daily.   dapagliflozin propanediol (FARXIGA) 10 MG TABS tablet Take 10 mg by mouth daily.   DM-APAP-CPM (CORICIDIN HBP PO) Take 2 tablets by mouth daily as needed (congestion).   ENTRESTO 24-26 MG TAKE 1 TABLET BY MOUTH TWICE A DAY   furosemide (LASIX) 80 MG tablet Take 1 tablet (80 mg total) by mouth daily. On Saturdays and Sundays take 80 mg twice daily   glipiZIDE (GLUCOTROL XL) 10 MG 24 hr tablet Take 1 tablet (10 mg total) by mouth daily with breakfast.   hydrALAZINE (APRESOLINE) 25 MG tablet TAKE  1/2 OF A TABLET (12.5 MG TOTAL) BY MOUTH 3 TIMES A DAY   isosorbide mononitrate (IMDUR) 30 MG 24 hr tablet TAKE 1/2 OF A TABLET (15 MG TOTAL) BY MOUTH DAILY   metFORMIN (GLUCOPHAGE) 1000 MG tablet Take 1,000 mg by mouth 2 (two) times daily with a meal.   potassium chloride SA (KLOR-CON M) 20 MEQ tablet Take 1 tablet (20 mEq total) by mouth daily. On Saturdays and Sundays take 40 meq   RYBELSUS 7 MG TABS 1 tablet at least 30 minutes before first food, beverage or other oral medicine of the day Orally Once a day for 90 days   warfarin (COUMADIN) 5 MG tablet TAKE 1 TABLET BY MOUTH DAILY OR AS DIRECTED BY COUMADIN CLINIC    Allergies  Allergen Reactions   Keflex [Cephalexin] Rash    Skin on feet peeled.   Asa [Aspirin]     32 5 mg ----heartburn Pt can take 81 mg   Lisinopril Cough      Review of Systems negative except from HPI and PMH  Physical Exam BP 122/80   Pulse 89   Ht 5\' 3"  (1.6 m)   Wt 189 lb 9.6 oz (86 kg)   SpO2 98%   BMI 33.59 kg/m  Well developed and well nourished in no acute distress HENT normal Neck supple with JVP-flat Clear Device pocket well healed; without hematoma or erythema.  There is no tethering  Regular rate and rhythm, no  murmur Abd-soft with active BS No Clubbing cyanosis  edema Skin-warm and dry A & Oriented  Grossly normal sensory and motor function  ECG sinus at 87 Intervals 35/13/45 Right bundle branch block  Device function is normal. Programming changes none  See Paceart for details    Estimated Creatinine Clearance: 39.4 mL/min (A) (by C-G formula based on SCr of 1.42 mg/dL (H)).   Assessment and  Plan  Ischemic cardiomyopathy  Ventricular tachycardia-sustained   Congestive heart failure-chronic-systolic class IIb-IIIa  Right bundle branch block// 1AVB   Abnormal ECG with progressive conduction system disease   Atrial fibrillation-remote (last note that I can see is 6/18 shortly after her surgery)   Hypertension    Hyperkalemia precluding ACE/ARB/ASNI/MRA   PVCs   No symptoms of ischemia.  Continue aspirin and statins.  No interval ventricular tachycardia continue carvedilol  Progressive conduction system disease and a complex cardiomyopathy.  Were unable to get an MRI because of claustrophobia.  Will get a PET scan to make sure she does not have underlying sarcoidosis  No interval atrial fibrillation which we are aware  Blood pressure is well-controlled and guideline directed therapy for her cardiomyopathy   hydralazine and the isosorbide and  carvedilol.  Marland Kitchen

## 2023-07-04 ENCOUNTER — Ambulatory Visit (INDEPENDENT_AMBULATORY_CARE_PROVIDER_SITE_OTHER): Payer: No Typology Code available for payment source

## 2023-07-04 DIAGNOSIS — I440 Atrioventricular block, first degree: Secondary | ICD-10-CM | POA: Diagnosis not present

## 2023-07-04 LAB — CUP PACEART REMOTE DEVICE CHECK
Battery Remaining Longevity: 174 mo
Battery Remaining Percentage: 100 %
Brady Statistic RV Percent Paced: 0 %
Date Time Interrogation Session: 20240731020100
HighPow Impedance: 59 Ohm
Implantable Lead Connection Status: 753985
Implantable Lead Implant Date: 20230728
Implantable Lead Location: 753860
Implantable Lead Model: 138
Implantable Lead Serial Number: 303552
Implantable Pulse Generator Implant Date: 20230728
Lead Channel Impedance Value: 486 Ohm
Lead Channel Pacing Threshold Amplitude: 0.7 V
Lead Channel Pacing Threshold Pulse Width: 0.4 ms
Lead Channel Setting Pacing Amplitude: 2.5 V
Lead Channel Setting Pacing Pulse Width: 0.4 ms
Lead Channel Setting Sensing Sensitivity: 0.6 mV
Pulse Gen Serial Number: 217674

## 2023-07-05 ENCOUNTER — Telehealth (HOSPITAL_COMMUNITY): Payer: Self-pay

## 2023-07-05 DIAGNOSIS — I5022 Chronic systolic (congestive) heart failure: Secondary | ICD-10-CM

## 2023-07-05 MED ORDER — FUROSEMIDE 80 MG PO TABS
ORAL_TABLET | ORAL | 3 refills | Status: DC
Start: 2023-07-05 — End: 2023-12-21

## 2023-07-05 MED ORDER — POTASSIUM CHLORIDE CRYS ER 20 MEQ PO TBCR
EXTENDED_RELEASE_TABLET | ORAL | 11 refills | Status: AC
Start: 2023-07-05 — End: ?

## 2023-07-05 NOTE — Telephone Encounter (Signed)
-----   Message from [place] sent at 07/03/2023  4:52 PM EDT ----- Labs ok but BNP very elevated  Increase Lasix to 80 mg q am and add 40 mg q pm.   Increase KCL to 40 daily.. Repeat BMET in 10 days

## 2023-07-05 NOTE — Telephone Encounter (Signed)
Patient's medications has been changed and her med list up dated. In addition, her lab order placed and appointment scheduled. Pt aware, agreeable, and verbalized understanding.

## 2023-07-09 ENCOUNTER — Ambulatory Visit: Payer: No Typology Code available for payment source | Attending: Cardiovascular Disease

## 2023-07-09 DIAGNOSIS — I480 Paroxysmal atrial fibrillation: Secondary | ICD-10-CM

## 2023-07-09 DIAGNOSIS — Z951 Presence of aortocoronary bypass graft: Secondary | ICD-10-CM | POA: Diagnosis not present

## 2023-07-09 DIAGNOSIS — Z7901 Long term (current) use of anticoagulants: Secondary | ICD-10-CM | POA: Diagnosis not present

## 2023-07-09 LAB — POCT INR: INR: 3.5 — AB (ref 2.0–3.0)

## 2023-07-09 NOTE — Patient Instructions (Signed)
HOLD TONIGHT ONLY THEN Continue 1 tablet (5mg ) daily.  Recheck INR in 4 weeks.  Coumadin Clinic  [phone]

## 2023-07-16 ENCOUNTER — Other Ambulatory Visit (HOSPITAL_COMMUNITY): Payer: No Typology Code available for payment source

## 2023-07-19 NOTE — Progress Notes (Signed)
Remote ICD transmission.   

## 2023-07-25 ENCOUNTER — Other Ambulatory Visit (HOSPITAL_COMMUNITY): Payer: Self-pay | Admitting: Internal Medicine

## 2023-08-09 ENCOUNTER — Ambulatory Visit: Payer: No Typology Code available for payment source

## 2023-08-12 ENCOUNTER — Other Ambulatory Visit (HOSPITAL_COMMUNITY): Payer: Self-pay | Admitting: Internal Medicine

## 2023-08-16 ENCOUNTER — Ambulatory Visit: Payer: No Typology Code available for payment source | Attending: Internal Medicine

## 2023-08-16 DIAGNOSIS — Z7901 Long term (current) use of anticoagulants: Secondary | ICD-10-CM | POA: Diagnosis not present

## 2023-08-16 DIAGNOSIS — Z951 Presence of aortocoronary bypass graft: Secondary | ICD-10-CM

## 2023-08-16 LAB — POCT INR: INR: 1.8 — AB (ref 2.0–3.0)

## 2023-08-16 NOTE — Patient Instructions (Signed)
Description   Take 1.5 tablets daily and then continue 1 tablet (5mg ) daily.  Recheck INR in 4 weeks.  Coumadin Clinic  [phone]

## 2023-09-05 ENCOUNTER — Ambulatory Visit (HOSPITAL_COMMUNITY): Admission: RE | Admit: 2023-09-05 | Payer: No Typology Code available for payment source | Source: Ambulatory Visit

## 2023-09-13 ENCOUNTER — Ambulatory Visit: Payer: No Typology Code available for payment source

## 2023-09-21 ENCOUNTER — Ambulatory Visit: Payer: No Typology Code available for payment source | Attending: Internal Medicine | Admitting: *Deleted

## 2023-09-21 DIAGNOSIS — Z951 Presence of aortocoronary bypass graft: Secondary | ICD-10-CM | POA: Diagnosis not present

## 2023-09-21 DIAGNOSIS — Z5181 Encounter for therapeutic drug level monitoring: Secondary | ICD-10-CM | POA: Diagnosis not present

## 2023-09-21 DIAGNOSIS — Z7901 Long term (current) use of anticoagulants: Secondary | ICD-10-CM

## 2023-09-21 LAB — POCT INR: POC INR: 4.6

## 2023-09-21 NOTE — Patient Instructions (Signed)
Description   Hold warfarin today and tomorrow then continue taking 1 tablet (5mg ) daily.  Recheck INR in 2 weeks.  Coumadin Clinic  [phone]

## 2023-09-24 ENCOUNTER — Telehealth (HOSPITAL_COMMUNITY): Payer: Self-pay | Admitting: *Deleted

## 2023-09-24 NOTE — Telephone Encounter (Signed)
Patient returning call about her upcoming cardiac imaging study; pt verbalizes understanding of appt date/time, parking situation and where to check in, pre-test NPO status and verified current allergies; name and call back number provided for further questions should they arise  Larey Brick RN Navigator Cardiac Imaging [facility] [phone] office [phone] cell  Patient verbalized understanding of diet prep.

## 2023-09-24 NOTE — Telephone Encounter (Signed)
Attempted to call patient regarding upcoming cardiac PET sarcoid appointment. Left message on voicemail with name and callback number  Larey Brick RN Navigator Cardiac Imaging [facility] [phone] Office [phone] Cell

## 2023-09-26 ENCOUNTER — Encounter (HOSPITAL_COMMUNITY)
Admission: RE | Admit: 2023-09-26 | Discharge: 2023-09-26 | Disposition: A | Discharge status: Home / Self Care | Payer: No Typology Code available for payment source | Source: Ambulatory Visit | Attending: Internal Medicine | Admitting: Internal Medicine

## 2023-09-26 DIAGNOSIS — I255 Ischemic cardiomyopathy: Secondary | ICD-10-CM | POA: Diagnosis present

## 2023-09-26 DIAGNOSIS — I5022 Chronic systolic (congestive) heart failure: Secondary | ICD-10-CM | POA: Diagnosis present

## 2023-09-26 DIAGNOSIS — D8685 Sarcoid myocarditis: Secondary | ICD-10-CM | POA: Insufficient documentation

## 2023-09-26 LAB — NM PET CT MYOCARDIAL SARCOIDOSIS
LV dias vol: 214 mL (ref 46–106)
LV sys vol: 168 mL
Nuc Stress EF: 21 %
Rest Nuclear Isotope Dose: 2202 mCi

## 2023-09-26 MED ORDER — RUBIDIUM RB82 GENERATOR (RUBYFILL)
22.1800 | PACK | Freq: Once | INTRAVENOUS | Status: AC
  Administered 2023-09-26: 22.18 via INTRAVENOUS

## 2023-09-26 MED ORDER — FLUDEOXYGLUCOSE F - 18 (FDG) INJECTION
8.9700 | Freq: Once | INTRAVENOUS | Status: AC
  Administered 2023-09-26: 8.97 via INTRAVENOUS

## 2023-10-03 ENCOUNTER — Ambulatory Visit (INDEPENDENT_AMBULATORY_CARE_PROVIDER_SITE_OTHER): Payer: No Typology Code available for payment source

## 2023-10-03 DIAGNOSIS — I440 Atrioventricular block, first degree: Secondary | ICD-10-CM | POA: Diagnosis not present

## 2023-10-03 LAB — CUP PACEART REMOTE DEVICE CHECK
Battery Remaining Longevity: 174 mo
Battery Remaining Percentage: 100 %
Brady Statistic RV Percent Paced: 0 %
Date Time Interrogation Session: 20241030020100
HighPow Impedance: 58 Ohm
Implantable Lead Connection Status: 753985
Implantable Lead Implant Date: 20230728
Implantable Lead Location: 753860
Implantable Lead Model: 138
Implantable Lead Serial Number: 303552
Implantable Pulse Generator Implant Date: 20230728
Lead Channel Impedance Value: 460 Ohm
Lead Channel Pacing Threshold Amplitude: 0.7 V
Lead Channel Pacing Threshold Pulse Width: 0.4 ms
Lead Channel Setting Pacing Amplitude: 2.5 V
Lead Channel Setting Pacing Pulse Width: 0.4 ms
Lead Channel Setting Sensing Sensitivity: 0.6 mV
Pulse Gen Serial Number: 217674

## 2023-10-05 ENCOUNTER — Ambulatory Visit: Payer: No Typology Code available for payment source | Attending: Cardiovascular Disease

## 2023-10-12 ENCOUNTER — Ambulatory Visit: Payer: No Typology Code available for payment source | Attending: Cardiovascular Disease | Admitting: *Deleted

## 2023-10-12 DIAGNOSIS — I480 Paroxysmal atrial fibrillation: Secondary | ICD-10-CM | POA: Diagnosis not present

## 2023-10-12 DIAGNOSIS — Z7901 Long term (current) use of anticoagulants: Secondary | ICD-10-CM | POA: Diagnosis not present

## 2023-10-12 DIAGNOSIS — Z951 Presence of aortocoronary bypass graft: Secondary | ICD-10-CM

## 2023-10-12 LAB — POCT INR: INR: 1.9 — AB (ref 2.0–3.0)

## 2023-10-12 NOTE — Patient Instructions (Signed)
Description   Today take 1.5 tablets of warfarin then continue taking 1 tablet (5mg ) daily.  Recheck INR in 2 weeks.  Coumadin Clinic  [phone]

## 2023-10-22 NOTE — Progress Notes (Signed)
Remote ICD transmission.   

## 2023-10-23 ENCOUNTER — Ambulatory Visit
Admission: RE | Admit: 2023-10-23 | Discharge: 2023-10-23 | Disposition: A | Discharge status: Home / Self Care | Payer: No Typology Code available for payment source | Source: Ambulatory Visit | Attending: Family Medicine | Admitting: Family Medicine

## 2023-10-23 DIAGNOSIS — E2839 Other primary ovarian failure: Secondary | ICD-10-CM

## 2023-10-23 DIAGNOSIS — Z1231 Encounter for screening mammogram for malignant neoplasm of breast: Secondary | ICD-10-CM

## 2023-10-30 ENCOUNTER — Telehealth: Payer: Self-pay

## 2023-10-30 ENCOUNTER — Telehealth: Payer: Self-pay | Admitting: Internal Medicine

## 2023-10-30 ENCOUNTER — Telehealth: Payer: Self-pay | Admitting: Cardiovascular Disease

## 2023-10-30 DIAGNOSIS — D8685 Sarcoid myocarditis: Secondary | ICD-10-CM

## 2023-10-30 DIAGNOSIS — I255 Ischemic cardiomyopathy: Secondary | ICD-10-CM

## 2023-10-30 DIAGNOSIS — I493 Ventricular premature depolarization: Secondary | ICD-10-CM

## 2023-10-30 DIAGNOSIS — I5022 Chronic systolic (congestive) heart failure: Secondary | ICD-10-CM

## 2023-10-30 NOTE — Telephone Encounter (Signed)
Alert received from [employer] for Device alert Antitachycardia pacing (ATP) therapy delivered to convert arrhythmia. Event occurred 11/25 @ 07:45, duration 7sec, mean HR 136, morphology favors SVT.  ATP delivered 5x 8 NSVT events since last transmission. HL=16 Route to triage high alert per protocol.   Consulting with Joey from [employer] to confirm if EGM's are SVT and APT X5. Curious as to why if episode was SVT was ATP delivered. Morphology in episode appears the same to episode. Awaiting f/u message to review further.

## 2023-10-30 NOTE — Telephone Encounter (Signed)
-----   Message from Sherryl Manges sent at 10/29/2023 10:39 AM EST ----- Please Inform Patient that -PET   is abnormal-- unfortunately the test was inadequate preparation for excluding the diagnosis of sarcoid Cindee Lame  do you thnk repeat scan with better prep would be more useful Thanks

## 2023-10-30 NOTE — Telephone Encounter (Signed)
Attempted phone call to pt and left voicemail to contact office at [phone].

## 2023-10-30 NOTE — Telephone Encounter (Signed)
*  STAT* If patient is at the pharmacy, call can be transferred to refill team.   1. Which medications need to be refilled? (please list name of each medication and dose if known) atorvastatin (LIPITOR) 40 MG tablet    2. Would you like to learn more about the convenience, safety, & potential cost savings by using the [facility]?    3. Are you open to using the [facility] (Type [facility].  ).   4. Which pharmacy/location (including street and city if local pharmacy) is medication to be sent to? [id number] - [place], [place] - [address]    5. Do they need a 30 day or 90 day supply? 90

## 2023-10-30 NOTE — Telephone Encounter (Signed)
Patient called back to talk with Dr. Graciela Husbands or nurse in regards to PET

## 2023-10-31 ENCOUNTER — Other Ambulatory Visit: Payer: Self-pay

## 2023-10-31 ENCOUNTER — Telehealth: Payer: Self-pay | Admitting: Internal Medicine

## 2023-10-31 MED ORDER — ATORVASTATIN CALCIUM 40 MG PO TABS: 40.0000 mg | ORAL_TABLET | Freq: Every day | ORAL | 2 refills | Status: DC

## 2023-10-31 NOTE — Telephone Encounter (Signed)
Reviewed with Dr. Graciela Husbands and patient received inappropriate ATP. Patient needs device clinic apt for reprogramming.   -Change Rhythm Match from 94% to 85%. -Increase Ventricular Rate Detection from 135 bpm to 150 bpm. -Reprogram ATP Zone 1 to 8 beats of burst at 60 seconds.   Spoke to [place], [employer] rep who is aware and able to assist with changes.   Attempted to contact patient, no answer. LMTCB. Left detailed message on VM per DPR. Peyton Najjar, pts spouse phone number is the same.

## 2023-10-31 NOTE — Telephone Encounter (Signed)
Attempted phone call to pt.  OK per [employer] to leave detailed message.  Pt advised per Dr Graciela Husbands PET scan is abnormal and sarcoid cannot be rulled out due to inadequate test preparation.  Dr Graciela Husbands has reached out to his colleague Dr Eden Emms to see if it would be worthwhile to repeat scan with better prep.  Will notify pt once we have confirmation.  Advised to keep appointment scheduled for ICD check on 11/08/2023.  Call [phone] for any further questions or concerns.

## 2023-10-31 NOTE — Telephone Encounter (Signed)
 Rx sent to requested Pharmacy.

## 2023-10-31 NOTE — Telephone Encounter (Signed)
Device Apt made 11/08/23. This was the soonest patient could come in. I offered device clinic apt today, patient declined.

## 2023-10-31 NOTE — Telephone Encounter (Signed)
Attempted phone call to pt and left voicemail message to contact RN at [phone].

## 2023-10-31 NOTE — Telephone Encounter (Signed)
Pt returning nurses phone call. Please advise ?

## 2023-10-31 NOTE — Telephone Encounter (Signed)
This has been addressed in another  encounter.  Please see that encounter for complete information.

## 2023-11-08 ENCOUNTER — Ambulatory Visit: Payer: No Typology Code available for payment source | Attending: Cardiovascular Disease

## 2023-11-08 DIAGNOSIS — I255 Ischemic cardiomyopathy: Secondary | ICD-10-CM

## 2023-11-08 LAB — CUP PACEART INCLINIC DEVICE CHECK
Date Time Interrogation Session: 20241205112517
Implantable Lead Connection Status: 753985
Implantable Lead Implant Date: 20230728
Implantable Lead Location: 753860
Implantable Lead Model: 138
Implantable Lead Serial Number: 303552
Implantable Pulse Generator Implant Date: 20230728
Lead Channel Setting Pacing Amplitude: 2.5 V
Lead Channel Setting Pacing Pulse Width: 0.4 ms
Lead Channel Setting Sensing Sensitivity: 0.6 mV
Pulse Gen Serial Number: 217674

## 2023-11-08 NOTE — Progress Notes (Signed)
Patient noted to have had 1 inappropriate episode of ATP, suggested changes per Dr. Graciela Husbands made to device with assistance from [facility] rep.   Programming changes made:  - Rhythm match threshold changed from 94% to 85% - VT 1 ATP 1 initial pulses per burst increased from 4 to 8 with number of bursts 3 - Added VT 1 ATP 2 same scheme - Increased VT 1 ATP timeout from 60 sec to 90 sec - VT 1 detection rate increased from 135 bpm to 150bpm

## 2023-11-09 ENCOUNTER — Ambulatory Visit: Payer: No Typology Code available for payment source

## 2023-11-15 NOTE — Telephone Encounter (Signed)
-----   Message from Sherryl Manges sent at 10/29/2023 10:39 AM EST ----- Please Inform Patient that -PET   is abnormal-- unfortunately the test was inadequate preparation for excluding the diagnosis of sarcoid Cindee Lame  do you thnk repeat scan with better prep would be more useful Thanks

## 2023-11-15 NOTE — Addendum Note (Signed)
Addended by: Alois Cliche on: 11/15/2023 05:58 PM   Modules accepted: Orders

## 2023-11-15 NOTE — Telephone Encounter (Signed)
Spoke with Rachael Davis and advised pr recommendation per Dr Graciela Husbands and Dr Eden Emms.  Rachael Davis states she is willing to repeat test only if we can get pre-approval from insurance company.  Will submit to pre-auth to assist with this.

## 2023-11-16 ENCOUNTER — Ambulatory Visit: Payer: No Typology Code available for payment source | Attending: Cardiology

## 2023-11-16 DIAGNOSIS — Z7901 Long term (current) use of anticoagulants: Secondary | ICD-10-CM | POA: Diagnosis not present

## 2023-11-16 DIAGNOSIS — Z951 Presence of aortocoronary bypass graft: Secondary | ICD-10-CM

## 2023-11-16 LAB — POCT INR: INR: 3.2 — AB (ref 2.0–3.0)

## 2023-11-16 NOTE — Patient Instructions (Signed)
Description   Only take 1/2 tablet today of warfarin then continue taking 1 tablet (5mg ) daily.  Recheck INR in 4 weeks.  Coumadin Clinic  [phone]

## 2023-12-14 ENCOUNTER — Ambulatory Visit: Payer: No Typology Code available for payment source

## 2023-12-17 ENCOUNTER — Emergency Department (HOSPITAL_COMMUNITY): Payer: No Typology Code available for payment source

## 2023-12-17 ENCOUNTER — Inpatient Hospital Stay (HOSPITAL_COMMUNITY)
Admission: EM | Admit: 2023-12-17 | Discharge: 2023-12-21 | DRG: 291 | Disposition: A | Discharge status: Home / Self Care | Payer: No Typology Code available for payment source | Attending: Internal Medicine | Admitting: Internal Medicine

## 2023-12-17 ENCOUNTER — Other Ambulatory Visit: Payer: Self-pay

## 2023-12-17 ENCOUNTER — Encounter (HOSPITAL_COMMUNITY): Payer: Self-pay | Admitting: Emergency Medicine

## 2023-12-17 DIAGNOSIS — I255 Ischemic cardiomyopathy: Secondary | ICD-10-CM | POA: Diagnosis present

## 2023-12-17 DIAGNOSIS — I493 Ventricular premature depolarization: Secondary | ICD-10-CM | POA: Diagnosis present

## 2023-12-17 DIAGNOSIS — Z951 Presence of aortocoronary bypass graft: Secondary | ICD-10-CM

## 2023-12-17 DIAGNOSIS — Z7982 Long term (current) use of aspirin: Secondary | ICD-10-CM | POA: Diagnosis not present

## 2023-12-17 DIAGNOSIS — Z91148 Patient's other noncompliance with medication regimen for other reason: Secondary | ICD-10-CM | POA: Diagnosis not present

## 2023-12-17 DIAGNOSIS — E785 Hyperlipidemia, unspecified: Secondary | ICD-10-CM | POA: Diagnosis present

## 2023-12-17 DIAGNOSIS — I252 Old myocardial infarction: Secondary | ICD-10-CM | POA: Diagnosis not present

## 2023-12-17 DIAGNOSIS — Z7984 Long term (current) use of oral hypoglycemic drugs: Secondary | ICD-10-CM

## 2023-12-17 DIAGNOSIS — I10 Essential (primary) hypertension: Secondary | ICD-10-CM | POA: Diagnosis not present

## 2023-12-17 DIAGNOSIS — N1831 Chronic kidney disease, stage 3a: Secondary | ICD-10-CM | POA: Diagnosis present

## 2023-12-17 DIAGNOSIS — I2510 Atherosclerotic heart disease of native coronary artery without angina pectoris: Secondary | ICD-10-CM | POA: Diagnosis present

## 2023-12-17 DIAGNOSIS — E66811 Obesity, class 1: Secondary | ICD-10-CM | POA: Diagnosis present

## 2023-12-17 DIAGNOSIS — Z803 Family history of malignant neoplasm of breast: Secondary | ICD-10-CM

## 2023-12-17 DIAGNOSIS — I480 Paroxysmal atrial fibrillation: Secondary | ICD-10-CM | POA: Diagnosis present

## 2023-12-17 DIAGNOSIS — I130 Hypertensive heart and chronic kidney disease with heart failure and stage 1 through stage 4 chronic kidney disease, or unspecified chronic kidney disease: Principal | ICD-10-CM | POA: Diagnosis present

## 2023-12-17 DIAGNOSIS — Z8249 Family history of ischemic heart disease and other diseases of the circulatory system: Secondary | ICD-10-CM

## 2023-12-17 DIAGNOSIS — Z79899 Other long term (current) drug therapy: Secondary | ICD-10-CM | POA: Diagnosis not present

## 2023-12-17 DIAGNOSIS — Z95810 Presence of automatic (implantable) cardiac defibrillator: Secondary | ICD-10-CM | POA: Diagnosis not present

## 2023-12-17 DIAGNOSIS — Z6835 Body mass index (BMI) 35.0-35.9, adult: Secondary | ICD-10-CM

## 2023-12-17 DIAGNOSIS — Z7901 Long term (current) use of anticoagulants: Secondary | ICD-10-CM

## 2023-12-17 DIAGNOSIS — Z953 Presence of xenogenic heart valve: Secondary | ICD-10-CM

## 2023-12-17 DIAGNOSIS — I5023 Acute on chronic systolic (congestive) heart failure: Secondary | ICD-10-CM | POA: Diagnosis present

## 2023-12-17 DIAGNOSIS — Z1152 Encounter for screening for COVID-19: Secondary | ICD-10-CM | POA: Diagnosis not present

## 2023-12-17 DIAGNOSIS — I509 Heart failure, unspecified: Principal | ICD-10-CM

## 2023-12-17 DIAGNOSIS — E1122 Type 2 diabetes mellitus with diabetic chronic kidney disease: Secondary | ICD-10-CM | POA: Diagnosis present

## 2023-12-17 DIAGNOSIS — R0603 Acute respiratory distress: Secondary | ICD-10-CM | POA: Diagnosis present

## 2023-12-17 DIAGNOSIS — J9601 Acute respiratory failure with hypoxia: Secondary | ICD-10-CM

## 2023-12-17 DIAGNOSIS — T501X6A Underdosing of loop [high-ceiling] diuretics, initial encounter: Secondary | ICD-10-CM | POA: Diagnosis present

## 2023-12-17 DIAGNOSIS — E1121 Type 2 diabetes mellitus with diabetic nephropathy: Secondary | ICD-10-CM | POA: Diagnosis not present

## 2023-12-17 DIAGNOSIS — I4510 Unspecified right bundle-branch block: Secondary | ICD-10-CM | POA: Diagnosis present

## 2023-12-17 DIAGNOSIS — E1169 Type 2 diabetes mellitus with other specified complication: Secondary | ICD-10-CM | POA: Diagnosis present

## 2023-12-17 DIAGNOSIS — R0609 Other forms of dyspnea: Secondary | ICD-10-CM | POA: Diagnosis not present

## 2023-12-17 LAB — I-STAT VENOUS BLOOD GAS, ED
Acid-Base Excess: 1 mmol/L (ref 0.0–2.0)
Bicarbonate: 21.5 mmol/L (ref 20.0–28.0)
Calcium, Ion: 1.06 mmol/L — ABNORMAL LOW (ref 1.15–1.40)
HCT: 40 % (ref 36.0–46.0)
Hemoglobin: 13.6 g/dL (ref 12.0–15.0)
O2 Saturation: 96 %
Potassium: 4.4 mmol/L (ref 3.5–5.1)
Sodium: 139 mmol/L (ref 135–145)
TCO2: 22 mmol/L (ref 22–32)
pCO2, Ven: 24.7 mm[Hg] — ABNORMAL LOW (ref 44–60)
pH, Ven: 7.548 — ABNORMAL HIGH (ref 7.25–7.43)
pO2, Ven: 66 mm[Hg] — ABNORMAL HIGH (ref 32–45)

## 2023-12-17 LAB — RESP PANEL BY RT-PCR (RSV, FLU A&B, COVID)  RVPGX2
Influenza A by PCR: NEGATIVE
Influenza B by PCR: NEGATIVE
Resp Syncytial Virus by PCR: NEGATIVE
SARS Coronavirus 2 by RT PCR: NEGATIVE

## 2023-12-17 LAB — COMPREHENSIVE METABOLIC PANEL
ALT: 33 U/L (ref 0–44)
AST: 34 U/L (ref 15–41)
Albumin: 3.5 g/dL (ref 3.5–5.0)
Alkaline Phosphatase: 127 U/L — ABNORMAL HIGH (ref 38–126)
Anion gap: 10 (ref 5–15)
BUN: 23 mg/dL (ref 8–23)
CO2: 22 mmol/L (ref 22–32)
Calcium: 9.1 mg/dL (ref 8.9–10.3)
Chloride: 108 mmol/L (ref 98–111)
Creatinine, Ser: 1.17 mg/dL — ABNORMAL HIGH (ref 0.44–1.00)
GFR, Estimated: 51 mL/min — ABNORMAL LOW (ref >60)
Glucose, Bld: 226 mg/dL — ABNORMAL HIGH (ref 70–99)
Potassium: 4.3 mmol/L (ref 3.5–5.1)
Sodium: 140 mmol/L (ref 135–145)
Total Bilirubin: 0.9 mg/dL (ref 0.0–1.2)
Total Protein: 7.2 g/dL (ref 6.5–8.1)

## 2023-12-17 LAB — BRAIN NATRIURETIC PEPTIDE: B Natriuretic Peptide: 3413.2 pg/mL — ABNORMAL HIGH (ref 0.0–100.0)

## 2023-12-17 LAB — PROTIME-INR
INR: 2 — ABNORMAL HIGH (ref 0.8–1.2)
Prothrombin Time: 22.6 s — ABNORMAL HIGH (ref 11.4–15.2)

## 2023-12-17 LAB — CBC WITH DIFFERENTIAL/PLATELET
Abs Immature Granulocytes: 0.01 10*3/uL (ref 0.00–0.07)
Basophils Absolute: 0.1 10*3/uL (ref 0.0–0.1)
Basophils Relative: 1 %
Eosinophils Absolute: 0.1 10*3/uL (ref 0.0–0.5)
Eosinophils Relative: 1 %
HCT: 41.1 % (ref 36.0–46.0)
Hemoglobin: 12.1 g/dL (ref 12.0–15.0)
Immature Granulocytes: 0 %
Lymphocytes Relative: 20 %
Lymphs Abs: 1.2 10*3/uL (ref 0.7–4.0)
MCH: 21.6 pg — ABNORMAL LOW (ref 26.0–34.0)
MCHC: 29.4 g/dL — ABNORMAL LOW (ref 30.0–36.0)
MCV: 73.4 fL — ABNORMAL LOW (ref 80.0–100.0)
Monocytes Absolute: 0.5 10*3/uL (ref 0.1–1.0)
Monocytes Relative: 9 %
Neutro Abs: 3.9 10*3/uL (ref 1.7–7.7)
Neutrophils Relative %: 69 %
Platelets: 180 10*3/uL (ref 150–400)
RBC: 5.6 MIL/uL — ABNORMAL HIGH (ref 3.87–5.11)
RDW: 20.4 % — ABNORMAL HIGH (ref 11.5–15.5)
WBC: 5.7 10*3/uL (ref 4.0–10.5)
nRBC: 0 % (ref 0.0–0.2)

## 2023-12-17 LAB — TROPONIN I (HIGH SENSITIVITY)
Troponin I (High Sensitivity): 142 ng/L (ref <18)
Troponin I (High Sensitivity): 137 ng/L (ref <18)

## 2023-12-17 LAB — HIV ANTIBODY (ROUTINE TESTING W REFLEX): HIV Screen 4th Generation wRfx: NONREACTIVE

## 2023-12-17 LAB — GLUCOSE, CAPILLARY
Glucose-Capillary: 171 mg/dL — ABNORMAL HIGH (ref 70–99)
Glucose-Capillary: 201 mg/dL — ABNORMAL HIGH (ref 70–99)

## 2023-12-17 MED ORDER — CARVEDILOL 3.125 MG PO TABS
6.2500 mg | ORAL_TABLET | Freq: Two times a day (BID) | ORAL | Status: DC
Start: 2023-12-17 — End: 2023-12-19
  Administered 2023-12-17 – 2023-12-19 (×4): 6.25 mg via ORAL
  Filled 2023-12-17 (×4): qty 2

## 2023-12-17 MED ORDER — HYDRALAZINE HCL 25 MG PO TABS
12.5000 mg | ORAL_TABLET | Freq: Three times a day (TID) | ORAL | Status: DC
  Administered 2023-12-17 – 2023-12-21 (×12): 12.5 mg via ORAL
  Filled 2023-12-17 (×12): qty 1

## 2023-12-17 MED ORDER — ASPIRIN 81 MG PO TBEC
81.0000 mg | DELAYED_RELEASE_TABLET | Freq: Every day | ORAL | Status: DC
  Administered 2023-12-17 – 2023-12-21 (×5): 81 mg via ORAL
  Filled 2023-12-17 (×5): qty 1

## 2023-12-17 MED ORDER — ONDANSETRON HCL 4 MG PO TABS: 4.0000 mg | ORAL_TABLET | Freq: Four times a day (QID) | ORAL | Status: DC | PRN

## 2023-12-17 MED ORDER — FUROSEMIDE 10 MG/ML IJ SOLN
40.0000 mg | Freq: Two times a day (BID) | INTRAMUSCULAR | Status: DC
  Administered 2023-12-18: 40 mg via INTRAVENOUS
  Filled 2023-12-17: qty 4

## 2023-12-17 MED ORDER — GLIPIZIDE ER 10 MG PO TB24
10.0000 mg | ORAL_TABLET | Freq: Every day | ORAL | Status: DC
  Administered 2023-12-18: 10 mg via ORAL
  Filled 2023-12-17: qty 1

## 2023-12-17 MED ORDER — ATORVASTATIN CALCIUM 40 MG PO TABS
40.0000 mg | ORAL_TABLET | Freq: Every day | ORAL | Status: DC
  Administered 2023-12-17 – 2023-12-21 (×5): 40 mg via ORAL
  Filled 2023-12-17 (×5): qty 1

## 2023-12-17 MED ORDER — DAPAGLIFLOZIN PROPANEDIOL 10 MG PO TABS
10.0000 mg | ORAL_TABLET | Freq: Every day | ORAL | Status: DC
  Administered 2023-12-17 – 2023-12-21 (×5): 10 mg via ORAL
  Filled 2023-12-17 (×5): qty 1

## 2023-12-17 MED ORDER — NITROGLYCERIN IN D5W 200-5 MCG/ML-% IV SOLN
0.0000 ug/min | INTRAVENOUS | Status: DC
  Administered 2023-12-17: 5 ug/min via INTRAVENOUS
  Filled 2023-12-17: qty 250

## 2023-12-17 MED ORDER — ISOSORBIDE MONONITRATE ER 30 MG PO TB24
15.0000 mg | ORAL_TABLET | Freq: Every day | ORAL | Status: DC
  Administered 2023-12-17 – 2023-12-19 (×3): 15 mg via ORAL
  Filled 2023-12-17 (×3): qty 1

## 2023-12-17 MED ORDER — ACETAMINOPHEN 650 MG RE SUPP: 650.0000 mg | Freq: Four times a day (QID) | RECTAL | Status: DC | PRN

## 2023-12-17 MED ORDER — INSULIN ASPART 100 UNIT/ML IJ SOLN
0.0000 [IU] | Freq: Three times a day (TID) | INTRAMUSCULAR | Status: DC
Start: 2023-12-18 — End: 2023-12-21
  Administered 2023-12-18 (×2): 2 [IU] via SUBCUTANEOUS
  Administered 2023-12-19: 3 [IU] via SUBCUTANEOUS
  Administered 2023-12-19 (×2): 2 [IU] via SUBCUTANEOUS
  Administered 2023-12-20: 3 [IU] via SUBCUTANEOUS
  Administered 2023-12-20 – 2023-12-21 (×2): 2 [IU] via SUBCUTANEOUS

## 2023-12-17 MED ORDER — ONDANSETRON HCL 4 MG/2ML IJ SOLN: 4.0000 mg | Freq: Four times a day (QID) | INTRAMUSCULAR | Status: DC | PRN

## 2023-12-17 MED ORDER — WARFARIN - PHARMACIST DOSING INPATIENT: Freq: Every day | Status: DC

## 2023-12-17 MED ORDER — SACUBITRIL-VALSARTAN 24-26 MG PO TABS
1.0000 | ORAL_TABLET | Freq: Two times a day (BID) | ORAL | Status: DC
  Administered 2023-12-17 – 2023-12-20 (×6): 1 via ORAL
  Filled 2023-12-17 (×6): qty 1

## 2023-12-17 MED ORDER — INSULIN ASPART 100 UNIT/ML IJ SOLN
0.0000 [IU] | Freq: Every day | INTRAMUSCULAR | Status: DC
  Administered 2023-12-17: 2 [IU] via SUBCUTANEOUS

## 2023-12-17 MED ORDER — METFORMIN HCL 500 MG PO TABS
1000.0000 mg | ORAL_TABLET | Freq: Two times a day (BID) | ORAL | Status: DC
  Administered 2023-12-17 – 2023-12-18 (×2): 1000 mg via ORAL
  Filled 2023-12-17 (×2): qty 2

## 2023-12-17 MED ORDER — WARFARIN SODIUM 5 MG PO TABS
5.0000 mg | ORAL_TABLET | Freq: Once | ORAL | Status: AC
  Administered 2023-12-17: 5 mg via ORAL
  Filled 2023-12-17: qty 1

## 2023-12-17 MED ORDER — ALBUTEROL SULFATE HFA 108 (90 BASE) MCG/ACT IN AERS: 2.0000 | INHALATION_SPRAY | RESPIRATORY_TRACT | Status: DC | PRN

## 2023-12-17 MED ORDER — ACETAMINOPHEN 325 MG PO TABS
650.0000 mg | ORAL_TABLET | Freq: Four times a day (QID) | ORAL | Status: DC | PRN
  Filled 2023-12-17: qty 2

## 2023-12-17 MED ORDER — FUROSEMIDE 10 MG/ML IJ SOLN
80.0000 mg | Freq: Once | INTRAMUSCULAR | Status: AC
  Administered 2023-12-17: 80 mg via INTRAVENOUS
  Filled 2023-12-17: qty 8

## 2023-12-17 MED ORDER — SENNOSIDES-DOCUSATE SODIUM 8.6-50 MG PO TABS: 1.0000 | ORAL_TABLET | Freq: Every evening | ORAL | Status: DC | PRN

## 2023-12-17 MED ORDER — POTASSIUM CHLORIDE CRYS ER 20 MEQ PO TBCR
20.0000 meq | EXTENDED_RELEASE_TABLET | Freq: Every day | ORAL | Status: DC
  Administered 2023-12-17 – 2023-12-18 (×2): 20 meq via ORAL
  Filled 2023-12-17 (×2): qty 1

## 2023-12-17 NOTE — ED Notes (Signed)
 ED TO INPATIENT HANDOFF REPORT  ED Nurse Name and Phone #: Paulina [phone]  S Name/Age/Gender Rachael Davis Rides 68 y.o. female Room/Bed: 046C/046C  Code Status   Code Status: Full Code  Home/SNF/Other Home Patient oriented to: self, place, time, and situation Is this baseline? Yes   Triage Complete: Triage complete  Chief Complaint Acute exacerbation of congestive heart failure (HCC) [I50.9]  Triage Note Pt via POV reporting 4 weeks of cough, with right leg edema and SOB for the past few days. Pt is dyspneic with exertion. Denies CP. No warmth or redness to lower legs but pt notes she did have severe right popliteal pain a few nights ago which has since resolved. JVD noted at time of triage.    Allergies Allergies  Allergen Reactions   Keflex  [Cephalexin ] Rash    Skin on feet peeled.   Asa [Aspirin ]     325 mg ----heartburn Pt can take 81 mg   Lisinopril  Cough    Level of Care/Admitting Diagnosis ED Disposition     ED Disposition  Admit   Condition  --   Comment  Hospital Area: [facility] [[id number]]  Level of Care: Telemetry Medical [104]  May admit patient to [facility] or [facility] if equivalent level of care is available:: No  Covid Evaluation: Symptomatic Person Under Investigation (PUI) or recent exposure (last 10 days) *Testing Required*  Diagnosis: Acute exacerbation of congestive heart failure [facility]) [[id number]]  Admitting Physician: LOU CLARETTA HERO [[id number]]  Attending Physician: LOU CLARETTA HERO [[id number]]  Certification:: I certify this patient will need inpatient services for at least 2 midnights  Expected Medical Readiness: 12/19/2023          B Medical/Surgery History Past Medical History:  Diagnosis Date   Arrhythmia    Hx PAF, PVC's   CHF (congestive heart failure) (HCC)    Coronary artery disease    04/04/2017 STEMI   Diabetes [facility])    H/O mitral valve replacement 09/21/2021   Heart attack (HCC)    per pt reprot    Hyperlipidemia    Hypertension    New onset atrial fibrillation (HCC) 05/2017   Past Surgical History:  Procedure Laterality Date   CORONARY ARTERY BYPASS GRAFT N/A 04/12/2017   Procedure: CORONARY ARTERY BYPASS GRAFTING (CABG), ON PUMP, TIMES FOUR, USING LEFT INTERNAL MAMMARY ARTERY AND ENDOSCOPICALLY HARVESTED RIGHT GREATER SAPHENOUS VEIN;  Surgeon: Fleeta Hanford Coy, MD;  Location: [facility] OR;  Service: Open Heart Surgery;  Laterality: N/A;  LIMA to LAD; SVG to OM; SVG to Ramus; SVG to Right   ICD IMPLANT N/A 06/30/2022   Procedure: ICD IMPLANT;  Surgeon: Fernande Elspeth JAYSON, MD;  Location: [facility] INVASIVE CV LAB;  Service: Cardiovascular;  Laterality: N/A;   IR THORACENTESIS ASP PLEURAL SPACE W/IMG GUIDE  05/25/2017   LEFT HEART CATH AND CORONARY ANGIOGRAPHY N/A 04/04/2017   Procedure: Left Heart Cath and Coronary Angiography;  Surgeon: Peter M Jordan, MD;  Location: [facility] INVASIVE CV LAB;  Service: Cardiovascular;  Laterality: N/A;   MITRAL VALVE REPAIR N/A 04/12/2017   Procedure: MITRAL VALVE REPLACEMENT (MVR);  Surgeon: Fleeta Hanford, Coy, MD;  Location: [facility] OR;  Service: Open Heart Surgery;  Laterality: N/A;  Using a 25mm Magna Ease Mitral Pericardial Bioprosthesis   RIGHT/LEFT HEART CATH AND CORONARY/GRAFT ANGIOGRAPHY N/A 04/20/2022   Procedure: RIGHT/LEFT HEART CATH AND CORONARY/GRAFT ANGIOGRAPHY;  Surgeon: Cherrie Toribio SAUNDERS, MD;  Location: [facility] INVASIVE CV LAB;  Service: Cardiovascular;  Laterality: N/A;   TEE WITHOUT  CARDIOVERSION N/A 04/12/2017   Procedure: TRANSESOPHAGEAL ECHOCARDIOGRAM (TEE);  Surgeon: Fleeta Ochoa, Maude, MD;  Location: [facility] OR;  Service: Open Heart Surgery;  Laterality: N/A;   TUBAL LIGATION       A IV Location/Drains/Wounds Patient Lines/Drains/Airways Status     Active Line/Drains/Airways     Name Placement date Placement time Site Days   Peripheral IV 12/17/23 Anterior;Left;Proximal Forearm 12/17/23  1330  Forearm  less than 1   External Urinary Catheter 12/17/23  1353  --  less  than 1            Intake/Output Last 24 hours  Intake/Output Summary (Last 24 hours) at 12/17/2023 1740 Last data filed at 12/17/2023 1519 Gross per 24 hour  Intake --  Output 800 ml  Net -800 ml    Labs/Imaging Results for orders placed or performed during the hospital encounter of 12/17/23 (from the past 48 hours)  CBC with Differential     Status: Abnormal   Collection Time: 12/17/23 11:12 AM  Result Value Ref Range   WBC 5.7 4.0 - 10.5 K/uL   RBC 5.60 (H) 3.87 - 5.11 MIL/uL   Hemoglobin 12.1 12.0 - 15.0 g/dL   HCT 58.8 63.9 - 53.9 %   MCV 73.4 (L) 80.0 - 100.0 fL   MCH 21.6 (L) 26.0 - 34.0 pg   MCHC 29.4 (L) 30.0 - 36.0 g/dL   RDW 79.5 (H) 88.4 - 84.4 %   Platelets 180 150 - 400 K/uL    Comment: REPEATED TO VERIFY   nRBC 0.0 0.0 - 0.2 %   Neutrophils Relative % 69 %   Neutro Abs 3.9 1.7 - 7.7 K/uL   Lymphocytes Relative 20 %   Lymphs Abs 1.2 0.7 - 4.0 K/uL   Monocytes Relative 9 %   Monocytes Absolute 0.5 0.1 - 1.0 K/uL   Eosinophils Relative 1 %   Eosinophils Absolute 0.1 0.0 - 0.5 K/uL   Basophils Relative 1 %   Basophils Absolute 0.1 0.0 - 0.1 K/uL   Immature Granulocytes 0 %   Abs Immature Granulocytes 0.01 0.00 - 0.07 K/uL    Comment: Performed at [facility], [address], [place], [place] [address]  Comprehensive metabolic panel     Status: Abnormal   Collection Time: 12/17/23 11:12 AM  Result Value Ref Range   Sodium 140 135 - 145 mmol/L   Potassium 4.3 3.5 - 5.1 mmol/L   Chloride 108 98 - 111 mmol/L   CO2 22 22 - 32 mmol/L   Glucose, Bld 226 (H) 70 - 99 mg/dL    Comment: Glucose reference range applies only to samples taken after fasting for at least 8 hours.   BUN 23 8 - 23 mg/dL   Creatinine, Ser 8.82 (H) 0.44 - 1.00 mg/dL   Calcium  9.1 8.9 - 10.3 mg/dL   Total Protein 7.2 6.5 - 8.1 g/dL   Albumin  3.5 3.5 - 5.0 g/dL   AST 34 15 - 41 U/L   ALT 33 0 - 44 U/L   Alkaline Phosphatase 127 (H) 38 - 126 U/L   Total Bilirubin 0.9 0.0 - 1.2  mg/dL   GFR, Estimated 51 (L) >60 mL/min    Comment: (NOTE) Calculated using the CKD-EPI Creatinine Equation (2021)    Anion gap 10 5 - 15    Comment: Performed at [facility] Lab, [address] [place], [place] [address]  Troponin I (High Sensitivity)     Status: Abnormal  Collection Time: 12/17/23 11:12 AM  Result Value Ref Range   Troponin I (High Sensitivity) 142 (HH) <18 ng/L    Comment: CRITICAL RESULT CALLED TO, READ BACK BY AND VERIFIED WITH D GRAY RN 1233, 12/17/2023 OUROBANGNA S (NOTE) Elevated high sensitivity troponin I (hsTnI) values and significant  changes across serial measurements may suggest ACS but many other  chronic and acute conditions are known to elevate hsTnI results.  Refer to the Links section for chest pain algorithms and additional  guidance. Performed at [facility], [address] [place], [place] [address]   Brain natriuretic peptide     Status: Abnormal   Collection Time: 12/17/23 11:13 AM  Result Value Ref Range   B Natriuretic Peptide 3,413.2 (H) 0.0 - 100.0 pg/mL    Comment: Performed at [facility], [address] [place], [place] [address]  Troponin I (High Sensitivity)     Status: Abnormal   Collection Time: 12/17/23  1:13 PM  Result Value Ref Range   Troponin I (High Sensitivity) 137 (HH) <18 ng/L    Comment: CRITICAL VALUE NOTED. VALUE IS CONSISTENT WITH PREVIOUSLY REPORTED/CALLED VALUE (NOTE) Elevated high sensitivity troponin I (hsTnI) values and significant  changes across serial measurements may suggest ACS but many other  chronic and acute conditions are known to elevate hsTnI results.  Refer to the Links section for chest pain algorithms and additional  guidance. Performed at [facility], [address], [place], [place] [address]   Protime-INR     Status: Abnormal   Collection Time: 12/17/23  1:13 PM  Result Value Ref Range   Prothrombin Time 22.6 (H) 11.4 - 15.2 seconds   INR 2.0 (H) 0.8 - 1.2     Comment: (NOTE) INR goal varies based on device and disease states. Performed at [facility], [address] [place], [place] [address]   I-Stat venous blood gas, ED     Status: Abnormal   Collection Time: 12/17/23  1:50 PM  Result Value Ref Range   pH, Ven 7.548 (H) 7.25 - 7.43   pCO2, Ven 24.7 (L) 44 - 60 mmHg   pO2, Ven 66 (H) 32 - 45 mmHg   Bicarbonate 21.5 20.0 - 28.0 mmol/L   TCO2 22 22 - 32 mmol/L   O2 Saturation 96 %   Acid-Base Excess 1.0 0.0 - 2.0 mmol/L   Sodium 139 135 - 145 mmol/L   Potassium 4.4 3.5 - 5.1 mmol/L   Calcium , Ion 1.06 (L) 1.15 - 1.40 mmol/L   HCT 40.0 36.0 - 46.0 %   Hemoglobin 13.6 12.0 - 15.0 g/dL   Sample type VENOUS   Resp panel by RT-PCR (RSV, Flu A&B, Covid) Anterior Nasal Swab     Status: None   Collection Time: 12/17/23  2:33 PM   Specimen: Anterior Nasal Swab  Result Value Ref Range   SARS Coronavirus 2 by RT PCR NEGATIVE NEGATIVE   Influenza A by PCR NEGATIVE NEGATIVE   Influenza B by PCR NEGATIVE NEGATIVE    Comment: (NOTE) The Xpert Xpress SARS-CoV-2/FLU/RSV plus assay is intended as an aid in the diagnosis of influenza from Nasopharyngeal swab specimens and should not be used as a sole basis for treatment. Nasal washings and aspirates are unacceptable for Xpert Xpress SARS-CoV-2/FLU/RSV testing.  Fact Sheet for Patients: bloggercourse.com  Fact Sheet for Healthcare Providers: seriousbroker.it  This test is not yet approved or cleared by the [place]  FDA and has been  authorized for detection and/or diagnosis of SARS-CoV-2 by FDA under an Emergency Use Authorization (EUA). This EUA will remain in effect (meaning this test can be used) for the duration of the COVID-19 declaration under Section 564(b)(1) of the Act, 21 U.S.C. section 360bbb-3(b)(1), unless the authorization is terminated or revoked.     Resp Syncytial Virus by PCR NEGATIVE NEGATIVE    Comment:  (NOTE) Fact Sheet for Patients: bloggercourse.com  Fact Sheet for Healthcare Providers: seriousbroker.it  This test is not yet approved or cleared by the [place]  FDA and has been authorized for detection and/or diagnosis of SARS-CoV-2 by FDA under an Emergency Use Authorization (EUA). This EUA will remain in effect (meaning this test can be used) for the duration of the COVID-19 declaration under Section 564(b)(1) of the Act, 21 U.S.C. section 360bbb-3(b)(1), unless the authorization is terminated or revoked.  Performed at [facility] Lab, [address], [place], [place] [address]   HIV Antibody (routine testing w rflx)     Status: None   Collection Time: 12/17/23  3:26 PM  Result Value Ref Range   HIV Screen 4th Generation wRfx Non Reactive Non Reactive    Comment: Performed at [facility] Lab, [address] [place], [place] [address]   DG Chest 2 View Result Date: 12/17/2023 CLINICAL DATA:  Shortness of breath with cough and right leg edema for the past few days. EXAM: CHEST - 2 VIEW COMPARISON:  Radiographs 09/13/2022 and 06/30/2022. FINDINGS: Lower lung volumes, especially on the lateral view. The heart is enlarged status post median sternotomy, CABG and mitral valve replacement. Left subclavian AICD projects to the right ventricular apex. There is increased vascular congestion with possible mild edema and small bilateral pleural effusions. Probable mild left lower lobe atelectasis without consolidation. No evidence of pneumothorax. The bones appear unchanged. IMPRESSION: Cardiomegaly with increased vascular congestion and possible mild congestive heart failure. Electronically Signed   By: Elsie Perone M.D.   On: 12/17/2023 12:22    Pending Labs Unresulted Labs (From admission, onward)     Start     Ordered   12/18/23 0500  Protime-INR  Daily,   R      12/17/23 1630            Vitals/Pain Today's Vitals    12/17/23 1710 12/17/23 1715 12/17/23 1730 12/17/23 1739  BP: (!) 145/93 (!) 119/92 (!) 147/77   Pulse: 88 87 87   Resp: (!) 27 (!) 26 (!) 30   Temp:    98.3 F (36.8 C)  TempSrc:    Oral  SpO2: 98% 95% 98%   Weight:      Height:      PainSc:        Isolation Precautions No active isolations  Medications Medications  albuterol  (VENTOLIN  HFA) 108 (90 Base) MCG/ACT inhaler 2 puff (has no administration in time range)  nitroGLYCERIN  50 mg in dextrose  5 % 250 mL (0.2 mg/mL) infusion (110 mcg/min Intravenous Rate/Dose Change 12/17/23 1520)  acetaminophen  (TYLENOL ) tablet 650 mg (has no administration in time range)    Or  acetaminophen  (TYLENOL ) suppository 650 mg (has no administration in time range)  senna-docusate (Senokot-S) tablet 1 tablet (has no administration in time range)  ondansetron  (ZOFRAN ) tablet 4 mg (has no administration in time range)    Or  ondansetron  (ZOFRAN ) injection 4 mg (has no administration in time range)  Warfarin - Pharmacist Dosing Inpatient (has no administration in time range)  furosemide  (LASIX ) injection 80  mg (80 mg Intravenous Given 12/17/23 1350)  warfarin (COUMADIN ) tablet 5 mg (5 mg Oral Given 12/17/23 1739)    Mobility walks     Focused Assessments Pulmonary Assessment Handoff:  Lung sounds: Bilateral Breath Sounds: Diminished O2 Device: Room Air O2 Flow Rate (L/min): 2 L/min    R Recommendations: See Admitting Provider Note  Report given to:   Additional Notes:

## 2023-12-17 NOTE — ED Notes (Signed)
 Respiratory at bedside.

## 2023-12-17 NOTE — ED Notes (Signed)
 CCMD notified via telephone.

## 2023-12-17 NOTE — H&P (Signed)
 History and Physical    Patient: Rachael Davis FMW:[id number] DOB: [birth year] DOA: 12/17/2023 DOS: the patient was seen and examined on 12/17/2023 PCP: Sun, Vyvyan, MD  Patient coming from: Home  Chief Complaint:  Chief Complaint  Patient presents with   Shortness of Breath   Cough   HPI: Rachael Davis is a 68 y.o. female with medical history significant of CAD/NSTEMI s/p CABG, paroxysmal A-fib on warfarin, T2DM, mitral valve replacement, HFrEF, ischemic cardiomyopathy s/p ICD, HTN, and HLD who presented to the ED for evaluation of shortness of breath and leg swelling. 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 States she was informed by her cardiologist to take 1 pill of her Lasix  during the week and then 2 pills during the weekends. States she has not taken her Lasix  since last Friday. She has had progressive dyspnea on exertion and worsening shortness of breath over the last few days. She endorsed associated lower extremity swelling, orthopnea and cough but denies any dizziness, chest pain, nausea, vomiting, abdominal pain, fevers or chills.  She does report some headache after she was put on the nitro drip.  ED course: Initial vitals with temp 97.8, RR 22, HR 59, BP 159/86, SpO2 98% on room air Labs show WBC 5.7, Hgb 12.1, platelet 180, sodium 140, K+ 4.3, glucose 226, creatinine 1.17, troponin 142->137, BNP 3413, PT/INR 22.6/2.0, VBG shows pH 7.55, pCO2 24.7, pO2 66, bicarb 21.5, negative COVID, flu and RSV test EKG shows sinus or ectopic atrial rhythm and mildly prolonged QTc of 487 Chest x-ray shows cardiomegaly with increased vascular congestion Patient became tachypneic with drop in O2 sats so he was placed on 2 L [place]. She continued to be tachypneic so BiPAP was ordered however patient only tolerated briefly.  Patient was started on  a nitro drip and given IV Lasix  80 mg x 1 with significant improvement in her respiratory status. TRH consulted for admission   Review of Systems: As mentioned in the history of present illness. All other systems reviewed and are negative. Past Medical History:  Diagnosis Date   Arrhythmia    Hx PAF, PVC's   CHF (congestive heart failure) (HCC)    Coronary artery disease    04/04/2017 STEMI   Diabetes [facility])    H/O mitral valve replacement 09/21/2021   Heart attack (HCC)    per pt reprot   Hyperlipidemia    Hypertension    New onset atrial fibrillation (HCC) 05/2017   Past Surgical History:  Procedure Laterality Date   CORONARY ARTERY BYPASS GRAFT N/A 04/12/2017   Procedure: CORONARY ARTERY BYPASS GRAFTING (CABG), ON PUMP, TIMES FOUR, USING LEFT INTERNAL MAMMARY ARTERY AND ENDOSCOPICALLY HARVESTED RIGHT GREATER SAPHENOUS VEIN;  Surgeon: Fleeta Hanford Coy, MD;  Location[facility];  Service: Open Heart Surgery;  Laterality: N/A;  LIMA to LAD; SVG to OM; SVG to Ramus; SVG to Right   ICD IMPLANT N/A 06/30/2022   Procedure: ICD IMPLANT;  Surgeon: Fernande Elspeth JAYSON, MD;  Location: [facility] INVASIVE CV LAB;  Service: Cardiovascular;  Laterality: N/A;   IR THORACENTESIS ASP PLEURAL SPACE W/IMG GUIDE  05/25/2017   LEFT HEART CATH AND CORONARY ANGIOGRAPHY N/A 04/04/2017   Procedure: Left Heart Cath and Coronary Angiography;  Surgeon: Peter M Jordan, MD;  Location: [facility] INVASIVE CV LAB;  Service: Cardiovascular;  Laterality: N/A;  MITRAL VALVE REPAIR N/A 04/12/2017   Procedure: MITRAL VALVE REPLACEMENT (MVR);  Surgeon: Fleeta Ochoa, Maude, MD;  Location: [facility] OR;  Service: Open Heart Surgery;  Laterality: N/A;  Using a 25mm Magna Ease Mitral Pericardial Bioprosthesis   RIGHT/LEFT HEART CATH AND CORONARY/GRAFT ANGIOGRAPHY N/A 04/20/2022   Procedure: RIGHT/LEFT HEART CATH AND CORONARY/GRAFT ANGIOGRAPHY;  Surgeon: Cherrie Toribio SAUNDERS, MD;  Location: [facility] INVASIVE CV LAB;  Service: Cardiovascular;  Laterality: N/A;   TEE  WITHOUT CARDIOVERSION N/A 04/12/2017   Procedure: TRANSESOPHAGEAL ECHOCARDIOGRAM (TEE);  Surgeon: Fleeta Ochoa, Maude, MD;  Location[facility] OR;  Service: Open Heart Surgery;  Laterality: N/A;   TUBAL LIGATION     Social History:  reports that she has never smoked. She has never used smokeless tobacco. She reports current drug use. Drug: Marijuana. She reports that she does not drink alcohol.  Allergies  Allergen Reactions   Keflex  [Cephalexin ] Rash    Skin on feet peeled.   Asa [Aspirin ]     325 mg ----heartburn Pt can take 81 mg   Lisinopril  Cough    Family History  Problem Relation Age of Onset   Heart attack Father 79   Breast cancer Maternal Grandmother     Prior to Admission medications   Medication Sig Start Date End Date Taking? Authorizing Provider  acetaminophen  (TYLENOL ) 325 MG tablet Take 2 tablets (650 mg total) by mouth every 6 (six) hours as needed for mild pain or headache. 05/28/17   Dwan Kyla HERO, PA-C  aspirin  EC 81 MG tablet Take 81 mg by mouth daily.    [provider]  atorvastatin  (LIPITOR ) 40 MG tablet Take 1 tablet (40 mg total) by mouth daily. 10/31/23   Raford Riggs, MD  carvedilol  (COREG ) 6.25 MG tablet Take 1 tablet (6.25 mg total) by mouth 2 (two) times daily. 06/20/23   Raford Riggs, MD  dapagliflozin  propanediol (FARXIGA ) 10 MG TABS tablet Take 10 mg by mouth daily.    [provider]  DM-APAP-CPM (CORICIDIN HBP PO) Take 2 tablets by mouth daily as needed (congestion).    [provider]  ENTRESTO  24-26 MG TAKE 1 TABLET BY MOUTH TWICE A DAY 07/25/23   Bensimhon, Toribio SAUNDERS, MD  furosemide  (LASIX ) 80 MG tablet Take 1 tablet (80 mg total) by mouth every morning AND 0.5 tablets (40 mg total) every evening. 07/05/23   Glena Harlene HERO, FNP  glipiZIDE  (GLUCOTROL  XL) 10 MG 24 hr tablet Take 1 tablet (10 mg total) by mouth daily with breakfast. 05/28/17   Dwan Kyla M, PA-C  hydrALAZINE  (APRESOLINE ) 25 MG tablet TAKE  1/2 OF A TABLET (12.5 MG TOTAL) BY MOUTH 3 TIMES A DAY 08/13/23   Bensimhon, Toribio SAUNDERS, MD  isosorbide  mononitrate (IMDUR ) 30 MG 24 hr tablet TAKE 1/2 OF A TABLET (15 MG TOTAL) BY MOUTH DAILY 02/26/23   Bensimhon, Toribio SAUNDERS, MD  metFORMIN  (GLUCOPHAGE ) 1000 MG tablet Take 1,000 mg by mouth 2 (two) times daily with a meal.    [provider]  potassium chloride  SA (KLOR-CON  M) 20 MEQ tablet Take 2 tablets by mouth daily. 07/05/23   [place], Harlene HERO, FNP  RYBELSUS 7 MG TABS 1 tablet at least 30 minutes before first food, beverage or other oral medicine of the day Orally Once a day for 90 days 06/13/23   [provider]  warfarin (COUMADIN ) 5 MG tablet TAKE 1 TABLET BY MOUTH DAILY OR AS DIRECTED BY COUMADIN  CLINIC 03/26/23   Raford Riggs, MD  Physical Exam: Vitals:   12/17/23 1710 12/17/23 1715 12/17/23 1730 12/17/23 1739  BP: (!) 145/93 (!) 119/92 (!) 147/77   Pulse: 88 87 87   Resp: (!) 27 (!) 26 (!) 30   Temp:    98.3 F (36.8 C)  TempSrc:    Oral  SpO2: 98% 95% 98%   Weight:      Height:       General: Pleasant, well-appearing obese woman laying in bed. No acute distress. HEENT: [place]/AT. Anicteric sclera Neck: Supple. JVD to the jawline. CV: Mild tachycardic.  Regular rhythm. No murmurs, rubs, or gallops. 2+ BLE edema up to mid calf. Pulmonary: Lungs CTAB. Normal effort. No wheezing. Diffuse rales. Abdominal: Soft, nontender, nondistended. Normal bowel sounds. Extremities: Palpable radial and DP pulses. Normal ROM. Skin: Warm and dry. No obvious rash or lesions. Neuro: A&Ox3. Moves all extremities. Normal sensation to light touch. No focal deficit. Psych: Normal mood and affect  Data Reviewed: Labs show WBC 5.7, Hgb 12.1, platelet 180, sodium 140, K+ 4.3, glucose 226, creatinine 1.17, troponin 142->137, BNP 3413, PT/INR 22.6/2.0, VBG shows pH 7.55, pCO2 24.7, pO2 66, bicarb 21.5, negative COVID, flu and RSV test. EKG shows sinus or ectopic atrial rhythm and mildly  prolonged QTc of 487 Chest x-ray shows cardiomegaly with increased vascular congestion  Assessment and Plan: ROSSIE SCARFONE is a 68 y.o. female with medical history significant of CAD/NSTEMI s/p CABG x4, paroxysmal A-fib on warfarin, T2DM, mitral valve replacement, HFrEF, ischemic cardiomyopathy s/p ICD, HTN, and HLD who presented to the ED for evaluation of shortness of breath and leg swelling and admitted for CHF exacerbation.  # Acute exacerbation of congestive heart failure # Acute on chronic systolic heart failure Last TTE in May 2024 showed EF 25-30%, LV global hypokinesis, moderately elevated PASP, moderately dilated LA, mitral valve prosthesis without significant regurg or stenosis. Patient presented with worsening shortness of breath and progressive dyspnea on exertion to have laboratory, clinical and radiological findings of CHF exacerbation. This is in the setting of medication noncompliance as patient has not taken his Lasix  for the last 2-3 days. Patient counseled on importance of taking her Lasix  daily to avoid further hospitalizations. Respiratory status significantly improved with IV Lasix  80 mg x 1 and nitro drip in the ED. She is currently on room air with O2 sats in the mid 90s. Admission weight of 193 lbs, unknown dry weight. She has had 1.6 L of urine output out so far. -Admit to telemetry bed -Start IV Lasix  40 mg twice daily -Resume GDMT with Coreg , Entresto  and Farxiga  -Continue home KCl 20 mEq daily -Discontinue nitro drip -Strict I&O's, daily weights -Trend and replete electrolytes -Supplemental O2 as needed  # Paroxysmal A-fib # PVCs EKG shows sinus or ectopic atrial rhythm with mildly prolonged QTc of 487. Mild tachycardia but regular rhythm on heart auscultation. INR 2.0 on admission. -Pharmacy consulted to assist with warfarin dosing, appreciate assistance -Continue Coreg  -Telemetry  # T2DM Last A1c 6.7% 1 year ago. Blood glucose of 226 on CMP. -Continue  glipizide , Farxiga  and metformin  -SSI with meals and at bedtime, CBG monitoring -Follow-up repeat A1c  # HTN BP significantly elevated with SBP in the 150s to 180s. Patient started on nitro drip with improvement of SBP down to the 140s. -Resume home Imdur , hydralazine , Coreg  and Entresto  -Discontinue nitro drip  # CAD/NSTEMI s/p CABG x4 # HLD Lipid panel 3 years ago showed LDL of 68 -Continue Imdur , atorvastatin  and aspirin  -Follow-up repeat lipid  panel  # Mitral valve regurg s/p mitral valve replacement Bioprosthetic MVR. Mean gradient 8 mmHg on echo 04/2023  -Continue warfarin  # Obesity Body mass index is 35.3 kg/m. -Follow-up with PCP in the outpatient for nutrition and weight loss counseling   Advance Care Planning:   Code Status: Full Code   Consults: None  Family Communication: Discussed admission with spouse at bedside  Severity of Illness: The appropriate patient status for this patient is INPATIENT. Inpatient status is judged to be reasonable and necessary in order to provide the required intensity of service to ensure the patient's safety. The patient's presenting symptoms, physical exam findings, and initial radiographic and laboratory data in the context of their chronic comorbidities is felt to place them at high risk for further clinical deterioration. Furthermore, it is not anticipated that the patient will be medically stable for discharge from the hospital within 2 midnights of admission.   * I certify that at the point of admission it is my clinical judgment that the patient will require inpatient hospital care spanning beyond 2 midnights from the point of admission due to high intensity of service, high risk for further deterioration and high frequency of surveillance required.*  Author: Claretta CHRISTELLA Alderman, MD 12/17/2023 3:16 PM  For on call review [email or web address].

## 2023-12-17 NOTE — ED Notes (Signed)
 Pt is becoming more short of breath at this time. O2 at 2LPM [place] initiated. Tachypneic in the 40s. Provider is aware and ordered bipap. RT is at bedside.

## 2023-12-17 NOTE — Progress Notes (Signed)
 Trialed pt on bipap for approx before pt needed to remove mask. RT educated pt on why bipap is ordered. Pt agrees to wear bipap if her WOB increases. Bedside RN and Paramedic instructed to call RT if pt WOB increases.

## 2023-12-17 NOTE — ED Triage Notes (Addendum)
 Pt via POV reporting 4 weeks of cough, with right leg edema and SOB for the past few days. Pt is dyspneic with exertion. Denies CP. No warmth or redness to lower legs but pt notes she did have severe right popliteal pain a few nights ago which has since resolved. JVD noted at time of triage.

## 2023-12-17 NOTE — ED Notes (Signed)
 Respiratory notified via telephone.

## 2023-12-17 NOTE — Progress Notes (Signed)
 PHARMACY - ANTICOAGULATION CONSULT NOTE  Pharmacy Consult for warfarin Indication: atrial fibrillation  Allergies  Allergen Reactions   Keflex  [Cephalexin ] Rash    Skin on feet peeled.   Asa [email or web address].[email or web address] ]     325 mg ----heartburn Pt can take 81 mg   Lisinopril  Cough    Patient Measurements: Height: 5' 2 (157.5 cm) Weight: 87.5 kg (193 lb) IBW/kg (Calculated) : 50.1   Vital Signs: Temp: 98 F (36.7 C) (01/13 1432) Temp Source: Oral (01/13 1432) BP: 177/98 (01/13 1455) Pulse Rate: 94 (01/13 1455)  Labs: Recent Labs    12/17/23 1112 12/17/23 1313 12/17/23 1350  HGB 12.1  --  13.6  HCT 41.1  --  40.0  PLT 180  --   --   LABPROT  --  22.6*  --   INR  --  2.0*  --   CREATININE 1.17*  --   --   TROPONINIHS 142* 137*  --     Estimated Creatinine Clearance: 47.3 mL/min (A) (by C-G formula based on SCr of 1.17 mg/dL (H)).   Medical History: Past Medical History:  Diagnosis Date   Arrhythmia    Hx PAF, PVC's   CHF (congestive heart failure) (HCC)    Coronary artery disease    04/04/2017 STEMI   Diabetes [facility]    H/O mitral valve replacement 09/21/2021   Heart attack (HCC)    per pt reprot   Hyperlipidemia    Hypertension    New onset atrial fibrillation (HCC) 05/2017    Assessment: 50 YOF presenting with SOB and cough, hx of afib on warfarin PTA, INR on admission is therapeutic at 2, last dose taken 1/12  PTA dosing: 5mg  daily  Goal of Therapy:  INR 2-3 Monitor platelets by anticoagulation protocol: Yes   Plan:  Give warfarin 5mg  PO x 1 today Daily INR, s/s bleeding  Dorn Poot, PharmD, [facility] Clinical Pharmacist ED Pharmacist Phone # [phone] 12/17/2023 4:29 PM

## 2023-12-17 NOTE — ED Notes (Signed)
 MD notified of result face to face.

## 2023-12-17 NOTE — ED Provider Notes (Signed)
 [place] EMERGENCY DEPARTMENT AT [facility] Provider Note   CSN: [id number] Arrival date & time: 12/17/23  1048     History  Chief Complaint  Patient presents with   Shortness of Breath   Cough    Rachael Davis is a 68 y.o. female history of STEMI, CABG x 4, defibrillator, CHF, chronic anticoagulation due to A-fib with warfarin presented with shortness of breath and productive cough.  Patient been coughing up clear sputum.  Patient denies fevers.  Patient denies chest pain.  Patient states that she has had 4 weeks of this cough with bilateral lower leg edema.  Patient reported they were just on the right lower leg but has since included the left lower leg.  Patient has dyspnea on exertion.  Patient denies any fevers or redness of the legs or wounds.  Last echo 04/17/2023: LVEF 25 to 30%, moderately elevated pulmonary artery systolic pressure, mild to moderate tricuspid valve regurg  Home Medications Prior to Admission medications   Medication Sig Start Date End Date Taking? Authorizing Provider  acetaminophen  (TYLENOL ) 325 MG tablet Take 2 tablets (650 mg total) by mouth every 6 (six) hours as needed for mild pain or headache. 05/28/17   Dwan Kyla HERO, PA-C  aspirin  EC 81 MG tablet Take 81 mg by mouth daily.    [provider]  atorvastatin  (LIPITOR ) 40 MG tablet Take 1 tablet (40 mg total) by mouth daily. 10/31/23   Raford Riggs, MD  carvedilol  (COREG ) 6.25 MG tablet Take 1 tablet (6.25 mg total) by mouth 2 (two) times daily. 06/20/23   Raford Riggs, MD  dapagliflozin  propanediol (FARXIGA ) 10 MG TABS tablet Take 10 mg by mouth daily.    [provider]  DM-APAP-CPM (CORICIDIN HBP PO) Take 2 tablets by mouth daily as needed (congestion).    [provider]  ENTRESTO  24-26 MG TAKE 1 TABLET BY MOUTH TWICE A DAY 07/25/23   Bensimhon, Toribio SAUNDERS, MD  furosemide  (LASIX ) 80 MG tablet Take 1 tablet (80 mg total) by mouth every morning AND  0.5 tablets (40 mg total) every evening. 07/05/23   Glena Harlene HERO, FNP  glipiZIDE  (GLUCOTROL  XL) 10 MG 24 hr tablet Take 1 tablet (10 mg total) by mouth daily with breakfast. 05/28/17   Dwan Kyla M, PA-C  hydrALAZINE  (APRESOLINE ) 25 MG tablet TAKE 1/2 OF A TABLET (12.5 MG TOTAL) BY MOUTH 3 TIMES A DAY 08/13/23   Bensimhon, Toribio SAUNDERS, MD  isosorbide  mononitrate (IMDUR ) 30 MG 24 hr tablet TAKE 1/2 OF A TABLET (15 MG TOTAL) BY MOUTH DAILY 02/26/23   Bensimhon, Toribio SAUNDERS, MD  metFORMIN  (GLUCOPHAGE ) 1000 MG tablet Take 1,000 mg by mouth 2 (two) times daily with a meal.    [provider]  potassium chloride  SA (KLOR-CON  M) 20 MEQ tablet Take 2 tablets by mouth daily. 07/05/23   [place], Harlene HERO, FNP  RYBELSUS 7 MG TABS 1 tablet at least 30 minutes before first food, beverage or other oral medicine of the day Orally Once a day for 90 days 06/13/23   [provider]  warfarin (COUMADIN ) 5 MG tablet TAKE 1 TABLET BY MOUTH DAILY OR AS DIRECTED BY COUMADIN  CLINIC 03/26/23   Raford Riggs, MD      Allergies    Keflex  [cephalexin ], Asa [aspirin ], and Lisinopril     Review of Systems   Review of Systems  Respiratory:  Positive for cough and shortness of breath.     Physical Exam Updated Vital Signs  BP (!) 159/86 (BP Location: Right Arm)   Pulse (!) 59   Temp 97.8 F (36.6 C)   Resp (!) 22   Ht 5' 2 (1.575 m)   Wt 87.5 kg   SpO2 98%   BMI 35.30 kg/m  Physical Exam Constitutional:      General: She is in acute distress.  Neck:     Vascular: JVD present.  Cardiovascular:     Rate and Rhythm: Normal rate and regular rhythm.     Pulses: Normal pulses.     Heart sounds: Normal heart sounds.  Pulmonary:     Effort: Respiratory distress present.     Breath sounds: Rhonchi present.     Comments: Able to speak in full sentences Musculoskeletal:     Right lower leg: No tenderness. No edema.     Left lower leg: No tenderness. No edema.  Skin:    General: Skin is warm  and dry.  Neurological:     Mental Status: She is alert.  Psychiatric:        Mood and Affect: Mood normal.     ED Results / Procedures / Treatments   Labs (all labs ordered are listed, but only abnormal results are displayed) Labs Reviewed  CBC WITH DIFFERENTIAL/PLATELET - Abnormal; Notable for the following components:      Result Value   RBC 5.60 (*)    MCV 73.4 (*)    MCH 21.6 (*)    MCHC 29.4 (*)    RDW 20.4 (*)    All other components within normal limits  COMPREHENSIVE METABOLIC PANEL - Abnormal; Notable for the following components:   Glucose, Bld 226 (*)    Creatinine, Ser 1.17 (*)    Alkaline Phosphatase 127 (*)    GFR, Estimated 51 (*)    All other components within normal limits  BRAIN NATRIURETIC PEPTIDE - Abnormal; Notable for the following components:   B Natriuretic Peptide 3,413.2 (*)    All other components within normal limits  TROPONIN I (HIGH SENSITIVITY) - Abnormal; Notable for the following components:   Troponin I (High Sensitivity) 142 (*)    All other components within normal limits  RESP PANEL BY RT-PCR (RSV, FLU A&B, COVID)  RVPGX2  PROTIME-INR  TROPONIN I (HIGH SENSITIVITY)    EKG EKG Interpretation Date/Time:  Monday December 17 2023 11:05:48 EST Ventricular Rate:  120 PR Interval:    QRS Duration:  128 QT Interval:  362 QTC Calculation: 511 R Axis:   180  Text Interpretation: Wide QRS rhythm with frequent Premature ventricular complexes Right bundle branch block Left posterior fascicular block Bifascicular block T wave abnormality, consider inferolateral ischemia Abnormal ECG When compared with ECG of 03-Jul-2023 14:47, PREVIOUS ECG IS PRESENT old RBBB. diffuse ST depression compared to previous. artifact inferior leads Confirmed by Armenta Canning [phone]) on 12/17/2023 11:14:31 AM  Radiology DG Chest 2 View Result Date: 12/17/2023 CLINICAL DATA:  Shortness of breath with cough and right leg edema for the past few days. EXAM: CHEST - 2  VIEW COMPARISON:  Radiographs 09/13/2022 and 06/30/2022. FINDINGS: Lower lung volumes, especially on the lateral view. The heart is enlarged status post median sternotomy, CABG and mitral valve replacement. Left subclavian AICD projects to the right ventricular apex. There is increased vascular congestion with possible mild edema and small bilateral pleural effusions. Probable mild left lower lobe atelectasis without consolidation. No evidence of pneumothorax. The bones appear unchanged. IMPRESSION: Cardiomegaly with increased vascular congestion and possible mild congestive heart  failure. Electronically Signed   By: Elsie Perone M.D.   On: 12/17/2023 12:22    Procedures Ultrasound ED Echo  Date/Time: 12/17/2023 1:54 PM  Performed by: Rachael Lynwood DASEN, PA-C Authorized by: Rachael Lynwood DASEN, PA-C   Procedure details:    Indications: dyspnea     Views: subxiphoid, apical 4 chamber view and IVC view     Images: archived     Limitations:  Body habitus Findings:    Pericardium: no pericardial effusion     Cardiac Activity: hyperdynamic     LV Function: severly depressed (<30%)     RV Diameter: normal     IVC: normal   Impression:    Impression: decreased contractility   .Critical Care  Performed by: Rachael Lynwood DASEN, PA-C Authorized by: Rachael Lynwood DASEN, PA-C   Critical care provider statement:    Critical care time (minutes):  40   Critical care time was exclusive of:  Separately billable procedures and treating other patients   Critical care was necessary to treat or prevent imminent or life-threatening deterioration of the following conditions:  Respiratory failure   Critical care was time spent personally by me on the following activities:  Blood draw for specimens, development of treatment plan with patient or surrogate, discussions with consultants, evaluation of patient's response to treatment, examination of patient, obtaining history from patient or surrogate, review of old  charts, re-evaluation of patient's condition, pulse oximetry, ordering and review of radiographic studies, ordering and review of laboratory studies and ordering and performing treatments and interventions   I assumed direction of critical care for this patient from another provider in my specialty: no     Care discussed with: admitting provider       Medications Ordered in ED Medications  albuterol  (VENTOLIN  HFA) 108 (90 Base) MCG/ACT inhaler 2 puff (has no administration in time range)    ED Course/ Medical Decision Making/ A&P                                 Medical Decision Making Amount and/or Complexity of Data Reviewed Labs: ordered. Radiology: ordered.  Risk Prescription drug management.   Rachael Davis 68 y.o. presented today for shortness of breath.  Working DDx that I considered at this time includes, but not limited to, CHF, respiratory distress, asthma/COPD exacerbation, URI, viral illness, anemia, ACS, PE, pneumonia, pleural effusion, lung cancer.  R/o DDx: asthma/COPD exacerbation, URI, viral illness, anemia, ACS, PE, pneumonia, pleural effusion, lung cancer: These are considered less likely due to history of present illness, physical exam, labs/imaging findings  Review of prior external notes: 11/08/2023 clinical support  Unique Tests and My Interpretation:  CBC: Unremarkable CMP: Unremarkable EKG: Sinus 90 bpm, right bundle branch block noted, no ST elevations or depressions noted Troponin: 142, 137 CXR: Cardiomegaly noted with vascular congestion PT/INR: Unremarkable I-STAT VBG: Respiratory panel: Respiratory alkalosis BNP: 3413.2  Social Determinants of Health: none  Discussion with Independent Historian:  Husband  Discussion of Management of Tests:  [place], MD Hospitalist  Risk: High: hospitalization or escalation of hospital-level care  Risk Stratification Score: None  Staffed with Mannie, DO  Plan: On exam patient was in acute  respiratory distress.  Patient was unable to speak in full sentences and did have rhonchi bilaterally with edema bilaterally as well.  Patient states she has not been taking her Lasix  and so do suspect this is CHF given that her  BNP is elevated significantly.  Troponin is also elevated however this is most likely demand ischemia in the setting of respiratory distress.  Will place patient on 2 L nasal cannula.  Patient continued to desat while on 2 L nasal cannula and so we will place on BiPAP and give Lasix  along with nitro.  Patient's PT/INR is unremarkable and given that she has bilateral leg swelling with clear diagnosis as to her symptoms do not feel she needs CTA at this time for PE.  Once patient is more stable we will consult hospitalist for admission.  On reevaluation patient was able to speak more clearly after receiving the nitro and Lasix  and does not appear to be in distress.  RT went to evaluate the patient has not started him on BiPAP and so has them on room air as patient is not hypoxic anymore and states that they will come back to reevaluate.  At this time patient is stable to be admitted to the hospitalist and so hospitalist was consulted.  I spoke to the hospitalist and patient was accepted for admission.  Patient stable to be admitted.  This chart was dictated using voice recognition software.  Despite best efforts to proofread,  errors can occur which can change the documentation meaning.         Final Clinical Impression(s) / ED Diagnoses Final diagnoses:  None    Rx / DC Orders ED Discharge Orders     None         Rachael Lynwood ONEIDA DEVONNA 12/17/23 1501    Mannie Pac T, DO 12/18/23 1830

## 2023-12-18 DIAGNOSIS — E1121 Type 2 diabetes mellitus with diabetic nephropathy: Secondary | ICD-10-CM

## 2023-12-18 DIAGNOSIS — Z951 Presence of aortocoronary bypass graft: Secondary | ICD-10-CM

## 2023-12-18 DIAGNOSIS — I10 Essential (primary) hypertension: Secondary | ICD-10-CM | POA: Diagnosis not present

## 2023-12-18 DIAGNOSIS — E66811 Obesity, class 1: Secondary | ICD-10-CM

## 2023-12-18 DIAGNOSIS — I5023 Acute on chronic systolic (congestive) heart failure: Secondary | ICD-10-CM | POA: Diagnosis not present

## 2023-12-18 DIAGNOSIS — N1831 Chronic kidney disease, stage 3a: Secondary | ICD-10-CM | POA: Diagnosis not present

## 2023-12-18 DIAGNOSIS — I480 Paroxysmal atrial fibrillation: Secondary | ICD-10-CM

## 2023-12-18 LAB — GLUCOSE, CAPILLARY
Glucose-Capillary: 143 mg/dL — ABNORMAL HIGH (ref 70–99)
Glucose-Capillary: 144 mg/dL — ABNORMAL HIGH (ref 70–99)
Glucose-Capillary: 111 mg/dL — ABNORMAL HIGH (ref 70–99)
Glucose-Capillary: 117 mg/dL — ABNORMAL HIGH (ref 70–99)

## 2023-12-18 LAB — PROTIME-INR
INR: 2 — ABNORMAL HIGH (ref 0.8–1.2)
Prothrombin Time: 22.8 s — ABNORMAL HIGH (ref 11.4–15.2)

## 2023-12-18 LAB — BASIC METABOLIC PANEL
Anion gap: 10 (ref 5–15)
BUN: 22 mg/dL (ref 8–23)
CO2: 24 mmol/L (ref 22–32)
Calcium: 9.1 mg/dL (ref 8.9–10.3)
Chloride: 107 mmol/L (ref 98–111)
Creatinine, Ser: 1.18 mg/dL — ABNORMAL HIGH (ref 0.44–1.00)
GFR, Estimated: 50 mL/min — ABNORMAL LOW (ref >60)
Glucose, Bld: 151 mg/dL — ABNORMAL HIGH (ref 70–99)
Potassium: 4.1 mmol/L (ref 3.5–5.1)
Sodium: 141 mmol/L (ref 135–145)

## 2023-12-18 LAB — LIPID PANEL
Cholesterol: 132 mg/dL (ref 0–200)
HDL: 39 mg/dL — ABNORMAL LOW (ref >40)
LDL Cholesterol: 81 mg/dL (ref 0–99)
Total CHOL/HDL Ratio: 3.4 {ratio}
Triglycerides: 59 mg/dL (ref <150)
VLDL: 12 mg/dL (ref 0–40)

## 2023-12-18 LAB — HEMOGLOBIN A1C
Hgb A1c MFr Bld: 7.9 % — ABNORMAL HIGH (ref 4.8–5.6)
Mean Plasma Glucose: 180.03 mg/dL

## 2023-12-18 LAB — CBC
HCT: 39.1 % (ref 36.0–46.0)
Hemoglobin: 11.7 g/dL — ABNORMAL LOW (ref 12.0–15.0)
MCH: 21.5 pg — ABNORMAL LOW (ref 26.0–34.0)
MCHC: 29.9 g/dL — ABNORMAL LOW (ref 30.0–36.0)
MCV: 71.9 fL — ABNORMAL LOW (ref 80.0–100.0)
Platelets: 193 10*3/uL (ref 150–400)
RBC: 5.44 MIL/uL — ABNORMAL HIGH (ref 3.87–5.11)
RDW: 20.2 % — ABNORMAL HIGH (ref 11.5–15.5)
WBC: 6.9 10*3/uL (ref 4.0–10.5)
nRBC: 0 % (ref 0.0–0.2)

## 2023-12-18 LAB — MAGNESIUM: Magnesium: 1.9 mg/dL (ref 1.7–2.4)

## 2023-12-18 LAB — PHOSPHORUS: Phosphorus: 3.1 mg/dL (ref 2.5–4.6)

## 2023-12-18 MED ORDER — MAGNESIUM SULFATE 2 GM/50ML IV SOLN
2.0000 g | Freq: Once | INTRAVENOUS | Status: AC
  Administered 2023-12-18: 2 g via INTRAVENOUS
  Filled 2023-12-18: qty 50

## 2023-12-18 MED ORDER — FUROSEMIDE 10 MG/ML IJ SOLN
60.0000 mg | Freq: Two times a day (BID) | INTRAMUSCULAR | Status: DC
  Administered 2023-12-18 – 2023-12-20 (×4): 60 mg via INTRAVENOUS
  Filled 2023-12-18 (×4): qty 6

## 2023-12-18 MED ORDER — WARFARIN SODIUM 5 MG PO TABS
5.0000 mg | ORAL_TABLET | Freq: Once | ORAL | Status: AC
  Administered 2023-12-18: 5 mg via ORAL
  Filled 2023-12-18: qty 1

## 2023-12-18 MED ORDER — FUROSEMIDE 10 MG/ML IJ SOLN
20.0000 mg | Freq: Once | INTRAMUSCULAR | Status: AC
  Administered 2023-12-18: 20 mg via INTRAVENOUS
  Filled 2023-12-18: qty 2

## 2023-12-18 NOTE — Progress Notes (Signed)
 Heart Failure Navigator Progress Note  Assessed for Heart & Vascular TOC clinic readiness.  Patient does not meet criteria due to Advanced Heart Failure Team patient of Dr. Gala Romney. .   Navigator will sign off at this time.   Rhae Hammock, BSN, Scientist, clinical (histocompatibility and immunogenetics) Only

## 2023-12-18 NOTE — Assessment & Plan Note (Signed)
 Calculated BMI is 33. 9

## 2023-12-18 NOTE — Progress Notes (Addendum)
 Progress Note   Patient: Rachael Davis FMW:[id number] DOB: [birth year] DOA: 12/17/2023     1 DOS: the patient was seen and examined on 12/18/2023   Brief hospital course: Rachael Davis was admitted to the hospital with the working diagnosis of heart failure exacerbation.   68 yo female with the past medical history of coronary artery disease sp CABG, paroxysmal atrial fibrillation, T2DM, mitral valve replacement, hypertension and heart failure who presented with dyspnea and lower extremity edema. Reported few days of progressive dyspnea and lower extremity edema, associated with orthopnea and cough. She has not been compliant to furosemide  at home. On her initial physical examination her blood pressure was 159/86, HR 59, RR 22 and 02 saturation 98% on room, lungs with no wheezing or rhonchi, positive diffuse rales bilaterally, heart with S1 and S2 present and regular, abdomen with no distention and positive lower extremity edema up to mid calf.   Chest radiograph with cardiomegaly, with bilateral hilar vascular congestion, no infiltrates, no effusions, defibrillator in place with one right ventricular lead.   EKG 120 bpm, right axis deviation, right bundle branch block, qtc 511, sinus rhythm with frequent PVC, no significant ST segment or T wave changes.     Assessment and Plan: * Acute on chronic systolic CHF (congestive heart failure) (HCC) Echocardiogram from 04/2023 with reduced LV systolic function 25 to 30%, global hypokinesis, moderate enlarged cavity, RV systolic function with moderate reduction, mild enlarged cavity, RVSP 49, LA with moderate dilatation, mitral valve replaced/ repaired, mild to moderate TR,  Urine output 2,200  Systolic blood pressure 110 range.   Plan to continue diuresis with furosemide , increase dose to 60 mg IV bid.  Continue carvedilol , dapagliflozin , hydralazine , isosorobide and Enetresto.  Follow up on repeat echocardiogram.   Essential hypertension Continue  blood pressure control with hydralazine , isosorbide , entresto  and carvedilol .   Chronic kidney disease, stage 3a (HCC) Renal function with serum cr at 1.18 with K at 4,1 and serum bicarbonate at 24  Na 141 Mg 1.9   Plan to continue diuresis with furosemide , and SGLT 2 inh.  Follow up renal function and electrolytes in am,   Diabetes mellitus with nephropathy (HCC) Continue glucose cover and monitoring with basal insulin  and insulin  sliding scale.  Hold on oral antihyperglycemic agents and metformin  for now.   S/P CABG x 4 No active chest pain.  High troponin elevation due to heart failure exacerbation, ruled out acute coronary syndrome.   PAF (paroxysmal atrial fibrillation) (HCC) Continue rate control with carvedilol  and anticoagulation with warfarin INR 2,0    Class 1 obesity Calculated BMI is 33. 9        Subjective: Patient is feeling better but continue to have dyspnea and peripheral edema.   Physical Exam: Vitals:   12/18/23 0333 12/18/23 0622 12/18/23 0821 12/18/23 1118  BP: 107/64  111/65 110/70  Pulse:   80 80  Resp: 18  18 18   Temp: 98.5 F (36.9 C)  98.5 F (36.9 C) 98.5 F (36.9 C)  TempSrc: Oral  Oral Oral  SpO2:   96% 95%  Weight:  84.2 kg    Height:       Neurology awake and alert ENT with mild pallor Cardiovascular with S1 and S2 present and regular with no gallops or rubs, positive systolic murmur at the apex Mild JVD Positive lower extremity edema ++ Respiratory with positive rales bilaterally and decreased breath sounds at bases with no wheezing Abdomen with no distention  Data Reviewed:  Family Communication: no family at the bedside   Disposition: Status is: Inpatient Remains inpatient appropriate because: IV diuresis   Planned Discharge Destination: Home     Author: Elidia Toribio Furnace, MD 12/18/2023 4:26 PM  For on call review [email or web address].

## 2023-12-18 NOTE — Progress Notes (Signed)
 Mobility Specialist Progress Note:    12/18/23 1211  Mobility  Activity Ambulated with assistance in hallway  Level of Assistance Standby assist, set-up cues, supervision of patient - no hands on  Assistive Device None  Distance Ambulated (ft) 500 ft  Activity Response Tolerated well  Mobility Referral Yes  Mobility visit 1 Mobility  Mobility Specialist Start Time (ACUTE ONLY) 1115  Mobility Specialist Stop Time (ACUTE ONLY) 1130  Mobility Specialist Time Calculation (min) (ACUTE ONLY) 15 min   Received pt in bed having no complaints and agreeable to mobility. Pt was asymptomatic throughout ambulation and returned to room w/o fault. Left in bed w/ call bell in reach and all needs met.  During Mobility- SPO2 98% RA HR 88 Post Mobility- SPO2 99% RA HR 86  Thersia Minder Mobility Specialist  Please contact vis Secure Chat or  Rehab Office [phone]

## 2023-12-18 NOTE — Assessment & Plan Note (Signed)
 Continue rate control with carvedilol and anticoagulation with warfarin INR 2,0

## 2023-12-18 NOTE — Assessment & Plan Note (Signed)
 Continue glucose cover and monitoring with basal insulin and insulin sliding scale.  Hold on oral antihyperglycemic agents and metformin for now.

## 2023-12-18 NOTE — Assessment & Plan Note (Signed)
 Continue blood pressure control with hydralazine, isosorbide, entresto and carvedilol.

## 2023-12-18 NOTE — Progress Notes (Signed)
 PHARMACY - ANTICOAGULATION CONSULT NOTE  Pharmacy Consult for warfarin Indication: atrial fibrillation  Allergies  Allergen Reactions   Keflex  [Cephalexin ] Rash    Skin on feet peeled.   Asa [Aspirin ]     325 mg ----heartburn Pt can take 81 mg   Lisinopril  Cough    Patient Measurements: Height: 5' 2 (157.5 cm) Weight: 84.2 kg (185 lb 9.6 oz) IBW/kg (Calculated) : 50.1   Vital Signs: Temp: 98.5 F (36.9 C) (01/14 0821) Temp Source: Oral (01/14 0821) BP: 111/65 (01/14 0821) Pulse Rate: 80 (01/14 0821)  Labs: Recent Labs    12/17/23 1112 12/17/23 1313 12/17/23 1350 12/18/23 0225  HGB 12.1  --  13.6 11.7*  HCT 41.1  --  40.0 39.1  PLT 180  --   --  193  LABPROT  --  22.6*  --  22.8*  INR  --  2.0*  --  2.0*  CREATININE 1.17*  --   --  1.18*  TROPONINIHS 142* 137*  --   --     Estimated Creatinine Clearance: 45.9 mL/min (A) (by C-G formula based on SCr of 1.18 mg/dL (H)).   Medical History: Past Medical History:  Diagnosis Date   Arrhythmia    Hx PAF, PVC's   CHF (congestive heart failure) (HCC)    Coronary artery disease    04/04/2017 STEMI   Diabetes (HCC)    H/O mitral valve replacement 09/21/2021   Heart attack (HCC)    per pt reprot   Hyperlipidemia    Hypertension    New onset atrial fibrillation (HCC) 05/2017    Assessment: 47 YOF presenting with SOB and cough, hx of afib on warfarin PTA, INR on admission was therapeutic at 2, last dose PTA taken 1/12.  PTA dosing: 5mg  daily  INR remains at 2.0, therapeutic today. Hgb 13>11.7, pltc wnl stable.  No bleeding reported.  PTA dosing: 5mg  daily   Goal of Therapy:  INR 2-3 Monitor platelets by anticoagulation protocol: Yes   Plan:  Give warfarin 5mg  PO x 1 today Daily INR, s/s bleeding   Thank you for allowing pharmacy to be part of this patients care team. Levorn Gaskins, RPh Clinical Pharmacist 12/18/2023 11:42 AM   Please check AMION for all [facility] Pharmacy phone numbers After 10:00 PM,  call Main Pharmacy [phone]

## 2023-12-18 NOTE — Assessment & Plan Note (Addendum)
 No active chest pain.  High troponin elevation due to heart failure exacerbation, ruled out acute coronary syndrome.

## 2023-12-18 NOTE — Assessment & Plan Note (Signed)
 Renal function with serum cr at 1.18 with K at 4,1 and serum bicarbonate at 24  Na 141 Mg 1.9   Plan to continue diuresis with furosemide, and SGLT 2 inh.  Follow up renal function and electrolytes in am,

## 2023-12-18 NOTE — Assessment & Plan Note (Addendum)
 Echocardiogram from 04/2023 with reduced LV systolic function 25 to 30%, global hypokinesis, moderate enlarged cavity, RV systolic function with moderate reduction, mild enlarged cavity, RVSP 49, LA with moderate dilatation, mitral valve replaced/ repaired, mild to moderate TR,  Urine output 2,200  Systolic blood pressure 110 range.   Plan to continue diuresis with furosemide , increase dose to 60 mg IV bid.  Continue carvedilol , dapagliflozin , hydralazine , isosorobide and Enetresto.  Follow up on repeat echocardiogram.

## 2023-12-18 NOTE — Hospital Course (Addendum)
 Rachael Davis was admitted to the hospital with the working diagnosis of heart failure exacerbation.   68 yo female with the past medical history of coronary artery disease sp CABG, paroxysmal atrial fibrillation, T2DM, mitral valve replacement, hypertension and heart failure who presented with dyspnea and lower extremity edema. Reported few days of progressive dyspnea and lower extremity edema, associated with orthopnea and cough. She has not been compliant to furosemide  at home. On her initial physical examination her blood pressure was 159/86, HR 59, RR 22 and 02 saturation 98% on room, lungs with no wheezing or rhonchi, positive diffuse rales bilaterally, heart with S1 and S2 present and regular, abdomen with no distention and positive lower extremity edema up to mid calf.   Chest radiograph with cardiomegaly, with bilateral hilar vascular congestion, no infiltrates, no effusions, defibrillator in place with one right ventricular lead.   EKG 120 bpm, right axis deviation, right bundle branch block, qtc 511, sinus rhythm with frequent PVC, no significant ST segment or T wave changes.

## 2023-12-19 ENCOUNTER — Telehealth: Payer: Self-pay | Admitting: *Deleted

## 2023-12-19 ENCOUNTER — Inpatient Hospital Stay (HOSPITAL_COMMUNITY): Payer: No Typology Code available for payment source

## 2023-12-19 ENCOUNTER — Encounter: Payer: Self-pay | Admitting: *Deleted

## 2023-12-19 DIAGNOSIS — I5023 Acute on chronic systolic (congestive) heart failure: Secondary | ICD-10-CM | POA: Diagnosis not present

## 2023-12-19 DIAGNOSIS — R0609 Other forms of dyspnea: Secondary | ICD-10-CM | POA: Diagnosis not present

## 2023-12-19 DIAGNOSIS — Z006 Encounter for examination for normal comparison and control in clinical research program: Secondary | ICD-10-CM

## 2023-12-19 LAB — BASIC METABOLIC PANEL
Anion gap: 9 (ref 5–15)
BUN: 27 mg/dL — ABNORMAL HIGH (ref 8–23)
CO2: 28 mmol/L (ref 22–32)
Calcium: 8.9 mg/dL (ref 8.9–10.3)
Chloride: 101 mmol/L (ref 98–111)
Creatinine, Ser: 1.52 mg/dL — ABNORMAL HIGH (ref 0.44–1.00)
GFR, Estimated: 37 mL/min — ABNORMAL LOW (ref >60)
Glucose, Bld: 213 mg/dL — ABNORMAL HIGH (ref 70–99)
Potassium: 3.9 mmol/L (ref 3.5–5.1)
Sodium: 138 mmol/L (ref 135–145)

## 2023-12-19 LAB — ECHOCARDIOGRAM COMPLETE
AR max vel: 1.88 cm2
AV Area VTI: 1.66 cm2
AV Area mean vel: 1.81 cm2
AV Mean grad: 3 mm[Hg]
AV Peak grad: 5.7 mm[Hg]
Ao pk vel: 1.19 m/s
Area-P 1/2: 3.11 cm2
Calc EF: 24.1 %
Height: 62 in
MV VTI: 0.82 cm2
S' Lateral: 5.6 cm
Single Plane A2C EF: 14.9 %
Single Plane A4C EF: 31.3 %
Weight: 2913.6 [oz_av]

## 2023-12-19 LAB — PROTIME-INR
INR: 2.2 — ABNORMAL HIGH (ref 0.8–1.2)
Prothrombin Time: 24.9 s — ABNORMAL HIGH (ref 11.4–15.2)

## 2023-12-19 LAB — GLUCOSE, CAPILLARY
Glucose-Capillary: 154 mg/dL — ABNORMAL HIGH (ref 70–99)
Glucose-Capillary: 140 mg/dL — ABNORMAL HIGH (ref 70–99)
Glucose-Capillary: 136 mg/dL — ABNORMAL HIGH (ref 70–99)
Glucose-Capillary: 109 mg/dL — ABNORMAL HIGH (ref 70–99)

## 2023-12-19 LAB — MAGNESIUM: Magnesium: 2.5 mg/dL — ABNORMAL HIGH (ref 1.7–2.4)

## 2023-12-19 MED ORDER — PERFLUTREN LIPID MICROSPHERE
1.0000 mL | INTRAVENOUS | Status: AC | PRN
  Administered 2023-12-19: 3 mL via INTRAVENOUS

## 2023-12-19 MED ORDER — CARVEDILOL 3.125 MG PO TABS
3.1250 mg | ORAL_TABLET | Freq: Two times a day (BID) | ORAL | Status: DC
  Administered 2023-12-19 – 2023-12-20 (×2): 3.125 mg via ORAL
  Filled 2023-12-19 (×2): qty 1

## 2023-12-19 MED ORDER — BENZONATATE 100 MG PO CAPS
200.0000 mg | ORAL_CAPSULE | Freq: Two times a day (BID) | ORAL | Status: DC
  Administered 2023-12-19 – 2023-12-21 (×4): 200 mg via ORAL
  Filled 2023-12-19 (×4): qty 2

## 2023-12-19 MED ORDER — WARFARIN SODIUM 5 MG PO TABS
5.0000 mg | ORAL_TABLET | Freq: Once | ORAL | Status: AC
  Administered 2023-12-19: 5 mg via ORAL
  Filled 2023-12-19: qty 1

## 2023-12-19 NOTE — Research (Signed)
SITE: 050     Subject #[id number]   Subprotocol: A  Inclusion Criteria  Patients who meet all of the following criteria are eligible for enrollment as study participants:  Yes No  Age > 69 years old X   Eligible to wear Holter Study X    Exclusion Criteria  Patients who meet any of these criteria are not eligible for enrollment as study participants: Yes No  1. Receiving any mechanical (respiratory or circulatory) or renal support therapy at Screening or during Visit #1.  X  2.  Any other conditions that in the opinion of the investigators are likely to prevent compliance with the study protocol or pose a safety concern if the subject participates in the study.  X  3. Poor tolerance, namely susceptible to severe skin allergies from ECG adhesive patch application.  X   Protocol: REV H                                     Residential Zip code*  [address] (First 3 digits ONLY)                                          [facility] Informed Consent   Subject Name: Rachael Davis  Subject met inclusion and exclusion criteria.  The informed consent form, study requirements and expectations were reviewed with the subject. Subject had opportunity to read consent and questions and concerns were addressed prior to the signing of the consent form.  The subject verbalized understanding of the trial requirements.  The subject agreed to participate in the EF ACT trial and signed the informed consent at 1125 on 12/19/2023.  The informed consent was obtained prior to performance of any protocol-specific procedures for the subject.  A copy of the signed informed consent was given to the subject and a copy was placed in the subject's medical record.   Brunilda Payor         No current facility-administered medications for this visit. No current outpatient medications on file.  Facility-Administered Medications Ordered in Other Visits:    acetaminophen (TYLENOL) tablet 650 mg, 650 mg, Oral, Q6H PRN **OR**  acetaminophen (TYLENOL) suppository 650 mg, 650 mg, Rectal, Q6H PRN, [place], Flossie Buffy, MD   albuterol (VENTOLIN HFA) 108 (90 Base) MCG/ACT inhaler 2 puff, 2 puff, Inhalation, Q2H PRN, Steffanie Rainwater, MD   aspirin EC tablet 81 mg, 81 mg, Oral, Daily, Steffanie Rainwater, MD, 81 mg at 12/19/23 0913   atorvastatin (LIPITOR) tablet 40 mg, 40 mg, Oral, Daily, Steffanie Rainwater, MD, 40 mg at 12/19/23 0913   carvedilol (COREG) tablet 3.125 mg, 3.125 mg, Oral, BID, Zannie Cove, MD   dapagliflozin propanediol (FARXIGA) tablet 10 mg, 10 mg, Oral, Daily, Steffanie Rainwater, MD, 10 mg at 12/19/23 0913   furosemide (LASIX) injection 60 mg, 60 mg, Intravenous, BID, Arrien, York Ram, MD, 60 mg at 12/19/23 0601   hydrALAZINE (APRESOLINE) tablet 12.5 mg, 12.5 mg, Oral, TID, Steffanie Rainwater, MD, 12.5 mg at 12/19/23 0913   insulin aspart (novoLOG) injection 0-15 Units, 0-15 Units, Subcutaneous, TID WC, Steffanie Rainwater, MD, 3 Units at 12/19/23 0601   insulin aspart (novoLOG) injection 0-5 Units, 0-5 Units, Subcutaneous, QHS, Steffanie Rainwater, MD, 2 Units at 12/17/23 2129   ondansetron [facility]) tablet  4 mg, 4 mg, Oral, Q6H PRN **OR** ondansetron (ZOFRAN) injection 4 mg, 4 mg, Intravenous, Q6H PRN, Kirke Corin, Flossie Buffy, MD   perflutren lipid microspheres (DEFINITY) IV suspension, 1-10 mL, Intravenous, PRN, Arrien, York Ram, MD, 3 mL at 12/19/23 1036   sacubitril-valsartan (ENTRESTO) 24-26 mg per tablet, 1 tablet, Oral, BID, Steffanie Rainwater, MD, 1 tablet at 12/19/23 0913   senna-docusate (Senokot-S) tablet 1 tablet, 1 tablet, Oral, QHS PRN, Steffanie Rainwater, MD   warfarin (COUMADIN) tablet 5 mg, 5 mg, Oral, ONCE-1600, Tamera Reason, [facility]   Warfarin - Pharmacist Dosing Inpatient, , Does not apply, q1600, Daylene Posey, [facility], Given at 12/18/23 1821

## 2023-12-19 NOTE — Progress Notes (Signed)
 PROGRESS NOTE    Rachael Davis  ZOX:[id number] DOB: [birth year] DOA: 12/17/2023 PCP: Sun, Vyvyan, MD  67/F wCAD/CABG, PAF, systolic CHF, T2DM, MV replacement, hypertension  presented with dyspnea and lower extremity edema.  Poor compliance with diuretics at baseline.  In the ED tachypneic, hypertensive, labs noted BNP 3412, troponin 142, creatinine 1.1, hemoglobin 12, CXR with pulmonary vascular congestion, EKG sinus rhythm with few PVCs -Admitted, started on diuretics  Subjective: -Feels better overall, breathing is improving  Assessment and Plan:  Acute on chronic systolic CHF -Known ICM, last echo 5/24 with EF 25-30%, moderately reduced RV, mitral valve replaced -Poor compliance with diuretics and diet -Volume overloaded, continue IV Lasix  60 Mg twice daily, 4.2 L negative so far -Continue Farxiga , Entresto , carvedilol , hydralazine , discontinue Imdur  -Follow-up repeat echo  Bioprosthetic MVR -Stable on last echo, continue warfarin and aspirin   Essential hypertension -Improved, meds as above  Chronic kidney disease, stage 3a (HCC) -Stable, monitor with diuresis  Diabetes mellitus 2 -Improving, continue Farxiga , SSI  CAD, S/P CABG x 4 -High troponin elevation due to heart failure exacerbation, no ACS -Continue aspirin , Coreg , statin  PAF (paroxysmal atrial fibrillation) (HCC) -In sinus rhythm, continue warfarin and Coreg   Class 1 obesity Calculated BMI is 33. 9  DVT prophylaxis: Warfarin Code Status: Full code Family Communication: None present Disposition Plan: Home likely 48 hours  Consultants:    Procedures:   Antimicrobials:    Objective: Vitals:   12/19/23 0425 12/19/23 0807 12/19/23 0910 12/19/23 1119  BP: 104/70 106/74 105/66 99/68  Pulse: 70 74  71  Resp: 20 18  18   Temp: 97.6 F (36.4 C) 98.3 F (36.8 C)  98.3 F (36.8 C)  TempSrc: Oral Oral  Oral  SpO2: 97% 99%  100%  Weight: 82.6 kg     Height:        Intake/Output Summary (Last 24  hours) at 12/19/2023 1151 Last data filed at 12/18/2023 1843 Gross per 24 hour  Intake --  Output 1800 ml  Net -1800 ml   Filed Weights   12/17/23 1110 12/18/23 0622 12/19/23 0425  Weight: 87.5 kg 84.2 kg 82.6 kg    Examination:  General exam: Appears calm and comfortable  HEENT: Positive JVD Respiratory system: Clear to auscultation Cardiovascular system: S1 & S2 heard, RRR.  Abd: nondistended, soft and nontender.Normal bowel sounds heard. Central nervous system: Alert and oriented. No focal neurological deficits. Extremities: 1-2+ edema Skin: No rashes Psychiatry:  Mood & affect appropriate.     Data Reviewed:   CBC: Recent Labs  Lab 12/17/23 1112 12/17/23 1350 12/18/23 0225  WBC 5.7  --  6.9  NEUTROABS 3.9  --   --   HGB 12.1 13.6 11.7*  HCT 41.1 40.0 39.1  MCV 73.4*  --  71.9*  PLT 180  --  193   Basic Metabolic Panel: Recent Labs  Lab 12/17/23 1112 12/17/23 1350 12/18/23 0225 12/19/23 0243  NA 140 139 141 138  K 4.3 4.4 4.1 3.9  CL 108  --  107 101  CO2 22  --  24 28  GLUCOSE 226*  --  151* 213*  BUN 23  --  22 27*  CREATININE 1.17*  --  1.18* 1.52*  CALCIUM  9.1  --  9.1 8.9  MG  --   --  1.9 2.5*  PHOS  --   --  3.1  --    GFR: Estimated Creatinine Clearance: 35.3 mL/min (A) (by C-G formula based on SCr of  1.52 mg/dL (H)). Liver Function Tests: Recent Labs  Lab 12/17/23 1112  AST 34  ALT 33  ALKPHOS 127*  BILITOT 0.9  PROT 7.2  ALBUMIN  3.5   No results for input(s): "LIPASE", "AMYLASE" in the last 168 hours. No results for input(s): "AMMONIA" in the last 168 hours. Coagulation Profile: Recent Labs  Lab 12/17/23 1313 12/18/23 0225 12/19/23 0243  INR 2.0* 2.0* 2.2*   Cardiac Enzymes: No results for input(s): "CKTOTAL", "CKMB", "CKMBINDEX", "TROPONINI" in the last 168 hours. BNP (last 3 results) No results for input(s): "PROBNP" in the last 8760 hours. HbA1C: Recent Labs    12/18/23 0225  HGBA1C 7.9*   CBG: Recent Labs   Lab 12/18/23 1116 12/18/23 1611 12/18/23 2118 12/19/23 0600 12/19/23 1116  GLUCAP 144* 111* 117* 154* 140*   Lipid Profile: Recent Labs    12/18/23 0225  CHOL 132  HDL 39*  LDLCALC 81  TRIG 59  CHOLHDL 3.4   Thyroid  Function Tests: No results for input(s): "TSH", "T4TOTAL", "FREET4", "T3FREE", "THYROIDAB" in the last 72 hours. Anemia Panel: No results for input(s): "VITAMINB12", "FOLATE", "FERRITIN", "TIBC", "IRON", "RETICCTPCT" in the last 72 hours. Urine analysis:    Component Value Date/Time   COLORURINE YELLOW 04/15/2017 1229   APPEARANCEUR HAZY (A) 04/15/2017 1229   LABSPEC 1.010 04/15/2017 1229   PHURINE 5.0 04/15/2017 1229   GLUCOSEU NEGATIVE 04/15/2017 1229   HGBUR SMALL (A) 04/15/2017 1229   BILIRUBINUR NEGATIVE 04/15/2017 1229   KETONESUR NEGATIVE 04/15/2017 1229   PROTEINUR NEGATIVE 04/15/2017 1229   NITRITE NEGATIVE 04/15/2017 1229   LEUKOCYTESUR NEGATIVE 04/15/2017 1229   Sepsis Labs: @LABRCNTIP (procalcitonin:4,lacticidven:4)  ) Recent Results (from the past 240 hours)  Resp panel by RT-PCR (RSV, Flu A&B, Covid) Anterior Nasal Swab     Status: None   Collection Time: 12/17/23  2:33 PM   Specimen: Anterior Nasal Swab  Result Value Ref Range Status   SARS Coronavirus 2 by RT PCR NEGATIVE NEGATIVE Final   Influenza A by PCR NEGATIVE NEGATIVE Final   Influenza B by PCR NEGATIVE NEGATIVE Final    Comment: (NOTE) The Xpert Xpress SARS-CoV-2/FLU/RSV plus assay is intended as an aid in the diagnosis of influenza from Nasopharyngeal swab specimens and should not be used as a sole basis for treatment. Nasal washings and aspirates are unacceptable for Xpert Xpress SARS-CoV-2/FLU/RSV testing.  Fact Sheet for Patients: BloggerCourse.com  Fact Sheet for Healthcare Providers: SeriousBroker.it  This test is not yet approved or cleared by the [place]  FDA and has been authorized for detection and/or  diagnosis of SARS-CoV-2 by FDA under an Emergency Use Authorization (EUA). This EUA will remain in effect (meaning this test can be used) for the duration of the COVID-19 declaration under Section 564(b)(1) of the Act, 21 U.S.C. section 360bbb-3(b)(1), unless the authorization is terminated or revoked.     Resp Syncytial Virus by PCR NEGATIVE NEGATIVE Final    Comment: (NOTE) Fact Sheet for Patients: BloggerCourse.com  Fact Sheet for Healthcare Providers: SeriousBroker.it  This test is not yet approved or cleared by the [place]  FDA and has been authorized for detection and/or diagnosis of SARS-CoV-2 by FDA under an Emergency Use Authorization (EUA). This EUA will remain in effect (meaning this test can be used) for the duration of the COVID-19 declaration under Section 564(b)(1) of the Act, 21 U.S.C. section 360bbb-3(b)(1), unless the authorization is terminated or revoked.  Performed at [facility], [address], [place], [place] [address]  Radiology Studies: No results found.   Scheduled Meds:  aspirin  EC  81 mg Oral Daily   atorvastatin   40 mg Oral Daily   carvedilol   3.125 mg Oral BID   dapagliflozin  propanediol  10 mg Oral Daily   furosemide   60 mg Intravenous BID   hydrALAZINE   12.5 mg Oral TID   insulin  aspart  0-15 Units Subcutaneous TID WC   insulin  aspart  0-5 Units Subcutaneous QHS   sacubitril -valsartan   1 tablet Oral BID   warfarin  5 mg Oral ONCE-1600   Warfarin - Pharmacist Dosing Inpatient   Does not apply q1600   Continuous Infusions:   LOS: 2 days    Time spent:    Deforest Fast, MD [facility]   12/19/2023, 11:51 AM

## 2023-12-19 NOTE — Plan of Care (Signed)
 Pt vital signs remain in normal range

## 2023-12-19 NOTE — Progress Notes (Signed)
 PHARMACY - ANTICOAGULATION CONSULT NOTE  Pharmacy Consult for warfarin Indication: atrial fibrillation  Allergies  Allergen Reactions   Keflex  [Cephalexin ] Rash    Skin on feet peeled.   Alice Ao ]     325 mg ----heartburn Pt can take 81 mg   Lisinopril  Cough    Patient Measurements: Height: 5\' 2"  (157.5 cm) Weight: 82.6 kg (182 lb 1.6 oz) IBW/kg (Calculated) : 50.1   Vital Signs: Temp: 98.3 F (36.8 C) (01/15 0807) Temp Source: Oral (01/15 0807) BP: 105/66 (01/15 0910) Pulse Rate: 74 (01/15 0807)  Labs: Recent Labs    12/17/23 1112 12/17/23 1313 12/17/23 1350 12/18/23 0225 12/19/23 0243  HGB 12.1  --  13.6 11.7*  --   HCT 41.1  --  40.0 39.1  --   PLT 180  --   --  193  --   LABPROT  --  22.6*  --  22.8* 24.9*  INR  --  2.0*  --  2.0* 2.2*  CREATININE 1.17*  --   --  1.18* 1.52*  TROPONINIHS 142* 137*  --   --   --     Estimated Creatinine Clearance: 35.3 mL/min (A) (by C-G formula based on SCr of 1.52 mg/dL (H)).   Medical History: Past Medical History:  Diagnosis Date   Arrhythmia    Hx PAF, PVC's   CHF (congestive heart failure) (HCC)    Coronary artery disease    04/04/2017 STEMI   Diabetes (HCC)    H/O mitral valve replacement 09/21/2021   Heart attack (HCC)    per pt reprot   Hyperlipidemia    Hypertension    New onset atrial fibrillation (HCC) 05/2017    Assessment: 4 YOF presenting with SOB and cough, hx of afib on warfarin PTA, INR on admission was therapeutic at 2, last dose PTA taken 1/12.  PTA dosing: 5mg  daily  INR is 2.2, remains therapeutic today on her ususal PTA dosing for 5mg  daily.  Hgb 13>11.7 , pltc wnl stable, last checked 12/18/23.  No bleeding reported. SCr has increased to 1.52.   PTA dosing: 5mg  daily   Goal of Therapy:  INR 2-3 Monitor platelets by anticoagulation protocol: Yes   Plan:  Give warfarin 5mg  PO x 1 today Daily INR, s/s bleeding F/u CBC in AM.   Thank you for allowing pharmacy to be part of  this patients care team. Alisa Irish, RPh Clinical Pharmacist 12/19/2023 11:03 AM   Please check AMION for all [facility] Pharmacy phone numbers After 10:00 PM, call Main Pharmacy [phone]

## 2023-12-19 NOTE — TOC Initial Note (Signed)
 Transition of Care [facility]) - Initial/Assessment Note    Patient Details  Name: Rachael Davis MRN: [id number] Date of Birth: 11-30-1956  Transition of [facility]) CM/SW Contact:    Jennett Model, RN Phone Number: 12/19/2023, 2:38 PM  Clinical Narrative:                 From home with spouse, has PCP and insurance on file, states has no HH services in place at this time or DME at home.  States spouse or daughter will transport her home at [employer] and family is support system, states [facility] [address]   Pta self ambulatory .  Expected Discharge Plan: Home/Self Care Barriers to Discharge: Continued Medical Work up   Patient Goals and CMS Choice Patient states their goals for this hospitalization and ongoing recovery are:: return home   Choice offered to / list presented to : NA      Expected Discharge Plan and Services In-house Referral: NA Discharge Planning Services: CM Consult Post Acute Care Choice: NA Living arrangements for the past 2 months: Single Family Home                 DME Arranged: N/A DME Agency: NA       HH Arranged: NA          Prior Living Arrangements/Services Living arrangements for the past 2 months: Single Family Home Lives with:: Spouse Patient language and need for interpreter reviewed:: Yes Do you feel safe going back to the place where you live?: Yes      Need for Family Participation in Patient Care: Yes (Comment) Care giver support system in place?: Yes (comment)   Criminal Activity/Legal Involvement Pertinent to Current Situation/Hospitalization: No - Comment as needed  Activities of Daily Living   ADL Screening (condition at time of admission) Independently performs ADLs?: Yes (appropriate for developmental age) Is the patient deaf or have difficulty hearing?: No Does the patient have difficulty seeing, even when wearing glasses/contacts?: No Does the patient have difficulty concentrating, remembering, or  making decisions?: No  Permission Sought/Granted Permission sought to share information with : Case Manager Permission granted to share information with : Yes, Verbal Permission Granted              Emotional Assessment   Attitude/Demeanor/Rapport: Engaged Affect (typically observed): Appropriate Orientation: : Oriented to Self, Oriented to Place, Oriented to  Time, Oriented to Situation Alcohol / Substance Use: Not Applicable Psych Involvement: No (comment)  Admission diagnosis:  Acute exacerbation of congestive heart failure (HCC) [I50.9] Acute respiratory failure with hypoxia (HCC) [J96.01] Congestive heart failure, unspecified HF chronicity, unspecified heart failure type (HCC) [I50.9] Patient Active Problem List   Diagnosis Date Noted   Acute exacerbation of congestive heart failure (HCC) 12/17/2023   Acute respiratory failure with hypoxia (HCC) 12/17/2023   VT (ventricular tachycardia) (HCC)sustained 10/02/2022   RBBB 10/02/2022   PVC's (premature ventricular contractions) 10/02/2022   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 10/02/2022   Chronic systolic CHF (congestive heart failure) (HCC) 05/30/2022   Class 1 obesity 04/05/2022   Chronic kidney disease, stage 3a (HCC) 04/04/2022   Acute on chronic systolic CHF (congestive heart failure) (HCC) 04/03/2022   Hyperkalemia 02/09/2021   Palpitations 01/20/2021   Ischemic cardiomyopathy 01/20/2021   1st degree AV block 01/20/2021   Multiple thyroid  nodules 08/25/2019   PAF (paroxysmal atrial fibrillation) (HCC) 07/03/2017   Systolic and diastolic CHF, acute on chronic (HCC) 05/23/2017  Volume overload 05/08/2017   Long term (current) use of anticoagulants [Z79.01] 04/23/2017   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   Essential hypertension 04/04/2017   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) 04/04/2017   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   PCP:  Sun, Vyvyan, MD Pharmacy:    [phone] [place], [place] - [address] AT [facility] [address] [place] [address] Phone: [phone] Fax: [phone]  [facility] [place], [place] - [address] [place] [address] Phone: [phone] Fax: [phone]  [facility] [place], [place] - [address] [place] [address] Phone: [phone] Fax: [phone]     Social Drivers of Health (SDOH) Social History: SDOH Screenings   Food Insecurity: No Food Insecurity (12/18/2023)  Housing: High Risk (12/18/2023)  Transportation Needs: No Transportation Needs (12/18/2023)  Utilities: Not At Risk (12/17/2023)  Alcohol Screen: Low Risk  (04/06/2022)  Social Connections: Moderately Isolated (12/18/2023)  Tobacco Use: Low Risk  (12/17/2023)   SDOH Interventions:     Readmission Risk Interventions    04/06/2022   11:40 AM  Readmission Risk Prevention Plan  Transportation Screening Complete  PCP or Specialist Appt within 5-7 Days Complete  Home Care Screening Complete  Medication Review (RN CM) Complete

## 2023-12-19 NOTE — Progress Notes (Signed)
   12/19/23 2200  BiPAP/CPAP/SIPAP  Reason BIPAP/CPAP not in use Non-compliant (pt refused)  BiPAP/CPAP /SiPAP Vitals  Pulse Rate 75  Resp 19  SpO2 100 %

## 2023-12-19 NOTE — Telephone Encounter (Signed)
 Received a call from the pt to call her back. Spoke with pt and she states she is currently admitted. Advised to call back when she is discharged and she verbalized understanding.

## 2023-12-19 NOTE — Progress Notes (Signed)
 Echocardiogram 2D Echocardiogram has been performed.  Rachael Davis Rachael Davis 12/19/2023, 10:36 AM

## 2023-12-20 ENCOUNTER — Ambulatory Visit: Payer: No Typology Code available for payment source

## 2023-12-20 DIAGNOSIS — I5023 Acute on chronic systolic (congestive) heart failure: Secondary | ICD-10-CM | POA: Diagnosis not present

## 2023-12-20 LAB — GLUCOSE, CAPILLARY
Glucose-Capillary: 133 mg/dL — ABNORMAL HIGH (ref 70–99)
Glucose-Capillary: 185 mg/dL — ABNORMAL HIGH (ref 70–99)
Glucose-Capillary: 108 mg/dL — ABNORMAL HIGH (ref 70–99)
Glucose-Capillary: 193 mg/dL — ABNORMAL HIGH (ref 70–99)

## 2023-12-20 LAB — CBC
HCT: 39.4 % (ref 36.0–46.0)
Hemoglobin: 11.9 g/dL — ABNORMAL LOW (ref 12.0–15.0)
MCH: 21.7 pg — ABNORMAL LOW (ref 26.0–34.0)
MCHC: 30.2 g/dL (ref 30.0–36.0)
MCV: 71.8 fL — ABNORMAL LOW (ref 80.0–100.0)
Platelets: 172 10*3/uL (ref 150–400)
RBC: 5.49 MIL/uL — ABNORMAL HIGH (ref 3.87–5.11)
RDW: 19.9 % — ABNORMAL HIGH (ref 11.5–15.5)
WBC: 6.4 10*3/uL (ref 4.0–10.5)
nRBC: 0 % (ref 0.0–0.2)

## 2023-12-20 LAB — BASIC METABOLIC PANEL
Anion gap: 13 (ref 5–15)
BUN: 32 mg/dL — ABNORMAL HIGH (ref 8–23)
CO2: 26 mmol/L (ref 22–32)
Calcium: 8.5 mg/dL — ABNORMAL LOW (ref 8.9–10.3)
Chloride: 98 mmol/L (ref 98–111)
Creatinine, Ser: 1.61 mg/dL — ABNORMAL HIGH (ref 0.44–1.00)
GFR, Estimated: 35 mL/min — ABNORMAL LOW (ref >60)
Glucose, Bld: 136 mg/dL — ABNORMAL HIGH (ref 70–99)
Potassium: 3.9 mmol/L (ref 3.5–5.1)
Sodium: 137 mmol/L (ref 135–145)

## 2023-12-20 LAB — PROTIME-INR
INR: 2.2 — ABNORMAL HIGH (ref 0.8–1.2)
Prothrombin Time: 24.6 s — ABNORMAL HIGH (ref 11.4–15.2)

## 2023-12-20 MED ORDER — WARFARIN SODIUM 5 MG PO TABS
5.0000 mg | ORAL_TABLET | Freq: Every day | ORAL | Status: DC
  Administered 2023-12-20: 5 mg via ORAL
  Filled 2023-12-20: qty 1

## 2023-12-20 MED ORDER — FUROSEMIDE 10 MG/ML IJ SOLN
40.0000 mg | Freq: Two times a day (BID) | INTRAMUSCULAR | Status: DC
Start: 2023-12-20 — End: 2023-12-21
  Administered 2023-12-20 – 2023-12-21 (×2): 40 mg via INTRAVENOUS
  Filled 2023-12-20 (×2): qty 4

## 2023-12-20 MED ORDER — SACUBITRIL-VALSARTAN 24-26 MG PO TABS
1.0000 | ORAL_TABLET | Freq: Two times a day (BID) | ORAL | Status: DC
Start: 2023-12-21 — End: 2023-12-21
  Administered 2023-12-21: 1 via ORAL
  Filled 2023-12-20: qty 1

## 2023-12-20 NOTE — Progress Notes (Signed)
Medication details completed on AVS.

## 2023-12-20 NOTE — Progress Notes (Signed)
PHARMACY - ANTICOAGULATION CONSULT NOTE  Pharmacy Consult for warfarin Indication: atrial fibrillation  Allergies  Allergen Reactions   Keflex [Cephalexin] Rash    Skin on feet peeled.   Asa [Aspirin]     325 mg ----heartburn Pt can take 81 mg   Lisinopril Cough    Patient Measurements: Height: 5\' 2"  (157.5 cm) Weight: 85.3 kg (188 lb) IBW/kg (Calculated) : 50.1   Vital Signs: Temp: 97.7 F (36.5 C) (01/16 0734) Temp Source: Oral (01/16 0734) BP: 105/83 (01/16 0734) Pulse Rate: 81 (01/16 0734)  Labs: Recent Labs    12/17/23 1112 12/17/23 1112 12/17/23 1313 12/17/23 1350 12/18/23 0225 12/19/23 0243 12/20/23 0238  HGB 12.1  --   --  13.6 11.7*  --  11.9*  HCT 41.1  --   --  40.0 39.1  --  39.4  PLT 180  --   --   --  193  --  172  LABPROT  --    < > 22.6*  --  22.8* 24.9* 24.6*  INR  --    < > 2.0*  --  2.0* 2.2* 2.2*  CREATININE 1.17*  --   --   --  1.18* 1.52* 1.61*  TROPONINIHS 142*  --  137*  --   --   --   --    < > = values in this interval not displayed.    Estimated Creatinine Clearance: 33.9 mL/min (A) (by C-G formula based on SCr of 1.61 mg/dL (H)).   Medical History: Past Medical History:  Diagnosis Date   Arrhythmia    Hx PAF, PVC's   CHF (congestive heart failure) (HCC)    Coronary artery disease    04/04/2017 STEMI   Diabetes (HCC)    H/O mitral valve replacement 09/21/2021   Heart attack (HCC)    per pt reprot   Hyperlipidemia    Hypertension    New onset atrial fibrillation (HCC) 05/2017    Assessment: 33 YOF presenting with SOB and cough, hx of afib on warfarin PTA, INR on admission was therapeutic at 2, last dose PTA taken 1/12.  PTA dosing: 5mg  daily  INR is 2.2 and remains therapeutic today on her ususal PTA dosing for 5mg  daily. Eating 50-100% of meals  Goal of Therapy:  INR 2-3 Monitor platelets by anticoagulation protocol: Yes   Plan:  Schedule warfarin 5mg  daily  Q mon/Thur INR, Q Mon CBC   Alphia Moh, PharmD,  BCPS, BCCP Clinical Pharmacist  Please check AMION for all [facility] Pharmacy phone numbers After 10:00 PM, call Main Pharmacy [phone]

## 2023-12-20 NOTE — Plan of Care (Signed)

## 2023-12-20 NOTE — Progress Notes (Signed)
Informed by Telemetry patient having frequent pauses. Hospitalist informed. 12 Lead EKG to be performed. Coreg discontinued. Patient reports heart flutter. Denies any nausea.

## 2023-12-20 NOTE — Progress Notes (Signed)
PROGRESS NOTE    Rachael Davis  ZOX:[id number] DOB: 03-Oct-1956 DOA: 12/17/2023 PCP: Deatra James, MD  67/F wCAD/CABG, PAF, systolic CHF, T2DM, MV replacement, hypertension  presented with dyspnea and lower extremity edema.  Poor compliance with diuretics at baseline.  In the ED tachypneic, hypertensive, labs noted BNP 3412, troponin 142, creatinine 1.1, hemoglobin 12, CXR with pulmonary vascular congestion, EKG sinus rhythm with few PVCs -Admitted, started on diuretics  Subjective: -Feels better overall, breathing is improving  Assessment and Plan:  Acute on chronic systolic CHF -Known ICM, last echo 5/24 with EF 25-30%, moderately reduced RV, mitral valve replaced -Poor compliance with diuretics and diet -Improving with diuresis, 4.8 L negative, continue IV Lasix 1 more day  -Continue Farxiga, Entresto, carvedilol, hydralazine, discontinue Imdur -Repeat echo with EF 20-25% moderately reduced RV -Transition to torsemide at discharge for hopefully better compliance and close follow-up with CHF team  Bioprosthetic MVR -Stable on last echo, continue warfarin and aspirin  Essential hypertension -Improved, meds as above  Chronic kidney disease, stage 3a (HCC) -Mild uptrend in creatinine, monitor  Diabetes mellitus 2 -Improving, continue Farxiga, SSI  CAD, S/P CABG x 4 -High troponin elevation due to heart failure exacerbation, no ACS -Continue aspirin, Coreg, statin  PAF (paroxysmal atrial fibrillation) (HCC) -In sinus rhythm, continue warfarin and Coreg  Class 1 obesity Calculated BMI is 33. 9  DVT prophylaxis: Warfarin Code Status: Full code Family Communication: None present Disposition Plan: Home likely 1 to 2 days  Consultants:    Procedures:   Antimicrobials:    Objective: Vitals:   12/19/23 2322 12/20/23 0449 12/20/23 0734 12/20/23 1108  BP: 104/69 105/68 105/83 101/68  Pulse:  72 81 74  Resp: 18 18 18 20   Temp: 98 F (36.7 C) 97.8 F (36.6 C) 97.7  F (36.5 C) 98.1 F (36.7 C)  TempSrc: Oral Oral Oral Oral  SpO2: 100% 100% 100% 100%  Weight:  85.3 kg    Height:        Intake/Output Summary (Last 24 hours) at 12/20/2023 1301 Last data filed at 12/20/2023 1239 Gross per 24 hour  Intake 1017 ml  Output 950 ml  Net 67 ml   Filed Weights   12/18/23 0622 12/19/23 0425 12/20/23 0449  Weight: 84.2 kg 82.6 kg 85.3 kg    Examination:  General exam: Pleasant female sitting up in bed, AAOx3 HEENT: Positive JVD CVS: S1-S2, regular rhythm Lungs: Clear bilaterally Abdomen: Soft, nontender, bowel sounds present Extremities: 1+ edema  Skin: No rashes Psychiatry:  Mood & affect appropriate.     Data Reviewed:   CBC: Recent Labs  Lab 12/17/23 1112 12/17/23 1350 12/18/23 0225 12/20/23 0238  WBC 5.7  --  6.9 6.4  NEUTROABS 3.9  --   --   --   HGB 12.1 13.6 11.7* 11.9*  HCT 41.1 40.0 39.1 39.4  MCV 73.4*  --  71.9* 71.8*  PLT 180  --  193 172   Basic Metabolic Panel: Recent Labs  Lab 12/17/23 1112 12/17/23 1350 12/18/23 0225 12/19/23 0243 12/20/23 0238  NA 140 139 141 138 137  K 4.3 4.4 4.1 3.9 3.9  CL 108  --  107 101 98  CO2 22  --  24 28 26   GLUCOSE 226*  --  151* 213* 136*  BUN 23  --  22 27* 32*  CREATININE 1.17*  --  1.18* 1.52* 1.61*  CALCIUM 9.1  --  9.1 8.9 8.5*  MG  --   --  1.9 2.5*  --   PHOS  --   --  3.1  --   --    GFR: Estimated Creatinine Clearance: 33.9 mL/min (A) (by C-G formula based on SCr of 1.61 mg/dL (H)). Liver Function Tests: Recent Labs  Lab 12/17/23 1112  AST 34  ALT 33  ALKPHOS 127*  BILITOT 0.9  PROT 7.2  ALBUMIN 3.5   No results for input(s): "LIPASE", "AMYLASE" in the last 168 hours. No results for input(s): "AMMONIA" in the last 168 hours. Coagulation Profile: Recent Labs  Lab 12/17/23 1313 12/18/23 0225 12/19/23 0243 12/20/23 0238  INR 2.0* 2.0* 2.2* 2.2*   Cardiac Enzymes: No results for input(s): "CKTOTAL", "CKMB", "CKMBINDEX", "TROPONINI" in the last 168  hours. BNP (last 3 results) No results for input(s): "PROBNP" in the last 8760 hours. HbA1C: Recent Labs    12/18/23 0225  HGBA1C 7.9*   CBG: Recent Labs  Lab 12/19/23 1116 12/19/23 1622 12/19/23 2053 12/20/23 0556 12/20/23 1106  GLUCAP 140* 136* 109* 133* 185*   Lipid Profile: Recent Labs    12/18/23 0225  CHOL 132  HDL 39*  LDLCALC 81  TRIG 59  CHOLHDL 3.4   Thyroid Function Tests: No results for input(s): "TSH", "T4TOTAL", "FREET4", "T3FREE", "THYROIDAB" in the last 72 hours. Anemia Panel: No results for input(s): "VITAMINB12", "FOLATE", "FERRITIN", "TIBC", "IRON", "RETICCTPCT" in the last 72 hours. Urine analysis:    Component Value Date/Time   COLORURINE YELLOW 04/15/2017 1229   APPEARANCEUR HAZY (A) 04/15/2017 1229   LABSPEC 1.010 04/15/2017 1229   PHURINE 5.0 04/15/2017 1229   GLUCOSEU NEGATIVE 04/15/2017 1229   HGBUR SMALL (A) 04/15/2017 1229   BILIRUBINUR NEGATIVE 04/15/2017 1229   KETONESUR NEGATIVE 04/15/2017 1229   PROTEINUR NEGATIVE 04/15/2017 1229   NITRITE NEGATIVE 04/15/2017 1229   LEUKOCYTESUR NEGATIVE 04/15/2017 1229   Sepsis Labs: @LABRCNTIP (procalcitonin:4,lacticidven:4)  ) Recent Results (from the past 240 hours)  Resp panel by RT-PCR (RSV, Flu A&B, Covid) Anterior Nasal Swab     Status: None   Collection Time: 12/17/23  2:33 PM   Specimen: Anterior Nasal Swab  Result Value Ref Range Status   SARS Coronavirus 2 by RT PCR NEGATIVE NEGATIVE Final   Influenza A by PCR NEGATIVE NEGATIVE Final   Influenza B by PCR NEGATIVE NEGATIVE Final    Comment: (NOTE) The Xpert Xpress SARS-CoV-2/FLU/RSV plus assay is intended as an aid in the diagnosis of influenza from Nasopharyngeal swab specimens and should not be used as a sole basis for treatment. Nasal washings and aspirates are unacceptable for Xpert Xpress SARS-CoV-2/FLU/RSV testing.  Fact Sheet for Patients: BloggerCourse.com  Fact Sheet for Healthcare  Providers: SeriousBroker.it  This test is not yet approved or cleared by the [place] FDA and has been authorized for detection and/or diagnosis of SARS-CoV-2 by FDA under an Emergency Use Authorization (EUA). This EUA will remain in effect (meaning this test can be used) for the duration of the COVID-19 declaration under Section 564(b)(1) of the Act, 21 U.S.C. section 360bbb-3(b)(1), unless the authorization is terminated or revoked.     Resp Syncytial Virus by PCR NEGATIVE NEGATIVE Final    Comment: (NOTE) Fact Sheet for Patients: BloggerCourse.com  Fact Sheet for Healthcare Providers: SeriousBroker.it  This test is not yet approved or cleared by the [place] FDA and has been authorized for detection and/or diagnosis of SARS-CoV-2 by FDA under an Emergency Use Authorization (EUA). This EUA will remain in effect (meaning this test can be used) for the duration  of the COVID-19 declaration under Section 564(b)(1) of the Act, 21 U.S.C. section 360bbb-3(b)(1), unless the authorization is terminated or revoked.  Performed at [facility], [address] [place], [place] [address]      Radiology Studies: ECHOCARDIOGRAM COMPLETE Result Date: 12/19/2023    ECHOCARDIOGRAM REPORT   Patient Name:   Rachael Davis Date of Exam: 12/19/2023 Medical Rec #:  [id number]       Height:       62.0 in Accession #:    [id number]      Weight:       182.1 lb Date of Birth:  November 22, 1956       BSA:          1.837 m Patient Age:    67 years        BP:           104/70 mmHg Patient Gender: F               HR:           75 bpm. Exam Location:  Inpatient Procedure: 2D Echo, 3D Echo, Cardiac Doppler, Color Doppler, Strain Analysis and            Intracardiac Opacification Agent Indications:    Dyspnea  History:        Patient has prior history of Echocardiogram examinations, most                 recent 04/17/2023. Prior  CABG; Risk Factors:Hypertension.                  Mitral Valve: 25 mm Magna Ease bioprosthetic valve valve is                 present in the mitral position.  Sonographer:    Karma Ganja Referring Phys: [id number] MAURICIO DANIEL ARRIEN  Sonographer Comments: Global longitudinal strain was attempted. IMPRESSIONS  1. Left ventricular ejection fraction, by estimation, is 20 to 25%. The left ventricle has severely decreased function. The left ventricle demonstrates global hypokinesis. The left ventricular internal cavity size was moderately dilated. There is mild left ventricular hypertrophy. Left ventricular diastolic parameters are indeterminate.  2. Right ventricular systolic function is moderately reduced. The right ventricular size is mildly enlarged. There is mildly elevated pulmonary artery systolic pressure. The estimated right ventricular systolic pressure is 44.2 mmHg.  3. Left atrial size was moderately dilated.  4. Right atrial size was mildly dilated.  5. The mitral valve has been repaired/replaced. Trivial mitral valve regurgitation. The mean mitral valve gradient is 8.0 mmHg with average heart rate of 73 bpm. There is a 25 mm Magna Ease bioprosthetic valve present in the mitral position.  6. The aortic valve is tricuspid. Aortic valve regurgitation is not visualized. Aortic valve sclerosis is present, with no evidence of aortic valve stenosis.  7. The inferior vena cava is dilated in size with <50% respiratory variability, suggesting right atrial pressure of 15 mmHg. FINDINGS  Left Ventricle: Left ventricular ejection fraction, by estimation, is 20 to 25%. The left ventricle has severely decreased function. The left ventricle demonstrates global hypokinesis. Definity contrast agent was given IV to delineate the left ventricular endocardial borders. The left ventricular internal cavity size was moderately dilated. There is mild left ventricular hypertrophy. Left ventricular diastolic parameters are  indeterminate. Right Ventricle: The right ventricular size is mildly enlarged. Right vetricular wall thickness was not well visualized. Right ventricular systolic function is moderately reduced. There is  mildly elevated pulmonary artery systolic pressure. The tricuspid  regurgitant velocity is 2.70 m/s, and with an assumed right atrial pressure of 15 mmHg, the estimated right ventricular systolic pressure is 44.2 mmHg. Left Atrium: Left atrial size was moderately dilated. Right Atrium: Right atrial size was mildly dilated. Pericardium: There is no evidence of pericardial effusion. Mitral Valve: The mitral valve has been repaired/replaced. Trivial mitral valve regurgitation. There is a 25 mm Magna Ease bioprosthetic valve present in the mitral position. MV peak gradient, 11.7 mmHg. The mean mitral valve gradient is 8.0 mmHg with average heart rate of 73 bpm. Tricuspid Valve: The tricuspid valve is normal in structure. Tricuspid valve regurgitation is mild. Aortic Valve: The aortic valve is tricuspid. Aortic valve regurgitation is not visualized. Aortic valve sclerosis is present, with no evidence of aortic valve stenosis. Aortic valve mean gradient measures 3.0 mmHg. Aortic valve peak gradient measures 5.7  mmHg. Aortic valve area, by VTI measures 1.66 cm. Pulmonic Valve: The pulmonic valve was not well visualized. Pulmonic valve regurgitation is not visualized. Aorta: The aortic root and ascending aorta are structurally normal, with no evidence of dilitation. Venous: The inferior vena cava is dilated in size with less than 50% respiratory variability, suggesting right atrial pressure of 15 mmHg. IAS/Shunts: The interatrial septum was not well visualized.  LEFT VENTRICLE PLAX 2D LVIDd:         6.00 cm      Diastology LVIDs:         5.60 cm      LV e' medial:    4.13 cm/s LV PW:         1.10 cm      LV E/e' medial:  36.1 LV IVS:        1.20 cm      LV e' lateral:   4.35 cm/s LVOT diam:     2.00 cm      LV E/e'  lateral: 34.3 LV SV:         39 LV SV Index:   21 LVOT Area:     3.14 cm                              3D Volume EF: LV Volumes (MOD)            3D EF:        19 % LV vol d, MOD A2C: 134.0 ml LV EDV:       209 ml LV vol d, MOD A4C: 201.0 ml LV ESV:       169 ml LV vol s, MOD A2C: 114.0 ml LV SV:        40 ml LV vol s, MOD A4C: 138.0 ml LV SV MOD A2C:     20.0 ml LV SV MOD A4C:     201.0 ml LV SV MOD BP:      40.1 ml RIGHT VENTRICLE            IVC RV Basal diam:  4.70 cm    IVC diam: 2.60 cm RV S prime:     5.22 cm/s TAPSE (M-mode): 2.3 cm LEFT ATRIUM             Index        RIGHT ATRIUM           Index LA diam:        4.70 cm 2.56 cm/m   RA Area:  22.10 cm LA Vol (A2C):   93.5 ml 50.90 ml/m  RA Volume:   70.40 ml  38.32 ml/m LA Vol (A4C):   81.8 ml 44.53 ml/m LA Biplane Vol: 89.5 ml 48.72 ml/m  AORTIC VALVE AV Area (Vmax):    1.88 cm AV Area (Vmean):   1.81 cm AV Area (VTI):     1.66 cm AV Vmax:           119.00 cm/s AV Vmean:          79.300 cm/s AV VTI:            0.233 m AV Peak Grad:      5.7 mmHg AV Mean Grad:      3.0 mmHg LVOT Vmax:         71.10 cm/s LVOT Vmean:        45.600 cm/s LVOT VTI:          0.123 m LVOT/AV VTI ratio: 0.53  AORTA Ao Root diam: 3.10 cm Ao Asc diam:  3.00 cm MITRAL VALVE                TRICUSPID VALVE MV Area (PHT): 3.11 cm     TR Peak grad:   29.2 mmHg MV Area VTI:   0.82 cm     TR Vmax:        270.00 cm/s MV Peak grad:  11.7 mmHg MV Mean grad:  8.0 mmHg     SHUNTS MV Vmax:       1.71 m/s     Systemic VTI:  0.12 m MV Vmean:      135.0 cm/s   Systemic Diam: 2.00 cm MV Decel Time: 244 msec MV E velocity: 149.00 cm/s MV A velocity: 142.00 cm/s MV E/A ratio:  1.05 Epifanio Lesches MD Electronically signed by Epifanio Lesches MD Signature Date/Time: 12/19/2023/1:21:05 PM    Final      Scheduled Meds:  aspirin EC  81 mg Oral Daily   atorvastatin  40 mg Oral Daily   benzonatate  200 mg Oral BID   carvedilol  3.125 mg Oral BID   dapagliflozin propanediol  10 mg  Oral Daily   furosemide  40 mg Intravenous BID   hydrALAZINE  12.5 mg Oral TID   insulin aspart  0-15 Units Subcutaneous TID WC   insulin aspart  0-5 Units Subcutaneous QHS   [START ON 12/21/2023] sacubitril-valsartan  1 tablet Oral BID   warfarin  5 mg Oral q1600   Warfarin - Pharmacist Dosing Inpatient   Does not apply q1600   Continuous Infusions:   LOS: 3 days    Time spent:    Zannie Cove, MD [facility]   12/20/2023, 1:01 PM

## 2023-12-20 NOTE — Plan of Care (Signed)
  Problem: Clinical Measurements: Goal: Diagnostic test results will improve Outcome: Progressing   Problem: Activity: Goal: Risk for activity intolerance will decrease Outcome: Progressing   Problem: Safety: Goal: Ability to remain free from injury will improve Outcome: Progressing   

## 2023-12-20 NOTE — Progress Notes (Signed)
Mobility Specialist Progress Note:   12/20/23 1010  Mobility  Activity Ambulated independently in room  Level of Assistance Modified independent, requires aide device or extra time  Assistive Device None  Distance Ambulated (ft) 500 ft  Activity Response Tolerated well  Mobility Referral Yes  Mobility visit 1 Mobility  Mobility Specialist Start Time (ACUTE ONLY) 1010  Mobility Specialist Stop Time (ACUTE ONLY) 1023  Mobility Specialist Time Calculation (min) (ACUTE ONLY) 13 min   Pt agreeable to mobility session. Required no physical assistance throughout ambulation. C/o minor SOB at end of session, SpO2 100% on RA. Pt back in chair with all needs met.   Addison Lank Mobility Specialist Please contact via SecureChat or  Rehab office at [phone]

## 2023-12-21 ENCOUNTER — Other Ambulatory Visit (HOSPITAL_COMMUNITY): Payer: Self-pay

## 2023-12-21 DIAGNOSIS — I5023 Acute on chronic systolic (congestive) heart failure: Secondary | ICD-10-CM | POA: Diagnosis not present

## 2023-12-21 LAB — BASIC METABOLIC PANEL
Anion gap: 11 (ref 5–15)
BUN: 32 mg/dL — ABNORMAL HIGH (ref 8–23)
CO2: 27 mmol/L (ref 22–32)
Calcium: 8.7 mg/dL — ABNORMAL LOW (ref 8.9–10.3)
Chloride: 100 mmol/L (ref 98–111)
Creatinine, Ser: 1.56 mg/dL — ABNORMAL HIGH (ref 0.44–1.00)
GFR, Estimated: 36 mL/min — ABNORMAL LOW (ref >60)
Glucose, Bld: 152 mg/dL — ABNORMAL HIGH (ref 70–99)
Potassium: 3.9 mmol/L (ref 3.5–5.1)
Sodium: 138 mmol/L (ref 135–145)

## 2023-12-21 LAB — GLUCOSE, CAPILLARY: Glucose-Capillary: 138 mg/dL — ABNORMAL HIGH (ref 70–99)

## 2023-12-21 MED ORDER — TORSEMIDE 20 MG PO TABS
40.0000 mg | ORAL_TABLET | Freq: Every day | ORAL | 1 refills | Status: DC
  Filled 2023-12-21: qty 60, 30d supply, fill #0

## 2023-12-21 NOTE — Care Management Important Message (Signed)
Important Message  Patient Details  Name: Rachael Davis MRN: [id number] Date of Birth: 10/26/1956   Important Message Given:  Yes - Medicare IM     Dorena Bodo 12/21/2023, 4:37 PM

## 2023-12-21 NOTE — Care Management Important Message (Signed)
Important Message  Patient Details  Name: Rachael Davis MRN: [id number] Date of Birth: [birth year]   Important Message Given:  Yes - Medicare IM  Patient left prior to IM delivery will mail to the patient home address.    Shateka Petrea 12/21/2023, 4:37 PM

## 2023-12-21 NOTE — Discharge Summary (Signed)
Physician Discharge Summary  Rachael Davis ZOX:[id number] DOB: 1956/10/07 DOA: 12/17/2023  PCP: Rachael James, MD  Admit date: 12/17/2023 Discharge date: 12/21/2023  Time spent: 45 minutes  Recommendations for Outpatient Follow-up:  Advanced heart failure clinic Dr. Gala Davis in 2 weeks   Discharge Diagnoses:  Principal Problem:   Acute on chronic systolic CHF (congestive heart failure) (HCC) Active Problems:   Essential hypertension   Chronic kidney disease, stage 3a (HCC)   Diabetes mellitus with nephropathy (HCC)   S/P CABG x 4   PAF (paroxysmal atrial fibrillation) (HCC)   Class 1 obesity   Discharge Condition: Improved  Diet recommendation: Low sodium, heart healthy, diabetic  Filed Weights   12/19/23 0425 12/20/23 0449 12/21/23 0416  Weight: 82.6 kg 85.3 kg 81.3 kg    History of present illness:  67/F wCAD/CABG, PAF, systolic CHF, T2DM, MV replacement, hypertension  presented with dyspnea and lower extremity edema.  Poor compliance with diuretics at baseline.  In the ED tachypneic, hypertensive, labs noted BNP 3412, troponin 142, creatinine 1.1, hemoglobin 12, CXR with pulmonary vascular congestion, EKG sinus rhythm with few PVCs -Admitted, started on diuretics  Hospital Course:   Acute on chronic systolic CHF -Known ICM, last echo 5/24 with EF 25-30%, moderately reduced RV, mitral valve replaced -Poor compliance with diuretics and diet, patient was unable to comply with twice daily Lasix on working days -Improving with diuresis, 7.2 L negative,  -Continue Farxiga, Entresto, carvedilol, hydralazine, discontinue Imdur -Repeat echo with EF 20-25% moderately reduced RV -Transitioned to daily torsemide at discharge for hopefully better compliance and close follow-up with CHF team   Bioprosthetic MVR -Stable on last echo, continue warfarin and aspirin   Essential hypertension -Improved, meds as above   Chronic kidney disease, stage 3a (HCC) -Relatively stable    Diabetes mellitus 2 -See EGDs improving, continue Farxiga, SSI   CAD, S/P CABG x 4 -High troponin elevation due to heart failure exacerbation, no ACS -Continue aspirin, Coreg, statin   PAF (paroxysmal atrial fibrillation) (HCC) -In sinus rhythm, continue warfarin and Coreg   Class 1 obesity Calculated BMI is 33. 9  Discharge Exam: Vitals:   12/21/23 0416 12/21/23 0831  BP: 92/62 107/80  Pulse: 75 77  Resp: 18 18  Temp: 97.8 F (36.6 C) 98.1 F (36.7 C)  SpO2: 100% 99%   General exam: Pleasant female sitting up in bed, AAOx3 HEENT: noJVD CVS: S1-S2, regular rhythm, metallic click Lungs: Clear bilaterally Abdomen: Soft, nontender, bowel sounds present Extremities: Trace edema Skin: No rashes  Discharge Instructions   Discharge Instructions     Diet - low sodium heart healthy   Complete by: As directed    Increase activity slowly   Complete by: As directed       Allergies as of 12/21/2023       Reactions   Keflex [cephalexin] Rash   Skin on feet peeled.   Asa [aspirin]    325 mg ----heartburn Pt can take 81 mg   Lisinopril Cough        Medication List     STOP taking these medications    furosemide 80 MG tablet Commonly known as: LASIX       TAKE these medications    acetaminophen 325 MG tablet Commonly known as: TYLENOL Take 2 tablets (650 mg total) by mouth every 6 (six) hours as needed for mild pain or headache.   aspirin EC 81 MG tablet Take 81 mg by mouth daily.   atorvastatin 40 MG  tablet Commonly known as: LIPITOR Take 1 tablet (40 mg total) by mouth daily.   carvedilol 6.25 MG tablet Commonly known as: COREG Take 1 tablet (6.25 mg total) by mouth 2 (two) times daily.   CORICIDIN HBP PO Take 2 tablets by mouth daily as needed (congestion).   dapagliflozin propanediol 10 MG Tabs tablet Commonly known as: FARXIGA Take 10 mg by mouth daily.   Entresto 24-26 MG Generic drug: sacubitril-valsartan TAKE 1 TABLET BY MOUTH  TWICE A DAY   glipiZIDE 10 MG 24 hr tablet Commonly known as: GLUCOTROL XL Take 1 tablet (10 mg total) by mouth daily with breakfast.   hydrALAZINE 25 MG tablet Commonly known as: APRESOLINE TAKE 1/2 OF A TABLET (12.5 MG TOTAL) BY MOUTH 3 TIMES A DAY What changed: See the new instructions.   isosorbide mononitrate 30 MG 24 hr tablet Commonly known as: IMDUR TAKE 1/2 OF A TABLET (15 MG TOTAL) BY MOUTH DAILY What changed: See the new instructions.   metFORMIN 1000 MG tablet Commonly known as: GLUCOPHAGE Take 1,000 mg by mouth 2 (two) times daily with a meal.   potassium chloride SA 20 MEQ tablet Commonly known as: KLOR-CON M Take 2 tablets by mouth daily. What changed:  how much to take how to take this when to take this additional instructions   Rybelsus 7 MG Tabs Generic drug: Semaglutide Take 7 mg by mouth daily.   torsemide 20 MG tablet Commonly known as: DEMADEX Take 2 tablets (40 mg total) by mouth daily. And take extra 20mg  for weight gain of 3lb in 1 day or 5lb in 1 week   warfarin 5 MG tablet Commonly known as: COUMADIN Take as directed. If you are unsure how to take this medication, talk to your nurse or doctor. Original instructions: TAKE 1 TABLET BY MOUTH DAILY OR AS DIRECTED BY COUMADIN CLINIC What changed: See the new instructions.       Allergies  Allergen Reactions   Keflex [Cephalexin] Rash    Skin on feet peeled.   Asa [Aspirin]     325 mg ----heartburn Pt can take 81 mg   Lisinopril Cough      The results of significant diagnostics from this hospitalization (including imaging, microbiology, ancillary and laboratory) are listed below for reference.    Significant Diagnostic Studies: ECHOCARDIOGRAM COMPLETE Result Date: 12/19/2023    ECHOCARDIOGRAM REPORT   Patient Name:   Rachael Davis Date of Exam: 12/19/2023 Medical Rec #:  [id number]       Height:       62.0 in Accession #:    [id number]      Weight:       182.1 lb Date of Birth:   03-19-1956       BSA:          1.837 m Patient Age:    67 years        BP:           104/70 mmHg Patient Gender: F               HR:           75 bpm. Exam Location:  Inpatient Procedure: 2D Echo, 3D Echo, Cardiac Doppler, Color Doppler, Strain Analysis and            Intracardiac Opacification Agent Indications:    Dyspnea  History:        Patient has prior history of Echocardiogram examinations, most  recent 04/17/2023. Prior CABG; Risk Factors:Hypertension.                  Mitral Valve: 25 mm Magna Ease bioprosthetic valve valve is                 present in the mitral position.  Sonographer:    Karma Ganja Referring Phys: [id number] MAURICIO DANIEL ARRIEN  Sonographer Comments: Global longitudinal strain was attempted. IMPRESSIONS  1. Left ventricular ejection fraction, by estimation, is 20 to 25%. The left ventricle has severely decreased function. The left ventricle demonstrates global hypokinesis. The left ventricular internal cavity size was moderately dilated. There is mild left ventricular hypertrophy. Left ventricular diastolic parameters are indeterminate.  2. Right ventricular systolic function is moderately reduced. The right ventricular size is mildly enlarged. There is mildly elevated pulmonary artery systolic pressure. The estimated right ventricular systolic pressure is 44.2 mmHg.  3. Left atrial size was moderately dilated.  4. Right atrial size was mildly dilated.  5. The mitral valve has been repaired/replaced. Trivial mitral valve regurgitation. The mean mitral valve gradient is 8.0 mmHg with average heart rate of 73 bpm. There is a 25 mm Magna Ease bioprosthetic valve present in the mitral position.  6. The aortic valve is tricuspid. Aortic valve regurgitation is not visualized. Aortic valve sclerosis is present, with no evidence of aortic valve stenosis.  7. The inferior vena cava is dilated in size with <50% respiratory variability, suggesting right atrial pressure of 15 mmHg.  FINDINGS  Left Ventricle: Left ventricular ejection fraction, by estimation, is 20 to 25%. The left ventricle has severely decreased function. The left ventricle demonstrates global hypokinesis. Definity contrast agent was given IV to delineate the left ventricular endocardial borders. The left ventricular internal cavity size was moderately dilated. There is mild left ventricular hypertrophy. Left ventricular diastolic parameters are indeterminate. Right Ventricle: The right ventricular size is mildly enlarged. Right vetricular wall thickness was not well visualized. Right ventricular systolic function is moderately reduced. There is mildly elevated pulmonary artery systolic pressure. The tricuspid  regurgitant velocity is 2.70 m/s, and with an assumed right atrial pressure of 15 mmHg, the estimated right ventricular systolic pressure is 44.2 mmHg. Left Atrium: Left atrial size was moderately dilated. Right Atrium: Right atrial size was mildly dilated. Pericardium: There is no evidence of pericardial effusion. Mitral Valve: The mitral valve has been repaired/replaced. Trivial mitral valve regurgitation. There is a 25 mm Magna Ease bioprosthetic valve present in the mitral position. MV peak gradient, 11.7 mmHg. The mean mitral valve gradient is 8.0 mmHg with average heart rate of 73 bpm. Tricuspid Valve: The tricuspid valve is normal in structure. Tricuspid valve regurgitation is mild. Aortic Valve: The aortic valve is tricuspid. Aortic valve regurgitation is not visualized. Aortic valve sclerosis is present, with no evidence of aortic valve stenosis. Aortic valve mean gradient measures 3.0 mmHg. Aortic valve peak gradient measures 5.7  mmHg. Aortic valve area, by VTI measures 1.66 cm. Pulmonic Valve: The pulmonic valve was not well visualized. Pulmonic valve regurgitation is not visualized. Aorta: The aortic root and ascending aorta are structurally normal, with no evidence of dilitation. Venous: The inferior vena  cava is dilated in size with less than 50% respiratory variability, suggesting right atrial pressure of 15 mmHg. IAS/Shunts: The interatrial septum was not well visualized.  LEFT VENTRICLE PLAX 2D LVIDd:         6.00 cm      Diastology LVIDs:  5.60 cm      LV e' medial:    4.13 cm/s LV PW:         1.10 cm      LV E/e' medial:  36.1 LV IVS:        1.20 cm      LV e' lateral:   4.35 cm/s LVOT diam:     2.00 cm      LV E/e' lateral: 34.3 LV SV:         39 LV SV Index:   21 LVOT Area:     3.14 cm                              3D Volume EF: LV Volumes (MOD)            3D EF:        19 % LV vol d, MOD A2C: 134.0 ml LV EDV:       209 ml LV vol d, MOD A4C: 201.0 ml LV ESV:       169 ml LV vol s, MOD A2C: 114.0 ml LV SV:        40 ml LV vol s, MOD A4C: 138.0 ml LV SV MOD A2C:     20.0 ml LV SV MOD A4C:     201.0 ml LV SV MOD BP:      40.1 ml RIGHT VENTRICLE            IVC RV Basal diam:  4.70 cm    IVC diam: 2.60 cm RV S prime:     5.22 cm/s TAPSE (M-mode): 2.3 cm LEFT ATRIUM             Index        RIGHT ATRIUM           Index LA diam:        4.70 cm 2.56 cm/m   RA Area:     22.10 cm LA Vol (A2C):   93.5 ml 50.90 ml/m  RA Volume:   70.40 ml  38.32 ml/m LA Vol (A4C):   81.8 ml 44.53 ml/m LA Biplane Vol: 89.5 ml 48.72 ml/m  AORTIC VALVE AV Area (Vmax):    1.88 cm AV Area (Vmean):   1.81 cm AV Area (VTI):     1.66 cm AV Vmax:           119.00 cm/s AV Vmean:          79.300 cm/s AV VTI:            0.233 m AV Peak Grad:      5.7 mmHg AV Mean Grad:      3.0 mmHg LVOT Vmax:         71.10 cm/s LVOT Vmean:        45.600 cm/s LVOT VTI:          0.123 m LVOT/AV VTI ratio: 0.53  AORTA Ao Root diam: 3.10 cm Ao Asc diam:  3.00 cm MITRAL VALVE                TRICUSPID VALVE MV Area (PHT): 3.11 cm     TR Peak grad:   29.2 mmHg MV Area VTI:   0.82 cm     TR Vmax:        270.00 cm/s MV Peak grad:  11.7 mmHg MV Mean grad:  8.0 mmHg     SHUNTS MV Vmax:  1.71 m/s     Systemic VTI:  0.12 m MV Vmean:      135.0 cm/s    Systemic Diam: 2.00 cm MV Decel Time: 244 msec MV E velocity: 149.00 cm/s MV A velocity: 142.00 cm/s MV E/A ratio:  1.05 Epifanio Lesches MD Electronically signed by Epifanio Lesches MD Signature Date/Time: 12/19/2023/1:21:05 PM    Final    DG Chest 2 View Result Date: 12/17/2023 CLINICAL DATA:  Shortness of breath with cough and right leg edema for the past few days. EXAM: CHEST - 2 VIEW COMPARISON:  Radiographs 09/13/2022 and 06/30/2022. FINDINGS: Lower lung volumes, especially on the lateral view. The heart is enlarged status post median sternotomy, CABG and mitral valve replacement. Left subclavian AICD projects to the right ventricular apex. There is increased vascular congestion with possible mild edema and small bilateral pleural effusions. Probable mild left lower lobe atelectasis without consolidation. No evidence of pneumothorax. The bones appear unchanged. IMPRESSION: Cardiomegaly with increased vascular congestion and possible mild congestive heart failure. Electronically Signed   By: Carey Bullocks M.D.   On: 12/17/2023 12:22    Microbiology: Recent Results (from the past 240 hours)  Resp panel by RT-PCR (RSV, Flu A&B, Covid) Anterior Nasal Swab     Status: None   Collection Time: 12/17/23  2:33 PM   Specimen: Anterior Nasal Swab  Result Value Ref Range Status   SARS Coronavirus 2 by RT PCR NEGATIVE NEGATIVE Final   Influenza A by PCR NEGATIVE NEGATIVE Final   Influenza B by PCR NEGATIVE NEGATIVE Final    Comment: (NOTE) The Xpert Xpress SARS-CoV-2/FLU/RSV plus assay is intended as an aid in the diagnosis of influenza from Nasopharyngeal swab specimens and should not be used as a sole basis for treatment. Nasal washings and aspirates are unacceptable for Xpert Xpress SARS-CoV-2/FLU/RSV testing.  Fact Sheet for Patients: BloggerCourse.com  Fact Sheet for Healthcare Providers: SeriousBroker.it  This test is not yet  approved or cleared by the [place] FDA and has been authorized for detection and/or diagnosis of SARS-CoV-2 by FDA under an Emergency Use Authorization (EUA). This EUA will remain in effect (meaning this test can be used) for the duration of the COVID-19 declaration under Section 564(b)(1) of the Act, 21 U.S.C. section 360bbb-3(b)(1), unless the authorization is terminated or revoked.     Resp Syncytial Virus by PCR NEGATIVE NEGATIVE Final    Comment: (NOTE) Fact Sheet for Patients: BloggerCourse.com  Fact Sheet for Healthcare Providers: SeriousBroker.it  This test is not yet approved or cleared by the [place] FDA and has been authorized for detection and/or diagnosis of SARS-CoV-2 by FDA under an Emergency Use Authorization (EUA). This EUA will remain in effect (meaning this test can be used) for the duration of the COVID-19 declaration under Section 564(b)(1) of the Act, 21 U.S.C. section 360bbb-3(b)(1), unless the authorization is terminated or revoked.  Performed at [facility], [address], [place], [place] [address]      Labs: Basic Metabolic Panel: Recent Labs  Lab 12/17/23 1112 12/17/23 1350 12/18/23 0225 12/19/23 0243 12/20/23 0238 12/21/23 0240  NA 140 139 141 138 137 138  K 4.3 4.4 4.1 3.9 3.9 3.9  CL 108  --  107 101 98 100  CO2 22  --  24 28 26 27   GLUCOSE 226*  --  151* 213* 136* 152*  BUN 23  --  22 27* 32* 32*  CREATININE 1.17*  --  1.18* 1.52* 1.61* 1.56*  CALCIUM  9.1  --  9.1 8.9 8.5* 8.7*  MG  --   --  1.9 2.5*  --   --   PHOS  --   --  3.1  --   --   --    Liver Function Tests: Recent Labs  Lab 12/17/23 1112  AST 34  ALT 33  ALKPHOS 127*  BILITOT 0.9  PROT 7.2  ALBUMIN 3.5   No results for input(s): "LIPASE", "AMYLASE" in the last 168 hours. No results for input(s): "AMMONIA" in the last 168 hours. CBC: Recent Labs  Lab 12/17/23 1112 12/17/23 1350  12/18/23 0225 12/20/23 0238  WBC 5.7  --  6.9 6.4  NEUTROABS 3.9  --   --   --   HGB 12.1 13.6 11.7* 11.9*  HCT 41.1 40.0 39.1 39.4  MCV 73.4*  --  71.9* 71.8*  PLT 180  --  193 172   Cardiac Enzymes: No results for input(s): "CKTOTAL", "CKMB", "CKMBINDEX", "TROPONINI" in the last 168 hours. BNP: BNP (last 3 results) Recent Labs    03/23/23 1119 07/03/23 1501 12/17/23 1113  BNP 343.3* 813.8* 3,413.2*    ProBNP (last 3 results) No results for input(s): "PROBNP" in the last 8760 hours.  CBG: Recent Labs  Lab 12/20/23 0556 12/20/23 1106 12/20/23 1556 12/20/23 2055 12/21/23 0642  GLUCAP 133* 185* 108* 193* 138*       Signed:  Zannie Cove MD.  [facility] 12/21/2023, 9:11 AM

## 2023-12-21 NOTE — Progress Notes (Signed)
Explained discharge instructions to patient. Reviewed follow up appointment and next medication administration times. Also reviewed education. Patient verbalized having an understanding for instructions given. All belongings are in the patient's possession to include TOC meds. IV and telemetry were removed. CCMD was notified. No other needs verbalized. Transporting downstairs to the discharge lounge.

## 2023-12-21 NOTE — TOC Transition Note (Signed)
Transition of Care [facility]) - Discharge Note   Patient Details  Name: Rachael Davis MRN: [id number] Date of Birth: Dec 20, 1955  [facility] CM/SW Contact:  Leone Haven, RN Phone Number: 12/21/2023, 9:22 AM   Clinical Narrative:    For dc, has no needs. Has transport.   Final next level of care: Home/Self Care Barriers to Discharge: Continued Medical Work up   Patient Goals and CMS Choice Patient states their goals for this hospitalization and ongoing recovery are:: return home   Choice offered to / list presented to : NA      Discharge Placement                       Discharge Plan and Services Additional resources added to the After Visit Summary for   In-house Referral: NA Discharge Planning Services: CM Consult Post Acute Care Choice: NA          DME Arranged: N/A DME Agency: NA       HH Arranged: NA          Social Drivers of Health (SDOH) Interventions SDOH Screenings   Food Insecurity: No Food Insecurity (12/18/2023)  Housing: High Risk (12/18/2023)  Transportation Needs: No Transportation Needs (12/18/2023)  Utilities: Not At Risk (12/17/2023)  Alcohol Screen: Low Risk  (04/06/2022)  Social Connections: Moderately Isolated (12/18/2023)  Tobacco Use: Low Risk  (12/17/2023)     Readmission Risk Interventions    04/06/2022   11:40 AM  Readmission Risk Prevention Plan  Transportation Screening Complete  PCP or Specialist Appt within 5-7 Days Complete  Home Care Screening Complete  Medication Review (RN CM) Complete

## 2023-12-27 ENCOUNTER — Telehealth (HOSPITAL_COMMUNITY): Payer: Self-pay | Admitting: *Deleted

## 2023-12-27 NOTE — Telephone Encounter (Signed)
Received following mess from answering service:   Pt was admitted for HF 1/13-1/17 and is sch for f/u on 01/07/24, Attempted to return call to discuss her needs and Left message to call back

## 2024-01-02 ENCOUNTER — Ambulatory Visit (INDEPENDENT_AMBULATORY_CARE_PROVIDER_SITE_OTHER): Payer: No Typology Code available for payment source

## 2024-01-02 DIAGNOSIS — I255 Ischemic cardiomyopathy: Secondary | ICD-10-CM

## 2024-01-02 LAB — CUP PACEART REMOTE DEVICE CHECK
Battery Remaining Longevity: 168 mo
Battery Remaining Percentage: 100 %
Brady Statistic RV Percent Paced: 0 %
Date Time Interrogation Session: 20250129020300
HighPow Impedance: 71 Ohm
Implantable Lead Connection Status: 753985
Implantable Lead Implant Date: 20230728
Implantable Lead Location: 753860
Implantable Lead Model: 138
Implantable Lead Serial Number: 303552
Implantable Pulse Generator Implant Date: 20230728
Lead Channel Impedance Value: 469 Ohm
Lead Channel Pacing Threshold Amplitude: 0.7 V
Lead Channel Pacing Threshold Pulse Width: 0.4 ms
Lead Channel Setting Pacing Amplitude: 2.5 V
Lead Channel Setting Pacing Pulse Width: 0.4 ms
Lead Channel Setting Sensing Sensitivity: 0.6 mV
Pulse Gen Serial Number: 217674

## 2024-01-04 NOTE — Progress Notes (Signed)
Advanced Heart Failure Clinic Note   PCP: Deatra James, MD Primary Cardiologist: Dr. Duke Salvia HF Cardiologist: Dr. Gala Romney  HPI: 68 y.o. 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   She was previously followed by Dr. Gala Romney in Advanced Heart Failure clinic, graduated from clinic in 05/2019. Echo 05/2019: EF 35-40%. Has been followed by Dr. Duke Salvia with Cardiology.  Echo 1/22: EF 30-35%  Echo 1/23: EF 25-30%, RV moderately reduced, RVSP 38.8 mmHg, trivial MR, mean gradient 7 mmHg across mitral valve prosthesis, IVC dilated with estimated RAP 15 mmHg  Saw EP in 3/23. D/t presence of progressive conduction system disease workup for amyloidosis recommended. Unable to tolerate cMRI d/t claustrophobia.   7 day Zio 4/23: SR with avg rate 83 bpm, 1st degree AVB and intermittent Wenckebach AV block noted. 2.3% PVCs. 6 runs VT, longest 27 min and 48 seconds. ICD recommended. She wanted to discuss with family before proceeding.  Admitted 5/23 with a/c CHF. Diuresed with IV lasix. Discharged on furosemide 80 mg daily. GDMT - coreg 3.125 mg BID and dapagliflozin 10 mg daily.   Seen in [facility] clinic 04/17/22. Added back Entresto, later stopped when she developed hyperkalemia. Felt better when she was on Entresto in the past. LifeVest placed dt/ episode sustained VT on monitor in April.  PYP scan 04/20/22 equivocal for TTR amyloidosis.  R/LHC 04/20/22: LIMA to LAD and SVG to OM patent, occluded SVG to RCA and SVG to Ramus, normal LVEDP, moderate mixed PAH, severe mitral stenosis (felt to be overestimated). Med management of CAD recommended, if has refractory angina could consider CTO RCA.  S/p [place] SCI ICD implant 7/23.   Seen in ED 09/14/22 with SOB. Given IV lasix with improvement of symptoms.  Follow up 10/23, NYHA II and volume overloaded, ReDs 40%.  Echo 5/24 showed EF  25-30%, RV moderately reduced, stable mitral valve prosthesis, mild to moderate TR  Today she returns for HF follow up. Overall feeling fine. She has "good days and bad days." Has some ankle swelling. She has occasional dizziness, most recent episode last month, no falls. She has frequent palpitations. Denies abnormal bleeding, CP, or PND/Orthopnea. Appetite ok. No fever or chills. She lost her scale. Taking all medications. 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    Cardiac Studies  - Echo (5/24): EF 25-30%, RV moderately reduced, stable mitral valve prosthesis, mild to moderate TR  - R/LHC (5/23):  1. Severe 3v CAD 2. LIMA to LAD and SVG to OM-1 widely patent 3. SVG to RCA and SVG to Ramus occluded 4. LVEF 25% 5. Normal LVEDP 6. Moderate mixed PAH 7. Apparent severe mitral stenosis (suspect overestimated based on echo)  Ao = 126/65 (90)  LV = 114/13 RA =  10 RV = 52/9 PA = 50/18 (33) PCW = 22 (v = 30) Fick cardiac output/index = 4.6/2.6 PVR = 2.4 WU FA sat = 94% PA sat = 60%, 63% MV gradient on LV-wedge tracing = mean 13.0 MVA 0.93cm2 PAPi = 3.2  - PYP (5/23): equivocal for TTR amyloidosis (Bensimhon read: completely negative)  - Zio (4/23): SR with avg rate 83 bpm, 1st degree AVB and intermittent Wenckebach AV block noted. 2.3% PVCs. 6 runs VT, longest 27 min and 48 seconds.   - Echo (1/23): EF 25-30%, RV moderately reduced, RVSP  38.8 mmHg, trivial MR, mean gradient 7 mmHg across mitral valve prosthesis, IVC dilated with estimated RAP 15 mmHg  - Echo (1/22): EF 30-35%  - Echo (6/20): EF 35-40%.   Review of Systems: Cardiac and respiratory. Negative except as mentioned in HPI.  Past Medical History:  Diagnosis Date   Arrhythmia    Hx PAF, PVC's   CHF (congestive heart failure) (HCC)    Coronary artery disease    04/04/2017 STEMI   Diabetes [facility])    H/O mitral valve replacement 09/21/2021    Heart attack (HCC)    per pt reprot   Hyperlipidemia    Hypertension    New onset atrial fibrillation (HCC) 05/2017   Current Outpatient Medications  Medication Sig Dispense Refill   acetaminophen (TYLENOL) 325 MG tablet Take 2 tablets (650 mg total) by mouth every 6 (six) hours as needed for mild pain or headache. (Patient not taking: Reported on 12/17/2023)     aspirin EC 81 MG tablet Take 81 mg by mouth daily.     atorvastatin (LIPITOR) 40 MG tablet Take 1 tablet (40 mg total) by mouth daily. 90 tablet 2   carvedilol (COREG) 6.25 MG tablet Take 1 tablet (6.25 mg total) by mouth 2 (two) times daily. 180 tablet 3   dapagliflozin propanediol (FARXIGA) 10 MG TABS tablet Take 10 mg by mouth daily.     DM-APAP-CPM (CORICIDIN HBP PO) Take 2 tablets by mouth daily as needed (congestion).     ENTRESTO 24-26 MG TAKE 1 TABLET BY MOUTH TWICE A DAY 60 tablet 3   glipiZIDE (GLUCOTROL XL) 10 MG 24 hr tablet Take 1 tablet (10 mg total) by mouth daily with breakfast.     hydrALAZINE (APRESOLINE) 25 MG tablet TAKE 1/2 OF A TABLET (12.5 MG TOTAL) BY MOUTH 3 TIMES A DAY (Patient taking differently: Take 12.5 mg by mouth 3 (three) times daily.) 135 tablet 1   isosorbide mononitrate (IMDUR) 30 MG 24 hr tablet TAKE 1/2 OF A TABLET (15 MG TOTAL) BY MOUTH DAILY (Patient taking differently: Take 15 mg by mouth daily.) 45 tablet 3   metFORMIN (GLUCOPHAGE) 1000 MG tablet Take 1,000 mg by mouth 2 (two) times daily with a meal.     potassium chloride SA (KLOR-CON M) 20 MEQ tablet Take 2 tablets by mouth daily. (Patient taking differently: Take 20 mEq by mouth daily. Patient stated she is only taking 1 tablet instead of 2. 12/17/2023.) 30 tablet 11   RYBELSUS 7 MG TABS Take 7 mg by mouth daily.     torsemide (DEMADEX) 20 MG tablet Take 2 tablets (40 mg total) by mouth daily. And take extra 20mg  for weight gain of 3lb in 1 day or 5lb in 1 week 60 tablet 1   warfarin (COUMADIN) 5 MG tablet TAKE 1 TABLET BY MOUTH DAILY OR AS  DIRECTED BY COUMADIN CLINIC (Patient taking differently: Take 5 mg by mouth daily. TAKE 1 TABLET BY MOUTH DAILY OR AS DIRECTED BY COUMADIN CLINIC) 90 tablet 1   No current facility-administered medications for this visit.   Allergies  Allergen Reactions   Keflex [Cephalexin] Rash    Skin on feet peeled.   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  Tobacco Use   Smoking status: Never   Smokeless tobacco: Never  Vaping Use   Vaping status: Never Used  Substance and Sexual Activity   Alcohol use: No   Drug use: Yes    Types: Marijuana    Comment: In her 46 's   Sexual activity: Not on file  Other Topics Concern   Not on file  Social History Narrative   Not on file   Social Drivers of Health   Financial Resource Strain: Not on file  Food Insecurity: No Food Insecurity (12/18/2023)   Hunger Vital Sign    Worried About Running Out of Food in the Last Year: Never true    Ran Out of Food in the Last Year: Never true  Transportation Needs: No Transportation Needs (12/18/2023)   PRAPARE - Administrator, Civil Service (Medical): No    Lack of Transportation (Non-Medical): No  Physical Activity: Not on file  Stress: Not on file  Social Connections: Moderately Isolated (12/18/2023)   Social Connection and Isolation Panel [NHANES]    Frequency of Communication with Friends and Family: More than three times a week    Frequency of Social Gatherings with Friends and Family: More than three times a week    Attends Religious Services: Never    Database administrator or Organizations: No    Attends Banker Meetings: Never     Marital Status: Married  Catering manager Violence: Unknown (12/17/2023)   Humiliation, Afraid, Rape, and Kick questionnaire    Fear of Current or Ex-Partner: No    Emotionally Abused: No    Physically Abused: Not on file    Sexually Abused: No   Family History  Problem Relation Age of Onset   Heart attack Father 78   Breast cancer Maternal Grandmother    There were no vitals taken for this visit.  Wt Readings from Last 3 Encounters:  12/21/23 81.3 kg (179 lb 4.8 oz)  07/03/23 86.1 kg (189 lb 12.8 oz)  07/03/23 86 kg (189 lb 9.6 oz)   PHYSICAL EXAM: General:  NAD. No resp difficulty, walked into clinic HEENT: Normal Neck: Supple. JVP 7-8 Carotids 2+ bilat; no bruits. No lymphadenopathy or thryomegaly appreciated. Cor: PMI nondisplaced. Regular rate & rhythm. No rubs, gallops or murmurs. Lungs: Clear Abdomen: Soft, obese, nontender, nondistended. No hepatosplenomegaly. No bruits or masses. Good bowel sounds. Extremities: No cyanosis, clubbing, rash, + pedal edema Neuro: Alert & oriented x 3, cranial nerves grossly intact. Moves all 4 extremities w/o difficulty. Affect pleasant.  ECG (personally reviewed): SR with AVB PR 344 msec, 87 bpm  ICD interrogation (personally reviewed): HL score 13, impedance ok, activity level 0.5 hr/day, average HR 79 bpm  ASSESSMENT & PLAN: Chronic Systolic Heart Failure/ICM - Echo (02/06/2018): EF 35-40% - Echo (6/20): EF 35-40% - Echo (1/22): EF 30-35% - Echo (1/23): EF 25-30% (appears closer to 20% on review with Dr. Gala Romney) - Sustained VT on zio 04/23. Wore LifeVest while deciding on ICD. - R/LHC (04/20/22): LIMA to LAD and SVG to OM patent, occluded SVG to RCA and SVG to Ramus, normal LVEDP, moderate mixed PAH, severe mitral stenosis (felt to be overestimated). Med management of CAD recommended, if has refractory angina could consider CTO RCA. - Unable to tolerate MRI d/t claustrophobia.  - PYP scan (5/23): negative. - s/p [place]  SCI ICD  7/23 - Echo (5/24): EF 25-30%, RV moderately reduced, stable mitral valve prosthesis, mild to moderate TR - NYHA IIb. Volume ok, up a bit on exam - Continue Lasix 80 mg daily + 40 KCL daily. Will have her take lasix 80 bid + 40 KCL daily on weekends. - Continue Coreg 6.25 mg bid. Follow carefully with baseline conduction system disease.  - Continue Farxiga 10 mg daily. - Continue Entresto 24/26 mg bid. - Continue hydralazine 12.5 mg tid + Imdur 15 mg daily. - Off spiro d/t hyperkalemia.  - Labs today   2. CAD S/P CABG x4: - LHC 04/20/22: LIMA to LAD and SVG to OM patent, occluded SVG to RCA and SVG to Ramus - Managing CAD medically as above. - No recent angina. - Continue statin and ASA.  3. Bioprosthetic MVR:  - Echo 1/23: EF 25-30%, trivial MR, mild mitral stenosis with mean gradient 7 mmHg  - RHC 5/23: MV gradient on LV wedge tracing - mean 13, MVA 0.93 cm2, suggestive of severe MS (likely overestimated when compared to echo) - Echo (5/24) with mean gradient 8 mmHg, no mitral stenosis - Continue warfarin and ASA. Denies bleeding. - Aware of SBE prophylaxis   4. VT/PVCs:  - 7 day Zio, 04/23: 2.3% PVCs, sustained VT (longest 27 minutes) - s/p ICD implant 06/30/22.  - No ectopy of ECG today. Continues with palpitations - Check labs and magnesium - ? Sleep study  5. PAF - NSR on ECG today. - Continue warfarin. Denies bleeding - INR followed by Coumadin Clinic  6. DM2 - Per PCP  7. CKD 3 - Baseline SCr 1.4-1.6 - BMET today. - Continue Farxiga   8. Goiter - Thyroid nodule biopsied 10/20, benign follicular nodule - TSH 0.436 and free T4 1.10 (4/24) - no longer follows with Endo, PCP manages.   Follow up in 3 months with Dr. Gala Romney  Jacklynn Ganong, FNP  2:00 PM

## 2024-01-07 ENCOUNTER — Inpatient Hospital Stay (HOSPITAL_COMMUNITY)
Admission: RE | Admit: 2024-01-07 | Discharge: 2024-01-07 | Disposition: A | Discharge status: Home / Self Care | Payer: No Typology Code available for payment source | Source: Ambulatory Visit | Attending: Internal Medicine | Admitting: Internal Medicine

## 2024-01-07 ENCOUNTER — Ambulatory Visit (HOSPITAL_COMMUNITY)
Admit: 2024-01-07 | Discharge: 2024-01-07 | Disposition: A | Discharge status: Home / Self Care | Payer: No Typology Code available for payment source | Source: Ambulatory Visit | Attending: Family Medicine

## 2024-01-07 ENCOUNTER — Other Ambulatory Visit (HOSPITAL_COMMUNITY): Payer: Self-pay | Admitting: Internal Medicine

## 2024-01-07 ENCOUNTER — Encounter (HOSPITAL_COMMUNITY): Payer: Self-pay

## 2024-01-07 VITALS — BP 118/72 | HR 89 | Wt 183.8 lb

## 2024-01-07 DIAGNOSIS — N183 Chronic kidney disease, stage 3 unspecified: Secondary | ICD-10-CM | POA: Insufficient documentation

## 2024-01-07 DIAGNOSIS — Z951 Presence of aortocoronary bypass graft: Secondary | ICD-10-CM | POA: Insufficient documentation

## 2024-01-07 DIAGNOSIS — I480 Paroxysmal atrial fibrillation: Secondary | ICD-10-CM | POA: Diagnosis not present

## 2024-01-07 DIAGNOSIS — I493 Ventricular premature depolarization: Secondary | ICD-10-CM | POA: Diagnosis not present

## 2024-01-07 DIAGNOSIS — E1122 Type 2 diabetes mellitus with diabetic chronic kidney disease: Secondary | ICD-10-CM | POA: Diagnosis not present

## 2024-01-07 DIAGNOSIS — R9431 Abnormal electrocardiogram [ECG] [EKG]: Secondary | ICD-10-CM | POA: Diagnosis not present

## 2024-01-07 DIAGNOSIS — I2510 Atherosclerotic heart disease of native coronary artery without angina pectoris: Secondary | ICD-10-CM | POA: Diagnosis not present

## 2024-01-07 DIAGNOSIS — I130 Hypertensive heart and chronic kidney disease with heart failure and stage 1 through stage 4 chronic kidney disease, or unspecified chronic kidney disease: Secondary | ICD-10-CM | POA: Insufficient documentation

## 2024-01-07 DIAGNOSIS — R002 Palpitations: Secondary | ICD-10-CM

## 2024-01-07 DIAGNOSIS — E119 Type 2 diabetes mellitus without complications: Secondary | ICD-10-CM

## 2024-01-07 DIAGNOSIS — I472 Ventricular tachycardia, unspecified: Secondary | ICD-10-CM | POA: Diagnosis not present

## 2024-01-07 DIAGNOSIS — Z952 Presence of prosthetic heart valve: Secondary | ICD-10-CM | POA: Diagnosis not present

## 2024-01-07 DIAGNOSIS — I5022 Chronic systolic (congestive) heart failure: Secondary | ICD-10-CM | POA: Diagnosis present

## 2024-01-07 DIAGNOSIS — E049 Nontoxic goiter, unspecified: Secondary | ICD-10-CM | POA: Insufficient documentation

## 2024-01-07 DIAGNOSIS — I255 Ischemic cardiomyopathy: Secondary | ICD-10-CM | POA: Diagnosis present

## 2024-01-07 LAB — PROTIME-INR
INR: 2 — ABNORMAL HIGH (ref 0.8–1.2)
Prothrombin Time: 23.2 s — ABNORMAL HIGH (ref 11.4–15.2)

## 2024-01-07 LAB — BASIC METABOLIC PANEL
Anion gap: 13 (ref 5–15)
BUN: 32 mg/dL — ABNORMAL HIGH (ref 8–23)
CO2: 25 mmol/L (ref 22–32)
Calcium: 9.4 mg/dL (ref 8.9–10.3)
Chloride: 105 mmol/L (ref 98–111)
Creatinine, Ser: 1.42 mg/dL — ABNORMAL HIGH (ref 0.44–1.00)
GFR, Estimated: 40 mL/min — ABNORMAL LOW (ref >60)
Glucose, Bld: 74 mg/dL (ref 70–99)
Potassium: 4.5 mmol/L (ref 3.5–5.1)
Sodium: 143 mmol/L (ref 135–145)

## 2024-01-07 LAB — BRAIN NATRIURETIC PEPTIDE: B Natriuretic Peptide: 980.8 pg/mL — ABNORMAL HIGH (ref 0.0–100.0)

## 2024-01-07 NOTE — Addendum Note (Signed)
Encounter addended by: Faythe Casa, CMA on: 01/07/2024 11:20 AM  Actions taken: Imaging Exam begun

## 2024-01-07 NOTE — Progress Notes (Signed)
Zio patch placed onto patient.  All instructions and information reviewed with patient, they verbalize understanding with no questions. 

## 2024-01-07 NOTE — Patient Instructions (Addendum)
Thank you for coming in today  If you had labs drawn today, any labs that are abnormal the clinic will call you No news is good news  You have been given a prescription for compression hose. And a list of places who supply them. Please wear your compression hose daily, place them on as soon as you get up in the morning and remove before you go to bed at night.  Your provider has recommended that  you wear a Zio Patch for 7 days.  This monitor will record your heart rhythm for our review.  IF you have any symptoms while wearing the monitor please press the button.  If you have any issues with the patch or you notice a red or orange light on it please call the company at [phone].  Once you remove the patch please mail it back to the company as soon as possible so we can get the results.   Medications: No changes  Follow up appointments:  Your physician recommends that you schedule a follow-up appointment in:  3 months With Dr. Gala Romney  You will receive a reminder letter in the mail a few months in advance. If you don't receive a letter, please call our office to schedule the follow-up appointment.    Do the following things EVERYDAY: Weigh yourself in the morning before breakfast. Write it down and keep it in a log. Take your medicines as prescribed Eat low salt foods--Limit salt (sodium) to 2000 mg per day.  Stay as active as you can everyday Limit all fluids for the day to less than 2 liters   At the [facility], you and your health needs are our priority. As part of our continuing mission to provide you with exceptional heart care, we have created designated Provider Care Teams. These Care Teams include your primary Cardiologist (physician) and Advanced Practice Providers (APPs- Physician Assistants and Nurse Practitioners) who all work together to provide you with the care you need, when you need it.   You may see any of the following providers on your  designated Care Team at your next follow up: Dr Arvilla Meres Dr Marca Ancona Dr. Marcos Eke, NP Robbie Lis, [place] [facility], [place] Brynda Peon, NP Karle Plumber, PharmD   Please be sure to bring in all your medications bottles to every appointment.    Thank you for choosing  [facility]  If you have any questions or concerns before your next appointment please send [place] a message through [place] or call our office at [phone].    TO LEAVE A MESSAGE FOR THE NURSE SELECT OPTION 2, PLEASE LEAVE A MESSAGE INCLUDING: YOUR NAME DATE OF BIRTH CALL BACK NUMBER REASON FOR CALL**this is important as we prioritize the call backs  YOU WILL RECEIVE A CALL BACK THE SAME DAY AS LONG AS YOU CALL BEFORE 4:00 PM

## 2024-01-08 ENCOUNTER — Ambulatory Visit (INDEPENDENT_AMBULATORY_CARE_PROVIDER_SITE_OTHER): Payer: Self-pay | Admitting: *Deleted

## 2024-01-08 DIAGNOSIS — Z7901 Long term (current) use of anticoagulants: Secondary | ICD-10-CM

## 2024-01-08 DIAGNOSIS — I480 Paroxysmal atrial fibrillation: Secondary | ICD-10-CM | POA: Diagnosis not present

## 2024-01-08 DIAGNOSIS — Z951 Presence of aortocoronary bypass graft: Secondary | ICD-10-CM | POA: Diagnosis not present

## 2024-01-15 ENCOUNTER — Encounter (HOSPITAL_COMMUNITY): Payer: Self-pay | Admitting: *Deleted

## 2024-01-15 NOTE — Telephone Encounter (Signed)
Spoke w/pt, she just FMLA to cover the week she was out due to hospitalization 1/13-1/17, she returned to work 1/21  Forms completed, signed by Dr Gala Romney, and faxed into [employer] LOA Dept at [phone]

## 2024-01-15 NOTE — Telephone Encounter (Signed)
Have pt's FMLA forms, unsure what exactly is needed, pt was hospitalized 1/13-1/17 and had f/u appt on 2/3 ?RTW  Left message to call back

## 2024-01-28 NOTE — Addendum Note (Signed)
 Encounter addended by: Crissie Figures, RN on: 01/28/2024 1:52 PM  Actions taken: Imaging Exam ended

## 2024-02-04 ENCOUNTER — Encounter: Payer: Self-pay | Admitting: Internal Medicine

## 2024-02-05 ENCOUNTER — Ambulatory Visit: Payer: No Typology Code available for payment source

## 2024-02-08 ENCOUNTER — Ambulatory Visit: Payer: No Typology Code available for payment source | Attending: Cardiovascular Disease

## 2024-02-08 DIAGNOSIS — Z951 Presence of aortocoronary bypass graft: Secondary | ICD-10-CM | POA: Diagnosis not present

## 2024-02-08 DIAGNOSIS — Z7901 Long term (current) use of anticoagulants: Secondary | ICD-10-CM

## 2024-02-08 LAB — POCT INR: INR: 2.9 (ref 2.0–3.0)

## 2024-02-08 NOTE — Patient Instructions (Addendum)
 Description   Continue taking 1 tablet (5mg ) daily.  Recheck INR in 5 weeks.  Coumadin Clinic  [phone]

## 2024-02-12 NOTE — Progress Notes (Signed)
 Remote ICD transmission.

## 2024-02-22 ENCOUNTER — Other Ambulatory Visit (HOSPITAL_COMMUNITY): Payer: Self-pay | Admitting: Cardiology

## 2024-02-22 MED ORDER — TORSEMIDE 20 MG PO TABS: 40.0000 mg | ORAL_TABLET | Freq: Every day | ORAL | 1 refills | Status: DC

## 2024-02-26 ENCOUNTER — Other Ambulatory Visit (HOSPITAL_COMMUNITY): Payer: Self-pay

## 2024-03-01 ENCOUNTER — Other Ambulatory Visit (HOSPITAL_COMMUNITY): Payer: Self-pay | Admitting: Internal Medicine

## 2024-03-14 ENCOUNTER — Ambulatory Visit: Attending: Cardiovascular Disease

## 2024-03-14 DIAGNOSIS — I480 Paroxysmal atrial fibrillation: Secondary | ICD-10-CM

## 2024-03-14 DIAGNOSIS — Z951 Presence of aortocoronary bypass graft: Secondary | ICD-10-CM

## 2024-03-14 DIAGNOSIS — Z7901 Long term (current) use of anticoagulants: Secondary | ICD-10-CM | POA: Diagnosis not present

## 2024-03-14 LAB — POCT INR: INR: 1.8 — AB (ref 2.0–3.0)

## 2024-03-14 NOTE — Patient Instructions (Signed)
 Take 1.5 tablets today only then Continue taking 1 tablet (5mg ) daily.  Recheck INR in 5 weeks.  Coumadin Clinic  [phone]

## 2024-03-18 ENCOUNTER — Other Ambulatory Visit (HOSPITAL_COMMUNITY): Payer: Self-pay | Admitting: Internal Medicine

## 2024-04-02 ENCOUNTER — Ambulatory Visit: Payer: No Typology Code available for payment source

## 2024-04-02 DIAGNOSIS — I255 Ischemic cardiomyopathy: Secondary | ICD-10-CM | POA: Diagnosis not present

## 2024-04-03 LAB — CUP PACEART REMOTE DEVICE CHECK
Battery Remaining Longevity: 162 mo
Battery Remaining Percentage: 100 %
Brady Statistic RV Percent Paced: 0 %
Date Time Interrogation Session: 20250430020100
HighPow Impedance: 63 Ohm
Implantable Lead Connection Status: 753985
Implantable Lead Implant Date: 20230728
Implantable Lead Location: 753860
Implantable Lead Model: 138
Implantable Lead Serial Number: 303552
Implantable Pulse Generator Implant Date: 20230728
Lead Channel Impedance Value: 428 Ohm
Lead Channel Pacing Threshold Amplitude: 0.8 V
Lead Channel Pacing Threshold Pulse Width: 0.4 ms
Lead Channel Setting Pacing Amplitude: 2.5 V
Lead Channel Setting Pacing Pulse Width: 0.4 ms
Lead Channel Setting Sensing Sensitivity: 0.6 mV
Pulse Gen Serial Number: 217674

## 2024-04-18 ENCOUNTER — Ambulatory Visit

## 2024-05-07 ENCOUNTER — Ambulatory Visit: Attending: Cardiovascular Disease | Admitting: *Deleted

## 2024-05-07 DIAGNOSIS — Z7901 Long term (current) use of anticoagulants: Secondary | ICD-10-CM | POA: Diagnosis not present

## 2024-05-07 DIAGNOSIS — I480 Paroxysmal atrial fibrillation: Secondary | ICD-10-CM

## 2024-05-07 DIAGNOSIS — Z951 Presence of aortocoronary bypass graft: Secondary | ICD-10-CM

## 2024-05-07 LAB — POCT INR: INR: 2 (ref 2.0–3.0)

## 2024-05-07 NOTE — Patient Instructions (Signed)
 Description   Take 1.5 tablets today then continue taking 1 tablet (5mg ) daily.  Recheck INR in 5 weeks.  Coumadin  Clinic  [phone]

## 2024-05-15 NOTE — Progress Notes (Signed)
 Remote ICD transmission.

## 2024-05-15 NOTE — Addendum Note (Signed)
 Addended by: Lott Rouleau A on: 05/15/2024 01:40 PM   Modules accepted: Orders

## 2024-05-21 ENCOUNTER — Other Ambulatory Visit (HOSPITAL_COMMUNITY): Payer: Self-pay | Admitting: Internal Medicine

## 2024-05-28 ENCOUNTER — Other Ambulatory Visit (HOSPITAL_COMMUNITY): Payer: Self-pay

## 2024-05-29 ENCOUNTER — Telehealth (HOSPITAL_COMMUNITY): Payer: Self-pay

## 2024-05-29 ENCOUNTER — Other Ambulatory Visit (HOSPITAL_COMMUNITY): Payer: Self-pay

## 2024-05-29 NOTE — Telephone Encounter (Signed)
 Spoke with patient by phone, she will call pharmacy to confirm, and go to pick up medication. Advised patient to contact me if there are further issues or complications.

## 2024-05-29 NOTE — Telephone Encounter (Signed)
 Advanced Heart Failure Patient Advocate Encounter  Patient contacted office with concerns regarding copay. Patient stated copay of $600+ until deductible is met. I am unable to process test claims for this plan at this time, as [facility] is not contracted with this coverage currently.  Spoke to [facility], confirmed Entresto  is ready and is billed to a copay card for $10 to pick up. Left voicemail for pt.  Rachel DEL, CPhT Rx Patient Advocate Phone: [phone]

## 2024-06-03 NOTE — Progress Notes (Signed)
 Anticoag encounter

## 2024-06-11 ENCOUNTER — Ambulatory Visit: Attending: Cardiovascular Disease | Admitting: *Deleted

## 2024-06-11 DIAGNOSIS — Z7901 Long term (current) use of anticoagulants: Secondary | ICD-10-CM | POA: Diagnosis not present

## 2024-06-11 DIAGNOSIS — Z951 Presence of aortocoronary bypass graft: Secondary | ICD-10-CM

## 2024-06-11 DIAGNOSIS — I480 Paroxysmal atrial fibrillation: Secondary | ICD-10-CM | POA: Diagnosis not present

## 2024-06-11 LAB — POCT INR: INR: 2.3 (ref 2.0–3.0)

## 2024-06-11 NOTE — Progress Notes (Signed)
Please see anticoagulation encounter.

## 2024-06-11 NOTE — Patient Instructions (Signed)
 Description   Continue taking warfarin 1 tablet (5mg ) daily.  Recheck INR in 6 weeks.  Coumadin  Clinic  [phone]

## 2024-07-02 ENCOUNTER — Ambulatory Visit: Payer: No Typology Code available for payment source

## 2024-07-02 DIAGNOSIS — I255 Ischemic cardiomyopathy: Secondary | ICD-10-CM | POA: Diagnosis not present

## 2024-07-02 LAB — CUP PACEART REMOTE DEVICE CHECK
Battery Remaining Longevity: 162 mo
Battery Remaining Percentage: 100 %
Brady Statistic RV Percent Paced: 0 %
Date Time Interrogation Session: 20250730020000
HighPow Impedance: 62 Ohm
Implantable Lead Connection Status: 753985
Implantable Lead Implant Date: 20230728
Implantable Lead Location: 753860
Implantable Lead Model: 138
Implantable Lead Serial Number: 303552
Implantable Pulse Generator Implant Date: 20230728
Lead Channel Impedance Value: 434 Ohm
Lead Channel Pacing Threshold Amplitude: 0.7 V
Lead Channel Pacing Threshold Pulse Width: 0.4 ms
Lead Channel Setting Pacing Amplitude: 2.5 V
Lead Channel Setting Pacing Pulse Width: 0.4 ms
Lead Channel Setting Sensing Sensitivity: 0.6 mV
Pulse Gen Serial Number: 217674

## 2024-07-05 ENCOUNTER — Ambulatory Visit: Payer: Self-pay | Admitting: Cardiology

## 2024-07-07 NOTE — Progress Notes (Signed)
 Advanced Heart Failure Clinic Note   PCP: Sun, Vyvyan, MD Primary Cardiologist: Dr. Raford HF Cardiologist: Dr. Cherrie  HPI: 68 y.o. 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   She was previously followed by Dr. Cherrie in Advanced Heart Failure clinic, graduated from clinic in 05/2019. Echo 05/2019: EF 35-40%. Has been followed by Dr. Raford with Cardiology.  Echo 1/22: EF 30-35%  Echo 1/23: EF 25-30%, RV moderately reduced, RVSP 38.8 mmHg, trivial MR, mean gradient 7 mmHg across mitral valve prosthesis, IVC dilated with estimated RAP 15 mmHg  Saw EP in 3/23. D/t presence of progressive conduction system disease workup for amyloidosis recommended. Unable to tolerate cMRI d/t claustrophobia.   7 day Zio 4/23: SR with avg rate 83 bpm, 1st degree AVB and intermittent Wenckebach AV block noted. 2.3% PVCs. 6 runs VT, longest 27 min and 48 seconds. ICD recommended. She wanted to discuss with family before proceeding.  Admitted 5/23 with a/c CHF. Diuresed with IV lasix . Discharged on furosemide  80 mg daily. GDMT - coreg  3.125 mg BID and dapagliflozin  10 mg daily.   Seen in [facility] clinic 04/17/22. Added back Entresto , later stopped when she developed hyperkalemia. Felt better when she was on Entresto  in the past. LifeVest placed dt/ episode sustained VT on monitor in April.  PYP scan 04/20/22 equivocal for TTR amyloidosis.  R/LHC 04/20/22: LIMA to LAD and SVG to OM patent, occluded SVG to RCA and SVG to Ramus, normal LVEDP, moderate mixed PAH, severe mitral stenosis (felt to be overestimated). Med management of CAD recommended, if has refractory angina could consider CTO RCA.  S/p Boston SCI ICD implant 7/23.   Seen in ED 09/14/22 with SOB. Given IV lasix  with improvement of symptoms.  Echo 5/24 showed EF 25-30%, RV moderately reduced, stable mitral valve  prosthesis, mild to moderate TR  EP arranged cardiac PET to evaluate for sarcoidosis. PET 10/24 showed diffuse FDG uptake, suggesting poor prep, LVEF 21%. Inconclusive study.  Admitted 1/25 with a/c HF. Diuresed with IV lasix . Echo showed EF 20-25%, RV moderately down, stable MV valve with trivial MR. GDMT titrated, Lasix  switched to torsemide . She was discharged home, weight 178 lbs.  Zio 2/25: NSR (versus atrial pacing) with 1AVB - avg HR of 83 bpm. 8 runs NSVT. 60 runs SVT, longest lasting 18.8 seconds. Occasional PVCs 3.2%  Today she returns for AHF follow up. Overall feeling ok. Denies palpitations, CP, dizziness, or PND/Orthopnea. Chronic swelling in BLE. SOB with activity. Appetite good, eats out a lot. Uses very little salt when she cooks at home. No fever or chills. Weight at home 178-179 pounds. Taking all medications. Denies ETOH, tobacco or drug use. Walks outside when the weather is nice. 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    Cardiac Studies - Echo 1/25: EF 20-25%, RV moderately down, stable bioprosthetic MV valve with trivial MR - Echo (5/24): EF 25-30%, RV moderately reduced, stable mitral valve prosthesis, mild to moderate TR - R/LHC (5/23): Severe 3v CAD, LIMA to LAD and SVG to OM-1 widely patent,  SVG to RCA and SVG to Ramus occluded,  LVEF 25%,  Normal LVEDP. Moderate mixed PAH. Apparent severe mitral stenosis (suspect overestimated based on echo). Ao = 126/65 (90) , LV = 114/13, RA =  10, RV = 52/9, PA = 50/18 (33), PCW = 22 (v = 30), Fick CO/CI = 4.6/2.6, PVR =  2.4 WU, FA sat 94%, PA sat = 60%, 63%, MV gradient on LV-wedge tracing = mean 13.0 MVA 0.93cm2, PAPi = 3.2 - PYP (5/23): equivocal for TTR amyloidosis (Bensimhon read: completely negative) - Zio (4/23): SR with avg rate 83 bpm, 1st degree AVB and intermittent Wenckebach AV block noted. 2.3% PVCs. 6 runs VT, longest 27 min and 48 seconds.  - Echo (1/23): EF 25-30%, RV moderately reduced, RVSP 38.8 mmHg,  trivial MR, mean gradient 7 mmHg across mitral valve prosthesis, IVC dilated with estimated RAP 15 mmHg - Echo (1/22): EF 30-35% - Echo (6/20): EF 35-40%.   Review of Systems: Cardiac and respiratory. Negative except as mentioned in HPI.  Past Medical History:  Diagnosis Date   Arrhythmia    Hx PAF, PVC's   CHF (congestive heart failure) (HCC)    Coronary artery disease    04/04/2017 STEMI   Diabetes [facility])    H/O mitral valve replacement 09/21/2021   Heart attack (HCC)    per pt reprot   Hyperlipidemia    Hypertension    New onset atrial fibrillation (HCC) 05/2017   Current Outpatient Medications  Medication Sig Dispense Refill   acetaminophen  (TYLENOL ) 325 MG tablet Take 2 tablets (650 mg total) by mouth every 6 (six) hours as needed for mild pain or headache.     aspirin  EC 81 MG tablet Take 81 mg by mouth daily.     atorvastatin  (LIPITOR ) 40 MG tablet Take 1 tablet (40 mg total) by mouth daily. 90 tablet 2   carvedilol  (COREG ) 12.5 MG tablet Take 1 tablet (12.5 mg total) by mouth 2 (two) times daily. 180 tablet 3   dapagliflozin  propanediol (FARXIGA ) 10 MG TABS tablet Take 10 mg by mouth daily.     DM-APAP-CPM (CORICIDIN HBP PO) Take 2 tablets by mouth daily as needed (congestion).     ENTRESTO  24-26 MG TAKE 1 TABLET BY MOUTH TWICE A DAY 60 tablet 3   glipiZIDE  (GLUCOTROL  XL) 10 MG 24 hr tablet Take 1 tablet (10 mg total) by mouth daily with breakfast.     metFORMIN  (GLUCOPHAGE ) 1000 MG tablet Take 1,000 mg by mouth 2 (two) times daily with a meal.     RYBELSUS 14 MG TABS Take 1 tablet by mouth every morning.     torsemide  (DEMADEX ) 20 MG tablet PLEASE SEE ATTACHED FOR DETAILED DIRECTIONS 180 tablet 3   warfarin (COUMADIN ) 5 MG tablet TAKE 1 TABLET BY MOUTH DAILY OR AS DIRECTED BY COUMADIN  CLINIC (Patient taking differently: Take 5 mg by mouth daily. TAKE 1 TABLET BY MOUTH DAILY OR AS DIRECTED BY COUMADIN  CLINIC) 90 tablet 1   hydrALAZINE  (APRESOLINE ) 25 MG tablet Take 1 tablet  (25 mg total) by mouth 3 (three) times daily. 270 tablet 1   isosorbide  mononitrate (IMDUR ) 30 MG 24 hr tablet Take 1 tablet (30 mg total) by mouth daily. 90 tablet 1   potassium chloride  SA (KLOR-CON  M) 20 MEQ tablet Take 2 tablets by mouth daily. (Patient not taking: Reported on 07/09/2024) 30 tablet 11   No current facility-administered medications for this encounter.   Allergies  Allergen Reactions   Keflex  [Cephalexin ] Rash    Skin on feet peeled.   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  Tobacco Use   Smoking status: Never   Smokeless tobacco: Never  Vaping Use   Vaping status: Never Used  Substance and Sexual Activity   Alcohol use: No   Drug use: Yes    Types: Marijuana    Comment: In her 52 's   Sexual activity: Not on file  Other Topics Concern   Not on file  Social History Narrative   Not on file   Social Drivers of Health   Financial Resource Strain: Not on file  Food Insecurity: No Food Insecurity (12/18/2023)   Hunger Vital Sign    Worried About Running Out of Food in the Last Year: Never true    Ran Out of Food in the Last Year: Never true  Transportation Needs: No Transportation Needs (12/18/2023)   PRAPARE - Administrator, Civil Service (Medical): No    Lack of Transportation (Non-Medical): No  Physical Activity: Not on file  Stress: Not on file  Social Connections: Moderately Isolated (12/18/2023)   Social Connection and Isolation Panel    Frequency of Communication with Friends and Family: More than three times a week    Frequency of Social Gatherings with Friends and Family: More than three times a week     Attends Religious Services: Never    Database administrator or Organizations: No    Attends Banker Meetings: Never    Marital Status: Married  Catering manager Violence: Unknown (12/17/2023)   Humiliation, Afraid, Rape, and Kick questionnaire    Fear of Current or Ex-Partner: No    Emotionally Abused: No    Physically Abused: Not on file    Sexually Abused: No   Family History  Problem Relation Age of Onset   Heart attack Father 94   Breast cancer Maternal Grandmother    BP (!) 140/82   Pulse 89   Ht 5' 2 (1.575 m)   Wt 81.6 kg (179 lb 12.8 oz)   SpO2 99%   BMI 32.89 kg/m   Wt Readings from Last 3 Encounters:  07/09/24 81.6 kg (179 lb 12.8 oz)  01/07/24 83.4 kg (183 lb 12.8 oz)  12/21/23 81.3 kg (179 lb 4.8 oz)   PHYSICAL EXAM: General:  well appearing.  No respiratory difficulty. Walked into clinic.  Neck: JVD ~6 cm.  Cor: Regular rate & rhythm. No murmurs. Lungs: clear Extremities: +1 BLE no edema  Neuro: alert & oriented x 3. Affect pleasant.   ECG (personally reviewed): wide QRS with PVCs, RBBB 80s  BSCi ICD interrogation (personally reviewed): HL score 4, impedance ok, S3 stable, activity level 0.6 hr/day, average HR 80 bpm. Several NSVT runs noted. VF May 6th, no therapy required  ASSESSMENT & PLAN: Chronic Systolic Heart Failure/ICM - Echo (02/06/2018): EF 35-40% - Echo (6/20): EF 35-40% - Echo (1/22): EF 30-35% - Echo (1/23): EF 25-30% (appears closer to 20% on review with Dr. Cherrie) - Sustained VT on zio 04/23. Wore LifeVest while deciding on ICD. - R/LHC (04/20/22): LIMA to LAD and SVG to OM patent, occluded SVG to RCA and SVG to Ramus, normal LVEDP, moderate mixed PAH, severe mitral stenosis (felt to be overestimated). Med management of CAD recommended, if has refractory angina could consider CTO RCA. - Unable to tolerate MRI d/t claustrophobia.  - PYP scan (5/23): negative. -  s/p [employer] SCI ICD 7/23 - Echo (5/24): EF 25-30%, RV  moderately reduced, stable mitral valve prosthesis, mild to moderate TR - Echo 1/25: EF 20-25%, RV moderately down, stable bioprosthetic MV valve with trivial MR - NYHA II. Volume ok on exam and by device. - Continue torsemide  40 mg daily + 20 KCL daily. - Continue Coreg  6.25 mg bid. Follow carefully with baseline conduction system disease.  - Continue Farxiga  10 mg daily. - Continue Entresto  24/26 mg bid. - Increase hydralazine  12.5>25 mg tid + Increase Imdur  15>30 mg daily. - Off spiro d/t hyperkalemia.  - Labs today - Encouraged to wear compression socks with sedentary job.    2. CAD S/P CABG x4: - LHC 04/20/22: LIMA to LAD and SVG to OM patent, occluded SVG to RCA and SVG to Ramus - Managing CAD medically as above. - No recent angina. - Continue statin and ASA.  3. Bioprosthetic MVR:  - Echo 1/23: EF 25-30%, trivial MR, mild mitral stenosis with mean gradient 7 mmHg  - RHC 5/23: MV gradient on LV wedge tracing - mean 13, MVA 0.93 cm2, suggestive of severe MS (likely overestimated when compared to echo) - Echo (5/24) with mean gradient 8 mmHg, no mitral stenosis - Echo 1/25 with mean gradient 8 mmHg, trivial MR - Continue warfarin and ASA. Denies bleeding. - Aware of SBE prophylaxis   4. VT/PVCs:  - 7 day Zio, 04/23: 2.3% PVCs, sustained VT (longest 27 minutes) - s/p ICD implant 06/30/22.  - Device interrogation as above - Zio 2/25 with 3.2% PVCs - Cardiac PET as above, needs repeat. Patient will call for EP follow up.   5. PAF - Appears SR with PVCs on ECG today. - Continue warfarin. Denies bleeding - INR followed by Coumadin  Clinic - Zio 2/25: NSR (versus atrial pacing) with 1AVB - avg HR of 83 bpm. 8 runs NSVT. 60 runs SVT, longest lasting 18.8 seconds. Occasional PVCs 3.2%  6. DM2 - Most recent A1C 7.9 - She is not on insulin  - Per PCP  7. CKD 3 - Baseline SCr 1.4-1.6 - Continue Farxiga   - BMET today.  8. Goiter - Thyroid  nodule biopsied 10/20, benign follicular  nodule - TSH 0.436 and free T4 1.10 (4/24) - no longer follows with Endo, PCP manages.    Follow up in 3 months with Dr. Cherrie Beckey LITTIE Hayes, NP  2:02 PM

## 2024-07-08 ENCOUNTER — Encounter: Payer: Self-pay | Admitting: Cardiology

## 2024-07-08 ENCOUNTER — Telehealth: Payer: Self-pay | Admitting: Cardiology

## 2024-07-08 ENCOUNTER — Telehealth (HOSPITAL_COMMUNITY): Payer: Self-pay

## 2024-07-08 MED ORDER — CARVEDILOL 12.5 MG PO TABS: 12.5000 mg | ORAL_TABLET | Freq: Two times a day (BID) | ORAL | 3 refills | Status: AC

## 2024-07-08 NOTE — Telephone Encounter (Signed)
 Called to confirm/remind patient of their appointment at the Advanced [facility] Failure Clinic on 8/6/251:30.   Appointment:   [x] Confirmed  [] Left mess   [] No answer/No voice mail  [] VM Full/unable to leave message  [] Phone not in service  Patient reminded to bring all medications and/or complete list.  Confirmed patient has transportation. Gave directions, instructed to utilize valet parking.

## 2024-07-08 NOTE — Telephone Encounter (Signed)
 error

## 2024-07-08 NOTE — Telephone Encounter (Addendum)
 Spoke to patient Dr.Camnitz reviewed recent Defib interrogation.Advised continue to monitor fast heart beat.He advised increase Coreg  to 12.5 mg twice a day.She is due for a follow up visit with Dr.Camnitz.I will send message to Dr.Camnitz's RN to schedule.

## 2024-07-08 NOTE — Telephone Encounter (Signed)
 Pt calling back to f/u with results. Please advise.

## 2024-07-09 ENCOUNTER — Ambulatory Visit (HOSPITAL_COMMUNITY)
Admission: RE | Admit: 2024-07-09 | Discharge: 2024-07-09 | Disposition: A | Discharge status: Home / Self Care | Source: Ambulatory Visit | Attending: Internal Medicine | Admitting: Internal Medicine

## 2024-07-09 ENCOUNTER — Encounter (HOSPITAL_COMMUNITY): Payer: Self-pay

## 2024-07-09 ENCOUNTER — Ambulatory Visit (HOSPITAL_COMMUNITY): Payer: Self-pay | Admitting: Internal Medicine

## 2024-07-09 VITALS — BP 140/82 | HR 89 | Ht 62.0 in | Wt 179.8 lb

## 2024-07-09 DIAGNOSIS — I440 Atrioventricular block, first degree: Secondary | ICD-10-CM | POA: Diagnosis not present

## 2024-07-09 DIAGNOSIS — Z953 Presence of xenogenic heart valve: Secondary | ICD-10-CM | POA: Diagnosis not present

## 2024-07-09 DIAGNOSIS — Z7984 Long term (current) use of oral hypoglycemic drugs: Secondary | ICD-10-CM | POA: Insufficient documentation

## 2024-07-09 DIAGNOSIS — E875 Hyperkalemia: Secondary | ICD-10-CM | POA: Diagnosis not present

## 2024-07-09 DIAGNOSIS — N183 Chronic kidney disease, stage 3 unspecified: Secondary | ICD-10-CM | POA: Insufficient documentation

## 2024-07-09 DIAGNOSIS — I5022 Chronic systolic (congestive) heart failure: Secondary | ICD-10-CM | POA: Diagnosis not present

## 2024-07-09 DIAGNOSIS — I472 Ventricular tachycardia, unspecified: Secondary | ICD-10-CM | POA: Insufficient documentation

## 2024-07-09 DIAGNOSIS — I130 Hypertensive heart and chronic kidney disease with heart failure and stage 1 through stage 4 chronic kidney disease, or unspecified chronic kidney disease: Secondary | ICD-10-CM | POA: Diagnosis not present

## 2024-07-09 DIAGNOSIS — I493 Ventricular premature depolarization: Secondary | ICD-10-CM | POA: Insufficient documentation

## 2024-07-09 DIAGNOSIS — Z7982 Long term (current) use of aspirin: Secondary | ICD-10-CM | POA: Diagnosis not present

## 2024-07-09 DIAGNOSIS — E1122 Type 2 diabetes mellitus with diabetic chronic kidney disease: Secondary | ICD-10-CM | POA: Insufficient documentation

## 2024-07-09 DIAGNOSIS — I255 Ischemic cardiomyopathy: Secondary | ICD-10-CM | POA: Insufficient documentation

## 2024-07-09 DIAGNOSIS — Z79899 Other long term (current) drug therapy: Secondary | ICD-10-CM | POA: Diagnosis not present

## 2024-07-09 DIAGNOSIS — Z7901 Long term (current) use of anticoagulants: Secondary | ICD-10-CM | POA: Insufficient documentation

## 2024-07-09 DIAGNOSIS — R0602 Shortness of breath: Secondary | ICD-10-CM | POA: Insufficient documentation

## 2024-07-09 DIAGNOSIS — Z951 Presence of aortocoronary bypass graft: Secondary | ICD-10-CM | POA: Insufficient documentation

## 2024-07-09 DIAGNOSIS — R002 Palpitations: Secondary | ICD-10-CM

## 2024-07-09 DIAGNOSIS — E119 Type 2 diabetes mellitus without complications: Secondary | ICD-10-CM

## 2024-07-09 DIAGNOSIS — Z952 Presence of prosthetic heart valve: Secondary | ICD-10-CM

## 2024-07-09 DIAGNOSIS — Z95810 Presence of automatic (implantable) cardiac defibrillator: Secondary | ICD-10-CM | POA: Insufficient documentation

## 2024-07-09 DIAGNOSIS — I480 Paroxysmal atrial fibrillation: Secondary | ICD-10-CM | POA: Diagnosis not present

## 2024-07-09 DIAGNOSIS — E041 Nontoxic single thyroid nodule: Secondary | ICD-10-CM | POA: Insufficient documentation

## 2024-07-09 DIAGNOSIS — I2510 Atherosclerotic heart disease of native coronary artery without angina pectoris: Secondary | ICD-10-CM | POA: Insufficient documentation

## 2024-07-09 LAB — BASIC METABOLIC PANEL WITH GFR
Anion gap: 10 (ref 5–15)
BUN: 19 mg/dL (ref 8–23)
CO2: 26 mmol/L (ref 22–32)
Calcium: 9.6 mg/dL (ref 8.9–10.3)
Chloride: 108 mmol/L (ref 98–111)
Creatinine, Ser: 1.33 mg/dL — ABNORMAL HIGH (ref 0.44–1.00)
GFR, Estimated: 43 mL/min — ABNORMAL LOW (ref >60)
Glucose, Bld: 117 mg/dL — ABNORMAL HIGH (ref 70–99)
Potassium: 4.6 mmol/L (ref 3.5–5.1)
Sodium: 144 mmol/L (ref 135–145)

## 2024-07-09 LAB — BRAIN NATRIURETIC PEPTIDE: B Natriuretic Peptide: 1085.7 pg/mL — ABNORMAL HIGH (ref 0.0–100.0)

## 2024-07-09 MED ORDER — ISOSORBIDE MONONITRATE ER 30 MG PO TB24: 30.0000 mg | ORAL_TABLET | Freq: Every day | ORAL | 1 refills | Status: DC

## 2024-07-09 MED ORDER — HYDRALAZINE HCL 25 MG PO TABS: 25.0000 mg | ORAL_TABLET | Freq: Three times a day (TID) | ORAL | 1 refills | Status: DC

## 2024-07-09 NOTE — Patient Instructions (Signed)
 Medication Changes:  INCREASE Hydralazine  to 25 mg (1 tab) Three times a day   INCREASE Imdur  to 30 mg (1 tab) Daily  Lab Work:  Labs done today, your results will be available in MyChart, we will contact you for abnormal readings.  Special Instructions // Education:  Do the following things EVERYDAY: Weigh yourself in the morning before breakfast. Write it down and keep it in a log. Take your medicines as prescribed Eat low salt foods--Limit salt (sodium) to 2000 mg per day.  Stay as active as you can everyday Limit all fluids for the day to less than 2 liters   Follow-Up in: 3 months with Dr Bensimhon (November), **PLEASE CALL OUR OFFICE IN SEPTEMBER TO SCHEDULE THIS APPOINTMENT   At the Advanced [facility] Failure Clinic, you and your health needs are our priority. We have a designated team specialized in the treatment of Heart Failure. This Care Team includes your primary Heart Failure Specialized Cardiologist (physician), Advanced Practice Providers (APPs- Physician Assistants and Nurse Practitioners), and Pharmacist who all work together to provide you with the care you need, when you need it.   You may see any of the following providers on your designated Care Team at your next follow up:  Dr. Toribio Fuel Dr. Ezra Shuck Dr. Ria Commander Dr. Odis Brownie Greig Mosses, NP Caffie Shed, [place] [facility] [place], [place] Beckey Coe, NP [name] Lee, NP Tinnie Redman, PharmD   Please be sure to bring in all your medications bottles to every appointment.   Need to Contact Us :  If you have any questions or concerns before your next appointment please send us  a message through [place] or call our office at [phone].    TO LEAVE A MESSAGE FOR THE NURSE SELECT OPTION 2, PLEASE LEAVE A MESSAGE INCLUDING: YOUR NAME DATE OF BIRTH CALL BACK NUMBER REASON FOR CALL**this is important as we prioritize the call backs  YOU WILL RECEIVE A CALL BACK THE SAME DAY AS  LONG AS YOU CALL BEFORE 4:00 PM

## 2024-07-10 NOTE — Telephone Encounter (Signed)
 Call x1; [email or web address] to schedule from recall for 1 yr f/u + to establish care dt former [facility] patient.

## 2024-07-11 ENCOUNTER — Other Ambulatory Visit: Payer: Self-pay | Admitting: Cardiovascular Disease

## 2024-07-23 ENCOUNTER — Ambulatory Visit: Attending: Cardiovascular Disease

## 2024-07-23 DIAGNOSIS — Z7901 Long term (current) use of anticoagulants: Secondary | ICD-10-CM

## 2024-07-23 DIAGNOSIS — Z951 Presence of aortocoronary bypass graft: Secondary | ICD-10-CM | POA: Diagnosis not present

## 2024-07-23 LAB — POCT INR: INR: 2.1 (ref 2.0–3.0)

## 2024-07-23 NOTE — Progress Notes (Signed)
 INR 2.1; Please see anticoagulation encounter

## 2024-07-23 NOTE — Patient Instructions (Addendum)
 Description   Continue taking warfarin 1 tablet (5mg ) daily.  Recheck INR in 7 weeks.  Coumadin  Clinic  [phone]

## 2024-08-05 ENCOUNTER — Encounter: Admitting: Cardiology

## 2024-09-03 NOTE — Progress Notes (Signed)
 Remote ICD Transmission

## 2024-09-15 ENCOUNTER — Other Ambulatory Visit: Payer: Self-pay

## 2024-09-15 ENCOUNTER — Encounter (HOSPITAL_COMMUNITY): Payer: Self-pay

## 2024-09-15 ENCOUNTER — Emergency Department (HOSPITAL_COMMUNITY)

## 2024-09-15 ENCOUNTER — Inpatient Hospital Stay (HOSPITAL_COMMUNITY)
Admission: EM | Admit: 2024-09-15 | Discharge: 2024-09-20 | DRG: 291 | Disposition: A | Discharge status: Home / Self Care | Attending: Internal Medicine | Admitting: Internal Medicine

## 2024-09-15 DIAGNOSIS — I5023 Acute on chronic systolic (congestive) heart failure: Principal | ICD-10-CM | POA: Diagnosis present

## 2024-09-15 DIAGNOSIS — E1169 Type 2 diabetes mellitus with other specified complication: Secondary | ICD-10-CM | POA: Diagnosis present

## 2024-09-15 DIAGNOSIS — Z23 Encounter for immunization: Secondary | ICD-10-CM

## 2024-09-15 DIAGNOSIS — I480 Paroxysmal atrial fibrillation: Secondary | ICD-10-CM | POA: Diagnosis present

## 2024-09-15 DIAGNOSIS — D6869 Other thrombophilia: Secondary | ICD-10-CM | POA: Diagnosis not present

## 2024-09-15 DIAGNOSIS — I509 Heart failure, unspecified: Secondary | ICD-10-CM | POA: Diagnosis not present

## 2024-09-15 DIAGNOSIS — I2510 Atherosclerotic heart disease of native coronary artery without angina pectoris: Secondary | ICD-10-CM | POA: Diagnosis present

## 2024-09-15 DIAGNOSIS — E1121 Type 2 diabetes mellitus with diabetic nephropathy: Secondary | ICD-10-CM | POA: Diagnosis present

## 2024-09-15 DIAGNOSIS — Z953 Presence of xenogenic heart valve: Secondary | ICD-10-CM

## 2024-09-15 DIAGNOSIS — Z7984 Long term (current) use of oral hypoglycemic drugs: Secondary | ICD-10-CM

## 2024-09-15 DIAGNOSIS — N1832 Chronic kidney disease, stage 3b: Secondary | ICD-10-CM | POA: Diagnosis present

## 2024-09-15 DIAGNOSIS — E66811 Obesity, class 1: Secondary | ICD-10-CM | POA: Diagnosis present

## 2024-09-15 DIAGNOSIS — Z6830 Body mass index (BMI) 30.0-30.9, adult: Secondary | ICD-10-CM

## 2024-09-15 DIAGNOSIS — I130 Hypertensive heart and chronic kidney disease with heart failure and stage 1 through stage 4 chronic kidney disease, or unspecified chronic kidney disease: Principal | ICD-10-CM | POA: Diagnosis present

## 2024-09-15 DIAGNOSIS — E1122 Type 2 diabetes mellitus with diabetic chronic kidney disease: Secondary | ICD-10-CM | POA: Diagnosis present

## 2024-09-15 DIAGNOSIS — Z91148 Patient's other noncompliance with medication regimen for other reason: Secondary | ICD-10-CM

## 2024-09-15 DIAGNOSIS — I252 Old myocardial infarction: Secondary | ICD-10-CM

## 2024-09-15 DIAGNOSIS — E7849 Other hyperlipidemia: Secondary | ICD-10-CM | POA: Diagnosis present

## 2024-09-15 DIAGNOSIS — Z7982 Long term (current) use of aspirin: Secondary | ICD-10-CM

## 2024-09-15 DIAGNOSIS — I440 Atrioventricular block, first degree: Secondary | ICD-10-CM | POA: Diagnosis present

## 2024-09-15 DIAGNOSIS — Z79899 Other long term (current) drug therapy: Secondary | ICD-10-CM

## 2024-09-15 DIAGNOSIS — I10 Essential (primary) hypertension: Secondary | ICD-10-CM | POA: Diagnosis present

## 2024-09-15 DIAGNOSIS — Z7901 Long term (current) use of anticoagulants: Secondary | ICD-10-CM

## 2024-09-15 DIAGNOSIS — Z95810 Presence of automatic (implantable) cardiac defibrillator: Secondary | ICD-10-CM

## 2024-09-15 DIAGNOSIS — Z888 Allergy status to other drugs, medicaments and biological substances status: Secondary | ICD-10-CM

## 2024-09-15 DIAGNOSIS — Z951 Presence of aortocoronary bypass graft: Secondary | ICD-10-CM

## 2024-09-15 DIAGNOSIS — Z8249 Family history of ischemic heart disease and other diseases of the circulatory system: Secondary | ICD-10-CM

## 2024-09-15 DIAGNOSIS — M109 Gout, unspecified: Secondary | ICD-10-CM | POA: Diagnosis present

## 2024-09-15 LAB — CBC
HCT: 42.8 % (ref 36.0–46.0)
Hemoglobin: 13 g/dL (ref 12.0–15.0)
MCH: 22.7 pg — ABNORMAL LOW (ref 26.0–34.0)
MCHC: 30.4 g/dL (ref 30.0–36.0)
MCV: 74.8 fL — ABNORMAL LOW (ref 80.0–100.0)
Platelets: 144 K/uL — ABNORMAL LOW (ref 150–400)
RBC: 5.72 MIL/uL — ABNORMAL HIGH (ref 3.87–5.11)
RDW: 19 % — ABNORMAL HIGH (ref 11.5–15.5)
WBC: 7.3 K/uL (ref 4.0–10.5)
nRBC: 0 % (ref 0.0–0.2)

## 2024-09-15 LAB — BASIC METABOLIC PANEL WITH GFR
Anion gap: 10 (ref 5–15)
BUN: 18 mg/dL (ref 8–23)
CO2: 21 mmol/L — ABNORMAL LOW (ref 22–32)
Calcium: 9.2 mg/dL (ref 8.9–10.3)
Chloride: 107 mmol/L (ref 98–111)
Creatinine, Ser: 1.29 mg/dL — ABNORMAL HIGH (ref 0.44–1.00)
GFR, Estimated: 45 mL/min — ABNORMAL LOW (ref >60)
Glucose, Bld: 127 mg/dL — ABNORMAL HIGH (ref 70–99)
Potassium: 4.3 mmol/L (ref 3.5–5.1)
Sodium: 138 mmol/L (ref 135–145)

## 2024-09-15 LAB — BRAIN NATRIURETIC PEPTIDE: B Natriuretic Peptide: 2832.1 pg/mL — ABNORMAL HIGH (ref 0.0–100.0)

## 2024-09-15 LAB — MAGNESIUM: Magnesium: 2.1 mg/dL (ref 1.7–2.4)

## 2024-09-15 MED ORDER — FUROSEMIDE 10 MG/ML IJ SOLN
40.0000 mg | Freq: Once | INTRAMUSCULAR | Status: AC
  Administered 2024-09-16: 40 mg via INTRAVENOUS
  Filled 2024-09-15: qty 4

## 2024-09-15 NOTE — ED Triage Notes (Signed)
 PT presents for SOB.  Visibly winded.

## 2024-09-15 NOTE — ED Provider Triage Note (Signed)
 Emergency Medicine Provider Triage Evaluation Note  Rachael Davis , a 68 y.o. female  was evaluated in triage.  Pt complains of 1 day of dyspnea on exertion, orthopnea and bilateral lower extremity edema.  Reports she cannot walk very far without having to sit down.  She does have reported pacemaker and defibrillator.  She thinks maybe she has been having an issue with it?  Will interrogate.  Last EF 20-25% in Jan.  Reports she does not take Lasix  daily.  Review of Systems  Positive: SOB Negative: Fever  Physical Exam  BP (!) 152/89 (BP Location: Left Arm)   Pulse (!) 104   Temp 97.9 F (36.6 C)   Resp 18   Ht 5' 2 (1.575 m)   Wt 82 kg   SpO2 100%   BMI 33.06 kg/m  Gen:   Awake, no distress   Resp:  Normal effort, rales MSK:   Moves extremities without difficulty  Other:  BLE edema  Medical Decision Making  Medically screening exam initiated at 2:41 PM.  Appropriate orders placed.  Karinne C Dubiel was informed that the remainder of the evaluation will be completed by another provider, this initial triage assessment does not replace that evaluation, and the importance of remaining in the ED until their evaluation is complete.     Shermon Warren SAILOR, PA-C 09/15/24 1442

## 2024-09-15 NOTE — ED Triage Notes (Signed)
 Pt c/o SHOB that started yesterday. Pt denies pain, states her defibrillator is vibrating so she thinks it is going off. Pt has had nausea and vomiting.

## 2024-09-15 NOTE — ED Provider Notes (Signed)
 Care of patient received from prior provider at 11:19 PM, please see their note for complete H/P and care plan.  Received handoff per ED course.  Clinical Course as of 09/15/24 2319  Mon Sep 15, 2024  2157 Rule out PE 40 IV lace [WB]  2318 Stable  HO from CJT  SOB/Known CHF. Nonadherent to meds.  Pending CTAPE 2/2 cp [CC]    Clinical Course User Index [CC] Jerral Meth, MD [WB] Beam, Elsie, MD    Reassessment: CTA PE study negative.  Appears gross volume overloaded, improving on Lasix .  Consulted medicine for admission.  Disposition:   Based on the above findings, I believe this patient is stable for admission.    Patient/family educated about specific findings on our evaluation and explained exact reasons for admission.  Patient/family educated about clinical situation and time was allowed to answer questions.   Admission team communicated with and agreed with need for admission. Patient admitted. Patient ready to move at this time.     Emergency Department Medication Summary:   Medications  aspirin  EC tablet 81 mg (has no administration in time range)  atorvastatin  (LIPITOR ) tablet 40 mg (has no administration in time range)  carvedilol  (COREG ) tablet 12.5 mg (has no administration in time range)  sacubitril -valsartan  (ENTRESTO ) 24-26 mg per tablet (has no administration in time range)  hydrALAZINE  (APRESOLINE ) tablet 25 mg (has no administration in time range)  isosorbide  mononitrate (IMDUR ) 24 hr tablet 30 mg (has no administration in time range)  insulin  aspart (novoLOG ) injection 0-9 Units (has no administration in time range)  acetaminophen  (TYLENOL ) tablet 650 mg (has no administration in time range)    Or  acetaminophen  (TYLENOL ) suppository 650 mg (has no administration in time range)  furosemide  (LASIX ) injection 40 mg (has no administration in time range)  furosemide  (LASIX ) injection 40 mg (40 mg Intravenous Given 09/16/24 0105)  iohexol  (OMNIPAQUE ) 350  MG/ML injection 75 mL (75 mLs Intravenous Contrast Given 09/16/24 0119)            Jerral Meth, MD 09/16/24 [phone]

## 2024-09-15 NOTE — ED Provider Notes (Signed)
 [place] EMERGENCY DEPARTMENT AT [facility] Provider Note   CSN: [id number] Arrival date & time: 09/15/24  1349     Patient presents with: Shortness of Breath and Leg Swelling   Rachael Davis is a 68 y.o. female.    Shortness of Breath Patient is a 68 year old female with PMH of CAD s/p CABG (2018), ischemic cardiomyopathy with systolic dysfunction (EF 20-25%) s/p PPM, bioprosthetic mitral valve on warfarin, remote atrial fibrillation, and insulin -dependent diabetes, presenting to the ED with chief complaint of x 3 days of progressively worsening shortness of breath, worsened by exertion.  Patient denies any headaches, vision changes, chest pain, cough, congestion, nausea, vomiting, abdominal pain, back pain, fevers, chills.  She reports intermittent loose stools and mild constipation.  Of note, patient reports worsening lower extremity swelling as well with Lasix .     Prior to Admission medications   Medication Sig Start Date End Date Taking? Authorizing Provider  acetaminophen  (TYLENOL ) 325 MG tablet Take 2 tablets (650 mg total) by mouth every 6 (six) hours as needed for mild pain or headache. 05/28/17   Dwan Kyla HERO, PA-C  aspirin  EC 81 MG tablet Take 81 mg by mouth daily.    [provider]  atorvastatin  (LIPITOR ) 40 MG tablet TAKE 1 TABLET BY MOUTH EVERY DAY 07/11/24   Raford Riggs, MD  carvedilol  (COREG ) 12.5 MG tablet Take 1 tablet (12.5 mg total) by mouth 2 (two) times daily. 07/08/24 10/06/24  Camnitz, Soyla Lunger, MD  dapagliflozin  propanediol (FARXIGA ) 10 MG TABS tablet Take 10 mg by mouth daily.    [provider]  DM-APAP-CPM (CORICIDIN HBP PO) Take 2 tablets by mouth daily as needed (congestion).    [provider]  ENTRESTO  24-26 MG TAKE 1 TABLET BY MOUTH TWICE A DAY 05/23/24   Bensimhon, Toribio SAUNDERS, MD  glipiZIDE  (GLUCOTROL  XL) 10 MG 24 hr tablet Take 1 tablet (10 mg total) by mouth daily with breakfast. 05/28/17    Dwan Kyla HERO, PA-C  hydrALAZINE  (APRESOLINE ) 25 MG tablet Take 1 tablet (25 mg total) by mouth 3 (three) times daily. 07/09/24   Hayes Beckey CROME, NP  isosorbide  mononitrate (IMDUR ) 30 MG 24 hr tablet Take 1 tablet (30 mg total) by mouth daily. 07/09/24   Hayes Beckey CROME, NP  metFORMIN  (GLUCOPHAGE ) 1000 MG tablet Take 1,000 mg by mouth 2 (two) times daily with a meal.    [provider]  potassium chloride  SA (KLOR-CON  M) 20 MEQ tablet Take 2 tablets by mouth daily. Patient not taking: Reported on 07/09/2024 07/05/23   Glena Harlene HERO, FNP  RYBELSUS 14 MG TABS Take 1 tablet by mouth every morning.    [provider]  torsemide  (DEMADEX ) 20 MG tablet PLEASE SEE ATTACHED FOR DETAILED DIRECTIONS 03/18/24   Bensimhon, Toribio SAUNDERS, MD  warfarin (COUMADIN ) 5 MG tablet TAKE 1 TABLET BY MOUTH DAILY OR AS DIRECTED BY COUMADIN  CLINIC Patient taking differently: Take 5 mg by mouth daily. TAKE 1 TABLET BY MOUTH DAILY OR AS DIRECTED BY COUMADIN  CLINIC 03/26/23   Raford Riggs, MD    Allergies: Keflex  [cephalexin ], Asa [aspirin ], and Lisinopril     Review of Systems  Respiratory:  Positive for shortness of breath.     Updated Vital Signs BP (!) 154/108 (BP Location: Right Arm)   Pulse 99   Temp (!) 97.5 F (36.4 C)   Resp 20   Ht 5' 2 (1.575 m)   Wt 82 kg   SpO2 100%   BMI  33.06 kg/m   Physical Exam Constitutional:      Appearance: She is well-developed.  HENT:     Head: Normocephalic and atraumatic.     Nose: Nose normal.     Mouth/Throat:     Mouth: Mucous membranes are moist.     Pharynx: Oropharynx is clear.  Eyes:     Extraocular Movements: Extraocular movements intact.     Pupils: Pupils are equal, round, and reactive to light.  Cardiovascular:     Rate and Rhythm: Regular rhythm.     Pulses: Normal pulses.     Heart sounds: Murmur heard.  Pulmonary:     Comments: Conversational dyspnea Abdominal:     General: Abdomen is flat.     Palpations: Abdomen is soft.   Musculoskeletal:        General: Normal range of motion.     Cervical back: Normal range of motion.     Right lower leg: Edema present.     Left lower leg: Edema present.  Skin:    General: Skin is warm and dry.     Capillary Refill: Capillary refill takes less than 2 seconds.  Neurological:     General: No focal deficit present.     Mental Status: She is alert and oriented to person, place, and time.     (all labs ordered are listed, but only abnormal results are displayed) Labs Reviewed  BASIC METABOLIC PANEL WITH GFR - Abnormal; Notable for the following components:      Result Value   CO2 21 (*)    Glucose, Bld 127 (*)    Creatinine, Ser 1.29 (*)    GFR, Estimated 45 (*)    All other components within normal limits  BRAIN NATRIURETIC PEPTIDE - Abnormal; Notable for the following components:   B Natriuretic Peptide 2,832.1 (*)    All other components within normal limits  CBC - Abnormal; Notable for the following components:   RBC 5.72 (*)    MCV 74.8 (*)    MCH 22.7 (*)    RDW 19.0 (*)    Platelets 144 (*)    All other components within normal limits  MAGNESIUM     EKG: EKG Interpretation Date/Time:  Monday September 15 2024 14:46:23 EDT Ventricular Rate:  95 PR Interval:    QRS Duration:  136 QT Interval:  392 QTC Calculation: 492 R Axis:   2  Text Interpretation: Wide QRS rhythm with occasional Premature ventricular complexes Right bundle branch block Inferior infarct , age undetermined Abnormal ECG When compared with ECG of 15-Sep-2024 14:42, PREVIOUS ECG IS PRESENT when compared to prior, similar appearance No STEMI Confirmed by Ginger Barefoot ([id number]) on 09/15/2024 9:03:43 PM  Radiology: [facility] Chest 1 View Result Date: 09/15/2024 CLINICAL DATA:  Shortness of breath. EXAM: CHEST  1 VIEW COMPARISON:  12/17/2023. FINDINGS: Stable cardiomegaly. Status post median sternotomy, CABG, and mitral valve replacement. Left subclavian AICD. Central pulmonary vascular  congestion with possible mild interstitial edema. No sizable pleural effusion. No pneumothorax. No acute osseous abnormality. IMPRESSION: Cardiomegaly with central pulmonary vascular congestion and possible mild interstitial edema. Electronically Signed   By: Harrietta Sherry M.D.   On: 09/15/2024 15:49     Procedures   Medications Ordered in the ED  furosemide  (LASIX ) injection 40 mg (has no administration in time range)    Clinical Course as of 09/15/24 2323  Mon Sep 15, 2024  2157 Rule out PE 40 IV lace [WB]  2318 Stable  HO from CJT  SOB/Known CHF. Nonadherent to meds.  Pending CTAPE 2/2 cp [CC]    Clinical Course User Index [CC] Jerral Meth, MD [WB] Dorcus Fallow, MD                                 Medical Decision Making Amount and/or Complexity of Data Reviewed Radiology: ordered.  Risk Prescription drug management.   Patient is a 68 year old female with PMH as above presenting to the ED with worsening shortness of breath, worsened with exertion, as well as conversational dyspnea.  No chest pain.  No recent syncopal or presyncopal episodes.  No recent falls or trauma.   Upon arrival, patient hypertensive 152/89.  Afebrile.  Sinus tachycardic 104 bpm.  No tachypnea.  Saturating 100% on RA.  GCS 15.  Lungs clear with loud systolic murmur and conversational dyspnea on examination.  CXR indicative of cardiomegaly with mild interstitial edema, consistent with elevated BNP 2832 consistent with volume overload.  Orders placed for 40 mg IV Lasix .  Creatinine 1.29 with eGFR 45, unchanged from baseline.  No evidence of acute renal impairment.  No leukocytosis.  Hemoglobin stable.  EKG reviewed with ST elevation in aVR, concerning for new right heart strain.  Patient notably mildly tachycardic initially in triage.  Right bundle branch also appreciated, unchanged from prior.  She reports that she has been not adherent with her warfarin and Lasix  and she works in a call  center, and this sometimes disrupts her day-to-day function.    CT PE study ordered -at time of signout, final disposition pending CT PE study to rule out evidence of acute pulmonary embolus, however anticipate admission regardless for acute heart failure exacerbation.  Plan of care discussed with oncoming ED team.   Final diagnoses:  Acute on chronic systolic congestive heart failure [facility])    ED Discharge Orders     None          Dorcus Fallow, MD 09/15/24 2323    Tegeler, Lonni PARAS, MD 09/15/24 2358

## 2024-09-16 ENCOUNTER — Encounter (HOSPITAL_COMMUNITY): Payer: Self-pay | Admitting: Internal Medicine

## 2024-09-16 ENCOUNTER — Emergency Department (HOSPITAL_COMMUNITY)

## 2024-09-16 DIAGNOSIS — I480 Paroxysmal atrial fibrillation: Secondary | ICD-10-CM | POA: Diagnosis present

## 2024-09-16 DIAGNOSIS — I509 Heart failure, unspecified: Secondary | ICD-10-CM | POA: Insufficient documentation

## 2024-09-16 DIAGNOSIS — I130 Hypertensive heart and chronic kidney disease with heart failure and stage 1 through stage 4 chronic kidney disease, or unspecified chronic kidney disease: Secondary | ICD-10-CM | POA: Diagnosis present

## 2024-09-16 DIAGNOSIS — I10 Essential (primary) hypertension: Secondary | ICD-10-CM

## 2024-09-16 DIAGNOSIS — I2510 Atherosclerotic heart disease of native coronary artery without angina pectoris: Secondary | ICD-10-CM | POA: Diagnosis present

## 2024-09-16 DIAGNOSIS — Z6830 Body mass index (BMI) 30.0-30.9, adult: Secondary | ICD-10-CM | POA: Diagnosis not present

## 2024-09-16 DIAGNOSIS — E1121 Type 2 diabetes mellitus with diabetic nephropathy: Secondary | ICD-10-CM

## 2024-09-16 DIAGNOSIS — Z7901 Long term (current) use of anticoagulants: Secondary | ICD-10-CM | POA: Diagnosis not present

## 2024-09-16 DIAGNOSIS — M109 Gout, unspecified: Secondary | ICD-10-CM | POA: Diagnosis present

## 2024-09-16 DIAGNOSIS — D6869 Other thrombophilia: Secondary | ICD-10-CM | POA: Diagnosis not present

## 2024-09-16 DIAGNOSIS — I5023 Acute on chronic systolic (congestive) heart failure: Secondary | ICD-10-CM | POA: Diagnosis present

## 2024-09-16 DIAGNOSIS — Z79899 Other long term (current) drug therapy: Secondary | ICD-10-CM | POA: Diagnosis not present

## 2024-09-16 DIAGNOSIS — I252 Old myocardial infarction: Secondary | ICD-10-CM | POA: Diagnosis not present

## 2024-09-16 DIAGNOSIS — Z91148 Patient's other noncompliance with medication regimen for other reason: Secondary | ICD-10-CM | POA: Diagnosis not present

## 2024-09-16 DIAGNOSIS — Z7984 Long term (current) use of oral hypoglycemic drugs: Secondary | ICD-10-CM | POA: Diagnosis not present

## 2024-09-16 DIAGNOSIS — I440 Atrioventricular block, first degree: Secondary | ICD-10-CM | POA: Diagnosis present

## 2024-09-16 DIAGNOSIS — E66811 Obesity, class 1: Secondary | ICD-10-CM | POA: Diagnosis present

## 2024-09-16 DIAGNOSIS — E1122 Type 2 diabetes mellitus with diabetic chronic kidney disease: Secondary | ICD-10-CM | POA: Diagnosis present

## 2024-09-16 DIAGNOSIS — Z951 Presence of aortocoronary bypass graft: Secondary | ICD-10-CM | POA: Diagnosis not present

## 2024-09-16 DIAGNOSIS — R0609 Other forms of dyspnea: Secondary | ICD-10-CM | POA: Diagnosis not present

## 2024-09-16 DIAGNOSIS — N1832 Chronic kidney disease, stage 3b: Secondary | ICD-10-CM | POA: Diagnosis present

## 2024-09-16 DIAGNOSIS — Z8249 Family history of ischemic heart disease and other diseases of the circulatory system: Secondary | ICD-10-CM | POA: Diagnosis not present

## 2024-09-16 DIAGNOSIS — Z23 Encounter for immunization: Secondary | ICD-10-CM | POA: Diagnosis present

## 2024-09-16 DIAGNOSIS — Z95810 Presence of automatic (implantable) cardiac defibrillator: Secondary | ICD-10-CM | POA: Diagnosis not present

## 2024-09-16 DIAGNOSIS — E1169 Type 2 diabetes mellitus with other specified complication: Secondary | ICD-10-CM | POA: Diagnosis present

## 2024-09-16 DIAGNOSIS — Z953 Presence of xenogenic heart valve: Secondary | ICD-10-CM | POA: Diagnosis not present

## 2024-09-16 DIAGNOSIS — E7849 Other hyperlipidemia: Secondary | ICD-10-CM | POA: Diagnosis present

## 2024-09-16 DIAGNOSIS — Z7982 Long term (current) use of aspirin: Secondary | ICD-10-CM | POA: Diagnosis not present

## 2024-09-16 LAB — TSH: TSH: 1.041 u[IU]/mL (ref 0.350–4.500)

## 2024-09-16 LAB — BASIC METABOLIC PANEL WITH GFR
Anion gap: 15 (ref 5–15)
BUN: 17 mg/dL (ref 8–23)
CO2: 21 mmol/L — ABNORMAL LOW (ref 22–32)
Calcium: 9.1 mg/dL (ref 8.9–10.3)
Chloride: 102 mmol/L (ref 98–111)
Creatinine, Ser: 1.39 mg/dL — ABNORMAL HIGH (ref 0.44–1.00)
GFR, Estimated: 41 mL/min — ABNORMAL LOW (ref >60)
Glucose, Bld: 171 mg/dL — ABNORMAL HIGH (ref 70–99)
Potassium: 4.3 mmol/L (ref 3.5–5.1)
Sodium: 138 mmol/L (ref 135–145)

## 2024-09-16 LAB — HEMOGLOBIN A1C
Hgb A1c MFr Bld: 6.6 % — ABNORMAL HIGH (ref 4.8–5.6)
Mean Plasma Glucose: 142.72 mg/dL

## 2024-09-16 LAB — GLUCOSE, CAPILLARY
Glucose-Capillary: 128 mg/dL — ABNORMAL HIGH (ref 70–99)
Glucose-Capillary: 146 mg/dL — ABNORMAL HIGH (ref 70–99)

## 2024-09-16 LAB — CBC WITH DIFFERENTIAL/PLATELET
Abs Immature Granulocytes: 0.04 K/uL (ref 0.00–0.07)
Basophils Absolute: 0 K/uL (ref 0.0–0.1)
Basophils Relative: 1 %
Eosinophils Absolute: 0 K/uL (ref 0.0–0.5)
Eosinophils Relative: 0 %
HCT: 44 % (ref 36.0–46.0)
Hemoglobin: 13.3 g/dL (ref 12.0–15.0)
Immature Granulocytes: 1 %
Lymphocytes Relative: 11 %
Lymphs Abs: 0.9 K/uL (ref 0.7–4.0)
MCH: 22.7 pg — ABNORMAL LOW (ref 26.0–34.0)
MCHC: 30.2 g/dL (ref 30.0–36.0)
MCV: 75 fL — ABNORMAL LOW (ref 80.0–100.0)
Monocytes Absolute: 0.6 K/uL (ref 0.1–1.0)
Monocytes Relative: 7 %
Neutro Abs: 6.7 K/uL (ref 1.7–7.7)
Neutrophils Relative %: 80 %
Platelets: 155 K/uL (ref 150–400)
RBC: 5.87 MIL/uL — ABNORMAL HIGH (ref 3.87–5.11)
RDW: 19.3 % — ABNORMAL HIGH (ref 11.5–15.5)
WBC: 8.3 K/uL (ref 4.0–10.5)
nRBC: 0 % (ref 0.0–0.2)

## 2024-09-16 LAB — HEPATIC FUNCTION PANEL
ALT: 21 U/L (ref 0–44)
AST: 25 U/L (ref 15–41)
Albumin: 4 g/dL (ref 3.5–5.0)
Alkaline Phosphatase: 136 U/L — ABNORMAL HIGH (ref 38–126)
Bilirubin, Direct: 0.3 mg/dL — ABNORMAL HIGH (ref 0.0–0.2)
Indirect Bilirubin: 0.7 mg/dL (ref 0.3–0.9)
Total Bilirubin: 1 mg/dL (ref 0.0–1.2)
Total Protein: 7.5 g/dL (ref 6.5–8.1)

## 2024-09-16 LAB — MAGNESIUM: Magnesium: 2 mg/dL (ref 1.7–2.4)

## 2024-09-16 LAB — PROTIME-INR
INR: 2 — ABNORMAL HIGH (ref 0.8–1.2)
Prothrombin Time: 24 s — ABNORMAL HIGH (ref 11.4–15.2)

## 2024-09-16 LAB — CBG MONITORING, ED
Glucose-Capillary: 178 mg/dL — ABNORMAL HIGH (ref 70–99)
Glucose-Capillary: 149 mg/dL — ABNORMAL HIGH (ref 70–99)
Glucose-Capillary: 137 mg/dL — ABNORMAL HIGH (ref 70–99)

## 2024-09-16 LAB — TROPONIN I (HIGH SENSITIVITY)
Troponin I (High Sensitivity): 53 ng/L — ABNORMAL HIGH (ref <18)
Troponin I (High Sensitivity): 40 ng/L — ABNORMAL HIGH (ref <18)

## 2024-09-16 MED ORDER — WARFARIN SODIUM 6 MG PO TABS
6.0000 mg | ORAL_TABLET | Freq: Once | ORAL | Status: AC
  Administered 2024-09-16: 6 mg via ORAL
  Filled 2024-09-16: qty 1

## 2024-09-16 MED ORDER — WARFARIN - PHARMACIST DOSING INPATIENT: Freq: Every day | Status: DC

## 2024-09-16 MED ORDER — ISOSORBIDE MONONITRATE ER 30 MG PO TB24
30.0000 mg | ORAL_TABLET | Freq: Every day | ORAL | Status: DC
  Administered 2024-09-16 – 2024-09-20 (×5): 30 mg via ORAL
  Filled 2024-09-16 (×5): qty 1

## 2024-09-16 MED ORDER — INFLUENZA VAC SPLIT HIGH-DOSE 0.5 ML IM SUSY
0.5000 mL | PREFILLED_SYRINGE | INTRAMUSCULAR | Status: AC
  Administered 2024-09-17: 0.5 mL via INTRAMUSCULAR
  Filled 2024-09-16: qty 0.5

## 2024-09-16 MED ORDER — CARVEDILOL 12.5 MG PO TABS
12.5000 mg | ORAL_TABLET | Freq: Two times a day (BID) | ORAL | Status: DC
Start: 2024-09-16 — End: 2024-09-17
  Administered 2024-09-16 (×2): 12.5 mg via ORAL
  Filled 2024-09-16 (×3): qty 1

## 2024-09-16 MED ORDER — ASPIRIN 81 MG PO TBEC
81.0000 mg | DELAYED_RELEASE_TABLET | Freq: Every day | ORAL | Status: DC
  Administered 2024-09-16 – 2024-09-20 (×5): 81 mg via ORAL
  Filled 2024-09-16 (×5): qty 1

## 2024-09-16 MED ORDER — IOHEXOL 350 MG/ML SOLN
75.0000 mL | Freq: Once | INTRAVENOUS | Status: AC | PRN
  Administered 2024-09-16: 75 mL via INTRAVENOUS

## 2024-09-16 MED ORDER — ACETAMINOPHEN 650 MG RE SUPP: 650.0000 mg | Freq: Four times a day (QID) | RECTAL | Status: DC | PRN

## 2024-09-16 MED ORDER — INSULIN ASPART 100 UNIT/ML IJ SOLN
0.0000 [IU] | Freq: Three times a day (TID) | INTRAMUSCULAR | Status: DC
  Administered 2024-09-16 – 2024-09-20 (×3): 1 [IU] via SUBCUTANEOUS

## 2024-09-16 MED ORDER — SACUBITRIL-VALSARTAN 24-26 MG PO TABS
1.0000 | ORAL_TABLET | Freq: Two times a day (BID) | ORAL | Status: DC
  Administered 2024-09-16 (×2): 1 via ORAL
  Filled 2024-09-16 (×3): qty 1

## 2024-09-16 MED ORDER — ACETAMINOPHEN 325 MG PO TABS: 650.0000 mg | ORAL_TABLET | Freq: Four times a day (QID) | ORAL | Status: DC | PRN

## 2024-09-16 MED ORDER — FUROSEMIDE 10 MG/ML IJ SOLN
40.0000 mg | Freq: Two times a day (BID) | INTRAMUSCULAR | Status: AC
  Administered 2024-09-16 (×2): 40 mg via INTRAVENOUS
  Filled 2024-09-16 (×2): qty 4

## 2024-09-16 MED ORDER — ATORVASTATIN CALCIUM 40 MG PO TABS
40.0000 mg | ORAL_TABLET | Freq: Every day | ORAL | Status: DC
  Administered 2024-09-16 – 2024-09-20 (×5): 40 mg via ORAL
  Filled 2024-09-16 (×5): qty 1

## 2024-09-16 MED ORDER — HYDRALAZINE HCL 25 MG PO TABS
25.0000 mg | ORAL_TABLET | Freq: Three times a day (TID) | ORAL | Status: DC
Start: 2024-09-16 — End: 2024-09-17
  Administered 2024-09-16 (×2): 25 mg via ORAL
  Filled 2024-09-16 (×4): qty 1

## 2024-09-16 NOTE — H&P (Signed)
 History and Physical    NICOLENA SCHURMAN FMW:[id number] DOB: February 17, 1956 DOA: 09/15/2024  Patient coming from: Home.  Chief Complaint: Shortness of breath.  HPI: Rachael Davis is a 68 y.o. female with history of chronic HFrEF last EF measured was in January 2025 20 to 25%, CAD status post CABG, bioprosthetic mitral valve replacement, ICD placement for history of VT's, paroxysmal atrial fibrillation, diabetes mellitus type 2, chronic kidney disease stage III presents to the ER with complaints of increasing shortness of breath with peripheral edema over the last 48 hours.  Patient states she missed her torsemide  last couple of days due to inconvenience from diuresis at workplace.  Denies any chest pain productive cough fever chills.  ED Course: In the ER on exam patient has had elevated JVD and also mild edema of the lower extremities with CT angiogram of the chest was negative for PE did show some features concerning for pulmonary edema.  He was given 40 mg IV Lasix  admitted for further management.  Labs show hemoglobin of 13 creatinine of 1.2 BNP 2800.  Review of Systems: As per HPI, rest all negative.   Past Medical History:  Diagnosis Date   Arrhythmia    Hx PAF, PVC's   CHF (congestive heart failure) (HCC)    Coronary artery disease    04/04/2017 STEMI   Diabetes [facility])    H/O mitral valve replacement 09/21/2021   Heart attack (HCC)    per pt reprot   Hyperlipidemia    Hypertension    New onset atrial fibrillation (HCC) 05/2017    Past Surgical History:  Procedure Laterality Date   CORONARY ARTERY BYPASS GRAFT N/A 04/12/2017   Procedure: CORONARY ARTERY BYPASS GRAFTING (CABG), ON PUMP, TIMES FOUR, USING LEFT INTERNAL MAMMARY ARTERY AND ENDOSCOPICALLY HARVESTED RIGHT GREATER SAPHENOUS VEIN;  Surgeon: Fleeta Hanford Coy, MD;  Location: [facility] OR;  Service: Open Heart Surgery;  Laterality: N/A;  LIMA to LAD; SVG to OM; SVG to Ramus; SVG to Right   ICD IMPLANT N/A 06/30/2022   Procedure: ICD  IMPLANT;  Surgeon: Fernande Elspeth JAYSON, MD;  Location: [facility] INVASIVE CV LAB;  Service: Cardiovascular;  Laterality: N/A;   IR THORACENTESIS ASP PLEURAL SPACE W/IMG GUIDE  05/25/2017   LEFT HEART CATH AND CORONARY ANGIOGRAPHY N/A 04/04/2017   Procedure: Left Heart Cath and Coronary Angiography;  Surgeon: Peter M [name], MD;  Location: [facility] INVASIVE CV LAB;  Service: Cardiovascular;  Laterality: N/A;   MITRAL VALVE REPAIR N/A 04/12/2017   Procedure: MITRAL VALVE REPLACEMENT (MVR);  Surgeon: Fleeta Hanford, Coy, MD;  Location: [facility] OR;  Service: Open Heart Surgery;  Laterality: N/A;  Using a 25mm Magna Ease Mitral Pericardial Bioprosthesis   RIGHT/LEFT HEART CATH AND CORONARY/GRAFT ANGIOGRAPHY N/A 04/20/2022   Procedure: RIGHT/LEFT HEART CATH AND CORONARY/GRAFT ANGIOGRAPHY;  Surgeon: Cherrie Toribio SAUNDERS, MD;  Location: [facility] INVASIVE CV LAB;  Service: Cardiovascular;  Laterality: N/A;   TEE WITHOUT CARDIOVERSION N/A 04/12/2017   Procedure: TRANSESOPHAGEAL ECHOCARDIOGRAM (TEE);  Surgeon: Fleeta Hanford, Coy, MD;  Location: [facility] OR;  Service: Open Heart Surgery;  Laterality: N/A;   TUBAL LIGATION       reports that she has never smoked. She has never used smokeless tobacco. She reports current drug use. Drug: Marijuana. She reports that she does not drink alcohol.  Allergies  Allergen Reactions   Keflex  [Cephalexin ] Rash    Skin on feet peeled.   Asa [Aspirin ]     325 mg ----heartburn Pt can take 81 mg  Enalapril Cough   Lisinopril  Cough    Family History  Problem Relation Age of Onset   Heart attack Father 76   Breast cancer Maternal Grandmother     Prior to Admission medications   Medication Sig Start Date End Date Taking? Authorizing Provider  acetaminophen  (TYLENOL ) 325 MG tablet Take 2 tablets (650 mg total) by mouth every 6 (six) hours as needed for mild pain or headache. 05/28/17  Yes Dwan Aldo M, PA-C  aspirin  EC 81 MG tablet Take 81 mg by mouth daily.   Yes [provider]   atorvastatin  (LIPITOR ) 40 MG tablet TAKE 1 TABLET BY MOUTH EVERY DAY 07/11/24  Yes Raford Riggs, MD  carvedilol  (COREG ) 12.5 MG tablet Take 1 tablet (12.5 mg total) by mouth 2 (two) times daily. 07/08/24 10/06/24 Yes Camnitz, Soyla Lunger, MD  dapagliflozin  propanediol (FARXIGA ) 10 MG TABS tablet Take 10 mg by mouth daily.   Yes [provider]  ENTRESTO  24-26 MG TAKE 1 TABLET BY MOUTH TWICE A DAY 05/23/24  Yes Bensimhon, Toribio SAUNDERS, MD  glipiZIDE  (GLUCOTROL  XL) 10 MG 24 hr tablet Take 1 tablet (10 mg total) by mouth daily with breakfast. 05/28/17  Yes Dwan Aldo M, PA-C  hydrALAZINE  (APRESOLINE ) 25 MG tablet Take 1 tablet (25 mg total) by mouth 3 (three) times daily. 07/09/24  Yes Hayes Beckey CROME, NP  isosorbide  mononitrate (IMDUR ) 30 MG 24 hr tablet Take 1 tablet (30 mg total) by mouth daily. 07/09/24  Yes Hayes Beckey CROME, NP  metFORMIN  (GLUCOPHAGE ) 1000 MG tablet Take 1,000 mg by mouth 2 (two) times daily with a meal.   Yes [provider]  RYBELSUS 14 MG TABS Take 1 tablet by mouth every morning.   Yes [provider]  torsemide  (DEMADEX ) 20 MG tablet PLEASE SEE ATTACHED FOR DETAILED DIRECTIONS Patient taking differently: Take 20-40 mg by mouth See admin instructions. Take 1 tablet by mouth Mon thru Fri, then take 2 tablets every Sat and Sun 03/18/24  Yes Bensimhon, Daniel R, MD  warfarin (COUMADIN ) 5 MG tablet TAKE 1 TABLET BY MOUTH DAILY OR AS DIRECTED BY COUMADIN  CLINIC Patient taking differently: Take 5 mg by mouth daily. TAKE 1 TABLET BY MOUTH DAILY OR AS DIRECTED BY COUMADIN  CLINIC 03/26/23  Yes Raford Riggs, MD  potassium chloride  SA (KLOR-CON  M) 20 MEQ tablet Take 2 tablets by mouth daily. Patient not taking: No sig reported 07/05/23   Glena Harlene HERO, FNP    Physical Exam: Constitutional: Moderately built and nourished. Vitals:   09/15/24 1437 09/15/24 2003 09/15/24 2342 09/16/24 0300  BP:  (!) 154/108 (!) 127/93 (!) 147/99  Pulse:  99 84 88  Resp:  20  16 20   Temp:  (!) 97.5 F (36.4 C) 98.5 F (36.9 C) 98.3 F (36.8 C)  SpO2:  100% 100% 100%  Weight: 82 kg     Height: 5' 2 (1.575 m)      Eyes: Anicteric no pallor. ENMT: No discharge from the ears eyes nose or mouth. Neck: JVD elevated no mass felt. Respiratory: No rhonchi or crepitations. Cardiovascular: S1-S2 heard. Abdomen: Soft nontender bowel sound present. Musculoskeletal: Mild edema of the both lower extremities. Skin: No rash. Neurologic: Alert awake oriented to time place and person.  Moves all extremities. Psychiatric: Appears normal.  Normal affect.   Labs on Admission: I have personally reviewed following labs and imaging studies  CBC: Recent Labs  Lab 09/15/24 1455  WBC 7.3  HGB 13.0  HCT 42.8  MCV  74.8*  PLT 144*   Basic Metabolic Panel: Recent Labs  Lab 09/15/24 1455  NA 138  K 4.3  CL 107  CO2 21*  GLUCOSE 127*  BUN 18  CREATININE 1.29*  CALCIUM  9.2  MG 2.1   GFR: Estimated Creatinine Clearance: 40.9 mL/min (A) (by C-G formula based on SCr of 1.29 mg/dL (H)). Liver Function Tests: No results for input(s): AST, ALT, ALKPHOS, BILITOT, PROT, ALBUMIN  in the last 168 hours. No results for input(s): LIPASE, AMYLASE in the last 168 hours. No results for input(s): AMMONIA in the last 168 hours. Coagulation Profile: No results for input(s): INR, PROTIME in the last 168 hours. Cardiac Enzymes: No results for input(s): CKTOTAL, CKMB, CKMBINDEX, TROPONINI in the last 168 hours. BNP (last 3 results) No results for input(s): PROBNP in the last 8760 hours. HbA1C: No results for input(s): HGBA1C in the last 72 hours. CBG: No results for input(s): GLUCAP in the last 168 hours. Lipid Profile: No results for input(s): CHOL, HDL, LDLCALC, TRIG, CHOLHDL, LDLDIRECT in the last 72 hours. Thyroid  Function Tests: No results for input(s): TSH, T4TOTAL, FREET4, T3FREE, THYROIDAB in the last 72  hours. Anemia Panel: No results for input(s): VITAMINB12, FOLATE, FERRITIN, TIBC, IRON, RETICCTPCT in the last 72 hours. Urine analysis:    Component Value Date/Time   COLORURINE YELLOW 04/15/2017 1229   APPEARANCEUR HAZY (A) 04/15/2017 1229   LABSPEC 1.010 04/15/2017 1229   PHURINE 5.0 04/15/2017 1229   GLUCOSEU NEGATIVE 04/15/2017 1229   HGBUR SMALL (A) 04/15/2017 1229   BILIRUBINUR NEGATIVE 04/15/2017 1229   KETONESUR NEGATIVE 04/15/2017 1229   PROTEINUR NEGATIVE 04/15/2017 1229   NITRITE NEGATIVE 04/15/2017 1229   LEUKOCYTESUR NEGATIVE 04/15/2017 1229   Sepsis Labs: @LABRCNTIP (procalcitonin:4,lacticidven:4) )No results found for this or any previous visit (from the past 240 hours).   Radiological Exams on Admission: CT Angio Chest PE W and/or Wo Contrast Result Date: 09/16/2024 EXAM: CTA of the Chest with contrast for PE 09/16/2024 01:19:47 AM TECHNIQUE: CTA of the chest was performed with the administration of 75 mL of iohexol  (OMNIPAQUE ) 350 MG/ML injection. Multiplanar reformatted images are provided for review. MIP images are provided for review. Automated exposure control, iterative reconstruction, and/or weight based adjustment of the mA/kV was utilized to reduce the radiation dose to as low as reasonably achievable. COMPARISON: Chest radiograph dated 09/15/2024. CLINICAL HISTORY: Signs of acute right heart strain, new SOB. Patient complains of shortness of breath that started yesterday. Patient denies pain, states her defibrillator is vibrating so she thinks it is going off. Patient has had nausea and vomiting. FINDINGS: PULMONARY ARTERIES: Pulmonary arteries are adequately opacified for evaluation. No pulmonary embolism. Main pulmonary artery is normal in caliber. MEDIASTINUM: Cardiomegaly. Moderate 3 vessel coronary atherosclerosis. Status post CABG. Mitral valve annular calcifications. Signs of acute right heart strain. There is no acute abnormality of the thoracic  aorta. LYMPH NODES: Small mediastinal nodes, likely reactive. No hilar or axillary lymphadenopathy. LUNGS AND PLEURA: Suspected faint perihilar edema. Linear scarring/atelectasis in the lingula, right middle lobe, and bilateral lower lobes. Trace left pleural effusion. No pneumothorax. UPPER ABDOMEN: Limited images of the upper abdomen are unremarkable. SOFT TISSUES AND BONES: Median sternotomy. Mild degenerative changes of the mid thoracic spine. No acute soft tissue abnormality. IMPRESSION: 1. No evidence of pulmonary embolism. 2. Cardiomegaly with suspected mild pulmonary edema and trace left pleural effusion. Electronically signed by: Pinkie Pebbles MD 09/16/2024 01:27 AM EDT RP Workstation: [id number]   DG Chest 1 View Result Date: 09/15/2024 CLINICAL  DATA:  Shortness of breath. EXAM: CHEST  1 VIEW COMPARISON:  12/17/2023. FINDINGS: Stable cardiomegaly. Status post median sternotomy, CABG, and mitral valve replacement. Left subclavian AICD. Central pulmonary vascular congestion with possible mild interstitial edema. No sizable pleural effusion. No pneumothorax. No acute osseous abnormality. IMPRESSION: Cardiomegaly with central pulmonary vascular congestion and possible mild interstitial edema. Electronically Signed   By: Harrietta Sherry M.D.   On: 09/15/2024 15:49    EKG: Independently reviewed.  Normal sinus rhythm RBBB nonspecific ST-T changes.  Assessment/Plan Principal Problem:   Acute on chronic HFrEF (heart failure with reduced ejection fraction) (HCC) Active Problems:   Essential hypertension   Diabetes mellitus with nephropathy (HCC)   S/P CABG x 4   PAF (paroxysmal atrial fibrillation) (HCC)   Long term (current) use of anticoagulants [Z79.01]   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    Acute on chronic HFrEF last EF measured was 20 to 25% on January 2025.  Patient had missed some of her torsemide  doses.  Patient received 40 mg IV Lasix  in the ER will  continue with 40 mg IV every 12 hourly for 2 more doses and following which we will plan further doses.  Closely follow intake output metabolic panel daily weights.  Continue Entresto  carvedilol . CAD status post CABG denies any chest pain.  Troponins are pending.  Continue with Coumadin  Lipitor  Imdur  and statins and aspirin . Diabetes mellitus type 2 takes Rybelsus at home with metformin .  Presently on sliding scale coverage.  Check hemoglobin A1c. Chronic kidney disease stage III creatinine at around baseline. Hypertension on Entresto  Imdur  carvedilol  hydralazine  and Lasix . Paroxysmal atrial fibrillation presently in sinus rhythm.  On Coumadin  for anticoagulation.  Rate controlled on carvedilol . History of bioprosthetic mitral valve replacement. History of ICD placement.  Since patient has acute CHF will need IV diuresis and more than 2 midnight stay.   DVT prophylaxis: Coumadin . Code Status: Full code. Family Communication: Discussed with patient. Disposition Plan: Cardiac telemetry. Consults called: None. Admission status: Observation.

## 2024-09-16 NOTE — Plan of Care (Signed)

## 2024-09-16 NOTE — ED Notes (Signed)
 Patient was alert but she was sleep when vital was taken .Tech ran to sets of vitals on patient  will run another set of vitals in 15 minutes.

## 2024-09-16 NOTE — ED Notes (Signed)
 Checked blood sugar it was 145 notified rn patient is sitting up eating breakfast with call bell in reach

## 2024-09-16 NOTE — Care Plan (Signed)
 This 68 yrs old female with PMH significant for chronic HFr EF with recent LVEF measured in January 25 (20 to 25%), CAD status post CABG, prosthetic mitral valve replacement, AICD placement for history of VT's, paroxysmal A-fib, diabetes mellitus type 2, CKD stage IIIa presented in the ED with complaints of increasing shortness of breath associated with peripheral edema for last 48 hours.  Patient states that she missed her torsemide  dose for last couple of days due to inconvenience from diuresis at workplace.  On exam she has elevated JVD and edema of lower extremities.  CTA chest ruled out pulmonary embolism but did show findings concerning for pulmonary edema.  Patient was admitted for further evaluation and started on IV Lasix  40 mg every 12 hours, will monitor daily weight, intake output charting.  Patient was seen and examined at bedside, already appeared improved and feeling better.

## 2024-09-16 NOTE — Progress Notes (Signed)
 Heart Failure Navigator Progress Note  Assessed for Heart & Vascular TOC clinic readiness.  Patient does not meet criteria due to is an Advanced Heart Failure Team patient of Dr. Bensimhon. .   Navigator will sign off at this time.   Stephane Haddock, BSN, Scientist, clinical (histocompatibility and immunogenetics) Only

## 2024-09-16 NOTE — Progress Notes (Signed)
 PHARMACY - ANTICOAGULATION CONSULT NOTE  Pharmacy Consult for warfarin Indication: atrial fibrillation  Allergies  Allergen Reactions   Keflex  [Cephalexin ] Rash    Skin on feet peeled.   Asa [Aspirin ]     325 mg ----heartburn Pt can take 81 mg   Enalapril Cough   Lisinopril  Cough    Patient Measurements: Height: 5' 2 (157.5 cm) Weight: 82 kg (180 lb 12.4 oz) IBW/kg (Calculated) : 50.1 HEPARIN  DW (KG): 68.4  Vital Signs: Temp: 98.3 F (36.8 C) (10/14 0300) BP: 130/81 (10/14 0628) Pulse Rate: 98 (10/14 0628)  Labs: Recent Labs    09/15/24 1455  HGB 13.0  HCT 42.8  PLT 144*  CREATININE 1.29*    Estimated Creatinine Clearance: 40.9 mL/min (A) (by C-G formula based on SCr of 1.29 mg/dL (H)).   Medical History: Past Medical History:  Diagnosis Date   Arrhythmia    Hx PAF, PVC's   CHF (congestive heart failure) (HCC)    Coronary artery disease    04/04/2017 STEMI   Diabetes [facility])    H/O mitral valve replacement 09/21/2021   Heart attack (HCC)    per pt reprot   Hyperlipidemia    Hypertension    New onset atrial fibrillation (HCC) 05/2017    Assessment: 68yo female c/o SOB, admitted w/ acute on chronic HFrEF, to continue warfarin for PAF (also has bioMVR and h/o VTE).  Goal of Therapy:  INR 2-3   Plan:  Monitor INR for dose adjustments.  Marvetta Dauphin, PharmD, BCPS  09/16/2024,6:29 AM

## 2024-09-16 NOTE — ED Notes (Signed)
 Assuming pt care, pt walk in for sob x 3 days, pt reports med hx. HTN, DM, CHF, CHL. Pt denies pain/complaints at this time. Call bell within reach

## 2024-09-16 NOTE — Progress Notes (Signed)
 PHARMACY - ANTICOAGULATION CONSULT NOTE  Pharmacy Consult for warfarin Indication: atrial fibrillation  Allergies  Allergen Reactions   Keflex  [Cephalexin ] Rash    Skin on feet peeled.   Asa [Aspirin ]     325 mg ----heartburn Pt can take 81 mg   Enalapril Cough   Lisinopril  Cough    Patient Measurements: Height: 5' 2 (157.5 cm) Weight: 82 kg (180 lb 12.4 oz) IBW/kg (Calculated) : 50.1 HEPARIN  DW (KG): 68.4  Vital Signs: Temp: 98.6 F (37 C) (10/14 0743) Temp Source: Oral (10/14 0743) BP: 138/99 (10/14 0743) Pulse Rate: 96 (10/14 0743)  Labs: Recent Labs    09/15/24 1455 09/16/24 0650  HGB 13.0 13.3  HCT 42.8 44.0  PLT 144* 155  LABPROT  --  24.0*  INR  --  2.0*  CREATININE 1.29*  --     Estimated Creatinine Clearance: 40.9 mL/min (A) (by C-G formula based on SCr of 1.29 mg/dL (H)).   Medical History: Past Medical History:  Diagnosis Date   Arrhythmia    Hx PAF, PVC's   CHF (congestive heart failure) (HCC)    Coronary artery disease    04/04/2017 STEMI   Diabetes [facility])    H/O mitral valve replacement 09/21/2021   Heart attack (HCC)    per pt reprot   Hyperlipidemia    Hypertension    New onset atrial fibrillation (HCC) 05/2017    Assessment: 68yo female c/o SOB, admitted w/ acute on chronic HFrEF, to continue warfarin for PAF (also has bioMVR and h/o VTE).  Patient taking 5mg  daily, last dose of warfarin Sunday morning. INR this AM 2.0. HgB 13.3 and PLTs 155.   Goal of Therapy:  INR 2-3   Plan:  Warfarin 6mg  x1. Higher than home dose x1 day given at lower end therapeutic and patient missed a dose on 10/13, expect INR to downtrend due to missed dose.  Daily INR.  Monitor CBC and for s/sx of bleeding.  Powell Blush, PharmD, BCCCP  09/16/2024,8:04 AM

## 2024-09-17 ENCOUNTER — Other Ambulatory Visit (HOSPITAL_COMMUNITY): Payer: Self-pay

## 2024-09-17 ENCOUNTER — Inpatient Hospital Stay (HOSPITAL_COMMUNITY)

## 2024-09-17 DIAGNOSIS — R0609 Other forms of dyspnea: Secondary | ICD-10-CM

## 2024-09-17 DIAGNOSIS — E1121 Type 2 diabetes mellitus with diabetic nephropathy: Secondary | ICD-10-CM | POA: Diagnosis not present

## 2024-09-17 DIAGNOSIS — Z951 Presence of aortocoronary bypass graft: Secondary | ICD-10-CM | POA: Diagnosis not present

## 2024-09-17 DIAGNOSIS — I10 Essential (primary) hypertension: Secondary | ICD-10-CM | POA: Diagnosis not present

## 2024-09-17 DIAGNOSIS — I5023 Acute on chronic systolic (congestive) heart failure: Secondary | ICD-10-CM | POA: Diagnosis not present

## 2024-09-17 DIAGNOSIS — I480 Paroxysmal atrial fibrillation: Secondary | ICD-10-CM

## 2024-09-17 LAB — PROTIME-INR
INR: 2.3 — ABNORMAL HIGH (ref 0.8–1.2)
Prothrombin Time: 26.7 s — ABNORMAL HIGH (ref 11.4–15.2)

## 2024-09-17 LAB — BASIC METABOLIC PANEL WITH GFR
Anion gap: 13 (ref 5–15)
BUN: 35 mg/dL — ABNORMAL HIGH (ref 8–23)
CO2: 23 mmol/L (ref 22–32)
Calcium: 8.5 mg/dL — ABNORMAL LOW (ref 8.9–10.3)
Chloride: 101 mmol/L (ref 98–111)
Creatinine, Ser: 2.14 mg/dL — ABNORMAL HIGH (ref 0.44–1.00)
GFR, Estimated: 24 mL/min — ABNORMAL LOW (ref >60)
Glucose, Bld: 151 mg/dL — ABNORMAL HIGH (ref 70–99)
Potassium: 3.7 mmol/L (ref 3.5–5.1)
Sodium: 137 mmol/L (ref 135–145)

## 2024-09-17 LAB — ECHOCARDIOGRAM COMPLETE
Calc EF: 23.5 %
Est EF: <20
Height: 62 in
MV VTI: 1.27 cm2
S' Lateral: 4.7 cm
Single Plane A2C EF: 20.5 %
Single Plane A4C EF: 27.8 %
Weight: 2697.6 [oz_av]

## 2024-09-17 LAB — GLUCOSE, CAPILLARY
Glucose-Capillary: 106 mg/dL — ABNORMAL HIGH (ref 70–99)
Glucose-Capillary: 104 mg/dL — ABNORMAL HIGH (ref 70–99)
Glucose-Capillary: 110 mg/dL — ABNORMAL HIGH (ref 70–99)
Glucose-Capillary: 140 mg/dL — ABNORMAL HIGH (ref 70–99)

## 2024-09-17 MED ORDER — CARVEDILOL 12.5 MG PO TABS
12.5000 mg | ORAL_TABLET | Freq: Two times a day (BID) | ORAL | Status: DC
  Administered 2024-09-17: 12.5 mg via ORAL
  Filled 2024-09-17: qty 1

## 2024-09-17 MED ORDER — PERFLUTREN LIPID MICROSPHERE
1.0000 mL | INTRAVENOUS | Status: AC | PRN
  Administered 2024-09-17: 4 mL via INTRAVENOUS

## 2024-09-17 MED ORDER — HYDRALAZINE HCL 25 MG PO TABS
25.0000 mg | ORAL_TABLET | Freq: Two times a day (BID) | ORAL | Status: DC
  Administered 2024-09-18 – 2024-09-19 (×3): 25 mg via ORAL
  Filled 2024-09-17 (×3): qty 1

## 2024-09-17 MED ORDER — WARFARIN SODIUM 5 MG PO TABS
5.0000 mg | ORAL_TABLET | Freq: Once | ORAL | Status: AC
  Administered 2024-09-17: 5 mg via ORAL
  Filled 2024-09-17: qty 1

## 2024-09-17 MED ORDER — FUROSEMIDE 10 MG/ML IJ SOLN
60.0000 mg | Freq: Two times a day (BID) | INTRAMUSCULAR | Status: DC
  Administered 2024-09-17 – 2024-09-18 (×2): 60 mg via INTRAVENOUS
  Filled 2024-09-17 (×2): qty 6

## 2024-09-17 NOTE — Assessment & Plan Note (Addendum)
 Resume entresto  and carvedilol .  Hold on hydralazine  to avoid hypotension.

## 2024-09-17 NOTE — Assessment & Plan Note (Addendum)
 Patient was placed on insulin  sliding scale for glucose cover and monitoring At discharge will resume oral hypoglycemic therapy with glipizide .  Resume metformin  and semaglutide.

## 2024-09-17 NOTE — Hospital Course (Addendum)
 Rachael Davis was admitted to the hospital with the working diagnosis of heart failure exacerbation  68 yo female with the past medical history of heart failure, coronary artery disease sp CABG, mitral valve replacement, paroxysmal atrial fibrillation, T2DM and CKD who presented with dyspnea.  Reported 48 hrs of worsening dyspnea and lower extremity edema. Noted vibrating defibrillator, not sure if going off.  Apparently she has not been compliant with her diuretic regimen as outpatient due to interfering with her work.  On her initial physical examination her blood pressure was 154/108, HR 99, RR 20 and 02 saturation 100% Lungs with no wheezing or rhonchi, heart with S1 and S2 present and regular, positive JVD, abdomen with no distention and positive lower extremity edema.   Na 138. K 4.3 Cl 107 bicarbonate 21 glucose 127 bun 18 cr 1.29  Mg 2.1  BNP 2,832  Wbc 7.3 hgb 13.0 plt 144  EKG 92 bpm, normal axis, right bundle branch block, qtc 554, atrial fibrillation with no significant ST segment or T wave changes.   CT chest no evidence of pulmonary embolism. Bilateral ground glass opacities.trace left pleural effusion.   Chest radiograph with cardiomegaly, bilateral hilar vascular congestion, pacemaker defibrillator in place with one right atrial lead and one ventricular lead.   Patient was placed on IV furosemide  for diuresis  Echocardiogram with reduced LV systolic function less than 20%  10/16 improving volume status,  10/18 resumed guideline directed medical therapy with good toleration Plan to follow up as outpatient.

## 2024-09-17 NOTE — Assessment & Plan Note (Addendum)
 Telemetry with sinus rhythm with 1st degree AV bock, intermittent positive ventricular paced rhythm  Rate 50 to 60 bpm.   Continue rate control with carvedilol  Anticoagulation with warfarin  Her discharge INR is 2,3

## 2024-09-17 NOTE — Progress Notes (Signed)
 Echocardiogram 2D Echocardiogram has been performed.  Juliene JINNY Rucks 09/17/2024, 3:22 PM

## 2024-09-17 NOTE — Assessment & Plan Note (Signed)
 No acute coronary syndrome.

## 2024-09-17 NOTE — Assessment & Plan Note (Addendum)
 Echocardiogram with reduced LV systolic function less than 20%, global hypokinesis, RV systolic function preserved, LA with mild dilatation, mitral valve has been replaced, (bioprosthetic),   Urine output is 2,600 ml Systolic blood pressure 120 mmHg range   Patient had one dose of furosemide  to 80 mg IV this morning, will transition to po loop diuretic in am.  Limited medical therapy due to risk of hypotension and acutely reduced GFR.   Plan to resume entresto , and carvedilol  Continue isosorbide , will hold on hydralazine  for now

## 2024-09-17 NOTE — Plan of Care (Signed)
   Problem: Coping: Goal: Ability to adjust to condition or change in health will improve Outcome: Progressing   Problem: Skin Integrity: Goal: Risk for impaired skin integrity will decrease Outcome: Progressing   Problem: Education: Goal: Knowledge of General Education information will improve Description: Including pain rating scale, medication(s)/side effects and non-pharmacologic comfort measures Outcome: Progressing

## 2024-09-17 NOTE — Plan of Care (Signed)
 Problem: Education: Goal: Ability to describe self-care measures that may prevent or decrease complications (Diabetes Survival Skills Education) will improve 09/17/2024 0750 by Norine Colman BIRCH, RN Outcome: Progressing 09/16/2024 1756 by Norine Colman BIRCH, RN Outcome: Progressing Goal: Individualized Educational Video(s) 09/17/2024 0750 by Norine Colman BIRCH, RN Outcome: Progressing 09/16/2024 1756 by Norine Colman BIRCH, RN Outcome: Progressing   Problem: Coping: Goal: Ability to adjust to condition or change in health will improve 09/17/2024 0750 by Norine Colman BIRCH, RN Outcome: Progressing 09/16/2024 1756 by Norine Colman BIRCH, RN Outcome: Progressing   Problem: Fluid Volume: Goal: Ability to maintain a balanced intake and output will improve 09/17/2024 0750 by Norine Colman BIRCH, RN Outcome: Progressing 09/16/2024 1756 by Norine Colman BIRCH, RN Outcome: Progressing   Problem: Health Behavior/Discharge Planning: Goal: Ability to identify and utilize available resources and services will improve 09/17/2024 0750 by Norine Colman BIRCH, RN Outcome: Progressing 09/16/2024 1756 by Norine Colman BIRCH, RN Outcome: Progressing Goal: Ability to manage health-related needs will improve 09/17/2024 0750 by Norine Colman BIRCH, RN Outcome: Progressing 09/16/2024 1756 by Norine Colman D, RN Outcome: Progressing   Problem: Metabolic: Goal: Ability to maintain appropriate glucose levels will improve 09/17/2024 0750 by Norine Colman BIRCH, RN Outcome: Progressing 09/16/2024 1756 by Norine Colman D, RN Outcome: Progressing   Problem: Nutritional: Goal: Maintenance of adequate nutrition will improve 09/17/2024 0750 by Norine Colman BIRCH, RN Outcome: Progressing 09/16/2024 1756 by Norine Colman D, RN Outcome: Progressing Goal: Progress toward achieving an optimal weight will improve 09/17/2024 0750 by Norine Colman BIRCH, RN Outcome: Progressing 09/16/2024 1756 by Norine Colman D, RN Outcome: Progressing   Problem: Skin Integrity: Goal: Risk for impaired skin integrity will decrease 09/17/2024 0750 by Norine Colman BIRCH, RN Outcome: Progressing 09/16/2024 1756 by Norine Colman D, RN Outcome: Progressing   Problem: Tissue Perfusion: Goal: Adequacy of tissue perfusion will improve 09/17/2024 0750 by Norine Colman BIRCH, RN Outcome: Progressing 09/16/2024 1756 by Norine Colman BIRCH, RN Outcome: Progressing   Problem: Education: Goal: Knowledge of General Education information will improve Description: Including pain rating scale, medication(s)/side effects and non-pharmacologic comfort measures 09/17/2024 0750 by Norine Colman BIRCH, RN Outcome: Progressing 09/16/2024 1756 by Norine Colman BIRCH, RN Outcome: Progressing   Problem: Health Behavior/Discharge Planning: Goal: Ability to manage health-related needs will improve 09/17/2024 0750 by Norine Colman BIRCH, RN Outcome: Progressing 09/16/2024 1756 by Norine Colman D, RN Outcome: Progressing   Problem: Clinical Measurements: Goal: Ability to maintain clinical measurements within normal limits will improve 09/17/2024 0750 by Norine Colman BIRCH, RN Outcome: Progressing 09/16/2024 1756 by Norine Colman D, RN Outcome: Progressing Goal: Will remain free from infection 09/17/2024 0750 by Norine Colman BIRCH, RN Outcome: Progressing 09/16/2024 1756 by Norine Colman D, RN Outcome: Progressing Goal: Diagnostic test results will improve 09/17/2024 0750 by Norine Colman BIRCH, RN Outcome: Progressing 09/16/2024 1756 by Norine Colman D, RN Outcome: Progressing Goal: Respiratory complications will improve 09/17/2024 0750 by Norine Colman BIRCH, RN Outcome: Progressing 09/16/2024 1756 by Norine Colman D, RN Outcome: Progressing Goal: Cardiovascular complication will be  avoided 09/17/2024 0750 by Norine Colman BIRCH, RN Outcome: Progressing 09/16/2024 1756 by Norine Colman BIRCH, RN Outcome: Progressing   Problem: Activity: Goal: Risk for activity intolerance will decrease 09/17/2024 0750 by Norine Colman BIRCH, RN Outcome: Progressing 09/16/2024 1756 by Norine Colman D, RN Outcome: Progressing   Problem: Nutrition: Goal: Adequate nutrition will be maintained 09/17/2024 0750 by Norine Colman BIRCH, RN Outcome: Progressing 09/16/2024 1756 by Norine Colman BIRCH, RN Outcome: Progressing  Problem: Coping: Goal: Level of anxiety will decrease 09/17/2024 0750 by Norine Colman BIRCH, RN Outcome: Progressing 09/16/2024 1756 by Norine Colman D, RN Outcome: Progressing   Problem: Elimination: Goal: Will not experience complications related to bowel motility 09/17/2024 0750 by Norine Colman BIRCH, RN Outcome: Progressing 09/16/2024 1756 by Norine Colman D, RN Outcome: Progressing Goal: Will not experience complications related to urinary retention 09/17/2024 0750 by Norine Colman BIRCH, RN Outcome: Progressing 09/16/2024 1756 by Norine Colman D, RN Outcome: Progressing   Problem: Pain Managment: Goal: General experience of comfort will improve and/or be controlled 09/17/2024 0750 by Norine Colman BIRCH, RN Outcome: Progressing 09/16/2024 1756 by Norine Colman D, RN Outcome: Progressing   Problem: Safety: Goal: Ability to remain free from injury will improve 09/17/2024 0750 by Norine Colman BIRCH, RN Outcome: Progressing 09/16/2024 1756 by Norine Colman D, RN Outcome: Progressing   Problem: Skin Integrity: Goal: Risk for impaired skin integrity will decrease 09/17/2024 0750 by Norine Colman BIRCH, RN Outcome: Progressing 09/16/2024 1756 by Norine Colman BIRCH, RN Outcome: Progressing

## 2024-09-17 NOTE — Progress Notes (Signed)
  Progress Note   Patient: Rachael Davis FMW:[id number] DOB: 1956/11/05 DOA: 09/15/2024     1 DOS: the patient was seen and examined on 09/17/2024   Brief hospital course: [name] was admitted to the hospital with the working diagnosis of heart failure exacerbation  68 yo female with the past medical history of heart failure, coronary artery disease sp CABG, mitral valve replacement, paroxysmal atrial fibrillation, T2DM and CKD who presented with dyspnea.  Reported 48 hrs of worsening dyspnea and lower extremity edema. Noted vibrating defibrillator, not sure if going off.  On her initial physical examination her blood pressure was 154/108, HR 99, RR 20 and 02 saturation 100% Lungs with no wheezing or rhonchi, heart with S1 and S2 present and regular, positive JVD, abdomen with no distention and positive lower extremity edema.    Assessment and Plan: * Acute on chronic systolic CHF (congestive heart failure) (HCC) Echocardiogram with reduced LV systolic function less than 20%, global hypokinesis, RV systolic function preserved, LA with mild dilatation, mitral valve has been replaced, (bioprosthetic),   Continue volume overloaded.  Systolic blood pressure 100 mmHg range.   Plan to increase furosemide  to 60 mg IV bid Limited medical therapy due to risk of hypotension and acutely reduced GFR.  Afterload reduction with isosorbide  and hydralazine    Hold on carvedilol .  Check lactic acid in am,   Essential hypertension Hold on entresto  due to risk of hypotension.  After load reduction with isosorbide  and hydralazine    Diabetes mellitus with nephropathy (HCC) Continue glucose cover and monitoring with insulin  sliding scale.   S/P CABG x 4 No acute coronary syndrome   PAF (paroxysmal atrial fibrillation) (HCC) Continue telemetry monitoring Hold on carvedilol  due to reduced LV systolic function.  Anticoagulation with warfarin         Subjective: Patient is feeling well,  but continue to have dyspnea and edema, not yet back to baseline   Physical Exam: Vitals:   09/16/24 2151 09/17/24 0008 09/17/24 0428 09/17/24 0800  BP: 101/67 (!) 100/57 (!) 90/50 (!) 104/58  Pulse:  78 73   Resp:  18 17   Temp:  (!) 97.5 F (36.4 C) (!) 97.5 F (36.4 C) 98.3 F (36.8 C)  TempSrc:  Oral Oral Oral  SpO2:  100% 100% 100%  Weight:   76.5 kg   Height:       Neurology awake and alert ENT with mild pallor Cardiovascular with S1 and S2 present and regular, positive systolic murmur at the left lower sternal border Mild JVD Respiratory with rales bilaterally with no wheezing or rhonchi Abdomen with no distention  Positive lower extremity edema +  Data Reviewed:    Family Communication: I spoke with patient's husband at the bedside, we talked in detail about patient's condition, plan of care and prognosis and all questions were addressed.   Disposition: Status is: Inpatient Remains inpatient appropriate because: IV diuresis   Planned Discharge Destination: Home    Author: Elidia Toribio Furnace, MD 09/17/2024 1:16 PM  For on call review [email or web address].

## 2024-09-17 NOTE — Progress Notes (Signed)
 PHARMACY - ANTICOAGULATION CONSULT NOTE  Pharmacy Consult for warfarin Indication: atrial fibrillation  Allergies  Allergen Reactions   Keflex  [Cephalexin ] Rash    Skin on feet peeled.   Asa [Aspirin ]     325 mg ----heartburn Pt can take 81 mg   Enalapril Cough   Lisinopril  Cough    Patient Measurements: Height: 5' 2 (157.5 cm) Weight: 76.5 kg (168 lb 9.6 oz) IBW/kg (Calculated) : 50.1 HEPARIN  DW (KG): 68.4  Vital Signs: Temp: 97.5 F (36.4 C) (10/15 0428) Temp Source: Oral (10/15 0428) BP: 90/50 (10/15 0428) Pulse Rate: 73 (10/15 0428)  Labs: Recent Labs    09/15/24 1455 09/16/24 0650 09/16/24 0830 09/17/24 0232  HGB 13.0 13.3  --   --   HCT 42.8 44.0  --   --   PLT 144* 155  --   --   LABPROT  --  24.0*  --  26.7*  INR  --  2.0*  --  2.3*  CREATININE 1.29* 1.39*  --  2.14*  TROPONINIHS  --  53* 40*  --     Estimated Creatinine Clearance: 23.8 mL/min (A) (by C-G formula based on SCr of 2.14 mg/dL (H)).   Medical History: Past Medical History:  Diagnosis Date   Arrhythmia    Hx PAF, PVC's   CHF (congestive heart failure) (HCC)    Coronary artery disease    04/04/2017 STEMI   Diabetes [facility])    H/O mitral valve replacement 09/21/2021   Heart attack (HCC)    per pt reprot   Hyperlipidemia    Hypertension    New onset atrial fibrillation (HCC) 05/2017    Assessment: 68yo female c/o SOB, admitted w/ acute on chronic HFrEF, to continue warfarin for PAF (also has bioMVR and h/o VTE).  Patient taking 5mg  daily, last dose of warfarin Sunday morning. -INR= 2.3  Goal of Therapy:  INR 2-3   Plan:  Warfarin 5mg  x1.  Daily INR.   Prentice Poisson, PharmD Clinical Pharmacist **Pharmacist phone directory can now be found on [email or web address] ([email or web address]).  Listed under [facility].

## 2024-09-17 NOTE — Plan of Care (Signed)
  Problem: Education: Goal: Ability to describe self-care measures that may prevent or decrease complications (Diabetes Survival Skills Education) will improve Outcome: Progressing   Problem: Coping: Goal: Ability to adjust to condition or change in health will improve Outcome: Progressing   Problem: Education: Goal: Knowledge of General Education information will improve Description: Including pain rating scale, medication(s)/side effects and non-pharmacologic comfort measures Outcome: Progressing   Problem: Clinical Measurements: Goal: Respiratory complications will improve Outcome: Progressing   Problem: Activity: Goal: Risk for activity intolerance will decrease Outcome: Progressing   Problem: Coping: Goal: Level of anxiety will decrease Outcome: Progressing   Problem: Safety: Goal: Ability to remain free from injury will improve Outcome: Progressing

## 2024-09-18 DIAGNOSIS — E1121 Type 2 diabetes mellitus with diabetic nephropathy: Secondary | ICD-10-CM | POA: Diagnosis not present

## 2024-09-18 DIAGNOSIS — I10 Essential (primary) hypertension: Secondary | ICD-10-CM | POA: Diagnosis not present

## 2024-09-18 DIAGNOSIS — Z951 Presence of aortocoronary bypass graft: Secondary | ICD-10-CM | POA: Diagnosis not present

## 2024-09-18 DIAGNOSIS — I5023 Acute on chronic systolic (congestive) heart failure: Secondary | ICD-10-CM | POA: Diagnosis not present

## 2024-09-18 LAB — BASIC METABOLIC PANEL WITH GFR
Anion gap: 13 (ref 5–15)
BUN: 36 mg/dL — ABNORMAL HIGH (ref 8–23)
CO2: 22 mmol/L (ref 22–32)
Calcium: 8.3 mg/dL — ABNORMAL LOW (ref 8.9–10.3)
Chloride: 102 mmol/L (ref 98–111)
Creatinine, Ser: 1.49 mg/dL — ABNORMAL HIGH (ref 0.44–1.00)
GFR, Estimated: 38 mL/min — ABNORMAL LOW (ref >60)
Glucose, Bld: 129 mg/dL — ABNORMAL HIGH (ref 70–99)
Potassium: 3.6 mmol/L (ref 3.5–5.1)
Sodium: 137 mmol/L (ref 135–145)

## 2024-09-18 LAB — GLUCOSE, CAPILLARY
Glucose-Capillary: 99 mg/dL (ref 70–99)
Glucose-Capillary: 132 mg/dL — ABNORMAL HIGH (ref 70–99)
Glucose-Capillary: 115 mg/dL — ABNORMAL HIGH (ref 70–99)
Glucose-Capillary: 186 mg/dL — ABNORMAL HIGH (ref 70–99)

## 2024-09-18 LAB — LACTIC ACID, PLASMA: Lactic Acid, Venous: 1.3 mmol/L (ref 0.5–1.9)

## 2024-09-18 LAB — MAGNESIUM: Magnesium: 2.1 mg/dL (ref 1.7–2.4)

## 2024-09-18 LAB — PROTIME-INR
INR: 2.4 — ABNORMAL HIGH (ref 0.8–1.2)
Prothrombin Time: 26.9 s — ABNORMAL HIGH (ref 11.4–15.2)

## 2024-09-18 MED ORDER — FUROSEMIDE 10 MG/ML IJ SOLN
80.0000 mg | Freq: Two times a day (BID) | INTRAMUSCULAR | Status: DC
  Administered 2024-09-18 – 2024-09-19 (×2): 80 mg via INTRAVENOUS
  Filled 2024-09-18 (×2): qty 8

## 2024-09-18 MED ORDER — WARFARIN SODIUM 5 MG PO TABS
5.0000 mg | ORAL_TABLET | Freq: Every day | ORAL | Status: DC
  Administered 2024-09-18 – 2024-09-19 (×2): 5 mg via ORAL
  Filled 2024-09-18 (×2): qty 1

## 2024-09-18 NOTE — TOC CM/SW Note (Addendum)
 Transition of Care [facility]) - Inpatient Brief Assessment   Patient Details  Name: Rachael Davis MRN: [id number] Date of Birth: 1956/07/21  Transition of [facility]) CM/SW Contact:    Waddell Barnie Rama, RN Phone Number: 09/18/2024, 9:52 AM   Clinical Narrative: From home with spouse, has PCP and insurance on file, states has no HH services in place at this time or DME at home.  States family member will transport them home at [employer] and family is support system, states gets medications from [facility] on [address]  Pta self ambulatory.   There are no ICM needs identified  at this time.  Please place consult for ICM needs.  She still works, not homebound.     Transition of Care Asessment: Insurance and Status: Insurance coverage has been reviewed Patient has primary care physician: Yes Home environment has been reviewed: home with spouse Prior level of function:: indep Prior/Current Home Services: No current home services Social Drivers of Health Review: SDOH reviewed no interventions necessary Readmission risk has been reviewed: Yes Transition of care needs: no transition of care needs at this time

## 2024-09-18 NOTE — Progress Notes (Signed)
 Mobility Specialist Progress Note:    09/18/24 1320  Mobility  Activity Ambulated with assistance  Level of Assistance Standby assist, set-up cues, supervision of patient - no hands on  Assistive Device None  Distance Ambulated (ft) 200 ft  Activity Response Tolerated well  Mobility Referral Yes  Mobility visit 1 Mobility  Mobility Specialist Start Time (ACUTE ONLY) 1320  Mobility Specialist Stop Time (ACUTE ONLY) 1325  Mobility Specialist Time Calculation (min) (ACUTE ONLY) 5 min   Received pt on EOB agreeable to session. No c/o any symptoms. Pt able to move and ambulate well. Returned pt to bed w/ all needs met.   Venetia Keel Mobility Specialist Please Neurosurgeon or Rehab Office at [phone]

## 2024-09-18 NOTE — Progress Notes (Signed)
 PHARMACY - ANTICOAGULATION CONSULT NOTE  Pharmacy Consult for warfarin Indication: atrial fibrillation  Allergies  Allergen Reactions   Keflex  [Cephalexin ] Rash    Skin on feet peeled.   Asa [Aspirin ]     325 mg ----heartburn Pt can take 81 mg   Enalapril Cough   Lisinopril  Cough    Patient Measurements: Height: 5' 2 (157.5 cm) Weight: 76 kg (167 lb 8 oz) IBW/kg (Calculated) : 50.1 HEPARIN  DW (KG): 68.4  Vital Signs: Temp: 97.7 F (36.5 C) (10/16 0410) Temp Source: Oral (10/16 0410) BP: 100/60 (10/16 0410) Pulse Rate: 65 (10/16 0410)  Labs: Recent Labs    09/15/24 1455 09/16/24 0650 09/16/24 0830 09/17/24 0232 09/18/24 0259  HGB 13.0 13.3  --   --   --   HCT 42.8 44.0  --   --   --   PLT 144* 155  --   --   --   LABPROT  --  24.0*  --  26.7* 26.9*  INR  --  2.0*  --  2.3* 2.4*  CREATININE 1.29* 1.39*  --  2.14* 1.49*  TROPONINIHS  --  53* 40*  --   --     Estimated Creatinine Clearance: 34 mL/min (A) (by C-G formula based on SCr of 1.49 mg/dL (H)).   Medical History: Past Medical History:  Diagnosis Date   Arrhythmia    Hx PAF, PVC's   CHF (congestive heart failure) (HCC)    Coronary artery disease    04/04/2017 STEMI   Diabetes [facility])    H/O mitral valve replacement 09/21/2021   Heart attack (HCC)    per pt reprot   Hyperlipidemia    Hypertension    New onset atrial fibrillation (HCC) 05/2017    Assessment: 68yo female c/o SOB, admitted w/ acute on chronic HFrEF, to continue warfarin for PAF (also has bioMVR and h/o VTE).  Patient taking 5mg  daily, last dose of warfarin Sunday morning. -INR= 2.4  Goal of Therapy:  INR 2-3   Plan:  Warfarin 5mg  daily Daily INR.   Prentice Poisson, PharmD Clinical Pharmacist **Pharmacist phone directory can now be found on [email or web address] ([email or web address]).  Listed under [facility].

## 2024-09-18 NOTE — Progress Notes (Signed)
 Progress Note   Patient: Rachael Davis FMW:[id number] DOB: December 24, 1955 DOA: 09/15/2024     2 DOS: the patient was seen and examined on 09/18/2024   Brief hospital course: [name] was admitted to the hospital with the working diagnosis of heart failure exacerbation  68 yo female with the past medical history of heart failure, coronary artery disease sp CABG, mitral valve replacement, paroxysmal atrial fibrillation, T2DM and CKD who presented with dyspnea.  Reported 48 hrs of worsening dyspnea and lower extremity edema. Noted vibrating defibrillator, not sure if going off.  On her initial physical examination her blood pressure was 154/108, HR 99, RR 20 and 02 saturation 100% Lungs with no wheezing or rhonchi, heart with S1 and S2 present and regular, positive JVD, abdomen with no distention and positive lower extremity edema.   Na 138. K 4.3 Cl 107 bicarbonate 21 glucose 127 bun 18 cr 1.29  Mg 2.1  BNP 2,832  Wbc 7.3 hgb 13.0 plt 144  EKG 92 bpm, normal axis, right bundle branch block, qtc 554, atrial fibrillation with no significant ST segment or T wave changes.   CT chest no evidence of pulmonary embolism. Bilateral ground glass opacities.trace left pleural effusion.   Chest radiograph with cardiomegaly, bilateral hilar vascular congestion, pacemaker defibrillator in place with one right atrial lead and one ventricular lead.   Patient was placed on IV furosemide  for diuresis  Echocardiogram with reduced LV systolic function less than 20%     Assessment and Plan: * Acute on chronic systolic CHF (congestive heart failure) (HCC) Echocardiogram with reduced LV systolic function less than 20%, global hypokinesis, RV systolic function preserved, LA with mild dilatation, mitral valve has been replaced, (bioprosthetic),   Urine output is 1900 ml Systolic blood pressure 100 mmHg range  Lactic acid 1.3   Plan to increase furosemide  to 80 mg IV bid Limited medical therapy due to  risk of hypotension and acutely reduced GFR.  Afterload reduction with isosorbide  and hydralazine    Hold on carvedilol .  Essential hypertension Hold on entresto  due to risk of hypotension.  After load reduction with isosorbide  and hydralazine    Diabetes mellitus with nephropathy (HCC) Continue glucose cover and monitoring with insulin  sliding scale.   S/P CABG x 4 No acute coronary syndrome   PAF (paroxysmal atrial fibrillation) (HCC) Continue telemetry monitoring Hold on carvedilol  due to reduced LV systolic function.  Anticoagulation with warfarin         Subjective: Patient having pain bilateral ankles not able to ambulate,  Physical Exam: Vitals:   09/18/24 0410 09/18/24 0829 09/18/24 0956 09/18/24 1000  BP: 100/60 106/68 108/76 108/76  Pulse: 65 77    Resp: 17 18    Temp: 97.7 F (36.5 C) 98.2 F (36.8 C)    TempSrc: Oral Oral    SpO2: 100% 99%    Weight:      Height:       Neurology awake and alert ENT with mild pallor Cardiovascular with S1 and S2 present and regular with no gallops Respiratory with mild rale at bases wheezing, no rhonchi  Abdomen with no distention  No lower extremity edema +   Data Reviewed:    Family Communication: I spoke with patient's son over the phone we talked in detail about patient's condition, plan of care and prognosis and all questions were addressed.   Disposition: Status is: Inpatient Remains inpatient appropriate because: acute gout   Planned Discharge Destination: Home    Author: Elidia Toribio Furnace,  MD 09/18/2024 5:27 PM  For on call review [email or web address].

## 2024-09-19 ENCOUNTER — Ambulatory Visit

## 2024-09-19 ENCOUNTER — Ambulatory Visit: Admitting: Cardiology

## 2024-09-19 DIAGNOSIS — I5023 Acute on chronic systolic (congestive) heart failure: Secondary | ICD-10-CM | POA: Diagnosis not present

## 2024-09-19 DIAGNOSIS — Z951 Presence of aortocoronary bypass graft: Secondary | ICD-10-CM | POA: Diagnosis not present

## 2024-09-19 DIAGNOSIS — E1121 Type 2 diabetes mellitus with diabetic nephropathy: Secondary | ICD-10-CM | POA: Diagnosis not present

## 2024-09-19 DIAGNOSIS — I10 Essential (primary) hypertension: Secondary | ICD-10-CM | POA: Diagnosis not present

## 2024-09-19 LAB — BASIC METABOLIC PANEL WITH GFR
Anion gap: 12 (ref 5–15)
BUN: 32 mg/dL — ABNORMAL HIGH (ref 8–23)
CO2: 23 mmol/L (ref 22–32)
Calcium: 8.5 mg/dL — ABNORMAL LOW (ref 8.9–10.3)
Chloride: 102 mmol/L (ref 98–111)
Creatinine, Ser: 1.43 mg/dL — ABNORMAL HIGH (ref 0.44–1.00)
GFR, Estimated: 40 mL/min — ABNORMAL LOW (ref >60)
Glucose, Bld: 114 mg/dL — ABNORMAL HIGH (ref 70–99)
Potassium: 3.6 mmol/L (ref 3.5–5.1)
Sodium: 137 mmol/L (ref 135–145)

## 2024-09-19 LAB — GLUCOSE, CAPILLARY
Glucose-Capillary: 102 mg/dL — ABNORMAL HIGH (ref 70–99)
Glucose-Capillary: 144 mg/dL — ABNORMAL HIGH (ref 70–99)
Glucose-Capillary: 114 mg/dL — ABNORMAL HIGH (ref 70–99)
Glucose-Capillary: 153 mg/dL — ABNORMAL HIGH (ref 70–99)

## 2024-09-19 LAB — PROTIME-INR
INR: 2.4 — ABNORMAL HIGH (ref 0.8–1.2)
Prothrombin Time: 27.6 s — ABNORMAL HIGH (ref 11.4–15.2)

## 2024-09-19 LAB — MAGNESIUM: Magnesium: 2.2 mg/dL (ref 1.7–2.4)

## 2024-09-19 MED ORDER — SACUBITRIL-VALSARTAN 24-26 MG PO TABS
1.0000 | ORAL_TABLET | Freq: Two times a day (BID) | ORAL | Status: DC
  Administered 2024-09-19 – 2024-09-20 (×2): 1 via ORAL
  Filled 2024-09-19 (×2): qty 1

## 2024-09-19 MED ORDER — ALUM & MAG HYDROXIDE-SIMETH 200-200-20 MG/5ML PO SUSP
30.0000 mL | ORAL | Status: DC | PRN
  Administered 2024-09-19: 30 mL via ORAL
  Filled 2024-09-19: qty 30

## 2024-09-19 MED ORDER — CARVEDILOL 12.5 MG PO TABS
12.5000 mg | ORAL_TABLET | Freq: Two times a day (BID) | ORAL | Status: DC
  Administered 2024-09-19 – 2024-09-20 (×2): 12.5 mg via ORAL
  Filled 2024-09-19 (×2): qty 1

## 2024-09-19 NOTE — Progress Notes (Signed)
 Patient HR dropping down 48 , right now is 50's. Denies any CP, SOB. Going in and out of 1st degree heart block per Telemetry. Just wanted to make you aware since she was not dropping down this low. KYM Hurst notified, no new orders

## 2024-09-19 NOTE — Plan of Care (Signed)

## 2024-09-19 NOTE — Progress Notes (Signed)
 Mobility Specialist Progress Note:    09/19/24 0930  Mobility  Activity Ambulated independently  Level of Assistance Independent  Assistive Device None  Distance Ambulated (ft) 500 ft  Activity Response Tolerated well  Mobility Referral Yes  Mobility visit 1 Mobility  Mobility Specialist Start Time (ACUTE ONLY) 0930  Mobility Specialist Stop Time (ACUTE ONLY) 0936  Mobility Specialist Time Calculation (min) (ACUTE ONLY) 6 min   Received pt ambulating in room agreeable to session. Pt stated they were feeling a little SOB but only from moving otherwise feeling well. Moving and ambulating well. Returned pt to room w/ all needs met.   Venetia Keel Mobility Specialist Please Neurosurgeon or Rehab Office at [phone]

## 2024-09-19 NOTE — Progress Notes (Signed)
  Progress Note   Patient: Rachael Davis FMW:[id number] DOB: March 29, 1956 DOA: 09/15/2024     3 DOS: the patient was seen and examined on 09/19/2024   Brief hospital course: Rachael Davis was admitted to the hospital with the working diagnosis of heart failure exacerbation  68 yo female with the past medical history of heart failure, coronary artery disease sp CABG, mitral valve replacement, paroxysmal atrial fibrillation, T2DM and CKD who presented with dyspnea.  Reported 48 hrs of worsening dyspnea and lower extremity edema. Noted vibrating defibrillator, not sure if going off.  On her initial physical examination her blood pressure was 154/108, HR 99, RR 20 and 02 saturation 100% Lungs with no wheezing or rhonchi, heart with S1 and S2 present and regular, positive JVD, abdomen with no distention and positive lower extremity edema.   Na 138. K 4.3 Cl 107 bicarbonate 21 glucose 127 bun 18 cr 1.29  Mg 2.1  BNP 2,832  Wbc 7.3 hgb 13.0 plt 144  EKG 92 bpm, normal axis, right bundle branch block, qtc 554, atrial fibrillation with no significant ST segment or T wave changes.   CT chest no evidence of pulmonary embolism. Bilateral ground glass opacities.trace left pleural effusion.   Chest radiograph with cardiomegaly, bilateral hilar vascular congestion, pacemaker defibrillator in place with one right atrial lead and one ventricular lead.   Patient was placed on IV furosemide  for diuresis  Echocardiogram with reduced LV systolic function less than 20%  10/16 improving volume status,   Assessment and Plan: * Acute on chronic systolic CHF (congestive heart failure) (HCC) Echocardiogram with reduced LV systolic function less than 20%, global hypokinesis, RV systolic function preserved, LA with mild dilatation, mitral valve has been replaced, (bioprosthetic),   Urine output is 2,600 ml Systolic blood pressure 120 mmHg range   Patient had one dose of furosemide  to 80 mg IV this morning,  will transition to po loop diuretic in am.  Limited medical therapy due to risk of hypotension and acutely reduced GFR.   Plan to resume entresto , and carvedilol  Continue isosorbide , will hold on hydralazine  for now   Essential hypertension Resume entresto  and carvedilol .  Continue blood pressure monitoring   Diabetes mellitus with nephropathy (HCC) Continue glucose cover and monitoring with insulin  sliding scale.   S/P CABG x 4 No acute coronary syndrome   PAF (paroxysmal atrial fibrillation) (HCC) Continue telemetry monitoring Resume carvedilol  Anticoagulation with warfarin         Subjective: Patient with improvement in dyspnea and edema, improved mobility   Physical Exam: Vitals:   09/19/24 0411 09/19/24 0512 09/19/24 0832 09/19/24 1130  BP: 117/86  120/77 123/74  Pulse: 78  69 75  Resp: 16  16 16   Temp: 98.6 F (37 C)  98.2 F (36.8 C) 98.2 F (36.8 C)  TempSrc: Oral  Oral Oral  SpO2: 100%  100% 99%  Weight:  76.8 kg    Height:       Neurology awake and alert ENT with mild pallor Cardiovascular with S1 and S2 present and regular with no gallops, rubs or murmurs Respiratory with no rales or wheezing, no rhonchi  Abdomen with no distention  Trace lower extremity edema   Data Reviewed:    Family Communication: no family at the bedside   Disposition: Status is: Inpatient Remains inpatient appropriate because: recovering heart failure   Planned Discharge Destination: Home    Author: Elidia Toribio Furnace, MD 09/19/2024 4:35 PM  For on call review [email or web address].

## 2024-09-19 NOTE — Progress Notes (Signed)
 PHARMACY - ANTICOAGULATION CONSULT NOTE  Pharmacy Consult for warfarin Indication: atrial fibrillation  Allergies  Allergen Reactions   Keflex  [Cephalexin ] Rash    Skin on feet peeled.   Asa [Aspirin ]     325 mg ----heartburn Pt can take 81 mg   Enalapril Cough   Lisinopril  Cough    Patient Measurements: Height: 5' 2 (157.5 cm) Weight: 76.8 kg (169 lb 5 oz) IBW/kg (Calculated) : 50.1 HEPARIN  DW (KG): 68.4  Vital Signs: Temp: 98.2 F (36.8 C) (10/17 0832) Temp Source: Oral (10/17 0832) BP: 120/77 (10/17 0832) Pulse Rate: 69 (10/17 0832)  Labs: Recent Labs    09/17/24 0232 09/18/24 0259 09/19/24 0241  LABPROT 26.7* 26.9* 27.6*  INR 2.3* 2.4* 2.4*  CREATININE 2.14* 1.49* 1.43*    Estimated Creatinine Clearance: 35.6 mL/min (A) (by C-G formula based on SCr of 1.43 mg/dL (H)).   Medical History: Past Medical History:  Diagnosis Date   Arrhythmia    Hx PAF, PVC's   CHF (congestive heart failure) (HCC)    Coronary artery disease    04/04/2017 STEMI   Diabetes [facility])    H/O mitral valve replacement 09/21/2021   Heart attack (HCC)    per pt reprot   Hyperlipidemia    Hypertension    New onset atrial fibrillation (HCC) 05/2017    Assessment: 68yo female c/o SOB, admitted w/ acute on chronic HFrEF, to continue warfarin for PAF (also has bioMVR and h/o VTE).  Patient taking 5mg  daily, last dose of warfarin Sunday morning. -INR= 2.4  Goal of Therapy:  INR 2-3   Plan:  Warfarin 5mg  daily Daily INR.   Prentice Poisson, PharmD Clinical Pharmacist **Pharmacist phone directory can now be found on [email or web address] ([email or web address]).  Listed under [facility].

## 2024-09-20 ENCOUNTER — Other Ambulatory Visit (HOSPITAL_COMMUNITY): Payer: Self-pay

## 2024-09-20 DIAGNOSIS — E1169 Type 2 diabetes mellitus with other specified complication: Secondary | ICD-10-CM

## 2024-09-20 DIAGNOSIS — E785 Hyperlipidemia, unspecified: Secondary | ICD-10-CM

## 2024-09-20 DIAGNOSIS — I5023 Acute on chronic systolic (congestive) heart failure: Secondary | ICD-10-CM | POA: Diagnosis not present

## 2024-09-20 DIAGNOSIS — E66811 Obesity, class 1: Secondary | ICD-10-CM

## 2024-09-20 DIAGNOSIS — N1832 Chronic kidney disease, stage 3b: Secondary | ICD-10-CM | POA: Diagnosis not present

## 2024-09-20 DIAGNOSIS — I10 Essential (primary) hypertension: Secondary | ICD-10-CM | POA: Diagnosis not present

## 2024-09-20 DIAGNOSIS — I480 Paroxysmal atrial fibrillation: Secondary | ICD-10-CM | POA: Diagnosis not present

## 2024-09-20 LAB — RENAL FUNCTION PANEL
Albumin: 3.2 g/dL — ABNORMAL LOW (ref 3.5–5.0)
Anion gap: 14 (ref 5–15)
BUN: 34 mg/dL — ABNORMAL HIGH (ref 8–23)
CO2: 25 mmol/L (ref 22–32)
Calcium: 8.6 mg/dL — ABNORMAL LOW (ref 8.9–10.3)
Chloride: 100 mmol/L (ref 98–111)
Creatinine, Ser: 1.52 mg/dL — ABNORMAL HIGH (ref 0.44–1.00)
GFR, Estimated: 37 mL/min — ABNORMAL LOW (ref >60)
Glucose, Bld: 119 mg/dL — ABNORMAL HIGH (ref 70–99)
Phosphorus: 3.1 mg/dL (ref 2.5–4.6)
Potassium: 3.6 mmol/L (ref 3.5–5.1)
Sodium: 139 mmol/L (ref 135–145)

## 2024-09-20 LAB — MAGNESIUM: Magnesium: 2.5 mg/dL — ABNORMAL HIGH (ref 1.7–2.4)

## 2024-09-20 LAB — PROTIME-INR
INR: 2.3 — ABNORMAL HIGH (ref 0.8–1.2)
Prothrombin Time: 26.6 s — ABNORMAL HIGH (ref 11.4–15.2)

## 2024-09-20 LAB — GLUCOSE, CAPILLARY
Glucose-Capillary: 108 mg/dL — ABNORMAL HIGH (ref 70–99)
Glucose-Capillary: 121 mg/dL — ABNORMAL HIGH (ref 70–99)

## 2024-09-20 MED ORDER — TORSEMIDE 20 MG PO TABS
40.0000 mg | ORAL_TABLET | Freq: Every day | ORAL | 0 refills | Status: DC
  Filled 2024-09-20: qty 60, 30d supply, fill #0

## 2024-09-20 MED ORDER — TORSEMIDE 20 MG PO TABS: 40.0000 mg | ORAL_TABLET | Freq: Every day | ORAL | Status: DC

## 2024-09-20 MED ORDER — TORSEMIDE 20 MG PO TABS: 40.0000 mg | ORAL_TABLET | Freq: Every day | ORAL | 0 refills | Status: AC

## 2024-09-20 MED ORDER — EMPAGLIFLOZIN 10 MG PO TABS: 10.0000 mg | ORAL_TABLET | Freq: Every day | ORAL | 0 refills | Status: DC

## 2024-09-20 MED ORDER — DAPAGLIFLOZIN PROPANEDIOL 10 MG PO TABS
10.0000 mg | ORAL_TABLET | Freq: Every day | ORAL | 0 refills | Status: DC
  Filled 2024-09-20: qty 30, 30d supply, fill #0

## 2024-09-20 MED ORDER — POTASSIUM CHLORIDE CRYS ER 20 MEQ PO TBCR
40.0000 meq | EXTENDED_RELEASE_TABLET | Freq: Once | ORAL | Status: AC
  Administered 2024-09-20: 40 meq via ORAL
  Filled 2024-09-20: qty 2

## 2024-09-20 NOTE — Discharge Summary (Addendum)
 Physician Discharge Summary   Patient: Rachael Davis MRN: [id number] DOB: 1956/06/17  Admit date:     09/15/2024  Discharge date: 09/20/24  Discharge Physician: Elidia Toribio Furnace   PCP: Sun, Vyvyan, MD   Recommendations at discharge:    Patient will continue diuresis with torsemide  40 mg po daily Resume guideline directed medical therapy with entresto , carvedilol  and SGLT 2 inh. Holding on mineralocorticoid receptor blocker due to reduced GFR. She has been advised to stay adherent to her medical therapy.  Holding hydralazine  to avoid hypotension Follow up renal function and electrolytes in 7 days as outpatient Follow up with Dr Austin in 7 to 10 days Follow up with Cardiology Heart Failure as outpatient.    Discharge Diagnoses: Principal Problem:   Acute on chronic systolic CHF (congestive heart failure) (HCC) Active Problems:   Essential hypertension   Diabetes mellitus with nephropathy (HCC)   S/P CABG x 4   PAF (paroxysmal atrial fibrillation) (HCC)  Resolved Problems:   * No resolved hospital problems. [facility] Course: [name] was admitted to the hospital with the working diagnosis of heart failure exacerbation  68 yo female with the past medical history of heart failure, coronary artery disease sp CABG, mitral valve replacement, paroxysmal atrial fibrillation, T2DM and CKD who presented with dyspnea.  Reported 48 hrs of worsening dyspnea and lower extremity edema. Noted vibrating defibrillator, not sure if going off.  On her initial physical examination her blood pressure was 154/108, HR 99, RR 20 and 02 saturation 100% Lungs with no wheezing or rhonchi, heart with S1 and S2 present and regular, positive JVD, abdomen with no distention and positive lower extremity edema.   Na 138. K 4.3 Cl 107 bicarbonate 21 glucose 127 bun 18 cr 1.29  Mg 2.1  BNP 2,832  Wbc 7.3 hgb 13.0 plt 144  EKG 92 bpm, normal axis, right bundle branch block, qtc 554, atrial  fibrillation with no significant ST segment or T wave changes.   CT chest no evidence of pulmonary embolism. Bilateral ground glass opacities.trace left pleural effusion.   Chest radiograph with cardiomegaly, bilateral hilar vascular congestion, pacemaker defibrillator in place with one right atrial lead and one ventricular lead.   Patient was placed on IV furosemide  for diuresis  Echocardiogram with reduced LV systolic function less than 20%  10/16 improving volume status,  10/18 resumed guideline directed medical therapy with good toleration Plan to follow up as outpatient.   Assessment and Plan: * Acute on chronic systolic CHF (congestive heart failure) (HCC) Echocardiogram with reduced LV systolic function less than 20%, global hypokinesis, RV systolic function preserved, LA with mild dilatation, mitral valve has been replaced, (bioprosthetic),   Patient was placed on IV furosemide  for diuresis, negative fluid balance was achieved, - 5,140 ml, with significant improvement in her symptoms.  Patient lost about 6 kg during this hospitalization.   Patient will continue diuresis at home with torsemide  40 mg po daily. Continue guideline directed medical therapy with entresto , carvedilol , SGLT 2 inh.  Continue isosorbide , hold on hydralazine  to avoid hypotension.  Holding mineralocorticoid receptor blocker due to reduced GFR.  Follow up with heart failure clinic as outpatient.   Essential hypertension Resume entresto  and carvedilol .  Hold on hydralazine  to avoid hypotension.   Chronic kidney disease, stage 3b (HCC) Patient tolerated well diuresis, at the time of her discharge her renal function has a serum cr of 1,52 with K at 3,6 and serum bicarbonate at 25  Na 139  and P 3.1 Mg 2.5   Plan to continue diuresis with torsemide  40 mg po daily Follow up renal function and electrolytes as outpatient.   PAF (paroxysmal atrial fibrillation) (HCC) Telemetry with sinus rhythm with 1st  degree AV bock, intermittent positive ventricular paced rhythm  Rate 50 to 60 bpm.   Continue rate control with carvedilol  Anticoagulation with warfarin  Her discharge INR is 2,3   S/P CABG x 4 No acute coronary syndrome   Type 2 diabetes mellitus with hyperlipidemia (HCC) Patient was placed on insulin  sliding scale for glucose cover and monitoring At discharge will resume oral hypoglycemic therapy with glipizide .  Resume metformin  and semaglutide.   Class 1 obesity Calculated BMI is 30,8      Consultants: none  Procedures performed: none   Disposition: Home Diet recommendation:  Cardiac diet DISCHARGE MEDICATION: Allergies as of 09/20/2024       Reactions   Keflex  [cephalexin ] Rash   Skin on feet peeled.   Asa [aspirin ]    325 mg ----heartburn Pt can take 81 mg   Enalapril Cough   Lisinopril  Cough        Medication List     STOP taking these medications    dapagliflozin  propanediol 10 MG Tabs tablet Commonly known as: FARXIGA  Replaced by: empagliflozin 10 MG Tabs tablet   hydrALAZINE  25 MG tablet Commonly known as: APRESOLINE    potassium chloride  SA 20 MEQ tablet Commonly known as: KLOR-CON  M       TAKE these medications    acetaminophen  325 MG tablet Commonly known as: TYLENOL  Take 2 tablets (650 mg total) by mouth every 6 (six) hours as needed for mild pain or headache.   aspirin  EC 81 MG tablet Take 81 mg by mouth daily.   atorvastatin  40 MG tablet Commonly known as: LIPITOR  TAKE 1 TABLET BY MOUTH EVERY DAY   carvedilol  12.5 MG tablet Commonly known as: COREG  Take 1 tablet (12.5 mg total) by mouth 2 (two) times daily.   empagliflozin 10 MG Tabs tablet Commonly known as: Jardiance Take 1 tablet (10 mg total) by mouth daily. Replaces: dapagliflozin  propanediol 10 MG Tabs tablet   Entresto  24-26 MG Generic drug: sacubitril -valsartan  TAKE 1 TABLET BY MOUTH TWICE A DAY   glipiZIDE  10 MG 24 hr tablet Commonly known as: GLUCOTROL   XL Take 1 tablet (10 mg total) by mouth daily with breakfast.   isosorbide  mononitrate 30 MG 24 hr tablet Commonly known as: IMDUR  Take 1 tablet (30 mg total) by mouth daily.   metFORMIN  1000 MG tablet Commonly known as: GLUCOPHAGE  Take 1,000 mg by mouth 2 (two) times daily with a meal.   Rybelsus 14 MG Tabs Generic drug: Semaglutide Take 1 tablet by mouth every morning.   torsemide  20 MG tablet Commonly known as: DEMADEX  Take 2 tablets (40 mg total) by mouth daily. What changed: See the new instructions.   warfarin 5 MG tablet Commonly known as: COUMADIN  Take as directed. If you are unsure how to take this medication, talk to your nurse or doctor. Original instructions: TAKE 1 TABLET BY MOUTH DAILY OR AS DIRECTED BY COUMADIN  CLINIC What changed: See the new instructions.        Follow-up Information     Sun, Vyvyan, MD Follow up.   Specialty: Family Medicine Contact information: [address] [place] [address] [phone]         [facility] Follow up on 10/01/2024.   Specialty:  Cardiology Why: [facility] 3:30 PM Entrance C, Free Valet parking Contact information: [address] [employer]  [phone]               Discharge Exam: Fredricka Weights   09/18/24 0036 09/19/24 0512 09/20/24 0531  Weight: 76 kg 76.8 kg 76.4 kg   BP (!) 98/55 (BP Location: Left Arm)   Pulse (!) 41   Temp 97.6 F (36.4 C) (Oral)   Resp 19   Ht 5' 2 (1.575 m)   Wt 76.4 kg   SpO2 100%   BMI 30.81 kg/m   Patient with no chest pain and no dyspnea, no PND or orthopnea, lower extremity edema continue to improve.   Neurology awake and alert ENT with mild pallor Cardiovascular with S1 and S2 present and regular with no gallops, rubs or murmurs No JVD  Respiratory with no rales or wheezing, no rhonchi  Abdomen with no distention  Trace lower extremity edema    Condition at discharge: stable  The results of significant diagnostics from this hospitalization (including imaging, microbiology, ancillary and laboratory) are listed below for reference.   Imaging Studies: ECHOCARDIOGRAM COMPLETE Result Date: 09/17/2024    ECHOCARDIOGRAM REPORT   Patient Name:   Rachael Davis Date of Exam: 09/17/2024 Medical Rec #:  [id number]       Height:       62.0 in Accession #:    [id number]      Weight:       168.6 lb Date of Birth:  03/01/1956       BSA:          1.778 m Patient Age:    68 years        BP:           116/90 mmHg Patient Gender: F               HR:           82 bpm. Exam Location:  Inpatient Procedure: 2D Echo, Cardiac Doppler, Color Doppler and Intracardiac            Opacification Agent (Both Spectral and Color Flow Doppler were            utilized during procedure). Indications:    Dyspnea  History:        Patient has prior history of Echocardiogram examinations, most                 recent 12/19/2023. CHF and Cardiomyopathy, Previous Myocardial                 Infarction and CAD, Defibrillator, Arrythmias:Atrial                 Fibrillation, Tachycardia and RBBB, Signs/Symptoms:Dyspnea; Risk                 Factors:Hypertension and Diabetes.                  Mitral Valve: 25 mm Magna Ease bioprosthetic valve is present in                 the mitral position. Procedure Date: 09/21/21.  Sonographer:    Juliene Rucks Referring Phys: Viyaan Champine, DANIEL  Sonographer Comments: Image acquisition challenging due to patient body habitus. IMPRESSIONS  1. Left ventricular ejection fraction, by estimation, is <20%. The left ventricle has severely decreased function. The left ventricle demonstrates global hypokinesis. Left ventricular diastolic parameters are  indeterminate.  2. Right ventricular systolic function is normal. The right ventricular size is normal.  3. Left atrial size was mildly dilated.  4. The mitral valve has been repaired/replaced. No evidence of  mitral valve regurgitation. No evidence of mitral stenosis. There is a 25 mm Magna Ease bioprosthetic present in the mitral position. Procedure Date: 09/21/21. Echo findings are consistent with normal structure and function of the mitral valve prosthesis.  5. The aortic valve is tricuspid. Aortic valve regurgitation is not visualized. No aortic stenosis is present.  6. The inferior vena cava is normal in size with greater than 50% respiratory variability, suggesting right atrial pressure of 3 mmHg. FINDINGS  Left Ventricle: Left ventricular ejection fraction, by estimation, is <20%. The left ventricle has severely decreased function. The left ventricle demonstrates global hypokinesis. Definity  contrast agent was given IV to delineate the left ventricular endocardial borders. The left ventricular internal cavity size was normal in size. There is no left ventricular hypertrophy. Left ventricular diastolic parameters are indeterminate. Right Ventricle: The right ventricular size is normal. Right ventricular systolic function is normal. Left Atrium: Left atrial size was mildly dilated. Right Atrium: Right atrial size was normal in size. Pericardium: There is no evidence of pericardial effusion. Mitral Valve: The mitral valve has been repaired/replaced. No evidence of mitral valve regurgitation. There is a 25 mm Magna Ease bioprosthetic present in the mitral position. Procedure Date: 09/21/21. Echo findings are consistent with normal structure and function of the mitral valve prosthesis. No evidence of mitral valve stenosis. MV peak gradient, 9.7 mmHg. The mean mitral valve gradient is 4.0 mmHg. Tricuspid Valve: The tricuspid valve is normal in structure. Tricuspid valve regurgitation is trivial. No evidence of tricuspid stenosis. Aortic Valve: The aortic valve is tricuspid. Aortic valve regurgitation is not visualized. No aortic stenosis is present. Pulmonic Valve: The pulmonic valve was normal in structure. Pulmonic  valve regurgitation is trivial. No evidence of pulmonic stenosis. Aorta: The aortic root is normal in size and structure. Venous: The inferior vena cava is normal in size with greater than 50% respiratory variability, suggesting right atrial pressure of 3 mmHg. IAS/Shunts: No atrial level shunt detected by color flow Doppler. Additional Comments: A device lead is visualized.  LEFT VENTRICLE PLAX 2D LVIDd:         5.30 cm      Diastology LVIDs:         4.70 cm      LV e' medial:  4.03 cm/s LV PW:         1.00 cm      LV e' lateral: 3.59 cm/s LV IVS:        0.90 cm LVOT diam:     2.10 cm LV SV:         43 LV SV Index:   24 LVOT Area:     3.46 cm  LV Volumes (MOD) LV vol d, MOD A2C: 215.0 ml LV vol d, MOD A4C: 245.0 ml LV vol s, MOD A2C: 171.0 ml LV vol s, MOD A4C: 177.0 ml LV SV MOD A2C:     44.0 ml LV SV MOD A4C:     245.0 ml LV SV MOD BP:      53.7 ml IVC IVC diam: 1.10 cm LEFT ATRIUM             Index LA diam:        3.80 cm 2.14 cm/m LA Vol (A2C):   63.0 ml 35.44 ml/m LA Vol (A4C):  47.7 ml 26.83 ml/m LA Biplane Vol: 55.9 ml 31.44 ml/m  AORTIC VALVE LVOT Vmax:   75.50 cm/s LVOT Vmean:  50.300 cm/s LVOT VTI:    0.124 m  AORTA Ao Root diam: 3.10 cm Ao Asc diam:  3.00 cm MITRAL VALVE MV Area VTI:  1.27 cm   SHUNTS MV Peak grad: 9.7 mmHg   Systemic VTI:  0.12 m MV Mean grad: 4.0 mmHg   Systemic Diam: 2.10 cm MV Vmax:      1.56 m/s MV Vmean:     92.4 cm/s Redell Shallow MD Electronically signed by Redell Shallow MD Signature Date/Time: 09/17/2024/3:48:31 PM    Final    CT Angio Chest PE W and/or Wo Contrast Result Date: 09/16/2024 EXAM: CTA of the Chest with contrast for PE 09/16/2024 01:19:47 AM TECHNIQUE: CTA of the chest was performed with the administration of 75 mL of iohexol  (OMNIPAQUE ) 350 MG/ML injection. Multiplanar reformatted images are provided for review. MIP images are provided for review. Automated exposure control, iterative reconstruction, and/or weight based adjustment of the mA/kV was  utilized to reduce the radiation dose to as low as reasonably achievable. COMPARISON: Chest radiograph dated 09/15/2024. CLINICAL HISTORY: Signs of acute right heart strain, new SOB. Patient complains of shortness of breath that started yesterday. Patient denies pain, states her defibrillator is vibrating so she thinks it is going off. Patient has had nausea and vomiting. FINDINGS: PULMONARY ARTERIES: Pulmonary arteries are adequately opacified for evaluation. No pulmonary embolism. Main pulmonary artery is normal in caliber. MEDIASTINUM: Cardiomegaly. Moderate 3 vessel coronary atherosclerosis. Status post CABG. Mitral valve annular calcifications. Signs of acute right heart strain. There is no acute abnormality of the thoracic aorta. LYMPH NODES: Small mediastinal nodes, likely reactive. No hilar or axillary lymphadenopathy. LUNGS AND PLEURA: Suspected faint perihilar edema. Linear scarring/atelectasis in the lingula, right middle lobe, and bilateral lower lobes. Trace left pleural effusion. No pneumothorax. UPPER ABDOMEN: Limited images of the upper abdomen are unremarkable. SOFT TISSUES AND BONES: Median sternotomy. Mild degenerative changes of the mid thoracic spine. No acute soft tissue abnormality. IMPRESSION: 1. No evidence of pulmonary embolism. 2. Cardiomegaly with suspected mild pulmonary edema and trace left pleural effusion. Electronically signed by: Pinkie Pebbles MD 09/16/2024 01:27 AM EDT RP Workstation: [id number]   DG Chest 1 View Result Date: 09/15/2024 CLINICAL DATA:  Shortness of breath. EXAM: CHEST  1 VIEW COMPARISON:  12/17/2023. FINDINGS: Stable cardiomegaly. Status post median sternotomy, CABG, and mitral valve replacement. Left subclavian AICD. Central pulmonary vascular congestion with possible mild interstitial edema. No sizable pleural effusion. No pneumothorax. No acute osseous abnormality. IMPRESSION: Cardiomegaly with central pulmonary vascular congestion and possible mild  interstitial edema. Electronically Signed   By: Harrietta Sherry M.D.   On: 09/15/2024 15:49    Microbiology: Results for orders placed or performed during the hospital encounter of 12/17/23  Resp panel by RT-PCR (RSV, Flu A&B, Covid) Anterior Nasal Swab     Status: None   Collection Time: 12/17/23  2:33 PM   Specimen: Anterior Nasal Swab  Result Value Ref Range Status   SARS Coronavirus 2 by RT PCR NEGATIVE NEGATIVE Final   Influenza A by PCR NEGATIVE NEGATIVE Final   Influenza B by PCR NEGATIVE NEGATIVE Final    Comment: (NOTE) The Xpert Xpress SARS-CoV-2/FLU/RSV plus assay is intended as an aid in the diagnosis of influenza from Nasopharyngeal swab specimens and should not be used as a sole basis for treatment. Nasal washings and aspirates are unacceptable for Xpert  Xpress SARS-CoV-2/FLU/RSV testing.  Fact Sheet for Patients: BloggerCourse.com  Fact Sheet for Healthcare Providers: SeriousBroker.it  This test is not yet approved or cleared by the [place]  FDA and has been authorized for detection and/or diagnosis of SARS-CoV-2 by FDA under an Emergency Use Authorization (EUA). This EUA will remain in effect (meaning this test can be used) for the duration of the COVID-19 declaration under Section 564(b)(1) of the Act, 21 U.S.C. section 360bbb-3(b)(1), unless the authorization is terminated or revoked.     Resp Syncytial Virus by PCR NEGATIVE NEGATIVE Final    Comment: (NOTE) Fact Sheet for Patients: BloggerCourse.com  Fact Sheet for Healthcare Providers: SeriousBroker.it  This test is not yet approved or cleared by the [place]  FDA and has been authorized for detection and/or diagnosis of SARS-CoV-2 by FDA under an Emergency Use Authorization (EUA). This EUA will remain in effect (meaning this test can be used) for the duration of the COVID-19 declaration under  Section 564(b)(1) of the Act, 21 U.S.C. section 360bbb-3(b)(1), unless the authorization is terminated or revoked.  Performed at [facility], [address], [place], [place] [address]     Labs: CBC: Recent Labs  Lab 09/15/24 1455 09/16/24 0650  WBC 7.3 8.3  NEUTROABS  --  6.7  HGB 13.0 13.3  HCT 42.8 44.0  MCV 74.8* 75.0*  PLT 144* 155   Basic Metabolic Panel: Recent Labs  Lab 09/15/24 1455 09/16/24 0650 09/17/24 0232 09/18/24 0259 09/19/24 0241 09/20/24 0310  NA 138 138 137 137 137 139  K 4.3 4.3 3.7 3.6 3.6 3.6  CL 107 102 101 102 102 100  CO2 21* 21* 23 22 23 25   GLUCOSE 127* 171* 151* 129* 114* 119*  BUN 18 17 35* 36* 32* 34*  CREATININE 1.29* 1.39* 2.14* 1.49* 1.43* 1.52*  CALCIUM  9.2 9.1 8.5* 8.3* 8.5* 8.6*  MG 2.1 2.0  --  2.1 2.2 2.5*  PHOS  --   --   --   --   --  3.1   Liver Function Tests: Recent Labs  Lab 09/16/24 0650 09/20/24 0310  AST 25  --   ALT 21  --   ALKPHOS 136*  --   BILITOT 1.0  --   PROT 7.5  --   ALBUMIN  4.0 3.2*   CBG: Recent Labs  Lab 09/19/24 1127 09/19/24 1647 09/19/24 2116 09/20/24 0611 09/20/24 1113  GLUCAP 144* 114* 153* 108* 121*    Discharge time spent: greater than 30 minutes.  Signed: Elidia Toribio Furnace, MD [facility] 09/20/2024

## 2024-09-20 NOTE — Progress Notes (Signed)
 PHARMACY - ANTICOAGULATION CONSULT NOTE  Pharmacy Consult for warfarin Indication: atrial fibrillation  Allergies  Allergen Reactions   Keflex  [Cephalexin ] Rash    Skin on feet peeled.   Asa Cristy.Cos ]     325 mg ----heartburn Pt can take 81 mg   Enalapril Cough   Lisinopril  Cough    Patient Measurements: Height: 5' 2 (157.5 cm) Weight: 76.4 kg (168 lb 6.9 oz) IBW/kg (Calculated) : 50.1 HEPARIN  DW (KG): 68.4  Vital Signs: Temp: 97.5 F (36.4 C) (10/18 0756) Temp Source: Oral (10/18 0756) BP: 118/63 (10/18 0756) Pulse Rate: 75 (10/18 0756)  Labs: Recent Labs    09/18/24 0259 09/19/24 0241 09/20/24 0310  LABPROT 26.9* 27.6* 26.6*  INR 2.4* 2.4* 2.3*  CREATININE 1.49* 1.43* 1.52*    Estimated Creatinine Clearance: 33.4 mL/min (A) (by C-G formula based on SCr of 1.52 mg/dL (H)).   Medical History: Past Medical History:  Diagnosis Date   Arrhythmia    Hx PAF, PVC's   CHF (congestive heart failure) (HCC)    Coronary artery disease    04/04/2017 STEMI   Diabetes [facility])    H/O mitral valve replacement 09/21/2021   Heart attack (HCC)    per pt reprot   Hyperlipidemia    Hypertension    New onset atrial fibrillation (HCC) 05/2017    Assessment: 68yo female c/o SOB, admitted w/ acute on chronic HFrEF, to continue warfarin for PAF (also has bioMVR and h/o VTE).  Patient taking 5mg  daily, last dose of warfarin PTA was 10/12 morning.  10/18 INR: 2.3  Goal of Therapy:  INR 2-3   Plan:  Continue warfarin 5mg  daily Continue daily INR check  Thank you for allowing pharmacy to be involved with this patient's care.  Mendel Barter, PharmD PGY1 Clinical Pharmacist [facility]  09/20/2024 8:40 AM

## 2024-09-20 NOTE — Progress Notes (Addendum)
 Discharge Nurse Summary: DC order noted per MD. DC RN at bedside with patient. Patient agreeable with discharge plan, states family will arrive soon for pickup. AVS printed/reviewed. Discussed thorough edu on importance of compliance with med treatment, low sodium heart healthy diet, daily weights, s/s to report to MD for early identification of CHF exacerbation to manage safe at home.   Patient states prior auth pending for jardiance, instructed to notify cardiology team if there are any issues with prescriptions in the case additional documentation is needed to support approval. PIV removed, skin intact. No DME needs. No home/TOC meds. CP/Edu resolved. Telemonitor returned to charging station. All belongings accounted for. Patient wheeled downstairs for discharge by private auto.   Rosario EMERSON Lund, RN

## 2024-09-20 NOTE — Plan of Care (Signed)

## 2024-09-20 NOTE — Progress Notes (Signed)
 Patient had a 5 beat run of wide QRS or Vtach. Patient is asymptomatic.Notified MD

## 2024-09-20 NOTE — Assessment & Plan Note (Signed)
 Patient tolerated well diuresis, at the time of her discharge her renal function has a serum cr of 1,52 with K at 3,6 and serum bicarbonate at 25  Na 139 and P 3.1 Mg 2.5   Plan to continue diuresis with torsemide  40 mg po daily Follow up renal function and electrolytes as outpatient.

## 2024-09-20 NOTE — Progress Notes (Signed)
 Mobility Specialist Progress Note:    09/20/24 1048  Mobility  Activity Ambulated independently  Level of Assistance Independent after set-up  Assistive Device None  Distance Ambulated (ft) 500 ft  Activity Response Tolerated well  Mobility Referral Yes  Mobility visit 1 Mobility  Mobility Specialist Start Time (ACUTE ONLY) 1048  Mobility Specialist Stop Time (ACUTE ONLY) 1053  Mobility Specialist Time Calculation (min) (ACUTE ONLY) 5 min   Pt pleasant and agreeable to session. No c/o any symptoms. Pt happy that they are able to walk further, talk, and ambulate at the same time w/o much SOB. Says that it is feeling better. Returned pt to room w/ all needs met.   Venetia Keel Mobility Specialist Please Neurosurgeon or Rehab Office at [phone]

## 2024-09-20 NOTE — Assessment & Plan Note (Signed)
Calculated BMI is 30,8  

## 2024-09-22 ENCOUNTER — Encounter: Payer: Self-pay | Admitting: Pulmonary Disease

## 2024-09-22 ENCOUNTER — Inpatient Hospital Stay (INDEPENDENT_AMBULATORY_CARE_PROVIDER_SITE_OTHER): Admitting: Pulmonary Disease

## 2024-09-22 VITALS — BP 111/65 | HR 57 | Ht 62.0 in | Wt 171.0 lb

## 2024-09-22 DIAGNOSIS — I2510 Atherosclerotic heart disease of native coronary artery without angina pectoris: Secondary | ICD-10-CM

## 2024-09-22 DIAGNOSIS — I472 Ventricular tachycardia, unspecified: Secondary | ICD-10-CM

## 2024-09-22 DIAGNOSIS — R002 Palpitations: Secondary | ICD-10-CM

## 2024-09-22 DIAGNOSIS — Z95810 Presence of automatic (implantable) cardiac defibrillator: Secondary | ICD-10-CM

## 2024-09-22 DIAGNOSIS — Z951 Presence of aortocoronary bypass graft: Secondary | ICD-10-CM

## 2024-09-22 DIAGNOSIS — I4510 Unspecified right bundle-branch block: Secondary | ICD-10-CM

## 2024-09-22 DIAGNOSIS — I440 Atrioventricular block, first degree: Secondary | ICD-10-CM

## 2024-09-22 DIAGNOSIS — I5022 Chronic systolic (congestive) heart failure: Secondary | ICD-10-CM | POA: Diagnosis not present

## 2024-09-22 LAB — CUP PACEART INCLINIC DEVICE CHECK
Date Time Interrogation Session: 20251020165034
Implantable Lead Connection Status: 753985
Implantable Lead Implant Date: 20230728
Implantable Lead Location: 753860
Implantable Lead Model: 138
Implantable Lead Serial Number: 303552
Implantable Pulse Generator Implant Date: 20230728
Pulse Gen Serial Number: 217674

## 2024-09-22 NOTE — Progress Notes (Signed)
  Electrophysiology Office Note:   Date:  09/22/2024  ID:  Rachael Davis, DOB 1956-05-29, MRN [id number]  Primary Cardiologist: Annabella Scarce, MD Primary Heart Failure: None Electrophysiologist: Will Gladis Norton, MD       History of Present Illness:   Rachael Davis is a 68 y.o. female with h/o HFrEF, VT, RBBB, 1AVB, paroxysmal AF, CAD s/p CABG with MV replacement, HTN, DM  seen today for post hospital follow up.    Admitted 10/13-10/18/25 for 48 hours of increasing shortness of breath in the setting of decompensated HF. 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 CT PE negative. She reported a vibrating sensation from her defibrillator. She was diuresed for negative fluid balance (-5L) with improvement in her symptoms - discharge wt 76.4 kg. From chart review, I can not find a device interrogation during that admit.   Since discharge from hospital the patient reports feeling much better since admit. She has not missed any medications and her fluid status is good / no swelling.  She reports a vibration at times in her left underarm / chest.  No pain or audible alert.   She denies chest pain, palpitations, dyspnea, PND, orthopnea, nausea, vomiting, dizziness, syncope, edema, weight gain, or early satiety.   Review of systems complete and found to be negative unless listed in HPI.   EP Information / Studies Reviewed:    EKG is not ordered today. EKG from 09/17/24 reviewed which showed SR with 1st degree AVB      ICD Interrogation-  reviewed in detail today,  See PACEART report.  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 for ICM History of appropriate therapy: No History of AAD therapy: No   Risk Assessment/Calculations:              Physical Exam:   VS:  BP 111/65   Pulse (!) 57   Ht 5' 2 (1.575 m)   Wt 171 lb (77.6 kg)   SpO2 99%   BMI 31.28 kg/m    Wt Readings from Last 3 Encounters:   09/22/24 171 lb (77.6 kg)  09/20/24 168 lb 6.9 oz (76.4 kg)  07/09/24 179 lb 12.8 oz (81.6 kg)     GEN: Well nourished, well developed in no acute distress NECK: No JVD; No carotid bruits CARDIAC: Regular rate and rhythm, no murmurs, rubs, gallops, device site wnl, no tethering RESPIRATORY:  Clear to auscultation without rales, wheezing or rhonchi  ABDOMEN: Soft, non-tender, non-distended EXTREMITIES:  No edema; No deformity   ASSESSMENT AND PLAN:    Chronic Systolic Dysfunction s/p [employer] single chamber ICD  PVC's / VT  RBBB / 1AVB -Euvolemic on exam / by device  -Stable on an appropriate medical regimen -Normal ICD function -See Pace Art report -No changes today -no VT or other alerts noted on device to explain vibration in left underarm / chest / arm, outputs low so not likely diaphragmatic stim from auto thresholds  Secondary Hypercoagulable State  -continue warfarin  Hypertension  -well controlled on current regimen    Remote SCAF  -monitor on device   CAD s/p CABG  -no anginal symptoms  -per Cardiology   Disposition:   Follow up with Dr. Norton in 12 months   Signed, Daphne Barrack, NP-C, AGACNP-BC [facility] - Electrophysiology  09/22/2024, 4:43 PM

## 2024-09-22 NOTE — Patient Instructions (Signed)
 Medication Instructions:  Your physician recommends that you continue on your current medications as directed. Please refer to the Current Medication list given to you today.  *If you need a refill on your cardiac medications before your next appointment, please call your pharmacy*  Lab Work: CMP, MAG-TODAY If you have labs (blood work) drawn today and your tests are completely normal, you will receive your results only by: MyChart Message (if you have MyChart) OR A paper copy in the mail If you have any lab test that is abnormal or we need to change your treatment, we will call you to review the results.  Follow-Up: At [facility], you and your health needs are our priority.  As part of our continuing mission to provide you with exceptional heart care, our providers are all part of one team.  This team includes your primary Cardiologist (physician) and Advanced Practice Providers or APPs (Physician Assistants and Nurse Practitioners) who all work together to provide you with the care you need, when you need it.  Your next appointment:   1 year(s)  Provider:   Soyla Norton, MD

## 2024-09-23 ENCOUNTER — Ambulatory Visit: Payer: Self-pay | Admitting: Pulmonary Disease

## 2024-09-23 LAB — COMPREHENSIVE METABOLIC PANEL WITH GFR
ALT: 29 IU/L (ref 0–32)
AST: 27 IU/L (ref 0–40)
Albumin: 4.3 g/dL (ref 3.9–4.9)
Alkaline Phosphatase: 156 IU/L — ABNORMAL HIGH (ref 49–135)
BUN/Creatinine Ratio: 24 (ref 12–28)
BUN: 39 mg/dL — ABNORMAL HIGH (ref 8–27)
Bilirubin Total: 0.3 mg/dL (ref 0.0–1.2)
CO2: 21 mmol/L (ref 20–29)
Calcium: 9.5 mg/dL (ref 8.7–10.3)
Chloride: 104 mmol/L (ref 96–106)
Creatinine, Ser: 1.63 mg/dL — ABNORMAL HIGH (ref 0.57–1.00)
Globulin, Total: 3 g/dL (ref 1.5–4.5)
Glucose: 83 mg/dL (ref 70–99)
Potassium: 4.7 mmol/L (ref 3.5–5.2)
Sodium: 144 mmol/L (ref 134–144)
Total Protein: 7.3 g/dL (ref 6.0–8.5)
eGFR: 34 mL/min/1.73 — ABNORMAL LOW (ref >59)

## 2024-09-23 LAB — MAGNESIUM: Magnesium: 2.6 mg/dL — ABNORMAL HIGH (ref 1.6–2.3)

## 2024-10-01 ENCOUNTER — Ambulatory Visit: Payer: No Typology Code available for payment source

## 2024-10-01 ENCOUNTER — Inpatient Hospital Stay (HOSPITAL_COMMUNITY)

## 2024-10-01 DIAGNOSIS — I255 Ischemic cardiomyopathy: Secondary | ICD-10-CM | POA: Diagnosis not present

## 2024-10-01 NOTE — Progress Notes (Incomplete)
 Advanced Heart Failure Clinic Note   PCP: Sun, Vyvyan, MD Primary Cardiologist: Dr. Raford HF Cardiologist: Dr. Cherrie  HPI: 68 y.o. 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   She was previously followed by Dr. Cherrie in Advanced Heart Failure clinic, graduated from clinic in 05/2019. Echo 05/2019: EF 35-40%. Has been followed by Dr. Raford with Cardiology.  Echo 1/22: EF 30-35%  Echo 1/23: EF 25-30%, RV moderately reduced, RVSP 38.8 mmHg, trivial MR, mean gradient 7 mmHg across mitral valve prosthesis, IVC dilated with estimated RAP 15 mmHg  Saw EP in 3/23. D/t presence of progressive conduction system disease workup for amyloidosis recommended. Unable to tolerate cMRI d/t claustrophobia.   7 day Zio 4/23: SR with avg rate 83 bpm, 1st degree AVB and intermittent Wenckebach AV block noted. 2.3% PVCs. 6 runs VT, longest 27 min and 48 seconds. ICD recommended. She wanted to discuss with family before proceeding.  Admitted 5/23 with a/c CHF. Diuresed with IV lasix . Discharged on furosemide  80 mg daily. GDMT - coreg  3.125 mg BID and dapagliflozin  10 mg daily.   Seen in [facility] clinic 04/17/22. Added back Entresto , later stopped when she developed hyperkalemia. Felt better when she was on Entresto  in the past. LifeVest placed dt/ episode sustained VT on monitor in April.  PYP scan 04/20/22 equivocal for TTR amyloidosis.  R/LHC 04/20/22: LIMA to LAD and SVG to OM patent, occluded SVG to RCA and SVG to Ramus, normal LVEDP, moderate mixed PAH, severe mitral stenosis (felt to be overestimated). Med management of CAD recommended, if has refractory angina could consider CTO RCA.  S/p Boston SCI ICD implant 7/23.   Seen in ED 09/14/22 with SOB. Given IV lasix  with improvement of symptoms.  Echo 5/24 showed EF 25-30%, RV moderately reduced, stable mitral valve  prosthesis, mild to moderate TR  EP arranged cardiac PET to evaluate for sarcoidosis. PET 10/24 showed diffuse FDG uptake, suggesting poor prep, LVEF 21%. Inconclusive study.  Admitted 1/25 with a/c HF. Diuresed with IV lasix . Echo showed EF 20-25%, RV moderately down, stable MV valve with trivial MR. GDMT titrated, Lasix  switched to torsemide . She was discharged home, weight 178 lbs.  Zio 2/25: NSR (versus atrial pacing) with 1AVB - avg HR of 83 bpm. 8 runs NSVT. 60 runs SVT, longest lasting 18.8 seconds. Occasional PVCs 3.2%  Admitted 10/25 with A/C HFrEF in the setting of med noncompliance. Diuresed well with IV lasix . Echo during admission EF <20%, LV with GHK, RV normal.  Today she returns for post hospital follow up. Overall feeling ok. Denies palpitations, CP, dizziness, or PND/Orthopnea. Has swelling in her legs intermittently. SOB with walking. Sleeps at an incline. Appetite ok but diminished, tries to watch what she eats. Drinks ~32 oz/fluid a day. No fever or chills. Weight at home 171 pounds. Taking all medications. Denies ETOH, tobacco or drug use.  Cardiac Studies - Echo 1/25: EF 20-25%, RV moderately down, stable bioprosthetic MV valve with trivial MR - Echo (5/24): EF 25-30%, RV moderately reduced, stable mitral valve prosthesis, mild to moderate TR - R/LHC (5/23): Severe 3v CAD, LIMA to LAD and SVG to OM-1 widely patent,  SVG to RCA and SVG to Ramus occluded,  LVEF 25%,  Normal LVEDP. Moderate mixed PAH. Apparent severe mitral stenosis (suspect overestimated based on echo). Ao = 126/65 (90) , LV = 114/13, RA =  10, RV =  52/9, PA = 50/18 (33), PCW = 22 (v = 30), Fick CO/CI = 4.6/2.6, PVR = 2.4 WU, FA sat 94%, PA sat = 60%, 63%, MV gradient on LV-wedge tracing = mean 13.0 MVA 0.93cm2, PAPi = 3.2 - PYP (5/23): equivocal for TTR amyloidosis (Bensimhon read: completely negative) - Zio (4/23): SR with avg rate 83 bpm, 1st degree AVB and intermittent Wenckebach AV block noted. 2.3% PVCs. 6  runs VT, longest 27 min and 48 seconds.  - Echo (1/23): EF 25-30%, RV moderately reduced, RVSP 38.8 mmHg, trivial MR, mean gradient 7 mmHg across mitral valve prosthesis, IVC dilated with estimated RAP 15 mmHg - Echo (1/22): EF 30-35% - Echo (6/20): EF 35-40%.   Review of Systems: Cardiac and respiratory. Negative except as mentioned in HPI.  Past Medical History:  Diagnosis Date   Arrhythmia    Hx PAF, PVC's   CHF (congestive heart failure) (HCC)    Coronary artery disease    04/04/2017 STEMI   Diabetes [facility])    H/O mitral valve replacement 09/21/2021   Heart attack (HCC)    per pt reprot   Hyperlipidemia    Hypertension    New onset atrial fibrillation (HCC) 05/2017   Current Outpatient Medications  Medication Sig Dispense Refill   acetaminophen  (TYLENOL ) 325 MG tablet Take 2 tablets (650 mg total) by mouth every 6 (six) hours as needed for mild pain or headache.     aspirin  EC 81 MG tablet Take 81 mg by mouth daily.     atorvastatin  (LIPITOR ) 40 MG tablet TAKE 1 TABLET BY MOUTH EVERY DAY 90 tablet 2   dapagliflozin  propanediol (FARXIGA ) 10 MG TABS tablet Take by mouth daily.     ENTRESTO  24-26 MG TAKE 1 TABLET BY MOUTH TWICE A DAY 60 tablet 3   glipiZIDE  (GLUCOTROL  XL) 10 MG 24 hr tablet Take 1 tablet (10 mg total) by mouth daily with breakfast.     hydrALAZINE  (APRESOLINE ) 25 MG tablet Take 25 mg by mouth 2 (two) times daily.     isosorbide  mononitrate (IMDUR ) 30 MG 24 hr tablet Take 1 tablet (30 mg total) by mouth daily. 90 tablet 1   metFORMIN  (GLUCOPHAGE ) 1000 MG tablet Take 1,000 mg by mouth 2 (two) times daily with a meal.     RYBELSUS 14 MG TABS Take 1 tablet by mouth every morning.     torsemide  (DEMADEX ) 20 MG tablet Take 2 tablets (40 mg total) by mouth daily. 60 tablet 0   warfarin (COUMADIN ) 5 MG tablet TAKE 1 TABLET BY MOUTH DAILY OR AS DIRECTED BY COUMADIN  CLINIC 90 tablet 1   carvedilol  (COREG ) 12.5 MG tablet Take 1 tablet (12.5 mg total) by mouth 2 (two) times  daily. (Patient not taking: Reported on 10/02/2024) 180 tablet 3   empagliflozin (JARDIANCE) 10 MG TABS tablet Take 1 tablet (10 mg total) by mouth daily. (Patient not taking: Reported on 10/02/2024) 30 tablet 0   No current facility-administered medications for this encounter.   Allergies  Allergen Reactions   Keflex  [Cephalexin ] Rash    Skin on feet peeled.   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  Tobacco Use   Smoking status: Never   Smokeless tobacco: Never  Vaping Use   Vaping status: Never Used  Substance and Sexual Activity   Alcohol use: No   Drug use: Yes    Types: Marijuana    Comment: In her 21 's   Sexual activity: Not on file  Other Topics Concern   Not on file  Social History Narrative   Not on file   Social Drivers of Health   Financial Resource Strain: Not on file  Food Insecurity: No Food Insecurity (09/16/2024)   Hunger Vital Sign    Worried About Running Out of Food in the Last Year: Never true    Ran Out of Food in the Last Year: Never true  Transportation Needs: No Transportation Needs (09/16/2024)   PRAPARE - Administrator, Civil Service (Medical): No    Lack of Transportation (Non-Medical): No  Physical Activity: Not on file  Stress: Not on file  Social Connections: Moderately Isolated (09/16/2024)   Social Connection and Isolation Panel    Frequency of Communication with Friends and Family: More than three times a week    Frequency of Social Gatherings with Friends and Family: Three times a week    Attends Religious Services: Never    Active Member of Clubs or Organizations:  No    Attends Banker Meetings: Never    Marital Status: Married  Catering Manager Violence: Not At Risk (09/16/2024)   Humiliation, Afraid, Rape, and Kick questionnaire    Fear of Current or Ex-Partner: No    Emotionally Abused: No    Physically Abused: No    Sexually Abused: No   Family History  Problem Relation Age of Onset   Heart attack Father 26   Breast cancer Maternal Grandmother    BP 118/68   Pulse 100   Ht 5' 2 (1.575 m)   Wt 78.6 kg (173 lb 3.2 oz)   SpO2 98%   BMI 31.68 kg/m   Wt Readings from Last 3 Encounters:  10/02/24 78.6 kg (173 lb 3.2 oz)  09/22/24 77.6 kg (171 lb)  09/20/24 76.4 kg (168 lb 6.9 oz)   PHYSICAL EXAM: General:  well appearing.  No respiratory difficulty. Walked into clinic Neck: JVD ~6 cm.  Cor: Regular rate & rhythm. No murmurs. Lungs: clear Extremities: no edema  Neuro: alert & oriented x 3. Affect pleasant.   ECG none today  BSCi ICD interrogation (personally reviewed): HL score 2, impedance up, S3 stable, activity level 0.5 hr/day, average HR 70 bpm.   ASSESSMENT & PLAN: Chronic Systolic Heart Failure/ICM - Echo (02/06/2018): EF 35-40% - Echo (6/20): EF 35-40% - Echo (1/22): EF 30-35% - Echo (1/23): EF 25-30% (appears closer to 20% on review with Dr. Cherrie) - Sustained VT on zio 04/23. Wore LifeVest while deciding on ICD. - R/LHC (04/20/22): LIMA to LAD and SVG to OM patent, occluded SVG to RCA and SVG to Ramus, normal LVEDP, moderate mixed PAH, severe mitral stenosis (felt to be overestimated). Med management of CAD recommended, if has refractory angina could consider CTO RCA. - Unable to tolerate MRI d/t claustrophobia.  - PYP scan (5/23): negative. - s/p [employer] SCI ICD 7/23 - Echo (5/24): EF 25-30%, RV moderately reduced, stable mitral valve prosthesis, mild to moderate TR - Echo 1/25: EF 20-25%, RV moderately down, stable bioprosthetic MV valve with trivial MR -  Echo 10/25: EF <20% - NYHA II. Volume ok on  exam and by device.  - Continue torsemide  40 mg daily.  - Continue Coreg  12.5 mg bid. Follow carefully with baseline conduction system disease.  - Continue Farxiga  10 mg daily. (Discharged with Bernadine but was told it needed a PA. Discussed with PharmD, Farxiga  with 0$ copay, will continue Farxiga . Had been taking this anyway's as this is what she had at home) - Continue Entresto  24/26 mg bid. - Continue hydralazine  25 mg bid + Imdur  30 mg daily. - Off spiro d/t hyperkalemia.  - Labs today - Encouraged to wear compression socks with sedentary job.    2. CAD S/P CABG x4: - LHC 04/20/22: LIMA to LAD and SVG to OM patent, occluded SVG to RCA and SVG to Ramus - Managing CAD medically as above. - No recent angina. - Continue statin and ASA.  3. Bioprosthetic MVR:  - Echo 1/23: EF 25-30%, trivial MR, mild mitral stenosis with mean gradient 7 mmHg  - RHC 5/23: MV gradient on LV wedge tracing - mean 13, MVA 0.93 cm2, suggestive of severe MS (likely overestimated when compared to echo) - Echo (5/24) with mean gradient 8 mmHg, no mitral stenosis - Echo 1/25 with mean gradient 8 mmHg, trivial MR - Continue warfarin and ASA. Denies bleeding. Will check INR today and forward to coumadin  clinic.  - Aware of SBE prophylaxis   4. VT/PVCs:  - 7 day Zio, 04/23: 2.3% PVCs, sustained VT (longest 27 minutes) - s/p ICD implant 06/30/22.  - Device interrogation as above - Zio 2/25 with 3.2% PVCs  5. PAF - Continue warfarin. Denies bleeding - INR followed by Coumadin  Clinic - Zio 2/25: NSR (versus atrial pacing) with 1AVB - avg HR of 83 bpm. 8 runs NSVT. 60 runs SVT, longest lasting 18.8 seconds. Occasional PVCs 3.2%  6. DM2 - Most recent A1C 7.9 - She is not on insulin  - Per PCP  7. CKD 3 - Baseline SCr 1.4-1.6 - Continue Farxiga   - BMET today.  8. Goiter - Thyroid  nodule biopsied 10/20, benign follicular nodule - TSH 0.436 and free T4 1.10 (4/24) - TSH 1.041 10/25 - no longer follows with  Endo, PCP manages.    Follow up in 3-4 months with Dr. Cherrie Beckey LITTIE Hayes, NP  2:29 PM

## 2024-10-02 ENCOUNTER — Ambulatory Visit (INDEPENDENT_AMBULATORY_CARE_PROVIDER_SITE_OTHER): Payer: Self-pay | Admitting: Student in an Organized Health Care Education/Training Program

## 2024-10-02 ENCOUNTER — Ambulatory Visit (HOSPITAL_COMMUNITY): Payer: Self-pay | Admitting: Internal Medicine

## 2024-10-02 ENCOUNTER — Encounter (HOSPITAL_COMMUNITY): Payer: Self-pay

## 2024-10-02 ENCOUNTER — Ambulatory Visit (HOSPITAL_COMMUNITY)
Admission: RE | Admit: 2024-10-02 | Discharge: 2024-10-02 | Disposition: A | Discharge status: Home / Self Care | Source: Ambulatory Visit | Attending: Internal Medicine | Admitting: Internal Medicine

## 2024-10-02 ENCOUNTER — Other Ambulatory Visit (HOSPITAL_COMMUNITY): Payer: Self-pay

## 2024-10-02 VITALS — BP 118/68 | HR 100 | Ht 62.0 in | Wt 173.2 lb

## 2024-10-02 DIAGNOSIS — I5022 Chronic systolic (congestive) heart failure: Secondary | ICD-10-CM | POA: Diagnosis present

## 2024-10-02 DIAGNOSIS — Z8249 Family history of ischemic heart disease and other diseases of the circulatory system: Secondary | ICD-10-CM | POA: Insufficient documentation

## 2024-10-02 DIAGNOSIS — I2510 Atherosclerotic heart disease of native coronary artery without angina pectoris: Secondary | ICD-10-CM | POA: Diagnosis not present

## 2024-10-02 DIAGNOSIS — Z951 Presence of aortocoronary bypass graft: Secondary | ICD-10-CM | POA: Diagnosis not present

## 2024-10-02 DIAGNOSIS — E1122 Type 2 diabetes mellitus with diabetic chronic kidney disease: Secondary | ICD-10-CM | POA: Diagnosis not present

## 2024-10-02 DIAGNOSIS — N183 Chronic kidney disease, stage 3 unspecified: Secondary | ICD-10-CM | POA: Insufficient documentation

## 2024-10-02 DIAGNOSIS — Z7982 Long term (current) use of aspirin: Secondary | ICD-10-CM | POA: Insufficient documentation

## 2024-10-02 DIAGNOSIS — E119 Type 2 diabetes mellitus without complications: Secondary | ICD-10-CM

## 2024-10-02 DIAGNOSIS — Z7901 Long term (current) use of anticoagulants: Secondary | ICD-10-CM

## 2024-10-02 DIAGNOSIS — Z79899 Other long term (current) drug therapy: Secondary | ICD-10-CM | POA: Diagnosis not present

## 2024-10-02 DIAGNOSIS — I493 Ventricular premature depolarization: Secondary | ICD-10-CM | POA: Insufficient documentation

## 2024-10-02 DIAGNOSIS — Z95810 Presence of automatic (implantable) cardiac defibrillator: Secondary | ICD-10-CM | POA: Diagnosis not present

## 2024-10-02 DIAGNOSIS — E041 Nontoxic single thyroid nodule: Secondary | ICD-10-CM | POA: Insufficient documentation

## 2024-10-02 DIAGNOSIS — I471 Supraventricular tachycardia, unspecified: Secondary | ICD-10-CM | POA: Insufficient documentation

## 2024-10-02 DIAGNOSIS — Z5181 Encounter for therapeutic drug level monitoring: Secondary | ICD-10-CM | POA: Diagnosis not present

## 2024-10-02 DIAGNOSIS — I472 Ventricular tachycardia, unspecified: Secondary | ICD-10-CM | POA: Insufficient documentation

## 2024-10-02 DIAGNOSIS — Z952 Presence of prosthetic heart valve: Secondary | ICD-10-CM | POA: Diagnosis not present

## 2024-10-02 DIAGNOSIS — I480 Paroxysmal atrial fibrillation: Secondary | ICD-10-CM | POA: Insufficient documentation

## 2024-10-02 DIAGNOSIS — I130 Hypertensive heart and chronic kidney disease with heart failure and stage 1 through stage 4 chronic kidney disease, or unspecified chronic kidney disease: Secondary | ICD-10-CM | POA: Diagnosis not present

## 2024-10-02 DIAGNOSIS — Z953 Presence of xenogenic heart valve: Secondary | ICD-10-CM | POA: Diagnosis not present

## 2024-10-02 LAB — CUP PACEART REMOTE DEVICE CHECK
Battery Remaining Longevity: 156 mo
Battery Remaining Percentage: 100 %
Brady Statistic RV Percent Paced: 1 %
Date Time Interrogation Session: 20251029020100
HighPow Impedance: 60 Ohm
Implantable Lead Connection Status: 753985
Implantable Lead Implant Date: 20230728
Implantable Lead Location: 753860
Implantable Lead Model: 138
Implantable Lead Serial Number: 303552
Implantable Pulse Generator Implant Date: 20230728
Lead Channel Impedance Value: 439 Ohm
Lead Channel Pacing Threshold Amplitude: 0.7 V
Lead Channel Pacing Threshold Pulse Width: 0.4 ms
Lead Channel Setting Pacing Amplitude: 2.5 V
Lead Channel Setting Pacing Pulse Width: 0.4 ms
Lead Channel Setting Sensing Sensitivity: 0.6 mV
Pulse Gen Serial Number: 217674

## 2024-10-02 LAB — BASIC METABOLIC PANEL WITH GFR
Anion gap: 10 (ref 5–15)
BUN: 26 mg/dL — ABNORMAL HIGH (ref 8–23)
CO2: 27 mmol/L (ref 22–32)
Calcium: 9.2 mg/dL (ref 8.9–10.3)
Chloride: 105 mmol/L (ref 98–111)
Creatinine, Ser: 1.76 mg/dL — ABNORMAL HIGH (ref 0.44–1.00)
GFR, Estimated: 31 mL/min — ABNORMAL LOW (ref >60)
Glucose, Bld: 163 mg/dL — ABNORMAL HIGH (ref 70–99)
Potassium: 4.3 mmol/L (ref 3.5–5.1)
Sodium: 142 mmol/L (ref 135–145)

## 2024-10-02 LAB — PROTIME-INR
INR: 2.7 — ABNORMAL HIGH (ref 0.8–1.2)
Prothrombin Time: 30.2 s — ABNORMAL HIGH (ref 11.4–15.2)

## 2024-10-02 LAB — BRAIN NATRIURETIC PEPTIDE: B Natriuretic Peptide: 872.4 pg/mL — ABNORMAL HIGH (ref 0.0–100.0)

## 2024-10-02 MED ORDER — DAPAGLIFLOZIN PROPANEDIOL 10 MG PO TABS: 10.0000 mg | ORAL_TABLET | Freq: Every day | ORAL | 5 refills | Status: AC

## 2024-10-02 NOTE — Patient Instructions (Signed)
 Labs done today. We will contact you only if your labs are abnormal.  No other medication changes were made. Please continue all current medications as prescribed.  Your physician recommends that you schedule a follow-up appointment in: 4 months with Dr. Benismhon. Please contact our office in January 2026 to schedule a February 2026 appointment.   If you have any questions or concerns before your next appointment please send us  a message through [place] or call our office at [phone].    TO LEAVE A MESSAGE FOR THE NURSE SELECT OPTION 2, PLEASE LEAVE A MESSAGE INCLUDING: YOUR NAME DATE OF BIRTH CALL BACK NUMBER REASON FOR CALL**this is important as we prioritize the call backs  YOU WILL RECEIVE A CALL BACK THE SAME DAY AS LONG AS YOU CALL BEFORE 4:00 PM   Do the following things EVERYDAY: Weigh yourself in the morning before breakfast. Write it down and keep it in a log. Take your medicines as prescribed Eat low salt foods--Limit salt (sodium) to 2000 mg per day.  Stay as active as you can everyday Limit all fluids for the day to less than 2 liters   At the Advanced Heart Failure Clinic, you and your health needs are our priority. As part of our continuing mission to provide you with exceptional heart care, we have created designated Provider Care Teams. These Care Teams include your primary Cardiologist (physician) and Advanced Practice Providers (APPs- Physician Assistants and Nurse Practitioners) who all work together to provide you with the care you need, when you need it.   You may see any of the following providers on your designated Care Team at your next follow up: Dr Toribio Fuel Dr Ezra Rolan Greig Lenetta, NP Caffie Shed, PA [facility] [place], [place] Beckey Coe, NP Tinnie Redman, PharmD   Please be sure to bring in all your medications bottles to every appointment.    Thank you for choosing [facility]

## 2024-10-03 ENCOUNTER — Ambulatory Visit: Payer: Self-pay | Admitting: Cardiology

## 2024-10-03 NOTE — Progress Notes (Signed)
 INR 2.7 Please see anticoagulation encounter Continue taking warfarin 1 tablet (5mg ) daily.  Recheck INR in 6 weeks.  Coumadin  Clinic  [phone]

## 2024-10-08 NOTE — Progress Notes (Signed)
 Remote ICD Transmission

## 2024-10-14 ENCOUNTER — Other Ambulatory Visit (HOSPITAL_COMMUNITY): Payer: Self-pay

## 2024-10-14 ENCOUNTER — Other Ambulatory Visit (HOSPITAL_COMMUNITY): Payer: Self-pay | Admitting: Cardiology

## 2024-10-14 MED ORDER — ENTRESTO 24-26 MG PO TABS: 1.0000 | ORAL_TABLET | Freq: Two times a day (BID) | ORAL | 3 refills | Status: DC

## 2024-11-13 ENCOUNTER — Ambulatory Visit: Attending: Cardiovascular Disease | Admitting: *Deleted

## 2024-11-13 DIAGNOSIS — Z5181 Encounter for therapeutic drug level monitoring: Secondary | ICD-10-CM

## 2024-11-13 DIAGNOSIS — Z7901 Long term (current) use of anticoagulants: Secondary | ICD-10-CM | POA: Diagnosis not present

## 2024-11-13 DIAGNOSIS — Z951 Presence of aortocoronary bypass graft: Secondary | ICD-10-CM

## 2024-11-13 LAB — POCT INR: POC INR: 3.2

## 2024-11-13 NOTE — Progress Notes (Signed)
 Lab Results  Component Value Date   INR 3.2 11/13/2024   INR 2.7 (H) 10/02/2024   INR 2.3 (H) 09/20/2024    Description   INR 3.2; Hold warfarin today and then continue taking warfarin 1 tablet (5mg ) daily. Recheck INR in 4 weeks.  Coumadin  Clinic  [phone]

## 2024-11-13 NOTE — Patient Instructions (Signed)
 Description   INR 3.2; Hold warfarin today and then continue taking warfarin 1 tablet (5mg ) daily. Recheck INR in 4 weeks.  Coumadin  Clinic  [phone]

## 2024-11-14 ENCOUNTER — Ambulatory Visit

## 2024-11-16 ENCOUNTER — Other Ambulatory Visit (HOSPITAL_COMMUNITY): Payer: Self-pay | Admitting: Internal Medicine

## 2024-11-20 ENCOUNTER — Encounter (HOSPITAL_COMMUNITY): Payer: Self-pay | Admitting: *Deleted

## 2024-11-20 NOTE — Progress Notes (Signed)
 FMLA form to cover pt's hospitalization in Oct completed, signed by Dr Cherrie and faxed in to [facility] at [phone], mychart mess sent to pt this was done

## 2024-12-05 ENCOUNTER — Encounter (HOSPITAL_COMMUNITY): Payer: Self-pay | Admitting: Emergency Medicine

## 2024-12-05 ENCOUNTER — Inpatient Hospital Stay (HOSPITAL_COMMUNITY)
Admission: EM | Admit: 2024-12-05 | Discharge: 2024-12-11 | DRG: 871 | Disposition: A | Discharge status: Home Health | Attending: Internal Medicine | Admitting: Internal Medicine

## 2024-12-05 ENCOUNTER — Other Ambulatory Visit: Payer: Self-pay

## 2024-12-05 ENCOUNTER — Emergency Department (HOSPITAL_COMMUNITY)

## 2024-12-05 DIAGNOSIS — R6520 Severe sepsis without septic shock: Secondary | ICD-10-CM | POA: Diagnosis present

## 2024-12-05 DIAGNOSIS — Z79899 Other long term (current) drug therapy: Secondary | ICD-10-CM

## 2024-12-05 DIAGNOSIS — E66811 Obesity, class 1: Secondary | ICD-10-CM | POA: Diagnosis present

## 2024-12-05 DIAGNOSIS — I252 Old myocardial infarction: Secondary | ICD-10-CM

## 2024-12-05 DIAGNOSIS — N179 Acute kidney failure, unspecified: Secondary | ICD-10-CM | POA: Diagnosis present

## 2024-12-05 DIAGNOSIS — E871 Hypo-osmolality and hyponatremia: Secondary | ICD-10-CM | POA: Diagnosis present

## 2024-12-05 DIAGNOSIS — Z803 Family history of malignant neoplasm of breast: Secondary | ICD-10-CM

## 2024-12-05 DIAGNOSIS — A4189 Other specified sepsis: Principal | ICD-10-CM | POA: Diagnosis present

## 2024-12-05 DIAGNOSIS — Z7901 Long term (current) use of anticoagulants: Secondary | ICD-10-CM | POA: Diagnosis not present

## 2024-12-05 DIAGNOSIS — E875 Hyperkalemia: Secondary | ICD-10-CM | POA: Diagnosis present

## 2024-12-05 DIAGNOSIS — I5022 Chronic systolic (congestive) heart failure: Secondary | ICD-10-CM | POA: Diagnosis not present

## 2024-12-05 DIAGNOSIS — J1100 Influenza due to unidentified influenza virus with unspecified type of pneumonia: Secondary | ICD-10-CM | POA: Diagnosis not present

## 2024-12-05 DIAGNOSIS — I480 Paroxysmal atrial fibrillation: Secondary | ICD-10-CM | POA: Diagnosis present

## 2024-12-05 DIAGNOSIS — Z888 Allergy status to other drugs, medicaments and biological substances status: Secondary | ICD-10-CM

## 2024-12-05 DIAGNOSIS — B953 Streptococcus pneumoniae as the cause of diseases classified elsewhere: Secondary | ICD-10-CM | POA: Diagnosis present

## 2024-12-05 DIAGNOSIS — E785 Hyperlipidemia, unspecified: Secondary | ICD-10-CM | POA: Diagnosis present

## 2024-12-05 DIAGNOSIS — I472 Ventricular tachycardia, unspecified: Secondary | ICD-10-CM | POA: Diagnosis present

## 2024-12-05 DIAGNOSIS — Z953 Presence of xenogenic heart valve: Secondary | ICD-10-CM | POA: Diagnosis not present

## 2024-12-05 DIAGNOSIS — E872 Acidosis, unspecified: Secondary | ICD-10-CM | POA: Diagnosis present

## 2024-12-05 DIAGNOSIS — Z881 Allergy status to other antibiotic agents status: Secondary | ICD-10-CM

## 2024-12-05 DIAGNOSIS — I5023 Acute on chronic systolic (congestive) heart failure: Secondary | ICD-10-CM | POA: Diagnosis present

## 2024-12-05 DIAGNOSIS — I4510 Unspecified right bundle-branch block: Secondary | ICD-10-CM | POA: Diagnosis present

## 2024-12-05 DIAGNOSIS — I2510 Atherosclerotic heart disease of native coronary artery without angina pectoris: Secondary | ICD-10-CM | POA: Diagnosis present

## 2024-12-05 DIAGNOSIS — E1122 Type 2 diabetes mellitus with diabetic chronic kidney disease: Secondary | ICD-10-CM | POA: Diagnosis present

## 2024-12-05 DIAGNOSIS — Z95810 Presence of automatic (implantable) cardiac defibrillator: Secondary | ICD-10-CM

## 2024-12-05 DIAGNOSIS — I110 Hypertensive heart disease with heart failure: Secondary | ICD-10-CM | POA: Diagnosis not present

## 2024-12-05 DIAGNOSIS — Z886 Allergy status to analgesic agent status: Secondary | ICD-10-CM

## 2024-12-05 DIAGNOSIS — Z7982 Long term (current) use of aspirin: Secondary | ICD-10-CM

## 2024-12-05 DIAGNOSIS — I38 Endocarditis, valve unspecified: Secondary | ICD-10-CM | POA: Diagnosis not present

## 2024-12-05 DIAGNOSIS — B955 Unspecified streptococcus as the cause of diseases classified elsewhere: Secondary | ICD-10-CM | POA: Diagnosis not present

## 2024-12-05 DIAGNOSIS — J09X1 Influenza due to identified novel influenza A virus with pneumonia: Secondary | ICD-10-CM

## 2024-12-05 DIAGNOSIS — I509 Heart failure, unspecified: Secondary | ICD-10-CM

## 2024-12-05 DIAGNOSIS — N189 Chronic kidney disease, unspecified: Secondary | ICD-10-CM | POA: Diagnosis not present

## 2024-12-05 DIAGNOSIS — I5021 Acute systolic (congestive) heart failure: Secondary | ICD-10-CM | POA: Diagnosis not present

## 2024-12-05 DIAGNOSIS — I330 Acute and subacute infective endocarditis: Secondary | ICD-10-CM | POA: Diagnosis present

## 2024-12-05 DIAGNOSIS — J9601 Acute respiratory failure with hypoxia: Secondary | ICD-10-CM | POA: Diagnosis present

## 2024-12-05 DIAGNOSIS — E1165 Type 2 diabetes mellitus with hyperglycemia: Secondary | ICD-10-CM | POA: Diagnosis present

## 2024-12-05 DIAGNOSIS — Z91148 Patient's other noncompliance with medication regimen for other reason: Secondary | ICD-10-CM

## 2024-12-05 DIAGNOSIS — A419 Sepsis, unspecified organism: Secondary | ICD-10-CM | POA: Diagnosis not present

## 2024-12-05 DIAGNOSIS — Z951 Presence of aortocoronary bypass graft: Secondary | ICD-10-CM

## 2024-12-05 DIAGNOSIS — I4891 Unspecified atrial fibrillation: Secondary | ICD-10-CM | POA: Diagnosis not present

## 2024-12-05 DIAGNOSIS — I130 Hypertensive heart and chronic kidney disease with heart failure and stage 1 through stage 4 chronic kidney disease, or unspecified chronic kidney disease: Secondary | ICD-10-CM | POA: Diagnosis present

## 2024-12-05 DIAGNOSIS — E1169 Type 2 diabetes mellitus with other specified complication: Secondary | ICD-10-CM

## 2024-12-05 DIAGNOSIS — Z6833 Body mass index (BMI) 33.0-33.9, adult: Secondary | ICD-10-CM

## 2024-12-05 DIAGNOSIS — J189 Pneumonia, unspecified organism: Principal | ICD-10-CM

## 2024-12-05 DIAGNOSIS — J154 Pneumonia due to other streptococci: Secondary | ICD-10-CM | POA: Diagnosis present

## 2024-12-05 DIAGNOSIS — I493 Ventricular premature depolarization: Secondary | ICD-10-CM | POA: Diagnosis present

## 2024-12-05 DIAGNOSIS — R5381 Other malaise: Secondary | ICD-10-CM | POA: Diagnosis present

## 2024-12-05 DIAGNOSIS — I471 Supraventricular tachycardia, unspecified: Secondary | ICD-10-CM | POA: Diagnosis present

## 2024-12-05 DIAGNOSIS — R791 Abnormal coagulation profile: Secondary | ICD-10-CM | POA: Diagnosis present

## 2024-12-05 DIAGNOSIS — Z8249 Family history of ischemic heart disease and other diseases of the circulatory system: Secondary | ICD-10-CM

## 2024-12-05 DIAGNOSIS — N1831 Chronic kidney disease, stage 3a: Secondary | ICD-10-CM | POA: Diagnosis present

## 2024-12-05 DIAGNOSIS — J1008 Influenza due to other identified influenza virus with other specified pneumonia: Secondary | ICD-10-CM | POA: Diagnosis present

## 2024-12-05 DIAGNOSIS — Z7984 Long term (current) use of oral hypoglycemic drugs: Secondary | ICD-10-CM

## 2024-12-05 DIAGNOSIS — R7881 Bacteremia: Secondary | ICD-10-CM | POA: Diagnosis not present

## 2024-12-05 DIAGNOSIS — I440 Atrioventricular block, first degree: Secondary | ICD-10-CM | POA: Diagnosis present

## 2024-12-05 LAB — BLOOD CULTURE ID PANEL (REFLEXED) - BCID2

## 2024-12-05 LAB — I-STAT CG4 LACTIC ACID, ED
Lactic Acid, Venous: 2.5 mmol/L (ref 0.5–1.9)
Lactic Acid, Venous: 2.5 mmol/L (ref 0.5–1.9)

## 2024-12-05 LAB — RESP PANEL BY RT-PCR (RSV, FLU A&B, COVID)  RVPGX2
Influenza A by PCR: POSITIVE — AB
Influenza B by PCR: NEGATIVE
Resp Syncytial Virus by PCR: NEGATIVE
SARS Coronavirus 2 by RT PCR: NEGATIVE

## 2024-12-05 LAB — CBC
HCT: 43.7 % (ref 36.0–46.0)
Hemoglobin: 13.5 g/dL (ref 12.0–15.0)
MCH: 23 pg — ABNORMAL LOW (ref 26.0–34.0)
MCHC: 30.9 g/dL (ref 30.0–36.0)
MCV: 74.4 fL — ABNORMAL LOW (ref 80.0–100.0)
Platelets: 173 K/uL (ref 150–400)
RBC: 5.87 MIL/uL — ABNORMAL HIGH (ref 3.87–5.11)
RDW: 20.2 % — ABNORMAL HIGH (ref 11.5–15.5)
WBC: 18.4 K/uL — ABNORMAL HIGH (ref 4.0–10.5)
nRBC: 0 % (ref 0.0–0.2)

## 2024-12-05 LAB — BASIC METABOLIC PANEL WITH GFR
Anion gap: 15 (ref 5–15)
BUN: 21 mg/dL (ref 8–23)
CO2: 19 mmol/L — ABNORMAL LOW (ref 22–32)
Calcium: 9.3 mg/dL (ref 8.9–10.3)
Chloride: 99 mmol/L (ref 98–111)
Creatinine, Ser: 1.3 mg/dL — ABNORMAL HIGH (ref 0.44–1.00)
GFR, Estimated: 44 mL/min — ABNORMAL LOW
Glucose, Bld: 342 mg/dL — ABNORMAL HIGH (ref 70–99)
Potassium: 5.1 mmol/L (ref 3.5–5.1)
Sodium: 134 mmol/L — ABNORMAL LOW (ref 135–145)

## 2024-12-05 LAB — GLUCOSE, CAPILLARY
Glucose-Capillary: 269 mg/dL — ABNORMAL HIGH (ref 70–99)
Glucose-Capillary: 117 mg/dL — ABNORMAL HIGH (ref 70–99)

## 2024-12-05 LAB — CBG MONITORING, ED
Glucose-Capillary: 326 mg/dL — ABNORMAL HIGH (ref 70–99)
Glucose-Capillary: 298 mg/dL — ABNORMAL HIGH (ref 70–99)

## 2024-12-05 LAB — PROTIME-INR
INR: 2.2 — ABNORMAL HIGH (ref 0.8–1.2)
Prothrombin Time: 25.6 s — ABNORMAL HIGH (ref 11.4–15.2)

## 2024-12-05 LAB — PRO BRAIN NATRIURETIC PEPTIDE: Pro Brain Natriuretic Peptide: 29984 pg/mL — ABNORMAL HIGH

## 2024-12-05 MED ORDER — CARVEDILOL 12.5 MG PO TABS
12.5000 mg | ORAL_TABLET | Freq: Two times a day (BID) | ORAL | Status: DC
  Administered 2024-12-05 – 2024-12-11 (×13): 12.5 mg via ORAL
  Filled 2024-12-05 (×13): qty 1

## 2024-12-05 MED ORDER — ONDANSETRON HCL 4 MG PO TABS: 4.0000 mg | ORAL_TABLET | Freq: Four times a day (QID) | ORAL | Status: DC | PRN

## 2024-12-05 MED ORDER — FUROSEMIDE 10 MG/ML IJ SOLN: 60.0000 mg | Freq: Two times a day (BID) | INTRAMUSCULAR | Status: DC

## 2024-12-05 MED ORDER — TRAZODONE HCL 50 MG PO TABS
25.0000 mg | ORAL_TABLET | Freq: Every evening | ORAL | Status: DC | PRN
  Filled 2024-12-05 (×2): qty 1

## 2024-12-05 MED ORDER — INSULIN ASPART 100 UNIT/ML IJ SOLN
0.0000 [IU] | Freq: Every day | INTRAMUSCULAR | Status: DC
  Administered 2024-12-06: 3 [IU] via SUBCUTANEOUS
  Filled 2024-12-05: qty 3

## 2024-12-05 MED ORDER — SACUBITRIL-VALSARTAN 24-26 MG PO TABS
1.0000 | ORAL_TABLET | Freq: Two times a day (BID) | ORAL | Status: DC
  Administered 2024-12-05 – 2024-12-11 (×13): 1 via ORAL
  Filled 2024-12-05 (×15): qty 1

## 2024-12-05 MED ORDER — ISOSORBIDE MONONITRATE ER 30 MG PO TB24
30.0000 mg | ORAL_TABLET | Freq: Every day | ORAL | Status: DC
  Administered 2024-12-07: 30 mg via ORAL
  Filled 2024-12-05 (×3): qty 1

## 2024-12-05 MED ORDER — ALBUTEROL SULFATE HFA 108 (90 BASE) MCG/ACT IN AERS
1.0000 | INHALATION_SPRAY | RESPIRATORY_TRACT | Status: DC
  Administered 2024-12-05 (×3): 2 via RESPIRATORY_TRACT
  Filled 2024-12-05 (×2): qty 6.7

## 2024-12-05 MED ORDER — WARFARIN SODIUM 5 MG PO TABS
5.0000 mg | ORAL_TABLET | Freq: Once | ORAL | Status: AC
  Administered 2024-12-05: 5 mg via ORAL
  Filled 2024-12-05 (×2): qty 1

## 2024-12-05 MED ORDER — SODIUM CHLORIDE 0.9 % IV SOLN
1.0000 g | Freq: Once | INTRAVENOUS | Status: AC
  Administered 2024-12-05: 1 g via INTRAVENOUS
  Filled 2024-12-05: qty 10

## 2024-12-05 MED ORDER — ASPIRIN 81 MG PO TBEC
81.0000 mg | DELAYED_RELEASE_TABLET | Freq: Every day | ORAL | Status: DC
  Administered 2024-12-05 – 2024-12-11 (×7): 81 mg via ORAL
  Filled 2024-12-05 (×7): qty 1

## 2024-12-05 MED ORDER — ALBUTEROL SULFATE (2.5 MG/3ML) 0.083% IN NEBU: 2.5000 mg | INHALATION_SOLUTION | RESPIRATORY_TRACT | Status: DC | PRN

## 2024-12-05 MED ORDER — DAPAGLIFLOZIN PROPANEDIOL 10 MG PO TABS
10.0000 mg | ORAL_TABLET | Freq: Every day | ORAL | Status: DC
  Administered 2024-12-06: 10 mg via ORAL
  Filled 2024-12-05 (×2): qty 1

## 2024-12-05 MED ORDER — OSELTAMIVIR PHOSPHATE 75 MG PO CAPS: 75.0000 mg | ORAL_CAPSULE | Freq: Once | ORAL | Status: DC

## 2024-12-05 MED ORDER — FUROSEMIDE 10 MG/ML IJ SOLN
60.0000 mg | Freq: Two times a day (BID) | INTRAMUSCULAR | Status: DC
  Administered 2024-12-05 – 2024-12-06 (×2): 60 mg via INTRAVENOUS
  Filled 2024-12-05 (×2): qty 6

## 2024-12-05 MED ORDER — FUROSEMIDE 10 MG/ML IJ SOLN
40.0000 mg | Freq: Once | INTRAMUSCULAR | Status: AC
  Administered 2024-12-05: 40 mg via INTRAVENOUS
  Filled 2024-12-05: qty 4

## 2024-12-05 MED ORDER — ALBUTEROL SULFATE (2.5 MG/3ML) 0.083% IN NEBU
2.5000 mg | INHALATION_SOLUTION | RESPIRATORY_TRACT | Status: DC
  Administered 2024-12-05 – 2024-12-06 (×8): 2.5 mg via RESPIRATORY_TRACT
  Filled 2024-12-05 (×8): qty 3

## 2024-12-05 MED ORDER — ACETAMINOPHEN 325 MG PO TABS
650.0000 mg | ORAL_TABLET | Freq: Four times a day (QID) | ORAL | Status: DC | PRN
  Administered 2024-12-05: 650 mg via ORAL
  Filled 2024-12-05: qty 2

## 2024-12-05 MED ORDER — OSELTAMIVIR PHOSPHATE 30 MG PO CAPS
30.0000 mg | ORAL_CAPSULE | Freq: Once | ORAL | Status: AC
  Administered 2024-12-05: 30 mg via ORAL
  Filled 2024-12-05: qty 1

## 2024-12-05 MED ORDER — SODIUM CHLORIDE 0.9 % IV SOLN
500.0000 mg | Freq: Once | INTRAVENOUS | Status: AC
  Administered 2024-12-05: 500 mg via INTRAVENOUS
  Filled 2024-12-05: qty 5

## 2024-12-05 MED ORDER — INSULIN ASPART 100 UNIT/ML IJ SOLN
0.0000 [IU] | Freq: Three times a day (TID) | INTRAMUSCULAR | Status: DC
  Administered 2024-12-05: 11 [IU] via SUBCUTANEOUS
  Administered 2024-12-05 (×2): 8 [IU] via SUBCUTANEOUS
  Administered 2024-12-06 (×2): 3 [IU] via SUBCUTANEOUS
  Administered 2024-12-07 (×2): 5 [IU] via SUBCUTANEOUS
  Administered 2024-12-08: 2 [IU] via SUBCUTANEOUS
  Administered 2024-12-08: 5 [IU] via SUBCUTANEOUS
  Administered 2024-12-09: 2 [IU] via SUBCUTANEOUS
  Administered 2024-12-09: 8 [IU] via SUBCUTANEOUS
  Administered 2024-12-09: 3 [IU] via SUBCUTANEOUS
  Administered 2024-12-10: 2 [IU] via SUBCUTANEOUS
  Administered 2024-12-10: 3 [IU] via SUBCUTANEOUS
  Administered 2024-12-10 – 2024-12-11 (×2): 5 [IU] via SUBCUTANEOUS
  Filled 2024-12-05: qty 8
  Filled 2024-12-05 (×2): qty 2
  Filled 2024-12-05: qty 11
  Filled 2024-12-05: qty 8
  Filled 2024-12-05: qty 2
  Filled 2024-12-05 (×2): qty 3
  Filled 2024-12-05: qty 5
  Filled 2024-12-05: qty 3
  Filled 2024-12-05: qty 8
  Filled 2024-12-05: qty 2
  Filled 2024-12-05 (×2): qty 5
  Filled 2024-12-05: qty 3
  Filled 2024-12-05: qty 5

## 2024-12-05 MED ORDER — ATORVASTATIN CALCIUM 40 MG PO TABS
40.0000 mg | ORAL_TABLET | Freq: Every day | ORAL | Status: DC
  Administered 2024-12-05 – 2024-12-11 (×7): 40 mg via ORAL
  Filled 2024-12-05 (×7): qty 1

## 2024-12-05 MED ORDER — FUROSEMIDE 10 MG/ML IJ SOLN
20.0000 mg | Freq: Once | INTRAMUSCULAR | Status: AC
  Administered 2024-12-05: 20 mg via INTRAVENOUS
  Filled 2024-12-05: qty 4

## 2024-12-05 MED ORDER — SODIUM CHLORIDE 0.9 % IV SOLN: 1.0000 g | INTRAVENOUS | Status: DC

## 2024-12-05 MED ORDER — OSELTAMIVIR PHOSPHATE 30 MG PO CAPS
30.0000 mg | ORAL_CAPSULE | Freq: Two times a day (BID) | ORAL | Status: AC
  Administered 2024-12-05 – 2024-12-09 (×9): 30 mg via ORAL
  Filled 2024-12-05 (×10): qty 1

## 2024-12-05 MED ORDER — METOPROLOL TARTRATE 5 MG/5ML IV SOLN
5.0000 mg | Freq: Once | INTRAVENOUS | Status: AC
  Administered 2024-12-05: 5 mg via INTRAVENOUS
  Filled 2024-12-05: qty 5

## 2024-12-05 MED ORDER — ONDANSETRON HCL 4 MG/2ML IJ SOLN: 4.0000 mg | Freq: Four times a day (QID) | INTRAMUSCULAR | Status: DC | PRN

## 2024-12-05 MED ORDER — SODIUM CHLORIDE 0.9 % IV SOLN
2.0000 g | Freq: Every day | INTRAVENOUS | Status: DC
  Administered 2024-12-06 – 2024-12-10 (×6): 2 g via INTRAVENOUS
  Filled 2024-12-05 (×5): qty 20

## 2024-12-05 MED ORDER — PROCHLORPERAZINE EDISYLATE 10 MG/2ML IJ SOLN
5.0000 mg | Freq: Once | INTRAMUSCULAR | Status: AC
  Administered 2024-12-05: 5 mg via INTRAVENOUS
  Filled 2024-12-05: qty 2

## 2024-12-05 MED ORDER — SODIUM CHLORIDE 0.9 % IV SOLN
500.0000 mg | INTRAVENOUS | Status: DC
  Filled 2024-12-05: qty 5

## 2024-12-05 MED ORDER — WARFARIN - PHARMACIST DOSING INPATIENT: Freq: Every day | Status: DC

## 2024-12-05 MED ORDER — ACETAMINOPHEN 650 MG RE SUPP: 650.0000 mg | Freq: Four times a day (QID) | RECTAL | Status: DC | PRN

## 2024-12-05 NOTE — Progress Notes (Signed)
 PHARMACY - ANTICOAGULATION CONSULT NOTE  Pharmacy Consult for warfarin Indication: atrial fibrillation  Allergies[1]  Patient Measurements: Height: 5' 2 (157.5 cm) Weight: 81.2 kg (179 lb) IBW/kg (Calculated) : 50.1 HEPARIN  DW (KG): 68.2  Vital Signs: BP: 154/126 (01/02 0430) Pulse Rate: 98 (01/02 0430)  Labs: Recent Labs    12/05/24 0344 12/05/24 0359  HGB 13.5  --   HCT 43.7  --   PLT 173  --   LABPROT  --  25.6*  INR  --  2.2*  CREATININE 1.30*  --     Estimated Creatinine Clearance: 40.3 mL/min (A) (by C-G formula based on SCr of 1.3 mg/dL (H)).   Medical History: Past Medical History:  Diagnosis Date   Arrhythmia    Hx PAF, PVC's   CHF (congestive heart failure) (HCC)    Coronary artery disease    04/04/2017 STEMI   Diabetes [facility])    H/O mitral valve replacement 09/21/2021   Heart attack (HCC)    per pt reprot   Hyperlipidemia    Hypertension    New onset atrial fibrillation (HCC) 05/2017    Medications:  Warfarin 5 mg daily PTA Last dose 12/30 Dose consistent with last visit to anticoag clinic 12/11  Assessment: 69 year old patient with history of atrial fibrillation on warfarin PTA presents with shortness of breath, positive for influenza with concern for CAP. Started on ceftriaxone, azithromycin and Tamiflu.  INR 2.2 on admission - noted patient has missed two days of warfarin. Patient started on azithromycin here which may increase INR.  Goal of Therapy:  INR 2-3 Monitor platelets by anticoagulation protocol: Yes   Plan:  -Continue with warfarin 5 mg daily for now -Patinet may need reduced dose of warfarin for a few doses given also on azithromycin -Daily INR  Stefano MARLA Bologna, PharmD, BCPS Clinical Pharmacist 12/05/2024 9:51 AM       [1]  Allergies Allergen Reactions   Keflex  [Cephalexin ] Rash    Skin on feet peeled.   Asa [Aspirin ]     325 mg ----heartburn Pt can take 81 mg   Enalapril Cough   Lisinopril  Cough

## 2024-12-05 NOTE — Consult Note (Addendum)
 "  Cardiology Consultation  Patient ID: Rachael Davis MRN: [id number]; DOB: [birth year]  Admit date: 12/05/2024 Date of Consult: 12/05/2024  PCP:  Sun, Vyvyan, MD   [facility] Providers Cardiologist:  Annabella Scarce, MD  Electrophysiologist:  Soyla Gladis Norton, MD    Patient Profile: Rachael Davis is a 69 y.o. female with a hx of CAD s/p CABG 2018, chronic systolic CHF/iCM, hx bioprosthetic MV replacement at time of CABG, PVCs, DM, remote AF (shortly after CABG 2018), VT, RBBB, 1AVB  who is being seen 12/05/2024 for the evaluation of acute on chronic HFrEF at the request of Dr. Zella.  History of Present Illness: Rachael Davis's past medical history stated above.  She presented to the [facility] emergency department on 12/04/2024 for cough, shortness of breath that have been occurring since 11/27/2024.  Her family also reported some issues with medication noncompliance, noting that she had not taken any of her diuretics in a couple of days.  They report recent exposure to someone who then tested positive for the flu.  Relevant workup on the ED includes: proBNP significantly elevated at 29,984, respiratory panel positive for influenza A, metabolic panel showed hyponatremia sodium at 134, creatinine lower than baseline at 1.30, CBC showed elevated leukocytosis at 18.4, lactic acid 2.5 ? 2.5.  CXR showed cardiomegaly, pulmonary edema vs PNA, possible left pleural effusion.  Since being in the hospital, she has been given albuterol  inhaler, IV Lasix  20 mg x 1 dose, IV Lopressor  5 mg x 1 dose, started on IV antibiotics.  Started on aspirin  81 mg daily, Lipitor  40 mg daily, carvedilol  12.5 mg twice daily, Farxiga  10 mg daily, presently on IV Lasix  60 mg twice daily, Imdur  30 mg daily, Entresto  24-26 mg twice daily.  She follows closely with our advanced heart failure and electrophysiology teams as an outpatient.  She was last seen by [facility] 09/2024 for follow-up.  Her relevant medication  regimen at this time included: Aspirin  81 mg daily, Lipitor  40 mg daily, carvedilol  12.5 mg twice daily, Farxiga  10 mg daily, Imdur  30 mg daily, Entresto  24-26 mg twice daily, torsemide  40 mg daily + 20 mEq potassium daily, warfarin as directed by clinic.  Did not tolerate spironolactone  due to hyperkalemia.  Upon entering the ER room, the patient is sitting in the chair and she has family members resting in the bed. She tells me that she still feels short of breath. She tells me that she has only has been without her medications x 2 days. She is short of breath, this is likely a combination of both Flu A and some volume overload, will diurese and follow response.   Past Medical History:  Diagnosis Date   Arrhythmia    Hx PAF, PVC's   CHF (congestive heart failure) (HCC)    Coronary artery disease    04/04/2017 STEMI   Diabetes [facility])    H/O mitral valve replacement 09/21/2021   Heart attack (HCC)    per pt reprot   Hyperlipidemia    Hypertension    New onset atrial fibrillation (HCC) 05/2017   Past Surgical History:  Procedure Laterality Date   CORONARY ARTERY BYPASS GRAFT N/A 04/12/2017   Procedure: CORONARY ARTERY BYPASS GRAFTING (CABG), ON PUMP, TIMES FOUR, USING LEFT INTERNAL MAMMARY ARTERY AND ENDOSCOPICALLY HARVESTED RIGHT GREATER SAPHENOUS VEIN;  Surgeon: Fleeta Hanford Coy, MD;  Location: [facility] OR;  Service: Open Heart Surgery;  Laterality: N/A;  LIMA to LAD; SVG to OM; SVG to Ramus; SVG to  Right   ICD IMPLANT N/A 06/30/2022   Procedure: ICD IMPLANT;  Surgeon: Fernande Elspeth BROCKS, MD;  Location: [facility] INVASIVE CV LAB;  Service: Cardiovascular;  Laterality: N/A;   IR THORACENTESIS ASP PLEURAL SPACE W/IMG GUIDE  05/25/2017   LEFT HEART CATH AND CORONARY ANGIOGRAPHY N/A 04/04/2017   Procedure: Left Heart Cath and Coronary Angiography;  Surgeon: Peter M Jordan, MD;  Location: [facility] INVASIVE CV LAB;  Service: Cardiovascular;  Laterality: N/A;   MITRAL VALVE REPAIR N/A 04/12/2017   Procedure: MITRAL VALVE  REPLACEMENT (MVR);  Surgeon: Fleeta Ochoa, Maude, MD;  Location: [facility] OR;  Service: Open Heart Surgery;  Laterality: N/A;  Using a 25mm Magna Ease Mitral Pericardial Bioprosthesis   RIGHT/LEFT HEART CATH AND CORONARY/GRAFT ANGIOGRAPHY N/A 04/20/2022   Procedure: RIGHT/LEFT HEART CATH AND CORONARY/GRAFT ANGIOGRAPHY;  Surgeon: Cherrie Toribio SAUNDERS, MD;  Location: [facility] INVASIVE CV LAB;  Service: Cardiovascular;  Laterality: N/A;   TEE WITHOUT CARDIOVERSION N/A 04/12/2017   Procedure: TRANSESOPHAGEAL ECHOCARDIOGRAM (TEE);  Surgeon: Fleeta Ochoa, Maude, MD;  Location: [facility] OR;  Service: Open Heart Surgery;  Laterality: N/A;   TUBAL LIGATION      Home Medications:  Prior to Admission medications  Medication Sig Start Date End Date Taking? Authorizing Provider  acetaminophen  (TYLENOL ) 325 MG tablet Take 2 tablets (650 mg total) by mouth every 6 (six) hours as needed for mild pain or headache. 05/28/17  Yes Dwan Kyla HERO, PA-C  aspirin  EC 81 MG tablet Take 81 mg by mouth daily.   Yes [provider]  atorvastatin  (LIPITOR ) 40 MG tablet TAKE 1 TABLET BY MOUTH EVERY DAY 07/11/24  Yes Raford Riggs, MD  carvedilol  (COREG ) 12.5 MG tablet Take 1 tablet (12.5 mg total) by mouth 2 (two) times daily. 07/08/24 12/05/24 Yes Camnitz, Soyla Lunger, MD  dapagliflozin  propanediol (FARXIGA ) 10 MG TABS tablet Take 1 tablet (10 mg total) by mouth daily. 10/02/24  Yes Hayes Beckey CROME, NP  ENTRESTO  24-26 MG TAKE 1 TABLET BY MOUTH TWICE A DAY 11/17/24  Yes Bensimhon, Toribio SAUNDERS, MD  glipiZIDE  (GLUCOTROL  XL) 10 MG 24 hr tablet Take 1 tablet (10 mg total) by mouth daily with breakfast. 05/28/17  Yes Zimmerman, Donielle M, PA-C  hydrALAZINE  (APRESOLINE ) 25 MG tablet Take 25 mg by mouth 3 (three) times daily.   Yes [provider]  isosorbide  mononitrate (IMDUR ) 30 MG 24 hr tablet Take 1 tablet (30 mg total) by mouth daily. 07/09/24  Yes Hayes Beckey CROME, NP  metFORMIN  (GLUCOPHAGE ) 1000 MG tablet Take 1,000 mg by mouth 2 (two) times  daily with a meal.   Yes [provider]  RYBELSUS 14 MG TABS Take 1 tablet by mouth every morning.   Yes [provider]  torsemide  (DEMADEX ) 20 MG tablet Take 2 tablets (40 mg total) by mouth daily. 09/20/24  Yes Arrien, Mauricio Daniel, MD  warfarin (COUMADIN ) 5 MG tablet TAKE 1 TABLET BY MOUTH DAILY OR AS DIRECTED BY COUMADIN  CLINIC 03/26/23  Yes Raford Riggs, MD    Scheduled Meds:  albuterol   1-2 puff Inhalation Q4H   aspirin  EC  81 mg Oral Daily   atorvastatin   40 mg Oral Daily   carvedilol   12.5 mg Oral BID   dapagliflozin  propanediol  10 mg Oral Daily   furosemide   60 mg Intravenous BID   insulin  aspart  0-15 Units Subcutaneous TID WC   insulin  aspart  0-5 Units Subcutaneous QHS   isosorbide  mononitrate  30 mg Oral Daily   oseltamivir  30  mg Oral BID   sacubitril -valsartan   1 tablet Oral BID   warfarin  5 mg Oral ONCE-1600   Warfarin - Pharmacist Dosing Inpatient   Does not apply q1600   Continuous Infusions:  [START ON 12/06/2024] azithromycin     [START ON 12/06/2024] cefTRIAXone (ROCEPHIN)  IV     PRN Meds: acetaminophen  **OR** acetaminophen , albuterol , ondansetron  **OR** ondansetron  (ZOFRAN ) IV, traZODone  Allergies:   Allergies[1]  Social History:   Social History   Socioeconomic History   Marital status: Married    Spouse name: Sumiko Ceasar   Number of children: 1   Years of education: Not on file   Highest education level: Bachelor's degree (e.g., BA, AB, BS)  Occupational History   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  Tobacco Use   Smoking status: Never   Smokeless tobacco: Never  Vaping Use   Vaping status: Never Used  Substance and Sexual Activity   Alcohol use: No   Drug use: Yes    Types: Marijuana    Comment: In her 47 's   Sexual activity: Not on file  Other Topics Concern   Not on file  Social History Narrative   Not on file   Social Drivers of Health   Tobacco Use: Low Risk (12/05/2024)   Patient  History    Smoking Tobacco Use: Never    Smokeless Tobacco Use: Never    Passive Exposure: Not on file  Financial Resource Strain: Not on file  Food Insecurity: No Food Insecurity (09/16/2024)   Epic    Worried About Programme Researcher, Broadcasting/film/video in the Last Year: Never true    Ran Out of Food in the Last Year: Never true  Transportation Needs: No Transportation Needs (09/16/2024)   Epic    Lack of Transportation (Medical): No    Lack of Transportation (Non-Medical): No  Physical Activity: Not on file  Stress: Not on file  Social Connections: Moderately Isolated (09/16/2024)   Social Connection and Isolation Panel    Frequency of Communication with Friends and Family: More than three times a week    Frequency of Social Gatherings with Friends and Family: Three times a week    Attends Religious Services: Never    Active Member of Clubs or Organizations: No    Attends Banker Meetings: Never    Marital Status: Married  Catering Manager Violence: Not At Risk (09/16/2024)   Epic    Fear of Current or Ex-Partner: No    Emotionally Abused: No    Physically Abused: No    Sexually Abused: No  Depression (PHQ2-9): Not on file  Alcohol Screen: Low Risk (04/06/2022)   Alcohol Screen    Last Alcohol Screening Score (AUDIT): 0  Housing: Low Risk (09/16/2024)   Epic    Unable to Pay for Housing in the Last Year: No    Number of Times Moved in the Last Year: 0    Homeless in the Last Year: No  Utilities: Not At Risk (09/16/2024)   Epic    Threatened with loss of utilities: No  Health Literacy: Not on file    Family History:   Family History  Problem Relation Age of Onset   Heart attack Father 80   Breast cancer Maternal Grandmother     ROS:  Please see the history of present illness.  All other ROS reviewed and negative.     Physical Exam/Data: Vitals:   12/05/24 9684 12/05/24 0430 12/05/24 9562  BP: (!) 143/104 (!) 154/126   Pulse: (!) 50 98   Resp: (!) 29 20   SpO2:  100% 96%   Weight:   81.2 kg  Height:   5' 2 (1.575 m)   No intake or output data in the 24 hours ending 12/05/24 1011    12/05/2024    4:37 AM 10/02/2024    2:09 PM 09/22/2024    3:18 PM  Last 3 Weights  Weight (lbs) 179 lb 173 lb 3.2 oz 171 lb  Weight (kg) 81.194 kg 78.563 kg 77.565 kg     Body mass index is 32.74 kg/m.   General:  in no acute distress, up in the chair, on RA HEENT: normal Neck: + JVD Vascular: No carotid bruits; Distal pulses 2+ bilaterally Cardiac:  normal S1, S2; RRR; no murmur  Lungs:  decreased breath sounds   Abd: soft, nontender, no hepatomegaly  Ext: no edema Musculoskeletal:  No deformities, BUE and BLE strength normal and equal Skin: warm and dry  Neuro:  CNs 2-12 intact, no focal abnormalities noted Psych:  Normal affect   EKG:  The EKG was personally reviewed and demonstrates: First-degree AV block, RBBB, PVCs,  Telemetry:  Telemetry was personally reviewed and demonstrates:  sinus, 1st degree AVB, rates 90s  Relevant CV Studies:  Echocardiogram, 09/17/2024 Left ventricular ejection fraction, by estimation, is < 20% . The left ventricle has severely decreased function. The left ventricle demonstrates global hypokinesis. Left ventricular diastolic parameters are indeterminate.  Right ventricular systolic function is normal. The right ventricular size is normal.  Left atrial size was mildly dilated.  The mitral valve has been repaired/ replaced. No evidence of mitral valve regurgitation. No evidence of mitral stenosis. There is a 25 mm Magna Ease bioprosthetic present in the mitral position. Procedure Date: 10/ 19/ 22. Echo findings are consistent with normal structure and function of the mitral valve prosthesis.  The aortic valve is tricuspid. Aortic valve regurgitation is not visualized. No aortic stenosis is present.  The inferior vena cava is normal in size with greater than 50% respiratory variability, suggesting right atrial pressure of 3  mmHg.  Remote device check, 10/01/2024 ICD: Scheduled remote reviewed. Normal device function.  Presenting rhythm: VS   Long-term monitor, 04/28/2024 Sinus rhythm (versus atrial pacing) with first-degree AV block  - avg HR of 83 bpm. Occasioanl V-pacing present.  Eight nonsustained runs of Ventricular Tachycardia occurred, the run with the fastest interval lasting 7 beats with a max rate of 200 bpm, the longest lasting 7 beats with an avg rate of 158 bpm.  60 runs of Supraventricular Tachycardia occurred, the run with the fastest interval lasting 18.8 secs with a max rate of 128 bpm, the longest lasting 1 min 28 secs with an avg rate of 111 bpm.  Occasional PVCs (3.2%, 74614) Ventricular Bigeminy and Trigeminy were present. One patient-triggered event associated with PVC.   Right/left heart cath, 04/20/2024   Prox LAD to Mid LAD lesion is 65% stenosed.   Lat Ramus lesion is 50% stenosed.   Prox RCA to Mid RCA lesion is 99% stenosed.   Dist RCA lesion is 95% stenosed.   Ramus-1 lesion is 95% stenosed.   Ramus-2 lesion is 100% stenosed.   Prox LAD lesion is 90% stenosed.   Origin to Prox Graft lesion is 100% stenosed.   Origin to Prox Graft lesion is 100% stenosed.   Prox Cx lesion is 100% stenosed.   Findings:   Ao = 126/65 (90)  LV = 114/13 RA =  10 RV = 52/9 PA = 50/18 (33) PCW = 22 (v = 30) Fick cardiac output/index = 4.6/2.6 PVR = 2.4 WU FA sat = 94% PA sat = 60%, 63% MV gradient on LV-wedge tracing = mean 13.0 MVA 0.93cm2 PAPi = 3.2   Assessment: 1. Severe 3v CAD 2. LIMA to LAD and SVG to OM-1 widely patent 3. SVG to RCA and SVG to Ramus occluded 4. LVEF 25% 5. Normal LVEDP 6. Moderate mixed PAH 7. Apparent severe mitral stenosis (suspect overestimated based on echo)  Amyloid imaging, 04/20/2024   By semi-quantitative assessment scan is consistent with increased heart uptake but less than rib uptake-Grade 1. Heart to contralateral lung ratio is between 1-1.5,  indeterminate for amyloid. Ratio 1.2.   Study is equivocal for TTR amyloidosis (visual score of 1/ratio between 1-1.5).   Semiquantitative uptake 1 and H/CL ratio 1.2; equivocal.   Laboratory Data: High Sensitivity Troponin:  No results for input(s): TROPONINIHS in the last 720 hours. No results for input(s): TRNPT in the last 720 hours.    Chemistry Recent Labs  Lab 12/05/24 0344  NA 134*  K 5.1  CL 99  CO2 19*  GLUCOSE 342*  BUN 21  CREATININE 1.30*  CALCIUM  9.3  GFRNONAA 44*  ANIONGAP 15    No results for input(s): PROT, ALBUMIN , AST, ALT, ALKPHOS, BILITOT in the last 168 hours. Lipids No results for input(s): CHOL, TRIG, HDL, LABVLDL, LDLCALC, CHOLHDL in the last 168 hours.  Hematology Recent Labs  Lab 12/05/24 0344  WBC 18.4*  RBC 5.87*  HGB 13.5  HCT 43.7  MCV 74.4*  MCH 23.0*  MCHC 30.9  RDW 20.2*  PLT 173   Thyroid  No results for input(s): TSH, FREET4 in the last 168 hours.  BNP Recent Labs  Lab 12/05/24 0344  PROBNP 29,984.0*    DDimer No results for input(s): DDIMER in the last 168 hours.  Radiology/Studies:  DG Chest Portable 1 View Result Date: 12/05/2024 EXAM: 1 VIEW(S) XRAY OF THE CHEST 12/05/2024 03:31:00 AM COMPARISON: 09/15/2024 CLINICAL HISTORY: sob FINDINGS: LUNGS AND PLEURA: Perihilar and interstitial opacities. Possible left pleural effusion. No pneumothorax. HEART AND MEDIASTINUM: Cardiomegaly. Left subclavian cardiac rhythm device noted. BONES AND SOFT TISSUES: Median sternotomy noted. No acute osseous abnormality. IMPRESSION: 1. Cardiomegaly with left subclavian cardiac rhythm device, similar to 09/15/2024. 2. Perihilar and interstitial opacities, which may reflect pulmonary edema or pneumonia. 3. Possible left pleural effusion. Electronically signed by: Evalene Coho MD 12/05/2024 04:08 AM EST RP Workstation: [id number]   Assessment and Plan:  Acute on chronic HFrEF Home meds: Carvedilol  12.5 mg BID,  Farxiga  10 mg daily, Entresto  24-26 mg BID, Imdur  30 mg daily, torsemide  40 mg daily with 20 mEq K daily  Echo 09/2024: LVEF <20%, global hypokinesis, normal RV systolic function, s/p MVR with normal structure/function of prosthesis, no other valvular abnormalities, normal IVC proBNP significantly elevated [phone] Family reports patient has not taken medications in a few days Positive for influenza A Remains hypertensive Reports no significant UOP, IV Lasix  increased today Creatinine 1.3 today, below baseline Started on IV diuresis, presently on IV Lasix  60 mg twice daily -- continue this, follow response Restarted on PTA carvedilol  12.5 mg twice daily, Farxiga  10 mg daily, Imdur  30 mg daily, Entresto  24-26 mg twice daily Previously did not tolerate spironolactone  given hyperkalemia  CAD s/p CABG x 4 LIMA ? LAD, SVG ? RI, SVG ? OM, SVG ? RCA 04/2017 Home meds: Aspirin  81  mg daily, Lipitor  40 mg daily, Imdur  30 mg daily Denies any chest pain  EKG without changes compared to prior tracings  R/LHC 2023: Patent LIMA to LAD, SVG to OM1, occluded SVG to RCA, occluded SVG to ramus, normal LVEDP Patient denies any chest pain Continue home medications  Bioprosthetic MVR Echo 09/2024 showed no MR/MS, mean gradient 4 mmHg Continue to follow as an outpatient   History of VT s/p ICD RBBB, SVT, NSVT First-degree AV block Follows closely with our EP team as an outpatient S/p [employer] single-chamber ICD ZIO monitor 01/2024: Sinus rhythm with first-degree AV block, occasional V pacing, NSVT x 8 episodes, SVT x 60 episodes, occasional PVCs, no atrial fibrillation detected Will ensure device is interrogated this admission  Paroxysmal atrial fibrillation, remote Noting s/p CABG in 2018 Home meds: Warfarin, carvedilol  12.5 mg BID ZIO monitor 01/2024: Sinus rhythm with first-degree AV block, occasional V pacing, NSVT x 8 episodes, SVT x 60 episodes, occasional PVCs, no atrial fibrillation  detected Restarted on PTA carvedilol  Continue warfarin per pharmacy   Per primary Influenza A infection CKD stage IIIa Type II diabetes Community-acquired pneumonia Lactic acidosis  Risk Assessment/Risk Scores:      New [place]  Heart Association (NYHA) Functional Class NYHA Class II  CHA2DS2-VASc Score = 6   This indicates a 9.7% annual risk of stroke. The patient's score is based upon: CHF History: 1 HTN History: 1 Diabetes History: 1 Stroke History: 0 Vascular Disease History: 1 Age Score: 1 Gender Score: 1      For questions or updates, please contact [facility] Please consult [email or web address] for contact info under   Signed, Waddell DELENA Donath, PA-C  12/05/2024 10:11 AM  Personally seen and examined. Agree with above.  69 year old here with influenza A with shortness of breath acute on chronic systolic heart failure EF less than 20% post mitral valve repair CABG x 4 in 2018 bioprosthetic valve.  Also has paroxysmal atrial fibrillation and currently on telemetry appears to be in atrial fibrillation currently at first glance however twelve-lead EKG does appear to have P waves present with lengthened PR interval.  She is protected nonetheless with warfarin.  Pharmacy protocol.  Continues with carvedilol  12.5 twice daily.  Heart rates currently in the low 100s.  Has a single-chamber ICD.  2 grandchildren at bedside present.  Wheezing heard bilaterally on exam.  Alert and orient x 3, mildly tachycardic and slightly irregular  -Continue with IV Lasix  60 mg twice a day -Continue with Coreg  12.5 twice a day, Farxiga  10 mg a day isosorbide  30 mg a day as well as Entresto  24/26 daily. -Avoiding spironolactone  because of hyperkalemia in the past  Coronary disease post CABG is stable.  Heart catheterization in 2023 showed patent LIMA to LAD SVG to OM 1.  Both her SVG to RCA and SVG to ramus were occluded.  Normal EDP.  Bioprosthetic mitral valve-mean gradient 4 mmHg in  October 2025.  Followed as outpatient.  Stable.  Oneil Parchment, MD     [1]  Allergies Allergen Reactions   Keflex  [Cephalexin ] Rash    Skin on feet peeled.   Asa [Aspirin ]     325 mg ----heartburn Pt can take 81 mg   Enalapril Cough   Lisinopril  Cough   "

## 2024-12-05 NOTE — ED Notes (Signed)
 Patient lactic is 2.46  RN and MD made aware

## 2024-12-05 NOTE — Progress Notes (Signed)
 Heart Failure Navigator Progress Note  Assessed for Heart & Vascular TOC clinic readiness.  Patient does not meet criteria due to she is an Advanced Heart Failure Team patient of Dr. Bensimhon. .   Navigator will sign off at this time.   Stephane Haddock, BSN, Scientist, Clinical (histocompatibility And Immunogenetics) Only

## 2024-12-05 NOTE — Plan of Care (Signed)
  Problem: Nutritional: Goal: Maintenance of adequate nutrition will improve Outcome: Progressing Goal: Progress toward achieving an optimal weight will improve Outcome: Progressing   Problem: Metabolic: Goal: Ability to maintain appropriate glucose levels will improve Outcome: Progressing   Problem: Health Behavior/Discharge Planning: Goal: Ability to identify and utilize available resources and services will improve Outcome: Progressing Goal: Ability to manage health-related needs will improve Outcome: Progressing

## 2024-12-05 NOTE — ED Notes (Signed)
 Patient lactic is 2.50  RN and MD made aware

## 2024-12-05 NOTE — H&P (Signed)
 " History and Physical  Rachael Davis FMW:[id number] DOB: 07-01-1956 DOA: 12/05/2024  PCP: Sun, Vyvyan, MD   Chief Complaint: Shortness of breath  HPI: Rachael Davis is a 69 y.o. female with medical history significant for atrial fibrillation on Coumadin , chronic systolic congestive heart failure with EF 20%, diabetes, hypertension being admitted to the hospital with evidence of acute heart failure in the setting of influenza A infection.  History is provided by the patient as well as her daughter who is at the bedside.  [place] that they all got sick with cold symptoms about 2 to 3 days ago, patient had cough, some subjective shortness of breath.  She was feeling so sick that she did not take her medications for a couple of days, but she has not had progressive shortness of breath, with bilateral lower extremity swelling.  She denies fevers, she has had a productive cough for the last couple of days, initially started off as clear, but now has turned dark brown.  She also mentions that she has been having trouble sleeping because of the cough, and because of orthopnea.  Review of Systems: Please see HPI for pertinent positives and negatives. A complete 10 system review of systems are otherwise negative.  Past Medical History:  Diagnosis Date   Arrhythmia    Hx PAF, PVC's   CHF (congestive heart failure) (HCC)    Coronary artery disease    04/04/2017 STEMI   Diabetes [facility])    H/O mitral valve replacement 09/21/2021   Heart attack (HCC)    per pt reprot   Hyperlipidemia    Hypertension    New onset atrial fibrillation (HCC) 05/2017   Past Surgical History:  Procedure Laterality Date   CORONARY ARTERY BYPASS GRAFT N/A 04/12/2017   Procedure: CORONARY ARTERY BYPASS GRAFTING (CABG), ON PUMP, TIMES FOUR, USING LEFT INTERNAL MAMMARY ARTERY AND ENDOSCOPICALLY HARVESTED RIGHT GREATER SAPHENOUS VEIN;  Surgeon: Fleeta Hanford Coy, MD;  Location: [facility] OR;  Service: Open Heart Surgery;  Laterality: N/A;   LIMA to LAD; SVG to OM; SVG to Ramus; SVG to Right   ICD IMPLANT N/A 06/30/2022   Procedure: ICD IMPLANT;  Surgeon: Fernande Elspeth JAYSON, MD;  Location: [facility] INVASIVE CV LAB;  Service: Cardiovascular;  Laterality: N/A;   IR THORACENTESIS ASP PLEURAL SPACE W/IMG GUIDE  05/25/2017   LEFT HEART CATH AND CORONARY ANGIOGRAPHY N/A 04/04/2017   Procedure: Left Heart Cath and Coronary Angiography;  Surgeon: Peter M Jordan, MD;  Location: [facility] INVASIVE CV LAB;  Service: Cardiovascular;  Laterality: N/A;   MITRAL VALVE REPAIR N/A 04/12/2017   Procedure: MITRAL VALVE REPLACEMENT (MVR);  Surgeon: Fleeta Hanford, Coy, MD;  Location: [facility] OR;  Service: Open Heart Surgery;  Laterality: N/A;  Using a 25mm Magna Ease Mitral Pericardial Bioprosthesis   RIGHT/LEFT HEART CATH AND CORONARY/GRAFT ANGIOGRAPHY N/A 04/20/2022   Procedure: RIGHT/LEFT HEART CATH AND CORONARY/GRAFT ANGIOGRAPHY;  Surgeon: Cherrie Toribio SAUNDERS, MD;  Location: [facility] INVASIVE CV LAB;  Service: Cardiovascular;  Laterality: N/A;   TEE WITHOUT CARDIOVERSION N/A 04/12/2017   Procedure: TRANSESOPHAGEAL ECHOCARDIOGRAM (TEE);  Surgeon: Fleeta Hanford, Coy, MD;  Location: [facility] OR;  Service: Open Heart Surgery;  Laterality: N/A;   TUBAL LIGATION     Social History:  reports that she has never smoked. She has never used smokeless tobacco. She reports current drug use. Drug: Marijuana. She reports that she does not drink alcohol.  Allergies[1]  Family History  Problem Relation Age of Onset   Heart attack Father 66  Breast cancer Maternal Grandmother      Prior to Admission medications  Medication Sig Start Date End Date Taking? Authorizing Provider  acetaminophen  (TYLENOL ) 325 MG tablet Take 2 tablets (650 mg total) by mouth every 6 (six) hours as needed for mild pain or headache. 05/28/17  Yes Dwan Aldo M, PA-C  aspirin  EC 81 MG tablet Take 81 mg by mouth daily.   Yes [provider]  atorvastatin  (LIPITOR ) 40 MG tablet TAKE 1 TABLET BY MOUTH EVERY DAY  07/11/24  Yes Raford Riggs, MD  carvedilol  (COREG ) 12.5 MG tablet Take 1 tablet (12.5 mg total) by mouth 2 (two) times daily. 07/08/24 12/05/24 Yes Camnitz, Soyla Lunger, MD  dapagliflozin  propanediol (FARXIGA ) 10 MG TABS tablet Take 1 tablet (10 mg total) by mouth daily. 10/02/24  Yes Hayes Beckey CROME, NP  ENTRESTO  24-26 MG TAKE 1 TABLET BY MOUTH TWICE A DAY 11/17/24  Yes Bensimhon, Toribio SAUNDERS, MD  glipiZIDE  (GLUCOTROL  XL) 10 MG 24 hr tablet Take 1 tablet (10 mg total) by mouth daily with breakfast. 05/28/17  Yes Zimmerman, Donielle M, PA-C  hydrALAZINE  (APRESOLINE ) 25 MG tablet Take 25 mg by mouth 3 (three) times daily.   Yes [provider]  isosorbide  mononitrate (IMDUR ) 30 MG 24 hr tablet Take 1 tablet (30 mg total) by mouth daily. 07/09/24  Yes Hayes Beckey CROME, NP  metFORMIN  (GLUCOPHAGE ) 1000 MG tablet Take 1,000 mg by mouth 2 (two) times daily with a meal.   Yes [provider]  RYBELSUS 14 MG TABS Take 1 tablet by mouth every morning.   Yes [provider]  torsemide  (DEMADEX ) 20 MG tablet Take 2 tablets (40 mg total) by mouth daily. 09/20/24  Yes Arrien, Mauricio Daniel, MD  warfarin (COUMADIN ) 5 MG tablet TAKE 1 TABLET BY MOUTH DAILY OR AS DIRECTED BY COUMADIN  CLINIC 03/26/23  Yes Raford Riggs, MD    Physical Exam: BP (!) 154/126   Pulse 98   Resp 20   Ht 5' 2 (1.575 m)   Wt 81.2 kg   SpO2 96%   BMI 32.74 kg/m  General:  Alert, oriented, calm, in no acute distress, family at the bedside.  She is resting comfortably on room air, sitting upright as she feels short of breath when she tries to lie flat. Eyes: EOMI, clear conjuctivae, white sclerea Neck: supple, no masses, trachea mildline  Cardiovascular: RRR, no murmurs or rubs, no peripheral edema  Respiratory: clear to auscultation bilaterally but breath sounds are distant, no wheezes, bilateral basilar crackles  Abd: soft, nontender, nondistended, normal bowel tones heard  Skin: dry, no rashes   Musculoskeletal: no joint effusions, normal range of motion  Psychiatric: appropriate affect, normal speech  Neurologic: extraocular muscles intact, clear speech, moving all extremities with intact sensorium         Labs on Admission:  Basic Metabolic Panel: Recent Labs  Lab 12/05/24 0344  NA 134*  K 5.1  CL 99  CO2 19*  GLUCOSE 342*  BUN 21  CREATININE 1.30*  CALCIUM  9.3   Liver Function Tests: No results for input(s): AST, ALT, ALKPHOS, BILITOT, PROT, ALBUMIN  in the last 168 hours. No results for input(s): LIPASE, AMYLASE in the last 168 hours. No results for input(s): AMMONIA in the last 168 hours. CBC: Recent Labs  Lab 12/05/24 0344  WBC 18.4*  HGB 13.5  HCT 43.7  MCV 74.4*  PLT 173   Cardiac Enzymes: No results for input(s): CKTOTAL, CKMB, CKMBINDEX, TROPONINI in the last 168  hours. BNP (last 3 results) Recent Labs    07/09/24 1443 09/15/24 1455 10/02/24 1442  BNP 1,085.7* 2,832.1* 872.4*    ProBNP (last 3 results) Recent Labs    12/05/24 0344  PROBNP 29,984.0*   CBG: No results for input(s): GLUCAP in the last 168 hours.  Radiological Exams on Admission: DG Chest Portable 1 View Result Date: 12/05/2024 EXAM: 1 VIEW(S) XRAY OF THE CHEST 12/05/2024 03:31:00 AM COMPARISON: 09/15/2024 CLINICAL HISTORY: sob FINDINGS: LUNGS AND PLEURA: Perihilar and interstitial opacities. Possible left pleural effusion. No pneumothorax. HEART AND MEDIASTINUM: Cardiomegaly. Left subclavian cardiac rhythm device noted. BONES AND SOFT TISSUES: Median sternotomy noted. No acute osseous abnormality. IMPRESSION: 1. Cardiomegaly with left subclavian cardiac rhythm device, similar to 09/15/2024. 2. Perihilar and interstitial opacities, which may reflect pulmonary edema or pneumonia. 3. Possible left pleural effusion. Electronically signed by: Evalene Coho MD 12/05/2024 04:08 AM EST RP Workstation: [id number]   Assessment/Plan Rachael Davis is a 69  y.o. female with medical history significant for atrial fibrillation on Coumadin , chronic systolic congestive heart failure with EF 20%, diabetes, hypertension being admitted to the hospital with evidence of acute heart failure in the setting of influenza A infection.    Acute on chronic systolic congestive heart failure-patient with known EF 20%, per echo done October 2025.  She now presents with lower extremity edema, elevated proBNP, orthopnea likely due to influenza A and medication noncompliance. -Inpatient admission -Monitor closely on telemetry -Continue goal-directed therapy as able -Will plan for IV Lasix  60 mg twice daily, monitor electrolytes closely -Inpatient cardiology evaluation requested  Severe sepsis-meeting criteria with leukocytosis, tachycardia, endorgan dysfunction with lactate 2.5.  Source of sepsis is influenza A and suspected community-acquired pneumonia.  Not receiving fluid boluses due to acute heart failure and baseline EF 20%. -Treat infections as below  Influenza A-patient with cough, dyspnea and orthopnea, but not hypoxic -Continue Tamiflu as well as droplet precautions  Community-acquired pneumonia-with leukocytosis, sputum production and possible opacity on chest x-ray.  Was empirically started on IV antibiotics in the emergency department. -Will continue empiric IV azithromycin and IV Rocephin  Type 2 diabetes-carb modified heart healthy diet, continue Farxiga , moderate dose sliding scale  Hypertension-continue home Coreg , Imdur , Entresto , holding hydralazine  in the setting of aggressive diuresis  CKD stage IIIa-renal function appears generally to be at baseline, will monitor closely with daily labs while diuresing  DVT prophylaxis: Warfarin    Code Status: Full Code  Consults called: None  Admission status: The appropriate patient status for this patient is INPATIENT. Inpatient status is judged to be reasonable and necessary in order to provide the  required intensity of service to ensure the patient's safety. The patient's presenting symptoms, physical exam findings, and initial radiographic and laboratory data in the context of their chronic comorbidities is felt to place them at high risk for further clinical deterioration. Furthermore, it is not anticipated that the patient will be medically stable for discharge from the hospital within 2 midnights of admission.    I certify that at the point of admission it is my clinical judgment that the patient will require inpatient hospital care spanning beyond 2 midnights from the point of admission due to high intensity of service, high risk for further deterioration and high frequency of surveillance required  Time spent: 59 minutes  Kanyon Seibold CHRISTELLA Gail MD [facility] Pager [phone]  If 7PM-7AM, please contact night-coverage [email or web address] Password [email or web address]  12/05/2024, 7:55 AM      [1]  Allergies Allergen Reactions  Keflex  [Cephalexin ] Rash    Skin on feet peeled.   Asa [Aspirin ]     325 mg ----heartburn Pt can take 81 mg   Enalapril Cough   Lisinopril  Cough   "

## 2024-12-05 NOTE — ED Provider Notes (Signed)
 " Rachael Davis EMERGENCY DEPARTMENT AT [facility] Provider Note   CSN: [id number] Arrival date & time: 12/05/24  9695     Patient presents with: Cough and Shortness of Breath   Rachael Davis is a 69 y.o. female who include STEMI, type 2 diabetes, hypertension, CABG x 4, paroxysmal atrial fibrillation on blood thinner, volume overload, acute on chronic systolic CHF, CKD stage III, ventricular tachycardia, defibrillator in place.  Presents to ED for evaluation of shortness of breath.  Reports progressively worsening shortness of breath ongoing for the last 2 days.  Reports possible flu exposure and has been having productive cough and shortness of breath since then.  Patient has been unable to take medications for the last few days for unknown reasons.  Has not had diuretics recently.  Denies any chest pain, denies nausea or vomiting.  Denies abdominal pain.  She denies any leg swelling beyond baseline.  Denies lightheadedness or dizziness on standing but does report that shortness of breath is much worse when she ambulates.  Attending Dr. Jerral asked to come to patient bedside for evaluation as she is tachycardic with increased work of breathing.   Cough Associated symptoms: shortness of breath   Shortness of Breath Associated symptoms: cough        Prior to Admission medications  Medication Sig Start Date End Date Taking? Authorizing Provider  acetaminophen  (TYLENOL ) 325 MG tablet Take 2 tablets (650 mg total) by mouth every 6 (six) hours as needed for mild pain or headache. 05/28/17  Yes Dwan Kyla HERO, PA-C  aspirin  EC 81 MG tablet Take 81 mg by mouth daily.   Yes [provider]  atorvastatin  (LIPITOR ) 40 MG tablet TAKE 1 TABLET BY MOUTH EVERY DAY 07/11/24  Yes Raford Riggs, MD  carvedilol  (COREG ) 12.5 MG tablet Take 1 tablet (12.5 mg total) by mouth 2 (two) times daily. 07/08/24 12/05/24 Yes Camnitz, Soyla Lunger, MD  dapagliflozin  propanediol (FARXIGA ) 10  MG TABS tablet Take 1 tablet (10 mg total) by mouth daily. 10/02/24  Yes Hayes Beckey CROME, NP  ENTRESTO  24-26 MG TAKE 1 TABLET BY MOUTH TWICE A DAY 11/17/24  Yes Bensimhon, Toribio SAUNDERS, MD  glipiZIDE  (GLUCOTROL  XL) 10 MG 24 hr tablet Take 1 tablet (10 mg total) by mouth daily with breakfast. 05/28/17  Yes Zimmerman, Donielle M, PA-C  hydrALAZINE  (APRESOLINE ) 25 MG tablet Take 25 mg by mouth 3 (three) times daily.   Yes [provider]  isosorbide  mononitrate (IMDUR ) 30 MG 24 hr tablet Take 1 tablet (30 mg total) by mouth daily. 07/09/24  Yes Hayes Beckey CROME, NP  metFORMIN  (GLUCOPHAGE ) 1000 MG tablet Take 1,000 mg by mouth 2 (two) times daily with a meal.   Yes [provider]  RYBELSUS 14 MG TABS Take 1 tablet by mouth every morning.   Yes [provider]  torsemide  (DEMADEX ) 20 MG tablet Take 2 tablets (40 mg total) by mouth daily. 09/20/24  Yes Arrien, Mauricio Daniel, MD  warfarin (COUMADIN ) 5 MG tablet TAKE 1 TABLET BY MOUTH DAILY OR AS DIRECTED BY COUMADIN  CLINIC 03/26/23  Yes Raford Riggs, MD    Allergies: Keflex  [cephalexin ], Asa [aspirin ], Enalapril, and Lisinopril     Review of Systems  Respiratory:  Positive for cough and shortness of breath.   All other systems reviewed and are negative.   Updated Vital Signs BP (!) 154/126   Pulse 98   Resp 20   Ht 5' 2 (1.575 m)   Wt 81.2 kg  SpO2 96%   BMI 32.74 kg/m   Physical Exam Vitals and nursing note reviewed.  Constitutional:      General: She is not in acute distress.    Appearance: She is well-developed.  HENT:     Head: Normocephalic and atraumatic.  Eyes:     Conjunctiva/sclera: Conjunctivae normal.  Cardiovascular:     Rate and Rhythm: Tachycardia present. Rhythm irregular.     Heart sounds: No murmur heard. Pulmonary:     Effort: Pulmonary effort is normal. No respiratory distress.     Breath sounds: Normal breath sounds.  Abdominal:     Palpations: Abdomen is soft.     Tenderness: There is no  abdominal tenderness.  Musculoskeletal:        General: No swelling.     Cervical back: Neck supple.     Right lower leg: Edema present.     Left lower leg: Edema present.  Skin:    General: Skin is warm and dry.     Capillary Refill: Capillary refill takes less than 2 seconds.  Neurological:     Mental Status: She is alert and oriented to person, place, and time. Mental status is at baseline.  Psychiatric:        Mood and Affect: Mood normal.     (all labs ordered are listed, but only abnormal results are displayed) Labs Reviewed  RESP PANEL BY RT-PCR (RSV, FLU A&B, COVID)  RVPGX2 - Abnormal; Notable for the following components:      Result Value   Influenza A by PCR POSITIVE (*)    All other components within normal limits  CBC - Abnormal; Notable for the following components:   WBC 18.4 (*)    RBC 5.87 (*)    MCV 74.4 (*)    MCH 23.0 (*)    RDW 20.2 (*)    All other components within normal limits  BASIC METABOLIC PANEL WITH GFR - Abnormal; Notable for the following components:   Sodium 134 (*)    CO2 19 (*)    Glucose, Bld 342 (*)    Creatinine, Ser 1.30 (*)    GFR, Estimated 44 (*)    All other components within normal limits  PRO BRAIN NATRIURETIC PEPTIDE - Abnormal; Notable for the following components:   Pro Brain Natriuretic Peptide 29,984.0 (*)    All other components within normal limits  PROTIME-INR - Abnormal; Notable for the following components:   Prothrombin Time 25.6 (*)    INR 2.2 (*)    All other components within normal limits  I-STAT CG4 LACTIC ACID, ED - Abnormal; Notable for the following components:   Lactic Acid, Venous 2.5 (*)    All other components within normal limits  CULTURE, BLOOD (ROUTINE X 2)  CULTURE, BLOOD (ROUTINE X 2)  I-STAT CG4 LACTIC ACID, ED    EKG: EKG Interpretation Date/Time:  Friday December 05 2024 03:14:22 EST Ventricular Rate:  109 PR Interval:  150 QRS Duration:  149 QT Interval:  371 QTC Calculation: 500 R  Axis:   96  Text Interpretation: Sinus or ectopic atrial tachycardia Ventricular bigeminy RBBB and LPFB Inferior infarct, old Confirmed by Jerral Meth [phone]) on 12/05/2024 4:23:29 AM  Radiology: DG Chest Portable 1 View Result Date: 12/05/2024 EXAM: 1 VIEW(S) XRAY OF THE CHEST 12/05/2024 03:31:00 AM COMPARISON: 09/15/2024 CLINICAL HISTORY: sob FINDINGS: LUNGS AND PLEURA: Perihilar and interstitial opacities. Possible left pleural effusion. No pneumothorax. HEART AND MEDIASTINUM: Cardiomegaly. Left subclavian cardiac rhythm device noted. BONES  AND SOFT TISSUES: Median sternotomy noted. No acute osseous abnormality. IMPRESSION: 1. Cardiomegaly with left subclavian cardiac rhythm device, similar to 09/15/2024. 2. Perihilar and interstitial opacities, which may reflect pulmonary edema or pneumonia. 3. Possible left pleural effusion. Electronically signed by: Evalene Coho MD 12/05/2024 04:08 AM EST RP Workstation: [id number]    Procedures   Medications Ordered in the ED  azithromycin (ZITHROMAX) 500 mg in sodium chloride  0.9 % 250 mL IVPB (500 mg Intravenous New Bag/Given 12/05/24 0506)  albuterol  (VENTOLIN  HFA) 108 (90 Base) MCG/ACT inhaler 1-2 puff (2 puffs Inhalation Given 12/05/24 0500)  oseltamivir (TAMIFLU) capsule 30 mg (has no administration in time range)  metoprolol  tartrate (LOPRESSOR ) injection 5 mg (5 mg Intravenous Given 12/05/24 0356)  furosemide  (LASIX ) injection 20 mg (20 mg Intravenous Given 12/05/24 0433)  cefTRIAXone (ROCEPHIN) 1 g in sodium chloride  0.9 % 100 mL IVPB (0 g Intravenous Stopped 12/05/24 0503)     Medical Decision Making Amount and/or Complexity of Data Reviewed Labs: ordered. Radiology: ordered.  Risk Prescription drug management. Decision regarding hospitalization.   This is a 69 year old female presenting to the ED due to concerns of shortness of breath.  Patient arrives tachycardic and in a regular rhythm, A-fib.  Lung sounds clear bilaterally, O2 sat 96  at room air.  Abdomen soft and compressible.  Neuroexam at baseline.  1+ edema to bilateral lower extremities.  Will assess with CBC, BMP, viral panel, BNP, PT/INR, lactic acid, blood cultures, chest x-ray, EKG.  Patient given albuterol  for symptomatic care.  Patient given a dose metoprolol  5 mg IV for uncontrolled heart rate.  CBC is with leukocytosis to 18.4, no anemia.  Metabolic panel with sodium 134, potassium 5.1, glucose 342, creatinine 1.3 which is baseline, GFR 44 and anion gap 15.  INR appropriately elevated in the setting of anticoagulation.  Lactic acid elevated to 2.5.  BNP elevated to 29,984 with findings of fluid overload on chest x-ray as well with concerning findings of community-acquired pneumonia.  Patient started on 20 mg of Lasix  at this time.  Started on Rocephin, azithromycin for community-acquired pneumonia.  At this time patient requires admission to hospital for community-acquired pneumonia, volume overload.  Have paged hospitalist at this time.  Update: Spoke with Dr. Franky who has requested Tamiflu administration.  Has agreed to admit patient.  Patient stable on admission.  Agreeable to plan.   Final diagnoses:  Community acquired pneumonia, unspecified laterality  Acute on chronic congestive heart failure, unspecified heart failure type [facility])    ED Discharge Orders     None          Ruthell Lonni FALCON, PA-C 12/05/24 0554    Jerral Meth, MD 12/05/24 [phone]  "

## 2024-12-05 NOTE — ED Triage Notes (Signed)
" °  Patient BIB family for cough and SOB that has been going on since 12/25.  Family states patient was exposed to someone with the flu and has had productive cough and SOB since.  Hx CHF, afib, and CABG.  Family states patient does not take medications like she is supposed to at time.  States she has not taken her diuretic in a couple of days.  Endorses generalized body aches.  Pain 5/10, aching.  "

## 2024-12-05 NOTE — ED Notes (Signed)
 Pt states she is having a hard time breathing. Pt O2 is 100% RA. MD notified.

## 2024-12-06 DIAGNOSIS — Z95810 Presence of automatic (implantable) cardiac defibrillator: Secondary | ICD-10-CM | POA: Diagnosis not present

## 2024-12-06 DIAGNOSIS — J189 Pneumonia, unspecified organism: Secondary | ICD-10-CM

## 2024-12-06 DIAGNOSIS — I330 Acute and subacute infective endocarditis: Secondary | ICD-10-CM | POA: Diagnosis not present

## 2024-12-06 DIAGNOSIS — A419 Sepsis, unspecified organism: Secondary | ICD-10-CM | POA: Diagnosis not present

## 2024-12-06 DIAGNOSIS — I5023 Acute on chronic systolic (congestive) heart failure: Secondary | ICD-10-CM

## 2024-12-06 DIAGNOSIS — R6520 Severe sepsis without septic shock: Secondary | ICD-10-CM

## 2024-12-06 DIAGNOSIS — Z953 Presence of xenogenic heart valve: Secondary | ICD-10-CM

## 2024-12-06 DIAGNOSIS — I5021 Acute systolic (congestive) heart failure: Secondary | ICD-10-CM

## 2024-12-06 DIAGNOSIS — B953 Streptococcus pneumoniae as the cause of diseases classified elsewhere: Secondary | ICD-10-CM | POA: Diagnosis not present

## 2024-12-06 DIAGNOSIS — N1831 Chronic kidney disease, stage 3a: Secondary | ICD-10-CM

## 2024-12-06 DIAGNOSIS — E1122 Type 2 diabetes mellitus with diabetic chronic kidney disease: Secondary | ICD-10-CM | POA: Diagnosis not present

## 2024-12-06 DIAGNOSIS — J09X1 Influenza due to identified novel influenza A virus with pneumonia: Secondary | ICD-10-CM

## 2024-12-06 DIAGNOSIS — I480 Paroxysmal atrial fibrillation: Secondary | ICD-10-CM | POA: Diagnosis not present

## 2024-12-06 LAB — BASIC METABOLIC PANEL WITH GFR
Anion gap: 11 (ref 5–15)
BUN: 35 mg/dL — ABNORMAL HIGH (ref 8–23)
CO2: 22 mmol/L (ref 22–32)
Calcium: 8.6 mg/dL — ABNORMAL LOW (ref 8.9–10.3)
Chloride: 105 mmol/L (ref 98–111)
Creatinine, Ser: 1.43 mg/dL — ABNORMAL HIGH (ref 0.44–1.00)
GFR, Estimated: 40 mL/min — ABNORMAL LOW
Glucose, Bld: 127 mg/dL — ABNORMAL HIGH (ref 70–99)
Potassium: 4.5 mmol/L (ref 3.5–5.1)
Sodium: 138 mmol/L (ref 135–145)

## 2024-12-06 LAB — CBC
HCT: 38.7 % (ref 36.0–46.0)
Hemoglobin: 11.9 g/dL — ABNORMAL LOW (ref 12.0–15.0)
MCH: 22.7 pg — ABNORMAL LOW (ref 26.0–34.0)
MCHC: 30.7 g/dL (ref 30.0–36.0)
MCV: 73.7 fL — ABNORMAL LOW (ref 80.0–100.0)
Platelets: 160 K/uL (ref 150–400)
RBC: 5.25 MIL/uL — ABNORMAL HIGH (ref 3.87–5.11)
RDW: 19.4 % — ABNORMAL HIGH (ref 11.5–15.5)
WBC: 15.9 K/uL — ABNORMAL HIGH (ref 4.0–10.5)
nRBC: 0 % (ref 0.0–0.2)

## 2024-12-06 LAB — GLUCOSE, CAPILLARY
Glucose-Capillary: 118 mg/dL — ABNORMAL HIGH (ref 70–99)
Glucose-Capillary: 169 mg/dL — ABNORMAL HIGH (ref 70–99)
Glucose-Capillary: 183 mg/dL — ABNORMAL HIGH (ref 70–99)
Glucose-Capillary: 266 mg/dL — ABNORMAL HIGH (ref 70–99)

## 2024-12-06 LAB — PRO BRAIN NATRIURETIC PEPTIDE: Pro Brain Natriuretic Peptide: 29685 pg/mL — ABNORMAL HIGH

## 2024-12-06 LAB — PROTIME-INR
INR: 2.7 — ABNORMAL HIGH (ref 0.8–1.2)
Prothrombin Time: 29.5 s — ABNORMAL HIGH (ref 11.4–15.2)

## 2024-12-06 MED ORDER — WARFARIN SODIUM 2.5 MG PO TABS
2.5000 mg | ORAL_TABLET | Freq: Once | ORAL | Status: AC
  Administered 2024-12-06: 2.5 mg via ORAL
  Filled 2024-12-06: qty 1

## 2024-12-06 MED ORDER — ALBUTEROL SULFATE HFA 108 (90 BASE) MCG/ACT IN AERS: 2.0000 | INHALATION_SPRAY | RESPIRATORY_TRACT | Status: DC | PRN

## 2024-12-06 MED ORDER — FLUTICASONE PROPIONATE 50 MCG/ACT NA SUSP
2.0000 | Freq: Every day | NASAL | Status: DC
  Filled 2024-12-06: qty 16

## 2024-12-06 MED ORDER — ALBUTEROL SULFATE (2.5 MG/3ML) 0.083% IN NEBU
2.5000 mg | INHALATION_SOLUTION | Freq: Four times a day (QID) | RESPIRATORY_TRACT | Status: DC
  Administered 2024-12-07: 2.5 mg via RESPIRATORY_TRACT
  Filled 2024-12-06 (×2): qty 3

## 2024-12-06 MED ORDER — FUROSEMIDE 10 MG/ML IJ SOLN
80.0000 mg | Freq: Two times a day (BID) | INTRAMUSCULAR | Status: DC
  Administered 2024-12-06 – 2024-12-10 (×8): 80 mg via INTRAVENOUS
  Filled 2024-12-06 (×8): qty 8

## 2024-12-06 MED ORDER — LORATADINE 10 MG PO TABS
10.0000 mg | ORAL_TABLET | Freq: Every day | ORAL | Status: DC
  Administered 2024-12-06 – 2024-12-11 (×4): 10 mg via ORAL
  Filled 2024-12-06 (×5): qty 1

## 2024-12-06 MED ORDER — PANTOPRAZOLE SODIUM 40 MG PO TBEC
40.0000 mg | DELAYED_RELEASE_TABLET | Freq: Every day | ORAL | Status: DC
  Administered 2024-12-07 – 2024-12-11 (×5): 40 mg via ORAL
  Filled 2024-12-06 (×5): qty 1

## 2024-12-06 NOTE — Progress Notes (Signed)
 "  Rounding Note   Patient Name: Rachael Davis Date of Encounter: 12/06/2024  [facility] Cardiologist: Annabella Scarce, MD   Subjective - No acute events overnight - Patient says that she feels pretty well with improvement in her SOB - Still gets some DOE but certainly better.  Scheduled Meds:  albuterol   2.5 mg Nebulization Q4H   aspirin  EC  81 mg Oral Daily   atorvastatin   40 mg Oral Daily   carvedilol   12.5 mg Oral BID   dapagliflozin  propanediol  10 mg Oral Daily   furosemide   60 mg Intravenous BID   insulin  aspart  0-15 Units Subcutaneous TID WC   insulin  aspart  0-5 Units Subcutaneous QHS   isosorbide  mononitrate  30 mg Oral Daily   oseltamivir   30 mg Oral BID   sacubitril -valsartan   1 tablet Oral BID   Warfarin - Pharmacist Dosing Inpatient   Does not apply q1600   Continuous Infusions:  cefTRIAXone  (ROCEPHIN )  IV 2 g (12/06/24 0007)   PRN Meds: acetaminophen  **OR** acetaminophen , albuterol , ondansetron  **OR** ondansetron  (ZOFRAN ) IV, traZODone    Vital Signs  Vitals:   12/06/24 0105 12/06/24 0429 12/06/24 0510 12/06/24 0827  BP: 122/88 108/72  111/62  Pulse: 78 74  81  Resp: 19 18    Temp: 97.7 F (36.5 C) (!) 97.5 F (36.4 C)    TempSrc: Oral Oral    SpO2: 100% 98% 97%   Weight:      Height:        Intake/Output Summary (Last 24 hours) at 12/06/2024 0850 Last data filed at 12/06/2024 0600 Gross per 24 hour  Intake 340 ml  Output 350 ml  Net -10 ml      12/05/2024    4:37 AM 10/02/2024    2:09 PM 09/22/2024    3:18 PM  Last 3 Weights  Weight (lbs) 179 lb 173 lb 3.2 oz 171 lb  Weight (kg) 81.194 kg 78.563 kg 77.565 kg      Telemetry NSR, PVCs- Personally Reviewed  ECG  No new ECG  Physical Exam  GEN: No acute distress.   Neck: JVD to 2 cm above clavicle sitting upright, thyromegaly Cardiac: RRR, no murmurs, rubs, or gallops.  Respiratory: Bibasilar rales, no wheezes or rhonchi GI: Soft, nontender, non-distended  MS: 2+ pitting  edema bilaterally, legs cool to touch; No deformity. Neuro:  Nonfocal  Psych: Normal affect   Labs High Sensitivity Troponin:  No results for input(s): TROPONINIHS in the last 720 hours. No results for input(s): TRNPT in the last 720 hours.     Chemistry Recent Labs  Lab 12/05/24 0344 12/06/24 0426  NA 134* 138  K 5.1 4.5  CL 99 105  CO2 19* 22  GLUCOSE 342* 127*  BUN 21 35*  CREATININE 1.30* 1.43*  CALCIUM  9.3 8.6*  GFRNONAA 44* 40*  ANIONGAP 15 11    Lipids No results for input(s): CHOL, TRIG, HDL, LABVLDL, LDLCALC, CHOLHDL in the last 168 hours.  Hematology Recent Labs  Lab 12/05/24 0344 12/06/24 0426  WBC 18.4* 15.9*  RBC 5.87* 5.25*  HGB 13.5 11.9*  HCT 43.7 38.7  MCV 74.4* 73.7*  MCH 23.0* 22.7*  MCHC 30.9 30.7  RDW 20.2* 19.4*  PLT 173 160   Thyroid  No results for input(s): TSH, FREET4 in the last 168 hours.  BNP Recent Labs  Lab 12/05/24 0344 12/06/24 0426  PROBNP 29,984.0* 70,314.9*    DDimer No results for input(s): DDIMER in the last 168  hours.   Radiology  DG Chest Portable 1 View Result Date: 12/05/2024 EXAM: 1 VIEW(S) XRAY OF THE CHEST 12/05/2024 03:31:00 AM COMPARISON: 09/15/2024 CLINICAL HISTORY: sob FINDINGS: LUNGS AND PLEURA: Perihilar and interstitial opacities. Possible left pleural effusion. No pneumothorax. HEART AND MEDIASTINUM: Cardiomegaly. Left subclavian cardiac rhythm device noted. BONES AND SOFT TISSUES: Median sternotomy noted. No acute osseous abnormality. IMPRESSION: 1. Cardiomegaly with left subclavian cardiac rhythm device, similar to 09/15/2024. 2. Perihilar and interstitial opacities, which may reflect pulmonary edema or pneumonia. 3. Possible left pleural effusion. Electronically signed by: Evalene Coho MD 12/05/2024 04:08 AM EST RP Workstation: [id number]    Cardiac Studies  TTE 09/17/24:  IMPRESSIONS     1. Left ventricular ejection fraction, by estimation, is <20%. The left  ventricle has  severely decreased function. The left ventricle demonstrates  global hypokinesis. Left ventricular diastolic parameters are  indeterminate.   2. Right ventricular systolic function is normal. The right ventricular  size is normal.   3. Left atrial size was mildly dilated.   4. The mitral valve has been repaired/replaced. No evidence of mitral  valve regurgitation. No evidence of mitral stenosis. There is a 25 mm  Magna Ease bioprosthetic present in the mitral position. Procedure Date:  09/21/21. Echo findings are consistent  with normal structure and function of the mitral valve prosthesis.   5. The aortic valve is tricuspid. Aortic valve regurgitation is not  visualized. No aortic stenosis is present.   6. The inferior vena cava is normal in size with greater than 50%  respiratory variability, suggesting right atrial pressure of 3 mmHg.    Patient Profile   Rachael Davis is a 69 y.o. 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 Zella.   Assessment & Plan   #Acute on Chronic HFrEF Exacerbation - Admitted on 1/2 for shortness of breath and found to have a markedly elevated BNP and volume overload in the setting of influenza infection and medication noncompliance - Still appears to be volume up despite IV diuresis without robust urine output. - We will plan to increase her diuretic dose today with hopeful improvement in her urine output. - Blood pressures are little on the softer side today so we will hold her Imdur  and blood cultures had a positive result so we will hold Farxiga . Increase Lasix  to 80 mg IV twice daily Continue carvedilol  12.5 mg twice daily Continue Entresto  low-dose Hold Imdur  for softer blood pressure Hold Farxiga  in  the setting of possible bacteremia Complete echocardiogram  #CAD s/p 4V CABG - No chest pain symptoms. -Continue home medications Continue aspirin  81 mg daily Continue Lipitor  40 mg daily Holding Imdur  for blood pressures  #PAF #bAVR Continue warfarin Daily INRs Maintain telemetry Continue carvedilol  12.5 mg twice daily  #VT s/p [employer] Sci ICD - No VT on telemetry -If the patient is bacteremic then we will be concern for seeding of device. - Will get an echocardiogram to evaluate. Complete echocardiogram to rule out vegetations  #Influenza A PNA #Possible Strep Bacteremia - Management per primary. -I will reach out to the hospitalist to see about repeating blood cultures and the utility of ID involvement         For questions or updates, please contact [facility] Please consult [email or web address] for contact info under       Signed, Georganna Archer, MD  12/06/2024, 8:50 AM    "

## 2024-12-06 NOTE — Progress Notes (Signed)
 PHARMACY - ANTICOAGULATION CONSULT NOTE  Pharmacy Consult for warfarin Indication: atrial fibrillation  Allergies[1]  Patient Measurements: Height: 5' 2 (157.5 cm) Weight: 81.2 kg (179 lb) IBW/kg (Calculated) : 50.1 HEPARIN  DW (KG): 68.2  Vital Signs: Temp: 97.5 F (36.4 C) (01/03 0429) Temp Source: Oral (01/03 0429) BP: 111/62 (01/03 0827) Pulse Rate: 81 (01/03 0827)  Labs: Recent Labs    12/05/24 0344 12/05/24 0359 12/06/24 0426  HGB 13.5  --  11.9*  HCT 43.7  --  38.7  PLT 173  --  160  LABPROT  --  25.6* 29.5*  INR  --  2.2* 2.7*  CREATININE 1.30*  --  1.43*    Estimated Creatinine Clearance: 36.6 mL/min (A) (by C-G formula based on SCr of 1.43 mg/dL (H)).   Medical History: Past Medical History:  Diagnosis Date   Arrhythmia    Hx PAF, PVC's   CHF (congestive heart failure) (HCC)    Coronary artery disease    04/04/2017 STEMI   Diabetes [facility])    H/O mitral valve replacement 09/21/2021   Heart attack (HCC)    per pt reprot   Hyperlipidemia    Hypertension    New onset atrial fibrillation (HCC) 05/2017    Medications:  Warfarin 5 mg daily PTA Last dose 12/30 Dose consistent with last visit to anticoag clinic 12/11  Assessment: 69 year old patient with history of atrial fibrillation on warfarin PTA presents with shortness of breath, positive for influenza with concern for CAP. Started on ceftriaxone , azithromycin  and Tamiflu .  INR 2.2 on admission - noted patient has missed two days of warfarin. Patient started on azithromycin  here which may increase INR.  12/06/2024 INR 2.7 - therapeutic Day 2 azithromycin , ceftriaxone , tamiflu  Hg 13.5> 11.5 PLT 160 K SCr 1.3> 1.43 No bleeding reported  Goal of Therapy:  INR 2-3 Monitor platelets by anticoagulation protocol: Yes   Plan:  -warfarin 2.5 mg po x 1 dose today -Daily INR\   Rosaline IVAR Edison, Pharm.D Use secure chat for questions 12/06/2024 12:05 PM     [1]  Allergies Allergen Reactions    Keflex  [Cephalexin ] Rash    Skin on feet peeled.   Asa [Aspirin ]     325 mg ----heartburn Pt can take 81 mg   Enalapril Cough   Lisinopril  Cough

## 2024-12-06 NOTE — Progress Notes (Signed)
 " PROGRESS NOTE    Rachael Davis  FMW:[id number] DOB: Feb 20, 1956 DOA: 12/05/2024 PCP: Sun, Vyvyan, MD    Chief Complaint  Patient presents with   Cough   Shortness of Breath    Brief Narrative:  Rachael Davis is a 69 y.o. female with medical history significant for atrial fibrillation on Coumadin , chronic systolic congestive heart failure with EF 20%, diabetes, hypertension being admitted to the hospital with evidence of acute heart failure in the setting of influenza A infection, pneumonia, Streptococcus bacteremia.  Cardiology, ID consulted.   Assessment & Plan:   Principal Problem:   Acute CHF (congestive heart failure) (HCC) Active Problems:   Paroxysmal atrial fibrillation (HCC)   Acute systolic (congestive) heart failure (HCC)   Bacteremia due to Streptococcus pneumoniae   Community acquired pneumonia   Influenza A with pneumonia   Severe sepsis (HCC)   S/P mitral valve replacement with bioprosthetic valve   Type 2 diabetes mellitus with stage 3a chronic kidney disease, without long-term current use of insulin  (HCC)  #1 acute on chronic systolic heart failure -Patient presented with lower extremity edema, orthopnea felt likely secondary to influenza A, medication noncompliance, elevated proBNP of 29,984. - Patient with known EF <20% with left ventricular global hypokinesis (2D echo 09/17/2024). - Patient on Lasix  60 mg IV every 12 hours with urine output of 350 cc recorded over the past 24 hours. - Patient still volume overloaded on examination. - Patient seen in consultation by cardiology IV Lasix  dose increased to 80 mg every 12 hours. - 2D echo ordered to rule out endocarditis as patient with a Streptococcus bacteremia and ICD. - Continue carvedilol  12.5 mg twice daily, low-dose Entresto . - Cardiology recommend to hold Imdur  due to softer blood pressure. - Cardiology recommend to hold Farxiga  in the setting of bacteremia. - Cardiology following appreciate the input  and recommendations.  2.  Severe sepsis secondary to influenza A, probable pneumonia, Streptococcus bacteremia. - Patient met criteria for sepsis on admission with leukocytosis, tachycardia, endorgan dysfunction with a lactic of 2.5. - Source of sepsis felt to be multifactorial secondary to influenza A, suspected community-acquired pneumonia, Streptococcus bacteremia. - Patient not received fluid boluses due acute CHF exacerbation with a baseline EF <20%. - Continue empiric IV Rocephin  and Tamiflu . - 2D echo ordered to rule out vegetations as patient noted with ICD. - ID consulted for further evaluation and management.  3.  Influenza A -Patient noted to have presented with cough, dyspnea, orthopnea. - Patient with no hypoxia. - Continue Tamiflu .  4.  Community-acquired pneumonia  -Patient with leukocytosis, productive cough, chest x-ray concerning for pneumonia. - Blood cultures with Streptococcus bacteremia. - Azithromycin  discontinued. - Continue IV Rocephin .  5.  Streptococcus bacteremia - May have seeded from probable pneumonia. - Patient status post ICD concern for possible vegetations. - 2D echo ordered. - ID consulted and repeat blood cultures recommended to be done tomorrow. - Continue IV Rocephin . - ID following.  6.  CKD stage IIIa -Renal function at baseline. - Monitor with diuresis and Entresto .  7.  Hypertension -Continue carvedilol , Entresto , IV Lasix . - Imdur  held today per cardiology recommendations due to soft blood pressure and to allow room for diuresis.  8.  Diabetes mellitus type 2 -Hemoglobin A1c 6.6 (09/16/2024) - CBG 118 this morning. - Farxiga  discontinued due to acute infection with bacteremia. - SSI.  9.  Paroxysmal A-fib/bioprosthetic mitral valve replacement - Continue carvedilol  for rate control - Coumadin  for anticoagulation.  10.  History of  VT status post [employer] ICD -No VT noted on telemetry. - Patient being followed by  cardiology, concern for seeding of device with bacteremia. - 2D echo ordered. - Per cardiology.   DVT prophylaxis: Coumadin  Code Status: Full Family Communication: Updated patient.  No family at bedside. Disposition: Home when clinically improved and cleared by ID and cardiology.  Status is: Inpatient Remains inpatient appropriate because: Severity of disease.   Consultants:  Cardiology: Dr. Jeffrie 12/05/2024 ID: Dr. Luiz 12/06/2024  Procedures:  Chest x-ray 12/05/2024   Antimicrobials:  Anti-infectives (From admission, onward)    Start     Dose/Rate Route Frequency Ordered Stop   12/06/24 0800  azithromycin  (ZITHROMAX ) 500 mg in sodium chloride  0.9 % 250 mL IVPB  Status:  Discontinued        500 mg 250 mL/hr over 60 Minutes Intravenous Every 24 hours 12/05/24 0754 12/06/24 0604   12/06/24 0800  cefTRIAXone  (ROCEPHIN ) 1 g in sodium chloride  0.9 % 100 mL IVPB  Status:  Discontinued        1 g 200 mL/hr over 30 Minutes Intravenous Every 24 hours 12/05/24 0754 12/05/24 2323   12/05/24 2324  cefTRIAXone  (ROCEPHIN ) 2 g in sodium chloride  0.9 % 100 mL IVPB        2 g 200 mL/hr over 30 Minutes Intravenous Daily at bedtime 12/05/24 2323     12/05/24 1800  oseltamivir  (TAMIFLU ) capsule 30 mg        30 mg Oral 2 times daily 12/05/24 0747 12/10/24 0759   12/05/24 0600  oseltamivir  (TAMIFLU ) capsule 75 mg  Status:  Discontinued        75 mg Oral  Once 12/05/24 0553 12/05/24 0554   12/05/24 0600  oseltamivir  (TAMIFLU ) capsule 30 mg        30 mg Oral  Once 12/05/24 0554 12/05/24 0624   12/05/24 0430  cefTRIAXone  (ROCEPHIN ) 1 g in sodium chloride  0.9 % 100 mL IVPB        1 g 200 mL/hr over 30 Minutes Intravenous  Once 12/05/24 0424 12/05/24 0503   12/05/24 0430  azithromycin  (ZITHROMAX ) 500 mg in sodium chloride  0.9 % 250 mL IVPB        500 mg 250 mL/hr over 60 Minutes Intravenous  Once 12/05/24 0424 12/05/24 0606         Subjective: Patient sitting up in recliner.  Denies any chest  pain.  Feels cough is slowly improving with whitish sputum.  Feels shortness of breath and wheezing are slowly improving as well.  Feeling much better than she did on admission.  Just received a breathing treatment.  States she is not urinating as much as she has before in the past on IV Lasix .  Objective: Vitals:   12/06/24 0856 12/06/24 1204 12/06/24 1346 12/06/24 1614  BP:   100/71   Pulse:   77   Resp:   18   Temp:   97.7 F (36.5 C)   TempSrc:   Oral   SpO2: 98% 99% 100% 98%  Weight:      Height:        Intake/Output Summary (Last 24 hours) at 12/06/2024 1810 Last data filed at 12/06/2024 1700 Gross per 24 hour  Intake 460 ml  Output 1100 ml  Net -640 ml   Filed Weights   12/05/24 0437  Weight: 81.2 kg    Examination:  General exam: Appears calm and comfortable  Respiratory system: Some scattered crackles.  No significant wheezing.  No  rhonchi.  Fair air movement.  No use of accessory muscles of respiration.   Cardiovascular system: S1 & S2 heard, RRR.  Positive JVD.  No murmurs rubs or gallops.  2+ bilateral lower extremity pitting edema.  Gastrointestinal system: Abdomen is nondistended, soft and nontender. No organomegaly or masses felt. Normal bowel sounds heard. Central nervous system: Alert and oriented. No focal neurological deficits. Extremities: 2+ bilateral lower extremity edema.  Symmetric 5 x 5 power. Skin: No rashes, lesions or ulcers Psychiatry: Judgement and insight appear normal. Mood & affect appropriate.     Data Reviewed: I have personally reviewed following labs and imaging studies  CBC: Recent Labs  Lab 12/05/24 0344 12/06/24 0426  WBC 18.4* 15.9*  HGB 13.5 11.9*  HCT 43.7 38.7  MCV 74.4* 73.7*  PLT 173 160    Basic Metabolic Panel: Recent Labs  Lab 12/05/24 0344 12/06/24 0426  NA 134* 138  K 5.1 4.5  CL 99 105  CO2 19* 22  GLUCOSE 342* 127*  BUN 21 35*  CREATININE 1.30* 1.43*  CALCIUM  9.3 8.6*    GFR: Estimated Creatinine  Clearance: 36.6 mL/min (A) (by C-G formula based on SCr of 1.43 mg/dL (H)).  Liver Function Tests: No results for input(s): AST, ALT, ALKPHOS, BILITOT, PROT, ALBUMIN  in the last 168 hours.  CBG: Recent Labs  Lab 12/05/24 1557 12/05/24 2012 12/06/24 0757 12/06/24 1142 12/06/24 1647  GLUCAP 269* 117* 118* 169* 183*     Recent Results (from the past 240 hours)  Resp panel by RT-PCR (RSV, Flu A&B, Covid) Anterior Nasal Swab     Status: Abnormal   Collection Time: 12/05/24  3:44 AM   Specimen: Anterior Nasal Swab  Result Value Ref Range Status   SARS Coronavirus 2 by RT PCR NEGATIVE NEGATIVE Final    Comment: (NOTE) SARS-CoV-2 target nucleic acids are NOT DETECTED.  The SARS-CoV-2 RNA is generally detectable in upper respiratory specimens during the acute phase of infection. The lowest concentration of SARS-CoV-2 viral copies this assay can detect is 138 copies/mL. A negative result does not preclude SARS-Cov-2 infection and should not be used as the sole basis for treatment or other patient management decisions. A negative result may occur with  improper specimen collection/handling, submission of specimen other than nasopharyngeal swab, presence of viral mutation(s) within the areas targeted by this assay, and inadequate number of viral copies(<138 copies/mL). A negative result must be combined with clinical observations, patient history, and epidemiological information. The expected result is Negative.  Fact Sheet for Patients:  bloggercourse.com  Fact Sheet for Healthcare Providers:  seriousbroker.it  This test is no t yet approved or cleared by the [place]  FDA and  has been authorized for detection and/or diagnosis of SARS-CoV-2 by FDA under an Emergency Use Authorization (EUA). This EUA will remain  in effect (meaning this test can be used) for the duration of the COVID-19 declaration under Section  564(b)(1) of the Act, 21 U.S.C.section 360bbb-3(b)(1), unless the authorization is terminated  or revoked sooner.       Influenza A by PCR POSITIVE (A) NEGATIVE Final   Influenza B by PCR NEGATIVE NEGATIVE Final    Comment: (NOTE) The Xpert Xpress SARS-CoV-2/FLU/RSV plus assay is intended as an aid in the diagnosis of influenza from Nasopharyngeal swab specimens and should not be used as a sole basis for treatment. Nasal washings and aspirates are unacceptable for Xpert Xpress SARS-CoV-2/FLU/RSV testing.  Fact Sheet for Patients: bloggercourse.com  Fact Sheet for Healthcare Providers: seriousbroker.it  This test is not yet approved or cleared by the [place]  FDA and has been authorized for detection and/or diagnosis of SARS-CoV-2 by FDA under an Emergency Use Authorization (EUA). This EUA will remain in effect (meaning this test can be used) for the duration of the COVID-19 declaration under Section 564(b)(1) of the Act, 21 U.S.C. section 360bbb-3(b)(1), unless the authorization is terminated or revoked.     Resp Syncytial Virus by PCR NEGATIVE NEGATIVE Final    Comment: (NOTE) Fact Sheet for Patients: bloggercourse.com  Fact Sheet for Healthcare Providers: seriousbroker.it  This test is not yet approved or cleared by the [place]  FDA and has been authorized for detection and/or diagnosis of SARS-CoV-2 by FDA under an Emergency Use Authorization (EUA). This EUA will remain in effect (meaning this test can be used) for the duration of the COVID-19 declaration under Section 564(b)(1) of the Act, 21 U.S.C. section 360bbb-3(b)(1), unless the authorization is terminated or revoked.  Performed at [facility], [address] [place], [place] [address]   Blood culture (routine x 2)     Status: Abnormal (Preliminary result)   Collection Time:  12/05/24  4:05 AM   Specimen: BLOOD RIGHT WRIST  Result Value Ref Range Status   Specimen Description   Final    BLOOD RIGHT WRIST Performed at [facility], [address] [place], [place] [address]    Special Requests   Final    BOTTLES DRAWN AEROBIC AND ANAEROBIC Blood Culture results may not be optimal due to an inadequate volume of blood received in culture bottles Performed at [facility], [address] [place], [place] [address]    Culture  Setup Time   Final    GRAM POSITIVE COCCI IN PAIRS IN BOTH AEROBIC AND ANAEROBIC BOTTLES CRITICAL RESULT CALLED TO, READ BACK BY AND VERIFIED WITH: PHARMD M. BELL [id number] @ 2322 FH    Culture (A)  Final    STREPTOCOCCUS PNEUMONIAE SUSCEPTIBILITIES TO FOLLOW Performed at [facility], [address] [place], [place] [address]    Report Status PENDING  Incomplete  Blood Culture ID Panel (Reflexed)     Status: Abnormal   Collection Time: 12/05/24  4:05 AM  Result Value Ref Range Status   Enterococcus faecalis NOT DETECTED NOT DETECTED Final   Enterococcus Faecium NOT DETECTED NOT DETECTED Final   Listeria monocytogenes NOT DETECTED NOT DETECTED Final   Staphylococcus species NOT DETECTED NOT DETECTED Final   Staphylococcus aureus (BCID) NOT DETECTED NOT DETECTED Final   Staphylococcus epidermidis NOT DETECTED NOT DETECTED Final   Staphylococcus lugdunensis NOT DETECTED NOT DETECTED Final   Streptococcus species DETECTED (A) NOT DETECTED Final    Comment: CRITICAL RESULT CALLED TO, READ BACK BY AND VERIFIED WITH: PHARMD M. BELL [id number] @ 2322 FH    Streptococcus agalactiae NOT DETECTED NOT DETECTED Final   Streptococcus pneumoniae DETECTED (A) NOT DETECTED Final    Comment: CRITICAL RESULT CALLED TO, READ BACK BY AND VERIFIED WITH: PHARMD M. BELL [id number] @ 2322 FH    Streptococcus pyogenes NOT DETECTED NOT DETECTED Final   A.calcoaceticus-baumannii NOT DETECTED NOT DETECTED Final   Bacteroides fragilis  NOT DETECTED NOT DETECTED Final   Enterobacterales NOT DETECTED NOT DETECTED Final   Enterobacter cloacae complex NOT DETECTED NOT DETECTED Final   Escherichia coli NOT DETECTED NOT DETECTED Final   Klebsiella aerogenes NOT DETECTED NOT DETECTED Final   Klebsiella oxytoca NOT DETECTED NOT DETECTED Final   Klebsiella  pneumoniae NOT DETECTED NOT DETECTED Final   Proteus species NOT DETECTED NOT DETECTED Final   Salmonella species NOT DETECTED NOT DETECTED Final   Serratia marcescens NOT DETECTED NOT DETECTED Final   Haemophilus influenzae NOT DETECTED NOT DETECTED Final   Neisseria meningitidis NOT DETECTED NOT DETECTED Final   Pseudomonas aeruginosa NOT DETECTED NOT DETECTED Final   Stenotrophomonas maltophilia NOT DETECTED NOT DETECTED Final   Candida albicans NOT DETECTED NOT DETECTED Final   Candida auris NOT DETECTED NOT DETECTED Final   Candida glabrata NOT DETECTED NOT DETECTED Final   Candida krusei NOT DETECTED NOT DETECTED Final   Candida parapsilosis NOT DETECTED NOT DETECTED Final   Candida tropicalis NOT DETECTED NOT DETECTED Final   Cryptococcus neoformans/gattii NOT DETECTED NOT DETECTED Final    Comment: Performed at [facility], [address] [place], [place] [address]  Blood culture (routine x 2)     Status: None (Preliminary result)   Collection Time: 12/05/24  3:13 PM   Specimen: BLOOD RIGHT ARM  Result Value Ref Range Status   Specimen Description   Final    BLOOD RIGHT ARM Performed at [facility], [address], [place], [place] [address]    Special Requests   Final    BOTTLES DRAWN AEROBIC ONLY Blood Culture results may not be optimal due to an inadequate volume of blood received in culture bottles Performed at [facility], [address], [place], [place] [address]    Culture   Final    NO GROWTH < 24 HOURS Performed at [facility], [address] [place], [place] [address]    Report Status PENDING  Incomplete          Radiology Studies: DG Chest Portable 1 View Result Date: 12/05/2024 EXAM: 1 VIEW(S) XRAY OF THE CHEST 12/05/2024 03:31:00 AM COMPARISON: 09/15/2024 CLINICAL HISTORY: sob FINDINGS: LUNGS AND PLEURA: Perihilar and interstitial opacities. Possible left pleural effusion. No pneumothorax. HEART AND MEDIASTINUM: Cardiomegaly. Left subclavian cardiac rhythm device noted. BONES AND SOFT TISSUES: Median sternotomy noted. No acute osseous abnormality. IMPRESSION: 1. Cardiomegaly with left subclavian cardiac rhythm device, similar to 09/15/2024. 2. Perihilar and interstitial opacities, which may reflect pulmonary edema or pneumonia. 3. Possible left pleural effusion. Electronically signed by: Evalene Coho MD 12/05/2024 04:08 AM EST RP Workstation: [id number]        Scheduled Meds:  albuterol   2.5 mg Nebulization Q4H   aspirin  EC  81 mg Oral Daily   atorvastatin   40 mg Oral Daily   carvedilol   12.5 mg Oral BID   furosemide   80 mg Intravenous BID   insulin  aspart  0-15 Units Subcutaneous TID WC   insulin  aspart  0-5 Units Subcutaneous QHS   isosorbide  mononitrate  30 mg Oral Daily   oseltamivir   30 mg Oral BID   sacubitril -valsartan   1 tablet Oral BID   Warfarin - Pharmacist Dosing Inpatient   Does not apply q1600   Continuous Infusions:  cefTRIAXone  (ROCEPHIN )  IV 2 g (12/06/24 0007)     LOS: 1 day    Time spent: 45 minutes    Toribio Hummer, MD [facility]   To contact the attending provider between 7A-7P or the covering provider during after hours 7P-7A, please log into the web site [email or web address] and access using universal [place] password for that web site. If you do not have the password, please call the hospital operator.  12/06/2024, 6:10 PM    "

## 2024-12-06 NOTE — Consult Note (Addendum)
 "   [facility]  Total days of antibiotics 3/ oseltamivir  and day 2 of ceftriaxone        Reason for Consult: flu A and secondary streptococcal PNA with secondary bacteremia    Referring Physician: sebastian  Principal Problem:   Acute CHF (congestive heart failure) (HCC) Active Problems:   Acute systolic (congestive) heart failure (HCC)   Bacteremia due to Streptococcus pneumoniae    HPI: Rachael Davis is a 69 y.o. female with hx of afib on coumadin , T2DM, CAD/dHF with EF20%, hx of cardiac device, who was admitted on 12/31 with 2-3 day history of shortness of breath, fever, malaise and now with productive cough and missed a few days of medicine since feeling poorly. On admit, she met severe sepsis criteria with LA 2.5. and started on ceftriaxone , and azithromycin . Infectious work up revealed strep pneumoniae bacteremia.   Past Medical History:  Diagnosis Date   Arrhythmia    Hx PAF, PVC's   CHF (congestive heart failure) (HCC)    Coronary artery disease    04/04/2017 STEMI   Diabetes [facility])    H/O mitral valve replacement 09/21/2021   Heart attack (HCC)    per pt reprot   Hyperlipidemia    Hypertension    New onset atrial fibrillation (HCC) 05/2017    Allergies: Allergies[1]  MEDICATIONS:  albuterol   2.5 mg Nebulization Q4H   aspirin  EC  81 mg Oral Daily   atorvastatin   40 mg Oral Daily   carvedilol   12.5 mg Oral BID   furosemide   80 mg Intravenous BID   insulin  aspart  0-15 Units Subcutaneous TID WC   insulin  aspart  0-5 Units Subcutaneous QHS   isosorbide  mononitrate  30 mg Oral Daily   oseltamivir   30 mg Oral BID   sacubitril -valsartan   1 tablet Oral BID   warfarin  2.5 mg Oral ONCE-1600   Warfarin - Pharmacist Dosing Inpatient   Does not apply q1600    Social History[2]  Family History  Problem Relation Age of Onset   Heart attack Father 46   Breast cancer Maternal Grandmother     Review of Systems -  12 point ros is otherwise  negative except what is mentioned above  OBJECTIVE: Temp:  [97.5 F (36.4 C)-98.5 F (36.9 C)] 97.7 F (36.5 C) (01/03 1346) Pulse Rate:  [72-81] 77 (01/03 1346) Resp:  [18-20] 18 (01/03 1346) BP: (100-122)/(60-88) 100/71 (01/03 1346) SpO2:  [94 %-100 %] 100 % (01/03 1346) Physical Exam  Constitutional:  oriented to person, place, and time. appears well-developed and well-nourished. No distress.  HENT: [place]/AT, PERRLA, no scleral icterus Mouth/Throat: Oropharynx is clear and moist. No oropharyngeal exudate.  Cardiovascular: Normal rate, regular rhythm and normal heart sounds. Exam reveals no gallop and no friction rub.  No murmur heard.  Pulmonary/Chest: Effort normal and breath sounds normal. No respiratory distress.  has no wheezes.  Neck = supple, no nuchal rigidity Abdominal: Soft. Bowel sounds are normal.  exhibits no distension. There is no tenderness.  Lymphadenopathy: no cervical adenopathy. No axillary adenopathy Neurological: alert and oriented to person, place, and time.  Skin: Skin is warm and dry. No rash noted. No erythema.  Psychiatric: a normal mood and affect.  behavior is normal.   LABS: Results for orders placed or performed during the hospital encounter of 12/05/24 (from the past 48 hours)  CBC     Status: Abnormal   Collection Time: 12/05/24  3:44 AM  Result Value Ref Range  WBC 18.4 (H) 4.0 - 10.5 K/uL   RBC 5.87 (H) 3.87 - 5.11 MIL/uL   Hemoglobin 13.5 12.0 - 15.0 g/dL   HCT 56.2 63.9 - 53.9 %   MCV 74.4 (L) 80.0 - 100.0 fL   MCH 23.0 (L) 26.0 - 34.0 pg   MCHC 30.9 30.0 - 36.0 g/dL   RDW 79.7 (H) 88.4 - 84.4 %   Platelets 173 150 - 400 K/uL   nRBC 0.0 0.0 - 0.2 %    Comment: Performed at [facility], [address] [place], [place] [address]  Basic metabolic panel     Status: Abnormal   Collection Time: 12/05/24  3:44 AM  Result Value Ref Range   Sodium 134 (L) 135 - 145 mmol/L   Potassium 5.1 3.5 - 5.1 mmol/L   Chloride 99 98 -  111 mmol/L   CO2 19 (L) 22 - 32 mmol/L   Glucose, Bld 342 (H) 70 - 99 mg/dL    Comment: Glucose reference range applies only to samples taken after fasting for at least 8 hours.   BUN 21 8 - 23 mg/dL   Creatinine, Ser 8.69 (H) 0.44 - 1.00 mg/dL   Calcium  9.3 8.9 - 10.3 mg/dL   GFR, Estimated 44 (L) >60 mL/min    Comment: (NOTE) Calculated using the CKD-EPI Creatinine Equation (2021)    Anion gap 15 5 - 15    Comment: Performed at [facility], [address], [place], [place] [address]  Pro Brain natriuretic peptide     Status: Abnormal   Collection Time: 12/05/24  3:44 AM  Result Value Ref Range   Pro Brain Natriuretic Peptide 29,984.0 (H) <300.0 pg/mL    Comment: (NOTE) Age Group        Cut-Points    Interpretation  < 50 years     450 pg/mL       NT-proBNP > 450 pg/mL indicates                                ADHF is likely              50 to 75 years  900 pg/mL      NT-proBNP > 900 pg/mL indicates          ADHF is likely  > 75 years      1800 pg/mL     NT-proBNP > 1800 pg/mL indicates          ADHF is likely                           All ages    Results between       Indeterminate. Further clinical             300 and the cut-   information is needed to determine            point for age group   if ADHF is present.                                                             Elecsys proBNP II/ Elecsys proBNP II STAT  Cut-Point                       Interpretation  300 pg/mL                    NT-proBNP <300pg/mL indicates                             ADHF is not likely  Performed at [facility], [address], [place], [place] [address]   Resp panel by RT-PCR (RSV, Flu A&B, Covid) Anterior Nasal Swab     Status: Abnormal   Collection Time: 12/05/24  3:44 AM   Specimen: Anterior Nasal Swab  Result Value Ref Range   SARS Coronavirus 2 by RT PCR NEGATIVE NEGATIVE    Comment: (NOTE) SARS-CoV-2 target nucleic acids are NOT  DETECTED.  The SARS-CoV-2 RNA is generally detectable in upper respiratory specimens during the acute phase of infection. The lowest concentration of SARS-CoV-2 viral copies this assay can detect is 138 copies/mL. A negative result does not preclude SARS-Cov-2 infection and should not be used as the sole basis for treatment or other patient management decisions. A negative result may occur with  improper specimen collection/handling, submission of specimen other than nasopharyngeal swab, presence of viral mutation(s) within the areas targeted by this assay, and inadequate number of viral copies(<138 copies/mL). A negative result must be combined with clinical observations, patient history, and epidemiological information. The expected result is Negative.  Fact Sheet for Patients:  bloggercourse.com  Fact Sheet for Healthcare Providers:  seriousbroker.it  This test is no t yet approved or cleared by the [place]  FDA and  has been authorized for detection and/or diagnosis of SARS-CoV-2 by FDA under an Emergency Use Authorization (EUA). This EUA will remain  in effect (meaning this test can be used) for the duration of the COVID-19 declaration under Section 564(b)(1) of the Act, 21 U.S.C.section 360bbb-3(b)(1), unless the authorization is terminated  or revoked sooner.       Influenza A by PCR POSITIVE (A) NEGATIVE   Influenza B by PCR NEGATIVE NEGATIVE    Comment: (NOTE) The Xpert Xpress SARS-CoV-2/FLU/RSV plus assay is intended as an aid in the diagnosis of influenza from Nasopharyngeal swab specimens and should not be used as a sole basis for treatment. Nasal washings and aspirates are unacceptable for Xpert Xpress SARS-CoV-2/FLU/RSV testing.  Fact Sheet for Patients: bloggercourse.com  Fact Sheet for Healthcare Providers: seriousbroker.it  This test is not yet approved  or cleared by the [place]  FDA and has been authorized for detection and/or diagnosis of SARS-CoV-2 by FDA under an Emergency Use Authorization (EUA). This EUA will remain in effect (meaning this test can be used) for the duration of the COVID-19 declaration under Section 564(b)(1) of the Act, 21 U.S.C. section 360bbb-3(b)(1), unless the authorization is terminated or revoked.     Resp Syncytial Virus by PCR NEGATIVE NEGATIVE    Comment: (NOTE) Fact Sheet for Patients: bloggercourse.com  Fact Sheet for Healthcare Providers: seriousbroker.it  This test is not yet approved or cleared by the [place]  FDA and has been authorized for detection and/or diagnosis of SARS-CoV-2 by FDA under an Emergency Use Authorization (EUA). This EUA will remain in effect (meaning this test can be used) for the duration of the COVID-19 declaration under Section 564(b)(1) of the Act, 21 U.S.C. section 360bbb-3(b)(1), unless the authorization is terminated or revoked.  Performed  at [facility], [address], [place], [place] [address]   Protime-INR     Status: Abnormal   Collection Time: 12/05/24  3:59 AM  Result Value Ref Range   Prothrombin Time 25.6 (H) 11.4 - 15.2 seconds   INR 2.2 (H) 0.8 - 1.2    Comment: (NOTE) INR goal varies based on device and disease states. Performed at [facility], [address] [place], [place] [address]   Blood culture (routine x 2)     Status: Abnormal (Preliminary result)   Collection Time: 12/05/24  4:05 AM   Specimen: BLOOD RIGHT WRIST  Result Value Ref Range   Specimen Description      BLOOD RIGHT WRIST Performed at [facility], [address], [place], [place] [address]    Special Requests      BOTTLES DRAWN AEROBIC AND ANAEROBIC Blood Culture results may not be optimal due to an inadequate volume of blood received in culture bottles Performed at  [facility], [address] [place], [place] [address]    Culture  Setup Time      GRAM POSITIVE COCCI IN PAIRS IN BOTH AEROBIC AND ANAEROBIC BOTTLES CRITICAL RESULT CALLED TO, READ BACK BY AND VERIFIED WITH: PHARMD M. BELL [id number] @ 2322 FH    Culture (A)     STREPTOCOCCUS PNEUMONIAE SUSCEPTIBILITIES TO FOLLOW Performed at [facility], [address], [place], [place] [address]    Report Status PENDING   Blood Culture ID Panel (Reflexed)     Status: Abnormal   Collection Time: 12/05/24  4:05 AM  Result Value Ref Range   Enterococcus faecalis NOT DETECTED NOT DETECTED   Enterococcus Faecium NOT DETECTED NOT DETECTED   Listeria monocytogenes NOT DETECTED NOT DETECTED   Staphylococcus species NOT DETECTED NOT DETECTED   Staphylococcus aureus (BCID) NOT DETECTED NOT DETECTED   Staphylococcus epidermidis NOT DETECTED NOT DETECTED   Staphylococcus lugdunensis NOT DETECTED NOT DETECTED   Streptococcus species DETECTED (A) NOT DETECTED    Comment: CRITICAL RESULT CALLED TO, READ BACK BY AND VERIFIED WITH: PHARMD M. BELL [id number] @ 2322 FH    Streptococcus agalactiae NOT DETECTED NOT DETECTED   Streptococcus pneumoniae DETECTED (A) NOT DETECTED    Comment: CRITICAL RESULT CALLED TO, READ BACK BY AND VERIFIED WITH: PHARMD M. BELL [id number] @ 2322 FH    Streptococcus pyogenes NOT DETECTED NOT DETECTED   A.calcoaceticus-baumannii NOT DETECTED NOT DETECTED   Bacteroides fragilis NOT DETECTED NOT DETECTED   Enterobacterales NOT DETECTED NOT DETECTED   Enterobacter cloacae complex NOT DETECTED NOT DETECTED   Escherichia coli NOT DETECTED NOT DETECTED   Klebsiella aerogenes NOT DETECTED NOT DETECTED   Klebsiella oxytoca NOT DETECTED NOT DETECTED   Klebsiella pneumoniae NOT DETECTED NOT DETECTED   Proteus species NOT DETECTED NOT DETECTED   Salmonella species NOT DETECTED NOT DETECTED   Serratia marcescens NOT DETECTED NOT DETECTED   Haemophilus influenzae NOT  DETECTED NOT DETECTED   Neisseria meningitidis NOT DETECTED NOT DETECTED   Pseudomonas aeruginosa NOT DETECTED NOT DETECTED   Stenotrophomonas maltophilia NOT DETECTED NOT DETECTED   Candida albicans NOT DETECTED NOT DETECTED   Candida auris NOT DETECTED NOT DETECTED   Candida glabrata NOT DETECTED NOT DETECTED   Candida krusei NOT DETECTED NOT DETECTED   Candida parapsilosis NOT DETECTED NOT DETECTED   Candida tropicalis NOT DETECTED NOT DETECTED   Cryptococcus neoformans/gattii NOT DETECTED NOT DETECTED    Comment: Performed at [facility], [address], [place], [place] [address]  I-Stat CG4 Lactic Acid     Status: Abnormal   Collection Time: 12/05/24  4:09 AM  Result Value Ref Range   Lactic Acid, Venous 2.5 (HH) 0.5 - 1.9 mmol/L   Comment NOTIFIED PHYSICIAN   I-Stat CG4 Lactic Acid     Status: Abnormal   Collection Time: 12/05/24  6:38 AM  Result Value Ref Range   Lactic Acid, Venous 2.5 (HH) 0.5 - 1.9 mmol/L   Comment NOTIFIED PHYSICIAN   CBG monitoring, ED     Status: Abnormal   Collection Time: 12/05/24  8:28 AM  Result Value Ref Range   Glucose-Capillary 326 (H) 70 - 99 mg/dL    Comment: Glucose reference range applies only to samples taken after fasting for at least 8 hours.  CBG monitoring, ED     Status: Abnormal   Collection Time: 12/05/24 12:23 PM  Result Value Ref Range   Glucose-Capillary 298 (H) 70 - 99 mg/dL    Comment: Glucose reference range applies only to samples taken after fasting for at least 8 hours.  Blood culture (routine x 2)     Status: None (Preliminary result)   Collection Time: 12/05/24  3:13 PM   Specimen: BLOOD RIGHT ARM  Result Value Ref Range   Specimen Description      BLOOD RIGHT ARM Performed at [facility], [address] [place], [place] [address]    Special Requests      BOTTLES DRAWN AEROBIC ONLY Blood Culture results may not be optimal due to an inadequate volume of blood received in culture bottles Performed at  [facility], [address], [place], [place] [address]    Culture      NO GROWTH < 24 HOURS Performed at [facility], [address] [place], [place] [address]    Report Status PENDING   Glucose, capillary     Status: Abnormal   Collection Time: 12/05/24  3:57 PM  Result Value Ref Range   Glucose-Capillary 269 (H) 70 - 99 mg/dL    Comment: Glucose reference range applies only to samples taken after fasting for at least 8 hours.  Glucose, capillary     Status: Abnormal   Collection Time: 12/05/24  8:12 PM  Result Value Ref Range   Glucose-Capillary 117 (H) 70 - 99 mg/dL    Comment: Glucose reference range applies only to samples taken after fasting for at least 8 hours.  Basic metabolic panel     Status: Abnormal   Collection Time: 12/06/24  4:26 AM  Result Value Ref Range   Sodium 138 135 - 145 mmol/L   Potassium 4.5 3.5 - 5.1 mmol/L   Chloride 105 98 - 111 mmol/L   CO2 22 22 - 32 mmol/L   Glucose, Bld 127 (H) 70 - 99 mg/dL    Comment: Glucose reference range applies only to samples taken after fasting for at least 8 hours.   BUN 35 (H) 8 - 23 mg/dL   Creatinine, Ser 8.56 (H) 0.44 - 1.00 mg/dL   Calcium  8.6 (L) 8.9 - 10.3 mg/dL   GFR, Estimated 40 (L) >60 mL/min    Comment: (NOTE) Calculated using the CKD-EPI Creatinine Equation (2021)    Anion gap 11 5 - 15    Comment: Performed at [facility], [address] [place], [place] [address]  CBC     Status: Abnormal   Collection  Time: 12/06/24  4:26 AM  Result Value Ref Range   WBC 15.9 (H) 4.0 - 10.5 K/uL   RBC 5.25 (H) 3.87 - 5.11 MIL/uL   Hemoglobin 11.9 (L) 12.0 - 15.0 g/dL   HCT 61.2 63.9 - 53.9 %   MCV 73.7 (L) 80.0 - 100.0 fL   MCH 22.7 (L) 26.0 - 34.0 pg   MCHC 30.7 30.0 - 36.0 g/dL   RDW 80.5 (H) 88.4 - 84.4 %   Platelets 160 150 - 400 K/uL   nRBC 0.0 0.0 - 0.2 %    Comment: Performed at [facility], [address], [place], [place] [address]   Pro Brain natriuretic peptide     Status: Abnormal   Collection Time: 12/06/24  4:26 AM  Result Value Ref Range   Pro Brain Natriuretic Peptide 29,685.0 (H) <300.0 pg/mL    Comment: (NOTE) Age Group        Cut-Points    Interpretation  < 50 years     450 pg/mL       NT-proBNP > 450 pg/mL indicates                                ADHF is likely              50 to 75 years  900 pg/mL      NT-proBNP > 900 pg/mL indicates          ADHF is likely  > 75 years      1800 pg/mL     NT-proBNP > 1800 pg/mL indicates          ADHF is likely                           All ages    Results between       Indeterminate. Further clinical             300 and the cut-   information is needed to determine            point for age group   if ADHF is present.                                                             Elecsys proBNP II/ Elecsys proBNP II STAT           Cut-Point                       Interpretation  300 pg/mL                    NT-proBNP <300pg/mL indicates                             ADHF is not likely  Performed at [facility], [address] [place], [place] [address]   Protime-INR     Status: Abnormal   Collection Time: 12/06/24  4:26 AM  Result Value Ref Range   Prothrombin Time 29.5 (H) 11.4 - 15.2 seconds   INR 2.7 (H) 0.8 - 1.2    Comment: (NOTE) INR  goal varies based on device and disease states. Performed at [facility], [address], [place], [place] [address]   Glucose, capillary     Status: Abnormal   Collection Time: 12/06/24  7:57 AM  Result Value Ref Range   Glucose-Capillary 118 (H) 70 - 99 mg/dL    Comment: Glucose reference range applies only to samples taken after fasting for at least 8 hours.  Glucose, capillary     Status: Abnormal   Collection Time: 12/06/24 11:42 AM  Result Value Ref Range   Glucose-Capillary 169 (H) 70 - 99 mg/dL    Comment: Glucose reference range applies only to samples taken after  fasting for at least 8 hours.    MICRO:  IMAGING: DG Chest Portable 1 View Result Date: 12/05/2024 EXAM: 1 VIEW(S) XRAY OF THE CHEST 12/05/2024 03:31:00 AM COMPARISON: 09/15/2024 CLINICAL HISTORY: sob FINDINGS: LUNGS AND PLEURA: Perihilar and interstitial opacities. Possible left pleural effusion. No pneumothorax. HEART AND MEDIASTINUM: Cardiomegaly. Left subclavian cardiac rhythm device noted. BONES AND SOFT TISSUES: Median sternotomy noted. No acute osseous abnormality. IMPRESSION: 1. Cardiomegaly with left subclavian cardiac rhythm device, similar to 09/15/2024. 2. Perihilar and interstitial opacities, which may reflect pulmonary edema or pneumonia. 3. Possible left pleural effusion. Electronically signed by: Evalene Coho MD 12/05/2024 04:08 AM EST RP Workstation: [id number]    Assessment/Plan:  69yo F with influenza A and secondary strep pneumonia and streptococcal bacteremia   For influenza A= finish a 5 day course of oseltamivir   Secondary strep pnemonia plus bacteremia = continue on ceftriaxone  2gm IV daily. Can stop azithromycin . If continues to be afebrile, and improving for IV abtx, can transition to oral abtx to finish out course 7 day course. Susceptibilities out tomorrow.will repeat blood cx tomorrow  Hx of cardiac device = recommend to get TTE to evaluate for endocarditis  Ckd 3 = at baseline  Lower extremity edema 2/2 fluid overload = receiving addn doses of diuretics   Continue on droplet isolation. Plan discussed with dr sebastian  evaluation of this patient requires complex antimicrobial therapy evaluation and counseling and isolation needs for disease transmission risk assessment and mitigation.      [1]  Allergies Allergen Reactions   Keflex  [Cephalexin ] Rash    Skin on feet peeled.   Asa [Aspirin ]     325 mg ----heartburn Pt can take 81 mg   Enalapril Cough   Lisinopril  Cough  [2]  Social History Tobacco Use   Smoking status: Never   Smokeless tobacco:  Never  Vaping Use   Vaping status: Never Used  Substance Use Topics   Alcohol use: No   Drug use: Yes    Types: Marijuana    Comment: In her 50 's   "

## 2024-12-06 NOTE — Plan of Care (Signed)
  Problem: Education: Goal: Ability to describe self-care measures that may prevent or decrease complications (Diabetes Survival Skills Education) will improve Outcome: Progressing Goal: Individualized Educational Video(s) Outcome: Progressing   Problem: Fluid Volume: Goal: Ability to maintain a balanced intake and output will improve Outcome: Progressing   Problem: Health Behavior/Discharge Planning: Goal: Ability to identify and utilize available resources and services will improve Outcome: Progressing Goal: Ability to manage health-related needs will improve Outcome: Progressing

## 2024-12-06 NOTE — Progress Notes (Signed)
 PHARMACY - PHYSICIAN COMMUNICATION CRITICAL VALUE ALERT - BLOOD CULTURE IDENTIFICATION (BCID)  Rachael Davis is an 69 y.o. female who presented to [facility] on 12/05/2024 with a chief complaint of shortness of breath.  Assessment:   Admit with severe sepsis secondary to influenza A and pneumonia.  Blood cx growing GPC in pairs; BCID + Strep pneumoniae.  Name of physician (or Provider) Contacted: DELENA Horns, NP  Current antibiotics: Rocephin  1gm + Zithromax   Changes to prescribed antibiotics recommended:  Increased Rocephin  2gm Consider d/c Zithromax  or minimize duration.  Results for orders placed or performed during the hospital encounter of 12/05/24  Blood Culture ID Panel (Reflexed) (Collected: 12/05/2024  4:05 AM)  Result Value Ref Range   Enterococcus faecalis NOT DETECTED NOT DETECTED   Enterococcus Faecium NOT DETECTED NOT DETECTED   Listeria monocytogenes NOT DETECTED NOT DETECTED   Staphylococcus species NOT DETECTED NOT DETECTED   Staphylococcus aureus (BCID) NOT DETECTED NOT DETECTED   Staphylococcus epidermidis NOT DETECTED NOT DETECTED   Staphylococcus lugdunensis NOT DETECTED NOT DETECTED   Streptococcus species DETECTED (A) NOT DETECTED   Streptococcus agalactiae NOT DETECTED NOT DETECTED   Streptococcus pneumoniae DETECTED (A) NOT DETECTED   Streptococcus pyogenes NOT DETECTED NOT DETECTED   A.calcoaceticus-baumannii NOT DETECTED NOT DETECTED   Bacteroides fragilis NOT DETECTED NOT DETECTED   Enterobacterales NOT DETECTED NOT DETECTED   Enterobacter cloacae complex NOT DETECTED NOT DETECTED   Escherichia coli NOT DETECTED NOT DETECTED   Klebsiella aerogenes NOT DETECTED NOT DETECTED   Klebsiella oxytoca NOT DETECTED NOT DETECTED   Klebsiella pneumoniae NOT DETECTED NOT DETECTED   Proteus species NOT DETECTED NOT DETECTED   Salmonella species NOT DETECTED NOT DETECTED   Serratia marcescens NOT DETECTED NOT DETECTED   Haemophilus influenzae NOT DETECTED NOT  DETECTED   Neisseria meningitidis NOT DETECTED NOT DETECTED   Pseudomonas aeruginosa NOT DETECTED NOT DETECTED   Stenotrophomonas maltophilia NOT DETECTED NOT DETECTED   Candida albicans NOT DETECTED NOT DETECTED   Candida auris NOT DETECTED NOT DETECTED   Candida glabrata NOT DETECTED NOT DETECTED   Candida krusei NOT DETECTED NOT DETECTED   Candida parapsilosis NOT DETECTED NOT DETECTED   Candida tropicalis NOT DETECTED NOT DETECTED   Cryptococcus neoformans/gattii NOT DETECTED NOT DETECTED    Rosaline Millet PharmD 12/06/2024  12:18 AM

## 2024-12-07 ENCOUNTER — Inpatient Hospital Stay (HOSPITAL_COMMUNITY)

## 2024-12-07 DIAGNOSIS — I38 Endocarditis, valve unspecified: Secondary | ICD-10-CM

## 2024-12-07 DIAGNOSIS — J09X1 Influenza due to identified novel influenza A virus with pneumonia: Secondary | ICD-10-CM | POA: Diagnosis not present

## 2024-12-07 DIAGNOSIS — E1122 Type 2 diabetes mellitus with diabetic chronic kidney disease: Secondary | ICD-10-CM | POA: Diagnosis not present

## 2024-12-07 DIAGNOSIS — J189 Pneumonia, unspecified organism: Secondary | ICD-10-CM | POA: Diagnosis not present

## 2024-12-07 DIAGNOSIS — I5021 Acute systolic (congestive) heart failure: Secondary | ICD-10-CM | POA: Diagnosis not present

## 2024-12-07 LAB — BASIC METABOLIC PANEL WITH GFR
Anion gap: 13 (ref 5–15)
BUN: 36 mg/dL — ABNORMAL HIGH (ref 8–23)
CO2: 24 mmol/L (ref 22–32)
Calcium: 8.7 mg/dL — ABNORMAL LOW (ref 8.9–10.3)
Chloride: 103 mmol/L (ref 98–111)
Creatinine, Ser: 1.47 mg/dL — ABNORMAL HIGH (ref 0.44–1.00)
GFR, Estimated: 38 mL/min — ABNORMAL LOW
Glucose, Bld: 113 mg/dL — ABNORMAL HIGH (ref 70–99)
Potassium: 4.1 mmol/L (ref 3.5–5.1)
Sodium: 139 mmol/L (ref 135–145)

## 2024-12-07 LAB — CULTURE, BLOOD (ROUTINE X 2)

## 2024-12-07 LAB — CBC WITH DIFFERENTIAL/PLATELET
Abs Immature Granulocytes: 0.07 K/uL (ref 0.00–0.07)
Basophils Absolute: 0 K/uL (ref 0.0–0.1)
Basophils Relative: 0 %
Eosinophils Absolute: 0.1 K/uL (ref 0.0–0.5)
Eosinophils Relative: 1 %
HCT: 39.9 % (ref 36.0–46.0)
Hemoglobin: 12.3 g/dL (ref 12.0–15.0)
Immature Granulocytes: 1 %
Lymphocytes Relative: 12 %
Lymphs Abs: 1.2 K/uL (ref 0.7–4.0)
MCH: 22.6 pg — ABNORMAL LOW (ref 26.0–34.0)
MCHC: 30.8 g/dL (ref 30.0–36.0)
MCV: 73.3 fL — ABNORMAL LOW (ref 80.0–100.0)
Monocytes Absolute: 0.6 K/uL (ref 0.1–1.0)
Monocytes Relative: 6 %
Neutro Abs: 8.3 K/uL — ABNORMAL HIGH (ref 1.7–7.7)
Neutrophils Relative %: 80 %
Platelets: 191 K/uL (ref 150–400)
RBC: 5.44 MIL/uL — ABNORMAL HIGH (ref 3.87–5.11)
RDW: 19.8 % — ABNORMAL HIGH (ref 11.5–15.5)
WBC: 10.3 K/uL (ref 4.0–10.5)
nRBC: 0 % (ref 0.0–0.2)

## 2024-12-07 LAB — ECHOCARDIOGRAM COMPLETE
Area-P 1/2: 3.61 cm2
Calc EF: 10.6 %
Est EF: <20
Height: 62 in
MV VTI: 0.92 cm2
S' Lateral: 5.6 cm
Single Plane A2C EF: 3.9 %
Single Plane A4C EF: 16.6 %
Weight: 2687.85 [oz_av]

## 2024-12-07 LAB — GLUCOSE, CAPILLARY
Glucose-Capillary: 118 mg/dL — ABNORMAL HIGH (ref 70–99)
Glucose-Capillary: 232 mg/dL — ABNORMAL HIGH (ref 70–99)
Glucose-Capillary: 233 mg/dL — ABNORMAL HIGH (ref 70–99)
Glucose-Capillary: 193 mg/dL — ABNORMAL HIGH (ref 70–99)

## 2024-12-07 LAB — MAGNESIUM: Magnesium: 2.5 mg/dL — ABNORMAL HIGH (ref 1.7–2.4)

## 2024-12-07 LAB — PROTIME-INR
INR: 2.4 — ABNORMAL HIGH (ref 0.8–1.2)
Prothrombin Time: 27.3 s — ABNORMAL HIGH (ref 11.4–15.2)

## 2024-12-07 LAB — LACTIC ACID, PLASMA: Lactic Acid, Venous: 1.7 mmol/L (ref 0.5–1.9)

## 2024-12-07 MED ORDER — ALBUTEROL SULFATE (2.5 MG/3ML) 0.083% IN NEBU
2.5000 mg | INHALATION_SOLUTION | Freq: Three times a day (TID) | RESPIRATORY_TRACT | Status: DC
  Administered 2024-12-07 – 2024-12-09 (×4): 2.5 mg via RESPIRATORY_TRACT
  Filled 2024-12-07 (×4): qty 3

## 2024-12-07 MED ORDER — WARFARIN SODIUM 5 MG PO TABS
5.0000 mg | ORAL_TABLET | Freq: Once | ORAL | Status: AC
  Administered 2024-12-07: 5 mg via ORAL
  Filled 2024-12-07: qty 1

## 2024-12-07 MED ORDER — PERFLUTREN LIPID MICROSPHERE
1.0000 mL | INTRAVENOUS | Status: AC | PRN
  Administered 2024-12-07: 2 mL via INTRAVENOUS

## 2024-12-07 MED ORDER — GUAIFENESIN-DM 100-10 MG/5ML PO SYRP
5.0000 mL | ORAL_SOLUTION | ORAL | Status: DC | PRN
  Administered 2024-12-07 – 2024-12-10 (×6): 5 mL via ORAL
  Filled 2024-12-07 (×6): qty 10

## 2024-12-07 NOTE — Progress Notes (Signed)
 " PROGRESS NOTE    Rachael Davis  FMW:[id number] DOB: 1956-07-24 DOA: 12/05/2024 PCP: Sun, Vyvyan, MD    Chief Complaint  Patient presents with   Cough   Shortness of Breath    Brief Narrative:  Rachael Davis is a 69 y.o. female with medical history significant for atrial fibrillation on Coumadin , chronic systolic congestive heart failure with EF 20%, diabetes, hypertension being admitted to the hospital with evidence of acute heart failure in the setting of influenza A infection, pneumonia, Streptococcus bacteremia.  Cardiology, ID consulted.   Assessment & Plan:   Principal Problem:   Acute CHF (congestive heart failure) (HCC) Active Problems:   Paroxysmal atrial fibrillation (HCC)   Acute systolic (congestive) heart failure (HCC)   Bacteremia due to Streptococcus pneumoniae   Community acquired pneumonia   Influenza A with pneumonia   Severe sepsis (HCC)   S/P mitral valve replacement with bioprosthetic valve   Type 2 diabetes mellitus with stage 3a chronic kidney disease, without long-term current use of insulin  (HCC)  #1 acute on chronic systolic heart failure -Patient presented with lower extremity edema, orthopnea felt likely secondary to influenza A, medication noncompliance, elevated proBNP of 29,984. - Patient with known EF <20% with left ventricular global hypokinesis (2D echo 09/17/2024). - Patient on Lasix  60 mg IV every 12 hours with urine output of 350 cc recorded over the past 24 hours. - Patient still volume overloaded on examination. - Patient seen in consultation by cardiology IV Lasix  dose increased to 80 mg every 12 hours. -Urine output of 2.4 L over the past 24 hours. -Patient is -2.3 L during this hospitalization. - 2D echo ordered to rule out endocarditis as patient with a Streptococcus bacteremia and ICD. - Continue carvedilol  12.5 mg twice daily, low-dose Entresto . - Imdur  discontinued by cardiology due to soft blood pressure and to allow  diuresis. - Cardiology recommend to hold Farxiga  in the setting of bacteremia. - Cardiology following appreciate the input and recommendations.  2.  Severe sepsis secondary to influenza A, probable pneumonia, Streptococcus bacteremia. - Patient met criteria for sepsis on admission with leukocytosis, tachycardia, endorgan dysfunction with a lactic of 2.5. - Source of sepsis felt to be multifactorial secondary to influenza A, suspected community-acquired pneumonia, Streptococcus bacteremia. - Patient not received fluid boluses due acute CHF exacerbation with a baseline EF <20%. - Continue IV Rocephin , Tamiflu .  - 2D echo ordered to rule out vegetations as patient noted with ICD. - ID consulted for further evaluation and management.  3.  Influenza A -Patient noted to have presented with cough, dyspnea, orthopnea. - Patient with no hypoxia. - Tamiflu .   4.  Community-acquired pneumonia  -Patient with leukocytosis, productive cough, chest x-ray concerning for pneumonia. - Blood cultures with Streptococcus bacteremia. - Azithromycin  discontinued. - IV Rocephin .   5.  Streptococcus pneumoniae bacteremia - May have seeded from probable pneumonia. - Patient status post ICD concern for possible vegetations. - 2D echo ordered. - ID consulted and repeat blood cultures ordered and pending. - Continue IV Rocephin . - ID following.  6.  CKD stage IIIa -Renal function at baseline. - Monitor with diuresis and Entresto .  7.  Hypertension - Continue carvedilol , Entresto , IV Lasix .  - Imdur  discontinued per cardiology due to soft blood pressure and to allow room for diuresis.    8.  Diabetes mellitus type 2 -Hemoglobin A1c 6.6 (09/16/2024) - CBG 118 this morning. - Farxiga  discontinued due to acute infection with bacteremia. - SSI.  9.  Paroxysmal A-fib/bioprosthetic mitral valve replacement - Continue carvedilol  for rate control.   - Coumadin  for anticoagulation.   10.  History of VT  status post [employer] ICD -No VT noted on telemetry. - Patient being followed by cardiology, concern for seeding of device with bacteremia. - 2D echo ordered. - Per cardiology.   DVT prophylaxis: Coumadin  Code Status: Full Family Communication: Updated patient.  No family at bedside. Disposition: Home when clinically improved and cleared by ID and cardiology.  Status is: Inpatient Remains inpatient appropriate because: Severity of disease.   Consultants:  Cardiology: Dr. Jeffrie 12/05/2024 ID: Dr. Luiz 12/06/2024  Procedures:  Chest x-ray 12/05/2024   Antimicrobials:  Anti-infectives (From admission, onward)    Start     Dose/Rate Route Frequency Ordered Stop   12/06/24 0800  azithromycin  (ZITHROMAX ) 500 mg in sodium chloride  0.9 % 250 mL IVPB  Status:  Discontinued        500 mg 250 mL/hr over 60 Minutes Intravenous Every 24 hours 12/05/24 0754 12/06/24 0604   12/06/24 0800  cefTRIAXone  (ROCEPHIN ) 1 g in sodium chloride  0.9 % 100 mL IVPB  Status:  Discontinued        1 g 200 mL/hr over 30 Minutes Intravenous Every 24 hours 12/05/24 0754 12/05/24 2323   12/05/24 2324  cefTRIAXone  (ROCEPHIN ) 2 g in sodium chloride  0.9 % 100 mL IVPB        2 g 200 mL/hr over 30 Minutes Intravenous Daily at bedtime 12/05/24 2323     12/05/24 1800  oseltamivir  (TAMIFLU ) capsule 30 mg        30 mg Oral 2 times daily 12/05/24 0747 12/10/24 0759   12/05/24 0600  oseltamivir  (TAMIFLU ) capsule 75 mg  Status:  Discontinued        75 mg Oral  Once 12/05/24 0553 12/05/24 0554   12/05/24 0600  oseltamivir  (TAMIFLU ) capsule 30 mg        30 mg Oral  Once 12/05/24 0554 12/05/24 0624   12/05/24 0430  cefTRIAXone  (ROCEPHIN ) 1 g in sodium chloride  0.9 % 100 mL IVPB        1 g 200 mL/hr over 30 Minutes Intravenous  Once 12/05/24 0424 12/05/24 0503   12/05/24 0430  azithromycin  (ZITHROMAX ) 500 mg in sodium chloride  0.9 % 250 mL IVPB        500 mg 250 mL/hr over 60 Minutes Intravenous  Once 12/05/24 0424  12/05/24 0606         Subjective: Patient sitting up in recliner.  Denies any chest pain.  No abdominal pain.  Feels some improvement with shortness of breath.  Stated had better urine output today that she did yesterday.   Objective: Vitals:   12/07/24 0500 12/07/24 0552 12/07/24 0816 12/07/24 0934  BP:  107/74 118/67   Pulse:  79 (!) 59   Resp:  20    Temp:  97.8 F (36.6 C)    TempSrc:  Oral    SpO2:  100% 100% 100%  Weight: 76.2 kg     Height:        Intake/Output Summary (Last 24 hours) at 12/07/2024 1245 Last data filed at 12/07/2024 1124 Gross per 24 hour  Intake 560.11 ml  Output 2300 ml  Net -1739.89 ml   Filed Weights   12/05/24 0437 12/07/24 0500  Weight: 81.2 kg 76.2 kg    Examination:  General exam: NAD. Respiratory system: Some bibasilar crackles.  No wheezing, no rhonchi, fair air movement.  No use  of accessory muscles of respiration. Cardiovascular system: Irregularly irregular.  No murmurs rubs or gallops.  2+ bilateral lower extremity edema.  Gastrointestinal system: Abdomen is soft, nontender, nondistended, positive bowel sounds.  No rebound.  No guarding.  Central nervous system: Alert and oriented. No focal neurological deficits. Extremities: 2+ bilateral lower extremity edema.  Symmetric 5 x 5 power. Skin: No rashes, lesions or ulcers Psychiatry: Judgement and insight appear normal. Mood & affect appropriate.     Data Reviewed: I have personally reviewed following labs and imaging studies  CBC: Recent Labs  Lab 12/05/24 0344 12/06/24 0426 12/07/24 0434  WBC 18.4* 15.9* 10.3  NEUTROABS  --   --  8.3*  HGB 13.5 11.9* 12.3  HCT 43.7 38.7 39.9  MCV 74.4* 73.7* 73.3*  PLT 173 160 191    Basic Metabolic Panel: Recent Labs  Lab 12/05/24 0344 12/06/24 0426 12/07/24 0434  NA 134* 138 139  K 5.1 4.5 4.1  CL 99 105 103  CO2 19* 22 24  GLUCOSE 342* 127* 113*  BUN 21 35* 36*  CREATININE 1.30* 1.43* 1.47*  CALCIUM  9.3 8.6* 8.7*  MG  --    --  2.5*    GFR: Estimated Creatinine Clearance: 34.5 mL/min (A) (by C-G formula based on SCr of 1.47 mg/dL (H)).  Liver Function Tests: No results for input(s): AST, ALT, ALKPHOS, BILITOT, PROT, ALBUMIN  in the last 168 hours.  CBG: Recent Labs  Lab 12/06/24 1142 12/06/24 1647 12/06/24 2123 12/07/24 0735 12/07/24 1116  GLUCAP 169* 183* 266* 118* 232*     Recent Results (from the past 240 hours)  Resp panel by RT-PCR (RSV, Flu A&B, Covid) Anterior Nasal Swab     Status: Abnormal   Collection Time: 12/05/24  3:44 AM   Specimen: Anterior Nasal Swab  Result Value Ref Range Status   SARS Coronavirus 2 by RT PCR NEGATIVE NEGATIVE Final    Comment: (NOTE) SARS-CoV-2 target nucleic acids are NOT DETECTED.  The SARS-CoV-2 RNA is generally detectable in upper respiratory specimens during the acute phase of infection. The lowest concentration of SARS-CoV-2 viral copies this assay can detect is 138 copies/mL. A negative result does not preclude SARS-Cov-2 infection and should not be used as the sole basis for treatment or other patient management decisions. A negative result may occur with  improper specimen collection/handling, submission of specimen other than nasopharyngeal swab, presence of viral mutation(s) within the areas targeted by this assay, and inadequate number of viral copies(<138 copies/mL). A negative result must be combined with clinical observations, patient history, and epidemiological information. The expected result is Negative.  Fact Sheet for Patients:  bloggercourse.com  Fact Sheet for Healthcare Providers:  seriousbroker.it  This test is no t yet approved or cleared by the [place]  FDA and  has been authorized for detection and/or diagnosis of SARS-CoV-2 by FDA under an Emergency Use Authorization (EUA). This EUA will remain  in effect (meaning this test can be used) for the duration  of the COVID-19 declaration under Section 564(b)(1) of the Act, 21 U.S.C.section 360bbb-3(b)(1), unless the authorization is terminated  or revoked sooner.       Influenza A by PCR POSITIVE (A) NEGATIVE Final   Influenza B by PCR NEGATIVE NEGATIVE Final    Comment: (NOTE) The Xpert Xpress SARS-CoV-2/FLU/RSV plus assay is intended as an aid in the diagnosis of influenza from Nasopharyngeal swab specimens and should not be used as a sole basis for treatment. Nasal washings and aspirates are unacceptable  for Xpert Xpress SARS-CoV-2/FLU/RSV testing.  Fact Sheet for Patients: bloggercourse.com  Fact Sheet for Healthcare Providers: seriousbroker.it  This test is not yet approved or cleared by the [place]  FDA and has been authorized for detection and/or diagnosis of SARS-CoV-2 by FDA under an Emergency Use Authorization (EUA). This EUA will remain in effect (meaning this test can be used) for the duration of the COVID-19 declaration under Section 564(b)(1) of the Act, 21 U.S.C. section 360bbb-3(b)(1), unless the authorization is terminated or revoked.     Resp Syncytial Virus by PCR NEGATIVE NEGATIVE Final    Comment: (NOTE) Fact Sheet for Patients: bloggercourse.com  Fact Sheet for Healthcare Providers: seriousbroker.it  This test is not yet approved or cleared by the [place]  FDA and has been authorized for detection and/or diagnosis of SARS-CoV-2 by FDA under an Emergency Use Authorization (EUA). This EUA will remain in effect (meaning this test can be used) for the duration of the COVID-19 declaration under Section 564(b)(1) of the Act, 21 U.S.C. section 360bbb-3(b)(1), unless the authorization is terminated or revoked.  Performed at [facility], [address] [place], [place] [address]   Blood culture (routine x 2)     Status: Abnormal  (Preliminary result)   Collection Time: 12/05/24  4:05 AM   Specimen: BLOOD RIGHT WRIST  Result Value Ref Range Status   Specimen Description   Final    BLOOD RIGHT WRIST Performed at [facility], [address] [place], [place] [address]    Special Requests   Final    BOTTLES DRAWN AEROBIC AND ANAEROBIC Blood Culture results may not be optimal due to an inadequate volume of blood received in culture bottles Performed at [facility], [address], [place], [place] [address]    Culture  Setup Time   Final    GRAM POSITIVE COCCI IN PAIRS IN BOTH AEROBIC AND ANAEROBIC BOTTLES CRITICAL RESULT CALLED TO, READ BACK BY AND VERIFIED WITH: PHARMD M. BELL [id number] @ 2322 FH Performed at [facility], [address], [place], [place] [address]    Culture STREPTOCOCCUS PNEUMONIAE (A)  Final   Report Status PENDING  Incomplete   Organism ID, Bacteria STREPTOCOCCUS PNEUMONIAE  Final      Susceptibility   Streptococcus pneumoniae - MIC*    ERYTHROMYCIN <=0.12 SENSITIVE Sensitive     LEVOFLOXACIN  0.5 SENSITIVE Sensitive     VANCOMYCIN  0.25 SENSITIVE Sensitive     PENICILLIN (meningitis) 0.25 RESISTANT Resistant     PENO - penicillin 0.25      PENICILLIN (non-meningitis) 0.25 SENSITIVE Sensitive     PENICILLIN (oral) 0.25 INTERMEDIATE Intermediate     CEFTRIAXONE  (non-meningitis) 0.25 SENSITIVE Sensitive     CEFTRIAXONE  (meningitis) 0.25 SENSITIVE Sensitive     * STREPTOCOCCUS PNEUMONIAE  Blood Culture ID Panel (Reflexed)     Status: Abnormal   Collection Time: 12/05/24  4:05 AM  Result Value Ref Range Status   Enterococcus faecalis NOT DETECTED NOT DETECTED Final   Enterococcus Faecium NOT DETECTED NOT DETECTED Final   Listeria monocytogenes NOT DETECTED NOT DETECTED Final   Staphylococcus species NOT DETECTED NOT DETECTED Final   Staphylococcus aureus (BCID) NOT DETECTED NOT DETECTED Final   Staphylococcus epidermidis NOT DETECTED NOT DETECTED Final    Staphylococcus lugdunensis NOT DETECTED NOT DETECTED Final   Streptococcus species DETECTED (A) NOT DETECTED Final    Comment: CRITICAL RESULT CALLED TO, READ BACK BY AND VERIFIED WITH: PHARMD M. BELL [id number] @  2322 FH    Streptococcus agalactiae NOT DETECTED NOT DETECTED Final   Streptococcus pneumoniae DETECTED (A) NOT DETECTED Final    Comment: CRITICAL RESULT CALLED TO, READ BACK BY AND VERIFIED WITH: PHARMD M. BELL [id number] @ 2322 FH    Streptococcus pyogenes NOT DETECTED NOT DETECTED Final   A.calcoaceticus-baumannii NOT DETECTED NOT DETECTED Final   Bacteroides fragilis NOT DETECTED NOT DETECTED Final   Enterobacterales NOT DETECTED NOT DETECTED Final   Enterobacter cloacae complex NOT DETECTED NOT DETECTED Final   Escherichia coli NOT DETECTED NOT DETECTED Final   Klebsiella aerogenes NOT DETECTED NOT DETECTED Final   Klebsiella oxytoca NOT DETECTED NOT DETECTED Final   Klebsiella pneumoniae NOT DETECTED NOT DETECTED Final   Proteus species NOT DETECTED NOT DETECTED Final   Salmonella species NOT DETECTED NOT DETECTED Final   Serratia marcescens NOT DETECTED NOT DETECTED Final   Haemophilus influenzae NOT DETECTED NOT DETECTED Final   Neisseria meningitidis NOT DETECTED NOT DETECTED Final   Pseudomonas aeruginosa NOT DETECTED NOT DETECTED Final   Stenotrophomonas maltophilia NOT DETECTED NOT DETECTED Final   Candida albicans NOT DETECTED NOT DETECTED Final   Candida auris NOT DETECTED NOT DETECTED Final   Candida glabrata NOT DETECTED NOT DETECTED Final   Candida krusei NOT DETECTED NOT DETECTED Final   Candida parapsilosis NOT DETECTED NOT DETECTED Final   Candida tropicalis NOT DETECTED NOT DETECTED Final   Cryptococcus neoformans/gattii NOT DETECTED NOT DETECTED Final    Comment: Performed at [facility], [address] [place], [place] [address]  Blood culture (routine x 2)     Status: None (Preliminary result)   Collection Time: 12/05/24  3:13 PM   Specimen:  BLOOD RIGHT ARM  Result Value Ref Range Status   Specimen Description   Final    BLOOD RIGHT ARM Performed at [facility], [address] [place], [place] [address]    Special Requests   Final    BOTTLES DRAWN AEROBIC ONLY Blood Culture results may not be optimal due to an inadequate volume of blood received in culture bottles Performed at [facility], [address], [place], [place] [address]    Culture   Final    NO GROWTH 2 DAYS Performed at [facility], [address], [place], [place] [address]    Report Status PENDING  Incomplete  Culture, blood (Routine X 2) w Reflex to ID Panel     Status: None (Preliminary result)   Collection Time: 12/06/24  6:57 PM   Specimen: BLOOD RIGHT ARM  Result Value Ref Range Status   Specimen Description BLOOD RIGHT ARM  Final   Special Requests AEROBIC BOTTLE ONLY Blood Culture adequate volume  Final   Culture   Final    NO GROWTH < 12 HOURS Performed at [facility], [address] [place], [place] [address]    Report Status PENDING  Incomplete  Culture, blood (Routine X 2) w Reflex to ID Panel     Status: None (Preliminary result)   Collection Time: 12/06/24  6:57 PM   Specimen: BLOOD RIGHT ARM  Result Value Ref Range Status   Specimen Description BLOOD RIGHT ARM  Final   Special Requests AEROBIC BOTTLE ONLY Blood Culture adequate volume  Final   Culture   Final    NO GROWTH < 12 HOURS Performed at [facility], [address], [place], [place] [address]    Report Status PENDING  Incomplete  Radiology Studies: No results found.       Scheduled Meds:  albuterol   2.5 mg Nebulization QID   aspirin  EC  81 mg Oral Daily   atorvastatin   40 mg Oral Daily   carvedilol   12.5 mg Oral BID   fluticasone   2 spray Each Nare Daily   furosemide   80 mg Intravenous BID   insulin  aspart  0-15 Units Subcutaneous TID WC   insulin  aspart  0-5 Units Subcutaneous QHS   loratadine   10 mg  Oral Daily   oseltamivir   30 mg Oral BID   pantoprazole   40 mg Oral Daily   sacubitril -valsartan   1 tablet Oral BID   warfarin  5 mg Oral ONCE-1600   Warfarin - Pharmacist Dosing Inpatient   Does not apply q1600   Continuous Infusions:  cefTRIAXone  (ROCEPHIN )  IV 2 g (12/06/24 2036)     LOS: 2 days    Time spent: 40 minutes    Toribio Hummer, MD [facility]   To contact the attending provider between 7A-7P or the covering provider during after hours 7P-7A, please log into the web site [email or web address] and access using universal [place] password for that web site. If you do not have the password, please call the hospital operator.  12/07/2024, 12:45 PM    "

## 2024-12-07 NOTE — Progress Notes (Signed)
 PHARMACY - ANTICOAGULATION CONSULT NOTE  Pharmacy Consult for warfarin Indication: atrial fibrillation  Allergies[1]  Patient Measurements: Height: 5' 2 (157.5 cm) Weight: 76.2 kg (167 lb 15.9 oz) IBW/kg (Calculated) : 50.1 HEPARIN  DW (KG): 68.2  Vital Signs: Temp: 97.8 F (36.6 C) (01/04 0552) Temp Source: Oral (01/04 0552) BP: 118/67 (01/04 0816) Pulse Rate: 59 (01/04 0816)  Labs: Recent Labs    12/05/24 0344 12/05/24 0359 12/06/24 0426 12/07/24 0434  HGB 13.5  --  11.9* 12.3  HCT 43.7  --  38.7 39.9  PLT 173  --  160 191  LABPROT  --  25.6* 29.5* 27.3*  INR  --  2.2* 2.7* 2.4*  CREATININE 1.30*  --  1.43* 1.47*    Estimated Creatinine Clearance: 34.5 mL/min (A) (by C-G formula based on SCr of 1.47 mg/dL (H)).   Medical History: Past Medical History:  Diagnosis Date   Arrhythmia    Hx PAF, PVC's   CHF (congestive heart failure) (HCC)    Coronary artery disease    04/04/2017 STEMI   Diabetes [facility])    H/O mitral valve replacement 09/21/2021   Heart attack (HCC)    per pt reprot   Hyperlipidemia    Hypertension    New onset atrial fibrillation (HCC) 05/2017    Medications:  Warfarin 5 mg daily PTA Last dose 12/30 Dose consistent with last visit to anticoag clinic 12/11  Assessment: 69 year old patient with history of atrial fibrillation on warfarin PTA presents with shortness of breath, positive for influenza with concern for CAP. Started on ceftriaxone , azithromycin  and Tamiflu .  INR 2.2 on admission - noted patient has missed two days of warfarin. Patient started on azithromycin  here which may increase INR.  12/07/2024 INR 2.4 - therapeutic Day 3 abx Hg 13.5> 11.9> 12.3 PLT WNL SCr 1.3> 1.43> 1.47 No bleeding reported  Goal of Therapy:  INR 2-3 Monitor platelets by anticoagulation protocol: Yes   Plan:  -warfarin 5 mg po x 1 dose today -Daily INR  Rosaline IVAR Edison, Pharm.D Use secure chat for questions 12/07/2024 10:50 AM      [1]   Allergies Allergen Reactions   Keflex  [Cephalexin ] Rash    Skin on feet peeled.   Asa [Aspirin ]     325 mg ----heartburn Pt can take 81 mg   Enalapril Cough   Lisinopril  Cough

## 2024-12-07 NOTE — Progress Notes (Signed)
 "  Rounding Note   Patient Name: Rachael Davis Date of Encounter: 12/07/2024  [facility] Cardiologist: Annabella Scarce, MD   Subjective - No acute events overnight - The patient says that she feels well today and had good urine output. - ID saw the patient and agreed with patient having bacteremia and trending blood cultures  Scheduled Meds:  albuterol   2.5 mg Nebulization QID   aspirin  EC  81 mg Oral Daily   atorvastatin   40 mg Oral Daily   carvedilol   12.5 mg Oral BID   fluticasone   2 spray Each Nare Daily   furosemide   80 mg Intravenous BID   insulin  aspart  0-15 Units Subcutaneous TID WC   insulin  aspart  0-5 Units Subcutaneous QHS   isosorbide  mononitrate  30 mg Oral Daily   loratadine   10 mg Oral Daily   oseltamivir   30 mg Oral BID   pantoprazole   40 mg Oral Daily   sacubitril -valsartan   1 tablet Oral BID   Warfarin - Pharmacist Dosing Inpatient   Does not apply q1600   Continuous Infusions:  cefTRIAXone  (ROCEPHIN )  IV 2 g (12/06/24 2036)   PRN Meds: acetaminophen  **OR** acetaminophen , albuterol , ondansetron  **OR** ondansetron  (ZOFRAN ) IV, traZODone    Vital Signs  Vitals:   12/07/24 0330 12/07/24 0500 12/07/24 0552 12/07/24 0816  BP: 115/67  107/74 118/67  Pulse: 71  79 (!) 59  Resp: 20  20   Temp: 97.8 F (36.6 C)  97.8 F (36.6 C)   TempSrc: Oral  Oral   SpO2: 100%  100% 100%  Weight:  76.2 kg    Height:        Intake/Output Summary (Last 24 hours) at 12/07/2024 0839 Last data filed at 12/07/2024 0835 Gross per 24 hour  Intake 800.11 ml  Output 2700 ml  Net -1899.89 ml      12/07/2024    5:00 AM 12/05/2024    4:37 AM 10/02/2024    2:09 PM  Last 3 Weights  Weight (lbs) 167 lb 15.9 oz 179 lb 173 lb 3.2 oz  Weight (kg) 76.2 kg 81.194 kg 78.563 kg      Telemetry NSR, PVCs- Personally Reviewed  ECG  No new ECG  Physical Exam  GEN: No acute distress.   Neck: No JVD, thyromegaly Cardiac: RRR, no murmurs, rubs, or gallops.   Respiratory: Bibasilar rales, no wheezes or rhonchi GI: Soft, nontender, non-distended  MS: 2+ pitting edema bilaterally, WWP no deformity. Neuro:  Nonfocal  Psych: Normal affect   Labs High Sensitivity Troponin:  No results for input(s): TROPONINIHS in the last 720 hours. No results for input(s): TRNPT in the last 720 hours.     Chemistry Recent Labs  Lab 12/05/24 0344 12/06/24 0426 12/07/24 0434  NA 134* 138 139  K 5.1 4.5 4.1  CL 99 105 103  CO2 19* 22 24  GLUCOSE 342* 127* 113*  BUN 21 35* 36*  CREATININE 1.30* 1.43* 1.47*  CALCIUM  9.3 8.6* 8.7*  MG  --   --  2.5*  GFRNONAA 44* 40* 38*  ANIONGAP 15 11 13     Lipids No results for input(s): CHOL, TRIG, HDL, LABVLDL, LDLCALC, CHOLHDL in the last 168 hours.  Hematology Recent Labs  Lab 12/05/24 0344 12/06/24 0426 12/07/24 0434  WBC 18.4* 15.9* 10.3  RBC 5.87* 5.25* 5.44*  HGB 13.5 11.9* 12.3  HCT 43.7 38.7 39.9  MCV 74.4* 73.7* 73.3*  MCH 23.0* 22.7* 22.6*  MCHC 30.9 30.7 30.8  RDW 20.2* 19.4* 19.8*  PLT 173 160 191   Thyroid  No results for input(s): TSH, FREET4 in the last 168 hours.  BNP Recent Labs  Lab 12/05/24 0344 12/06/24 0426  PROBNP 29,984.0* 70,314.9*    DDimer No results for input(s): DDIMER in the last 168 hours.   Radiology  No results found.   Cardiac Studies  TTE 09/17/24:  IMPRESSIONS     1. Left ventricular ejection fraction, by estimation, is <20%. The left  ventricle has severely decreased function. The left ventricle demonstrates  global hypokinesis. Left ventricular diastolic parameters are  indeterminate.   2. Right ventricular systolic function is normal. The right ventricular  size is normal.   3. Left atrial size was mildly dilated.   4. The mitral valve has been repaired/replaced. No evidence of mitral  valve regurgitation. No evidence of mitral stenosis. There is a 25 mm  Magna Ease bioprosthetic present in the mitral position. Procedure Date:   09/21/21. Echo findings are consistent  with normal structure and function of the mitral valve prosthesis.   5. The aortic valve is tricuspid. Aortic valve regurgitation is not  visualized. No aortic stenosis is present.   6. The inferior vena cava is normal in size with greater than 50%  respiratory variability, suggesting right atrial pressure of 3 mmHg.    Patient Profile   Rachael Davis is a 69 y.o. 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 Zella.   Assessment & Plan   #Acute on Chronic HFrEF Exacerbation - Admitted on 1/2 for shortness of breath and found to have a markedly elevated BNP and volume overload in the setting of influenza infection and medication noncompliance - Still appears to be volume up despite IV diuresis without robust urine output. - Will continue diuresis given that she still is volume overloaded Continue Lasix  to 80 mg IV twice daily Continue carvedilol  12.5 mg twice daily Continue Entresto  low-dose Hold Imdur  for softer blood pressures in the setting of bacteremia Hold Farxiga  in the setting of strep pneumonia bacteremia Complete echocardiogram pending  #CAD s/p 4V CABG - No chest pain symptoms. -Continue home medications Continue aspirin  81 mg daily Continue Lipitor  40 mg daily Holding Imdur  for blood pressures Holding Farxiga  for bacteremia  #PAF #bAVR Continue warfarin Daily INRs Maintain telemetry Continue carvedilol  12.5 mg twice daily  #VT s/p [place] Sci ICD - No VT on telemetry - The patient is bacteremic with strep pneumonia which is not a common organism to see ICD's. Will get an echocardiogram to evaluate for endocarditis. Complete echocardiogram to rule out vegetations  #Influenza  A PNA # Strep pneumoniae bacteremia - Management per primary and ID      For questions or updates, please contact [facility] Please consult [email or web address] for contact info under       Signed, Georganna Archer, MD  12/07/2024, 8:39 AM    "

## 2024-12-07 NOTE — Progress Notes (Signed)
" °  Echocardiogram 2D Echocardiogram has been performed.  Tinnie FORBES Gosling RDCS 12/07/2024, 10:34 AM "

## 2024-12-07 NOTE — H&P (View-Only) (Signed)
 "  Rounding Note   Patient Name: Rachael Davis Date of Encounter: 12/07/2024  [facility] Cardiologist: Annabella Scarce, MD   Subjective - No acute events overnight - The patient says that she feels well today and had good urine output. - ID saw the patient and agreed with patient having bacteremia and trending blood cultures  Scheduled Meds:  albuterol   2.5 mg Nebulization QID   aspirin  EC  81 mg Oral Daily   atorvastatin   40 mg Oral Daily   carvedilol   12.5 mg Oral BID   fluticasone   2 spray Each Nare Daily   furosemide   80 mg Intravenous BID   insulin  aspart  0-15 Units Subcutaneous TID WC   insulin  aspart  0-5 Units Subcutaneous QHS   isosorbide  mononitrate  30 mg Oral Daily   loratadine   10 mg Oral Daily   oseltamivir   30 mg Oral BID   pantoprazole   40 mg Oral Daily   sacubitril -valsartan   1 tablet Oral BID   Warfarin - Pharmacist Dosing Inpatient   Does not apply q1600   Continuous Infusions:  cefTRIAXone  (ROCEPHIN )  IV 2 g (12/06/24 2036)   PRN Meds: acetaminophen  **OR** acetaminophen , albuterol , ondansetron  **OR** ondansetron  (ZOFRAN ) IV, traZODone    Vital Signs  Vitals:   12/07/24 0330 12/07/24 0500 12/07/24 0552 12/07/24 0816  BP: 115/67  107/74 118/67  Pulse: 71  79 (!) 59  Resp: 20  20   Temp: 97.8 F (36.6 C)  97.8 F (36.6 C)   TempSrc: Oral  Oral   SpO2: 100%  100% 100%  Weight:  76.2 kg    Height:        Intake/Output Summary (Last 24 hours) at 12/07/2024 0839 Last data filed at 12/07/2024 0835 Gross per 24 hour  Intake 800.11 ml  Output 2700 ml  Net -1899.89 ml      12/07/2024    5:00 AM 12/05/2024    4:37 AM 10/02/2024    2:09 PM  Last 3 Weights  Weight (lbs) 167 lb 15.9 oz 179 lb 173 lb 3.2 oz  Weight (kg) 76.2 kg 81.194 kg 78.563 kg      Telemetry NSR, PVCs- Personally Reviewed  ECG  No new ECG  Physical Exam  GEN: No acute distress.   Neck: No JVD, thyromegaly Cardiac: RRR, no murmurs, rubs, or gallops.   Respiratory: Bibasilar rales, no wheezes or rhonchi GI: Soft, nontender, non-distended  MS: 2+ pitting edema bilaterally, WWP no deformity. Neuro:  Nonfocal  Psych: Normal affect   Labs High Sensitivity Troponin:  No results for input(s): TROPONINIHS in the last 720 hours. No results for input(s): TRNPT in the last 720 hours.     Chemistry Recent Labs  Lab 12/05/24 0344 12/06/24 0426 12/07/24 0434  NA 134* 138 139  K 5.1 4.5 4.1  CL 99 105 103  CO2 19* 22 24  GLUCOSE 342* 127* 113*  BUN 21 35* 36*  CREATININE 1.30* 1.43* 1.47*  CALCIUM  9.3 8.6* 8.7*  MG  --   --  2.5*  GFRNONAA 44* 40* 38*  ANIONGAP 15 11 13     Lipids No results for input(s): CHOL, TRIG, HDL, LABVLDL, LDLCALC, CHOLHDL in the last 168 hours.  Hematology Recent Labs  Lab 12/05/24 0344 12/06/24 0426 12/07/24 0434  WBC 18.4* 15.9* 10.3  RBC 5.87* 5.25* 5.44*  HGB 13.5 11.9* 12.3  HCT 43.7 38.7 39.9  MCV 74.4* 73.7* 73.3*  MCH 23.0* 22.7* 22.6*  MCHC 30.9 30.7 30.8  RDW 20.2* 19.4* 19.8*  PLT 173 160 191   Thyroid  No results for input(s): TSH, FREET4 in the last 168 hours.  BNP Recent Labs  Lab 12/05/24 0344 12/06/24 0426  PROBNP 29,984.0* 70,314.9*    DDimer No results for input(s): DDIMER in the last 168 hours.   Radiology  No results found.   Cardiac Studies  TTE 09/17/24:  IMPRESSIONS     1. Left ventricular ejection fraction, by estimation, is <20%. The left  ventricle has severely decreased function. The left ventricle demonstrates  global hypokinesis. Left ventricular diastolic parameters are  indeterminate.   2. Right ventricular systolic function is normal. The right ventricular  size is normal.   3. Left atrial size was mildly dilated.   4. The mitral valve has been repaired/replaced. No evidence of mitral  valve regurgitation. No evidence of mitral stenosis. There is a 25 mm  Magna Ease bioprosthetic present in the mitral position. Procedure Date:   09/21/21. Echo findings are consistent  with normal structure and function of the mitral valve prosthesis.   5. The aortic valve is tricuspid. Aortic valve regurgitation is not  visualized. No aortic stenosis is present.   6. The inferior vena cava is normal in size with greater than 50%  respiratory variability, suggesting right atrial pressure of 3 mmHg.    Patient Profile   Rachael Davis is a 69 y.o. 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 Zella.   Assessment & Plan   #Acute on Chronic HFrEF Exacerbation - Admitted on 1/2 for shortness of breath and found to have a markedly elevated BNP and volume overload in the setting of influenza infection and medication noncompliance - Still appears to be volume up despite IV diuresis without robust urine output. - Will continue diuresis given that she still is volume overloaded Continue Lasix  to 80 mg IV twice daily Continue carvedilol  12.5 mg twice daily Continue Entresto  low-dose Hold Imdur  for softer blood pressures in the setting of bacteremia Hold Farxiga  in the setting of strep pneumonia bacteremia Complete echocardiogram pending  #CAD s/p 4V CABG - No chest pain symptoms. -Continue home medications Continue aspirin  81 mg daily Continue Lipitor  40 mg daily Holding Imdur  for blood pressures Holding Farxiga  for bacteremia  #PAF #bAVR Continue warfarin Daily INRs Maintain telemetry Continue carvedilol  12.5 mg twice daily  #VT s/p [place] Sci ICD - No VT on telemetry - The patient is bacteremic with strep pneumonia which is not a common organism to see ICD's. Will get an echocardiogram to evaluate for endocarditis. Complete echocardiogram to rule out vegetations  #Influenza  A PNA # Strep pneumoniae bacteremia - Management per primary and ID      For questions or updates, please contact [facility] Please consult [email or web address] for contact info under       Signed, Georganna Archer, MD  12/07/2024, 8:39 AM    "

## 2024-12-08 ENCOUNTER — Inpatient Hospital Stay (HOSPITAL_COMMUNITY): Admitting: Certified Registered Nurse Anesthetist

## 2024-12-08 ENCOUNTER — Inpatient Hospital Stay (HOSPITAL_COMMUNITY)

## 2024-12-08 ENCOUNTER — Encounter (HOSPITAL_COMMUNITY)
Admission: EM | Disposition: A | Discharge status: Home Health | Payer: Self-pay | Source: Home / Self Care | Attending: Internal Medicine

## 2024-12-08 DIAGNOSIS — I5021 Acute systolic (congestive) heart failure: Secondary | ICD-10-CM | POA: Diagnosis not present

## 2024-12-08 DIAGNOSIS — J189 Pneumonia, unspecified organism: Secondary | ICD-10-CM | POA: Diagnosis not present

## 2024-12-08 DIAGNOSIS — I2510 Atherosclerotic heart disease of native coronary artery without angina pectoris: Secondary | ICD-10-CM

## 2024-12-08 DIAGNOSIS — Z953 Presence of xenogenic heart valve: Secondary | ICD-10-CM | POA: Diagnosis not present

## 2024-12-08 DIAGNOSIS — N1831 Chronic kidney disease, stage 3a: Secondary | ICD-10-CM | POA: Diagnosis not present

## 2024-12-08 DIAGNOSIS — I480 Paroxysmal atrial fibrillation: Secondary | ICD-10-CM | POA: Diagnosis not present

## 2024-12-08 DIAGNOSIS — R7881 Bacteremia: Secondary | ICD-10-CM | POA: Diagnosis not present

## 2024-12-08 DIAGNOSIS — I330 Acute and subacute infective endocarditis: Secondary | ICD-10-CM

## 2024-12-08 DIAGNOSIS — R6520 Severe sepsis without septic shock: Secondary | ICD-10-CM | POA: Diagnosis not present

## 2024-12-08 DIAGNOSIS — I4891 Unspecified atrial fibrillation: Secondary | ICD-10-CM

## 2024-12-08 DIAGNOSIS — I110 Hypertensive heart disease with heart failure: Secondary | ICD-10-CM

## 2024-12-08 DIAGNOSIS — Z95810 Presence of automatic (implantable) cardiac defibrillator: Secondary | ICD-10-CM | POA: Diagnosis not present

## 2024-12-08 DIAGNOSIS — J09X1 Influenza due to identified novel influenza A virus with pneumonia: Secondary | ICD-10-CM | POA: Diagnosis not present

## 2024-12-08 DIAGNOSIS — I5022 Chronic systolic (congestive) heart failure: Secondary | ICD-10-CM | POA: Diagnosis not present

## 2024-12-08 DIAGNOSIS — B955 Unspecified streptococcus as the cause of diseases classified elsewhere: Secondary | ICD-10-CM | POA: Diagnosis not present

## 2024-12-08 DIAGNOSIS — E1122 Type 2 diabetes mellitus with diabetic chronic kidney disease: Secondary | ICD-10-CM | POA: Diagnosis not present

## 2024-12-08 DIAGNOSIS — I509 Heart failure, unspecified: Secondary | ICD-10-CM

## 2024-12-08 DIAGNOSIS — N189 Chronic kidney disease, unspecified: Secondary | ICD-10-CM | POA: Diagnosis not present

## 2024-12-08 DIAGNOSIS — I38 Endocarditis, valve unspecified: Secondary | ICD-10-CM

## 2024-12-08 DIAGNOSIS — A419 Sepsis, unspecified organism: Secondary | ICD-10-CM | POA: Diagnosis not present

## 2024-12-08 DIAGNOSIS — B953 Streptococcus pneumoniae as the cause of diseases classified elsewhere: Secondary | ICD-10-CM | POA: Diagnosis not present

## 2024-12-08 HISTORY — PX: TRANSESOPHAGEAL ECHOCARDIOGRAM (CATH LAB): EP1270

## 2024-12-08 LAB — CBC WITH DIFFERENTIAL/PLATELET
Abs Immature Granulocytes: 0.03 K/uL (ref 0.00–0.07)
Basophils Absolute: 0 K/uL (ref 0.0–0.1)
Basophils Relative: 0 %
Eosinophils Absolute: 0.1 K/uL (ref 0.0–0.5)
Eosinophils Relative: 1 %
HCT: 40.3 % (ref 36.0–46.0)
Hemoglobin: 12.4 g/dL (ref 12.0–15.0)
Immature Granulocytes: 0 %
Lymphocytes Relative: 16 %
Lymphs Abs: 1.1 K/uL (ref 0.7–4.0)
MCH: 22.5 pg — ABNORMAL LOW (ref 26.0–34.0)
MCHC: 30.8 g/dL (ref 30.0–36.0)
MCV: 73.1 fL — ABNORMAL LOW (ref 80.0–100.0)
Monocytes Absolute: 0.5 K/uL (ref 0.1–1.0)
Monocytes Relative: 8 %
Neutro Abs: 5.3 K/uL (ref 1.7–7.7)
Neutrophils Relative %: 75 %
Platelets: 193 K/uL (ref 150–400)
RBC: 5.51 MIL/uL — ABNORMAL HIGH (ref 3.87–5.11)
RDW: 19.5 % — ABNORMAL HIGH (ref 11.5–15.5)
WBC: 7.1 K/uL (ref 4.0–10.5)
nRBC: 0 % (ref 0.0–0.2)

## 2024-12-08 LAB — BASIC METABOLIC PANEL WITH GFR
Anion gap: 11 (ref 5–15)
BUN: 35 mg/dL — ABNORMAL HIGH (ref 8–23)
CO2: 25 mmol/L (ref 22–32)
Calcium: 8.5 mg/dL — ABNORMAL LOW (ref 8.9–10.3)
Chloride: 103 mmol/L (ref 98–111)
Creatinine, Ser: 1.42 mg/dL — ABNORMAL HIGH (ref 0.44–1.00)
GFR, Estimated: 40 mL/min — ABNORMAL LOW
Glucose, Bld: 143 mg/dL — ABNORMAL HIGH (ref 70–99)
Potassium: 3.6 mmol/L (ref 3.5–5.1)
Sodium: 140 mmol/L (ref 135–145)

## 2024-12-08 LAB — GLUCOSE, CAPILLARY
Glucose-Capillary: 146 mg/dL — ABNORMAL HIGH (ref 70–99)
Glucose-Capillary: 234 mg/dL — ABNORMAL HIGH (ref 70–99)
Glucose-Capillary: 128 mg/dL — ABNORMAL HIGH (ref 70–99)

## 2024-12-08 LAB — PROTIME-INR
INR: 2.8 — ABNORMAL HIGH (ref 0.8–1.2)
Prothrombin Time: 30.6 s — ABNORMAL HIGH (ref 11.4–15.2)

## 2024-12-08 LAB — MAGNESIUM: Magnesium: 2.4 mg/dL (ref 1.7–2.4)

## 2024-12-08 LAB — ECHO TEE

## 2024-12-08 MED ORDER — LIDOCAINE 2% (20 MG/ML) 5 ML SYRINGE
INTRAMUSCULAR | Status: DC | PRN
  Administered 2024-12-08: 100 mg via INTRAVENOUS

## 2024-12-08 MED ORDER — ETOMIDATE 2 MG/ML IV SOLN
INTRAVENOUS | Status: DC | PRN
  Administered 2024-12-08: 10 mg via INTRAVENOUS
  Administered 2024-12-08 (×2): 5 mg via INTRAVENOUS

## 2024-12-08 MED ORDER — WARFARIN SODIUM 5 MG PO TABS
5.0000 mg | ORAL_TABLET | Freq: Every day | ORAL | Status: DC
  Administered 2024-12-08: 5 mg via ORAL
  Filled 2024-12-08: qty 1

## 2024-12-08 MED ORDER — POTASSIUM CHLORIDE CRYS ER 20 MEQ PO TBCR
40.0000 meq | EXTENDED_RELEASE_TABLET | Freq: Every day | ORAL | Status: DC
  Administered 2024-12-08 – 2024-12-11 (×4): 40 meq via ORAL
  Filled 2024-12-08 (×4): qty 2

## 2024-12-08 MED ORDER — PHENYLEPHRINE 80 MCG/ML (10ML) SYRINGE FOR IV PUSH (FOR BLOOD PRESSURE SUPPORT)
PREFILLED_SYRINGE | INTRAVENOUS | Status: DC | PRN
  Administered 2024-12-08 (×2): 160 ug via INTRAVENOUS

## 2024-12-08 MED ORDER — ORAL CARE MOUTH RINSE: 15.0000 mL | OROMUCOSAL | Status: DC | PRN

## 2024-12-08 MED ORDER — PROPOFOL 10 MG/ML IV BOLUS
INTRAVENOUS | Status: DC | PRN
  Administered 2024-12-08: 25 mg via INTRAVENOUS
  Administered 2024-12-08: 15 mg via INTRAVENOUS
  Administered 2024-12-08: 10 mg via INTRAVENOUS

## 2024-12-08 NOTE — Plan of Care (Signed)
  Problem: Fluid Volume: Goal: Ability to maintain a balanced intake and output will improve Outcome: Progressing   Problem: Metabolic: Goal: Ability to maintain appropriate glucose levels will improve Outcome: Progressing   Problem: Nutritional: Goal: Maintenance of adequate nutrition will improve Outcome: Progressing   Problem: Skin Integrity: Goal: Risk for impaired skin integrity will decrease Outcome: Progressing   Problem: Tissue Perfusion: Goal: Adequacy of tissue perfusion will improve Outcome: Progressing   Problem: Education: Goal: Knowledge of General Education information will improve Description: Including pain rating scale, medication(s)/side effects and non-pharmacologic comfort measures Outcome: Progressing   Problem: Clinical Measurements: Goal: Ability to maintain clinical measurements within normal limits will improve Outcome: Progressing Goal: Diagnostic test results will improve Outcome: Progressing Goal: Respiratory complications will improve Outcome: Progressing Goal: Cardiovascular complication will be avoided Outcome: Progressing   Problem: Activity: Goal: Risk for activity intolerance will decrease Outcome: Progressing   Problem: Nutrition: Goal: Adequate nutrition will be maintained Outcome: Progressing   Problem: Coping: Goal: Level of anxiety will decrease Outcome: Progressing   Problem: Elimination: Goal: Will not experience complications related to bowel motility Outcome: Progressing Goal: Will not experience complications related to urinary retention Outcome: Progressing   Problem: Pain Managment: Goal: General experience of comfort will improve and/or be controlled Outcome: Progressing   Problem: Safety: Goal: Ability to remain free from injury will improve Outcome: Progressing   Problem: Skin Integrity: Goal: Risk for impaired skin integrity will decrease Outcome: Progressing

## 2024-12-08 NOTE — Anesthesia Preprocedure Evaluation (Addendum)
"                                    Anesthesia Evaluation  Patient identified by MRN, date of birth, ID band Patient awake    Reviewed: Allergy & Precautions, NPO status , Patient's Chart, lab work & pertinent test results  Airway Mallampati: II  TM Distance: >3 FB     Dental  (+) Edentulous Upper, Missing   Pulmonary neg pulmonary ROS   Pulmonary exam normal        Cardiovascular hypertension, Pt. on home beta blockers and Pt. on medications + CAD, + Past MI, + Cardiac Stents, + CABG and +CHF  Normal cardiovascular exam+ dysrhythmias Atrial Fibrillation + Valvular Problems/Murmurs   ECHO:  1. Left ventricular ejection fraction, by estimation, is <20%. Left  ventricular ejection fraction by 2D MOD biplane is 10.6 %. The left  ventricle has severely decreased function. The left ventricle demonstrates  global hypokinesis. The left ventricular  internal cavity size was severely dilated. Diastology is indeterminate in  the setting of MV replacement.   2. Right ventricular systolic function is severely reduced. The right  ventricular size is moderately enlarged. There is normal pulmonary artery  systolic pressure.   3. Left atrial size was mildly dilated.   4. The mitral valve has been replaced. There is a 25 mm Magna Ease  bioprosthetic valve present in the mitral position. The bioprosthesis  appears to be well seated. On the left ventricular side of the mitral  valve there is an independently mobile  echodensity measuring 0.31 cm x 0.97 cm concerning for possible infective  endocarditis. This was not visible on the echocardiogram dated 09/17/24.  There is trivial mitral regurgitation. The gradients through the mitral  valve are within normal limits.   5. The aortic valve is tricuspid. Aortic valve regurgitation is not  visualized. No aortic stenosis is present.   6. The inferior vena cava is dilated in size with >50% respiratory  variability, suggesting right  atrial pressure of 8 mmHg.     Neuro/Psych negative neurological ROS  negative psych ROS   GI/Hepatic negative GI ROS, Neg liver ROS,,,  Endo/Other  diabetes, Oral Hypoglycemic Agents    Renal/GU Renal disease     Musculoskeletal negative musculoskeletal ROS (+)    Abdominal  (+) + obese  Peds  Hematology  (+) Blood dyscrasia (Coumadin )   Anesthesia Other Findings endocarditis  Reproductive/Obstetrics                              Anesthesia Physical Anesthesia Plan  ASA: 4  Anesthesia Plan: MAC   Post-op Pain Management:    Induction:   PONV Risk Score and Plan: 2 and Propofol  infusion and Treatment may vary due to age or medical condition  Airway Management Planned: Nasal Cannula  Additional Equipment:   Intra-op Plan:   Post-operative Plan:   Informed Consent: I have reviewed the patients History and Physical, chart, labs and discussed the procedure including the risks, benefits and alternatives for the proposed anesthesia with the patient or authorized representative who has indicated his/her understanding and acceptance.     Dental advisory given  Plan Discussed with: CRNA  Anesthesia Plan Comments:          Anesthesia Quick Evaluation  "

## 2024-12-08 NOTE — Progress Notes (Signed)
 " PROGRESS NOTE    Rachael Davis  FMW:[id number] DOB: 06/29/1956 DOA: 12/05/2024 PCP: Sun, Vyvyan, MD    Chief Complaint  Patient presents with   Cough   Shortness of Breath    Brief Narrative:  Rachael Davis is a 69 y.o. female with medical history significant for atrial fibrillation on Coumadin , chronic systolic congestive heart failure with EF 20%, diabetes, hypertension being admitted to the hospital with evidence of acute heart failure in the setting of influenza A infection, pneumonia, Streptococcus bacteremia.  Cardiology, ID consulted.   Assessment & Plan:   Principal Problem:   Acute CHF (congestive heart failure) (HCC) Active Problems:   Endocarditis   Bacteremia due to Streptococcus pneumoniae   Paroxysmal atrial fibrillation (HCC)   Acute systolic (congestive) heart failure (HCC)   Community acquired pneumonia   Influenza A with pneumonia   Severe sepsis (HCC)   S/P mitral valve replacement with bioprosthetic valve   Type 2 diabetes mellitus with stage 3a chronic kidney disease, without long-term current use of insulin  (HCC)  #1 acute on chronic systolic heart failure -Patient presented with lower extremity edema, orthopnea felt likely secondary to influenza A, medication noncompliance, elevated proBNP of 29,984. - Patient with known EF <20% with left ventricular global hypokinesis (2D echo 09/17/2024). - Patient was initially on Lasix  60 mg IV every 12 hours and dose increased to Lasix  80 mg IV every 12 hours.  - Patient with urine output of 2 L over the past 24 hours. - Patient still volume overloaded on examination. - Patient followed by cardiology. -2D echo done with a EF of <20%, left ventricular global hypokinesis, severely reduced right ventricular systolic function, normal pulmonary artery systolic pressure, mildly dilated left atrial size, mitral valve replacement bioprosthetic valve.  Left ventricular side of mitral valve with an independently mobile  echodensity measuring 0.31 cm x 0.97 cm concerning for possible infective endocarditis.  -TEE ordered per cardiology to be done today. - Continue carvedilol  12.5 mg twice daily, low-dose Entresto . - Imdur  discontinued by cardiology due to soft blood pressure and to allow diuresis. - Cardiology recommend to hold Farxiga  in the setting of bacteremia. - Cardiology following appreciate the input and recommendations.  2.  Severe sepsis secondary to influenza A, probable pneumonia, Streptococcus bacteremia/endocarditis. - Patient met criteria for sepsis on admission with leukocytosis, tachycardia, endorgan dysfunction with a lactic of 2.5. - Source of sepsis felt to be multifactorial secondary to influenza A, suspected community-acquired pneumonia, Streptococcus bacteremia. - Patient not received fluid boluses due acute CHF exacerbation with a baseline EF <20%. -2D echo done with a EF of <20%, left ventricular global hypokinesis, severely reduced right ventricular systolic function, normal pulmonary artery systolic pressure, mildly dilated left atrial size, mitral valve replacement bioprosthetic valve.  Left ventricular side of mitral valve with an independently mobile echodensity measuring 0.31 cm x 0.97 cm concerning for possible infective endocarditis.  -TEE ordered per cardiology to be done today. - Continue IV Rocephin , Tamiflu .  - ID consulted and following.   3.  Influenza A -Patient noted to have presented with cough, dyspnea, orthopnea. - Patient with no hypoxia. - Continue Tamiflu .  - Continue supportive care.  4.  Community-acquired pneumonia  -Patient with leukocytosis, productive cough, chest x-ray concerning for pneumonia. - Blood cultures with Streptococcus bacteremia. - Azithromycin  discontinued. - Continue IV Rocephin .   5.  Streptococcus pneumoniae bacteremia/probable endocarditis - May have seeded from probable pneumonia. - Patient status post ICD concern for possible  vegetations. -  2D echo with a EF of <20%, left ventricular global hypokinesis, severely reduced right ventricular systolic function, normal pulmonary artery systolic pressure, mildly dilated left atrial size, mitral valve replacement bioprosthetic valve.  Left ventricular side of mitral valve with an independently mobile echodensity measuring 0.31 cm x 0.97 cm concerning for possible infective endocarditis.  . - ID consulted and repeat blood cultures ordered and pending. - Continue IV Rocephin . -Cardiology recommending TEE to be done this afternoon. - ID following.  6.  CKD stage IIIa -Renal function at baseline. - Monitor with diuresis and Entresto .  7.  Hypertension - Continue carvedilol , Entresto , IV Lasix .  - Imdur  discontinued per cardiology due to soft blood pressure and to allow room for diuresis.    8.  Diabetes mellitus type 2 -Hemoglobin A1c 6.6 (09/16/2024) - CBG 146 this morning. - Farxiga  discontinued due to acute infection with bacteremia/concern for endocarditis. - SSI.  9.  Paroxysmal A-fib/bioprosthetic mitral valve replacement - Continue carvedilol  for rate control.   - Coumadin  for anticoagulation.   10.  History of VT status post [employer] ICD -No VT noted on telemetry. - Patient being followed by cardiology, concern for seeding of device with bacteremia. - 2D echo with a EF of <20%, left ventricular global hypokinesis, severely reduced right ventricular systolic function, normal pulmonary artery systolic pressure, mildly dilated left atrial size, mitral valve replacement bioprosthetic valve.  Left ventricular side of mitral valve with an independently mobile echodensity measuring 0.31 cm x 0.97 cm concerning for possible infective endocarditis.   - Patient being followed by cardiology who are recommending TEE to be done today.  - Per cardiology.   DVT prophylaxis: Coumadin  Code Status: Full Family Communication: Updated patient.  No family at  bedside. Disposition: Home when clinically improved and cleared by ID and cardiology.  Status is: Inpatient Remains inpatient appropriate because: Severity of disease.   Consultants:  Cardiology: Dr. Jeffrie 12/05/2024 ID: Dr. Luiz 12/06/2024  Procedures:  Chest x-ray 12/05/2024 2D echo 12/07/2024  Antimicrobials:  Anti-infectives (From admission, onward)    Start     Dose/Rate Route Frequency Ordered Stop   12/06/24 0800  azithromycin  (ZITHROMAX ) 500 mg in sodium chloride  0.9 % 250 mL IVPB  Status:  Discontinued        500 mg 250 mL/hr over 60 Minutes Intravenous Every 24 hours 12/05/24 0754 12/06/24 0604   12/06/24 0800  cefTRIAXone  (ROCEPHIN ) 1 g in sodium chloride  0.9 % 100 mL IVPB  Status:  Discontinued        1 g 200 mL/hr over 30 Minutes Intravenous Every 24 hours 12/05/24 0754 12/05/24 2323   12/05/24 2324  cefTRIAXone  (ROCEPHIN ) 2 g in sodium chloride  0.9 % 100 mL IVPB        2 g 200 mL/hr over 30 Minutes Intravenous Daily at bedtime 12/05/24 2323     12/05/24 1800  oseltamivir  (TAMIFLU ) capsule 30 mg        30 mg Oral 2 times daily 12/05/24 0747 12/10/24 0759   12/05/24 0600  oseltamivir  (TAMIFLU ) capsule 75 mg  Status:  Discontinued        75 mg Oral  Once 12/05/24 0553 12/05/24 0554   12/05/24 0600  oseltamivir  (TAMIFLU ) capsule 30 mg        30 mg Oral  Once 12/05/24 0554 12/05/24 0624   12/05/24 0430  cefTRIAXone  (ROCEPHIN ) 1 g in sodium chloride  0.9 % 100 mL IVPB        1 g 200 mL/hr over  30 Minutes Intravenous  Once 12/05/24 0424 12/05/24 0503   12/05/24 0430  azithromycin  (ZITHROMAX ) 500 mg in sodium chloride  0.9 % 250 mL IVPB        500 mg 250 mL/hr over 60 Minutes Intravenous  Once 12/05/24 0424 12/05/24 0606         Subjective: Patient sitting up in the recliner.  States some improvement with shortness of breath.  Denies any chest pain.  Still with cough.  No abdominal pain.  Patient states has been having urine output but not as much as she has had before  while on IV diuretics.   Objective: Vitals:   12/07/24 2257 12/08/24 0438 12/08/24 0500 12/08/24 0817  BP: 121/77 105/63    Pulse: 73 68    Resp:      Temp:  98.2 F (36.8 C)    TempSrc:  Oral    SpO2: 100% 99%  100%  Weight:   83.7 kg   Height:        Intake/Output Summary (Last 24 hours) at 12/08/2024 1202 Last data filed at 12/08/2024 0600 Gross per 24 hour  Intake 325 ml  Output 1500 ml  Net -1175 ml   Filed Weights   12/05/24 0437 12/07/24 0500 12/08/24 0500  Weight: 81.2 kg 76.2 kg 83.7 kg    Examination:  General exam: NAD. Respiratory system: Bibasilar crackles.  No wheezing, no rhonchi.  Fair air movement.  No use of accessory muscles of respiration.  Cardiovascular system: Regular rate rhythm no murmurs rubs or gallops.  2+ bilateral lower extremity edema. Gastrointestinal system: Abdomen is soft, nontender, nondistended, positive bowel sounds.  No rebound.  No guarding.  Central nervous system: Alert and oriented. No focal neurological deficits. Extremities: 2+ bilateral lower extremity edema.  Symmetric 5 x 5 power. Skin: No rashes, lesions or ulcers Psychiatry: Judgement and insight appear normal. Mood & affect appropriate.     Data Reviewed: I have personally reviewed following labs and imaging studies  CBC: Recent Labs  Lab 12/05/24 0344 12/06/24 0426 12/07/24 0434 12/08/24 0404  WBC 18.4* 15.9* 10.3 7.1  NEUTROABS  --   --  8.3* 5.3  HGB 13.5 11.9* 12.3 12.4  HCT 43.7 38.7 39.9 40.3  MCV 74.4* 73.7* 73.3* 73.1*  PLT 173 160 191 193    Basic Metabolic Panel: Recent Labs  Lab 12/05/24 0344 12/06/24 0426 12/07/24 0434 12/08/24 0404  NA 134* 138 139 140  K 5.1 4.5 4.1 3.6  CL 99 105 103 103  CO2 19* 22 24 25   GLUCOSE 342* 127* 113* 143*  BUN 21 35* 36* 35*  CREATININE 1.30* 1.43* 1.47* 1.42*  CALCIUM  9.3 8.6* 8.7* 8.5*  MG  --   --  2.5* 2.4    GFR: Estimated Creatinine Clearance: 37.5 mL/min (A) (by C-G formula based on SCr of 1.42  mg/dL (H)).  Liver Function Tests: No results for input(s): AST, ALT, ALKPHOS, BILITOT, PROT, ALBUMIN  in the last 168 hours.  CBG: Recent Labs  Lab 12/07/24 0735 12/07/24 1116 12/07/24 1631 12/07/24 1952 12/08/24 0751  GLUCAP 118* 232* 233* 193* 146*     Recent Results (from the past 240 hours)  Resp panel by RT-PCR (RSV, Flu A&B, Covid) Anterior Nasal Swab     Status: Abnormal   Collection Time: 12/05/24  3:44 AM   Specimen: Anterior Nasal Swab  Result Value Ref Range Status   SARS Coronavirus 2 by RT PCR NEGATIVE NEGATIVE Final    Comment: (NOTE) SARS-CoV-2 target  nucleic acids are NOT DETECTED.  The SARS-CoV-2 RNA is generally detectable in upper respiratory specimens during the acute phase of infection. The lowest concentration of SARS-CoV-2 viral copies this assay can detect is 138 copies/mL. A negative result does not preclude SARS-Cov-2 infection and should not be used as the sole basis for treatment or other patient management decisions. A negative result may occur with  improper specimen collection/handling, submission of specimen other than nasopharyngeal swab, presence of viral mutation(s) within the areas targeted by this assay, and inadequate number of viral copies(<138 copies/mL). A negative result must be combined with clinical observations, patient history, and epidemiological information. The expected result is Negative.  Fact Sheet for Patients:  bloggercourse.com  Fact Sheet for Healthcare Providers:  seriousbroker.it  This test is no t yet approved or cleared by the [place]  FDA and  has been authorized for detection and/or diagnosis of SARS-CoV-2 by FDA under an Emergency Use Authorization (EUA). This EUA will remain  in effect (meaning this test can be used) for the duration of the COVID-19 declaration under Section 564(b)(1) of the Act, 21 U.S.C.section 360bbb-3(b)(1), unless the  authorization is terminated  or revoked sooner.       Influenza A by PCR POSITIVE (A) NEGATIVE Final   Influenza B by PCR NEGATIVE NEGATIVE Final    Comment: (NOTE) The Xpert Xpress SARS-CoV-2/FLU/RSV plus assay is intended as an aid in the diagnosis of influenza from Nasopharyngeal swab specimens and should not be used as a sole basis for treatment. Nasal washings and aspirates are unacceptable for Xpert Xpress SARS-CoV-2/FLU/RSV testing.  Fact Sheet for Patients: bloggercourse.com  Fact Sheet for Healthcare Providers: seriousbroker.it  This test is not yet approved or cleared by the [place]  FDA and has been authorized for detection and/or diagnosis of SARS-CoV-2 by FDA under an Emergency Use Authorization (EUA). This EUA will remain in effect (meaning this test can be used) for the duration of the COVID-19 declaration under Section 564(b)(1) of the Act, 21 U.S.C. section 360bbb-3(b)(1), unless the authorization is terminated or revoked.     Resp Syncytial Virus by PCR NEGATIVE NEGATIVE Final    Comment: (NOTE) Fact Sheet for Patients: bloggercourse.com  Fact Sheet for Healthcare Providers: seriousbroker.it  This test is not yet approved or cleared by the [place]  FDA and has been authorized for detection and/or diagnosis of SARS-CoV-2 by FDA under an Emergency Use Authorization (EUA). This EUA will remain in effect (meaning this test can be used) for the duration of the COVID-19 declaration under Section 564(b)(1) of the Act, 21 U.S.C. section 360bbb-3(b)(1), unless the authorization is terminated or revoked.  Performed at [facility], [address] [place], [place] [address]   Blood culture (routine x 2)     Status: Abnormal   Collection Time: 12/05/24  4:05 AM   Specimen: BLOOD RIGHT WRIST  Result Value Ref Range Status   Specimen  Description   Final    BLOOD RIGHT WRIST Performed at [facility], [address], [place], [place] [address]    Special Requests   Final    BOTTLES DRAWN AEROBIC AND ANAEROBIC Blood Culture results may not be optimal due to an inadequate volume of blood received in culture bottles Performed at [facility], [address], [place], [place] [address]    Culture  Setup Time   Final    GRAM POSITIVE COCCI IN PAIRS IN BOTH AEROBIC AND ANAEROBIC BOTTLES CRITICAL RESULT CALLED TO, READ  BACK BY AND VERIFIED WITH: PHARMD M. BELL [id number] @ 2322 FH Performed at [facility], [address], [place], [place] [address]    Culture STREPTOCOCCUS PNEUMONIAE (A)  Final   Report Status 12/07/2024 FINAL  Final   Organism ID, Bacteria STREPTOCOCCUS PNEUMONIAE  Final      Susceptibility   Streptococcus pneumoniae - MIC*    ERYTHROMYCIN <=0.12 SENSITIVE Sensitive     LEVOFLOXACIN  0.5 SENSITIVE Sensitive     VANCOMYCIN  0.25 SENSITIVE Sensitive     PENICILLIN (meningitis) 0.25 RESISTANT Resistant     PENO - penicillin 0.25      PENICILLIN (non-meningitis) 0.25 SENSITIVE Sensitive     PENICILLIN (oral) 0.25 INTERMEDIATE Intermediate     CEFTRIAXONE  (non-meningitis) 0.25 SENSITIVE Sensitive     CEFTRIAXONE  (meningitis) 0.25 SENSITIVE Sensitive     * STREPTOCOCCUS PNEUMONIAE  Blood Culture ID Panel (Reflexed)     Status: Abnormal   Collection Time: 12/05/24  4:05 AM  Result Value Ref Range Status   Enterococcus faecalis NOT DETECTED NOT DETECTED Final   Enterococcus Faecium NOT DETECTED NOT DETECTED Final   Listeria monocytogenes NOT DETECTED NOT DETECTED Final   Staphylococcus species NOT DETECTED NOT DETECTED Final   Staphylococcus aureus (BCID) NOT DETECTED NOT DETECTED Final   Staphylococcus epidermidis NOT DETECTED NOT DETECTED Final   Staphylococcus lugdunensis NOT DETECTED NOT DETECTED Final   Streptococcus species DETECTED (A) NOT DETECTED Final    Comment:  CRITICAL RESULT CALLED TO, READ BACK BY AND VERIFIED WITH: PHARMD M. BELL [id number] @ 2322 FH    Streptococcus agalactiae NOT DETECTED NOT DETECTED Final   Streptococcus pneumoniae DETECTED (A) NOT DETECTED Final    Comment: CRITICAL RESULT CALLED TO, READ BACK BY AND VERIFIED WITH: PHARMD M. BELL [id number] @ 2322 FH    Streptococcus pyogenes NOT DETECTED NOT DETECTED Final   A.calcoaceticus-baumannii NOT DETECTED NOT DETECTED Final   Bacteroides fragilis NOT DETECTED NOT DETECTED Final   Enterobacterales NOT DETECTED NOT DETECTED Final   Enterobacter cloacae complex NOT DETECTED NOT DETECTED Final   Escherichia coli NOT DETECTED NOT DETECTED Final   Klebsiella aerogenes NOT DETECTED NOT DETECTED Final   Klebsiella oxytoca NOT DETECTED NOT DETECTED Final   Klebsiella pneumoniae NOT DETECTED NOT DETECTED Final   Proteus species NOT DETECTED NOT DETECTED Final   Salmonella species NOT DETECTED NOT DETECTED Final   Serratia marcescens NOT DETECTED NOT DETECTED Final   Haemophilus influenzae NOT DETECTED NOT DETECTED Final   Neisseria meningitidis NOT DETECTED NOT DETECTED Final   Pseudomonas aeruginosa NOT DETECTED NOT DETECTED Final   Stenotrophomonas maltophilia NOT DETECTED NOT DETECTED Final   Candida albicans NOT DETECTED NOT DETECTED Final   Candida auris NOT DETECTED NOT DETECTED Final   Candida glabrata NOT DETECTED NOT DETECTED Final   Candida krusei NOT DETECTED NOT DETECTED Final   Candida parapsilosis NOT DETECTED NOT DETECTED Final   Candida tropicalis NOT DETECTED NOT DETECTED Final   Cryptococcus neoformans/gattii NOT DETECTED NOT DETECTED Final    Comment: Performed at [facility], [address] [place], [place] [address]  Blood culture (routine x 2)     Status: None (Preliminary result)   Collection Time: 12/05/24  3:13 PM   Specimen: BLOOD RIGHT ARM  Result Value Ref Range Status   Specimen Description   Final    BLOOD RIGHT ARM Performed at [facility], [address], [place], [place] [address]    Special Requests  Final    BOTTLES DRAWN AEROBIC ONLY Blood Culture results may not be optimal due to an inadequate volume of blood received in culture bottles Performed at [facility], [address] [place], [place] [address]    Culture   Final    NO GROWTH 3 DAYS Performed at [facility], [address], [place], [place] [address]    Report Status PENDING  Incomplete  Culture, blood (Routine X 2) w Reflex to ID Panel     Status: None (Preliminary result)   Collection Time: 12/06/24  6:57 PM   Specimen: BLOOD RIGHT ARM  Result Value Ref Range Status   Specimen Description BLOOD RIGHT ARM  Final   Special Requests AEROBIC BOTTLE ONLY Blood Culture adequate volume  Final   Culture   Final    NO GROWTH 1 DAY Performed at [facility], [address] [place], [place] [address]    Report Status PENDING  Incomplete  Culture, blood (Routine X 2) w Reflex to ID Panel     Status: None (Preliminary result)   Collection Time: 12/06/24  6:57 PM   Specimen: BLOOD RIGHT ARM  Result Value Ref Range Status   Specimen Description BLOOD RIGHT ARM  Final   Special Requests AEROBIC BOTTLE ONLY Blood Culture adequate volume  Final   Culture   Final    NO GROWTH 1 DAY Performed at [facility], [address] [place], [place] [address]    Report Status PENDING  Incomplete         Radiology Studies: ECHOCARDIOGRAM COMPLETE Result Date: 12/07/2024    ECHOCARDIOGRAM REPORT   Patient Name:   Rachael Davis Date of Exam: 12/07/2024 Medical Rec #:  [id number]       Height:       62.0 in Accession #:    [id number]      Weight:       168.0 lb Date of Birth:  1956-09-21       BSA:          1.775 m Patient Age:    68 years        BP:           107/74 mmHg Patient Gender: F               HR:           85 bpm. Exam Location:  Inpatient Procedure: 2D Echo, Cardiac Doppler, Color Doppler and Intracardiac             Opacification Agent (Both Spectral and Color Flow Doppler were            utilized during procedure). Indications:    Endocarditis I38  History:        Patient has prior history of Echocardiogram examinations, most                 recent 09/17/2024. Mitral Valve Disease.  Sonographer:    Tinnie Gosling RDCS Referring Phys: [id number] GEORGANNA ARCHER IMPRESSIONS  1. Left ventricular ejection fraction, by estimation, is <20%. Left ventricular ejection fraction by 2D MOD biplane is 10.6 %. The left ventricle has severely decreased function. The left ventricle demonstrates global hypokinesis. The left ventricular internal cavity size was severely dilated. Diastology is indeterminate in the setting of MV replacement.  2. Right ventricular systolic function is severely reduced. The right ventricular size is moderately enlarged. There is normal pulmonary artery systolic pressure.  3.  Left atrial size was mildly dilated.  4. The mitral valve has been replaced. There is a 25 mm Magna Ease bioprosthetic valve present in the mitral position. The bioprosthesis appears to be well seated. On the left ventricular side of the mitral valve there is an independently mobile echodensity measuring 0.31 cm x 0.97 cm concerning for possible infective endocarditis. This was not visible on the echocardiogram dated 09/17/24. There is trivial mitral regurgitation. The gradients through the mitral valve are within normal limits.  5. The aortic valve is tricuspid. Aortic valve regurgitation is not visualized. No aortic stenosis is present.  6. The inferior vena cava is dilated in size with >50% respiratory variability, suggesting right atrial pressure of 8 mmHg. Comparison(s): Changes from prior study are noted. Possible endocarditis, severe RV systolic dysfunction. Conclusion(s)/Recommendation(s): Recommend TEE for better assessment of the MV given concern for endocarditis. FINDINGS  Left Ventricle: Left ventricular ejection fraction, by  estimation, is <20%. Left ventricular ejection fraction by 2D MOD biplane is 10.6 %. The left ventricle has severely decreased function. The left ventricle demonstrates global hypokinesis. The left ventricular internal cavity size was severely dilated. There is no left ventricular hypertrophy. Diastology is indeterminate in the setting of MV replacement. Right Ventricle: The right ventricular size is moderately enlarged. No increase in right ventricular wall thickness. Right ventricular systolic function is severely reduced. There is normal pulmonary artery systolic pressure. The tricuspid regurgitant velocity is 2.20 m/s, and with an assumed right atrial pressure of 8 mmHg, the estimated right ventricular systolic pressure is 27.4 mmHg. Left Atrium: Left atrial size was mildly dilated. Right Atrium: Right atrial size was normal in size. Pericardium: There is no evidence of pericardial effusion. Mitral Valve: The mitral valve has been replaced. There is a 25 mm Magna Ease bioprosthetic valve present in the mitral position. The bioprosthesis appears to be well seated. On the left ventricular side of the mitral valve there is an independently mobile echodensity measuring 0.31 cm x 0.97 cm concerning for possible infective endocarditis. This was not visible on the echocardiogram dated 09/17/24. There is trivial mitral regurgitation. The gradients through the mitral valve are within normal limits. MV peak gradient, 14.9 mmHg. The mean mitral valve gradient is 10.0 mmHg. Tricuspid Valve: The tricuspid valve is normal in structure. Tricuspid valve regurgitation is mild . No evidence of tricuspid stenosis. Aortic Valve: The aortic valve is tricuspid. Aortic valve regurgitation is not visualized. No aortic stenosis is present. There is no evidence of aortic valve vegetation. Pulmonic Valve: The pulmonic valve was normal in structure. Pulmonic valve regurgitation is not visualized. No evidence of pulmonic stenosis. There is  no evidence of pulmonic valve vegetation. Aorta: The aortic root and ascending aorta are structurally normal, with no evidence of dilitation. Venous: The inferior vena cava is dilated in size with greater than 50% respiratory variability, suggesting right atrial pressure of 8 mmHg. IAS/Shunts: No atrial level shunt detected by color flow Doppler. Additional Comments: A device lead is visualized.  LEFT VENTRICLE PLAX 2D                        Biplane EF (MOD) LVIDd:         5.80 cm         LV Biplane EF:   Left LVIDs:         5.60 cm  ventricular LV PW:         1.00 cm                          ejection LV IVS:        1.00 cm                          fraction by LVOT diam:     2.00 cm                          2D MOD LV SV:         47                               biplane is LV SV Index:   27                               10.6 %. LVOT Area:     3.14 cm LV IVRT:       140 msec        Diastology                                LV e' lateral:   4.68 cm/s                                LV E/e' lateral: 31.4 LV Volumes (MOD) LV vol d, MOD    204.0 ml A2C: LV vol d, MOD    211.0 ml A4C: LV vol s, MOD    196.0 ml A2C: LV vol s, MOD    176.0 ml A4C: LV SV MOD A2C:   8.0 ml LV SV MOD A4C:   211.0 ml LV SV MOD BP:    22.4 ml RIGHT VENTRICLE            IVC RV S prime:     8.38 cm/s  IVC diam: 2.10 cm TAPSE (M-mode): 0.7 cm LEFT ATRIUM             Index        RIGHT ATRIUM           Index LA Vol (A2C):   79.0 ml 44.50 ml/m  RA Area:     18.90 cm LA Vol (A4C):   48.0 ml 27.04 ml/m  RA Volume:   51.80 ml  29.18 ml/m LA Biplane Vol: 63.9 ml 36.00 ml/m  AORTIC VALVE LVOT Vmax:   89.40 cm/s LVOT Vmean:  55.900 cm/s LVOT VTI:    0.151 m  AORTA Ao Root diam: 2.90 cm Ao Asc diam:  3.20 cm MITRAL VALVE                TRICUSPID VALVE MV Area (PHT): 3.61 cm     TR Peak grad:   19.4 mmHg MV Area VTI:   0.92 cm     TR Vmax:        220.00 cm/s MV Peak grad:  14.9 mmHg MV Mean grad:  10.0 mmHg    SHUNTS MV Vmax:        1.93 m/s     Systemic VTI:  0.15 m MV Vmean:  151.0 cm/s   Systemic Diam: 2.00 cm MV E velocity: 147.00 cm/s MV A velocity: 159.00 cm/s MV E/A ratio:  0.92 Georganna Archer Electronically signed by Georganna Archer Signature Date/Time: 12/07/2024/1:57:33 PM    Final          Scheduled Meds:  albuterol   2.5 mg Nebulization TID   aspirin  EC  81 mg Oral Daily   atorvastatin   40 mg Oral Daily   carvedilol   12.5 mg Oral BID   fluticasone   2 spray Each Nare Daily   furosemide   80 mg Intravenous BID   insulin  aspart  0-15 Units Subcutaneous TID WC   insulin  aspart  0-5 Units Subcutaneous QHS   loratadine   10 mg Oral Daily   oseltamivir   30 mg Oral BID   pantoprazole   40 mg Oral Daily   sacubitril -valsartan   1 tablet Oral BID   warfarin  5 mg Oral q1600   Warfarin - Pharmacist Dosing Inpatient   Does not apply q1600   Continuous Infusions:  cefTRIAXone  (ROCEPHIN )  IV 2 g (12/07/24 2132)     LOS: 3 days    Time spent: 40 minutes    Toribio Hummer, MD [facility]   To contact the attending provider between 7A-7P or the covering provider during after hours 7P-7A, please log into the web site [email or web address] and access using universal [place] password for that web site. If you do not have the password, please call the hospital operator.  12/08/2024, 12:02 PM    "

## 2024-12-08 NOTE — Progress Notes (Signed)
 PHARMACY - ANTICOAGULATION CONSULT NOTE  Pharmacy Consult for warfarin Indication: atrial fibrillation  Allergies[1]  Patient Measurements: Height: 5' 2 (157.5 cm) Weight: 83.7 kg (184 lb 8.4 oz) IBW/kg (Calculated) : 50.1 HEPARIN  DW (KG): 68.2  Vital Signs: Temp: 98.2 F (36.8 C) (01/05 0438) Temp Source: Oral (01/05 0438) BP: 105/63 (01/05 0438) Pulse Rate: 68 (01/05 0438)  Labs: Recent Labs    12/06/24 0426 12/07/24 0434 12/08/24 0404  HGB 11.9* 12.3 12.4  HCT 38.7 39.9 40.3  PLT 160 191 193  LABPROT 29.5* 27.3* 30.6*  INR 2.7* 2.4* 2.8*  CREATININE 1.43* 1.47* 1.42*    Estimated Creatinine Clearance: 37.5 mL/min (A) (by C-G formula based on SCr of 1.42 mg/dL (H)).   Medical History: Past Medical History:  Diagnosis Date   Arrhythmia    Hx PAF, PVC's   CHF (congestive heart failure) (HCC)    Coronary artery disease    04/04/2017 STEMI   Diabetes [facility])    H/O mitral valve replacement 09/21/2021   Heart attack (HCC)    per pt reprot   Hyperlipidemia    Hypertension    New onset atrial fibrillation (HCC) 05/2017    Medications:  Warfarin 5 mg daily PTA Last dose 12/30 Dose consistent with last visit to anticoag clinic 12/11  Assessment: 69 year old patient with history of atrial fibrillation on warfarin PTA presents with shortness of breath, positive for influenza with concern for CAP. Blood cultures returned positive for strep pneumoniae.  12/08/2024 INR 2.8 - therapeutic No complications of therapy noted  Goal of Therapy:  INR 2-3 Monitor platelets by anticoagulation protocol: Yes   Plan:  -Continue home dose of warfarin - 5 mg daily -Daily INR for now  Stefano MARLA Bologna, PharmD, BCPS Clinical Pharmacist 12/08/2024 7:34 AM        [1]  Allergies Allergen Reactions   Keflex  [Cephalexin ] Rash    Skin on feet peeled.   Asa [Aspirin ]     325 mg ----heartburn Pt can take 81 mg   Enalapril Cough   Lisinopril  Cough

## 2024-12-08 NOTE — Progress Notes (Addendum)
 "  As below, patient seen and examined.  She continues with intermittent dyspnea but improved compared to admission.  Plan to continue Lasix  at present dose.  Continue Entresto , carvedilol , resume Farxiga  10 mg daily.  Patient apparently had hypokalemia with spironolactone  previously.  Transthoracic echocardiogram with possible vegetation on prosthetic mitral valve.  Plan to proceed with transesophageal echocardiogram today.  Note previous blood culture positive for strep pneumonia sensitive not commonly seen with ICD infections.  Plan to continue carvedilol  and Coumadin  given history of paroxysmal atrial fibrillation (transient atrial fibrillation noted on telemetry).  Rachael Shallow, MD    Progress Note  Patient Name: Rachael Davis Date of Encounter: 12/08/2024 [facility] Cardiologist: Rachael Scarce, MD   Interval Summary   Reports feeling better this morning in terms of breathing Still has episodes of coughing Explained TEE procedure, patient is wanting to proceed today  Remain NPO until procedure  Vital Signs Vitals:   12/07/24 2257 12/08/24 0438 12/08/24 0500 12/08/24 0817  BP: 121/77 105/63    Pulse: 73 68    Resp:      Temp:  98.2 F (36.8 C)    TempSrc:  Oral    SpO2: 100% 99%  100%  Weight:   83.7 kg   Height:        Intake/Output Summary (Last 24 hours) at 12/08/2024 0900 Last data filed at 12/08/2024 0600 Gross per 24 hour  Intake 325 ml  Output 1700 ml  Net -1375 ml      12/08/2024    5:00 AM 12/07/2024    5:00 AM 12/05/2024    4:37 AM  Last 3 Weights  Weight (lbs) 184 lb 8.4 oz 167 lb 15.9 oz 179 lb  Weight (kg) 83.7 kg 76.2 kg 81.194 kg     Telemetry/ECG  Sinus with frequent ectopy, intermittently telemetry appears to be in Afib but may just be frequent ectopy- Personally Reviewed  Physical Exam  GEN: No acute distress.   Neck: No JVD Cardiac: RRR, no murmurs, rubs, or gallops.  Respiratory: decreased at bases. GI: Soft, nontender,  non-distended  MS: soft nonpitting bilateral edema  Assessment & Plan   Acute on chronic HFrEF Home meds: Carvedilol  12.5 mg BID, Farxiga  10 mg daily, Entresto  24-26 mg BID, Imdur  30 mg daily, torsemide  40 mg daily with 20 mEq K daily  Echo 09/2024: LVEF <20%, global hypokinesis, normal RV systolic function, s/p MVR with normal structure/function of prosthesis, no other valvular abnormalities, normal IVC proBNP significantly elevated 29,984 Exacerbation in the setting of medication noncompliance, influenza A Renal function slightly improving, 1.42 today Net - 3.2 L this admission, with 2L out yesterday  Started on IV diuresis, not entirely robust response, continue on IV Lasix  80 mg twice daily Continue carvedilol  12.5 mg twice daily Continue Entresto  24-26 mg twice daily Presently holding Farxiga  and Imdur  in the setting of strep pneumonia bacteremia Previously did not tolerate spironolactone  given hyperkalemia NPO today until TEE procedure later today    CAD s/p CABG x 4 LIMA ? LAD, SVG ? RI, SVG ? OM, SVG ? RCA 04/2017 Home meds: Aspirin  81 mg daily, Lipitor  40 mg daily, Imdur  30 mg daily Denies any chest pain  EKG without changes compared to prior tracings  R/LHC 2023: Patent LIMA to LAD, SVG to OM1, occluded SVG to RCA, occluded SVG to ramus, normal LVEDP Patient denies any chest pain Continue aspirin  81 mg daily Continue Lipitor  40 mg daily Holding Imdur  for blood pressures Holding Farxiga   for bacteremia   Bioprosthetic MVR Echo 09/2024 showed no MR/MS, mean gradient 4 mmHg Continue to follow as an outpatient    History of VT s/p ICD RBBB, SVT, NSVT First-degree AV block Follows closely with our EP team as an outpatient S/p [employer] single-chamber ICD ZIO monitor 01/2024: Sinus rhythm with first-degree AV block, occasional V pacing, NSVT x 8 episodes, SVT x 60 episodes, occasional PVCs, no atrial fibrillation detected   Paroxysmal atrial fibrillation,  remote Noting s/p CABG in 2018 Home meds: Warfarin, carvedilol  12.5 mg BID ZIO monitor 01/2024: Sinus rhythm with first-degree AV block, occasional V pacing, NSVT x 8 episodes, SVT x 60 episodes, occasional PVCs, no atrial fibrillation detected Continue carvedilol  12.5 mg twice daily Continue warfarin per pharmacy   Bacteria with concerns for endocarditis Echo this admission showed: LVEF <20%, global hypokinesis, severely reduced RV systolic function, concern for possible infective endocarditis of the mitral valve with independently mobile echodensity measuring 0.31 cm x 0.97 cm not visible on previous echo from 09/2024 NPO since midnight Plan for TEE today to further evaluate  Informed Consent   Shared Decision Making/Informed Consent   The risks [esophageal damage, perforation (1:10,000 risk), bleeding, pharyngeal hematoma as well as other potential complications associated with conscious sedation including aspiration, arrhythmia, respiratory failure and death], benefits (treatment guidance and diagnostic support) and alternatives of a transesophageal echocardiogram were discussed in detail with Rachael Davis and she is willing to proceed.       Per primary Strep pneumonia bacteremia Influenza A infection CKD stage IIIa Type II diabetes Community-acquired pneumonia Lactic acidosis  For questions or updates, please contact [facility] Please consult [email or web address] for contact info under       Signed, Rachael DELENA Donath, PA-C   "

## 2024-12-08 NOTE — Interval H&P Note (Signed)
 History and Physical Interval Note:  12/08/2024 12:50 PM  Rachael Davis  has presented today for surgery, with the diagnosis of endocarditis.  The various methods of treatment have been discussed with the patient and family. After consideration of risks, benefits and other options for treatment, the patient has consented to  Procedures: TRANSESOPHAGEAL ECHOCARDIOGRAM (N/A) as a surgical intervention.  The patient's history has been reviewed, patient examined, no change in status, stable for surgery.  I have reviewed the patient's chart and labs.  Questions were answered to the patient's satisfaction.     Annalisia Ingber A Danese Dorsainvil

## 2024-12-08 NOTE — Transfer of Care (Signed)
 Immediate Anesthesia Transfer of Care Note  Patient: Rachael Davis  Procedure(s) Performed: TRANSESOPHAGEAL ECHOCARDIOGRAM  Patient Location: Cath Lab  Anesthesia Type:MAC  Level of Consciousness: awake, alert , and oriented  Airway & Oxygen Therapy: Patient Spontanous Breathing and Patient connected to nasal cannula oxygen  Post-op Assessment: Report given to RN and Post -op Vital signs reviewed and stable  Post vital signs: Reviewed and stable  Last Vitals:  Vitals Value Taken Time  BP    Temp    Pulse 71 12/08/24 13:53  Resp 20 12/08/24 13:53  SpO2 93 % 12/08/24 13:53  Vitals shown include unfiled device data.  Last Pain:  Vitals:   12/08/24 1235  TempSrc: Tympanic  PainSc: 0-No pain         Complications: There were no known notable events for this encounter.

## 2024-12-08 NOTE — Anesthesia Postprocedure Evaluation (Signed)
"   Anesthesia Post Note  Patient: Rachael Davis  Procedure(s) Performed: TRANSESOPHAGEAL ECHOCARDIOGRAM     Patient location during evaluation: Cath Lab Anesthesia Type: MAC Level of consciousness: awake Pain management: pain level controlled Vital Signs Assessment: post-procedure vital signs reviewed and stable Respiratory status: spontaneous breathing, nonlabored ventilation and respiratory function stable Cardiovascular status: blood pressure returned to baseline and stable Postop Assessment: no apparent nausea or vomiting Anesthetic complications: no   There were no known notable events for this encounter.  Last Vitals:  Vitals:   12/08/24 1555 12/08/24 1917  BP: 111/68   Pulse: 71   Resp: (!) 22   Temp: 36.8 C   SpO2: 100% 100%    Last Pain:  Vitals:   12/08/24 1555  TempSrc: Oral  PainSc: 0-No pain                 Karesha Trzcinski P Sareen Randon      "

## 2024-12-08 NOTE — Procedures (Signed)
" ° ° °  TRANSESOPHAGEAL ECHOCARDIOGRAM   NAME:  Rachael Davis    MRN: [id number] DOB:  19-Apr-1956    ADMIT DATE: 12/05/2024  INDICATIONS: S/p ICD; evaluation of s/p MV  PROCEDURE:   Informed consent was obtained prior to the procedure. The risks, benefits and alternatives for the procedure were discussed and the patient comprehended these risks.  Risks include, but are not limited to, cough, sore throat, vomiting, nausea, somnolence, esophageal and stomach trauma or perforation, bleeding, low blood pressure, aspiration, pneumonia, infection, trauma to the teeth and death.    Procedural time out performed.   Anesthesia was administered by Dr. Norlin and team.   The patient's heart rate, blood pressure, and oxygen saturation are monitored continuously during the procedure.   The transesophageal probe was inserted in the esophagus and stomach without difficulty and multiple views were obtained.   COMPLICATIONS:    There were no immediate complications.  KEY FINDINGS:  Small aortic valve vegetation.  No prosthetic mitral valve vegetation seen on this study.  Normal device lead.  Full report to follow. Further management per primary team.   Stanly Leavens, MD [place]  [facility]  1:54 PM   "

## 2024-12-08 NOTE — Progress Notes (Signed)
 "                                                            [facility] Infectious Diseases Follow Up Note  Patient Identification: Patient Name: Rachael Davis MRN: [id number] Admit Date: 12/05/2024  3:07 AM Age: 69 y.o.Today's Date: 12/08/2024  Reason for Visit: streptococcus bacteremia   Principal Problem:   Acute CHF (congestive heart failure) (HCC) Active Problems:   Paroxysmal atrial fibrillation (HCC)   Acute systolic (congestive) heart failure (HCC)   Bacteremia due to Streptococcus pneumoniae   Community acquired pneumonia   Influenza A with pneumonia   Severe sepsis (HCC)   S/P mitral valve replacement with bioprosthetic valve   Type 2 diabetes mellitus with stage 3a chronic kidney disease, without long-term current use of insulin  (HCC)   Endocarditis  Antibiotics: Oseltamivir  1/1- Ceftriaxone  1/2- Azithromycin  1/1  Lines/Hardwares:   Interval Events: Remains afebrile. Labs remarkable for Cr down to 1.42, WBC 7.1   Assessment 69 year old female with prior history as below including A-fib on AC, CHF, DM, HTN, s/p bioprosthetic MVR, s/p VT s/p ICD admitted with  # Influenza A pneumonia with secondary streptococcal  - 1/3 blood cx NG in 2 days  - 1/4 TTE On the left ventricular side of the mitral  valve there is an independently mobile  echodensity measuring 0.31 cm x 0.97 cm concerning for possible infective  endocarditis. This was not visible on the echocardiogram dated 09/17/24.  - 1/5 TEE Small aortic valve vegetation.  No prosthetic mitral valve vegetation seen on this study.  Normal device lead.   # Acute on chronic CHF  # p A Fib - Cardiology following   # CKD - cr at baseline   Recommendations - continue IV ceftriaxone . sensitivities reviewed  - fu repeat blood cultures - complete course of oseltamivir  - EP is aware and following remotely  - monitor CBC and BMP - droplet precautions D/w Cardiology Following  Rest of the management as per the primary  team. Thank you for the consult. Please page with pertinent questions or concerns.  ______________________________________________________________________ Subjective patient seen and examined at the bedside. Feels better, cough and breathing improving   Past Medical History:  Diagnosis Date   Arrhythmia    Hx PAF, PVC's   CHF (congestive heart failure) (HCC)    Coronary artery disease    04/04/2017 STEMI   Diabetes [facility])    H/O mitral valve replacement 09/21/2021   Heart attack (HCC)    per pt reprot   Hyperlipidemia    Hypertension    New onset atrial fibrillation (HCC) 05/2017   Past Surgical History:  Procedure Laterality Date   CORONARY ARTERY BYPASS GRAFT N/A 04/12/2017   Procedure: CORONARY ARTERY BYPASS GRAFTING (CABG), ON PUMP, TIMES FOUR, USING LEFT INTERNAL MAMMARY ARTERY AND ENDOSCOPICALLY HARVESTED RIGHT GREATER SAPHENOUS VEIN;  Surgeon: Fleeta Hanford Coy, MD;  Location: [facility] OR;  Service: Open Heart Surgery;  Laterality: N/A;  LIMA to LAD; SVG to OM; SVG to Ramus; SVG to Right   ICD IMPLANT N/A 06/30/2022   Procedure: ICD IMPLANT;  Surgeon: Fernande Elspeth JAYSON, MD;  Location: [facility] INVASIVE CV LAB;  Service: Cardiovascular;  Laterality: N/A;   IR THORACENTESIS ASP PLEURAL SPACE W/IMG GUIDE  05/25/2017   LEFT HEART CATH AND  CORONARY ANGIOGRAPHY N/A 04/04/2017   Procedure: Left Heart Cath and Coronary Angiography;  Surgeon: Peter M Jordan, MD;  Location: [facility] INVASIVE CV LAB;  Service: Cardiovascular;  Laterality: N/A;   MITRAL VALVE REPAIR N/A 04/12/2017   Procedure: MITRAL VALVE REPLACEMENT (MVR);  Surgeon: Fleeta Ochoa, Maude, MD;  Location: [facility] OR;  Service: Open Heart Surgery;  Laterality: N/A;  Using a 25mm Magna Ease Mitral Pericardial Bioprosthesis   RIGHT/LEFT HEART CATH AND CORONARY/GRAFT ANGIOGRAPHY N/A 04/20/2022   Procedure: RIGHT/LEFT HEART CATH AND CORONARY/GRAFT ANGIOGRAPHY;  Surgeon: Cherrie Toribio SAUNDERS, MD;  Location: [facility] INVASIVE CV LAB;  Service: Cardiovascular;  Laterality:  N/A;   TEE WITHOUT CARDIOVERSION N/A 04/12/2017   Procedure: TRANSESOPHAGEAL ECHOCARDIOGRAM (TEE);  Surgeon: Fleeta Ochoa, Maude, MD;  Location: [facility] OR;  Service: Open Heart Surgery;  Laterality: N/A;   TUBAL LIGATION      Vitals BP 105/63 (BP Location: Left Arm)   Pulse 68   Temp 98.2 F (36.8 C) (Oral)   Resp 15   Ht 5' 2 (1.575 m)   Wt 83.7 kg   SpO2 100%   BMI 33.75 kg/m     Physical Exam Constitutional:  adult female sitting in the recliner, non toxic appearing     Comments: HEENT wnl   Cardiovascular:     Rate and Rhythm: Normal rate    Heart sounds: s1s2  Pulmonary:     Effort: Pulmonary effort is normal.     Comments: Normal breath sounds  Abdominal:     Palpations: Abdomen is soft.     Tenderness: non distended and non tender   Musculoskeletal:        General: No swelling or tenderness in peripheral joints   Skin:    Comments: no rashes   Neurological:     General: awake, alert and oriented, non focal   Psychiatric:        Mood and Affect: Mood normal.   Pertinent Microbiology Results for orders placed or performed during the hospital encounter of 12/05/24  Resp panel by RT-PCR (RSV, Flu A&B, Covid) Anterior Nasal Swab     Status: Abnormal   Collection Time: 12/05/24  3:44 AM   Specimen: Anterior Nasal Swab  Result Value Ref Range Status   SARS Coronavirus 2 by RT PCR NEGATIVE NEGATIVE Final    Comment: (NOTE) SARS-CoV-2 target nucleic acids are NOT DETECTED.  The SARS-CoV-2 RNA is generally detectable in upper respiratory specimens during the acute phase of infection. The lowest concentration of SARS-CoV-2 viral copies this assay can detect is 138 copies/mL. A negative result does not preclude SARS-Cov-2 infection and should not be used as the sole basis for treatment or other patient management decisions. A negative result may occur with  improper specimen collection/handling, submission of specimen other than nasopharyngeal swab, presence of  viral mutation(s) within the areas targeted by this assay, and inadequate number of viral copies(<138 copies/mL). A negative result must be combined with clinical observations, patient history, and epidemiological information. The expected result is Negative.  Fact Sheet for Patients:  bloggercourse.com  Fact Sheet for Healthcare Providers:  seriousbroker.it  This test is no t yet approved or cleared by the [place]  FDA and  has been authorized for detection and/or diagnosis of SARS-CoV-2 by FDA under an Emergency Use Authorization (EUA). This EUA will remain  in effect (meaning this test can be used) for the duration of the COVID-19 declaration under Section 564(b)(1) of the Act, 21 U.S.C.section 360bbb-3(b)(1), unless the authorization is  terminated  or revoked sooner.       Influenza A by PCR POSITIVE (A) NEGATIVE Final   Influenza B by PCR NEGATIVE NEGATIVE Final    Comment: (NOTE) The Xpert Xpress SARS-CoV-2/FLU/RSV plus assay is intended as an aid in the diagnosis of influenza from Nasopharyngeal swab specimens and should not be used as a sole basis for treatment. Nasal washings and aspirates are unacceptable for Xpert Xpress SARS-CoV-2/FLU/RSV testing.  Fact Sheet for Patients: bloggercourse.com  Fact Sheet for Healthcare Providers: seriousbroker.it  This test is not yet approved or cleared by the [place]  FDA and has been authorized for detection and/or diagnosis of SARS-CoV-2 by FDA under an Emergency Use Authorization (EUA). This EUA will remain in effect (meaning this test can be used) for the duration of the COVID-19 declaration under Section 564(b)(1) of the Act, 21 U.S.C. section 360bbb-3(b)(1), unless the authorization is terminated or revoked.     Resp Syncytial Virus by PCR NEGATIVE NEGATIVE Final    Comment: (NOTE) Fact Sheet for  Patients: bloggercourse.com  Fact Sheet for Healthcare Providers: seriousbroker.it  This test is not yet approved or cleared by the [place]  FDA and has been authorized for detection and/or diagnosis of SARS-CoV-2 by FDA under an Emergency Use Authorization (EUA). This EUA will remain in effect (meaning this test can be used) for the duration of the COVID-19 declaration under Section 564(b)(1) of the Act, 21 U.S.C. section 360bbb-3(b)(1), unless the authorization is terminated or revoked.  Performed at [facility], [address], [place], [place] [address]   Blood culture (routine x 2)     Status: Abnormal   Collection Time: 12/05/24  4:05 AM   Specimen: BLOOD RIGHT WRIST  Result Value Ref Range Status   Specimen Description   Final    BLOOD RIGHT WRIST Performed at [facility], [address], [place], [place] [address]    Special Requests   Final    BOTTLES DRAWN AEROBIC AND ANAEROBIC Blood Culture results may not be optimal due to an inadequate volume of blood received in culture bottles Performed at [facility], [address], [place], [place] [address]    Culture  Setup Time   Final    GRAM POSITIVE COCCI IN PAIRS IN BOTH AEROBIC AND ANAEROBIC BOTTLES CRITICAL RESULT CALLED TO, READ BACK BY AND VERIFIED WITH: PHARMD M. BELL [id number] @ 2322 FH Performed at [facility], [address], [place], [place] [address]    Culture STREPTOCOCCUS PNEUMONIAE (A)  Final   Report Status 12/07/2024 FINAL  Final   Organism ID, Bacteria STREPTOCOCCUS PNEUMONIAE  Final      Susceptibility   Streptococcus pneumoniae - MIC*    ERYTHROMYCIN <=0.12 SENSITIVE Sensitive     LEVOFLOXACIN  0.5 SENSITIVE Sensitive     VANCOMYCIN  0.25 SENSITIVE Sensitive     PENICILLIN (meningitis) 0.25 RESISTANT Resistant     PENO - penicillin 0.25      PENICILLIN (non-meningitis) 0.25 SENSITIVE Sensitive      PENICILLIN (oral) 0.25 INTERMEDIATE Intermediate     CEFTRIAXONE  (non-meningitis) 0.25 SENSITIVE Sensitive     CEFTRIAXONE  (meningitis) 0.25 SENSITIVE Sensitive     * STREPTOCOCCUS PNEUMONIAE  Blood Culture ID Panel (Reflexed)     Status: Abnormal   Collection Time: 12/05/24  4:05 AM  Result Value Ref Range Status   Enterococcus faecalis NOT DETECTED NOT DETECTED Final   Enterococcus Faecium NOT DETECTED NOT DETECTED Final   Listeria monocytogenes NOT  DETECTED NOT DETECTED Final   Staphylococcus species NOT DETECTED NOT DETECTED Final   Staphylococcus aureus (BCID) NOT DETECTED NOT DETECTED Final   Staphylococcus epidermidis NOT DETECTED NOT DETECTED Final   Staphylococcus lugdunensis NOT DETECTED NOT DETECTED Final   Streptococcus species DETECTED (A) NOT DETECTED Final    Comment: CRITICAL RESULT CALLED TO, READ BACK BY AND VERIFIED WITH: PHARMD M. BELL [id number] @ 2322 FH    Streptococcus agalactiae NOT DETECTED NOT DETECTED Final   Streptococcus pneumoniae DETECTED (A) NOT DETECTED Final    Comment: CRITICAL RESULT CALLED TO, READ BACK BY AND VERIFIED WITH: PHARMD M. BELL [id number] @ 2322 FH    Streptococcus pyogenes NOT DETECTED NOT DETECTED Final   A.calcoaceticus-baumannii NOT DETECTED NOT DETECTED Final   Bacteroides fragilis NOT DETECTED NOT DETECTED Final   Enterobacterales NOT DETECTED NOT DETECTED Final   Enterobacter cloacae complex NOT DETECTED NOT DETECTED Final   Escherichia coli NOT DETECTED NOT DETECTED Final   Klebsiella aerogenes NOT DETECTED NOT DETECTED Final   Klebsiella oxytoca NOT DETECTED NOT DETECTED Final   Klebsiella pneumoniae NOT DETECTED NOT DETECTED Final   Proteus species NOT DETECTED NOT DETECTED Final   Salmonella species NOT DETECTED NOT DETECTED Final   Serratia marcescens NOT DETECTED NOT DETECTED Final   Haemophilus influenzae NOT DETECTED NOT DETECTED Final   Neisseria meningitidis NOT DETECTED NOT DETECTED Final   Pseudomonas aeruginosa NOT  DETECTED NOT DETECTED Final   Stenotrophomonas maltophilia NOT DETECTED NOT DETECTED Final   Candida albicans NOT DETECTED NOT DETECTED Final   Candida auris NOT DETECTED NOT DETECTED Final   Candida glabrata NOT DETECTED NOT DETECTED Final   Candida krusei NOT DETECTED NOT DETECTED Final   Candida parapsilosis NOT DETECTED NOT DETECTED Final   Candida tropicalis NOT DETECTED NOT DETECTED Final   Cryptococcus neoformans/gattii NOT DETECTED NOT DETECTED Final    Comment: Performed at [facility], [address], [place], [place] [address]  Blood culture (routine x 2)     Status: None (Preliminary result)   Collection Time: 12/05/24  3:13 PM   Specimen: BLOOD RIGHT ARM  Result Value Ref Range Status   Specimen Description   Final    BLOOD RIGHT ARM Performed at [facility], [address], [place], [place] [address]    Special Requests   Final    BOTTLES DRAWN AEROBIC ONLY Blood Culture results may not be optimal due to an inadequate volume of blood received in culture bottles Performed at [facility], [address], [place], [place] [address]    Culture   Final    NO GROWTH 3 DAYS Performed at [facility], [address], [place], [place] [address]    Report Status PENDING  Incomplete  Culture, blood (Routine X 2) w Reflex to ID Panel     Status: None (Preliminary result)   Collection Time: 12/06/24  6:57 PM   Specimen: BLOOD RIGHT ARM  Result Value Ref Range Status   Specimen Description BLOOD RIGHT ARM  Final   Special Requests AEROBIC BOTTLE ONLY Blood Culture adequate volume  Final   Culture   Final    NO GROWTH 1 DAY Performed at [facility], [address] [place], [place] [address]    Report Status PENDING  Incomplete  Culture, blood (Routine X 2) w Reflex to ID Panel     Status: None (Preliminary result)   Collection Time: 12/06/24  6:57 PM  Specimen: BLOOD RIGHT ARM  Result Value Ref Range Status   Specimen  Description BLOOD RIGHT ARM  Final   Special Requests AEROBIC BOTTLE ONLY Blood Culture adequate volume  Final   Culture   Final    NO GROWTH 1 DAY Performed at [facility], [address], [place], [place] [address]    Report Status PENDING  Incomplete   Pertinent Lab.    Latest Ref Rng & Units 12/08/2024    4:04 AM 12/07/2024    4:34 AM 12/06/2024    4:26 AM  CBC  WBC 4.0 - 10.5 K/uL 7.1  10.3  15.9   Hemoglobin 12.0 - 15.0 g/dL 87.5  87.6  88.0   Hematocrit 36.0 - 46.0 % 40.3  39.9  38.7   Platelets 150 - 400 K/uL 193  191  160       Latest Ref Rng & Units 12/08/2024    4:04 AM 12/07/2024    4:34 AM 12/06/2024    4:26 AM  CMP  Glucose 70 - 99 mg/dL 856  886  872   BUN 8 - 23 mg/dL 35  36  35   Creatinine 0.44 - 1.00 mg/dL 8.57  8.52  8.56   Sodium 135 - 145 mmol/L 140  139  138   Potassium 3.5 - 5.1 mmol/L 3.6  4.1  4.5   Chloride 98 - 111 mmol/L 103  103  105   CO2 22 - 32 mmol/L 25  24  22    Calcium  8.9 - 10.3 mg/dL 8.5  8.7  8.6       Latest Ref Rng & Units 12/08/2024    4:04 AM 12/07/2024    4:34 AM 12/06/2024    4:26 AM  CMP  Glucose 70 - 99 mg/dL 856  886  872   BUN 8 - 23 mg/dL 35  36  35   Creatinine 0.44 - 1.00 mg/dL 8.57  8.52  8.56   Sodium 135 - 145 mmol/L 140  139  138   Potassium 3.5 - 5.1 mmol/L 3.6  4.1  4.5   Chloride 98 - 111 mmol/L 103  103  105   CO2 22 - 32 mmol/L 25  24  22    Calcium  8.9 - 10.3 mg/dL 8.5  8.7  8.6    Pertinent Imaging today Plain films and CT images have been personally visualized and interpreted; radiology reports have been reviewed. Decision making incorporated into the Impression  ECHOCARDIOGRAM COMPLETE Result Date: 12/07/2024    ECHOCARDIOGRAM REPORT   Patient Name:   MAYKAYLA HIGHLEY Date of Exam: 12/07/2024 Medical Rec #:  [id number]       Height:       62.0 in Accession #:    [id number]      Weight:       168.0 lb Date of Birth:  08/23/1956       BSA:          1.775 m Patient Age:    68 years        BP:           107/74 mmHg  Patient Gender: F               HR:           85 bpm. Exam Location:  Inpatient Procedure: 2D Echo, Cardiac Doppler, Color Doppler and Intracardiac            Opacification Agent (Both Spectral and  Color Flow Doppler were            utilized during procedure). Indications:    Endocarditis I38  History:        Patient has prior history of Echocardiogram examinations, most                 recent 09/17/2024. Mitral Valve Disease.  Sonographer:    Tinnie Gosling RDCS Referring Phys: [id number] GEORGANNA ARCHER IMPRESSIONS  1. Left ventricular ejection fraction, by estimation, is <20%. Left ventricular ejection fraction by 2D MOD biplane is 10.6 %. The left ventricle has severely decreased function. The left ventricle demonstrates global hypokinesis. The left ventricular internal cavity size was severely dilated. Diastology is indeterminate in the setting of MV replacement.  2. Right ventricular systolic function is severely reduced. The right ventricular size is moderately enlarged. There is normal pulmonary artery systolic pressure.  3. Left atrial size was mildly dilated.  4. The mitral valve has been replaced. There is a 25 mm Magna Ease bioprosthetic valve present in the mitral position. The bioprosthesis appears to be well seated. On the left ventricular side of the mitral valve there is an independently mobile echodensity measuring 0.31 cm x 0.97 cm concerning for possible infective endocarditis. This was not visible on the echocardiogram dated 09/17/24. There is trivial mitral regurgitation. The gradients through the mitral valve are within normal limits.  5. The aortic valve is tricuspid. Aortic valve regurgitation is not visualized. No aortic stenosis is present.  6. The inferior vena cava is dilated in size with >50% respiratory variability, suggesting right atrial pressure of 8 mmHg. Comparison(s): Changes from prior study are noted. Possible endocarditis, severe RV systolic dysfunction.  Conclusion(s)/Recommendation(s): Recommend TEE for better assessment of the MV given concern for endocarditis. FINDINGS  Left Ventricle: Left ventricular ejection fraction, by estimation, is <20%. Left ventricular ejection fraction by 2D MOD biplane is 10.6 %. The left ventricle has severely decreased function. The left ventricle demonstrates global hypokinesis. The left ventricular internal cavity size was severely dilated. There is no left ventricular hypertrophy. Diastology is indeterminate in the setting of MV replacement. Right Ventricle: The right ventricular size is moderately enlarged. No increase in right ventricular wall thickness. Right ventricular systolic function is severely reduced. There is normal pulmonary artery systolic pressure. The tricuspid regurgitant velocity is 2.20 m/s, and with an assumed right atrial pressure of 8 mmHg, the estimated right ventricular systolic pressure is 27.4 mmHg. Left Atrium: Left atrial size was mildly dilated. Right Atrium: Right atrial size was normal in size. Pericardium: There is no evidence of pericardial effusion. Mitral Valve: The mitral valve has been replaced. There is a 25 mm Magna Ease bioprosthetic valve present in the mitral position. The bioprosthesis appears to be well seated. On the left ventricular side of the mitral valve there is an independently mobile echodensity measuring 0.31 cm x 0.97 cm concerning for possible infective endocarditis. This was not visible on the echocardiogram dated 09/17/24. There is trivial mitral regurgitation. The gradients through the mitral valve are within normal limits. MV peak gradient, 14.9 mmHg. The mean mitral valve gradient is 10.0 mmHg. Tricuspid Valve: The tricuspid valve is normal in structure. Tricuspid valve regurgitation is mild . No evidence of tricuspid stenosis. Aortic Valve: The aortic valve is tricuspid. Aortic valve regurgitation is not visualized. No aortic stenosis is present. There is no evidence of  aortic valve vegetation. Pulmonic Valve: The pulmonic valve was normal in structure. Pulmonic valve regurgitation is not visualized. No  evidence of pulmonic stenosis. There is no evidence of pulmonic valve vegetation. Aorta: The aortic root and ascending aorta are structurally normal, with no evidence of dilitation. Venous: The inferior vena cava is dilated in size with greater than 50% respiratory variability, suggesting right atrial pressure of 8 mmHg. IAS/Shunts: No atrial level shunt detected by color flow Doppler. Additional Comments: A device lead is visualized.  LEFT VENTRICLE PLAX 2D                        Biplane EF (MOD) LVIDd:         5.80 cm         LV Biplane EF:   Left LVIDs:         5.60 cm                          ventricular LV PW:         1.00 cm                          ejection LV IVS:        1.00 cm                          fraction by LVOT diam:     2.00 cm                          2D MOD LV SV:         47                               biplane is LV SV Index:   27                               10.6 %. LVOT Area:     3.14 cm LV IVRT:       140 msec        Diastology                                LV e' lateral:   4.68 cm/s                                LV E/e' lateral: 31.4 LV Volumes (MOD) LV vol d, MOD    204.0 ml A2C: LV vol d, MOD    211.0 ml A4C: LV vol s, MOD    196.0 ml A2C: LV vol s, MOD    176.0 ml A4C: LV SV MOD A2C:   8.0 ml LV SV MOD A4C:   211.0 ml LV SV MOD BP:    22.4 ml RIGHT VENTRICLE            IVC RV S prime:     8.38 cm/s  IVC diam: 2.10 cm TAPSE (M-mode): 0.7 cm LEFT ATRIUM             Index        RIGHT ATRIUM           Index LA Vol (A2C):   79.0 ml 44.50 ml/m  RA Area:  18.90 cm LA Vol (A4C):   48.0 ml 27.04 ml/m  RA Volume:   51.80 ml  29.18 ml/m LA Biplane Vol: 63.9 ml 36.00 ml/m  AORTIC VALVE LVOT Vmax:   89.40 cm/s LVOT Vmean:  55.900 cm/s LVOT VTI:    0.151 m  AORTA Ao Root diam: 2.90 cm Ao Asc diam:  3.20 cm MITRAL VALVE                TRICUSPID VALVE MV  Area (PHT): 3.61 cm     TR Peak grad:   19.4 mmHg MV Area VTI:   0.92 cm     TR Vmax:        220.00 cm/s MV Peak grad:  14.9 mmHg MV Mean grad:  10.0 mmHg    SHUNTS MV Vmax:       1.93 m/s     Systemic VTI:  0.15 m MV Vmean:      151.0 cm/s   Systemic Diam: 2.00 cm MV E velocity: 147.00 cm/s MV A velocity: 159.00 cm/s MV E/A ratio:  0.92 Georganna Archer Electronically signed by Georganna Archer Signature Date/Time: 12/07/2024/1:57:33 PM    Final    DG Chest Portable 1 View Result Date: 12/05/2024 EXAM: 1 VIEW(S) XRAY OF THE CHEST 12/05/2024 03:31:00 AM COMPARISON: 09/15/2024 CLINICAL HISTORY: sob FINDINGS: LUNGS AND PLEURA: Perihilar and interstitial opacities. Possible left pleural effusion. No pneumothorax. HEART AND MEDIASTINUM: Cardiomegaly. Left subclavian cardiac rhythm device noted. BONES AND SOFT TISSUES: Median sternotomy noted. No acute osseous abnormality. IMPRESSION: 1. Cardiomegaly with left subclavian cardiac rhythm device, similar to 09/15/2024. 2. Perihilar and interstitial opacities, which may reflect pulmonary edema or pneumonia. 3. Possible left pleural effusion. Electronically signed by: Evalene Coho MD 12/05/2024 04:08 AM EST RP Workstation: [id number]   I personally spent a total of 50 minutes in the care of the patient today including preparing to see the patient, getting/reviewing separately obtained history, performing a medically appropriate exam/evaluation, counseling and educating, placing orders, referring and communicating with other health care professionals, documenting clinical information in the EHR, independently interpreting results, and communicating results.     Electronically signed by:   Annalee Orem, MD Infectious Disease Physician [facility] for Infectious Disease Pager: [phone]  "

## 2024-12-09 ENCOUNTER — Encounter (HOSPITAL_COMMUNITY): Payer: Self-pay | Admitting: Internal Medicine

## 2024-12-09 DIAGNOSIS — E1122 Type 2 diabetes mellitus with diabetic chronic kidney disease: Secondary | ICD-10-CM | POA: Diagnosis not present

## 2024-12-09 DIAGNOSIS — J189 Pneumonia, unspecified organism: Secondary | ICD-10-CM | POA: Diagnosis not present

## 2024-12-09 DIAGNOSIS — J09X1 Influenza due to identified novel influenza A virus with pneumonia: Secondary | ICD-10-CM | POA: Diagnosis not present

## 2024-12-09 DIAGNOSIS — I5021 Acute systolic (congestive) heart failure: Secondary | ICD-10-CM | POA: Diagnosis not present

## 2024-12-09 LAB — CBC WITH DIFFERENTIAL/PLATELET
Abs Immature Granulocytes: 0.03 K/uL (ref 0.00–0.07)
Basophils Absolute: 0 K/uL (ref 0.0–0.1)
Basophils Relative: 0 %
Eosinophils Absolute: 0.1 K/uL (ref 0.0–0.5)
Eosinophils Relative: 2 %
HCT: 38.4 % (ref 36.0–46.0)
Hemoglobin: 12.1 g/dL (ref 12.0–15.0)
Immature Granulocytes: 0 %
Lymphocytes Relative: 16 %
Lymphs Abs: 1.2 K/uL (ref 0.7–4.0)
MCH: 23.1 pg — ABNORMAL LOW (ref 26.0–34.0)
MCHC: 31.5 g/dL (ref 30.0–36.0)
MCV: 73.4 fL — ABNORMAL LOW (ref 80.0–100.0)
Monocytes Absolute: 0.8 K/uL (ref 0.1–1.0)
Monocytes Relative: 10 %
Neutro Abs: 5.3 K/uL (ref 1.7–7.7)
Neutrophils Relative %: 72 %
Platelets: 187 K/uL (ref 150–400)
RBC: 5.23 MIL/uL — ABNORMAL HIGH (ref 3.87–5.11)
RDW: 19.5 % — ABNORMAL HIGH (ref 11.5–15.5)
WBC: 7.4 K/uL (ref 4.0–10.5)
nRBC: 0 % (ref 0.0–0.2)

## 2024-12-09 LAB — PROTIME-INR
INR: 3.6 — ABNORMAL HIGH (ref 0.8–1.2)
Prothrombin Time: 37.8 s — ABNORMAL HIGH (ref 11.4–15.2)

## 2024-12-09 LAB — BASIC METABOLIC PANEL WITH GFR
Anion gap: 13 (ref 5–15)
BUN: 30 mg/dL — ABNORMAL HIGH (ref 8–23)
CO2: 23 mmol/L (ref 22–32)
Calcium: 8.5 mg/dL — ABNORMAL LOW (ref 8.9–10.3)
Chloride: 105 mmol/L (ref 98–111)
Creatinine, Ser: 1.44 mg/dL — ABNORMAL HIGH (ref 0.44–1.00)
GFR, Estimated: 39 mL/min — ABNORMAL LOW
Glucose, Bld: 164 mg/dL — ABNORMAL HIGH (ref 70–99)
Potassium: 4.2 mmol/L (ref 3.5–5.1)
Sodium: 140 mmol/L (ref 135–145)

## 2024-12-09 LAB — GLUCOSE, CAPILLARY
Glucose-Capillary: 130 mg/dL — ABNORMAL HIGH (ref 70–99)
Glucose-Capillary: 182 mg/dL — ABNORMAL HIGH (ref 70–99)
Glucose-Capillary: 276 mg/dL — ABNORMAL HIGH (ref 70–99)
Glucose-Capillary: 176 mg/dL — ABNORMAL HIGH (ref 70–99)

## 2024-12-09 LAB — MAGNESIUM: Magnesium: 2.3 mg/dL (ref 1.7–2.4)

## 2024-12-09 MED ORDER — DAPAGLIFLOZIN PROPANEDIOL 10 MG PO TABS
10.0000 mg | ORAL_TABLET | Freq: Every day | ORAL | Status: DC
  Administered 2024-12-09 – 2024-12-11 (×3): 10 mg via ORAL
  Filled 2024-12-09 (×3): qty 1

## 2024-12-09 MED ORDER — ALBUTEROL SULFATE (2.5 MG/3ML) 0.083% IN NEBU
2.5000 mg | INHALATION_SOLUTION | Freq: Two times a day (BID) | RESPIRATORY_TRACT | Status: DC
  Administered 2024-12-09 – 2024-12-11 (×4): 2.5 mg via RESPIRATORY_TRACT
  Filled 2024-12-09 (×4): qty 3

## 2024-12-09 NOTE — Progress Notes (Signed)
 PHARMACY - ANTICOAGULATION CONSULT NOTE  Pharmacy Consult for warfarin Indication: atrial fibrillation  Allergies[1]  Patient Measurements: Height: 5' 2 (157.5 cm) Weight: 75.4 kg (166 lb 3.2 oz) IBW/kg (Calculated) : 50.1 HEPARIN  DW (KG): 68.2  Vital Signs: Temp: 98.5 F (36.9 C) (01/06 0537) Temp Source: Oral (01/06 0537) BP: 109/73 (01/06 0537) Pulse Rate: 83 (01/06 0537)  Labs: Recent Labs    12/07/24 0434 12/08/24 0404 12/09/24 0416  HGB 12.3 12.4 12.1  HCT 39.9 40.3 38.4  PLT 191 193 187  LABPROT 27.3* 30.6* 37.8*  INR 2.4* 2.8* 3.6*  CREATININE 1.47* 1.42* 1.44*    Estimated Creatinine Clearance: 35 mL/min (A) (by C-G formula based on SCr of 1.44 mg/dL (H)).   Medical History: Past Medical History:  Diagnosis Date   Arrhythmia    Hx PAF, PVC's   CHF (congestive heart failure) (HCC)    Coronary artery disease    04/04/2017 STEMI   Diabetes [facility])    H/O mitral valve replacement 09/21/2021   Heart attack (HCC)    per pt reprot   Hyperlipidemia    Hypertension    New onset atrial fibrillation (HCC) 05/2017    Medications:  Warfarin 5 mg daily PTA Last dose 12/30 Dose consistent with last visit to anticoag clinic 12/11  Assessment: 69 year old patient with history of atrial fibrillation on warfarin PTA presents with shortness of breath, positive for influenza with concern for CAP. Blood cultures returned positive for strep pneumoniae.  12/09/2024 INR 3.6 - supratherapeutic CBC stable No complications of therapy noted   Goal of Therapy:  INR 2-3 Monitor platelets by anticoagulation protocol: Yes   Plan:  -HOLD warfarin today -Daily INR for now  Rachael Davis, PharmD, BCPS Clinical Pharmacist 12/09/2024 7:39 AM         [1]  Allergies Allergen Reactions   Keflex  [Cephalexin ] Rash    Skin on feet peeled.   Asa [Aspirin ]     325 mg ----heartburn Pt can take 81 mg   Enalapril Cough   Lisinopril  Cough

## 2024-12-09 NOTE — Progress Notes (Addendum)
 " PROGRESS NOTE    Rachael Davis  FMW:[id number] DOB: July 18, 1956 DOA: 12/05/2024 PCP: Sun, Vyvyan, MD    Chief Complaint  Patient presents with   Cough   Shortness of Breath    Brief Narrative:  Rachael Davis is a 69 y.o. female with medical history significant for atrial fibrillation on Coumadin , chronic systolic congestive heart failure with EF 20%, diabetes, hypertension being admitted to the hospital with evidence of acute heart failure in the setting of influenza A infection, pneumonia, Streptococcus bacteremia.  Cardiology, ID consulted.  TEE concerning for aortic valve endocarditis.   Assessment & Plan:   Principal Problem:   Acute CHF (congestive heart failure) (HCC) Active Problems:   Endocarditis   Bacteremia due to Streptococcus pneumoniae   Paroxysmal atrial fibrillation (HCC)   Acute systolic (congestive) heart failure (HCC)   Community acquired pneumonia   Influenza A with pneumonia   Severe sepsis (HCC)   S/P mitral valve replacement with bioprosthetic valve   Type 2 diabetes mellitus with stage 3a chronic kidney disease, without long-term current use of insulin  (HCC)   Streptococcal bacteremia  #1 acute on chronic systolic heart failure -Patient presented with lower extremity edema, orthopnea felt likely secondary to influenza A, medication noncompliance, elevated proBNP of 29,984. - Patient with known EF <20% with left ventricular global hypokinesis (2D echo 09/17/2024). - Patient was initially on Lasix  60 mg IV every 12 hours and dose increased to Lasix  80 mg IV every 12 hours.  - Patient with urine output of 2 L over the past 24 hours. - Patient still volume overloaded on examination. - Patient followed by cardiology. -2D echo done with a EF of <20%, left ventricular global hypokinesis, severely reduced right ventricular systolic function, normal pulmonary artery systolic pressure, mildly dilated left atrial size, mitral valve replacement bioprosthetic  valve.  Left ventricular side of mitral valve with an independently mobile echodensity measuring 0.31 cm x 0.97 cm concerning for possible infective endocarditis.  -TEE with a EF of 20 to 25%, moderately reduced right ventricular systolic function, mitral valve bioprosthesis without evidence of vegetation.  Small mobile linear echodensity on ventricular side of aortic valve in setting of bacteremia this can be seen in endocarditis.  Mildly dilated pulmonary artery.  Agitated saline contrast bubble study positive with shunting observed after > 6 cardiac cycles suggestive of intrapulmonary shunting. - Continue carvedilol  12.5 mg twice daily, low-dose Entresto . - Imdur  discontinued by cardiology due to soft blood pressure and to allow diuresis. - Cardiology recommended to hold Farxiga  in the setting of bacteremia. - Cardiology following appreciate the input and recommendations.  2.  Severe sepsis secondary to influenza A, probable pneumonia, Streptococcus bacteremia/endocarditis. - Patient met criteria for sepsis on admission with leukocytosis, tachycardia, endorgan dysfunction with a lactic of 2.5. - Source of sepsis felt to be multifactorial secondary to influenza A, suspected community-acquired pneumonia, Streptococcus bacteremia. - Patient not received fluid boluses due acute CHF exacerbation with a baseline EF <20%. -2D echo done with a EF of <20%, left ventricular global hypokinesis, severely reduced right ventricular systolic function, normal pulmonary artery systolic pressure, mildly dilated left atrial size, mitral valve replacement bioprosthetic valve.  Left ventricular side of mitral valve with an independently mobile echodensity measuring 0.31 cm x 0.97 cm concerning for possible infective endocarditis.  -TEE with a EF of 20 to 25%, moderately reduced right ventricular systolic function, mitral valve bioprosthesis without evidence of vegetation.  Small mobile linear echodensity on ventricular  side of aortic  valve in setting of bacteremia this can be seen in endocarditis.  Mildly dilated pulmonary artery.  Agitated saline contrast bubble study positive with shunting observed after > 6 cardiac cycles suggestive of intrapulmonary shunting. - Continue IV Rocephin , Tamiflu .  - ID following.    3.  Influenza A -Patient noted to have presented with cough, dyspnea, orthopnea. - Patient with no hypoxia. - Continue Tamiflu  day 5/5.  - Continue supportive care.  4.  Community-acquired pneumonia  -Patient with leukocytosis, productive cough, chest x-ray concerning for pneumonia. - Blood cultures with Streptococcus bacteremia. - Azithromycin  discontinued. - Continue IV Rocephin .   5.  Streptococcus pneumoniae bacteremia/probable endocarditis - May have seeded from probable pneumonia. - Patient status post ICD concern for possible vegetations. - 2D echo with a EF of <20%, left ventricular global hypokinesis, severely reduced right ventricular systolic function, normal pulmonary artery systolic pressure, mildly dilated left atrial size, mitral valve replacement bioprosthetic valve.  Left ventricular side of mitral valve with an independently mobile echodensity measuring 0.31 cm x 0.97 cm concerning for possible infective endocarditis.  . - ID consulted and repeat blood cultures ordered and pending. - Continue IV Rocephin . -Cardiology recommending TEE which was done on 12/08/2024 which showed EF of 20 to 25%, moderately reduced right ventricular systolic function, bioprosthetic mitral valve without vegetation.  Small mobile linear echodensity on ventricular side of aortic valve concerning for endocarditis.   - Continue IV antibiotics.   - Per ID.   6.  CKD stage IIIa - Stable, currently at baseline  - Monitor renal function on Entresto  and diuresis.  7.  Hypertension - Continue carvedilol , Entresto , IV Lasix .  - Imdur  discontinued per cardiology due to soft blood pressure and to allow room  for diuresis.    8.  Diabetes mellitus type 2 with hyperglycemia -Hemoglobin A1c 6.6 (09/16/2024) - CBG 130 this morning. - Farxiga  discontinued due to acute infection with bacteremia/concern for endocarditis. - SSI.  9.  Paroxysmal A-fib/bioprosthetic mitral valve replacement - Carvedilol  for rate control.   - Coumadin  for anticoagulation.  - INR at 3.6 today.  - Hold Coumadin  today.  - Coumadin  per pharmacy.    10.  History of VT status post [employer] ICD -No VT noted on telemetry. - Patient being followed by cardiology, concern for seeding of device with bacteremia. - 2D echo with a EF of <20%, left ventricular global hypokinesis, severely reduced right ventricular systolic function, normal pulmonary artery systolic pressure, mildly dilated left atrial size, mitral valve replacement bioprosthetic valve.  Left ventricular side of mitral valve with an independently mobile echodensity measuring 0.31 cm x 0.97 cm concerning for possible infective endocarditis.   - Patient being followed by cardiology who recommended TEE which was done on 12/08/2024 which showed no evidence of vegetation on bioprosthetic mitral valve however did show a small mobile linear echodensity on ventricular side of the aortic valve concerning for possible endocarditis.   -Cardiology to review with the EP whether ICD needs to be removed. - Per cardiology.   DVT prophylaxis: Coumadin /supratherapeutic INR Code Status: Full Family Communication: Updated patient.  No family at bedside. Disposition: Home when clinically improved and cleared by ID and cardiology.  Status is: Inpatient Remains inpatient appropriate because: Severity of disease.   Consultants:  Cardiology: Dr. Jeffrie 12/05/2024 ID: Dr. Luiz 12/06/2024  Procedures:  Chest x-ray 12/05/2024 2D echo 12/07/2024 TEE 12/08/2024--bioprosthetic mitral valve without evidence of vegetation.  Small mobile linear echodensity on ventricular side of aortic valve  concerning for  endocarditis.  Antimicrobials:  Anti-infectives (From admission, onward)    Start     Dose/Rate Route Frequency Ordered Stop   12/06/24 0800  azithromycin  (ZITHROMAX ) 500 mg in sodium chloride  0.9 % 250 mL IVPB  Status:  Discontinued        500 mg 250 mL/hr over 60 Minutes Intravenous Every 24 hours 12/05/24 0754 12/06/24 0604   12/06/24 0800  cefTRIAXone  (ROCEPHIN ) 1 g in sodium chloride  0.9 % 100 mL IVPB  Status:  Discontinued        1 g 200 mL/hr over 30 Minutes Intravenous Every 24 hours 12/05/24 0754 12/05/24 2323   12/05/24 2324  cefTRIAXone  (ROCEPHIN ) 2 g in sodium chloride  0.9 % 100 mL IVPB        2 g 200 mL/hr over 30 Minutes Intravenous Daily at bedtime 12/05/24 2323     12/05/24 1800  oseltamivir  (TAMIFLU ) capsule 30 mg        30 mg Oral 2 times daily 12/05/24 0747 12/10/24 0759   12/05/24 0600  oseltamivir  (TAMIFLU ) capsule 75 mg  Status:  Discontinued        75 mg Oral  Once 12/05/24 0553 12/05/24 0554   12/05/24 0600  oseltamivir  (TAMIFLU ) capsule 30 mg        30 mg Oral  Once 12/05/24 0554 12/05/24 0624   12/05/24 0430  cefTRIAXone  (ROCEPHIN ) 1 g in sodium chloride  0.9 % 100 mL IVPB        1 g 200 mL/hr over 30 Minutes Intravenous  Once 12/05/24 0424 12/05/24 0503   12/05/24 0430  azithromycin  (ZITHROMAX ) 500 mg in sodium chloride  0.9 % 250 mL IVPB        500 mg 250 mL/hr over 60 Minutes Intravenous  Once 12/05/24 0424 12/05/24 0606         Subjective: Patient sitting up in recliner.  Some improvement with shortness of breath.  Still with urine output however states not peeing as much as she has before in the past on IV Lasix .  Denies any chest pain.  Still with lower extremity edema.    Objective: Vitals:   12/09/24 0415 12/09/24 0537 12/09/24 0934 12/09/24 1342  BP:  109/73  102/66  Pulse:  83  83  Resp:  18  20  Temp:  98.5 F (36.9 C)  98.3 F (36.8 C)  TempSrc:  Oral  Oral  SpO2:  100% 96% 100%  Weight: 75.4 kg     Height:         Intake/Output Summary (Last 24 hours) at 12/09/2024 1846 Last data filed at 12/09/2024 1200 Gross per 24 hour  Intake 234.49 ml  Output 1650 ml  Net -1415.51 ml   Filed Weights   12/07/24 0500 12/08/24 0500 12/09/24 0415  Weight: 76.2 kg 83.7 kg 75.4 kg    Examination:  General exam: NAD. Respiratory system: Bibasilar crackles.  No wheezing.  No rhonchi.  Fair air movement.  Speaking in full sentences.  No use of accessory muscles of respiration.  Cardiovascular system: RRR no murmurs rubs or gallops.  2+ bilateral lower extremity edema.  Gastrointestinal system: Abdomen is soft, nontender, nondistended, positive bowel sounds.  No rebound.  No guarding.   Central nervous system: Alert and oriented. No focal neurological deficits. Extremities: 2+ bilateral lower extremity edema.  Skin: No rashes, lesions or ulcers Psychiatry: Judgement and insight appear normal. Mood & affect appropriate.     Data Reviewed: I have personally reviewed following labs and imaging studies  CBC: Recent Labs  Lab 12/05/24 0344 12/06/24 0426 12/07/24 0434 12/08/24 0404 12/09/24 0416  WBC 18.4* 15.9* 10.3 7.1 7.4  NEUTROABS  --   --  8.3* 5.3 5.3  HGB 13.5 11.9* 12.3 12.4 12.1  HCT 43.7 38.7 39.9 40.3 38.4  MCV 74.4* 73.7* 73.3* 73.1* 73.4*  PLT 173 160 191 193 187    Basic Metabolic Panel: Recent Labs  Lab 12/05/24 0344 12/06/24 0426 12/07/24 0434 12/08/24 0404 12/09/24 0416  NA 134* 138 139 140 140  K 5.1 4.5 4.1 3.6 4.2  CL 99 105 103 103 105  CO2 19* 22 24 25 23   GLUCOSE 342* 127* 113* 143* 164*  BUN 21 35* 36* 35* 30*  CREATININE 1.30* 1.43* 1.47* 1.42* 1.44*  CALCIUM  9.3 8.6* 8.7* 8.5* 8.5*  MG  --   --  2.5* 2.4 2.3    GFR: Estimated Creatinine Clearance: 35 mL/min (A) (by C-G formula based on SCr of 1.44 mg/dL (H)).  Liver Function Tests: No results for input(s): AST, ALT, ALKPHOS, BILITOT, PROT, ALBUMIN  in the last 168 hours.  CBG: Recent Labs  Lab  12/08/24 1606 12/08/24 2108 12/09/24 0744 12/09/24 1212 12/09/24 1608  GLUCAP 234* 128* 130* 182* 276*     Recent Results (from the past 240 hours)  Resp panel by RT-PCR (RSV, Flu A&B, Covid) Anterior Nasal Swab     Status: Abnormal   Collection Time: 12/05/24  3:44 AM   Specimen: Anterior Nasal Swab  Result Value Ref Range Status   SARS Coronavirus 2 by RT PCR NEGATIVE NEGATIVE Final    Comment: (NOTE) SARS-CoV-2 target nucleic acids are NOT DETECTED.  The SARS-CoV-2 RNA is generally detectable in upper respiratory specimens during the acute phase of infection. The lowest concentration of SARS-CoV-2 viral copies this assay can detect is 138 copies/mL. A negative result does not preclude SARS-Cov-2 infection and should not be used as the sole basis for treatment or other patient management decisions. A negative result may occur with  improper specimen collection/handling, submission of specimen other than nasopharyngeal swab, presence of viral mutation(s) within the areas targeted by this assay, and inadequate number of viral copies(<138 copies/mL). A negative result must be combined with clinical observations, patient history, and epidemiological information. The expected result is Negative.  Fact Sheet for Patients:  bloggercourse.com  Fact Sheet for Healthcare Providers:  seriousbroker.it  This test is no t yet approved or cleared by the [place]  FDA and  has been authorized for detection and/or diagnosis of SARS-CoV-2 by FDA under an Emergency Use Authorization (EUA). This EUA will remain  in effect (meaning this test can be used) for the duration of the COVID-19 declaration under Section 564(b)(1) of the Act, 21 U.S.C.section 360bbb-3(b)(1), unless the authorization is terminated  or revoked sooner.       Influenza A by PCR POSITIVE (A) NEGATIVE Final   Influenza B by PCR NEGATIVE NEGATIVE Final    Comment:  (NOTE) The Xpert Xpress SARS-CoV-2/FLU/RSV plus assay is intended as an aid in the diagnosis of influenza from Nasopharyngeal swab specimens and should not be used as a sole basis for treatment. Nasal washings and aspirates are unacceptable for Xpert Xpress SARS-CoV-2/FLU/RSV testing.  Fact Sheet for Patients: bloggercourse.com  Fact Sheet for Healthcare Providers: seriousbroker.it  This test is not yet approved or cleared by the [place]  FDA and has been authorized for detection and/or diagnosis of SARS-CoV-2 by FDA under an Emergency Use Authorization (EUA). This EUA will remain in  effect (meaning this test can be used) for the duration of the COVID-19 declaration under Section 564(b)(1) of the Act, 21 U.S.C. section 360bbb-3(b)(1), unless the authorization is terminated or revoked.     Resp Syncytial Virus by PCR NEGATIVE NEGATIVE Final    Comment: (NOTE) Fact Sheet for Patients: bloggercourse.com  Fact Sheet for Healthcare Providers: seriousbroker.it  This test is not yet approved or cleared by the [place]  FDA and has been authorized for detection and/or diagnosis of SARS-CoV-2 by FDA under an Emergency Use Authorization (EUA). This EUA will remain in effect (meaning this test can be used) for the duration of the COVID-19 declaration under Section 564(b)(1) of the Act, 21 U.S.C. section 360bbb-3(b)(1), unless the authorization is terminated or revoked.  Performed at [facility], [address], [place], [place] [address]   Blood culture (routine x 2)     Status: Abnormal   Collection Time: 12/05/24  4:05 AM   Specimen: BLOOD RIGHT WRIST  Result Value Ref Range Status   Specimen Description   Final    BLOOD RIGHT WRIST Performed at [facility], [address], [place], [place] [address]    Special Requests   Final    BOTTLES DRAWN  AEROBIC AND ANAEROBIC Blood Culture results may not be optimal due to an inadequate volume of blood received in culture bottles Performed at [facility], [address] [place], [place] [address]    Culture  Setup Time   Final    GRAM POSITIVE COCCI IN PAIRS IN BOTH AEROBIC AND ANAEROBIC BOTTLES CRITICAL RESULT CALLED TO, READ BACK BY AND VERIFIED WITH: PHARMD M. BELL [id number] @ 2322 FH Performed at [facility], [address] [place], [place] [address]    Culture STREPTOCOCCUS PNEUMONIAE (A)  Final   Report Status 12/07/2024 FINAL  Final   Organism ID, Bacteria STREPTOCOCCUS PNEUMONIAE  Final      Susceptibility   Streptococcus pneumoniae - MIC*    ERYTHROMYCIN <=0.12 SENSITIVE Sensitive     LEVOFLOXACIN  0.5 SENSITIVE Sensitive     VANCOMYCIN  0.25 SENSITIVE Sensitive     PENICILLIN (meningitis) 0.25 RESISTANT Resistant     PENO - penicillin 0.25      PENICILLIN (non-meningitis) 0.25 SENSITIVE Sensitive     PENICILLIN (oral) 0.25 INTERMEDIATE Intermediate     CEFTRIAXONE  (non-meningitis) 0.25 SENSITIVE Sensitive     CEFTRIAXONE  (meningitis) 0.25 SENSITIVE Sensitive     * STREPTOCOCCUS PNEUMONIAE  Blood Culture ID Panel (Reflexed)     Status: Abnormal   Collection Time: 12/05/24  4:05 AM  Result Value Ref Range Status   Enterococcus faecalis NOT DETECTED NOT DETECTED Final   Enterococcus Faecium NOT DETECTED NOT DETECTED Final   Listeria monocytogenes NOT DETECTED NOT DETECTED Final   Staphylococcus species NOT DETECTED NOT DETECTED Final   Staphylococcus aureus (BCID) NOT DETECTED NOT DETECTED Final   Staphylococcus epidermidis NOT DETECTED NOT DETECTED Final   Staphylococcus lugdunensis NOT DETECTED NOT DETECTED Final   Streptococcus species DETECTED (A) NOT DETECTED Final    Comment: CRITICAL RESULT CALLED TO, READ BACK BY AND VERIFIED WITH: PHARMD M. BELL [id number] @ 2322 FH    Streptococcus agalactiae NOT DETECTED NOT DETECTED Final   Streptococcus  pneumoniae DETECTED (A) NOT DETECTED Final    Comment: CRITICAL RESULT CALLED TO, READ BACK BY AND VERIFIED WITH: PHARMD M. BELL [id number] @ 2322 FH    Streptococcus pyogenes NOT DETECTED NOT DETECTED Final   A.calcoaceticus-baumannii NOT  DETECTED NOT DETECTED Final   Bacteroides fragilis NOT DETECTED NOT DETECTED Final   Enterobacterales NOT DETECTED NOT DETECTED Final   Enterobacter cloacae complex NOT DETECTED NOT DETECTED Final   Escherichia coli NOT DETECTED NOT DETECTED Final   Klebsiella aerogenes NOT DETECTED NOT DETECTED Final   Klebsiella oxytoca NOT DETECTED NOT DETECTED Final   Klebsiella pneumoniae NOT DETECTED NOT DETECTED Final   Proteus species NOT DETECTED NOT DETECTED Final   Salmonella species NOT DETECTED NOT DETECTED Final   Serratia marcescens NOT DETECTED NOT DETECTED Final   Haemophilus influenzae NOT DETECTED NOT DETECTED Final   Neisseria meningitidis NOT DETECTED NOT DETECTED Final   Pseudomonas aeruginosa NOT DETECTED NOT DETECTED Final   Stenotrophomonas maltophilia NOT DETECTED NOT DETECTED Final   Candida albicans NOT DETECTED NOT DETECTED Final   Candida auris NOT DETECTED NOT DETECTED Final   Candida glabrata NOT DETECTED NOT DETECTED Final   Candida krusei NOT DETECTED NOT DETECTED Final   Candida parapsilosis NOT DETECTED NOT DETECTED Final   Candida tropicalis NOT DETECTED NOT DETECTED Final   Cryptococcus neoformans/gattii NOT DETECTED NOT DETECTED Final    Comment: Performed at [facility], [address] [place], [place] [address]  Blood culture (routine x 2)     Status: None (Preliminary result)   Collection Time: 12/05/24  3:13 PM   Specimen: BLOOD RIGHT ARM  Result Value Ref Range Status   Specimen Description   Final    BLOOD RIGHT ARM Performed at [facility], [address] [place], [place] [address]    Special Requests   Final    BOTTLES DRAWN AEROBIC ONLY Blood Culture results may not be optimal due to an inadequate  volume of blood received in culture bottles Performed at [facility], [address], [place], [place] [address]    Culture   Final    NO GROWTH 4 DAYS Performed at [facility], [address], [place], [place] [address]    Report Status PENDING  Incomplete  Culture, blood (Routine X 2) w Reflex to ID Panel     Status: None (Preliminary result)   Collection Time: 12/06/24  6:57 PM   Specimen: BLOOD RIGHT ARM  Result Value Ref Range Status   Specimen Description BLOOD RIGHT ARM  Final   Special Requests AEROBIC BOTTLE ONLY Blood Culture adequate volume  Final   Culture   Final    NO GROWTH 2 DAYS Performed at [facility], [address] [place], [place] [address]    Report Status PENDING  Incomplete  Culture, blood (Routine X 2) w Reflex to ID Panel     Status: None (Preliminary result)   Collection Time: 12/06/24  6:57 PM   Specimen: BLOOD RIGHT ARM  Result Value Ref Range Status   Specimen Description BLOOD RIGHT ARM  Final   Special Requests AEROBIC BOTTLE ONLY Blood Culture adequate volume  Final   Culture   Final    NO GROWTH 2 DAYS Performed at [facility], [address] [place], [place] [address]    Report Status PENDING  Incomplete         Radiology Studies: ECHO TEE Result Date: 12/08/2024    TRANSESOPHOGEAL ECHO REPORT   Patient Name:   Rachael Davis Date of Exam: 12/08/2024 Medical Rec #:  [id number]       Height:       62.0 in Accession #:  [id number]      Weight:       184.5 lb Date of Birth:  1955/12/19       BSA:          1.847 m Patient Age:    38 years        BP:           124/77 mmHg Patient Gender: F               HR:           76 bpm. Exam Location:  Inpatient Procedure: Transesophageal Echo, 3D Echo, Color Doppler, Cardiac Doppler and            Saline Contrast Bubble Study (Both Spectral and Color Flow Doppler            were utilized during procedure). Indications:     Endocarditis  History:         Patient has  prior history of Echocardiogram examinations, most                  recent 12/07/2024. CHF; Arrythmias:Atrial Fibrillation.  Sonographer:     Philomena Daring Referring Phys:  [id number] MAHESH A SANTO Diagnosing Phys: Stanly Santo MD PROCEDURE: After discussion of the risks and benefits of a TEE, an informed consent was obtained from the patient. The transesophogeal probe was passed without difficulty through the esophogus of the patient. Imaged were obtained with the patient in a left lateral decubitus position. Sedation performed by different physician. The patient was monitored while under deep sedation. Anesthestetic sedation was provided intravenously by Anesthesiology: 50mg  of Propofol , 100mg  of Lidocaine . The patient developed no complications during the procedure.  IMPRESSIONS  1. Left ventricular ejection fraction, by estimation, is 20 to 25%. The left ventricle has severely decreased function.  2. Right ventricular systolic function is moderately reduced. The right ventricular size is not well visualized.  3. Heavy left atrial smoke. No left atrial/left atrial appendage thrombus was detected. The LAA emptying velocity was 18 cm/s.  4. 25mm Magna Ease Mitral Pericardial Bioprosthesis without evidence of vegetation. The mitral valve has been repaired/replaced. No evidence of mitral valve regurgitation. No evidence of mitral stenosis.  5. Small (<1 cm) mobile linear echodensity send predominantly on the ventricular side of the aortic valve. In the setting of bacteremia, this can be seen in endocarditis. The aortic valve is tricuspid. Aortic valve regurgitation is not visualized. No aortic stenosis is present.  6. Mildly dilated pulmonary artery.  7. Agitated saline contrast bubble study was positive with shunting observed after >6 cardiac cycles suggestive of intrapulmonary shunting.  8. 3D performed of the mitral valve and demonstrates Sutures ntoed, but not IE seen on 3D MV. FINDINGS  Left  Ventricle: Left ventricular ejection fraction, by estimation, is 20 to 25%. The left ventricle has severely decreased function. The left ventricular internal cavity size was normal in size. Right Ventricle: The right ventricular size is not well visualized. Right vetricular wall thickness was not well visualized. Right ventricular systolic function is moderately reduced. Left Atrium: Heavy left atrial smoke. Left atrial size was normal in size. No left atrial/left atrial appendage thrombus was detected. The LAA emptying velocity was 18 cm/s. Right Atrium: Right atrial size was normal in size. Pericardium: There is no evidence of pericardial effusion. Mitral Valve: 25mm Magna Ease Mitral Pericardial Bioprosthesis without evidence of vegetation. The mitral valve has been repaired/replaced. No evidence of mitral valve regurgitation. There is a 25 mm Magna  Ease bioprosthetic valve present in the mitral position. No evidence of mitral valve stenosis. Tricuspid Valve: The tricuspid valve is normal in structure. Tricuspid valve regurgitation is mild . No evidence of tricuspid stenosis. Aortic Valve: Small (<1 cm) mobile linear echodensity send predominantly on the ventricular side of the aortic valve. In the setting of bacteremia, this can be seen in endocarditis. The aortic valve is tricuspid. Aortic valve regurgitation is not visualized. No aortic stenosis is present. Pulmonic Valve: The pulmonic valve was normal in structure. Pulmonic valve regurgitation is not visualized. No evidence of pulmonic stenosis. Aorta: The aortic root, ascending aorta, aortic arch and descending aorta are all structurally normal, with no evidence of dilitation or obstruction. Pulmonary Artery: The pulmonary artery is mildly dilated. IAS/Shunts: No atrial level shunt detected by color flow Doppler. Agitated saline contrast was given intravenously to evaluate for intracardiac shunting. Agitated saline contrast bubble study was positive with  shunting observed after >6 cardiac cycles suggestive of intrapulmonary shunting. Additional Comments: Spectral Doppler performed. Stanly Leavens MD Electronically signed by Stanly Leavens MD Signature Date/Time: 12/08/2024/4:02:55 PM    Final    EP STUDY Result Date: 12/08/2024 See surgical note for result.        Scheduled Meds:  albuterol   2.5 mg Nebulization BID   aspirin  EC  81 mg Oral Daily   atorvastatin   40 mg Oral Daily   carvedilol   12.5 mg Oral BID   dapagliflozin  propanediol  10 mg Oral Daily   fluticasone   2 spray Each Nare Daily   furosemide   80 mg Intravenous BID   insulin  aspart  0-15 Units Subcutaneous TID WC   insulin  aspart  0-5 Units Subcutaneous QHS   loratadine   10 mg Oral Daily   oseltamivir   30 mg Oral BID   pantoprazole   40 mg Oral Daily   potassium chloride   40 mEq Oral Daily   sacubitril -valsartan   1 tablet Oral BID   Warfarin - Pharmacist Dosing Inpatient   Does not apply q1600   Continuous Infusions:  cefTRIAXone  (ROCEPHIN )  IV Stopped (12/08/24 2153)     LOS: 4 days    Time spent: 40 minutes    Toribio Hummer, MD [facility]   To contact the attending provider between 7A-7P or the covering provider during after hours 7P-7A, please log into the web site [email or web address] and access using universal [place] password for that web site. If you do not have the password, please call the hospital operator.  12/09/2024, 6:46 PM    "

## 2024-12-09 NOTE — Progress Notes (Addendum)
 As below, patient seen and examined.  She denies dyspnea or chest pain.  Continues with lower extremity edema.  Transesophageal echocardiogram yesterday showed no vegetations on the prosthetic mitral valve or device but there was note of an oscillating density on the aortic valve without aortic insufficiency.  Possible vegetation.  Continue antibiotics per infectious disease.  Note 1 of 3 blood cultures positive.  Will review with electrophysiology whether ICD needs to be removed.  Continue present medications for congestive heart failure.  Redell Shallow    Progress Note  Patient Name: Rachael Davis Date of Encounter: 12/09/2024  [facility] Cardiologist: Annabella Scarce, MD   Interval Summary   Doing well this morning Discussed results of TEE with her, she understands  Still reports diuresis less than she normally responds  Shortness of breath has improved since admission   Vital Signs Vitals:   12/08/24 2114 12/09/24 0415 12/09/24 0537 12/09/24 0934  BP: 109/72  109/73   Pulse: 85  83   Resp:   18   Temp:   98.5 F (36.9 C)   TempSrc:   Oral   SpO2:   100% 96%  Weight:  75.4 kg    Height:        Intake/Output Summary (Last 24 hours) at 12/09/2024 1305 Last data filed at 12/09/2024 1200 Gross per 24 hour  Intake 234.49 ml  Output 1650 ml  Net -1415.51 ml      12/09/2024    4:15 AM 12/08/2024    5:00 AM 12/07/2024    5:00 AM  Last 3 Weights  Weight (lbs) 166 lb 3.2 oz 184 lb 8.4 oz 167 lb 15.9 oz  Weight (kg) 75.388 kg 83.7 kg 76.2 kg     Telemetry/ECG  Sinus, frequent PACs vs intermittent Afib  - Personally Reviewed  Physical Exam  GEN: No acute distress.   Neck: No JVD Cardiac: RRR, no murmurs, rubs, or gallops.  Respiratory: decreased at bases. GI: Soft, nontender, non-distended  MS: soft nonpitting LE edema  Assessment & Plan   Acute on chronic HFrEF Home meds: Carvedilol  12.5 mg BID, Farxiga  10 mg daily, Entresto  24-26 mg BID, Imdur  30 mg daily,  torsemide  40 mg daily with 20 mEq K daily  Echo 09/2024: LVEF <20%, global hypokinesis, normal RV systolic function, s/p MVR with normal structure/function of prosthesis, no other valvular abnormalities, normal IVC proBNP significantly elevated 29,984 Exacerbation in the setting of medication noncompliance, influenza A Renal function stable at 1.44 Net - 5.6 L this admission, with 2L out yesterday  Currently on IV Lasix  80 mg twice daily, reports still mild response, will discuss with MD regarding diuretic regimen  Continue carvedilol  12.5 mg twice daily Continue Entresto  24-26 mg twice daily Resume Farxiga  10 mg daily  Previously did not tolerate spironolactone  given hyperkalemia   CAD s/p CABG x 4 LIMA ? LAD, SVG ? RI, SVG ? OM, SVG ? RCA 04/2017 Home meds: Aspirin  81 mg daily, Lipitor  40 mg daily, Imdur  30 mg daily Denies any chest pain  EKG without changes compared to prior tracings  R/LHC 2023: Patent LIMA to LAD, SVG to OM1, occluded SVG to RCA, occluded SVG to ramus, normal LVEDP Patient denies any chest pain Continue aspirin  81 mg daily Continue Lipitor  40 mg daily Holding Imdur  for blood pressures while diuresing  Resume Farxiga  10 mg daily    Bioprosthetic MVR Echo 09/2024 showed no MR/MS, mean gradient 4 mmHg Continue to follow as an outpatient    History  of VT s/p ICD RBBB, SVT, NSVT First-degree AV block Follows closely with our EP team as an outpatient S/p [employer] single-chamber ICD ZIO monitor 01/2024: Sinus rhythm with first-degree AV block, occasional V pacing, NSVT x 8 episodes, SVT x 60 episodes, occasional PVCs, no atrial fibrillation detected   Paroxysmal atrial fibrillation, remote Noting s/p CABG in 2018 Home meds: Warfarin, carvedilol  12.5 mg BID ZIO monitor 01/2024: Sinus rhythm with first-degree AV block, occasional V pacing, NSVT x 8 episodes, SVT x 60 episodes, occasional PVCs, no atrial fibrillation detected Continue carvedilol  12.5 mg twice  daily Continue warfarin per pharmacy    Bacteria with concerns for endocarditis Echo this admission showed: LVEF <20%, global hypokinesis, severely reduced RV systolic function, concern for possible infective endocarditis of the mitral valve with independently mobile echodensity measuring 0.31 cm x 0.97 cm not visible on previous echo from 09/2024 TEE 12/08/2024: LVEF 20-25%, no LA/LAA thrombus, no vegetation noted on mitral valve, small aortic valve vegetation noted, normal device leads  Further treatment per primary      Per primary Strep pneumonia bacteremia Influenza A infection CKD stage IIIa Type II diabetes Community-acquired pneumonia Lactic acidosis  For questions or updates, please contact [facility] Please consult [email or web address] for contact info under       Signed, Waddell DELENA Donath, PA-C

## 2024-12-09 NOTE — Progress Notes (Signed)
" °   12/09/24 1338  TOC Brief Assessment  Insurance and Status Reviewed  Patient has primary care physician Yes  Home environment has been reviewed home with sposue  Prior level of function: independent  Prior/Current Home Services No current home services  Social Drivers of Health Review SDOH reviewed no interventions necessary  Readmission risk has been reviewed Yes  Transition of care needs no transition of care needs at this time    "

## 2024-12-10 ENCOUNTER — Encounter: Payer: Self-pay | Admitting: Student in an Organized Health Care Education/Training Program

## 2024-12-10 ENCOUNTER — Other Ambulatory Visit: Payer: Self-pay

## 2024-12-10 DIAGNOSIS — R7881 Bacteremia: Secondary | ICD-10-CM | POA: Diagnosis not present

## 2024-12-10 DIAGNOSIS — I509 Heart failure, unspecified: Secondary | ICD-10-CM | POA: Diagnosis not present

## 2024-12-10 DIAGNOSIS — I330 Acute and subacute infective endocarditis: Secondary | ICD-10-CM | POA: Diagnosis not present

## 2024-12-10 DIAGNOSIS — J1100 Influenza due to unidentified influenza virus with unspecified type of pneumonia: Secondary | ICD-10-CM | POA: Diagnosis not present

## 2024-12-10 DIAGNOSIS — I480 Paroxysmal atrial fibrillation: Secondary | ICD-10-CM | POA: Diagnosis not present

## 2024-12-10 DIAGNOSIS — I130 Hypertensive heart and chronic kidney disease with heart failure and stage 1 through stage 4 chronic kidney disease, or unspecified chronic kidney disease: Secondary | ICD-10-CM

## 2024-12-10 DIAGNOSIS — B953 Streptococcus pneumoniae as the cause of diseases classified elsewhere: Secondary | ICD-10-CM | POA: Diagnosis not present

## 2024-12-10 DIAGNOSIS — N189 Chronic kidney disease, unspecified: Secondary | ICD-10-CM | POA: Diagnosis not present

## 2024-12-10 LAB — CBC WITH DIFFERENTIAL/PLATELET
Abs Immature Granulocytes: 0.05 K/uL (ref 0.00–0.07)
Basophils Absolute: 0 K/uL (ref 0.0–0.1)
Basophils Relative: 1 %
Eosinophils Absolute: 0.1 K/uL (ref 0.0–0.5)
Eosinophils Relative: 1 %
HCT: 39.5 % (ref 36.0–46.0)
Hemoglobin: 11.9 g/dL — ABNORMAL LOW (ref 12.0–15.0)
Immature Granulocytes: 1 %
Lymphocytes Relative: 15 %
Lymphs Abs: 1.2 K/uL (ref 0.7–4.0)
MCH: 22.4 pg — ABNORMAL LOW (ref 26.0–34.0)
MCHC: 30.1 g/dL (ref 30.0–36.0)
MCV: 74.4 fL — ABNORMAL LOW (ref 80.0–100.0)
Monocytes Absolute: 0.6 K/uL (ref 0.1–1.0)
Monocytes Relative: 8 %
Neutro Abs: 5.7 K/uL (ref 1.7–7.7)
Neutrophils Relative %: 74 %
Platelets: 206 K/uL (ref 150–400)
RBC: 5.31 MIL/uL — ABNORMAL HIGH (ref 3.87–5.11)
RDW: 19.3 % — ABNORMAL HIGH (ref 11.5–15.5)
WBC: 7.7 K/uL (ref 4.0–10.5)
nRBC: 0 % (ref 0.0–0.2)

## 2024-12-10 LAB — BASIC METABOLIC PANEL WITH GFR
Anion gap: 11 (ref 5–15)
BUN: 29 mg/dL — ABNORMAL HIGH (ref 8–23)
CO2: 25 mmol/L (ref 22–32)
Calcium: 8.8 mg/dL — ABNORMAL LOW (ref 8.9–10.3)
Chloride: 103 mmol/L (ref 98–111)
Creatinine, Ser: 1.63 mg/dL — ABNORMAL HIGH (ref 0.44–1.00)
GFR, Estimated: 34 mL/min — ABNORMAL LOW
Glucose, Bld: 204 mg/dL — ABNORMAL HIGH (ref 70–99)
Potassium: 4.1 mmol/L (ref 3.5–5.1)
Sodium: 139 mmol/L (ref 135–145)

## 2024-12-10 LAB — PROTIME-INR
INR: 2.7 — ABNORMAL HIGH (ref 0.8–1.2)
Prothrombin Time: 29.7 s — ABNORMAL HIGH (ref 11.4–15.2)

## 2024-12-10 LAB — CULTURE, BLOOD (ROUTINE X 2): Culture: NO GROWTH

## 2024-12-10 LAB — GLUCOSE, CAPILLARY
Glucose-Capillary: 147 mg/dL — ABNORMAL HIGH (ref 70–99)
Glucose-Capillary: 173 mg/dL — ABNORMAL HIGH (ref 70–99)
Glucose-Capillary: 205 mg/dL — ABNORMAL HIGH (ref 70–99)
Glucose-Capillary: 168 mg/dL — ABNORMAL HIGH (ref 70–99)

## 2024-12-10 MED ORDER — WARFARIN SODIUM 5 MG PO TABS
5.0000 mg | ORAL_TABLET | Freq: Once | ORAL | Status: AC
  Administered 2024-12-10: 5 mg via ORAL
  Filled 2024-12-10: qty 1

## 2024-12-10 MED ORDER — TORSEMIDE 20 MG PO TABS
40.0000 mg | ORAL_TABLET | Freq: Every day | ORAL | Status: DC
  Administered 2024-12-11: 40 mg via ORAL
  Filled 2024-12-10: qty 2

## 2024-12-10 MED ORDER — CEFTRIAXONE IV (FOR PTA / DISCHARGE USE ONLY): 2.0000 g | INTRAVENOUS | 0 refills | Status: AC

## 2024-12-10 NOTE — Progress Notes (Signed)
 PHARMACY - ANTICOAGULATION CONSULT NOTE  Pharmacy Consult for warfarin Indication: atrial fibrillation  Allergies[1]  Patient Measurements: Height: 5' 2 (157.5 cm) Weight: 76.1 kg (167 lb 12.3 oz) IBW/kg (Calculated) : 50.1 HEPARIN  DW (KG): 68.2  Vital Signs: Temp: 98.3 F (36.8 C) (01/07 0456) Temp Source: Oral (01/07 0456) BP: 106/67 (01/07 0456) Pulse Rate: 80 (01/07 0456)  Labs: Recent Labs    12/08/24 0404 12/09/24 0416 12/10/24 0413  HGB 12.4 12.1 11.9*  HCT 40.3 38.4 39.5  PLT 193 187 206  LABPROT 30.6* 37.8* 29.7*  INR 2.8* 3.6* 2.7*  CREATININE 1.42* 1.44* 1.63*    Estimated Creatinine Clearance: 31.1 mL/min (A) (by C-G formula based on SCr of 1.63 mg/dL (H)).   Medical History: Past Medical History:  Diagnosis Date   Arrhythmia    Hx PAF, PVC's   CHF (congestive heart failure) (HCC)    Coronary artery disease    04/04/2017 STEMI   Diabetes [facility])    H/O mitral valve replacement 09/21/2021   Heart attack (HCC)    per pt reprot   Hyperlipidemia    Hypertension    New onset atrial fibrillation (HCC) 05/2017    Medications:  Warfarin 5 mg daily PTA Last dose 12/30 Dose consistent with last visit to anticoag clinic 12/11  Assessment: 69 year old patient with history of atrial fibrillation on warfarin PTA presents with shortness of breath, positive for influenza with concern for CAP. Blood cultures returned positive for strep pneumoniae.  12/10/2024 INR 2.7 - therapeutic Suspect elevated INR yesterday related to decreased oral intake on 1/5 for procedures CBC stable No complications of therapy noted   Goal of Therapy:  INR 2-3 Monitor platelets by anticoagulation protocol: Yes   Plan:  -Continue warfarin 5 mg -Daily INR for now  Stefano MARLA Bologna, PharmD, BCPS Clinical Pharmacist 12/10/2024 7:21 AM          [1]  Allergies Allergen Reactions   Keflex  [Cephalexin ] Rash    Skin on feet peeled.   Asa [Aspirin ]     325 mg  ----heartburn Pt can take 81 mg   Enalapril Cough   Lisinopril  Cough

## 2024-12-10 NOTE — Plan of Care (Signed)

## 2024-12-10 NOTE — Progress Notes (Signed)
 Rachael Davis is a 45F with AF, HFrEF, BP MV repair, CAD s/p 4v CABG (2018), sustained VT s/p BSCI ICD DM2, CKD3a and HTN who presented with ADHF in the setting of influenza A infection.  She was septic on admission with leukocytosis and lactic acidosis.  She was found to have S pneum bacteremia. She underwent TTE on 12/07/24 which showed severely reduced LVEF (< 20%) and severe RV dysfxn. There was concern for possible IE on the MV. She then underwent TEE which did not identify any mobile mass on the MV but was likely the sutures. The AoV however did have a small vegetation. She is being tx with medical tx for this. No embolic phenomenon and Bcx have cleared.  I reviewed the images with Dr. Santo and there was no concern for vegetation on the RV ICD lead. No plans for device extraction at this time.

## 2024-12-10 NOTE — Progress Notes (Addendum)
 " As below, patient seen and examined.  She denies dyspnea or chest pain.  She has minimal peripheral edema.  We have reviewed the case with Dr. Almetta.  He feels as though her device does not need to be extracted at this point.  Plan will be 6 weeks of antibiotics.  Will then need repeat blood cultures after antibiotics discontinued to make sure the bacteremia has been cleared.  Will also need follow-up echocardiogram in 6 to 12 weeks to reassess aortic valve vegetation.  Note she did not have aortic insufficiency.  Also note she did not have vegetations on her prosthetic mitral valve.  She appears to be euvolemic on examination.  Will continue present CHF medications.  She can be discharged from a cardiac standpoint once home antibiotics are arranged.  Please arrange follow-up in CHF clinic in 1 to 2 weeks.  Redell Shallow, MD  Progress Note  Patient Name: Rachael Davis Date of Encounter: 12/10/2024 [facility] Cardiologist: Annabella Scarce, MD   Interval Summary   Evaluated by EP -- no need for ICD extraction at this time  Reports doing well Eager to go home  May need assistance with IV antibiotics at discharge, unsure if her/her family would be able to handle it    Vital Signs Vitals:   12/09/24 2105 12/09/24 2218 12/10/24 0456 12/10/24 0500  BP:   106/67   Pulse:   80   Resp: (!) 22  16   Temp:   98.3 F (36.8 C)   TempSrc:   Oral   SpO2:  98% 97%   Weight:    76.1 kg  Height:        Intake/Output Summary (Last 24 hours) at 12/10/2024 1012 Last data filed at 12/10/2024 0830 Gross per 24 hour  Intake 590.5 ml  Output 1700 ml  Net -1109.5 ml      12/10/2024    5:00 AM 12/09/2024    4:15 AM 12/08/2024    5:00 AM  Last 3 Weights  Weight (lbs) 167 lb 12.3 oz 166 lb 3.2 oz 184 lb 8.4 oz  Weight (kg) 76.1 kg 75.388 kg 83.7 kg     Telemetry/ECG  Sinus, frequent PACs vs intermittent Afib  - Personally Reviewed  Physical Exam  GEN: No acute distress.   Neck: No  JVD Cardiac: RRR, no murmurs, rubs, or gallops.  Respiratory: Clear to auscultation bilaterally. GI: Soft, nontender, non-distended  MS: mild puffy LE edema  Assessment & Plan   Acute on chronic HFrEF Home meds: Carvedilol  12.5 mg BID, Farxiga  10 mg daily, Entresto  24-26 mg BID, Imdur  30 mg daily, torsemide  40 mg daily with 20 mEq K daily  Echo 09/2024: LVEF <20%, global hypokinesis, normal RV systolic function, s/p MVR with normal structure/function of prosthesis, no other valvular abnormalities, normal IVC proBNP significantly elevated 29,984 Exacerbation in the setting of medication noncompliance, influenza A Creatinine trending up 1.44 ? 1.63 Net - 6.2 L this admission, with 1.4L out yesterday  Previously on IV Lasix  80 mg daily, no longer appearing volume overloaded Transition back to PTA home torsemide  40 mg daily Continue carvedilol  12.5 mg BID Continue Entresto  24-26 mg BID Continue Farxiga  10 mg daily  Previously did not tolerate spironolactone  given hyperkalemia  Arranging outpatient follow up with our AHF clinic, where she typically follows    CAD s/p CABG x 4 LIMA ? LAD, SVG ? RI, SVG ? OM, SVG ? RCA 04/2017 Home meds: Aspirin  81 mg daily, Lipitor  40  mg daily, Imdur  30 mg daily Denies any chest pain  EKG without changes compared to prior tracings  R/LHC 2023: Patent LIMA to LAD, SVG to OM1, occluded SVG to RCA, occluded SVG to ramus, normal LVEDP Patient denies any chest pain Continue aspirin  81 mg daily Continue Lipitor  40 mg daily Holding Imdur  for blood pressures while diuresing  Continue Farxiga  10 mg daily    Bioprosthetic MVR Echo 09/2024 showed no MR/MS, mean gradient 4 mmHg Continue to follow as an outpatient    History of VT s/p ICD RBBB, SVT, NSVT First-degree AV block Follows closely with our EP team as an outpatient S/p [employer] single-chamber ICD ZIO monitor 01/2024: Sinus rhythm with first-degree AV block, occasional V pacing, NSVT x 8  episodes, SVT x 60 episodes, occasional PVCs, no atrial fibrillation detected Evaluated by EP, no need to explant ICD at this time   Paroxysmal atrial fibrillation, remote Noting s/p CABG in 2018 Home meds: Warfarin, carvedilol  12.5 mg BID ZIO monitor 01/2024: Sinus rhythm with first-degree AV block, occasional V pacing, NSVT x 8 episodes, SVT x 60 episodes, occasional PVCs, no atrial fibrillation detected Continue carvedilol  12.5 mg twice daily Continue warfarin per pharmacy    Bacteria with concerns for endocarditis Echo this admission showed: LVEF <20%, global hypokinesis, severely reduced RV systolic function, concern for possible infective endocarditis of the mitral valve with independently mobile echodensity measuring 0.31 cm x 0.97 cm not visible on previous echo from 09/2024 TEE 12/08/2024: LVEF 20-25%, no LA/LAA thrombus, no vegetation noted on mitral valve, small aortic valve vegetation noted, normal device leads  Further treatment per primary   Evaluated by EP, no need to explant ICD at this time    Per primary Strep pneumonia bacteremia Influenza A infection CKD stage IIIa Type II diabetes Community-acquired pneumonia Lactic acidosis  For questions or updates, please contact [facility] Please consult [email or web address] for contact info under   Signed, Waddell DELENA Donath, PA-C   "

## 2024-12-10 NOTE — Progress Notes (Signed)
 " PROGRESS NOTE Rachael Davis  FMW:[id number] DOB: 10-18-1956 DOA: 12/05/2024 PCP: Sun, Vyvyan, MD  Brief Narrative/Hospital Course: Rachael Davis is a 69 y.o. female with PMH of t for atrial fibrillation on Coumadin , chronic systolic congestive heart failure with EF 20%, diabetes, hypertension being admitted to the hospital with evidence of acute heart failure in the setting of influenza A infection, pneumonia, Streptococcus bacteremia.  Cardiology, ID consulted. TEE concerning for aortic valve endocarditis.    Subjective: Seen and examined today Resting comfortably in the bedside chair, no complaints nausea vomiting Overnight  on RA, afebrile, VSS, Labs creatinine 1.4> 1.6, CBC stable INR 2.7  Assessment and plan:  Acute on chronic systolic heart failure Previous EF less than 25% 10/25,Presented with lower extremity edema, orthopnea in the setting influenza A, med noncompliance elevated proBNP of 29k Cardiology following, EF less than 25% with global HK and AKI, concerning for endocarditis.  TEE showed EF 20-25%-mitral valve bioprosthesis without evidence of vegetation.  Small mobile linear echodensity on ventricular side of aortic valve in setting of bacteremia this can be seen in endocarditis.  Mildly dilated pulmonary artery.  Agitated saline contrast bubble study positive with shunting observed after > 6 cardiac cycles suggestive of intrapulmonary shunting. Managed with IV Lasix , changed to home torsemide  40 daily 12/11/24,GDMT-Coreg  12.5 mg,Farxiga , Entresto , k dur, Imdur  discontinued due to soft BP Cont to monitor daily I/O,weight, electrolytes and net balance as below.Keep on  salt/fluid restricted diet and monitor in tele. Net IO Since Admission: -6,869.9 mL [12/10/24 1315]  Filed Weights   12/08/24 0500 12/09/24 0415 12/10/24 0500  Weight: 83.7 kg 75.4 kg 76.1 kg    Recent Labs  Lab 12/05/24 0344 12/06/24 0426 12/07/24 0434 12/08/24 0404 12/09/24 0416 12/10/24 0413  PROBNP  29,984.0* 29,685.0*  --   --   --   --   BUN 21 35* 36* 35* 30* 29*  CREATININE 1.30* 1.43* 1.47* 1.42* 1.44* 1.63*  K 5.1 4.5 4.1 3.6 4.2 4.1  MG  --   --  2.5* 2.4 2.3  --     Severe sepsis secondary to influenza A, probable pneumonia Streptococcus bacteremia Improved endocarditis of aortic valve: At this time vitals stable.  On TTE there was concern for possible IE on the MV s/p TEE which did not identify any mobile mass on the MV but likely suture however  AV did have a small vegetation-normal and medically managed with IV antibiotics no embolic phenomena and blood cultures have cleared 1/3.  Per cardiology no plan for device extraction.ID following-continue ceftriaxone .  Completed Tamiflu  course x 5 day ?PICC timing- for IV antibiotic- may be will to do at home as she reports her daughter available to help, husband has Parkinson's and cannot help  CKD stage IIIa Creatinine fluctuating.  Monitor while on diuretics/GDMT  Hypertension: BP stable continue GDMT as above.Imdur  discontinued   T2Dm with hyperglycemia A1c 6.6 (09/16/2024) Blood sugar stable, continue Farxiga  , SSI  PAF Bioprosthetic mitral valve replacement: Continue to monitor on telemetry, continue Coreg , INR therapeutic Coumadin  being dosed by pharmacy Recent Labs  Lab 12/06/24 0426 12/07/24 0434 12/08/24 0404 12/09/24 0416 12/10/24 0413  INR 2.7* 2.4* 2.8* 3.6* 2.7*    History of VT status post [employer] ICD: No VT noted on telemetry. Cardiology following echo finding as above no plan for device extraction.   Class I Obesity w/ Body mass index is 30.69 kg/m.: Will benefit with PCP follow-up, weight loss,healthy lifestyle  DVT prophylaxis: Therapeutic INR  Code Status:   Code Status: Full Code Family Communication: plan of care discussed with patient/ at bedside. Patient status is: Remains hospitalized because of severity of illness Level of care: Telemetry   Dispo: The patient is from: home w.  Husband            Anticipated disposition: Anticipate discharge tomorrow if stable  Objective: Vitals last 24 hrs: Vitals:   12/09/24 2105 12/09/24 2218 12/10/24 0456 12/10/24 0500  BP:   106/67   Pulse:   80   Resp: (!) 22  16   Temp:   98.3 F (36.8 C)   TempSrc:   Oral   SpO2:  98% 97%   Weight:    76.1 kg  Height:        Physical Examination: General exam: alert awake, oriented, older than stated age HEENT:Oral mucosa moist, Ear/Nose WNL grossly Respiratory system: Bilaterally clear BS,no use of accessory muscle Cardiovascular system: S1 & S2 +, No JVD. Gastrointestinal system: Abdomen soft,NT,ND, BS+ Nervous System: Alert, awake, moving all extremities,and following commands. Extremities: extremities warm, leg edema neg Skin: Warm, no rashes MSK: Normal muscle bulk,tone, power   Medications reviewed:  Scheduled Meds:  albuterol   2.5 mg Nebulization BID   aspirin  EC  81 mg Oral Daily   atorvastatin   40 mg Oral Daily   carvedilol   12.5 mg Oral BID   dapagliflozin  propanediol  10 mg Oral Daily   fluticasone   2 spray Each Nare Daily   insulin  aspart  0-15 Units Subcutaneous TID WC   insulin  aspart  0-5 Units Subcutaneous QHS   loratadine   10 mg Oral Daily   pantoprazole   40 mg Oral Daily   potassium chloride   40 mEq Oral Daily   sacubitril -valsartan   1 tablet Oral BID   [START ON 12/11/2024] torsemide   40 mg Oral Daily   warfarin  5 mg Oral ONCE-1600   Warfarin - Pharmacist Dosing Inpatient   Does not apply q1600   Continuous Infusions:  cefTRIAXone  (ROCEPHIN )  IV 2 g (12/09/24 2108)   Diet: Diet Order             Diet 2 gram sodium Room service appropriate? Yes; Fluid consistency: Thin  Diet effective now                   Data Reviewed: I have personally reviewed following labs and imaging studies ( see epic result tab) CBC: Recent Labs  Lab 12/06/24 0426 12/07/24 0434 12/08/24 0404 12/09/24 0416 12/10/24 0413  WBC 15.9* 10.3 7.1 7.4 7.7   NEUTROABS  --  8.3* 5.3 5.3 5.7  HGB 11.9* 12.3 12.4 12.1 11.9*  HCT 38.7 39.9 40.3 38.4 39.5  MCV 73.7* 73.3* 73.1* 73.4* 74.4*  PLT 160 191 193 187 206   CMP: Recent Labs  Lab 12/06/24 0426 12/07/24 0434 12/08/24 0404 12/09/24 0416 12/10/24 0413  NA 138 139 140 140 139  K 4.5 4.1 3.6 4.2 4.1  CL 105 103 103 105 103  CO2 22 24 25 23 25   GLUCOSE 127* 113* 143* 164* 204*  BUN 35* 36* 35* 30* 29*  CREATININE 1.43* 1.47* 1.42* 1.44* 1.63*  CALCIUM  8.6* 8.7* 8.5* 8.5* 8.8*  MG  --  2.5* 2.4 2.3  --    GFR: Estimated Creatinine Clearance: 31.1 mL/min (A) (by C-G formula based on SCr of 1.63 mg/dL (H)). No results for input(s): AST, ALT, ALKPHOS, BILITOT, PROT, ALBUMIN  in the last 168 hours. No results for input(s): LIPASE,  AMYLASE in the last 168 hours. No results for input(s): AMMONIA in the last 168 hours. Coagulation Profile:  Recent Labs  Lab 12/06/24 0426 12/07/24 0434 12/08/24 0404 12/09/24 0416 12/10/24 0413  INR 2.7* 2.4* 2.8* 3.6* 2.7*   Unresulted Labs (From admission, onward)     Start     Ordered   12/06/24 0500  Protime-INR  Daily,   R      12/05/24 1356           Antimicrobials/Microbiology: Anti-infectives (From admission, onward)    Start     Dose/Rate Route Frequency Ordered Stop   12/06/24 0800  azithromycin  (ZITHROMAX ) 500 mg in sodium chloride  0.9 % 250 mL IVPB  Status:  Discontinued        500 mg 250 mL/hr over 60 Minutes Intravenous Every 24 hours 12/05/24 0754 12/06/24 0604   12/06/24 0800  cefTRIAXone  (ROCEPHIN ) 1 g in sodium chloride  0.9 % 100 mL IVPB  Status:  Discontinued        1 g 200 mL/hr over 30 Minutes Intravenous Every 24 hours 12/05/24 0754 12/05/24 2323   12/05/24 2324  cefTRIAXone  (ROCEPHIN ) 2 g in sodium chloride  0.9 % 100 mL IVPB        2 g 200 mL/hr over 30 Minutes Intravenous Daily at bedtime 12/05/24 2323     12/05/24 1800  oseltamivir  (TAMIFLU ) capsule 30 mg        30 mg Oral 2 times daily 12/05/24  0747 12/09/24 2100   12/05/24 0600  oseltamivir  (TAMIFLU ) capsule 75 mg  Status:  Discontinued        75 mg Oral  Once 12/05/24 0553 12/05/24 0554   12/05/24 0600  oseltamivir  (TAMIFLU ) capsule 30 mg        30 mg Oral  Once 12/05/24 0554 12/05/24 0624   12/05/24 0430  cefTRIAXone  (ROCEPHIN ) 1 g in sodium chloride  0.9 % 100 mL IVPB        1 g 200 mL/hr over 30 Minutes Intravenous  Once 12/05/24 0424 12/05/24 0503   12/05/24 0430  azithromycin  (ZITHROMAX ) 500 mg in sodium chloride  0.9 % 250 mL IVPB        500 mg 250 mL/hr over 60 Minutes Intravenous  Once 12/05/24 0424 12/05/24 0606         Component Value Date/Time   SDES BLOOD RIGHT ARM 12/06/2024 1857   SDES BLOOD RIGHT ARM 12/06/2024 1857   SPECREQUEST AEROBIC BOTTLE ONLY Blood Culture adequate volume 12/06/2024 1857   SPECREQUEST AEROBIC BOTTLE ONLY Blood Culture adequate volume 12/06/2024 1857   CULT  12/06/2024 1857    NO GROWTH 3 DAYS Performed at [facility], [address] [place], [place] [address]    CULT  12/06/2024 1857    NO GROWTH 3 DAYS Performed at [facility], [address], [place], [place] [address]    REPTSTATUS PENDING 12/06/2024 1857   REPTSTATUS PENDING 12/06/2024 1857    Procedures: Procedures (LRB): TRANSESOPHAGEAL ECHOCARDIOGRAM (N/A)   Mennie LAMY, MD [facility] 12/10/2024, 1:15 PM   "

## 2024-12-10 NOTE — Hospital Course (Addendum)
 Rachael Davis is a 69 y.o. female with PMH of t for atrial fibrillation on Coumadin , chronic systolic congestive heart failure with EF 20%, diabetes, hypertension being admitted to the hospital with evidence of acute heart failure in the setting of influenza A infection, pneumonia, Streptococcus bacteremia.  Cardiology, ID consulted. TTE -there was concern for possible IE on the MV s/p TEE which did not identify any mobile mass on the MV but likely suture however  AV did have a small vegetation-normal and medically managed with IV antibiotics no embolic phenomena and blood cultures have cleared 1/3.  Per cardiology no plan for device extraction.ID following-continue ceftriaxone .  Completed Tamiflu  course x 5 day PICC line ordered for home antibiotics-she reports her daughter available to help, husband has Parkinson's and cannot help. continue IV ceftriaxone  x 4 weeks from negative blood cultures on 1/3.  EOT 1/31 Per ID No plans for suppression for ICD given no vegetation in ICD lead and uncommon organism to cause device infection needs to have this blood culture in 2 weeks after IV course is completed  Follow-up with ID follow-up with cardiology need repeat echo in 6 to 12 weeks  Subjective: Seen and examined Resting well no new complaints Placed this morning Overnight events afebrile, on RA, afebrile, VSS, Labs creatinine 1.4> 1.6> 1.3 stable INR 2.4  Discharge Diagnoses:   Acute on chronic systolic heart failure Previous EF < 25% 10/25,Presented with lower extremity edema, orthopnea w/ influenza A, med noncompliance elevated proBNP of 29k Presentation consistent with acute on chronic systolic CHF, cardio following, EF less than 25% with GHK and IE of Aov TEE showed EF 20-25%-mitral valve bioprosthesis without evidence of vegetation.  Small mobile linear echodensity on ventricular side of aortic valve in setting of bacteremia this can be seen in endocarditis.  Mildly dilated pulmonary artery.   Agitated saline contrast bubble study positive with shunting observed after > 6 cardiac cycles suggestive of intrapulmonary shunting. Managed with IV Lasix  and changed to home torsemide  40 daily 12/11/24, cont GDMT-Coreg  12.5 mg,Farxiga , Entresto , k dur Imdur  discontinued due to soft BP. Monitor daily I/O,weight, electrolytes and net balance as below.Net IO Since Admission: -8,219.9 mL [12/11/24 0920]  Filed Weights   12/08/24 0500 12/09/24 0415 12/10/24 0500  Weight: 83.7 kg 75.4 kg 76.1 kg    Severe sepsis secondary to influenza A, probable pneumonia Streptococcus bacteremia Native Streptococcus pneumonia AV endocarditis:  Remains afebrile vital stable. TTE there was concern for possible IE on the MV s/p TEE which did not identify any mobile mass on the MV but likely suture however  AV did have a small vegetation-normal and medically managed with IV antibiotics no embolic phenomena and blood cultures have cleared 1/3.  Per cardiology no plan for device extraction.ID following-continue ceftriaxone .  Completed Tamiflu  course x 5 day PICC line ordered for home antibiotics-she reports her daughter available to help, husband has Parkinson's and cannot help. continue IV ceftriaxone  x 4 weeks from negative blood cultures on 1/3.  EOT 1/31 Per ID No plans for suppression for ICD given no vegetation in ICD lead and uncommon organism to cause device infection needs to have this blood culture in 2 weeks after IV course is completed  Follow-up with ID follow-up with cardiology need repeat echo in 6 to 12 weeks  CKD stage IIIa Creatinine at 1.3 this morning fluctuating.  Monitor while on diuretics/GDMT Recent Labs    09/20/24 0310 09/22/24 1624 10/02/24 1442 12/05/24 0344 12/06/24 0426 12/07/24 0434 12/08/24 0404 12/09/24  9583 12/10/24 0413 12/11/24 0444  BUN 34* 39* 26* 21 35* 36* 35* 30* 29* 30*  CREATININE 1.52* 1.63* 1.76* 1.30* 1.43* 1.47* 1.42* 1.44* 1.63* 1.39*  CO2 25 21 27  19* 22 24 25 23  25 26   K 3.6 4.7 4.3 5.1 4.5 4.1 3.6 4.2 4.1 4.1    Hypertension: BP stable, continue GDMT as above.Imdur  discontinued   T2Dm with hyperglycemia A1c 6.6 (09/16/2024).Blood sugar stable, she will resume her glipizide - dw w/ pcpand nephro  re metformin  due to CKD, continue Farxiga  , SSI Recent Labs  Lab 12/10/24 0737 12/10/24 1137 12/10/24 1615 12/10/24 1958 12/11/24 0744  GLUCAP 147* 173* 205* 168* 110*    PAF Bioprosthetic mitral valve replacement: Continue to monitor on telemetry, continue Coreg , INR therapeutic Coumadin  being dosed by pharmacy Recent Labs  Lab 12/07/24 0434 12/08/24 0404 12/09/24 0416 12/10/24 0413 12/11/24 0444  INR 2.4* 2.8* 3.6* 2.7* 2.4*    History of VT status post [employer] ICD: No VT noted on telemetry. Cardiology following echo finding as above no plan for device extraction.   Class I Obesity w/ Body mass index is 30.69 kg/m.: Will benefit with PCP follow-up, weight loss,healthy lifestyle  DVT prophylaxis: Therapeutic INR  Code Status:   Code Status: Full Code Family Communication: plan of care discussed with patient/ at bedside. Patient status is: Remains hospitalized because of severity of illness Level of care: Telemetry   Dispo: The patient is from: home w. Husband            Anticipated disposition: home /w picc line and iv antibiotics Objective: Vitals last 24 hrs: Vitals:   12/10/24 1958 12/10/24 2019 12/11/24 0426 12/11/24 0914  BP: 113/71  103/72   Pulse: 85  84   Resp: 18  16   Temp: 98.3 F (36.8 C)  98.1 F (36.7 C)   TempSrc: Oral  Oral   SpO2: 100% 100% 99% 96%  Weight:      Height:        Physical Examination: General exam: alert awake, oriented, older than stated age HEENT:Oral mucosa moist, Ear/Nose WNL grossly Respiratory system: Bilaterally clear BS,no use of accessory muscle Cardiovascular system: S1 & S2 +, No JVD. Gastrointestinal system: Abdomen soft,NT,ND, BS+ Nervous System: Alert, awake,  moving all extremities,and following commands. Extremities: extremities warm, leg edema neg Skin: Warm, no rashes MSK: Normal muscle bulk,tone, power

## 2024-12-10 NOTE — Progress Notes (Signed)
 PHARMACY CONSULT NOTE FOR:  OUTPATIENT  PARENTERAL ANTIBIOTIC THERAPY (OPAT)  Indication: Strep PNA bacteremia + AV IE Regimen: Rocephin  2g IV every 24 hours End date: 01/03/25  IV antibiotic discharge orders are pended. To discharging provider:  please sign these orders via discharge navigator,  Select New Orders & click on the button choice - Manage This Unsigned Work.     Thank you for allowing pharmacy to be a part of this patients care.  Almarie Lunger, PharmD, BCPS, BCIDP Infectious Diseases Clinical Pharmacist 12/10/2024 9:18 AM   **Pharmacist phone directory can now be found on [email or web address] ([email or web address]).  Listed under [facility].

## 2024-12-10 NOTE — Progress Notes (Addendum)
 "                                                            [facility] Infectious Diseases Follow Up Note  Patient Identification: Patient Name: Rachael Davis MRN: [id number] Admit Date: 12/05/2024  3:07 AM Age: 69 y.o.Today's Date: 12/10/2024  Reason for Visit: streptococcus pneumonia bacteremia, endocarditis  Principal Problem:   Acute CHF (congestive heart failure) (HCC) Active Problems:   Paroxysmal atrial fibrillation (HCC)   Acute systolic (congestive) heart failure (HCC)   Bacteremia due to Streptococcus pneumoniae   Community acquired pneumonia   Influenza A with pneumonia   Severe sepsis (HCC)   S/P mitral valve replacement with bioprosthetic valve   Type 2 diabetes mellitus with stage 3a chronic kidney disease, without long-term current use of insulin  (HCC)   Endocarditis   Streptococcal bacteremia  Antibiotics: Oseltamivir  1/1- Ceftriaxone  1/2- Azithromycin  1/1  Lines/Hardwares:   Interval Events: Remains afebrile. Labs remarkable for BG 204, creatinine 1.63, WBC 7.7, hemoglobin 11.9  Assessment 69 year old female with prior history as below including A-fib on AC, CHF, DM, HTN, s/p bioprosthetic MVR, s/p VT s/p ICD admitted with  # Influenza A pneumonia with secondary streptococcal pneumonia bacteremia - s/p 5 days course of Oseltamivir   # Native Streptococcus pneumoniae AV endocarditis - 1/3 blood cx NG in 3 days  - 1/4 TTE on the left ventricular side of the mitral  valve there is an independently mobile  echodensity measuring 0.31 cm x 0.97 cm concerning for possible infective  endocarditis. This was not visible on the echocardiogram dated 09/17/24.  - 1/5 TEE Small aortic valve vegetation.  No prosthetic mitral valve vegetation seen on this study.  Normal device lead. - Evaluated by EP, no concern for vegetation on the right ventricular ICD lead.  No plans for device extraction.  - No indication for surgery   # Acute on chronic CHF  # p A Fib - Cardiology  following   # CKD - cr upto 1.6  Recommendations - continue IV ceftriaxone .  plan for 4 weeks of IV ceftriaxone  from negative blood cultures on 1/3.  EOT 1/31 - No plans for suppression for ICD given no vegetation in ICD lead and uncommon organism to cause cardiac device infection. Will repeat surveillance blood cx at least 2 weeks after IV course completed.  - Place PICC = Fu with Cardiology, plan to repeat TTE in 6-12 weeks noted  - Monitor CBC and BMP - Droplet precautions D/w primary team ID will SO.   OPAT Diagnosis: Native aortic valve endocarditis  Culture Result: Streptococcus pneumoniae  Allergies[1]  OPAT Orders Discharge antibiotics to be given via PICC line Discharge antibiotics: Ceftriaxone  2 g IV daily Per pharmacy protocol Duration: 4 weeks  End Date: 1/31  [facility] Care Per Protocol:  Home health RN for IV administration and teaching; PICC line care and labs.    Labs weekly while on IV antibiotics: X__ CBC with differential __ BMP X__ CMP __ CRP __ ESR __ Vancomycin  trough __ CK  X__ Please pull PIC at completion of IV antibiotics __ Please leave PIC in place until doctor has seen patient or been notified  Fax weekly labs to [phone]  Clinic Follow Up Appt: 1/20 @9 : 45 am   Rest of the management  as per the primary team. Thank you for the consult. Please page with pertinent questions or concerns.  ______________________________________________________________________ Subjective patient seen and examined at the bedside. Feels better, getting breathing tx.    Past Medical History:  Diagnosis Date   Arrhythmia    Hx PAF, PVC's   CHF (congestive heart failure) (HCC)    Coronary artery disease    04/04/2017 STEMI   [facility])    H/O mitral valve replacement 09/21/2021   Heart attack (HCC)    per pt reprot   Hyperlipidemia    Hypertension    New onset atrial fibrillation (HCC) 05/2017   Past Surgical History:  Procedure Laterality  Date   CORONARY ARTERY BYPASS GRAFT N/A 04/12/2017   Procedure: CORONARY ARTERY BYPASS GRAFTING (CABG), ON PUMP, TIMES FOUR, USING LEFT INTERNAL MAMMARY ARTERY AND ENDOSCOPICALLY HARVESTED RIGHT GREATER SAPHENOUS VEIN;  Surgeon: Fleeta Hanford Coy, MD;  Location[facility] OR;  Service: Open Heart Surgery;  Laterality: N/A;  LIMA to LAD; SVG to OM; SVG to Ramus; SVG to Right   ICD IMPLANT N/A 06/30/2022   Procedure: ICD IMPLANT;  Surgeon: Fernande Elspeth BROCKS, MD;  Location: [facility] INVASIVE CV LAB;  Service: Cardiovascular;  Laterality: N/A;   IR THORACENTESIS ASP PLEURAL SPACE W/IMG GUIDE  05/25/2017   LEFT HEART CATH AND CORONARY ANGIOGRAPHY N/A 04/04/2017   Procedure: Left Heart Cath and Coronary Angiography;  Surgeon: Peter M Jordan, MD;  Location[facility] INVAS[facility] CV LAB;  Service: Cardiovascular;  Laterality: N/A;   MITRAL VALVE REPAIR N/A 04/12/2017   Procedure: MITRAL VALVE REPLACEMENT (MVR);  Surgeon: Fleeta Hanford, Coy, MD;  Location: [facility] OR;  Service: Open Heart Surgery;  Laterality: N/A;  Using a 25mm Magna Ease Mitral Pericardial Bioprosthesis   RIGHT/LEFT HEART CATH AND CORONARY/GRAFT ANGIOGRAPHY N/A 04/20/2022   Procedure: RIGHT/LEFT HEART CATH AND CORONARY/GRAFT ANGIOGRAPHY;  Surgeon: Cherrie Toribio SAUNDERS, MD;  Location: [facility] INVASIVE CV LAB;  Service: Cardiovascular;  Laterality: N/A;   TEE WITHOUT CARDIOVERSION N/A 04/12/2017   Procedure: TRANSESOPHAGEAL ECHOCARDIOGRAM (TEE);  Surgeon: Fleeta Hanford, Coy, MD;  Location: [facility] OR;  Service: Open Heart Surgery;  Laterality: N/A;   TRANSESOPHAGEAL ECHOCARDIOGRAM (CATH LAB) N/A 12/08/2024   Procedure: TRANSESOPHAGEAL ECHOCARDIOGRAM;  Surgeon: Santo Stanly LABOR, MD;  Location: [facility] INVASIVE CV LAB;  Service: Cardiovascular;  Laterality: N/A;   TUBAL LIGATION      Vitals BP 106/67 (BP Location: Left Arm)   Pulse 80   Temp 98.3 F (36.8 C) (Oral)   Resp 16   Ht 5' 2 (1.575 m)   Wt 76.1 kg   SpO2 97%   BMI 30.69 kg/m     Physical Exam Constitutional:  adult  female sitting in the recliner, non toxic appearing     Comments: HEENT wnl   Cardiovascular:     Rate and Rhythm: Normal rate    Heart sounds  Pulmonary:     Effort: Pulmonary effort is normal.     Comments:   Abdominal:     Palpations:     Tenderness: non distended  Musculoskeletal:        General: No swelling or tenderness in peripheral joints   Skin:    Comments: no rashes   Neurological:     General: awake, alert and oriented, non focal   Psychiatric:        Mood and Affect: Mood normal.   Pertinent Microbiology Results for orders placed or performed during the hospital encounter of 12/05/24  Resp panel by RT-PCR (RSV,  Flu A&B, Covid) Anterior Nasal Swab     Status: Abnormal   Collection Time: 12/05/24  3:44 AM   Specimen: Anterior Nasal Swab  Result Value Ref Range Status   SARS Coronavirus 2 by RT PCR NEGATIVE NEGATIVE Final    Comment: (NOTE) SARS-CoV-2 target nucleic acids are NOT DETECTED.  The SARS-CoV-2 RNA is generally detectable in upper respiratory specimens during the acute phase of infection. The lowest concentration of SARS-CoV-2 viral copies this assay can detect is 138 copies/mL. A negative result does not preclude SARS-Cov-2 infection and should not be used as the sole basis for treatment or other patient management decisions. A negative result may occur with  improper specimen collection/handling, submission of specimen other than nasopharyngeal swab, presence of viral mutation(s) within the areas targeted by this assay, and inadequate number of viral copies(<138 copies/mL). A negative result must be combined with clinical observations, patient history, and epidemiological information. The expected result is Negative.  Fact Sheet for Patients:  bloggercourse.com  Fact Sheet for Healthcare Providers:  seriousbroker.it  This test is no t yet approved or cleared by the [place]  FDA and   has been authorized for detection and/or diagnosis of SARS-CoV-2 by FDA under an Emergency Use Authorization (EUA). This EUA will remain  in effect (meaning this test can be used) for the duration of the COVID-19 declaration under Section 564(b)(1) of the Act, 21 U.S.C.section 360bbb-3(b)(1), unless the authorization is terminated  or revoked sooner.       Influenza A by PCR POSITIVE (A) NEGATIVE Final   Influenza B by PCR NEGATIVE NEGATIVE Final    Comment: (NOTE) The Xpert Xpress SARS-CoV-2/FLU/RSV plus assay is intended as an aid in the diagnosis of influenza from Nasopharyngeal swab specimens and should not be used as a sole basis for treatment. Nasal washings and aspirates are unacceptable for Xpert Xpress SARS-CoV-2/FLU/RSV testing.  Fact Sheet for Patients: bloggercourse.com  Fact Sheet for Healthcare Providers: seriousbroker.it  This test is not yet approved or cleared by the [place]  FDA and has been authorized for detection and/or diagnosis of SARS-CoV-2 by FDA under an Emergency Use Authorization (EUA). This EUA will remain in effect (meaning this test can be used) for the duration of the COVID-19 declaration under Section 564(b)(1) of the Act, 21 U.S.C. section 360bbb-3(b)(1), unless the authorization is terminated or revoked.     Resp Syncytial Virus by PCR NEGATIVE NEGATIVE Final    Comment: (NOTE) Fact Sheet for Patients: bloggercourse.com  Fact Sheet for Healthcare Providers: seriousbroker.it  This test is not yet approved or cleared by the [place]  FDA and has been authorized for detection and/or diagnosis of SARS-CoV-2 by FDA under an Emergency Use Authorization (EUA). This EUA will remain in effect (meaning this test can be used) for the duration of the COVID-19 declaration under Section 564(b)(1) of the Act, 21 U.S.C. section 360bbb-3(b)(1),  unless the authorization is terminated or revoked.  Performed at [facility], [address], [place], [place] [address]   Blood culture (routine x 2)     Status: Abnormal   Collection Time: 12/05/24  4:05 AM   Specimen: BLOOD RIGHT WRIST  Result Value Ref Range Status   Specimen Description   Final    BLOOD RIGHT WRIST Performed at [facility], [address] [place], [place] [address]    Special Requests   Final    BOTTLES DRAWN AEROBIC AND ANAEROBIC Blood Culture results may not be optimal due to an  inadequate volume of blood received in culture bottles Performed at [facility], [address] [place], [place] [address]    Culture  Setup Time   Final    GRAM POSITIVE COCCI IN PAIRS IN BOTH AEROBIC AND ANAEROBIC BOTTLES CRITICAL RESULT CALLED TO, READ BACK BY AND VERIFIED WITH: PHARMD M. BELL [id number] @ 2322 FH Performed at [facility], [address] [place], [place] [address]    Culture STREPTOCOCCUS PNEUMONIAE (A)  Final   Report Status 12/07/2024 FINAL  Final   Organism ID, Bacteria STREPTOCOCCUS PNEUMONIAE  Final      Susceptibility   Streptococcus pneumoniae - MIC*    ERYTHROMYCIN <=0.12 SENSITIVE Sensitive     LEVOFLOXACIN  0.5 SENSITIVE Sensitive     VANCOMYCIN  0.25 SENSITIVE Sensitive     PENICILLIN (meningitis) 0.25 RESISTANT Resistant     PENO - penicillin 0.25      PENICILLIN (non-meningitis) 0.25 SENSITIVE Sensitive     PENICILLIN (oral) 0.25 INTERMEDIATE Intermediate     CEFTRIAXONE  (non-meningitis) 0.25 SENSITIVE Sensitive     CEFTRIAXONE  (meningitis) 0.25 SENSITIVE Sensitive     * STREPTOCOCCUS PNEUMONIAE  Blood Culture ID Panel (Reflexed)     Status: Abnormal   Collection Time: 12/05/24  4:05 AM  Result Value Ref Range Status   Enterococcus faecalis NOT DETECTED NOT DETECTED Final   Enterococcus Faecium NOT DETECTED NOT DETECTED Final   Listeria monocytogenes NOT DETECTED NOT DETECTED Final    Staphylococcus species NOT DETECTED NOT DETECTED Final   Staphylococcus aureus (BCID) NOT DETECTED NOT DETECTED Final   Staphylococcus epidermidis NOT DETECTED NOT DETECTED Final   Staphylococcus lugdunensis NOT DETECTED NOT DETECTED Final   Streptococcus species DETECTED (A) NOT DETECTED Final    Comment: CRITICAL RESULT CALLED TO, READ BACK BY AND VERIFIED WITH: PHARMD M. BELL [id number] @ 2322 FH    Streptococcus agalactiae NOT DETECTED NOT DETECTED Final   Streptococcus pneumoniae DETECTED (A) NOT DETECTED Final    Comment: CRITICAL RESULT CALLED TO, READ BACK BY AND VERIFIED WITH: PHARMD M. BELL [id number] @ 2322 FH    Streptococcus pyogenes NOT DETECTED NOT DETECTED Final   A.calcoaceticus-baumannii NOT DETECTED NOT DETECTED Final   Bacteroides fragilis NOT DETECTED NOT DETECTED Final   Enterobacterales NOT DETECTED NOT DETECTED Final   Enterobacter cloacae complex NOT DETECTED NOT DETECTED Final   Escherichia coli NOT DETECTED NOT DETECTED Final   Klebsiella aerogenes NOT DETECTED NOT DETECTED Final   Klebsiella oxytoca NOT DETECTED NOT DETECTED Final   Klebsiella pneumoniae NOT DETECTED NOT DETECTED Final   Proteus species NOT DETECTED NOT DETECTED Final   Salmonella species NOT DETECTED NOT DETECTED Final   Serratia marcescens NOT DETECTED NOT DETECTED Final   Haemophilus influenzae NOT DETECTED NOT DETECTED Final   Neisseria meningitidis NOT DETECTED NOT DETECTED Final   Pseudomonas aeruginosa NOT DETECTED NOT DETECTED Final   Stenotrophomonas maltophilia NOT DETECTED NOT DETECTED Final   Candida albicans NOT DETECTED NOT DETECTED Final   Candida auris NOT DETECTED NOT DETECTED Final   Candida glabrata NOT DETECTED NOT DETECTED Final   Candida krusei NOT DETECTED NOT DETECTED Final   Candida parapsilosis NOT DETECTED NOT DETECTED Final   Candida tropicalis NOT DETECTED NOT DETECTED Final   Cryptococcus neoformans/gattii NOT DETECTED NOT DETECTED Final    Comment: Performed at  [facility], [address] [place], [place] [address]  Blood culture (routine x 2)     Status: None   Collection Time: 12/05/24  3:13 PM   Specimen: BLOOD RIGHT ARM  Result Value Ref Range Status   Specimen Description   Final    BLOOD RIGHT ARM Performed at [facility], [address], [place], [place] [address]    Special Requests   Final    BOTTLES DRAWN AEROBIC ONLY Blood Culture results may not be optimal due to an inadequate volume of blood received in culture bottles Performed at [facility], [address], [place], [place] [address]    Culture   Final    NO GROWTH 5 DAYS Performed at [facility], [address], [place], [place] [address]    Report Status 12/10/2024 FINAL  Final  Culture, blood (Routine X 2) w Reflex to ID Panel     Status: None (Preliminary result)   Collection Time: 12/06/24  6:57 PM   Specimen: BLOOD RIGHT ARM  Result Value Ref Range Status   Specimen Description BLOOD RIGHT ARM  Final   Special Requests AEROBIC BOTTLE ONLY Blood Culture adequate volume  Final   Culture   Final    NO GROWTH 3 DAYS Performed at [facility], [address] [place], [place] [address]    Report Status PENDING  Incomplete  Culture, blood (Routine X 2) w Reflex to ID Panel     Status: None (Preliminary result)   Collection Time: 12/06/24  6:57 PM   Specimen: BLOOD RIGHT ARM  Result Value Ref Range Status   Specimen Description BLOOD RIGHT ARM  Final   Special Requests AEROBIC BOTTLE ONLY Blood Culture adequate volume  Final   Culture   Final    NO GROWTH 3 DAYS Performed at [facility], [address] [place], [place] [address]    Report Status PENDING  Incomplete   Pertinent Lab.    Latest Ref Rng & Units 12/10/2024    4:13 AM 12/09/2024    4:16 AM 12/08/2024    4:04 AM  CBC  WBC 4.0 - 10.5 K/uL 7.7  7.4  7.1   Hemoglobin 12.0 - 15.0 g/dL 88.0  87.8  87.5   Hematocrit 36.0 - 46.0 % 39.5  38.4  40.3    Platelets 150 - 400 K/uL 206  187  193        Latest Ref Rng & Units 12/10/2024    4:13 AM 12/09/2024    4:16 AM 12/08/2024    4:04 AM  CMP  Glucose 70 - 99 mg/dL 795  835  856   BUN 8 - 23 mg/dL 29  30  35   Creatinine 0.44 - 1.00 mg/dL 8.36  8.55  8.57   Sodium 135 - 145 mmol/L 139  140  140   Potassium 3.5 - 5.1 mmol/L 4.1  4.2  3.6   Chloride 98 - 111 mmol/L 103  105  103   CO2 22 - 32 mmol/L 25  23  25    Calcium  8.9 - 10.3 mg/dL 8.8  8.5  8.5    Pertinent Imaging today Plain films and CT images have been personally visualized and interpreted; radiology reports have been reviewed. Decision making incorporated into the Impression US  EKG SITE RITE Result Date: 12/10/2024 If Site Rite image not attached, placement could not be confirmed due to current cardiac rhythm.  ECHO TEE Result Date: 12/08/2024    TRANSESOPHOGEAL ECHO REPORT   Patient Name:   Rachael Davis Date of Exam: 12/08/2024 Medical Rec #:  [id number]  Height:       62.0 in Accession #:    [id number]      Weight:       184.5 lb Date of Birth:  1956/03/08       BSA:          1.847 m Patient Age:    68 years        BP:           124/77 mmHg Patient Gender: F               HR:           76 bpm. Exam Location:  Inpatient Procedure: Transesophageal Echo, 3D Echo, Color Doppler, Cardiac Doppler and            Saline Contrast Bubble Study (Both Spectral and Color Flow Doppler            were utilized during procedure). Indications:     Endocarditis  History:         Patient has prior history of Echocardiogram examinations, most                  recent 12/07/2024. CHF; Arrythmias:Atrial Fibrillation.  Sonographer:     Philomena Daring Referring Phys:  [id number] MAHESH A SANTO Diagnosing Phys: Stanly Santo MD PROCEDURE: After discussion of the risks and benefits of a TEE, an informed consent was obtained from the patient. The transesophogeal probe was passed without difficulty through the esophogus of the patient. Imaged were  obtained with the patient in a left lateral decubitus position. Sedation performed by different physician. The patient was monitored while under deep sedation. Anesthestetic sedation was provided intravenously by Anesthesiology: 50mg  of Propofol , 100mg  of Lidocaine . The patient developed no complications during the procedure.  IMPRESSIONS  1. Left ventricular ejection fraction, by estimation, is 20 to 25%. The left ventricle has severely decreased function.  2. Right ventricular systolic function is moderately reduced. The right ventricular size is not well visualized.  3. Heavy left atrial smoke. No left atrial/left atrial appendage thrombus was detected. The LAA emptying velocity was 18 cm/s.  4. 25mm Magna Ease Mitral Pericardial Bioprosthesis without evidence of vegetation. The mitral valve has been repaired/replaced. No evidence of mitral valve regurgitation. No evidence of mitral stenosis.  5. Small (<1 cm) mobile linear echodensity send predominantly on the ventricular side of the aortic valve. In the setting of bacteremia, this can be seen in endocarditis. The aortic valve is tricuspid. Aortic valve regurgitation is not visualized. No aortic stenosis is present.  6. Mildly dilated pulmonary artery.  7. Agitated saline contrast bubble study was positive with shunting observed after >6 cardiac cycles suggestive of intrapulmonary shunting.  8. 3D performed of the mitral valve and demonstrates Sutures ntoed, but not IE seen on 3D MV. FINDINGS  Left Ventricle: Left ventricular ejection fraction, by estimation, is 20 to 25%. The left ventricle has severely decreased function. The left ventricular internal cavity size was normal in size. Right Ventricle: The right ventricular size is not well visualized. Right vetricular wall thickness was not well visualized. Right ventricular systolic function is moderately reduced. Left Atrium: Heavy left atrial smoke. Left atrial size was normal in size. No left atrial/left  atrial appendage thrombus was detected. The LAA emptying velocity was 18 cm/s. Right Atrium: Right atrial size was normal in size. Pericardium: There is no evidence of pericardial effusion. Mitral Valve: 25mm Magna Ease Mitral Pericardial Bioprosthesis without evidence of vegetation. The mitral valve has  been repaired/replaced. No evidence of mitral valve regurgitation. There is a 25 mm Magna Ease bioprosthetic valve present in the mitral position. No evidence of mitral valve stenosis. Tricuspid Valve: The tricuspid valve is normal in structure. Tricuspid valve regurgitation is mild . No evidence of tricuspid stenosis. Aortic Valve: Small (<1 cm) mobile linear echodensity send predominantly on the ventricular side of the aortic valve. In the setting of bacteremia, this can be seen in endocarditis. The aortic valve is tricuspid. Aortic valve regurgitation is not visualized. No aortic stenosis is present. Pulmonic Valve: The pulmonic valve was normal in structure. Pulmonic valve regurgitation is not visualized. No evidence of pulmonic stenosis. Aorta: The aortic root, ascending aorta, aortic arch and descending aorta are all structurally normal, with no evidence of dilitation or obstruction. Pulmonary Artery: The pulmonary artery is mildly dilated. IAS/Shunts: No atrial level shunt detected by color flow Doppler. Agitated saline contrast was given intravenously to evaluate for intracardiac shunting. Agitated saline contrast bubble study was positive with shunting observed after >6 cardiac cycles suggestive of intrapulmonary shunting. Additional Comments: Spectral Doppler performed. Stanly Leavens MD Electronically signed by Stanly Leavens MD Signature Date/Time: 12/08/2024/4:02:55 PM    Final      I personally spent a total of 50 minutes in the care of the patient today including preparing to see the patient, getting/reviewing separately obtained history, performing a medically appropriate  exam/evaluation, counseling and educating, placing orders, referring and communicating with other health care professionals, documenting clinical information in the EHR, independently interpreting results, and communicating results.    Electronically signed by:   Annalee Orem, MD Infectious Disease Physician [facility] Pager: [phone]     [1]  Allergies Allergen Reactions   Keflex  [Cephalexin ] Rash    Skin on feet peeled.   Asa [Aspirin ]     325 mg ----heartburn Pt can take 81 mg   Enalapril Cough   Lisinopril  Cough   "

## 2024-12-11 ENCOUNTER — Other Ambulatory Visit (HOSPITAL_COMMUNITY): Payer: Self-pay

## 2024-12-11 ENCOUNTER — Ambulatory Visit

## 2024-12-11 LAB — GLUCOSE, CAPILLARY
Glucose-Capillary: 110 mg/dL — ABNORMAL HIGH (ref 70–99)
Glucose-Capillary: 228 mg/dL — ABNORMAL HIGH (ref 70–99)

## 2024-12-11 LAB — BASIC METABOLIC PANEL WITH GFR
Anion gap: 12 (ref 5–15)
BUN: 30 mg/dL — ABNORMAL HIGH (ref 8–23)
CO2: 26 mmol/L (ref 22–32)
Calcium: 8.7 mg/dL — ABNORMAL LOW (ref 8.9–10.3)
Chloride: 102 mmol/L (ref 98–111)
Creatinine, Ser: 1.39 mg/dL — ABNORMAL HIGH (ref 0.44–1.00)
GFR, Estimated: 41 mL/min — ABNORMAL LOW
Glucose, Bld: 164 mg/dL — ABNORMAL HIGH (ref 70–99)
Potassium: 4.1 mmol/L (ref 3.5–5.1)
Sodium: 139 mmol/L (ref 135–145)

## 2024-12-11 LAB — PROTIME-INR
INR: 2.4 — ABNORMAL HIGH (ref 0.8–1.2)
Prothrombin Time: 27.2 s — ABNORMAL HIGH (ref 11.4–15.2)

## 2024-12-11 MED ORDER — LORATADINE 10 MG PO TABS
10.0000 mg | ORAL_TABLET | Freq: Every day | ORAL | 0 refills | Status: AC
  Filled 2024-12-11: qty 7, 7d supply, fill #0

## 2024-12-11 MED ORDER — CHLORHEXIDINE GLUCONATE CLOTH 2 % EX PADS: 6.0000 | MEDICATED_PAD | Freq: Every day | CUTANEOUS | Status: DC

## 2024-12-11 MED ORDER — SODIUM CHLORIDE 0.9 % IV SOLN
2.0000 g | Freq: Every day | INTRAVENOUS | Status: DC
  Administered 2024-12-11: 2 g via INTRAVENOUS
  Filled 2024-12-11: qty 20

## 2024-12-11 MED ORDER — POTASSIUM CHLORIDE CRYS ER 20 MEQ PO TBCR
20.0000 meq | EXTENDED_RELEASE_TABLET | Freq: Every day | ORAL | 0 refills | Status: AC
  Filled 2024-12-11: qty 30, 30d supply, fill #0

## 2024-12-11 MED ORDER — HEPARIN SOD (PORK) LOCK FLUSH 100 UNIT/ML IV SOLN
250.0000 [IU] | INTRAVENOUS | Status: AC | PRN
  Administered 2024-12-11: 250 [IU]
  Filled 2024-12-11: qty 2.5

## 2024-12-11 MED ORDER — SODIUM CHLORIDE 0.9% FLUSH
10.0000 mL | INTRAVENOUS | Status: DC | PRN
  Administered 2024-12-11: 10 mL

## 2024-12-11 MED ORDER — ROBAFEN DM 20-200 MG/20ML PO LIQD
5.0000 mL | ORAL | 0 refills | Status: AC | PRN
  Filled 2024-12-11: qty 118, 4d supply, fill #0

## 2024-12-11 MED ORDER — WARFARIN SODIUM 5 MG PO TABS: 5.0000 mg | ORAL_TABLET | Freq: Every day | ORAL | Status: DC

## 2024-12-11 MED ORDER — PANTOPRAZOLE SODIUM 40 MG PO TBEC
40.0000 mg | DELAYED_RELEASE_TABLET | Freq: Every day | ORAL | 0 refills | Status: AC
  Filled 2024-12-11: qty 30, 30d supply, fill #0

## 2024-12-11 MED ORDER — HEPARIN SOD (PORK) LOCK FLUSH 100 UNIT/ML IV SOLN
250.0000 [IU] | INTRAVENOUS | Status: AC | PRN
  Administered 2024-12-11: 250 [IU]

## 2024-12-11 NOTE — Progress Notes (Signed)
 Peripherally Inserted Central Catheter Placement  The IV Nurse has discussed with the patient and/or persons authorized to consent for the patient, the purpose of this procedure and the potential benefits and risks involved with this procedure.  The benefits include less needle sticks, lab draws from the catheter, and the patient may be discharged home with the catheter. Risks include, but not limited to, infection, bleeding, blood clot (thrombus formation), and puncture of an artery; nerve damage and irregular heartbeat and possibility to perform a PICC exchange if needed/ordered by physician.  Alternatives to this procedure were also discussed.  Bard Power PICC patient education guide, fact sheet on infection prevention and patient information card has been provided to patient /or left at bedside. PICC inserted by Glenys Sable RN.  PICC Placement Documentation  PICC Single Lumen 12/11/24 Right Cephalic 36 cm 0 cm (Active)  Indication for Insertion or Continuance of Line Home intravenous therapies (PICC only) 12/11/24 0850  Exposed Catheter (cm) 0 cm 12/11/24 0850  Site Assessment Clean, Dry, Intact 12/11/24 0850  Line Status Flushed;Saline locked;Blood return noted 12/11/24 0850  Dressing Type Transparent;Securing device 12/11/24 0850  Dressing Status Antimicrobial disc/dressing in place;Clean, Dry, Intact 12/11/24 0850  Line Care Connections checked and tightened 12/11/24 0850  Line Adjustment (NICU/IV Team Only) No 12/11/24 0850  Dressing Intervention New dressing;Adhesive placed at insertion site (IV team only) 12/11/24 0850  Dressing Change Due 12/18/24 12/11/24 0850       Jolee Na 12/11/2024, 8:51 AM

## 2024-12-11 NOTE — Progress Notes (Signed)
 PHARMACY - ANTICOAGULATION CONSULT NOTE  Pharmacy Consult for warfarin Indication: atrial fibrillation  Allergies[1]  Patient Measurements: Height: 5' 2 (157.5 cm) Weight: 76.1 kg (167 lb 12.3 oz) IBW/kg (Calculated) : 50.1 HEPARIN  DW (KG): 68.2  Vital Signs: Temp: 98.1 F (36.7 C) (01/08 0426) Temp Source: Oral (01/08 0426) BP: 103/72 (01/08 0426) Pulse Rate: 84 (01/08 0426)  Labs: Recent Labs    12/09/24 0416 12/10/24 0413 12/11/24 0444  HGB 12.1 11.9*  --   HCT 38.4 39.5  --   PLT 187 206  --   LABPROT 37.8* 29.7* 27.2*  INR 3.6* 2.7* 2.4*  CREATININE 1.44* 1.63*  --     Estimated Creatinine Clearance: 31.1 mL/min (A) (by C-G formula based on SCr of 1.63 mg/dL (H)).   Medical History: Past Medical History:  Diagnosis Date   Arrhythmia    Hx PAF, PVC's   CHF (congestive heart failure) (HCC)    Coronary artery disease    04/04/2017 STEMI   Diabetes [facility])    H/O mitral valve replacement 09/21/2021   Heart attack (HCC)    per pt reprot   Hyperlipidemia    Hypertension    New onset atrial fibrillation (HCC) 05/2017    Medications:  Warfarin 5 mg daily PTA Last dose 12/30 Dose consistent with last visit to anticoag clinic 12/11  Assessment: 69 year old patient with history of atrial fibrillation on warfarin PTA presents with shortness of breath, positive for influenza with concern for CAP. Blood cultures returned positive for strep pneumoniae.  12/11/2024 INR 2.4 - therapeutic No complications of therapy noted   Goal of Therapy:  INR 2-3 Monitor platelets by anticoagulation protocol: Yes   Plan:  -Continue warfarin 5 mg daily -Daily INR for now  Stefano MARLA Bologna, PharmD, BCPS Clinical Pharmacist 12/11/2024 7:23 AM           [1]  Allergies Allergen Reactions   Keflex  [Cephalexin ] Rash    Skin on feet peeled.   Asa [Aspirin ]     325 mg ----heartburn Pt can take 81 mg   Enalapril Cough   Lisinopril  Cough

## 2024-12-11 NOTE — Discharge Summary (Signed)
 Physician Discharge Summary  CONCETTINA LETH FMW:[id number] DOB: [birth year] DOA: 12/05/2024  PCP: Sun, Vyvyan, MD  Admit date: 12/05/2024 Discharge date: 12/11/2024 Recommendations for Outpatient Follow-up:  Follow up with PCP in 1 weeks-call for appointment Please obtain BMP/CBC in one week Repeat lab in 6 to 12 weeks, follow-up with cardiology Surveillance culture 2 weeks after IV course is completed follow-up with ID  Discharge Dispo: home /w hh Discharge Condition: Stable Code Status:   Code Status: Full Code Diet recommendation:  Diet Order             Diet Carb Modified           Diet - low sodium heart healthy           Diet 2 gram sodium Room service appropriate? Yes; Fluid consistency: Thin  Diet effective now                    Brief/Interim Summary: COSANDRA PLOUFFE is a 69 y.o. female with PMH of t for atrial fibrillation on Coumadin , chronic systolic congestive heart failure with EF 20%, diabetes, hypertension being admitted to the hospital with evidence of acute heart failure in the setting of influenza A infection, pneumonia, Streptococcus bacteremia.  Cardiology, ID consulted. TTE -there was concern for possible IE on the MV s/p TEE which did not identify any mobile mass on the MV but likely suture however  AV did have a small vegetation-normal and medically managed with IV antibiotics no embolic phenomena and blood cultures have cleared 1/3.  Per cardiology no plan for device extraction.ID following-continue ceftriaxone .  Completed Tamiflu  course x 5 day PICC line ordered for home antibiotics-she reports her daughter available to help, husband has Parkinson's and cannot help. continue IV ceftriaxone  x 4 weeks from negative blood cultures on 1/3.  EOT 1/31 Per ID No plans for suppression for ICD given no vegetation in ICD lead and uncommon organism to cause device infection needs to have this blood culture in 2 weeks after IV course is completed  Follow-up with ID  follow-up with cardiology need repeat echo in 6 to 12 weeks  Subjective: Seen and examined Resting well no new complaints Placed this morning Overnight events afebrile, on RA, afebrile, VSS, Labs creatinine 1.4> 1.6> 1.3 stable INR 2.4  Discharge Diagnoses:   Acute on chronic systolic heart failure Previous EF < 25% 10/25,Presented with lower extremity edema, orthopnea w/ influenza A, med noncompliance elevated proBNP of 29k Presentation consistent with acute on chronic systolic CHF, cardio following, EF less than 25% with GHK and IE of Aov TEE showed EF 20-25%-mitral valve bioprosthesis without evidence of vegetation.  Small mobile linear echodensity on ventricular side of aortic valve in setting of bacteremia this can be seen in endocarditis.  Mildly dilated pulmonary artery.  Agitated saline contrast bubble study positive with shunting observed after > 6 cardiac cycles suggestive of intrapulmonary shunting. Managed with IV Lasix  and changed to home torsemide  40 daily 12/11/24, cont GDMT-Coreg  12.5 mg,Farxiga , Entresto , k dur Imdur  discontinued due to soft BP. Monitor daily I/O,weight, electrolytes and net balance as below.Net IO Since Admission: -8,219.9 mL [12/11/24 0920]  Filed Weights   12/08/24 0500 12/09/24 0415 12/10/24 0500  Weight: 83.7 kg 75.4 kg 76.1 kg    Severe sepsis secondary to influenza A, probable pneumonia Streptococcus bacteremia Native Streptococcus pneumonia AV endocarditis:  Remains afebrile vital stable. TTE there was concern for possible IE on the MV s/p TEE which did not identify any mobile  mass on the MV but likely suture however  AV did have a small vegetation-normal and medically managed with IV antibiotics no embolic phenomena and blood cultures have cleared 1/3.  Per cardiology no plan for device extraction.ID following-continue ceftriaxone .  Completed Tamiflu  course x 5 day PICC line ordered for home antibiotics-she reports her daughter available to help,  husband has Parkinson's and cannot help. continue IV ceftriaxone  x 4 weeks from negative blood cultures on 1/3.  EOT 1/31 Per ID No plans for suppression for ICD given no vegetation in ICD lead and uncommon organism to cause device infection needs to have this blood culture in 2 weeks after IV course is completed  Follow-up with ID follow-up with cardiology need repeat echo in 6 to 12 weeks  CKD stage IIIa Creatinine at 1.3 this morning fluctuating.  Monitor while on diuretics/GDMT Recent Labs    09/20/24 0310 09/22/24 1624 10/02/24 1442 12/05/24 0344 12/06/24 0426 12/07/24 0434 12/08/24 0404 12/09/24 0416 12/10/24 0413 12/11/24 0444  BUN 34* 39* 26* 21 35* 36* 35* 30* 29* 30*  CREATININE 1.52* 1.63* 1.76* 1.30* 1.43* 1.47* 1.42* 1.44* 1.63* 1.39*  CO2 25 21 27  19* 22 24 25 23 25 26   K 3.6 4.7 4.3 5.1 4.5 4.1 3.6 4.2 4.1 4.1    Hypertension: BP stable, continue GDMT as above.Imdur  discontinued   T2Dm with hyperglycemia A1c 6.6 (09/16/2024).Blood sugar stable, she will resume her glipizide - dw w/ pcpand nephro  re metformin  due to CKD, continue Farxiga  , SSI Recent Labs  Lab 12/10/24 0737 12/10/24 1137 12/10/24 1615 12/10/24 1958 12/11/24 0744  GLUCAP 147* 173* 205* 168* 110*    PAF Bioprosthetic mitral valve replacement: Continue to monitor on telemetry, continue Coreg , INR therapeutic Coumadin  being dosed by pharmacy Recent Labs  Lab 12/07/24 0434 12/08/24 0404 12/09/24 0416 12/10/24 0413 12/11/24 0444  INR 2.4* 2.8* 3.6* 2.7* 2.4*    History of VT status post [employer] ICD: No VT noted on telemetry. Cardiology following echo finding as above no plan for device extraction.   Class I Obesity w/ Body mass index is 30.69 kg/m.: Will benefit with PCP follow-up, weight loss,healthy lifestyle  DVT prophylaxis: Therapeutic INR  Code Status:   Code Status: Full Code Family Communication: plan of care discussed with patient/ at bedside. Patient status is:  Remains hospitalized because of severity of illness Level of care: Telemetry   Dispo: The patient is from: home w. Husband            Anticipated disposition: home /w picc line and iv antibiotics Objective: Vitals last 24 hrs: Vitals:   12/10/24 1958 12/10/24 2019 12/11/24 0426 12/11/24 0914  BP: 113/71  103/72   Pulse: 85  84   Resp: 18  16   Temp: 98.3 F (36.8 C)  98.1 F (36.7 C)   TempSrc: Oral  Oral   SpO2: 100% 100% 99% 96%  Weight:      Height:        Physical Examination: General exam: alert awake, oriented, older than stated age HEENT:Oral mucosa moist, Ear/Nose WNL grossly Respiratory system: Bilaterally clear BS,no use of accessory muscle Cardiovascular system: S1 & S2 +, No JVD. Gastrointestinal system: Abdomen soft,NT,ND, BS+ Nervous System: Alert, awake, moving all extremities,and following commands. Extremities: extremities warm, leg edema neg Skin: Warm, no rashes MSK: Normal muscle bulk,tone, power     Procedures (LRB): TRANSESOPHAGEAL ECHOCARDIOGRAM (N/A)  Consultation: See note.  Discharge Instructions  Discharge Instructions     [facility] pharmacist to adjust dose for Vancomycin , Aminoglycosides and other anti-infective therapies as requested by physician.   Complete by: As directed    [facility] to provide Cath Flo 2mg    Complete by: As directed    Administer for PICC line occlusion and as ordered by physician for other access device issues.   Anaphylaxis Kit: Provided to treat any anaphylactic reaction to the medication being provided to the patient if First Dose or when requested by physician   Complete by: As directed    Epinephrine  1mg /ml vial / amp: Administer 0.3mg  (0.53ml) subcutaneously once for moderate to severe anaphylaxis, nurse to call physician and pharmacy when reaction occurs and call 911 if needed for immediate care   Diphenhydramine 50mg /ml IV vial: Administer 25-50mg  IV/IM PRN for first dose reaction,  rash, itching, mild reaction, nurse to call physician and pharmacy when reaction occurs   Sodium Chloride  0.9% NS 500ml IV: Administer if needed for hypovolemic blood pressure drop or as ordered by physician after call to physician with anaphylactic reaction   Change dressing on IV access line weekly and PRN   Complete by: As directed    Diet - low sodium heart healthy   Complete by: As directed    Diet Carb Modified   Complete by: As directed    Discharge instructions   Complete by: As directed    Please call call MD or return to ER for similar or worsening recurring problem that brought you to hospital or if any fever,nausea/vomiting,abdominal pain, uncontrolled pain, chest pain,  shortness of breath or any other alarming symptoms.  Please follow-up your doctor as instructed in a week time and call the office for appointment.  Please avoid alcohol, smoking, or any other illicit substance and maintain healthy habits including taking your regular medications as prescribed.  You were cared for by a hospitalist during your hospital stay. If you have any questions about your discharge medications or the care you received while you were in the hospital after you are discharged, you can call the unit and ask to speak with the hospitalist on call if the hospitalist that took care of you is not available.  Once you are discharged, your primary care physician will handle any further medical issues. Please note that NO REFILLS for any discharge medications will be authorized once you are discharged, as it is imperative that you return to your primary care physician (or establish a relationship with a primary care physician if you do not have one) for your aftercare needs so that they can reassess your need for medications and monitor your lab values    Check blood sugar 3 times a day and bedtime at home. If blood sugar running above 200 less than 70 please call your MD to adjust insulin . If blood  sugars running less 100 do not use insulin  and call MD. If you noticed signs and symptoms of hypoglycemia or low blood sugar like jitteriness, confusion, thirst, tremor, sweating- Check blood sugar, drink sugary drink/biscuits/sweets to increase sugar level and call MD or return to ER.   Flush IV access with Sodium Chloride  0.9% and Heparin  10 units/ml or 100 units/ml   Complete by: As directed    Home infusion instructions - [facility]   Complete by: As directed    Instructions: Flush IV access with Sodium Chloride  0.9% and Heparin  10units/ml or 100units/ml   Change dressing on IV access line: Weekly and PRN   Instructions Cath Flo 2mg :  Administer for PICC Line occlusion and as ordered by physician for other access device   [facility] pharmacist to adjust dose for: Vancomycin , Aminoglycosides and other anti-infective therapies as requested by physician   Increase activity slowly   Complete by: As directed    Method of administration may be changed at the discretion of home infusion pharmacist based upon assessment of the patient and/or caregivers ability to self-administer the medication ordered   Complete by: As directed    No wound care   Complete by: As directed       Allergies as of 12/11/2024       Reactions   Keflex  [cephalexin ] Rash   Skin on feet peeled.   Asa [aspirin ]    325 mg ----heartburn Pt can take 81 mg   Enalapril Cough   Lisinopril  Cough        Medication List     PAUSE taking these medications    metFORMIN  1000 MG tablet Wait to take this until your doctor or other care provider tells you to start again. Commonly known as: GLUCOPHAGE  Take 1,000 mg by mouth 2 (two) times daily with a meal.       STOP taking these medications    hydrALAZINE  25 MG tablet Commonly known as: APRESOLINE    isosorbide  mononitrate 30 MG 24 hr tablet Commonly known as: IMDUR        TAKE these medications    acetaminophen  325 MG  tablet Commonly known as: TYLENOL  Take 2 tablets (650 mg total) by mouth every 6 (six) hours as needed for mild pain or headache.   aspirin  EC 81 MG tablet Take 81 mg by mouth daily.   atorvastatin  40 MG tablet Commonly known as: LIPITOR  TAKE 1 TABLET BY MOUTH EVERY DAY   carvedilol  12.5 MG tablet Commonly known as: COREG  Take 1 tablet (12.5 mg total) by mouth 2 (two) times daily.   cefTRIAXone  IVPB Commonly known as: ROCEPHIN  Inject 2 g into the vein daily for 24 days. Indication:  Strep PNA bacteremia + AV IE First Dose: yes Last Day of Therapy:  01/03/25 Labs - Once weekly:  CBC/D and BMP, Labs - Once weekly: ESR and CRP Method of administration: IV Push Method of administration may be changed at the discretion of home infusion pharmacist based upon assessment of the patient and/or caregiver's ability to self-administer the medication ordered.   dapagliflozin  propanediol 10 MG Tabs tablet Commonly known as: Farxiga  Take 1 tablet (10 mg total) by mouth daily.   Entresto  24-26 MG Generic drug: sacubitril -valsartan  TAKE 1 TABLET BY MOUTH TWICE A DAY   glipiZIDE  10 MG 24 hr tablet Commonly known as: GLUCOTROL  XL Take 1 tablet (10 mg total) by mouth daily with breakfast.   loratadine  10 MG tablet Commonly known as: CLARITIN  Take 1 tablet (10 mg total) by mouth daily.   pantoprazole  40 MG tablet Commonly known as: PROTONIX  Take 1 tablet (40 mg total) by mouth daily.   potassium chloride  SA 20 MEQ tablet Commonly known as: KLOR-CON  M Take 1 tablet (20 mEq total) by mouth daily.   Robafen DM 20-200 MG/20ML Liqd Generic drug: Dextromethorphan -guaiFENesin  Take 5 mLs by mouth every 4 (four) hours as needed.   Rybelsus 14 MG Tabs Generic drug: Semaglutide Take 1 tablet by mouth every morning.   torsemide  20 MG tablet Commonly known as: DEMADEX  Take 2 tablets (40 mg total) by mouth daily.   warfarin 5 MG tablet Commonly known as: COUMADIN  Take as directed. If you  are unsure how to take this medication, talk to your nurse or doctor. Original instructions: TAKE 1 TABLET BY MOUTH DAILY OR AS DIRECTED BY COUMADIN  CLINIC               Discharge Care Instructions  (From admission, onward)           Start     Ordered   12/10/24 0000  Change dressing on IV access line weekly and PRN  (Home infusion instructions - [facility] )        12/10/24 1349            Follow-up Information     Sun, Vyvyan, MD Follow up in 1 week(s).   Specialty: Family Medicine Contact information: [phone] [place] [address] [phone]         Dea Shiner, MD Follow up in 1 week(s).   Specialty: Infectious Diseases Contact information: [address] [place] [address] [phone]                Allergies[1]  The results of significant diagnostics from this hospitalization (including imaging, microbiology, ancillary and laboratory) are listed below for reference.    Microbiology: Recent Results (from the past 240 hours)  Resp panel by RT-PCR (RSV, Flu A&B, Covid) Anterior Nasal Swab     Status: Abnormal   Collection Time: 12/05/24  3:44 AM   Specimen: Anterior Nasal Swab  Result Value Ref Range Status   SARS Coronavirus 2 by RT PCR NEGATIVE NEGATIVE Final    Comment: (NOTE) SARS-CoV-2 target nucleic acids are NOT DETECTED.  The SARS-CoV-2 RNA is generally detectable in upper respiratory specimens during the acute phase of infection. The lowest concentration of SARS-CoV-2 viral copies this assay can detect is 138 copies/mL. A negative result does not preclude SARS-Cov-2 infection and should not be used as the sole basis for treatment or other patient management decisions. A negative result may occur with  improper specimen collection/handling, submission of specimen other than nasopharyngeal swab, presence of viral mutation(s) within the areas targeted by this assay, and  inadequate number of viral copies(<138 copies/mL). A negative result must be combined with clinical observations, patient history, and epidemiological information. The expected result is Negative.  Fact Sheet for Patients:  bloggercourse.com  Fact Sheet for Healthcare Providers:  seriousbroker.it  This test is no t yet approved or cleared by the [place]  FDA and  has been authorized for detection and/or diagnosis of SARS-CoV-2 by FDA under an Emergency Use Authorization (EUA). This EUA will remain  in effect (meaning this test can be used) for the duration of the COVID-19 declaration under Section 564(b)(1) of the Act, 21 U.S.C.section 360bbb-3(b)(1), unless the authorization is terminated  or revoked sooner.       Influenza A by PCR POSITIVE (A) NEGATIVE Final   Influenza B by PCR NEGATIVE NEGATIVE Final    Comment: (NOTE) The Xpert Xpress SARS-CoV-2/FLU/RSV plus assay is intended as an aid in the diagnosis of influenza from Nasopharyngeal swab specimens and should not be used as a sole basis for treatment. Nasal washings and aspirates are unacceptable for Xpert Xpress SARS-CoV-2/FLU/RSV testing.  Fact Sheet for Patients: bloggercourse.com  Fact Sheet for Healthcare Providers: seriousbroker.it  This test is not yet approved or cleared by the [place]  FDA and has been authorized for detection and/or diagnosis of SARS-CoV-2 by FDA under an Emergency Use Authorization (EUA). This EUA will remain in effect (meaning this test  can be used) for the duration of the COVID-19 declaration under Section 564(b)(1) of the Act, 21 U.S.C. section 360bbb-3(b)(1), unless the authorization is terminated or revoked.     Resp Syncytial Virus by PCR NEGATIVE NEGATIVE Final    Comment: (NOTE) Fact Sheet for Patients: bloggercourse.com  Fact Sheet for  Healthcare Providers: seriousbroker.it  This test is not yet approved or cleared by the [place]  FDA and has been authorized for detection and/or diagnosis of SARS-CoV-2 by FDA under an Emergency Use Authorization (EUA). This EUA will remain in effect (meaning this test can be used) for the duration of the COVID-19 declaration under Section 564(b)(1) of the Act, 21 U.S.C. section 360bbb-3(b)(1), unless the authorization is terminated or revoked.  Performed at [facility], [address] [place], [place] [address]   Blood culture (routine x 2)     Status: Abnormal   Collection Time: 12/05/24  4:05 AM   Specimen: BLOOD RIGHT WRIST  Result Value Ref Range Status   Specimen Description   Final    BLOOD RIGHT WRIST Performed at [facility], [address] [place], [place] [address]    Special Requests   Final    BOTTLES DRAWN AEROBIC AND ANAEROBIC Blood Culture results may not be optimal due to an inadequate volume of blood received in culture bottles Performed at [facility], [address] [place], [place] [address]    Culture  Setup Time   Final    GRAM POSITIVE COCCI IN PAIRS IN BOTH AEROBIC AND ANAEROBIC BOTTLES CRITICAL RESULT CALLED TO, READ BACK BY AND VERIFIED WITH: PHARMD M. BELL [id number] @ 2322 FH Performed at [facility], [address], [place], [place] [address]    Culture STREPTOCOCCUS PNEUMONIAE (A)  Final   Report Status 12/07/2024 FINAL  Final   Organism ID, Bacteria STREPTOCOCCUS PNEUMONIAE  Final      Susceptibility   Streptococcus pneumoniae - MIC*    ERYTHROMYCIN <=0.12 SENSITIVE Sensitive     LEVOFLOXACIN  0.5 SENSITIVE Sensitive     VANCOMYCIN  0.25 SENSITIVE Sensitive     PENICILLIN (meningitis) 0.25 RESISTANT Resistant     PENO - penicillin 0.25      PENICILLIN (non-meningitis) 0.25 SENSITIVE Sensitive     PENICILLIN (oral) 0.25 INTERMEDIATE Intermediate     CEFTRIAXONE   (non-meningitis) 0.25 SENSITIVE Sensitive     CEFTRIAXONE  (meningitis) 0.25 SENSITIVE Sensitive     * STREPTOCOCCUS PNEUMONIAE  Blood Culture ID Panel (Reflexed)     Status: Abnormal   Collection Time: 12/05/24  4:05 AM  Result Value Ref Range Status   Enterococcus faecalis NOT DETECTED NOT DETECTED Final   Enterococcus Faecium NOT DETECTED NOT DETECTED Final   Listeria monocytogenes NOT DETECTED NOT DETECTED Final   Staphylococcus species NOT DETECTED NOT DETECTED Final   Staphylococcus aureus (BCID) NOT DETECTED NOT DETECTED Final   Staphylococcus epidermidis NOT DETECTED NOT DETECTED Final   Staphylococcus lugdunensis NOT DETECTED NOT DETECTED Final   Streptococcus species DETECTED (A) NOT DETECTED Final    Comment: CRITICAL RESULT CALLED TO, READ BACK BY AND VERIFIED WITH: PHARMD M. BELL [id number] @ 2322 FH    Streptococcus agalactiae NOT DETECTED NOT DETECTED Final   Streptococcus pneumoniae DETECTED (A) NOT DETECTED Final    Comment: CRITICAL RESULT CALLED TO, READ BACK BY AND VERIFIED WITH: PHARMD M. BELL [id number] @ 2322 FH    Streptococcus pyogenes NOT DETECTED NOT DETECTED Final   A.calcoaceticus-baumannii NOT DETECTED NOT DETECTED Final  Bacteroides fragilis NOT DETECTED NOT DETECTED Final   Enterobacterales NOT DETECTED NOT DETECTED Final   Enterobacter cloacae complex NOT DETECTED NOT DETECTED Final   Escherichia coli NOT DETECTED NOT DETECTED Final   Klebsiella aerogenes NOT DETECTED NOT DETECTED Final   Klebsiella oxytoca NOT DETECTED NOT DETECTED Final   Klebsiella pneumoniae NOT DETECTED NOT DETECTED Final   Proteus species NOT DETECTED NOT DETECTED Final   Salmonella species NOT DETECTED NOT DETECTED Final   Serratia marcescens NOT DETECTED NOT DETECTED Final   Haemophilus influenzae NOT DETECTED NOT DETECTED Final   Neisseria meningitidis NOT DETECTED NOT DETECTED Final   Pseudomonas aeruginosa NOT DETECTED NOT DETECTED Final   Stenotrophomonas maltophilia NOT  DETECTED NOT DETECTED Final   Candida albicans NOT DETECTED NOT DETECTED Final   Candida auris NOT DETECTED NOT DETECTED Final   Candida glabrata NOT DETECTED NOT DETECTED Final   Candida krusei NOT DETECTED NOT DETECTED Final   Candida parapsilosis NOT DETECTED NOT DETECTED Final   Candida tropicalis NOT DETECTED NOT DETECTED Final   Cryptococcus neoformans/gattii NOT DETECTED NOT DETECTED Final    Comment: Performed at [facility], [address] [place], [place] [address]  Blood culture (routine x 2)     Status: None   Collection Time: 12/05/24  3:13 PM   Specimen: BLOOD RIGHT ARM  Result Value Ref Range Status   Specimen Description   Final    BLOOD RIGHT ARM Performed at [facility], [address] [place], [place] [address]    Special Requests   Final    BOTTLES DRAWN AEROBIC ONLY Blood Culture results may not be optimal due to an inadequate volume of blood received in culture bottles Performed at [facility], [address], [place], [place] [address]    Culture   Final    NO GROWTH 5 DAYS Performed at [facility], [address] [place], [place] [address]    Report Status 12/10/2024 FINAL  Final  Culture, blood (Routine X 2) w Reflex to ID Panel     Status: None (Preliminary result)   Collection Time: 12/06/24  6:57 PM   Specimen: BLOOD RIGHT ARM  Result Value Ref Range Status   Specimen Description BLOOD RIGHT ARM  Final   Special Requests AEROBIC BOTTLE ONLY Blood Culture adequate volume  Final   Culture   Final    NO GROWTH 4 DAYS Performed at [facility], [address] [place], [place] [address]    Report Status PENDING  Incomplete  Culture, blood (Routine X 2) w Reflex to ID Panel     Status: None (Preliminary result)   Collection Time: 12/06/24  6:57 PM   Specimen: BLOOD RIGHT ARM  Result Value Ref Range Status   Specimen Description BLOOD RIGHT ARM  Final   Special Requests AEROBIC BOTTLE ONLY Blood Culture  adequate volume  Final   Culture   Final    NO GROWTH 4 DAYS Performed at [facility], [address], [place], [place] [address]    Report Status PENDING  Incomplete    Procedures/Studies: US  EKG SITE RITE Result Date: 12/10/2024 If Site Rite image not attached, placement could not be confirmed due to current cardiac rhythm.  ECHO TEE Result Date: 12/08/2024    TRANSESOPHOGEAL ECHO REPORT   Patient Name:   NANETTE WIRSING Date of Exam: 12/08/2024 Medical Rec #:  [id number]       Height:  62.0 in Accession #:    [id number]      Weight:       184.5 lb Date of Birth:  [birth year]       BSA:          1.847 m Patient Age:    20 years        BP:           124/77 mmHg Patient Gender: F               HR:           76 bpm. Exam Location:  Inpatient Procedure: Transesophageal Echo, 3D Echo, Color Doppler, Cardiac Doppler and            Saline Contrast Bubble Study (Both Spectral and Color Flow Doppler            were utilized during procedure). Indications:     Endocarditis  History:         Patient has prior history of Echocardiogram examinations, most                  recent 12/07/2024. CHF; Arrythmias:Atrial Fibrillation.  Sonographer:     Philomena Daring Referring Phys:  [id number] MAHESH A SANTO Diagnosing Phys: Stanly Santo MD PROCEDURE: After discussion of the risks and benefits of a TEE, an informed consent was obtained from the patient. The transesophogeal probe was passed without difficulty through the esophogus of the patient. Imaged were obtained with the patient in a left lateral decubitus position. Sedation performed by different physician. The patient was monitored while under deep sedation. Anesthestetic sedation was provided intravenously by Anesthesiology: 50mg  of Propofol , 100mg  of Lidocaine . The patient developed no complications during the procedure.  IMPRESSIONS  1. Left ventricular ejection fraction, by estimation, is 20 to 25%. The left ventricle has severely decreased  function.  2. Right ventricular systolic function is moderately reduced. The right ventricular size is not well visualized.  3. Heavy left atrial smoke. No left atrial/left atrial appendage thrombus was detected. The LAA emptying velocity was 18 cm/s.  4. 25mm Magna Ease Mitral Pericardial Bioprosthesis without evidence of vegetation. The mitral valve has been repaired/replaced. No evidence of mitral valve regurgitation. No evidence of mitral stenosis.  5. Small (<1 cm) mobile linear echodensity send predominantly on the ventricular side of the aortic valve. In the setting of bacteremia, this can be seen in endocarditis. The aortic valve is tricuspid. Aortic valve regurgitation is not visualized. No aortic stenosis is present.  6. Mildly dilated pulmonary artery.  7. Agitated saline contrast bubble study was positive with shunting observed after >6 cardiac cycles suggestive of intrapulmonary shunting.  8. 3D performed of the mitral valve and demonstrates Sutures ntoed, but not IE seen on 3D MV. FINDINGS  Left Ventricle: Left ventricular ejection fraction, by estimation, is 20 to 25%. The left ventricle has severely decreased function. The left ventricular internal cavity size was normal in size. Right Ventricle: The right ventricular size is not well visualized. Right vetricular wall thickness was not well visualized. Right ventricular systolic function is moderately reduced. Left Atrium: Heavy left atrial smoke. Left atrial size was normal in size. No left atrial/left atrial appendage thrombus was detected. The LAA emptying velocity was 18 cm/s. Right Atrium: Right atrial size was normal in size. Pericardium: There is no evidence of pericardial effusion. Mitral Valve: 25mm Magna Ease Mitral Pericardial Bioprosthesis without evidence of vegetation. The mitral valve has been repaired/replaced. No evidence of mitral valve regurgitation.  There is a 25 mm Magna Ease bioprosthetic valve present in the mitral position. No  evidence of mitral valve stenosis. Tricuspid Valve: The tricuspid valve is normal in structure. Tricuspid valve regurgitation is mild . No evidence of tricuspid stenosis. Aortic Valve: Small (<1 cm) mobile linear echodensity send predominantly on the ventricular side of the aortic valve. In the setting of bacteremia, this can be seen in endocarditis. The aortic valve is tricuspid. Aortic valve regurgitation is not visualized. No aortic stenosis is present. Pulmonic Valve: The pulmonic valve was normal in structure. Pulmonic valve regurgitation is not visualized. No evidence of pulmonic stenosis. Aorta: The aortic root, ascending aorta, aortic arch and descending aorta are all structurally normal, with no evidence of dilitation or obstruction. Pulmonary Artery: The pulmonary artery is mildly dilated. IAS/Shunts: No atrial level shunt detected by color flow Doppler. Agitated saline contrast was given intravenously to evaluate for intracardiac shunting. Agitated saline contrast bubble study was positive with shunting observed after >6 cardiac cycles suggestive of intrapulmonary shunting. Additional Comments: Spectral Doppler performed. Stanly Leavens MD Electronically signed by Stanly Leavens MD Signature Date/Time: 12/08/2024/4:02:55 PM    Final    EP STUDY Result Date: 12/08/2024 See surgical note for result.  ECHOCARDIOGRAM COMPLETE Result Date: 12/07/2024    ECHOCARDIOGRAM REPORT   Patient Name:   THARA SEARING Date of Exam: 12/07/2024 Medical Rec #:  [id number]       Height:       62.0 in Accession #:    [id number]      Weight:       168.0 lb Date of Birth:  [birth year]       BSA:          1.775 m Patient Age:    68 years        BP:           107/74 mmHg Patient Gender: F               HR:           85 bpm. Exam Location:  Inpatient Procedure: 2D Echo, Cardiac Doppler, Color Doppler and Intracardiac            Opacification Agent (Both Spectral and Color Flow Doppler were            utilized during  procedure). Indications:    Endocarditis I38  History:        Patient has prior history of Echocardiogram examinations, most                 recent 09/17/2024. Mitral Valve Disease.  Sonographer:    Tinnie Gosling RDCS Referring Phys: [id number] GEORGANNA ARCHER IMPRESSIONS  1. Left ventricular ejection fraction, by estimation, is <20%. Left ventricular ejection fraction by 2D MOD biplane is 10.6 %. The left ventricle has severely decreased function. The left ventricle demonstrates global hypokinesis. The left ventricular internal cavity size was severely dilated. Diastology is indeterminate in the setting of MV replacement.  2. Right ventricular systolic function is severely reduced. The right ventricular size is moderately enlarged. There is normal pulmonary artery systolic pressure.  3. Left atrial size was mildly dilated.  4. The mitral valve has been replaced. There is a 25 mm Magna Ease bioprosthetic valve present in the mitral position. The bioprosthesis appears to be well seated. On the left ventricular side of the mitral valve there is an independently mobile echodensity measuring 0.31 cm x 0.97 cm concerning for possible infective endocarditis. This was  not visible on the echocardiogram dated 09/17/24. There is trivial mitral regurgitation. The gradients through the mitral valve are within normal limits.  5. The aortic valve is tricuspid. Aortic valve regurgitation is not visualized. No aortic stenosis is present.  6. The inferior vena cava is dilated in size with >50% respiratory variability, suggesting right atrial pressure of 8 mmHg. Comparison(s): Changes from prior study are noted. Possible endocarditis, severe RV systolic dysfunction. Conclusion(s)/Recommendation(s): Recommend TEE for better assessment of the MV given concern for endocarditis. FINDINGS  Left Ventricle: Left ventricular ejection fraction, by estimation, is <20%. Left ventricular ejection fraction by 2D MOD biplane is 10.6 %. The left  ventricle has severely decreased function. The left ventricle demonstrates global hypokinesis. The left ventricular internal cavity size was severely dilated. There is no left ventricular hypertrophy. Diastology is indeterminate in the setting of MV replacement. Right Ventricle: The right ventricular size is moderately enlarged. No increase in right ventricular wall thickness. Right ventricular systolic function is severely reduced. There is normal pulmonary artery systolic pressure. The tricuspid regurgitant velocity is 2.20 m/s, and with an assumed right atrial pressure of 8 mmHg, the estimated right ventricular systolic pressure is 27.4 mmHg. Left Atrium: Left atrial size was mildly dilated. Right Atrium: Right atrial size was normal in size. Pericardium: There is no evidence of pericardial effusion. Mitral Valve: The mitral valve has been replaced. There is a 25 mm Magna Ease bioprosthetic valve present in the mitral position. The bioprosthesis appears to be well seated. On the left ventricular side of the mitral valve there is an independently mobile echodensity measuring 0.31 cm x 0.97 cm concerning for possible infective endocarditis. This was not visible on the echocardiogram dated 09/17/24. There is trivial mitral regurgitation. The gradients through the mitral valve are within normal limits. MV peak gradient, 14.9 mmHg. The mean mitral valve gradient is 10.0 mmHg. Tricuspid Valve: The tricuspid valve is normal in structure. Tricuspid valve regurgitation is mild . No evidence of tricuspid stenosis. Aortic Valve: The aortic valve is tricuspid. Aortic valve regurgitation is not visualized. No aortic stenosis is present. There is no evidence of aortic valve vegetation. Pulmonic Valve: The pulmonic valve was normal in structure. Pulmonic valve regurgitation is not visualized. No evidence of pulmonic stenosis. There is no evidence of pulmonic valve vegetation. Aorta: The aortic root and ascending aorta are  structurally normal, with no evidence of dilitation. Venous: The inferior vena cava is dilated in size with greater than 50% respiratory variability, suggesting right atrial pressure of 8 mmHg. IAS/Shunts: No atrial level shunt detected by color flow Doppler. Additional Comments: A device lead is visualized.  LEFT VENTRICLE PLAX 2D                        Biplane EF (MOD) LVIDd:         5.80 cm         LV Biplane EF:   Left LVIDs:         5.60 cm                          ventricular LV PW:         1.00 cm                          ejection LV IVS:        1.00 cm  fraction by LVOT diam:     2.00 cm                          2D MOD LV SV:         47                               biplane is LV SV Index:   27                               10.6 %. LVOT Area:     3.14 cm LV IVRT:       140 msec        Diastology                                LV e' lateral:   4.68 cm/s                                LV E/e' lateral: 31.4 LV Volumes (MOD) LV vol d, MOD    204.0 ml A2C: LV vol d, MOD    211.0 ml A4C: LV vol s, MOD    196.0 ml A2C: LV vol s, MOD    176.0 ml A4C: LV SV MOD A2C:   8.0 ml LV SV MOD A4C:   211.0 ml LV SV MOD BP:    22.4 ml RIGHT VENTRICLE            IVC RV S prime:     8.38 cm/s  IVC diam: 2.10 cm TAPSE (M-mode): 0.7 cm LEFT ATRIUM             Index        RIGHT ATRIUM           Index LA Vol (A2C):   79.0 ml 44.50 ml/m  RA Area:     18.90 cm LA Vol (A4C):   48.0 ml 27.04 ml/m  RA Volume:   51.80 ml  29.18 ml/m LA Biplane Vol: 63.9 ml 36.00 ml/m  AORTIC VALVE LVOT Vmax:   89.40 cm/s LVOT Vmean:  55.900 cm/s LVOT VTI:    0.151 m  AORTA Ao Root diam: 2.90 cm Ao Asc diam:  3.20 cm MITRAL VALVE                TRICUSPID VALVE MV Area (PHT): 3.61 cm     TR Peak grad:   19.4 mmHg MV Area VTI:   0.92 cm     TR Vmax:        220.00 cm/s MV Peak grad:  14.9 mmHg MV Mean grad:  10.0 mmHg    SHUNTS MV Vmax:       1.93 m/s     Systemic VTI:  0.15 m MV Vmean:      151.0 cm/s   Systemic Diam: 2.00 cm  MV E velocity: 147.00 cm/s MV A velocity: 159.00 cm/s MV E/A ratio:  0.92 Georganna Archer Electronically signed by Georganna Archer Signature Date/Time: 12/07/2024/1:57:33 PM    Final    DG Chest Portable 1 View Result Date: 12/05/2024 EXAM: 1 VIEW(S) XRAY OF THE CHEST 12/05/2024 03:31:00 AM COMPARISON: 09/15/2024 CLINICAL HISTORY: sob FINDINGS: LUNGS AND PLEURA: Perihilar and  interstitial opacities. Possible left pleural effusion. No pneumothorax. HEART AND MEDIASTINUM: Cardiomegaly. Left subclavian cardiac rhythm device noted. BONES AND SOFT TISSUES: Median sternotomy noted. No acute osseous abnormality. IMPRESSION: 1. Cardiomegaly with left subclavian cardiac rhythm device, similar to 09/15/2024. 2. Perihilar and interstitial opacities, which may reflect pulmonary edema or pneumonia. 3. Possible left pleural effusion. Electronically signed by: Evalene Coho MD 12/05/2024 04:08 AM EST RP Workstation: [id number]    Labs: BNP (last 3 results) Recent Labs    07/09/24 1443 09/15/24 1455 10/02/24 1442  BNP 1,085.7* 2,832.1* 872.4*   Basic Metabolic Panel: Recent Labs  Lab 12/07/24 0434 12/08/24 0404 12/09/24 0416 12/10/24 0413 12/11/24 0444  NA 139 140 140 139 139  K 4.1 3.6 4.2 4.1 4.1  CL 103 103 105 103 102  CO2 24 25 23 25 26   GLUCOSE 113* 143* 164* 204* 164*  BUN 36* 35* 30* 29* 30*  CREATININE 1.47* 1.42* 1.44* 1.63* 1.39*  CALCIUM  8.7* 8.5* 8.5* 8.8* 8.7*  MG 2.5* 2.4 2.3  --   --    Liver Function Tests: No results for input(s): AST, ALT, ALKPHOS, BILITOT, PROT, ALBUMIN  in the last 168 hours. No results for input(s): LIPASE, AMYLASE in the last 168 hours. No results for input(s): AMMONIA in the last 168 hours. CBC: Recent Labs  Lab 12/06/24 0426 12/07/24 0434 12/08/24 0404 12/09/24 0416 12/10/24 0413  WBC 15.9* 10.3 7.1 7.4 7.7  NEUTROABS  --  8.3* 5.3 5.3 5.7  HGB 11.9* 12.3 12.4 12.1 11.9*  HCT 38.7 39.9 40.3 38.4 39.5  MCV 73.7* 73.3* 73.1*  73.4* 74.4*  PLT 160 191 193 187 206   CBG: Recent Labs  Lab 12/10/24 0737 12/10/24 1137 12/10/24 1615 12/10/24 1958 12/11/24 0744  GLUCAP 147* 173* 205* 168* 110*   Hgb A1c No results for input(s): HGBA1C in the last 72 hours. Anemia work up No results for input(s): VITAMINB12, FOLATE, FERRITIN, TIBC, IRON, RETICCTPCT in the last 72 hours.  Cardiac Enzymes: No results for input(s): CKTOTAL, CKMB, CKMBINDEX, TROPONINI in the last 168 hours. BNP: Invalid input(s): POCBNP D-Dimer No results for input(s): DDIMER in the last 72 hours. Lipid Profile No results for input(s): CHOL, HDL, LDLCALC, TRIG, CHOLHDL, LDLDIRECT in the last 72 hours. Thyroid  function studies No results for input(s): TSH, T4TOTAL, T3FREE, THYROIDAB in the last 72 hours.  Invalid input(s): FREET3 Urinalysis    Component Value Date/Time   COLORURINE YELLOW 04/15/2017 1229   APPEARANCEUR HAZY (A) 04/15/2017 1229   LABSPEC 1.010 04/15/2017 1229   PHURINE 5.0 04/15/2017 1229   GLUCOSEU NEGATIVE 04/15/2017 1229   HGBUR SMALL (A) 04/15/2017 1229   BILIRUBINUR NEGATIVE 04/15/2017 1229   KETONESUR NEGATIVE 04/15/2017 1229   PROTEINUR NEGATIVE 04/15/2017 1229   NITRITE NEGATIVE 04/15/2017 1229   LEUKOCYTESUR NEGATIVE 04/15/2017 1229   Sepsis Labs Recent Labs  Lab 12/07/24 0434 12/08/24 0404 12/09/24 0416 12/10/24 0413  WBC 10.3 7.1 7.4 7.7   Microbiology Recent Results (from the past 240 hours)  Resp panel by RT-PCR (RSV, Flu A&B, Covid) Anterior Nasal Swab     Status: Abnormal   Collection Time: 12/05/24  3:44 AM   Specimen: Anterior Nasal Swab  Result Value Ref Range Status   SARS Coronavirus 2 by RT PCR NEGATIVE NEGATIVE Final    Comment: (NOTE) SARS-CoV-2 target nucleic acids are NOT DETECTED.  The SARS-CoV-2 RNA is generally detectable in upper respiratory specimens during the acute phase of infection. The lowest concentration of SARS-CoV-2  viral copies this  assay can detect is 138 copies/mL. A negative result does not preclude SARS-Cov-2 infection and should not be used as the sole basis for treatment or other patient management decisions. A negative result may occur with  improper specimen collection/handling, submission of specimen other than nasopharyngeal swab, presence of viral mutation(s) within the areas targeted by this assay, and inadequate number of viral copies(<138 copies/mL). A negative result must be combined with clinical observations, patient history, and epidemiological information. The expected result is Negative.  Fact Sheet for Patients:  bloggercourse.com  Fact Sheet for Healthcare Providers:  seriousbroker.it  This test is no t yet approved or cleared by the [place]  FDA and  has been authorized for detection and/or diagnosis of SARS-CoV-2 by FDA under an Emergency Use Authorization (EUA). This EUA will remain  in effect (meaning this test can be used) for the duration of the COVID-19 declaration under Section 564(b)(1) of the Act, 21 U.S.C.section 360bbb-3(b)(1), unless the authorization is terminated  or revoked sooner.       Influenza A by PCR POSITIVE (A) NEGATIVE Final   Influenza B by PCR NEGATIVE NEGATIVE Final    Comment: (NOTE) The Xpert Xpress SARS-CoV-2/FLU/RSV plus assay is intended as an aid in the diagnosis of influenza from Nasopharyngeal swab specimens and should not be used as a sole basis for treatment. Nasal washings and aspirates are unacceptable for Xpert Xpress SARS-CoV-2/FLU/RSV testing.  Fact Sheet for Patients: bloggercourse.com  Fact Sheet for Healthcare Providers: seriousbroker.it  This test is not yet approved or cleared by the [place]  FDA and has been authorized for detection and/or diagnosis of SARS-CoV-2 by FDA under an Emergency Use Authorization  (EUA). This EUA will remain in effect (meaning this test can be used) for the duration of the COVID-19 declaration under Section 564(b)(1) of the Act, 21 U.S.C. section 360bbb-3(b)(1), unless the authorization is terminated or revoked.     Resp Syncytial Virus by PCR NEGATIVE NEGATIVE Final    Comment: (NOTE) Fact Sheet for Patients: bloggercourse.com  Fact Sheet for Healthcare Providers: seriousbroker.it  This test is not yet approved or cleared by the [place]  FDA and has been authorized for detection and/or diagnosis of SARS-CoV-2 by FDA under an Emergency Use Authorization (EUA). This EUA will remain in effect (meaning this test can be used) for the duration of the COVID-19 declaration under Section 564(b)(1) of the Act, 21 U.S.C. section 360bbb-3(b)(1), unless the authorization is terminated or revoked.  Performed at [facility], [address] [place], [place] [address]   Blood culture (routine x 2)     Status: Abnormal   Collection Time: 12/05/24  4:05 AM   Specimen: BLOOD RIGHT WRIST  Result Value Ref Range Status   Specimen Description   Final    BLOOD RIGHT WRIST Performed at [facility], [address] [place], [place] [address]    Special Requests   Final    BOTTLES DRAWN AEROBIC AND ANAEROBIC Blood Culture results may not be optimal due to an inadequate volume of blood received in culture bottles Performed at [facility], [address], [place], [place] [address]    Culture  Setup Time   Final    GRAM POSITIVE COCCI IN PAIRS IN BOTH AEROBIC AND ANAEROBIC BOTTLES CRITICAL RESULT CALLED TO, READ BACK BY AND VERIFIED WITH: PHARMD M. BELL [id number] @ 2322 FH Performed at [facility], [address] [place], [place] [address]    Culture STREPTOCOCCUS PNEUMONIAE (  A)  Final   Report Status 12/07/2024 FINAL  Final   Organism ID, Bacteria STREPTOCOCCUS  PNEUMONIAE  Final      Susceptibility   Streptococcus pneumoniae - MIC*    ERYTHROMYCIN <=0.12 SENSITIVE Sensitive     LEVOFLOXACIN  0.5 SENSITIVE Sensitive     VANCOMYCIN  0.25 SENSITIVE Sensitive     PENICILLIN (meningitis) 0.25 RESISTANT Resistant     PENO - penicillin 0.25      PENICILLIN (non-meningitis) 0.25 SENSITIVE Sensitive     PENICILLIN (oral) 0.25 INTERMEDIATE Intermediate     CEFTRIAXONE  (non-meningitis) 0.25 SENSITIVE Sensitive     CEFTRIAXONE  (meningitis) 0.25 SENSITIVE Sensitive     * STREPTOCOCCUS PNEUMONIAE  Blood Culture ID Panel (Reflexed)     Status: Abnormal   Collection Time: 12/05/24  4:05 AM  Result Value Ref Range Status   Enterococcus faecalis NOT DETECTED NOT DETECTED Final   Enterococcus Faecium NOT DETECTED NOT DETECTED Final   Listeria monocytogenes NOT DETECTED NOT DETECTED Final   Staphylococcus species NOT DETECTED NOT DETECTED Final   Staphylococcus aureus (BCID) NOT DETECTED NOT DETECTED Final   Staphylococcus epidermidis NOT DETECTED NOT DETECTED Final   Staphylococcus lugdunensis NOT DETECTED NOT DETECTED Final   Streptococcus species DETECTED (A) NOT DETECTED Final    Comment: CRITICAL RESULT CALLED TO, READ BACK BY AND VERIFIED WITH: PHARMD M. BELL [id number] @ 2322 FH    Streptococcus agalactiae NOT DETECTED NOT DETECTED Final   Streptococcus pneumoniae DETECTED (A) NOT DETECTED Final    Comment: CRITICAL RESULT CALLED TO, READ BACK BY AND VERIFIED WITH: PHARMD M. BELL [id number] @ 2322 FH    Streptococcus pyogenes NOT DETECTED NOT DETECTED Final   A.calcoaceticus-baumannii NOT DETECTED NOT DETECTED Final   Bacteroides fragilis NOT DETECTED NOT DETECTED Final   Enterobacterales NOT DETECTED NOT DETECTED Final   Enterobacter cloacae complex NOT DETECTED NOT DETECTED Final   Escherichia coli NOT DETECTED NOT DETECTED Final   Klebsiella aerogenes NOT DETECTED NOT DETECTED Final   Klebsiella oxytoca NOT DETECTED NOT DETECTED Final   Klebsiella  pneumoniae NOT DETECTED NOT DETECTED Final   Proteus species NOT DETECTED NOT DETECTED Final   Salmonella species NOT DETECTED NOT DETECTED Final   Serratia marcescens NOT DETECTED NOT DETECTED Final   Haemophilus influenzae NOT DETECTED NOT DETECTED Final   Neisseria meningitidis NOT DETECTED NOT DETECTED Final   Pseudomonas aeruginosa NOT DETECTED NOT DETECTED Final   Stenotrophomonas maltophilia NOT DETECTED NOT DETECTED Final   Candida albicans NOT DETECTED NOT DETECTED Final   Candida auris NOT DETECTED NOT DETECTED Final   Candida glabrata NOT DETECTED NOT DETECTED Final   Candida krusei NOT DETECTED NOT DETECTED Final   Candida parapsilosis NOT DETECTED NOT DETECTED Final   Candida tropicalis NOT DETECTED NOT DETECTED Final   Cryptococcus neoformans/gattii NOT DETECTED NOT DETECTED Final    Comment: Performed at [facility], [address], [place], [place] [address]  Blood culture (routine x 2)     Status: None   Collection Time: 12/05/24  3:13 PM   Specimen: BLOOD RIGHT ARM  Result Value Ref Range Status   Specimen Description   Final    BLOOD RIGHT ARM Performed at [facility], [address] [place], [place] [address]    Special Requests   Final    BOTTLES DRAWN AEROBIC ONLY Blood Culture results may not be optimal due to an inadequate volume of blood received in culture bottles Performed at [facility], [address] [place], [place] [address]    Culture   Final    NO GROWTH 5 DAYS Performed at [facility], [address] [place], [place] [address]    Report Status 12/10/2024 FINAL  Final  Culture, blood (Routine X 2) w Reflex to ID Panel     Status: None (Preliminary result)   Collection Time: 12/06/24  6:57 PM   Specimen: BLOOD RIGHT ARM  Result Value Ref Range Status   Specimen Description BLOOD RIGHT ARM  Final   Special Requests AEROBIC BOTTLE ONLY Blood Culture adequate volume  Final   Culture   Final    NO GROWTH  4 DAYS Performed at [facility], [address] [place], [place] [address]    Report Status PENDING  Incomplete  Culture, blood (Routine X 2) w Reflex to ID Panel     Status: None (Preliminary result)   Collection Time: 12/06/24  6:57 PM   Specimen: BLOOD RIGHT ARM  Result Value Ref Range Status   Specimen Description BLOOD RIGHT ARM  Final   Special Requests AEROBIC BOTTLE ONLY Blood Culture adequate volume  Final   Culture   Final    NO GROWTH 4 DAYS Performed at [facility], [address], [place], [place] [address]    Report Status PENDING  Incomplete   Time coordinating discharge:  35 minutes  SIGNED: Mennie LAMY, MD  [facility] 12/11/2024, 10:46 AM  If 7PM-7AM, please contact night-coverage [email or web address]       [1]  Allergies Allergen Reactions   Keflex  [Cephalexin ] Rash    Skin on feet peeled.   Asa [Aspirin ]     325 mg ----heartburn Pt can take 81 mg   Enalapril Cough   Lisinopril  Cough

## 2024-12-11 NOTE — Progress Notes (Signed)
 Spoke with Pam from [employer] [facility] IV therapy. Stated that she spoke with ID and that the patient is to receive dose of IV abx prior to discharge and that the patient will administer her home IV abx starting tomorrow. Primary RN updated

## 2024-12-11 NOTE — TOC Initial Note (Signed)
 Transition of Care [facility]) - Initial/Assessment Note    Patient Details  Name: Rachael Davis MRN: [id number] Date of Birth: 1956/04/19  Transition of Care [facility]) CM/SW Contact:    Tawni CHRISTELLA Eva, LCSW Phone Number: 12/11/2024, 8:11 AM  Clinical Narrative:                   Pt rec for home IV abx , Pam with [facility] is following and teaching was provided. [facility] home infusion will provided HHRN as no other HH agency accepted pt. No futher ICM needs, ICM sign off.   Expected Discharge Plan: Home w Home Health Services Barriers to Discharge: Barriers Resolved   Patient Goals and CMS Choice Patient states their goals for this hospitalization and ongoing recovery are:: retrun home with home health          Expected Discharge Plan and Services       Living arrangements for the past 2 months: Single Family Home                                      Prior Living Arrangements/Services Living arrangements for the past 2 months: Single Family Home Lives with:: Self, Spouse                   Activities of Daily Living   ADL Screening (condition at time of admission) Independently performs ADLs?: Yes (appropriate for developmental age) Is the patient deaf or have difficulty hearing?: No Does the patient have difficulty seeing, even when wearing glasses/contacts?: No Does the patient have difficulty concentrating, remembering, or making decisions?: No  Permission Sought/Granted                  Emotional Assessment              Admission diagnosis:  Acute CHF (congestive heart failure) (HCC) [I50.9] Acute systolic (congestive) heart failure (HCC) [I50.21] Community acquired pneumonia, unspecified laterality [J18.9] Acute on chronic congestive heart failure, unspecified heart failure type (HCC) [I50.9] Patient Active Problem List   Diagnosis Date Noted   Endocarditis 12/08/2024   Streptococcal bacteremia 12/08/2024   Bacteremia due to  Streptococcus pneumoniae 12/06/2024   Community acquired pneumonia 12/06/2024   Influenza A with pneumonia 12/06/2024   Severe sepsis (HCC) 12/06/2024   S/P mitral valve replacement with bioprosthetic valve 12/06/2024   Type 2 diabetes mellitus with stage 3a chronic kidney disease, without long-term current use of insulin  (HCC) 12/06/2024   Acute systolic (congestive) heart failure (HCC) 12/05/2024   Chronic kidney disease, stage 3b (HCC) 09/20/2024   Acute CHF (congestive heart failure) (HCC) 09/16/2024   Acute exacerbation of congestive heart failure (HCC) 12/17/2023   Acute respiratory failure with hypoxia (HCC) 12/17/2023   VT (ventricular tachycardia) (HCC)sustained 10/02/2022   RBBB 10/02/2022   PVC's (premature ventricular contractions) 10/02/2022   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 10/02/2022   Chronic systolic CHF (congestive heart failure) (HCC) 05/30/2022   Class 1 obesity 04/05/2022   Chronic kidney disease, stage 3a (HCC) 04/04/2022   Acute on chronic systolic CHF (congestive heart failure) (HCC) 04/03/2022   Hyperkalemia 02/09/2021   Palpitations 01/20/2021   Ischemic cardiomyopathy 01/20/2021   1st degree AV block 01/20/2021   Multiple thyroid  nodules 08/25/2019   Paroxysmal atrial fibrillation (HCC) 07/03/2017   Systolic and diastolic CHF, acute on chronic (HCC) 05/23/2017   Volume overload 05/08/2017   Long  term (current) use of anticoagulants [Z79.01] 04/23/2017   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   Essential hypertension 04/04/2017   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) 04/04/2017   Type 2 diabetes mellitus with hyperlipidemia (HCC) 04/04/2017   PCP:  Sun, Vyvyan, MD Pharmacy:   [facility] [phone] [place],  - [address] AT [facility] OF [address] [place] [address] Phone: [phone] Fax: [phone]  [facility] [place], [place] - [address] [place] [address] Phone: [phone] Fax: [phone]  [facility] [place], [place] - [address] [place] [address] Phone: [phone] Fax: [phone]     Social Drivers of Health (SDOH) Social History: SDOH Screenings   Food Insecurity: No Food Insecurity (12/05/2024)  Housing: Low Risk (12/05/2024)  Transportation Needs: No Transportation Needs (12/05/2024)  Utilities: Not At Risk (12/05/2024)  Alcohol Screen: Low Risk (04/06/2022)  Social Connections: Moderately Integrated (12/05/2024)  Recent Concern: Social Connections - Moderately Isolated (09/16/2024)  Tobacco Use: Low Risk (12/05/2024)   SDOH Interventions:     Readmission Risk Interventions    09/18/2024    9:51 AM 04/06/2022   11:40 AM  Readmission Risk Prevention Plan  Transportation Screening Complete Complete  PCP or Specialist Appt within 5-7 Days  Complete  Home Care Screening Complete Complete  Medication Review (RN CM) Complete Complete

## 2024-12-11 NOTE — Progress Notes (Signed)
 Discharge instructions reviewed with patient, verbalized understanding. All questions answered. All belongings accounted for.   Patient needs to receive IV abx before discharge.

## 2024-12-12 LAB — CULTURE, BLOOD (ROUTINE X 2)
Culture: NO GROWTH
Culture: NO GROWTH
Special Requests: ADEQUATE
Special Requests: ADEQUATE

## 2024-12-23 ENCOUNTER — Inpatient Hospital Stay: Payer: Self-pay | Admitting: Infectious Diseases

## 2024-12-31 ENCOUNTER — Ambulatory Visit: Payer: No Typology Code available for payment source

## 2024-12-31 ENCOUNTER — Other Ambulatory Visit: Payer: Self-pay

## 2024-12-31 ENCOUNTER — Telehealth: Payer: Self-pay

## 2024-12-31 ENCOUNTER — Ambulatory Visit (INDEPENDENT_AMBULATORY_CARE_PROVIDER_SITE_OTHER): Admitting: Internal Medicine

## 2024-12-31 VITALS — BP 130/85 | HR 96 | Temp 98.0°F | Wt 166.0 lb

## 2024-12-31 DIAGNOSIS — I330 Acute and subacute infective endocarditis: Secondary | ICD-10-CM

## 2024-12-31 DIAGNOSIS — N1831 Chronic kidney disease, stage 3a: Secondary | ICD-10-CM | POA: Diagnosis not present

## 2024-12-31 DIAGNOSIS — J09X1 Influenza due to identified novel influenza A virus with pneumonia: Secondary | ICD-10-CM

## 2024-12-31 DIAGNOSIS — I255 Ischemic cardiomyopathy: Secondary | ICD-10-CM

## 2024-12-31 LAB — CUP PACEART REMOTE DEVICE CHECK
Battery Remaining Longevity: 150 mo
Battery Remaining Percentage: 100 %
Brady Statistic RV Percent Paced: 0 %
Date Time Interrogation Session: 20260128021400
HighPow Impedance: 55 Ohm
Implantable Lead Connection Status: 753985
Implantable Lead Implant Date: 20230728
Implantable Lead Location: 753860
Implantable Lead Model: 138
Implantable Lead Serial Number: 303552
Implantable Pulse Generator Implant Date: 20230728
Lead Channel Impedance Value: 431 Ohm
Lead Channel Pacing Threshold Amplitude: 0.7 V
Lead Channel Pacing Threshold Pulse Width: 0.4 ms
Lead Channel Setting Pacing Amplitude: 2.5 V
Lead Channel Setting Pacing Pulse Width: 0.4 ms
Lead Channel Setting Sensing Sensitivity: 0.6 mV
Pulse Gen Serial Number: 217674

## 2024-12-31 NOTE — Telephone Encounter (Signed)
 Per Dr Luiz picc can be pulled after last dose. Community message sent to [employer] and orders confirmed by Pam.  Rachael Davis Code, RMA

## 2024-12-31 NOTE — Progress Notes (Signed)
 "      Patient ID: Rachael Davis, female   DOB: June 11, 1956, 69 y.o.   MRN: [id number]  HPI 69 year old female with prior history as below including A-fib on AC, CHF, DM, HTN, s/p bioprosthetic MVR, s/p VT s/p ICD admitted recently for influenza pneumonia complicated by streptococcal pneumoniae bacteremia. And subsequently found to have possible infective endocarditis with +TEE small aortic valve vegetation but no prosthetic mitral valve vegetation. No impact on lead on 12/08/2024. She was also evaluated by EP who felt that she didn't have to extract cardiac device, no surgery indications. She was discharged on ceftiraxone for 4 weeks with End of abtx therapy is on 01/03/2025. She has been tolerating her iv abtx through picc line without difficulty.  Reviewed recent labs, all cr and wbc and plt all withint normal range.  Outpatient Encounter Medications as of 12/31/2024  Medication Sig   acetaminophen  (TYLENOL ) 325 MG tablet Take 2 tablets (650 mg total) by mouth every 6 (six) hours as needed for mild pain or headache.   aspirin  EC 81 MG tablet Take 81 mg by mouth daily.   atorvastatin  (LIPITOR ) 40 MG tablet TAKE 1 TABLET BY MOUTH EVERY DAY   carvedilol  (COREG ) 12.5 MG tablet Take 1 tablet (12.5 mg total) by mouth 2 (two) times daily.   cefTRIAXone  (ROCEPHIN ) IVPB Inject 2 g into the vein daily for 24 days. Indication:  Strep PNA bacteremia + AV IE First Dose: yes Last Day of Therapy:  01/03/25 Labs - Once weekly:  CBC/D and BMP, Labs - Once weekly: ESR and CRP Method of administration: IV Push Method of administration may be changed at the discretion of home infusion pharmacist based upon assessment of the patient and/or caregiver's ability to self-administer the medication ordered.   dapagliflozin  propanediol (FARXIGA ) 10 MG TABS tablet Take 1 tablet (10 mg total) by mouth daily.   ENTRESTO  24-26 MG TAKE 1 TABLET BY MOUTH TWICE A DAY   glipiZIDE  (GLUCOTROL  XL) 10 MG 24 hr tablet Take 1 tablet (10  mg total) by mouth daily with breakfast.   Dextromethorphan -guaiFENesin  (ROBAFEN DM) 20-200 MG/20ML LIQD Take 5 mLs by mouth every 4 (four) hours as needed.   loratadine  (CLARITIN ) 10 MG tablet Take 1 tablet (10 mg total) by mouth daily.   [Paused] metFORMIN  (GLUCOPHAGE ) 1000 MG tablet Take 1,000 mg by mouth 2 (two) times daily with a meal.   pantoprazole  (PROTONIX ) 40 MG tablet Take 1 tablet (40 mg total) by mouth daily.   potassium chloride  SA (KLOR-CON  M) 20 MEQ tablet Take 1 tablet (20 mEq total) by mouth daily.   RYBELSUS 14 MG TABS Take 1 tablet by mouth every morning.   torsemide  (DEMADEX ) 20 MG tablet Take 2 tablets (40 mg total) by mouth daily.   warfarin (COUMADIN ) 5 MG tablet TAKE 1 TABLET BY MOUTH DAILY OR AS DIRECTED BY COUMADIN  CLINIC   No facility-administered encounter medications on file as of 12/31/2024.     Patient Active Problem List   Diagnosis Date Noted   Endocarditis 12/08/2024   Streptococcal bacteremia 12/08/2024   Bacteremia due to Streptococcus pneumoniae 12/06/2024   Community acquired pneumonia 12/06/2024   Influenza A with pneumonia 12/06/2024   Severe sepsis (HCC) 12/06/2024   S/P mitral valve replacement with bioprosthetic valve 12/06/2024   Type 2 diabetes mellitus with stage 3a chronic kidney disease, without long-term current use of insulin  (HCC) 12/06/2024   Acute systolic (congestive) heart failure (HCC) 12/05/2024   Chronic kidney disease, stage 3b (  HCC) 09/20/2024   Acute CHF (congestive heart failure) (HCC) 09/16/2024   Acute exacerbation of congestive heart failure (HCC) 12/17/2023   Acute respiratory failure with hypoxia (HCC) 12/17/2023   VT (ventricular tachycardia) (HCC)sustained 10/02/2022   RBBB 10/02/2022   PVC's (premature ventricular contractions) 10/02/2022   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 10/02/2022   Chronic systolic CHF (congestive heart failure) (HCC) 05/30/2022   Class 1 obesity 04/05/2022   Chronic  kidney disease, stage 3a (HCC) 04/04/2022   Acute on chronic systolic CHF (congestive heart failure) (HCC) 04/03/2022   Hyperkalemia 02/09/2021   Palpitations 01/20/2021   Ischemic cardiomyopathy 01/20/2021   1st degree AV block 01/20/2021   Multiple thyroid  nodules 08/25/2019   Paroxysmal atrial fibrillation (HCC) 07/03/2017   Systolic and diastolic CHF, acute on chronic (HCC) 05/23/2017   Volume overload 05/08/2017   Long term (current) use of anticoagulants [Z79.01] 04/23/2017   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   Essential hypertension 04/04/2017   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) 04/04/2017   Type 2 diabetes mellitus with hyperlipidemia (HCC) 04/04/2017     Health Maintenance Due  Topic Date Due   FOOT EXAM  Never done   OPHTHALMOLOGY EXAM  Never done   Diabetic kidney evaluation - Urine ACR  Never done   Hepatitis C Screening  Never done   DTaP/Tdap/Td (1 - Tdap) Never done   Pneumococcal Vaccine: 50+ Years (1 of 2 - PCV) Never done   Colonoscopy  Never done   Zoster Vaccines- Shingrix (1 of 2) Never done   COVID-19 Vaccine (1 - 2025-26 season) Never done     Review of Systems 1 episode of nausea with abtx recnetly, but overall improved. Physical Exam   BP 130/85   Pulse 96   Temp 98 F (36.7 C) (Oral)   Wt 166 lb (75.3 kg)   SpO2 99%   BMI 30.36 kg/m    Physical Exam  Constitutional:  oriented to person, place, and time. appears well-developed and well-nourished. No distress.  HENT: [place]/AT, PERRLA, no scleral icterus Mouth/Throat: Oropharynx is clear and moist. No oropharyngeal exudate.  Cardiovascular: Normal rate, regular rhythm and normal heart sounds. Exam reveals no gallop and no friction rub.  No murmur heard.  Pulmonary/Chest: Effort normal and breath sounds normal. No respiratory distress.  has no wheezes.  Neck = supple, no nuchal rigidity Ext: picc line is c/d/i Lymphadenopathy: no cervical adenopathy. No axillary  adenopathy Neurological: alert and oriented to person, place, and time.  Skin: Skin is warm and dry. No rash noted. No erythema.  Psychiatric: a normal mood and affect.  behavior is normal.    CBC Lab Results  Component Value Date   WBC 7.7 12/10/2024   RBC 5.31 (H) 12/10/2024   HGB 11.9 (L) 12/10/2024   HCT 39.5 12/10/2024   PLT 206 12/10/2024   MCV 74.4 (L) 12/10/2024   MCH 22.4 (L) 12/10/2024   MCHC 30.1 12/10/2024   RDW 19.3 (H) 12/10/2024   LYMPHSABS 1.2 12/10/2024   MONOABS 0.6 12/10/2024   EOSABS 0.1 12/10/2024    BMET Lab Results  Component Value Date   NA 139 12/11/2024   K 4.1 12/11/2024   CL 102 12/11/2024   CO2 26 12/11/2024   GLUCOSE 164 (H) 12/11/2024   BUN 30 (H) 12/11/2024   CREATININE 1.39 (H) 12/11/2024   CALCIUM  8.7 (L) 12/11/2024   GFRNONAA 41 (L) 12/11/2024   GFRAA 51 (L) 01/28/2021  Assessment and Plan 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 No residual cough  Ckd 3 = cr on recent labs at 1.4, stable; continue with ceftriaxone  until completion of treatment "

## 2025-01-01 ENCOUNTER — Ambulatory Visit: Payer: Self-pay | Admitting: Cardiology

## 2025-01-02 ENCOUNTER — Telehealth (HOSPITAL_COMMUNITY): Payer: Self-pay

## 2025-01-02 NOTE — Telephone Encounter (Signed)
 Called to confirm/remind patient of their appointment [facility] on 01/05/25 1:30.   Appointment:   [] Confirmed  [x] Left mess- Notified patient if the office is closed Monday someone will give her a call to reschedule.   [] No answer/No voice mail  [] VM Full/unable to leave message  [] Phone not in service  Patient reminded to bring all medications and/or complete list.  Confirmed patient has transportation. Gave directions, instructed to utilize valet parking.

## 2025-01-04 ENCOUNTER — Telehealth (HOSPITAL_COMMUNITY): Payer: Self-pay

## 2025-01-05 ENCOUNTER — Ambulatory Visit (HOSPITAL_COMMUNITY)

## 2025-01-08 NOTE — Progress Notes (Signed)
 Remote ICD Transmission

## 2025-01-13 ENCOUNTER — Ambulatory Visit (HOSPITAL_COMMUNITY)

## 2025-01-15 ENCOUNTER — Ambulatory Visit: Payer: Self-pay | Admitting: Internal Medicine

## 2025-04-01 ENCOUNTER — Ambulatory Visit

## 2025-07-01 ENCOUNTER — Ambulatory Visit

## 2025-09-30 ENCOUNTER — Ambulatory Visit

## 2025-12-30 ENCOUNTER — Ambulatory Visit

## 2026-03-31 ENCOUNTER — Ambulatory Visit
